# Patient Record
Sex: Female | Born: 1965 | Race: Black or African American | Hispanic: No | Marital: Single | Smoking: Never smoker
Health system: Southern US, Community
[De-identification: ages and names within clinical notes are randomized; demographics above are authoritative.]

## PROBLEM LIST (undated history)

## (undated) DIAGNOSIS — K219 Gastro-esophageal reflux disease without esophagitis: Secondary | ICD-10-CM

## (undated) DIAGNOSIS — I5042 Chronic combined systolic (congestive) and diastolic (congestive) heart failure: Secondary | ICD-10-CM

## (undated) DIAGNOSIS — I824Z9 Acute embolism and thrombosis of unspecified deep veins of unspecified distal lower extremity: Secondary | ICD-10-CM

## (undated) DIAGNOSIS — I428 Other cardiomyopathies: Secondary | ICD-10-CM

## (undated) DIAGNOSIS — J90 Pleural effusion, not elsewhere classified: Secondary | ICD-10-CM

## (undated) DIAGNOSIS — G5601 Carpal tunnel syndrome, right upper limb: Secondary | ICD-10-CM

## (undated) DIAGNOSIS — E785 Hyperlipidemia, unspecified: Secondary | ICD-10-CM

## (undated) DIAGNOSIS — J189 Pneumonia, unspecified organism: Secondary | ICD-10-CM

## (undated) DIAGNOSIS — I2699 Other pulmonary embolism without acute cor pulmonale: Secondary | ICD-10-CM

## (undated) DIAGNOSIS — I10 Essential (primary) hypertension: Secondary | ICD-10-CM

## (undated) DIAGNOSIS — I639 Cerebral infarction, unspecified: Secondary | ICD-10-CM

## (undated) DIAGNOSIS — E119 Type 2 diabetes mellitus without complications: Secondary | ICD-10-CM

## (undated) HISTORY — DX: Other cardiomyopathies: I42.8

## (undated) HISTORY — DX: Pleural effusion, not elsewhere classified: J90

## (undated) HISTORY — DX: Chronic combined systolic (congestive) and diastolic (congestive) heart failure: I50.42

## (undated) HISTORY — PX: LITHOTRIPSY: SUR834

## (undated) HISTORY — DX: Carpal tunnel syndrome, right upper limb: G56.01

---

## 2006-11-25 ENCOUNTER — Emergency Department: Payer: Self-pay | Admitting: Emergency Medicine

## 2007-03-20 ENCOUNTER — Inpatient Hospital Stay: Payer: Self-pay | Admitting: Surgery

## 2007-03-22 ENCOUNTER — Other Ambulatory Visit: Payer: Self-pay

## 2009-02-11 ENCOUNTER — Emergency Department: Payer: Self-pay | Admitting: Internal Medicine

## 2009-03-16 ENCOUNTER — Emergency Department: Payer: Self-pay | Admitting: Emergency Medicine

## 2010-01-13 ENCOUNTER — Emergency Department: Payer: Self-pay | Admitting: Emergency Medicine

## 2011-05-23 ENCOUNTER — Emergency Department: Payer: Self-pay | Admitting: *Deleted

## 2011-11-13 DIAGNOSIS — E669 Obesity, unspecified: Secondary | ICD-10-CM | POA: Insufficient documentation

## 2012-11-10 DIAGNOSIS — E7800 Pure hypercholesterolemia, unspecified: Secondary | ICD-10-CM | POA: Insufficient documentation

## 2012-12-02 ENCOUNTER — Emergency Department: Payer: Self-pay | Admitting: Emergency Medicine

## 2012-12-06 LAB — BETA STREP CULTURE(ARMC)

## 2013-04-25 ENCOUNTER — Emergency Department: Payer: Self-pay | Admitting: Emergency Medicine

## 2013-04-25 LAB — COMPREHENSIVE METABOLIC PANEL
Anion Gap: 20 — ABNORMAL HIGH (ref 7–16)
BUN: 17 mg/dL (ref 7–18)
Bilirubin,Total: 0.8 mg/dL (ref 0.2–1.0)
Chloride: 97 mmol/L — ABNORMAL LOW (ref 98–107)
EGFR (African American): >60
Osmolality: 276 (ref 275–301)
SGOT(AST): 35 U/L (ref 15–37)
Sodium: 129 mmol/L — ABNORMAL LOW (ref 136–145)

## 2013-04-25 LAB — CK TOTAL AND CKMB (NOT AT ARMC): CK, Total: 68 U/L (ref 21–215)

## 2013-04-25 LAB — TROPONIN I: Troponin-I: <0.02 ng/mL

## 2013-04-25 LAB — DRUG SCREEN, URINE
Amphetamines, Ur Screen: NEGATIVE (ref ?–1000)
Barbiturates, Ur Screen: NEGATIVE (ref ?–200)
Cannabinoid 50 Ng, Ur ~~LOC~~: NEGATIVE (ref ?–50)
Cocaine Metabolite,Ur ~~LOC~~: NEGATIVE (ref ?–300)
Methadone, Ur Screen: NEGATIVE (ref ?–300)
Opiate, Ur Screen: NEGATIVE (ref ?–300)
Tricyclic, Ur Screen: NEGATIVE (ref ?–1000)

## 2013-04-25 LAB — URINALYSIS, COMPLETE
Blood: NEGATIVE
Glucose,UR: >=500 mg/dL (ref 0–75)
Hyaline Cast: 1
Nitrite: NEGATIVE
Ph: 5 (ref 4.5–8.0)
RBC,UR: 1 /HPF (ref 0–5)
Squamous Epithelial: 1

## 2013-04-25 LAB — CBC
HCT: 51.2 % — ABNORMAL HIGH (ref 35.0–47.0)
MCHC: 32.5 g/dL (ref 32.0–36.0)
MCV: 88 fL (ref 80–100)
Platelet: 373 10*3/uL (ref 150–440)
RBC: 5.85 10*6/uL — ABNORMAL HIGH (ref 3.80–5.20)
RDW: 12.3 % (ref 11.5–14.5)

## 2013-04-26 ENCOUNTER — Encounter (HOSPITAL_COMMUNITY): Payer: Self-pay | Admitting: Internal Medicine

## 2013-04-26 ENCOUNTER — Inpatient Hospital Stay (HOSPITAL_COMMUNITY)
Admission: EM | Admit: 2013-04-26 | Discharge: 2013-04-29 | DRG: 637 | Disposition: A | Discharge status: Home / Self Care | Payer: Self-pay | Source: Other Acute Inpatient Hospital | Attending: Internal Medicine | Admitting: Internal Medicine

## 2013-04-26 DIAGNOSIS — I10 Essential (primary) hypertension: Secondary | ICD-10-CM | POA: Diagnosis present

## 2013-04-26 DIAGNOSIS — E1010 Type 1 diabetes mellitus with ketoacidosis without coma: Principal | ICD-10-CM | POA: Diagnosis present

## 2013-04-26 DIAGNOSIS — E1110 Type 2 diabetes mellitus with ketoacidosis without coma: Secondary | ICD-10-CM

## 2013-04-26 DIAGNOSIS — Z91199 Patient's noncompliance with other medical treatment and regimen due to unspecified reason: Secondary | ICD-10-CM

## 2013-04-26 DIAGNOSIS — E119 Type 2 diabetes mellitus without complications: Secondary | ICD-10-CM | POA: Diagnosis present

## 2013-04-26 DIAGNOSIS — E872 Acidosis, unspecified: Secondary | ICD-10-CM | POA: Diagnosis present

## 2013-04-26 DIAGNOSIS — E876 Hypokalemia: Secondary | ICD-10-CM | POA: Diagnosis present

## 2013-04-26 DIAGNOSIS — E873 Alkalosis: Secondary | ICD-10-CM | POA: Diagnosis present

## 2013-04-26 DIAGNOSIS — Z9119 Patient's noncompliance with other medical treatment and regimen: Secondary | ICD-10-CM

## 2013-04-26 DIAGNOSIS — D72829 Elevated white blood cell count, unspecified: Secondary | ICD-10-CM | POA: Diagnosis present

## 2013-04-26 DIAGNOSIS — G9340 Encephalopathy, unspecified: Secondary | ICD-10-CM | POA: Diagnosis present

## 2013-04-26 HISTORY — DX: Essential (primary) hypertension: I10

## 2013-04-26 HISTORY — DX: Gastro-esophageal reflux disease without esophagitis: K21.9

## 2013-04-26 HISTORY — DX: Type 2 diabetes mellitus without complications: E11.9

## 2013-04-26 LAB — GLUCOSE, CAPILLARY
Glucose-Capillary: 236 mg/dL — ABNORMAL HIGH (ref 70–99)
Glucose-Capillary: 236 mg/dL — ABNORMAL HIGH (ref 70–99)
Glucose-Capillary: 168 mg/dL — ABNORMAL HIGH (ref 70–99)
Glucose-Capillary: 178 mg/dL — ABNORMAL HIGH (ref 70–99)
Glucose-Capillary: 192 mg/dL — ABNORMAL HIGH (ref 70–99)
Glucose-Capillary: 116 mg/dL — ABNORMAL HIGH (ref 70–99)
Glucose-Capillary: 131 mg/dL — ABNORMAL HIGH (ref 70–99)
Glucose-Capillary: 130 mg/dL — ABNORMAL HIGH (ref 70–99)
Glucose-Capillary: 190 mg/dL — ABNORMAL HIGH (ref 70–99)
Glucose-Capillary: 325 mg/dL — ABNORMAL HIGH (ref 70–99)
Glucose-Capillary: 147 mg/dL — ABNORMAL HIGH (ref 70–99)
Glucose-Capillary: 122 mg/dL — ABNORMAL HIGH (ref 70–99)
Glucose-Capillary: 157 mg/dL — ABNORMAL HIGH (ref 70–99)

## 2013-04-26 LAB — BASIC METABOLIC PANEL
BUN: 17 mg/dL (ref 6–23)
BUN: 18 mg/dL (ref 6–23)
BUN: 17 mg/dL (ref 6–23)
BUN: 17 mg/dL (ref 6–23)
BUN: 16 mg/dL (ref 6–23)
CO2: 11 mEq/L — ABNORMAL LOW (ref 19–32)
CO2: 14 mEq/L — ABNORMAL LOW (ref 19–32)
CO2: 16 mEq/L — ABNORMAL LOW (ref 19–32)
Calcium: 8.5 mg/dL (ref 8.4–10.5)
Calcium: 9.3 mg/dL (ref 8.4–10.5)
Calcium: 8.5 mg/dL (ref 8.4–10.5)
Calcium: 8 mg/dL — ABNORMAL LOW (ref 8.4–10.5)
Calcium: 8.3 mg/dL — ABNORMAL LOW (ref 8.4–10.5)
Chloride: 103 mEq/L (ref 96–112)
Chloride: 106 mEq/L (ref 96–112)
Chloride: 104 mEq/L (ref 96–112)
Chloride: 98 mEq/L (ref 96–112)
Chloride: 98 mEq/L (ref 96–112)
Creatinine, Ser: 0.6 mg/dL (ref 0.50–1.10)
Creatinine, Ser: 0.64 mg/dL (ref 0.50–1.10)
Creatinine, Ser: 0.56 mg/dL (ref 0.50–1.10)
Creatinine, Ser: 0.62 mg/dL (ref 0.50–1.10)
GFR calc Af Amer: >90 mL/min (ref >90)
GFR calc Af Amer: >90 mL/min (ref >90)
GFR calc Af Amer: >90 mL/min (ref >90)
GFR calc Af Amer: >90 mL/min (ref >90)
GFR calc Af Amer: >90 mL/min (ref >90)
GFR calc non Af Amer: >90 mL/min (ref >90)
GFR calc non Af Amer: >90 mL/min (ref >90)
GFR calc non Af Amer: >90 mL/min (ref >90)
GFR calc non Af Amer: >90 mL/min (ref >90)
GFR calc non Af Amer: >90 mL/min (ref >90)
Glucose, Bld: 244 mg/dL — ABNORMAL HIGH (ref 70–99)
Glucose, Bld: 223 mg/dL — ABNORMAL HIGH (ref 70–99)
Glucose, Bld: 120 mg/dL — ABNORMAL HIGH (ref 70–99)
Glucose, Bld: 160 mg/dL — ABNORMAL HIGH (ref 70–99)
Glucose, Bld: 303 mg/dL — ABNORMAL HIGH (ref 70–99)
Potassium: 4.4 mEq/L (ref 3.5–5.1)
Potassium: 5 mEq/L (ref 3.5–5.1)
Potassium: 4.5 mEq/L (ref 3.5–5.1)
Potassium: 4 mEq/L (ref 3.5–5.1)
Sodium: 135 mEq/L (ref 135–145)
Sodium: 134 mEq/L — ABNORMAL LOW (ref 135–145)
Sodium: 131 mEq/L — ABNORMAL LOW (ref 135–145)
Sodium: 127 mEq/L — ABNORMAL LOW (ref 135–145)

## 2013-04-26 LAB — POCT I-STAT 3, VENOUS BLOOD GAS (G3P V)
Acid-base deficit: 15 mmol/L — ABNORMAL HIGH (ref 0.0–2.0)
Bicarbonate: 12.8 mEq/L — ABNORMAL LOW (ref 20.0–24.0)
O2 Saturation: 96 %
Patient temperature: 98.4
TCO2: 14 mmol/L (ref 0–100)
pCO2, Ven: 34.8 mmHg — ABNORMAL LOW (ref 45.0–50.0)
pH, Ven: 7.173 — CL (ref 7.250–7.300)
pO2, Ven: 105 mmHg — ABNORMAL HIGH (ref 30.0–45.0)

## 2013-04-26 LAB — TROPONIN I: Troponin I: <0.3 ng/mL (ref <0.30)

## 2013-04-26 LAB — CBC
HCT: 42 % (ref 36.0–46.0)
Hemoglobin: 14.5 g/dL (ref 12.0–15.0)
MCH: 29.2 pg (ref 26.0–34.0)
MCHC: 34.5 g/dL (ref 30.0–36.0)
MCV: 84.5 fL (ref 78.0–100.0)
RBC: 4.97 MIL/uL (ref 3.87–5.11)

## 2013-04-26 LAB — MRSA PCR SCREENING: MRSA by PCR: NEGATIVE

## 2013-04-26 MED ORDER — INFLUENZA VAC SPLIT QUAD 0.5 ML IM SUSP
0.5000 mL | INTRAMUSCULAR | Status: AC
  Administered 2013-04-29: 0.5 mL via INTRAMUSCULAR
  Filled 2013-04-26: qty 0.5

## 2013-04-26 MED ORDER — POTASSIUM CHLORIDE CRYS ER 10 MEQ PO TBCR
EXTENDED_RELEASE_TABLET | ORAL | Status: AC
  Administered 2013-04-26: 40 meq
  Filled 2013-04-26: qty 4

## 2013-04-26 MED ORDER — DEXTROSE 50 % IV SOLN: 25.0000 mL | INTRAVENOUS | Status: DC | PRN

## 2013-04-26 MED ORDER — DEXTROSE 5 % IV SOLN
INTRAVENOUS | Status: DC
  Administered 2013-04-27 (×2): via INTRAVENOUS

## 2013-04-26 MED ORDER — SODIUM CHLORIDE 0.9 % IV SOLN: INTRAVENOUS | Status: DC

## 2013-04-26 MED ORDER — BIOTENE DRY MOUTH MT LIQD
15.0000 mL | Freq: Two times a day (BID) | OROMUCOSAL | Status: DC
  Administered 2013-04-26 – 2013-04-27 (×3): 15 mL via OROMUCOSAL

## 2013-04-26 MED ORDER — POTASSIUM CHLORIDE CRYS ER 10 MEQ PO TBCR
EXTENDED_RELEASE_TABLET | ORAL | Status: AC
  Filled 2013-04-26: qty 4

## 2013-04-26 MED ORDER — SODIUM CHLORIDE 0.9 % IV SOLN
INTRAVENOUS | Status: DC
  Administered 2013-04-26: 1.4 [IU]/h via INTRAVENOUS
  Administered 2013-04-26: 0.8 [IU]/h via INTRAVENOUS
  Administered 2013-04-26: 1.4 [IU]/h via INTRAVENOUS
  Administered 2013-04-26: 0.9 [IU]/h via INTRAVENOUS
  Administered 2013-04-27: 3.3 [IU]/h via INTRAVENOUS
  Administered 2013-04-27: 3.8 [IU]/h via INTRAVENOUS
  Administered 2013-04-27: 1.3 [IU]/h via INTRAVENOUS
  Administered 2013-04-27: 4.4 [IU]/h via INTRAVENOUS
  Administered 2013-04-27: 0.9 [IU]/h via INTRAVENOUS
  Administered 2013-04-27: 0.5 [IU]/h via INTRAVENOUS
  Filled 2013-04-26: qty 1

## 2013-04-26 MED ORDER — INSULIN GLARGINE 100 UNIT/ML ~~LOC~~ SOLN
10.0000 [IU] | Freq: Every day | SUBCUTANEOUS | Status: DC
  Administered 2013-04-26: 10 [IU] via SUBCUTANEOUS
  Filled 2013-04-26: qty 0.1

## 2013-04-26 MED ORDER — POTASSIUM CHLORIDE CRYS ER 20 MEQ PO TBCR
40.0000 meq | EXTENDED_RELEASE_TABLET | Freq: Once | ORAL | Status: DC
  Filled 2013-04-26: qty 2

## 2013-04-26 MED ORDER — SODIUM CHLORIDE 0.45 % IV SOLN
INTRAVENOUS | Status: DC
  Administered 2013-04-26: 12:00:00 via INTRAVENOUS

## 2013-04-26 MED ORDER — PNEUMOCOCCAL VAC POLYVALENT 25 MCG/0.5ML IJ INJ
0.5000 mL | INJECTION | INTRAMUSCULAR | Status: AC
  Administered 2013-04-29: 0.5 mL via INTRAMUSCULAR
  Filled 2013-04-26: qty 0.5

## 2013-04-26 MED ORDER — HYDRALAZINE HCL 20 MG/ML IJ SOLN: 5.0000 mg | Freq: Four times a day (QID) | INTRAMUSCULAR | Status: DC | PRN

## 2013-04-26 MED ORDER — DEXTROSE-NACL 5-0.45 % IV SOLN
INTRAVENOUS | Status: DC
  Administered 2013-04-26: 75 mL/h via INTRAVENOUS

## 2013-04-26 MED ORDER — LIVING WELL WITH DIABETES BOOK
Freq: Once | Status: AC
  Administered 2013-04-26: 13:00:00
  Filled 2013-04-26: qty 1

## 2013-04-26 MED ORDER — CHLORHEXIDINE GLUCONATE 0.12 % MT SOLN
15.0000 mL | Freq: Two times a day (BID) | OROMUCOSAL | Status: DC
  Administered 2013-04-26 – 2013-04-27 (×3): 15 mL via OROMUCOSAL
  Filled 2013-04-26 (×3): qty 15

## 2013-04-26 MED ORDER — HEPARIN SODIUM (PORCINE) 5000 UNIT/ML IJ SOLN
5000.0000 [IU] | Freq: Three times a day (TID) | INTRAMUSCULAR | Status: DC
  Administered 2013-04-26 – 2013-04-29 (×10): 5000 [IU] via SUBCUTANEOUS
  Filled 2013-04-26 (×13): qty 1

## 2013-04-26 MED ORDER — INSULIN ASPART 100 UNIT/ML ~~LOC~~ SOLN
0.0000 [IU] | SUBCUTANEOUS | Status: DC
  Administered 2013-04-26: 2 [IU] via SUBCUTANEOUS
  Administered 2013-04-26: 11 [IU] via SUBCUTANEOUS

## 2013-04-26 NOTE — Progress Notes (Signed)
CBG increased to 325 mg/dl for 1610. Given Novalog 11 units SQ for coverage per orders. Called results of 1600 BMET and CBG to Dr. Marin Shutter. Orders received. Insulin drip restarted.

## 2013-04-26 NOTE — Progress Notes (Signed)
eLink Physician-Brief Progress Note Patient Name: Minaal Struckman DOB: 1965-08-12 MRN: [id number]  Date of Service  04/26/2013   HPI/Events of Note   BG>300 Bicarb 15 Suspect continuing ketosis   eICU Interventions   Back in insulin gtt CBG q1 BMP q4 Send ketones before transitioning    Intervention Category Major Interventions: Hyperglycemia - active titration of insulin therapy  Emric Kowalewski 04/26/2013, 5:07 PM

## 2013-04-26 NOTE — H&P (Signed)
Name: Erin Hayes MRN: [id number] DOB: 06-Feb-1966    LOS: 0  Referring Provider:  [facility] Reason for Referral:  DKA  PULMONARY / CRITICAL CARE MEDICINE  BRIEF:  Ms. Erin Hayes is a 47 y/o woman with past medical history of diabetes and hypertension who presented to [facility] hospital with a headache.  She had runout of her medications including antihypertensives and insulin.  She was treated with morphine, benadryl and toradol for headache when it was noted that her blood sugar was 370 and her anion gap was 20.  Her venous pH at that time was 7.12.  She was then started on an insulin drip and transferred to [facility] hospital as no ICU beds were available at [facility].    SUBJECTIVE/OVERNIGHT/INTERVAL HX  04/26/13: Transitioned to Lantus. INsulin gtt to run out in few hours.     Physical Examination: General:  Small woman laying in bed in NAD Neuro:  Alert, oriented to person, somnolent but awakens to voice HEENT:  PERRL, EOMI, OP clear Neck:  No lesions Cardiovascular:  NRRR, no mrg Lungs:  CTAB, no wrr Abdomen:   Soft, NTND, +BS Musculoskeletal:  No joint abnormalities Skin:  No lesions   LABS: PULMONARY  Recent Labs Lab 04/26/13 0253  HCO3 12.8*  TCO2 14  O2SAT 96.0    CBC  Recent Labs Lab 04/26/13 0214  HGB 14.5  HCT 42.0  WBC 19.3*  PLT 171    COAGULATION No results found for this basename: INR,  in the last 168 hours  CARDIAC  No results found for this basename: TROPONINI,  in the last 168 hours No results found for this basename: PROBNP,  in the last 168 hours   CHEMISTRY  Recent Labs Lab 04/26/13 0214 04/26/13 0448 04/26/13 0925 04/26/13 1230  NA 134* 135 134* 131*  K 4.4 4.3 5.0 4.5  CL 103 106 104 104  CO2 11* 14* 16* 11*  GLUCOSE 244* 223* 120* 160*  BUN 17 18 17 17   CREATININE 0.60 0.64 0.55 0.55  CALCIUM 8.4 8.5 9.3 8.5   Estimated Creatinine Clearance: 58 ml/min (by C-G formula based on Cr of 0.55).   LIVER No  results found for this basename: AST, ALT, ALKPHOS, BILITOT, PROT, ALBUMIN, INR,  in the last 168 hours   INFECTIOUS No results found for this basename: LATICACIDVEN, PROCALCITON,  in the last 168 hours   ENDOCRINE CBG (last 3)   Recent Labs  04/26/13 0855 04/26/13 0956 04/26/13 1100  GLUCAP 116* 127* 131*         IMAGING x48h  No results found.     ASSESSMENT / PLAN:  PULMONARY A: no distress P:   Monitor IS  CARDIOVASCULAR A: AT risk for NSTEMI P:  Cycle enzumes  RENAL A:  At risk for renal faire P:   monitor  GASTROINTESTINAL A:  Ready to eat per DKA protocol P:   Carb modified diet  HEMATOLOGIC A:  Normal P:  monitor  INFECTIOUS A:  No infection P:   monitor  ENDOCRINE A:  DKA severe needing ICU admit due to lack of meds due to $ issues 04/26/13  - improved   P:   DKA protocol ; transition to sq Social work consult  NEUROLOGIC A:  Acute enceph on arrival 12.21.14  normal P:   monitor  SOCIAL : disabled son. Daughter works. She has no $. Social work conslt called     Dr. Kalman Shan, M.D., F.C.C.P Pulmonary and Critical Care Medicine  Staff Physician [facility]  Pulmonary and Critical Care Pager: [phone], If no answer or between  15:00h - 7:00h: call [phone]  04/26/2013 1:52 PM

## 2013-04-26 NOTE — Progress Notes (Signed)
Attempted to complete Pt H&P however Pt very obtunded and difficult to maintain awake, follows commands after many prompts, CBG's trending down, will continue monitor and assess.

## 2013-04-26 NOTE — Plan of Care (Signed)
Problem: Phase I Progression Outcomes Goal: CBGs steadily decreasing on IV insulin drip Outcome: Progressing Progressing earlier and transitioned off drip. Now up to 325. Calling MD to see if they want to change orders. Treated with SQ insulin Goal: Pt. states reason for hospitalization Outcome: Completed/Met Date Met:  04/26/13 Could not afford medication. Chose to pay rent. Social work and care management consults made Goal: Order "Choose to Live" book from Pharmacy Outcome: Completed/Met Date Met:  04/26/13 Living with Diabetes booklet given to patient

## 2013-04-26 NOTE — Progress Notes (Signed)
Dr. Tyson Alias notified of 0930 BMET results. Orders received.

## 2013-04-26 NOTE — Progress Notes (Signed)
Pt admitted from [facility] via [facility] transporters on stretcher w/  monitor and RN, after Pt report received from Thurston Hole, RN at [facility] ED; Pt assisted to hospital bed and attached to unit monitors; Pt introduced to staff and unit, MRSA swab done and 6 cloth CHG bath complete. Questions answered and support give,CCM MD at bedside, updated on current CBG reading and obtunded mentation. Will continue to monitor and assess.

## 2013-04-26 NOTE — H&P (Signed)
Name: Erin Hayes MRN: [id number] DOB: 15-Sep-1965    LOS: 0  Referring Provider:  [facility] Reason for Referral:  DKA  PULMONARY / CRITICAL CARE MEDICINE  HPI:  Erin Hayes is a 47 y/o woman with past medical history of diabetes and hypertension who presented to [facility] hospital with a headache.  She had run out of her medications including antihypertensives and insulin.  She was treated with morphine, benadryl and toradol for headache when it was noted that her blood sugar was 370 and her anion gap was 20.  Her venous pH at that time was 7.12.  She was then started on an insulin drip and transferred to [facility] hospital as no ICU beds were available at [facility].   Past Medical History  Diagnosis Date  . Diabetes   . Hypertension    No past surgical history on file. Prior to Admission medications   Not on File   Allergies No Known Allergies  Family History Pt unable to provide family history at this time due to altered mental status  Social History Pt unable to provide social history at this time due to altered mental status  Review Of Systems:  Denies any recent history of fever, chills, n/v/d or cough  Brief patient description:  47 y/o with DKA secondary to no medications  Events Since Admission: Started on glucose stabilizer  Current Status: guarded  Vital Signs: HR 109 RR 22 BP 147/85 T 37  Physical Examination: General:  Small woman laying in bed in NAD Neuro:  Alert, oriented to person, somnolent but awakens to voice HEENT:  PERRL, EOMI, OP clear Neck:  No lesions Cardiovascular:  NRRR, no mrg Lungs:  CTAB, no wrr Abdomen:   Soft, NTND, +BS Musculoskeletal:  No joint abnormalities Skin:  No lesions  Principal Problem:   DKA (diabetic ketoacidoses)   ASSESSMENT AND PLAN  PULMONARY No results found for this basename: PHART, PCO2, PCO2ART, PO2ART, HCO3, O2SAT,  in the last 168 hours Ventilator Settings:   CXR - no infiltrate or evidence  of other acute pulmonary process  A:  Metabolic acidosis with compensatory respiratory alkalosis P:   Continuous O2 sat Repeat VBG on admission to follow pH  CARDIOVASCULAR No results found for this basename: TROPONINI, LATICACIDVEN,  O2SATVEN, PROBNP,  in the last 168 hours ECG:  NSR Lines: PIV  A: No current problems P:  Continuous telemetry  RENAL No results found for this basename: NA, K, CL, CO2, BUN, CREATININE, CALCIUM, MG, PHOS,  in the last 168 hours Intake/Output   None     A:  Hypertension P:   Pt on antihypertensives at home but has been out for over a month Hydralizine for SBP>180 Restart oral meds when taking PO  GASTROINTESTINAL No results found for this basename: AST, ALT, ALKPHOS, BILITOT, PROT, ALBUMIN,  in the last 168 hours  A:  NPO P:   NPO until off insulin drip  HEMATOLOGIC No results found for this basename: HGB, HCT, PLT, INR, APTT,  in the last 168 hours A:  At risk for DVT P:  Prophylactic heparin, SCDs  INFECTIOUS No results found for this basename: WBC, PROCALCITON,  in the last 168 hours Cultures: none Antibiotics: none  A:  No signs or symptoms of infection at this time P:   Monitor for fever and increased WBC count  ENDOCRINE No results found for this basename: GLUCAP,  in the last 168 hours A:  DKA   P:   Insulin drip  per protocol NS at 75/hr while glucose greater than 250 D51/2 NS when glucose less than 250 q2hr BMP  NEUROLOGIC  A:  AMS P:   S/p head CT normal  Treated with multiple doses morphine and benadryl, AMS likely due to DKA and medications  BEST PRACTICE / DISPOSITION Level of Care:  ICU Primary Service:  PCCM Consultants:  none Code Status:  full Diet:  NPO DVT Px:  heparin GI Px:  Not indicated  Skin Integrity:  good Social / Family:  None at bedside  I spent 35 minutes of critical care time in the care of this patient seperate from procedures which are documented elsewhere   Carolan Clines., M.D. Pulmonary and Critical Care Medicine [facility] Pager: [phone]  04/26/2013, 1:50 AM

## 2013-04-26 NOTE — Progress Notes (Signed)
CRITICAL VALUE ALERT  Critical value received pH 7.17  Date of notification: t  Time of notification: 84  Critical value read back:no, result off I-stat test  Nurse who received alert: Aura Camps  MD notified (1st page):  Dr Craige Cotta  Time of first page:  0258  MD notified (2nd page):  Time of second page:  Responding MD:  Dr Craige Cotta  Time MD responded:  [phone]

## 2013-04-27 ENCOUNTER — Encounter (HOSPITAL_COMMUNITY): Payer: Self-pay | Admitting: Certified Registered Nurse Anesthetist

## 2013-04-27 DIAGNOSIS — E119 Type 2 diabetes mellitus without complications: Secondary | ICD-10-CM

## 2013-04-27 LAB — BASIC METABOLIC PANEL
BUN: 11 mg/dL (ref 6–23)
BUN: 10 mg/dL (ref 6–23)
BUN: 14 mg/dL (ref 6–23)
CO2: 19 mEq/L (ref 19–32)
CO2: 16 mEq/L — ABNORMAL LOW (ref 19–32)
CO2: 18 mEq/L — ABNORMAL LOW (ref 19–32)
Calcium: 8.9 mg/dL (ref 8.4–10.5)
Calcium: 8.7 mg/dL (ref 8.4–10.5)
Chloride: 100 mEq/L (ref 96–112)
Chloride: 100 mEq/L (ref 96–112)
Chloride: 96 mEq/L (ref 96–112)
Creatinine, Ser: 0.64 mg/dL (ref 0.50–1.10)
Creatinine, Ser: 0.56 mg/dL (ref 0.50–1.10)
Creatinine, Ser: 0.87 mg/dL (ref 0.50–1.10)
GFR calc Af Amer: >90 mL/min (ref >90)
GFR calc Af Amer: >90 mL/min (ref >90)
GFR calc Af Amer: 89 mL/min — ABNORMAL LOW (ref >90)
GFR calc non Af Amer: >90 mL/min (ref >90)
GFR calc non Af Amer: >90 mL/min (ref >90)
GFR calc non Af Amer: 77 mL/min — ABNORMAL LOW (ref >90)
Glucose, Bld: 107 mg/dL — ABNORMAL HIGH (ref 70–99)
Glucose, Bld: 162 mg/dL — ABNORMAL HIGH (ref 70–99)
Potassium: 3.4 mEq/L — ABNORMAL LOW (ref 3.5–5.1)
Potassium: 3.6 mEq/L (ref 3.5–5.1)
Sodium: 129 mEq/L — ABNORMAL LOW (ref 135–145)
Sodium: 127 mEq/L — ABNORMAL LOW (ref 135–145)

## 2013-04-27 LAB — PHOSPHORUS: Phosphorus: 1.6 mg/dL — ABNORMAL LOW (ref 2.3–4.6)

## 2013-04-27 LAB — GLUCOSE, CAPILLARY
Glucose-Capillary: 127 mg/dL — ABNORMAL HIGH (ref 70–99)
Glucose-Capillary: 107 mg/dL — ABNORMAL HIGH (ref 70–99)
Glucose-Capillary: 149 mg/dL — ABNORMAL HIGH (ref 70–99)
Glucose-Capillary: 161 mg/dL — ABNORMAL HIGH (ref 70–99)
Glucose-Capillary: 234 mg/dL — ABNORMAL HIGH (ref 70–99)
Glucose-Capillary: 128 mg/dL — ABNORMAL HIGH (ref 70–99)
Glucose-Capillary: 238 mg/dL — ABNORMAL HIGH (ref 70–99)

## 2013-04-27 LAB — TROPONIN I: Troponin I: <0.3 ng/mL (ref <0.30)

## 2013-04-27 LAB — MAGNESIUM: Magnesium: 1.6 mg/dL (ref 1.5–2.5)

## 2013-04-27 MED ORDER — INSULIN ASPART PROT & ASPART (70-30 MIX) 100 UNIT/ML ~~LOC~~ SUSP
35.0000 [IU] | Freq: Two times a day (BID) | SUBCUTANEOUS | Status: DC
  Administered 2013-04-27 – 2013-04-29 (×5): 35 [IU] via SUBCUTANEOUS
  Filled 2013-04-27: qty 10

## 2013-04-27 MED ORDER — INSULIN GLARGINE 100 UNIT/ML ~~LOC~~ SOLN
35.0000 [IU] | Freq: Two times a day (BID) | SUBCUTANEOUS | Status: DC
  Administered 2013-04-27: 35 [IU] via SUBCUTANEOUS
  Filled 2013-04-27 (×2): qty 0.35

## 2013-04-27 MED ORDER — INSULIN ASPART 100 UNIT/ML ~~LOC~~ SOLN
10.0000 [IU] | Freq: Three times a day (TID) | SUBCUTANEOUS | Status: DC
  Administered 2013-04-27: 10 [IU] via SUBCUTANEOUS

## 2013-04-27 MED ORDER — INSULIN ASPART 100 UNIT/ML ~~LOC~~ SOLN
0.0000 [IU] | SUBCUTANEOUS | Status: DC
Start: 2013-04-27 — End: 2013-04-28
  Administered 2013-04-27: 3 [IU] via SUBCUTANEOUS
  Administered 2013-04-27: 7 [IU] via SUBCUTANEOUS
  Administered 2013-04-27: 11 [IU] via SUBCUTANEOUS
  Administered 2013-04-28: 3 [IU] via SUBCUTANEOUS
  Administered 2013-04-28: 7 [IU] via SUBCUTANEOUS
  Administered 2013-04-28: 15 [IU] via SUBCUTANEOUS

## 2013-04-27 NOTE — Progress Notes (Signed)
Pt transferred to 6N08 via wheelchair. Pt on RA, no telemetry. A/O x 4. No complaints per pt. Pt belongings bag transferred with pt containing pt's clothes. Pt's daughter aware of transfer. Bedside handoff with Lillia Abed, RN. Will continue to monitor closely. Kerin Ransom, RN

## 2013-04-27 NOTE — Progress Notes (Signed)
Nutrition Brief Note  Patient identified on the Malnutrition Screening Tool (MST) Report for recent weight lost without trying.  Patient reports her weight loss has been due to a combination of lack of DM medications as well as her actively trying to lose weight.  Wt Readings from Last 15 Encounters:  04/27/13 101 lb 1.6 oz (45.859 kg)    Body mass index is 20.41 kg/(m^2). Patient meets criteria for Normal based on current BMI.   Current diet order is Carbohydrate Modified Medium Calorie, patient is consuming approximately 90% of meals at this time. Labs and medications reviewed.   No nutrition interventions warranted at this time. If nutrition issues arise, please consult RD.   Maureen Chatters, RD, LDN Pager #: [phone] After-Hours Pager #: [phone]

## 2013-04-27 NOTE — Progress Notes (Signed)
Inpatient Diabetes Program Recommendations  AACE/ADA: New Consensus Statement on Inpatient Glycemic Control (2013)  Target Ranges:  Prepandial:   less than 140 mg/dL      Peak postprandial:   less than 180 mg/dL (1-2 hours)      Critically ill patients:  140 - 180 mg/dL   Reason for Assessment: DKA Admission.  Question about Lantus order.  Note: Telephone call to Dr. Shan Levans regarding Lantus order.  Dr. Delford Field decided to stop the Lantus 35 unit BID order and Novolog 10 unit tid meal coverage order and put patient on 70/30 35 units bid with breakfast and supper as she was supposed to be taking prior to admission.  Orders changed per Dr. Lynelle Doctor request to reflect this change in treatment.  Thank you.  Khriz Liddy S. Elsie Lincoln, RN, CNS, CDE Inpatient Diabetes Program, team pager [phone]

## 2013-04-27 NOTE — Progress Notes (Signed)
CSW received consult for neglect and assistance with insulin. CSW spoke to RN, who stated patient needs medication assistance and is not able to take care of herself medically, CSW passed this information on to the Case Manager who will follow up with medication assistance. CSW signing off at this time. Please re consult if social work needs arise.  Maree Krabbe, MSW, Theresia Majors [phone]

## 2013-04-27 NOTE — H&P (Addendum)
Name: Erin Hayes MRN: [id number] DOB: Oct 05, 1965    LOS: 1  Referring Provider:  [facility] Reason for Referral:  DKA  PULMONARY / CRITICAL CARE MEDICINE  BRIEF:  [name] is a 47 y/o woman with past medical history of diabetes and hypertension who presented to [facility] with a headache.  She had runout of her medications including antihypertensives and insulin.  She was treated with morphine, benadryl and toradol for headache when it was noted that her blood sugar was 370 and her anion gap was 20.  Her venous pH at that time was 7.12.  She was then started on an insulin drip and transferred to [facility] hospital as no ICU beds were available at [facility].    SUBJECTIVE/OVERNIGHT/INTERVAL HX improved   Physical Examination: General:  Small woman laying in bed in NAD Neuro:  Alert, oriented to person, somnolent but awakens to voice HEENT:  PERRL, EOMI, OP clear Neck:  No lesions Cardiovascular:  NRRR, no mrg Lungs:  CTAB, no wrr Abdomen:   Soft, NTND, +BS Musculoskeletal:  No joint abnormalities Skin:  No lesions   LABS: PULMONARY  Recent Labs Lab 04/26/13 0253  HCO3 12.8*  TCO2 14  O2SAT 96.0    CBC  Recent Labs Lab 04/26/13 0214  HGB 14.5  HCT 42.0  WBC 19.3*  PLT 171    COAGULATION No results found for this basename: INR,  in the last 168 hours  CARDIAC    Recent Labs Lab 04/26/13 1543 04/26/13 2200 04/27/13 0400  TROPONINI <0.30 <0.30 <0.30   No results found for this basename: PROBNP,  in the last 168 hours   CHEMISTRY  Recent Labs Lab 04/26/13 1542 04/26/13 1920 04/26/13 2200 04/27/13 0400 04/27/13 0830  NA 125* 126* 127* 129* 127*  K 4.0 3.1* 3.4* 3.4* 3.6  CL 98 98 98 100 100  CO2 15* 17* 17* 19 16*  GLUCOSE 303* 174* 162* 107* 162*  BUN 17 16 14 11 10   CREATININE 0.56 0.62 0.64 0.64 0.56  CALCIUM 7.9* 8.0* 8.3* 8.9 8.7  MG  --   --   --  1.6  --   PHOS  --   --   --  1.6*  --    Estimated Creatinine  Clearance: 58 ml/min (by C-G formula based on Cr of 0.56).   LIVER No results found for this basename: AST, ALT, ALKPHOS, BILITOT, PROT, ALBUMIN, INR,  in the last 168 hours   INFECTIOUS No results found for this basename: LATICACIDVEN, PROCALCITON,  in the last 168 hours   ENDOCRINE CBG (last 3)   Recent Labs  04/26/13 2121 04/26/13 2213 04/26/13 2311  GLUCAP 122* 157* 196*         IMAGING x48h  No results found.     ASSESSMENT / PLAN:  PULMONARY A: no distress P:   Monitor IS  CARDIOVASCULAR A: AT risk for NSTEMI P:  Neg enzymes  RENAL A:  At risk for renal faire P:   monitor  GASTROINTESTINAL A:  Ready to eat per DKA protocol P:   Carb modified diet  HEMATOLOGIC A:  Normal P:  monitor  INFECTIOUS A:  No infection P:   monitor  ENDOCRINE A:  DKA severe needing ICU admit due to lack of meds due to $ issues 04/26/13  -resolved  P:   DKA protocol ; transition to sq Social work consult  NEUROLOGIC A:  Acute enceph on arrival 12.21.14  normal P:   monitor  SOCIAL : disabled son. Daughter works. She has no $. Social work Arts administrator to floor.  [facility] to assume care in AM 12/23   Erin Hayes  Beeper  [phone]  Cell  [phone]  If no response or cell goes to voicemail, call beeper [phone]   04/27/2013 9:25 AM

## 2013-04-28 DIAGNOSIS — D72829 Elevated white blood cell count, unspecified: Secondary | ICD-10-CM

## 2013-04-28 DIAGNOSIS — E876 Hypokalemia: Secondary | ICD-10-CM

## 2013-04-28 DIAGNOSIS — G9340 Encephalopathy, unspecified: Secondary | ICD-10-CM

## 2013-04-28 LAB — CBC WITH DIFFERENTIAL/PLATELET
Basophils Relative: 1 % (ref 0–1)
Eosinophils Absolute: 0.1 10*3/uL (ref 0.0–0.7)
Eosinophils Relative: 1 % (ref 0–5)
Hemoglobin: 14.4 g/dL (ref 12.0–15.0)
Lymphs Abs: 2.4 10*3/uL (ref 0.7–4.0)
MCH: 29 pg (ref 26.0–34.0)
MCHC: 35 g/dL (ref 30.0–36.0)
MCV: 82.9 fL (ref 78.0–100.0)
Monocytes Relative: 10 % (ref 3–12)
Neutro Abs: 3.5 10*3/uL (ref 1.7–7.7)
Neutrophils Relative %: 53 % (ref 43–77)
RBC: 4.96 MIL/uL (ref 3.87–5.11)
RDW: 12.2 % (ref 11.5–15.5)
WBC: 6.6 10*3/uL (ref 4.0–10.5)

## 2013-04-28 LAB — GLUCOSE, CAPILLARY
Glucose-Capillary: 105 mg/dL — ABNORMAL HIGH (ref 70–99)
Glucose-Capillary: 149 mg/dL — ABNORMAL HIGH (ref 70–99)
Glucose-Capillary: 210 mg/dL — ABNORMAL HIGH (ref 70–99)
Glucose-Capillary: 317 mg/dL — ABNORMAL HIGH (ref 70–99)

## 2013-04-28 LAB — BASIC METABOLIC PANEL
BUN: 12 mg/dL (ref 6–23)
CO2: 21 mEq/L (ref 19–32)
Calcium: 8.9 mg/dL (ref 8.4–10.5)
Creatinine, Ser: 0.69 mg/dL (ref 0.50–1.10)
GFR calc non Af Amer: >90 mL/min (ref >90)
Glucose, Bld: 98 mg/dL (ref 70–99)
Potassium: 3 mEq/L — ABNORMAL LOW (ref 3.5–5.1)
Sodium: 136 mEq/L (ref 135–145)

## 2013-04-28 MED ORDER — LISINOPRIL 20 MG PO TABS
20.0000 mg | ORAL_TABLET | Freq: Every day | ORAL | Status: DC
  Administered 2013-04-28 – 2013-04-29 (×2): 20 mg via ORAL
  Filled 2013-04-28 (×2): qty 1

## 2013-04-28 MED ORDER — POTASSIUM CHLORIDE CRYS ER 20 MEQ PO TBCR
40.0000 meq | EXTENDED_RELEASE_TABLET | ORAL | Status: AC
  Administered 2013-04-28 (×2): 40 meq via ORAL
  Filled 2013-04-28 (×2): qty 2

## 2013-04-28 MED ORDER — INSULIN ASPART 100 UNIT/ML ~~LOC~~ SOLN
0.0000 [IU] | Freq: Three times a day (TID) | SUBCUTANEOUS | Status: DC
  Administered 2013-04-29 (×2): 5 [IU] via SUBCUTANEOUS

## 2013-04-28 MED ORDER — INSULIN ASPART 100 UNIT/ML ~~LOC~~ SOLN
0.0000 [IU] | Freq: Every day | SUBCUTANEOUS | Status: DC
Start: 2013-04-28 — End: 2013-04-29

## 2013-04-28 MED ORDER — MAGNESIUM SULFATE 50 % IJ SOLN
3.0000 g | Freq: Once | INTRAVENOUS | Status: AC
  Administered 2013-04-28: 3 g via INTRAVENOUS
  Filled 2013-04-28: qty 6

## 2013-04-28 NOTE — Progress Notes (Signed)
[facility] PROGRESS NOTE  Erin Hayes ZOX:[id number] DOB: 06/18/1965 DOA: 04/26/2013 PCP: No PCP Per Patient  Assessment/Plan: #1 diabetic ketoacidosis Likely secondary to patient being unable to get her medications secondary to insurance issues. Anion gap is closed. Patient has been transitioned to subcutaneous insulin. Clinically improved.  Normal saline lock IV fluids. Sliding scale insulin. Social work consulted to help patient be able to get her medications. Follow.  #2 hypokalemia Replete.  #3 uncontrolled diabetes mellitus Hemoglobin A1c is 18.3. Likely secondary to noncompliance as patient states was unable to get her medications secondary to insurance issues. Diabetic coordinator has been consulted and is following. Continue 70/30. Sliding scale insulin. Social work has been consulted to aid patient with medications.  #4 leukocytosis Likely reactive in nature. Resolved.  #5 hypertension Resume lisinopril. HCTZ on hold.  #5 acute encephalopathy Likely secondary to problem #1. Resolved.  Code Status: full Family Communication: updated patient at bedside. No family present. Disposition Plan: home when medically stable hopefully tomorrow   Consultants:  PCCM  Procedures:  none  Antibiotics:  none  HPI/Subjective: Patient denies any nausea, no emesis. Patient tolerating current diet. No complaints.  Objective: Filed Vitals:   04/28/13 1343  BP: 121/85  Pulse: 90  Temp: 98.7 F (37.1 C)  Resp: 18    Intake/Output Summary (Last 24 hours) at 04/28/13 1915 Last data filed at 04/28/13 1343  Gross per 24 hour  Intake   1390 ml  Output   1150 ml  Net    240 ml   Filed Weights   04/26/13 0145 04/26/13 1254 04/27/13 0400  Weight: 43.7 kg (96 lb 5.5 oz) 43.7 kg (96 lb 5.5 oz) 45.859 kg (101 lb 1.6 oz)    Exam:   General:  NAD  Cardiovascular: RRR  Respiratory: CTAB  Abdomen: soft, nontender, nondistended, positive bowel  sounds.  Musculoskeletal: no clubbing cyanosis or edema.  Data Reviewed: Basic Metabolic Panel:  Recent Labs Lab 04/26/13 2200 04/27/13 0400 04/27/13 0830 04/27/13 1940 04/28/13 0816  NA 127* 129* 127* 128* 136  K 3.4* 3.4* 3.6 3.2* 3.0*  CL 98 100 100 96 103  CO2 17* 19 16* 18* 21  GLUCOSE 162* 107* 162* 238* 98  BUN 14 11 10 14 12   CREATININE 0.64 0.64 0.56 0.87 0.69  CALCIUM 8.3* 8.9 8.7 8.4 8.9  MG  --  1.6  --   --  1.7  PHOS  --  1.6*  --   --  3.6   Liver Function Tests: No results found for this basename: AST, ALT, ALKPHOS, BILITOT, PROT, ALBUMIN,  in the last 168 hours No results found for this basename: LIPASE, AMYLASE,  in the last 168 hours No results found for this basename: AMMONIA,  in the last 168 hours CBC:  Recent Labs Lab 04/26/13 0214 04/28/13 0816  WBC 19.3* 6.6  NEUTROABS  --  3.5  HGB 14.5 14.4  HCT 42.0 41.1  MCV 84.5 82.9  PLT 171 202   Cardiac Enzymes:  Recent Labs Lab 04/26/13 1543 04/26/13 2200 04/27/13 0400  TROPONINI <0.30 <0.30 <0.30   BNP (last 3 results) No results found for this basename: PROBNP,  in the last 8760 hours CBG:  Recent Labs Lab 04/28/13 0011 04/28/13 0408 04/28/13 0749 04/28/13 1115 04/28/13 1631  GLUCAP 210* 105* 97 317* 125*    Recent Results (from the past 240 hour(s))  MRSA PCR SCREENING     Status: None   Collection Time    04/26/13  1:51 AM      Result Value Range Status   MRSA by PCR NEGATIVE  NEGATIVE Final   Comment:            The GeneXpert MRSA Assay (FDA     approved for NASAL specimens     only), is one component of a     comprehensive MRSA colonization     surveillance program. It is not     intended to diagnose MRSA     infection nor to guide or     monitor treatment for     MRSA infections.     Studies: No results found.  Scheduled Meds: . heparin  5,000 Units Subcutaneous Q8H  . influenza vac split quadrivalent PF  0.5 mL Intramuscular Tomorrow-1000  . [START ON  04/29/2013] insulin aspart  0-15 Units Subcutaneous TID WC  . insulin aspart  0-5 Units Subcutaneous QHS  . insulin aspart protamine- aspart  35 Units Subcutaneous BID WC  . pneumococcal 23 valent vaccine  0.5 mL Intramuscular Tomorrow-1000  . potassium chloride  40 mEq Oral Once   Continuous Infusions:   Principal Problem:   DKA (diabetic ketoacidoses) Active Problems:   Diabetes   Hypertension   Hypokalemia   Leukocytosis, unspecified   Encephalopathy    Time spent: 30 minutes    THOMPSON,DANIEL M.D. [facility] Pager [phone]. If 7PM-7AM, please contact night-coverage at [email or web address], password [facility] 04/28/2013, 7:15 PM  LOS: 2 days

## 2013-04-29 DIAGNOSIS — I10 Essential (primary) hypertension: Secondary | ICD-10-CM

## 2013-04-29 LAB — GLUCOSE, CAPILLARY
Glucose-Capillary: 89 mg/dL (ref 70–99)
Glucose-Capillary: 244 mg/dL — ABNORMAL HIGH (ref 70–99)
Glucose-Capillary: 204 mg/dL — ABNORMAL HIGH (ref 70–99)

## 2013-04-29 LAB — BASIC METABOLIC PANEL
BUN: 11 mg/dL (ref 6–23)
Calcium: 8.5 mg/dL (ref 8.4–10.5)
Creatinine, Ser: 0.59 mg/dL (ref 0.50–1.10)
GFR calc Af Amer: >90 mL/min (ref >90)
GFR calc non Af Amer: >90 mL/min (ref >90)

## 2013-04-29 MED ORDER — POTASSIUM CHLORIDE CRYS ER 20 MEQ PO TBCR
40.0000 meq | EXTENDED_RELEASE_TABLET | Freq: Once | ORAL | Status: AC
  Administered 2013-04-29: 40 meq via ORAL
  Filled 2013-04-29: qty 2

## 2013-04-29 MED ORDER — INSULIN NPH ISOPHANE & REGULAR (70-30) 100 UNIT/ML ~~LOC~~ SUSP: 35.0000 [IU] | Freq: Two times a day (BID) | SUBCUTANEOUS | Status: DC

## 2013-04-29 MED ORDER — METFORMIN HCL 500 MG PO TABS: 500.0000 mg | ORAL_TABLET | Freq: Two times a day (BID) | ORAL | Status: DC

## 2013-04-29 MED ORDER — LISINOPRIL 20 MG PO TABS: 20.0000 mg | ORAL_TABLET | Freq: Every day | ORAL | Status: DC

## 2013-04-29 MED ORDER — HYDROCHLOROTHIAZIDE 25 MG PO TABS: 25.0000 mg | ORAL_TABLET | Freq: Every day | ORAL | Status: DC

## 2013-04-29 NOTE — Discharge Summary (Signed)
Physician Discharge Summary  Erin Hayes ZOX:[id number] DOB: [birth year] DOA: 04/26/2013  PCP: No PCP Per Patient  Admit date: 04/26/2013 Discharge date: 04/29/2013  Time spent: 60 minutes  Recommendations for Outpatient Follow-up:  1. Followup with PCP at family practice at [place] in 1 week. On followup patient's diabetes will need to be reassessed and patient will need a basic metabolic profile done to followup on electrolytes and renal function.  Discharge Diagnoses:  Principal Problem:   DKA (diabetic ketoacidoses) Active Problems:   Diabetes   Hypertension   Hypokalemia   Leukocytosis, unspecified   Encephalopathy   Discharge Condition: Stable and improved  Diet recommendation: Carb modified  Filed Weights   04/26/13 0145 04/26/13 1254 04/27/13 0400  Weight: 43.7 kg (96 lb 5.5 oz) 43.7 kg (96 lb 5.5 oz) 45.859 kg (101 lb 1.6 oz)    History of present illness:  Erin Hayes is a 47 y/o woman with past medical history of diabetes and hypertension who presented to [facility] with a headache. She had runout of her medications including antihypertensives and insulin. She was treated with morphine, benadryl and toradol for headache when it was noted that her blood sugar was 370 and her anion gap was 20. Her venous pH at that time was 7.12. She was then started on an insulin drip and transferred to [facility] hospital as no ICU beds were available at [facility].    Hospital Course:  #1 diabetic ketoacidosis  Patient was admitted as a transfer from [facility] to the ICU and noted to be in diabetic ketoacidosis. Likely secondary to patient being unable to get her medications secondary to insurance issues. Patient was placed in the ICU hydrate with IV fluids and placed on the glucose stabilized. Patient improved clinically was subsequently started on a diet. Patient anion gap closed and patient was transitioned to subcutaneous insulin. Patient improved clinically was  subsequently placed on 70/30 insulin in a sliding scale. Patient was transferred to the flow is remaining stable and improved condition. Patient was discharged in stable and improved condition and is to followup with PCP as outpatient.  #2 hypokalemia  Patient was noted to be hypokalemic during the hospitalization likely secondary to problem #1. Patient's potassium was repleted and had resolved by day of discharge. #3 uncontrolled diabetes mellitus  Hemoglobin A1c is 18.3. Likely secondary to noncompliance as patient states was unable to get her medications secondary to insurance issues. Diabetic coordinator has been consulted and is following. Once patient was transitioned to 7030 and in diabetic ketoacidosis had resolved he was maintained on a sliding scale as well. Social work was consulted to aid patient in getting her medications. Patient will followup with PCP as outpatient.  #4 leukocytosis  Likely reactive in nature. Resolved.  #5 hypertension  Resume d on lisinopril. HCTZ was held during the hospitalization and will be resumed on discharge.  #5 acute encephalopathy  Likely secondary to problem #1. Resolved.   Procedures:  None  Consultations:  None  Discharge Exam: Filed Vitals:   04/29/13 1345  BP: 123/88  Pulse: 102  Temp: 100.2 F (37.9 C)  Resp: 18    General: NAD Cardiovascular: RRR Respiratory: CTAB  Discharge Instructions  Discharge Orders   Future Orders Complete By Expires   Diet Carb Modified  As directed    Discharge instructions  As directed    Comments:     Follow up Family practice at [place] in 1 week.   Increase activity slowly  As directed  Medication List         hydrochlorothiazide 25 MG tablet  Commonly known as:  HYDRODIURIL  Take 1 tablet (25 mg total) by mouth daily.     insulin NPH-regular (70-30) 100 UNIT/ML injection  Commonly known as:  NOVOLIN 70/30  Inject 35 Units into the skin 2 (two) times daily with a meal.      lisinopril 20 MG tablet  Commonly known as:  PRINIVIL,ZESTRIL  Take 1 tablet (20 mg total) by mouth daily.     metFORMIN 500 MG tablet  Commonly known as:  GLUCOPHAGE  Take 1 tablet (500 mg total) by mouth 2 (two) times daily with a meal.       No Known Allergies     Follow-up Information   Schedule an appointment as soon as possible for a visit in 1 week to follow up. (f/u with family practice at [place] in 1 week)        The results of significant diagnostics from this hospitalization (including imaging, microbiology, ancillary and laboratory) are listed below for reference.    Significant Diagnostic Studies: No results found.  Microbiology: Recent Results (from the past 240 hour(s))  MRSA PCR SCREENING     Status: None   Collection Time    04/26/13  1:51 AM      Result Value Range Status   MRSA by PCR NEGATIVE  NEGATIVE Final   Comment:            The GeneXpert MRSA Assay (FDA     approved for NASAL specimens     only), is one component of a     comprehensive MRSA colonization     surveillance program. It is not     intended to diagnose MRSA     infection nor to guide or     monitor treatment for     MRSA infections.     Labs: Basic Metabolic Panel:  Recent Labs Lab 04/26/13 2200 04/27/13 0400 04/27/13 0830 04/27/13 1940 04/28/13 0816 04/29/13 0418  NA 127* 129* 127* 128* 136 135  K 3.4* 3.4* 3.6 3.2* 3.0* 3.5  CL 98 100 100 96 103 104  CO2 17* 19 16* 18* 21 21  GLUCOSE 162* 107* 162* 238* 98 72  BUN 14 11 10 14 12 11   CREATININE 0.64 0.64 0.56 0.87 0.69 0.59  CALCIUM 8.3* 8.9 8.7 8.4 8.9 8.5  MG  --  1.6  --   --  1.7  --   PHOS  --  1.6*  --   --  3.6  --    Liver Function Tests: No results found for this basename: AST, ALT, ALKPHOS, BILITOT, PROT, ALBUMIN,  in the last 168 hours No results found for this basename: LIPASE, AMYLASE,  in the last 168 hours No results found for this basename: AMMONIA,  in the last 168 hours CBC:  Recent  Labs Lab 04/26/13 0214 04/28/13 0816  WBC 19.3* 6.6  NEUTROABS  --  3.5  HGB 14.5 14.4  HCT 42.0 41.1  MCV 84.5 82.9  PLT 171 202   Cardiac Enzymes:  Recent Labs Lab 04/26/13 1543 04/26/13 2200 04/27/13 0400  TROPONINI <0.30 <0.30 <0.30   BNP: BNP (last 3 results) No results found for this basename: PROBNP,  in the last 8760 hours CBG:  Recent Labs Lab 04/28/13 1115 04/28/13 1631 04/28/13 2158 04/29/13 0739 04/29/13 1125  GLUCAP 317* 125* 149* 89 244*       Signed:  Takirah Binford  M.D. [facility] 04/29/2013, 3:33 PM

## 2013-04-29 NOTE — Progress Notes (Signed)
Pt ready for discharge, to be picked up by friend of dtr, to go home, lives with her dtr.  All DC instructions given and explained, pt is to FU with [facility] in 1 wk.  Pt has # for [facility].  Pt has match letter for med assistance.

## 2013-04-29 NOTE — Progress Notes (Signed)
Pt states that she has no way home today.  She lives with her dtr and her dtr does not have a drivers license.  CSW on call called and informed.

## 2013-11-01 ENCOUNTER — Emergency Department: Payer: Self-pay | Admitting: Emergency Medicine

## 2014-06-26 ENCOUNTER — Emergency Department: Payer: Self-pay | Admitting: Emergency Medicine

## 2014-07-08 ENCOUNTER — Emergency Department: Payer: Self-pay | Admitting: Emergency Medicine

## 2014-09-23 ENCOUNTER — Ambulatory Visit: Payer: Self-pay

## 2014-09-30 ENCOUNTER — Other Ambulatory Visit: Payer: Self-pay

## 2014-10-06 ENCOUNTER — Ambulatory Visit: Payer: Self-pay

## 2014-10-07 ENCOUNTER — Other Ambulatory Visit: Payer: Self-pay

## 2014-10-14 ENCOUNTER — Ambulatory Visit: Payer: Self-pay

## 2014-11-04 ENCOUNTER — Emergency Department
Admission: EM | Admit: 2014-11-04 | Discharge: 2014-11-04 | Disposition: A | Discharge status: Home / Self Care | Payer: Self-pay | Attending: Emergency Medicine | Admitting: Emergency Medicine

## 2014-11-04 ENCOUNTER — Encounter: Payer: Self-pay | Admitting: Emergency Medicine

## 2014-11-04 DIAGNOSIS — E1165 Type 2 diabetes mellitus with hyperglycemia: Secondary | ICD-10-CM | POA: Insufficient documentation

## 2014-11-04 DIAGNOSIS — I10 Essential (primary) hypertension: Secondary | ICD-10-CM | POA: Insufficient documentation

## 2014-11-04 DIAGNOSIS — R739 Hyperglycemia, unspecified: Secondary | ICD-10-CM

## 2014-11-04 LAB — CBC
HEMATOCRIT: 54.9 % — AB (ref 35.0–47.0)
HEMOGLOBIN: 17.4 g/dL — AB (ref 12.0–16.0)
MCH: 27.2 pg (ref 26.0–34.0)
MCHC: 31.6 g/dL — ABNORMAL LOW (ref 32.0–36.0)
MCV: 86 fL (ref 80.0–100.0)
Platelets: 402 10*3/uL (ref 150–440)
RBC: 6.39 MIL/uL — ABNORMAL HIGH (ref 3.80–5.20)
RDW: 12.3 % (ref 11.5–14.5)
WBC: 16.4 10*3/uL — ABNORMAL HIGH (ref 3.6–11.0)

## 2014-11-04 LAB — BASIC METABOLIC PANEL
Anion gap: 8 (ref 5–15)
BUN: 30 mg/dL — ABNORMAL HIGH (ref 6–20)
CHLORIDE: 101 mmol/L (ref 101–111)
CO2: 25 mmol/L (ref 22–32)
Calcium: 7.9 mg/dL — ABNORMAL LOW (ref 8.9–10.3)
Creatinine, Ser: 1.53 mg/dL — ABNORMAL HIGH (ref 0.44–1.00)
GFR, EST AFRICAN AMERICAN: 45 mL/min — AB (ref >60)
GFR, EST NON AFRICAN AMERICAN: 39 mL/min — AB (ref >60)
GLUCOSE: 353 mg/dL — AB (ref 65–99)
POTASSIUM: 4 mmol/L (ref 3.5–5.1)
SODIUM: 134 mmol/L — AB (ref 135–145)

## 2014-11-04 LAB — GLUCOSE, CAPILLARY
GLUCOSE-CAPILLARY: 556 mg/dL — AB (ref 65–99)
Glucose-Capillary: 500 mg/dL — ABNORMAL HIGH (ref 65–99)

## 2014-11-04 LAB — COMPREHENSIVE METABOLIC PANEL
ALBUMIN: 4.6 g/dL (ref 3.5–5.0)
ALK PHOS: 105 U/L (ref 38–126)
ALT: 20 U/L (ref 14–54)
AST: 13 U/L — AB (ref 15–41)
Anion gap: 19 — ABNORMAL HIGH (ref 5–15)
BUN: 33 mg/dL — ABNORMAL HIGH (ref 6–20)
CHLORIDE: 89 mmol/L — AB (ref 101–111)
CO2: 20 mmol/L — ABNORMAL LOW (ref 22–32)
CREATININE: 1.7 mg/dL — AB (ref 0.44–1.00)
Calcium: 9.4 mg/dL (ref 8.9–10.3)
GFR, EST AFRICAN AMERICAN: 39 mL/min — AB (ref >60)
GFR, EST NON AFRICAN AMERICAN: 34 mL/min — AB (ref >60)
Glucose, Bld: 520 mg/dL (ref 65–99)
Potassium: 4.1 mmol/L (ref 3.5–5.1)
Sodium: 128 mmol/L — ABNORMAL LOW (ref 135–145)
Total Bilirubin: 1.4 mg/dL — ABNORMAL HIGH (ref 0.3–1.2)
Total Protein: 8.6 g/dL — ABNORMAL HIGH (ref 6.5–8.1)

## 2014-11-04 LAB — URINALYSIS COMPLETE WITH MICROSCOPIC (ARMC ONLY)
BACTERIA UA: NONE SEEN
Bilirubin Urine: NEGATIVE
Glucose, UA: >500 mg/dL — AB
Hgb urine dipstick: NEGATIVE
NITRITE: NEGATIVE
PROTEIN: NEGATIVE mg/dL
Specific Gravity, Urine: 1.023 (ref 1.005–1.030)
pH: 5 (ref 5.0–8.0)

## 2014-11-04 MED ORDER — SODIUM CHLORIDE 0.9 % IV SOLN
Freq: Once | INTRAVENOUS | Status: AC
  Administered 2014-11-04: 1000 mL via INTRAVENOUS

## 2014-11-04 MED ORDER — INSULIN ASPART 100 UNIT/ML ~~LOC~~ SOLN
5.0000 [IU] | Freq: Once | SUBCUTANEOUS | Status: AC
  Administered 2014-11-04: 5 [IU] via SUBCUTANEOUS

## 2014-11-04 MED ORDER — SODIUM CHLORIDE 0.9 % IV BOLUS (SEPSIS)
1000.0000 mL | Freq: Once | INTRAVENOUS | Status: AC
  Administered 2014-11-04: 1000 mL via INTRAVENOUS

## 2014-11-04 MED ORDER — INSULIN ASPART 100 UNIT/ML ~~LOC~~ SOLN
SUBCUTANEOUS | Status: AC
  Administered 2014-11-04: 5 [IU] via SUBCUTANEOUS
  Filled 2014-11-04: qty 5

## 2014-11-04 MED ORDER — GLIPIZIDE 10 MG PO TABS
ORAL_TABLET | ORAL | Status: AC
  Administered 2014-11-04: 5 mg via ORAL
  Filled 2014-11-04: qty 1

## 2014-11-04 MED ORDER — GLIPIZIDE 5 MG PO TABS: 5.0000 mg | ORAL_TABLET | Freq: Two times a day (BID) | ORAL | Status: DC

## 2014-11-04 MED ORDER — GLIPIZIDE 10 MG PO TABS
5.0000 mg | ORAL_TABLET | Freq: Every day | ORAL | Status: DC
  Administered 2014-11-04: 5 mg via ORAL

## 2014-11-04 NOTE — ED Notes (Signed)
CBG 377

## 2014-11-04 NOTE — ED Notes (Signed)
Pt states that she checked her blood sugar today and it was high, reading in 400's. States CBG has been high X 3 days. Denies sx, denies sickness. Pt alert and oriented X4, active, cooperative, pt in NAD. RR even and unlabored, color WNL.

## 2014-11-04 NOTE — ED Provider Notes (Addendum)
[facility] Emergency Department Provider Note     Time seen: ----------------------------------------- 3:05 PM on 11/04/2014 -----------------------------------------     I have reviewed the triage vital signs and the nursing notes.   HISTORY  Chief Complaint Hyperglycemia    HPI Erin Hayes is a 49 y.o. female who presents ER for high blood sugar. Patient states her blood sugars have been in the 4 to 500s for 3 days. Patient states she is to take glipizide but no longer takes medication. States her sugar normally runs higher than 200, denies any complaints of that she felt really fatigued and weak yesterday. Denies fevers chills other complaints. Just concerned about her high blood sugar today.   Past Medical History  Diagnosis Date  . Diabetes   . Hypertension   . GERD (gastroesophageal reflux disease)   . Non-smoker     Patient Active Problem List   Diagnosis Date Noted  . Hypokalemia 04/28/2013  . Leukocytosis, unspecified 04/28/2013  . Encephalopathy 04/28/2013  . DKA (diabetic ketoacidoses) 04/26/2013  . Diabetes   . Hypertension     History reviewed. No pertinent past surgical history.  Allergies Review of patient's allergies indicates no known allergies.  Social History History  Substance Use Topics  . Smoking status: Never Smoker   . Smokeless tobacco: Not on file  . Alcohol Use: No    Review of Systems Constitutional: Negative for fever. Eyes: Negative for visual changes. ENT: Negative for sore throat. Cardiovascular: Negative for chest pain. Respiratory: Negative for shortness of breath. Gastrointestinal: Negative for abdominal pain, vomiting and diarrhea. Genitourinary: Negative for dysuria. Musculoskeletal: Negative for back pain. Skin: Negative for rash. Neurological: Negative for headaches, positive for weakness  10-point ROS otherwise negative.  ____________________________________________   PHYSICAL  EXAM:  VITAL SIGNS: ED Triage Vitals  Enc Vitals Group     BP 11/04/14 1407 120/86 mmHg     Pulse Rate 11/04/14 1407 115     Resp 11/04/14 1407 16     Temp 11/04/14 1407 98.5 F (36.9 C)     Temp Source 11/04/14 1407 Oral     SpO2 11/04/14 1407 96 %     Weight 11/04/14 1407 100 lb (45.36 kg)     Height 11/04/14 1407 4\' 11"  (1.499 m)     Head Cir --      Peak Flow --      Pain Score --      Pain Loc --      Pain Edu? --      Excl. in [place]? --     Constitutional: Alert and oriented. Well appearing and in no distress. Eyes: Conjunctivae are normal. PERRL. Normal extraocular movements. ENT   Head: Normocephalic and atraumatic.   Nose: No congestion/rhinnorhea.   Mouth/Throat: Mucous membranes are moist.   Neck: No stridor. Hematological/Lymphatic/Immunilogical: No cervical lymphadenopathy. Cardiovascular: Rapid rate, regular rhythm Normal and symmetric distal pulses are present in all extremities. No murmurs, rubs, or gallops. Respiratory: Normal respiratory effort without tachypnea nor retractions. Breath sounds are clear and equal bilaterally. No wheezes/rales/rhonchi. Gastrointestinal: Soft and nontender. No distention. No abdominal bruits. There is no CVA tenderness. Musculoskeletal: Nontender with normal range of motion in all extremities. No joint effusions.  No lower extremity tenderness nor edema. Neurologic:  Normal speech and language. No gross focal neurologic deficits are appreciated. Speech is normal. No gait instability. Skin:  Skin is warm, dry and intact. No rash noted. Psychiatric: Mood and affect are normal. Speech and behavior  are normal. Patient exhibits appropriate insight and judgment. ____________________________________________  ED COURSE:  Pertinent labs & imaging results that were available during my care of the patient were reviewed by me and considered in my medical decision making (see chart for details). Patient is no acute distress, given  saline bolus as well as subcutaneous insulin and by mouth glipizide. ____________________________________________    LABS (pertinent positives/negatives)  Labs Reviewed  GLUCOSE, CAPILLARY - Abnormal; Notable for the following:    Glucose-Capillary 556 (*)    All other components within normal limits  GLUCOSE, CAPILLARY - Abnormal; Notable for the following:    Glucose-Capillary 500 (*)    All other components within normal limits  COMPREHENSIVE METABOLIC PANEL - Abnormal; Notable for the following:    Sodium 128 (*)    Chloride 89 (*)    CO2 20 (*)    Glucose, Bld 520 (*)    BUN 33 (*)    Creatinine, Ser 1.70 (*)    Total Protein 8.6 (*)    AST 13 (*)    Total Bilirubin 1.4 (*)    GFR calc non Af Amer 34 (*)    GFR calc Af Amer 39 (*)    Anion gap 19 (*)    All other components within normal limits  CBC - Abnormal; Notable for the following:    WBC 16.4 (*)    RBC 6.39 (*)    Hemoglobin 17.4 (*)    HCT 54.9 (*)    MCHC 31.6 (*)    All other components within normal limits  URINALYSIS COMPLETEWITH MICROSCOPIC ([facility] ONLY) - Abnormal; Notable for the following:    Color, Urine YELLOW (*)    APPearance CLEAR (*)    Glucose, UA >500 (*)    Ketones, ur 1+ (*)    Leukocytes, UA 1+ (*)    Squamous Epithelial / LPF 0-5 (*)    All other components within normal limits  BASIC METABOLIC PANEL - Abnormal; Notable for the following:    Sodium 134 (*)    Glucose, Bld 353 (*)    BUN 30 (*)    Creatinine, Ser 1.53 (*)    Calcium 7.9 (*)    GFR calc non Af Amer 39 (*)    GFR calc Af Amer 45 (*)    All other components within normal limits  CBG MONITORING, ED    RADIOLOGY Images were viewed by me  None  ____________________________________________  FINAL ASSESSMENT AND PLAN  Hyperglycemia  Plan: Patient with resolving hyperglycemia after saline, insulin and glipizide. She'll continue home on her metformin, will add glipizide to her regimen. She is encouraged follow-up  with her doctor for reevaluation or return here for worsening or worrisome symptoms. Patient agrees with plan.   Earleen Newport, MD   Earleen Newport, MD 11/04/14 [place], MD 11/04/14 [phone]

## 2014-11-04 NOTE — ED Notes (Signed)
Pt alert and oriented X4, active, cooperative, pt in NAD. RR even and unlabored, color WNL.  Pt informed to return if any life threatening symptoms occur.   

## 2014-11-04 NOTE — ED Notes (Signed)
Patient states that her blood glucose has been 400-500's for 3 days. Patient has not changed her diabetic regimen. States her sugar normally runs in the 200-300's. No obvious distress. Denies any n/v. Reports vaginal itching and dysuria. Denies any vaginal discharge.

## 2014-11-04 NOTE — Discharge Instructions (Signed)

## 2014-11-05 LAB — GLUCOSE, CAPILLARY: Glucose-Capillary: 377 mg/dL — ABNORMAL HIGH (ref 65–99)

## 2015-02-03 ENCOUNTER — Ambulatory Visit: Payer: Self-pay | Admitting: Ophthalmology

## 2015-02-10 ENCOUNTER — Ambulatory Visit: Payer: Self-pay | Admitting: Ophthalmology

## 2015-02-17 ENCOUNTER — Ambulatory Visit: Payer: Self-pay | Admitting: Ophthalmology

## 2015-02-17 LAB — HM DIABETES EYE EXAM

## 2015-02-24 ENCOUNTER — Ambulatory Visit: Payer: Self-pay | Admitting: Ophthalmology

## 2015-03-10 ENCOUNTER — Other Ambulatory Visit: Payer: Self-pay

## 2015-03-17 ENCOUNTER — Ambulatory Visit: Payer: Self-pay

## 2015-05-05 ENCOUNTER — Emergency Department
Admission: EM | Admit: 2015-05-05 | Discharge: 2015-05-05 | Disposition: A | Discharge status: Home / Self Care | Payer: Self-pay | Attending: Student | Admitting: Student

## 2015-05-05 DIAGNOSIS — R739 Hyperglycemia, unspecified: Secondary | ICD-10-CM

## 2015-05-05 DIAGNOSIS — Z79899 Other long term (current) drug therapy: Secondary | ICD-10-CM | POA: Insufficient documentation

## 2015-05-05 DIAGNOSIS — Z7984 Long term (current) use of oral hypoglycemic drugs: Secondary | ICD-10-CM | POA: Insufficient documentation

## 2015-05-05 DIAGNOSIS — R3129 Other microscopic hematuria: Secondary | ICD-10-CM | POA: Insufficient documentation

## 2015-05-05 DIAGNOSIS — I10 Essential (primary) hypertension: Secondary | ICD-10-CM | POA: Insufficient documentation

## 2015-05-05 DIAGNOSIS — Z794 Long term (current) use of insulin: Secondary | ICD-10-CM | POA: Insufficient documentation

## 2015-05-05 DIAGNOSIS — E1165 Type 2 diabetes mellitus with hyperglycemia: Secondary | ICD-10-CM | POA: Insufficient documentation

## 2015-05-05 LAB — CBC
HEMATOCRIT: 44.9 % (ref 35.0–47.0)
Hemoglobin: 14.7 g/dL (ref 12.0–16.0)
MCH: 27.5 pg (ref 26.0–34.0)
MCHC: 32.7 g/dL (ref 32.0–36.0)
MCV: 84 fL (ref 80.0–100.0)
PLATELETS: 336 10*3/uL (ref 150–440)
RBC: 5.35 MIL/uL — ABNORMAL HIGH (ref 3.80–5.20)
RDW: 11.9 % (ref 11.5–14.5)
WBC: 12.3 10*3/uL — ABNORMAL HIGH (ref 3.6–11.0)

## 2015-05-05 LAB — BASIC METABOLIC PANEL
Anion gap: 14 (ref 5–15)
BUN: 13 mg/dL (ref 6–20)
CHLORIDE: 89 mmol/L — AB (ref 101–111)
CO2: 26 mmol/L (ref 22–32)
Calcium: 9.5 mg/dL (ref 8.9–10.3)
Creatinine, Ser: 0.98 mg/dL (ref 0.44–1.00)
GFR calc Af Amer: >60 mL/min (ref >60)
GFR calc non Af Amer: >60 mL/min (ref >60)
GLUCOSE: 482 mg/dL — AB (ref 65–99)
POTASSIUM: 4.5 mmol/L (ref 3.5–5.1)
Sodium: 129 mmol/L — ABNORMAL LOW (ref 135–145)

## 2015-05-05 LAB — URINALYSIS COMPLETE WITH MICROSCOPIC (ARMC ONLY)
Bacteria, UA: NONE SEEN
Bilirubin Urine: NEGATIVE
Glucose, UA: >500 mg/dL — AB
Hgb urine dipstick: NEGATIVE
Nitrite: NEGATIVE
Protein, ur: NEGATIVE mg/dL
Specific Gravity, Urine: 1.031 — ABNORMAL HIGH (ref 1.005–1.030)
pH: 5 (ref 5.0–8.0)

## 2015-05-05 LAB — GLUCOSE, CAPILLARY
GLUCOSE-CAPILLARY: 426 mg/dL — AB (ref 65–99)
GLUCOSE-CAPILLARY: 343 mg/dL — AB (ref 65–99)
Glucose-Capillary: 428 mg/dL — ABNORMAL HIGH (ref 65–99)
Glucose-Capillary: 453 mg/dL — ABNORMAL HIGH (ref 65–99)
Glucose-Capillary: 297 mg/dL — ABNORMAL HIGH (ref 65–99)

## 2015-05-05 MED ORDER — SODIUM CHLORIDE 0.9 % IV BOLUS (SEPSIS): 1000.0000 mL | Freq: Once | INTRAVENOUS | Status: DC

## 2015-05-05 MED ORDER — GLIPIZIDE 5 MG PO TABS
5.0000 mg | ORAL_TABLET | Freq: Two times a day (BID) | ORAL | Status: DC
Start: 2015-05-05 — End: 2015-09-13

## 2015-05-05 MED ORDER — GLIPIZIDE 10 MG PO TABS
5.0000 mg | ORAL_TABLET | Freq: Once | ORAL | Status: AC
  Administered 2015-05-05: 5 mg via ORAL
  Filled 2015-05-05: qty 1

## 2015-05-05 MED ORDER — SODIUM CHLORIDE 0.9 % IV BOLUS (SEPSIS)
1000.0000 mL | Freq: Once | INTRAVENOUS | Status: AC
  Administered 2015-05-05: 1000 mL via INTRAVENOUS

## 2015-05-05 MED ORDER — INSULIN ASPART 100 UNIT/ML ~~LOC~~ SOLN
6.0000 [IU] | Freq: Once | SUBCUTANEOUS | Status: AC
  Administered 2015-05-05: 6 [IU] via SUBCUTANEOUS
  Filled 2015-05-05: qty 6

## 2015-05-05 NOTE — Discharge Instructions (Signed)
Schedule an appointment to see your doctor as soon as possible so that he can recheck your blood pressure as well as your urine since you had a very small amount of blood in your urine today. Return to the emergency department if you develop chest pain, difficulty breathing, confusion, numbness or weakness, vision change, severe vomiting, diarrhea, blood in vomit or stools, fever or for any other concerns.

## 2015-05-05 NOTE — ED Notes (Signed)
Pt also did not take her bp meds today - taking now

## 2015-05-05 NOTE — ED Notes (Signed)
Pt did not take her metformin, and glipizide. Out of glipizide x 2 days. Taking her metformin now

## 2015-05-05 NOTE — ED Provider Notes (Signed)
[facility] Emergency Department Provider Note  ____________________________________________  Time seen: Approximately 12:21 PM  I have reviewed the triage vital signs and the nursing notes.   HISTORY  Chief Complaint Hyperglycemia    HPI RANIM HERTLING is a 49 y.o. female with history of diabetes, hypertension, GERD who presents for evaluation of asymptomatic hyperglycemia since yesterday in the setting of running out of her glipizide, gradual onset, constant since onset, usually helps with taking her home medications. The patient reports that she is taking her metformin however she has been out of her glipizide for the past 2 days. She is requesting a refill. Yesterday she checked her blood glucose and it was "high". Today her blood sugar was approximately 500. She denies any chest pain or difficulty breathing. No abdominal pain, vomiting, diarrhea, fevers or chills, no cough. No headache, vision changes, numbness or weakness. She is post menopausal. No pain or burning with urination.   Past Medical History  Diagnosis Date  . Diabetes ([place])   . Hypertension   . GERD (gastroesophageal reflux disease)   . Non-smoker     Patient Active Problem List   Diagnosis Date Noted  . Hypokalemia 04/28/2013  . Leukocytosis, unspecified 04/28/2013  . Encephalopathy 04/28/2013  . DKA (diabetic ketoacidoses) ([place]) 04/26/2013  . Diabetes ([place])   . Hypertension     History reviewed. No pertinent past surgical history.  Current Outpatient Rx  Name  Route  Sig  Dispense  Refill  . glipiZIDE (GLUCOTROL) 5 MG tablet   Oral   Take 1 tablet (5 mg total) by mouth 2 (two) times daily.   60 tablet   11   . glipiZIDE (GLUCOTROL) 5 MG tablet   Oral   Take 1 tablet (5 mg total) by mouth 2 (two) times daily.   60 tablet   0   . hydrochlorothiazide (HYDRODIURIL) 25 MG tablet   Oral   Take 1 tablet (25 mg total) by mouth daily.   30 tablet   0   . insulin NPH-regular  (NOVOLIN 70/30) (70-30) 100 UNIT/ML injection   Subcutaneous   Inject 35 Units into the skin 2 (two) times daily with a meal. Patient not taking: Reported on 11/04/2014   10 mL   0   . lisinopril (PRINIVIL,ZESTRIL) 20 MG tablet   Oral   Take 1 tablet (20 mg total) by mouth daily.   30 tablet   0   . metFORMIN (GLUCOPHAGE) 500 MG tablet   Oral   Take 1 tablet (500 mg total) by mouth 2 (two) times daily with a meal.   62 tablet   0     Allergies Review of patient's allergies indicates no known allergies.  No family history on file.  Social History Social History  Substance Use Topics  . Smoking status: Never Smoker   . Smokeless tobacco: None  . Alcohol Use: No    Review of Systems Constitutional: No fever/chills Eyes: No visual changes. ENT: No sore throat. Cardiovascular: Denies chest pain. Respiratory: Denies shortness of breath. Gastrointestinal: No abdominal pain.  No nausea, no vomiting.  No diarrhea.  No constipation. Genitourinary: Negative for dysuria. Musculoskeletal: Negative for back pain. Skin: Negative for rash. Neurological: Negative for headaches, focal weakness or numbness.  10-point ROS otherwise negative.  ____________________________________________   PHYSICAL EXAM:  VITAL SIGNS: ED Triage Vitals  Enc Vitals Group     BP 05/05/15 1032 143/124 mmHg     Pulse Rate 05/05/15 1032 112  Resp 05/05/15 1032 18     Temp 05/05/15 1032 99.2 F (37.3 C)     Temp Source 05/05/15 1032 Oral     SpO2 05/05/15 1032 100 %     Weight 05/05/15 1032 115 lb (52.164 kg)     Height 05/05/15 1032 4\' 11"  (1.499 m)     Head Cir --      Peak Flow --      Pain Score --      Pain Loc --      Pain Edu? --      Excl. in [place]? --     Constitutional: Alert and oriented. Well appearing and in no acute distress. Eyes: Conjunctivae are normal. PERRL. EOMI. Head: Atraumatic. Nose: No congestion/rhinnorhea. Mouth/Throat: Mucous membranes are moist.  Oropharynx  non-erythematous. Neck: No stridor.  Cardiovascular: Normal rate, regular rhythm. Grossly normal heart sounds.  Good peripheral circulation. Respiratory: Normal respiratory effort.  No retractions. Lungs CTAB. Gastrointestinal: Soft and nontender. No distention. No CVA tenderness. Genitourinary: deferred Musculoskeletal: No lower extremity tenderness nor edema.  No joint effusions. Neurologic:  Normal speech and language. No gross focal neurologic deficits are appreciated. No gait instability. Skin:  Skin is warm, dry and intact. No rash noted. Psychiatric: Mood and affect are normal. Speech and behavior are normal.  ____________________________________________   LABS (all labs ordered are listed, but only abnormal results are displayed)  Labs Reviewed  GLUCOSE, CAPILLARY - Abnormal; Notable for the following:    Glucose-Capillary 453 (*)    All other components within normal limits  GLUCOSE, CAPILLARY - Abnormal; Notable for the following:    Glucose-Capillary 428 (*)    All other components within normal limits  BASIC METABOLIC PANEL - Abnormal; Notable for the following:    Sodium 129 (*)    Chloride 89 (*)    Glucose, Bld 482 (*)    All other components within normal limits  CBC - Abnormal; Notable for the following:    WBC 12.3 (*)    RBC 5.35 (*)    All other components within normal limits  URINALYSIS COMPLETEWITH MICROSCOPIC ([facility] ONLY) - Abnormal; Notable for the following:    Color, Urine YELLOW (*)    APPearance CLEAR (*)    Glucose, UA >500 (*)    Ketones, ur 2+ (*)    Specific Gravity, Urine 1.031 (*)    Leukocytes, UA 1+ (*)    Squamous Epithelial / LPF 0-5 (*)    All other components within normal limits  GLUCOSE, CAPILLARY - Abnormal; Notable for the following:    Glucose-Capillary 426 (*)    All other components within normal limits  GLUCOSE, CAPILLARY - Abnormal; Notable for the following:    Glucose-Capillary 343 (*)    All other components within  normal limits  GLUCOSE, CAPILLARY - Abnormal; Notable for the following:    Glucose-Capillary 297 (*)    All other components within normal limits  CBG MONITORING, ED   ____________________________________________  EKG  none ____________________________________________  RADIOLOGY  none ____________________________________________   PROCEDURES  Procedure(s) performed: None  Critical Care performed: No  ____________________________________________   INITIAL IMPRESSION / ASSESSMENT AND PLAN / ED COURSE  Pertinent labs & imaging results that were available during my care of the patient were reviewed by me and considered in my medical decision making (see chart for details).  ADDYLYNN MUKES is a 49 y.o. female with history of diabetes, hypertension, GERD who presents for evaluation of asymptomatic hyperglycemia since yesterday. On  exam, she is very well-appearing and in no acute distress. On arrival to the emergency department, mildly tachycardic however this is resolved at the time of my assessment. She does have diastolic asymptomatic hypertension with blood pressure 150/117, she has not taken her blood pressure meds today. We will give them to her. Labs are reviewed. CBC is notable for chronic leukocytosis with white blood cell count 12.3. Glucose is 453. Ketones noted in urine as well as several red blood cells however normal bicarbonate at 26 and anion gap not wide, not consistent with diabetic ketoacidosis. Sodium corrects appropriately for her degree of hyperglycemia. We'll give IV fluids, insulin and refill her prescription for glipizide. Anticipate discharge once her glucose improves.  ----------------------------------------- 2:45 PM on 05/05/2015 -----------------------------------------  Blood sugar improved to 296. Blood pressure improved to 141/111, discussed that she needs to follow up with her primary care doctor within 1 week for recheck of blood pressure as well as  repeat urinalysis given microhematuria. DC home with return precautions and close PCP follow-up she is comfortable with the discharge plan. ____________________________________________   FINAL CLINICAL IMPRESSION(S) / ED DIAGNOSES  Final diagnoses:  Hyperglycemia  Essential hypertension  Microhematuria      Joanne Gavel, MD 05/05/15 [phone]

## 2015-05-05 NOTE — ED Notes (Signed)
Pt c/o glucose running high in the 400's for the past 2 days,, denies any recent illness or change in medications, states she takes oral meds for DM.Erin Hayes

## 2015-07-27 ENCOUNTER — Encounter: Payer: Self-pay | Admitting: Nurse Practitioner

## 2015-07-28 ENCOUNTER — Ambulatory Visit: Payer: Self-pay | Admitting: Ophthalmology

## 2015-09-07 ENCOUNTER — Ambulatory Visit: Payer: Self-pay | Admitting: Internal Medicine

## 2015-09-08 ENCOUNTER — Ambulatory Visit: Payer: Self-pay | Admitting: Ophthalmology

## 2015-09-13 ENCOUNTER — Ambulatory Visit: Payer: Self-pay | Admitting: Family Medicine

## 2015-09-13 VITALS — BP 205/135 | HR 105 | Wt 107.0 lb

## 2015-09-13 DIAGNOSIS — I10 Essential (primary) hypertension: Secondary | ICD-10-CM

## 2015-09-13 DIAGNOSIS — E119 Type 2 diabetes mellitus without complications: Secondary | ICD-10-CM

## 2015-09-13 MED ORDER — HYDROCHLOROTHIAZIDE 25 MG PO TABS: 25.0000 mg | ORAL_TABLET | Freq: Every day | ORAL | Status: DC

## 2015-09-13 MED ORDER — GLIPIZIDE 5 MG PO TABS: 5.0000 mg | ORAL_TABLET | Freq: Two times a day (BID) | ORAL | Status: DC

## 2015-09-13 MED ORDER — METFORMIN HCL 500 MG PO TABS: 500.0000 mg | ORAL_TABLET | Freq: Two times a day (BID) | ORAL | Status: DC

## 2015-09-13 MED ORDER — LISINOPRIL 20 MG PO TABS: 20.0000 mg | ORAL_TABLET | Freq: Every day | ORAL | Status: DC

## 2015-09-13 NOTE — Progress Notes (Signed)
Name: Erin Hayes   MRN: [id number]    DOB: 12-28-1965   Date:09/13/2015       Progress Note  Subjective  Chief Complaint  Chief Complaint  Patient presents with  . Medication Refill    HPI  Follow up diabetes. Not checking sugars. Ran out of glipizide for a few months. Had previously taken NPH, but off for the last year. Feels well not unusually tired or sleepy.   Was previously on lisinopril, but has been out of that for several months.    No problem-specific assessment & plan notes found for this encounter.   Past Medical History  Diagnosis Date  . Diabetes ([place])   . Hypertension   . GERD (gastroesophageal reflux disease)   . Non-smoker     Social History  Substance Use Topics  . Smoking status: Never Smoker   . Smokeless tobacco: Not on file  . Alcohol Use: No     Current outpatient prescriptions:  .  aspirin 81 MG tablet, Take 81 mg by mouth daily., Disp: , Rfl:  .  glipiZIDE (GLUCOTROL) 5 MG tablet, Take 1 tablet (5 mg total) by mouth 2 (two) times daily., Disp: 60 tablet, Rfl: 11 .  hydrochlorothiazide (HYDRODIURIL) 25 MG tablet, Take 1 tablet (25 mg total) by mouth daily., Disp: 30 tablet, Rfl: 0 .  metFORMIN (GLUCOPHAGE) 500 MG tablet, Take 1 tablet (500 mg total) by mouth 2 (two) times daily with a meal., Disp: 62 tablet, Rfl: 0 .  glipiZIDE (GLUCOTROL) 5 MG tablet, Take 1 tablet (5 mg total) by mouth 2 (two) times daily., Disp: 60 tablet, Rfl: 0 .  insulin NPH-regular (NOVOLIN 70/30) (70-30) 100 UNIT/ML injection, Inject 35 Units into the skin 2 (two) times daily with a meal. (Patient not taking: Reported on 11/04/2014), Disp: 10 mL, Rfl: 0 .  lisinopril (PRINIVIL,ZESTRIL) 20 MG tablet, Take 1 tablet (20 mg total) by mouth daily., Disp: 30 tablet, Rfl: 0  No Known Allergies  Review of Systems  Constitutional: Negative for weight loss and malaise/fatigue.  Eyes: Negative for blurred vision.  Respiratory: Negative.  Negative for cough, shortness of breath  and wheezing.   Cardiovascular: Negative for chest pain, palpitations, orthopnea, claudication, leg swelling and PND.  Neurological: Negative for weakness and headaches.       Objective  Filed Vitals:   09/13/15 1924  BP: 205/135  Pulse: 105  Weight: 107 lb (48.535 kg)  Glucose: High   Physical Exam  General appearance: alert, well developed, well nourished, cooperative and in no distress Head: Normocephalic, without obvious abnormality, atraumatic Lungs: Respirations even and unlabored Extremities: No gross deformities Skin: Skin color, texture, turgor normal. No rashes seen  Psych: Appropriate mood and affect. Neurologic: Mental status: Alert, oriented to person, place, and time, thought content appropriate. CV: RRR without murmurs.   No results found for this or any previous visit (from the past 2160 hour(s)).   Assessment & Plan  There are no diagnoses linked to this encounter.  1. Type 2 diabetes mellitus without complication, without long-term current use of insulin ([place]) Uncontrolled due to running out of glipizide. Also off NPH insulin.start back on glipized and reutrn to check sugar in one month - glipiZIDE (GLUCOTROL) 5 MG tablet; Take 1 tablet (5 mg total) by mouth 2 (two) times daily.  Dispense: 180 tablet; Refill: 1 - metFORMIN (GLUCOPHAGE) 500 MG tablet; Take 1 tablet (500 mg total) by mouth 2 (two) times daily with a meal.  Dispense: 180 tablet;  Refill: 1  2. Essential hypertension Out of lisinopril. Restart and return 1 month BP check - lisinopril (PRINIVIL,ZESTRIL) 20 MG tablet; Take 1 tablet (20 mg total) by mouth daily.  Dispense: 90 tablet; Refill: 0 - hydrochlorothiazide (HYDRODIURIL) 25 MG tablet; Take 1 tablet (25 mg total) by mouth daily.  Dispense: 90 tablet; Refill: 1   Currentoy having no sx of uncontrolled DM or malignant hypertension.

## 2015-09-14 ENCOUNTER — Other Ambulatory Visit: Payer: Self-pay | Admitting: Urology

## 2015-09-14 DIAGNOSIS — E119 Type 2 diabetes mellitus without complications: Secondary | ICD-10-CM

## 2015-09-14 DIAGNOSIS — I10 Essential (primary) hypertension: Secondary | ICD-10-CM

## 2015-09-14 NOTE — Telephone Encounter (Signed)
Erin Hayes from [facility] called to let us know that Erin Hayes daughter, Erin Hayes, was there to pick up prescriptions from 09/13/15 visit with Dr. Caryn Section at [facility], however per [name] never came through via escribe.  Requesting they be resent.  Informed her that we would have to have a provider reorder them during Thursday evening's clinic and to let the patient know to check back with them on Friday.   RXs that need to be reordered include: metformin, glipizide, hctz, and lisinopril.

## 2015-09-15 ENCOUNTER — Other Ambulatory Visit: Payer: Self-pay | Admitting: Urology

## 2015-09-15 DIAGNOSIS — I10 Essential (primary) hypertension: Secondary | ICD-10-CM

## 2015-09-15 DIAGNOSIS — E119 Type 2 diabetes mellitus without complications: Secondary | ICD-10-CM

## 2015-09-15 MED ORDER — LISINOPRIL 20 MG PO TABS: 20.0000 mg | ORAL_TABLET | Freq: Every day | ORAL | Status: DC

## 2015-09-15 MED ORDER — HYDROCHLOROTHIAZIDE 25 MG PO TABS: 25.0000 mg | ORAL_TABLET | Freq: Every day | ORAL | Status: DC

## 2015-09-15 MED ORDER — METFORMIN HCL 500 MG PO TABS: 500.0000 mg | ORAL_TABLET | Freq: Two times a day (BID) | ORAL | Status: DC

## 2015-09-15 MED ORDER — GLIPIZIDE 5 MG PO TABS: 5.0000 mg | ORAL_TABLET | Freq: Two times a day (BID) | ORAL | Status: DC

## 2015-09-15 NOTE — Telephone Encounter (Signed)
Done

## 2015-09-21 ENCOUNTER — Emergency Department: Payer: Self-pay

## 2015-09-21 ENCOUNTER — Emergency Department
Admission: EM | Admit: 2015-09-21 | Discharge: 2015-09-21 | Disposition: A | Discharge status: Home / Self Care | Payer: Self-pay | Attending: Emergency Medicine | Admitting: Emergency Medicine

## 2015-09-21 DIAGNOSIS — Y939 Activity, unspecified: Secondary | ICD-10-CM | POA: Insufficient documentation

## 2015-09-21 DIAGNOSIS — E119 Type 2 diabetes mellitus without complications: Secondary | ICD-10-CM | POA: Insufficient documentation

## 2015-09-21 DIAGNOSIS — Z7982 Long term (current) use of aspirin: Secondary | ICD-10-CM | POA: Insufficient documentation

## 2015-09-21 DIAGNOSIS — S8292XA Unspecified fracture of left lower leg, initial encounter for closed fracture: Secondary | ICD-10-CM | POA: Insufficient documentation

## 2015-09-21 DIAGNOSIS — Z79899 Other long term (current) drug therapy: Secondary | ICD-10-CM | POA: Insufficient documentation

## 2015-09-21 DIAGNOSIS — S82892A Other fracture of left lower leg, initial encounter for closed fracture: Secondary | ICD-10-CM

## 2015-09-21 DIAGNOSIS — I10 Essential (primary) hypertension: Secondary | ICD-10-CM | POA: Insufficient documentation

## 2015-09-21 DIAGNOSIS — X58XXXA Exposure to other specified factors, initial encounter: Secondary | ICD-10-CM | POA: Insufficient documentation

## 2015-09-21 DIAGNOSIS — Z7984 Long term (current) use of oral hypoglycemic drugs: Secondary | ICD-10-CM | POA: Insufficient documentation

## 2015-09-21 DIAGNOSIS — Y999 Unspecified external cause status: Secondary | ICD-10-CM | POA: Insufficient documentation

## 2015-09-21 DIAGNOSIS — Y929 Unspecified place or not applicable: Secondary | ICD-10-CM | POA: Insufficient documentation

## 2015-09-21 NOTE — ED Provider Notes (Signed)
[facility] Emergency Department Provider Note ____________________________________________  Time seen: 2211  I have reviewed the triage vital signs and the nursing notes.  HISTORY  Chief Complaint  Leg Swelling  HPI Erin Hayes is a 50 y.o. female to the ED with swelling and intermittent pain to the left ankle. Pain is went to the ankle. Within 2 days she noted increased lateral and medial ankle swelling. She has been ablated however in the interim without significant difficulty. She has some referred pain into the left knee but denies any interim injury.She rates the pain in her left ankle at a 7/10 in triage.  Past Medical History  Diagnosis Date  . Diabetes ([place])   . Hypertension   . GERD (gastroesophageal reflux disease)   . Non-smoker     Patient Active Problem List   Diagnosis Date Noted  . Hypokalemia 04/28/2013  . Leukocytosis, unspecified 04/28/2013  . Encephalopathy 04/28/2013  . DKA (diabetic ketoacidoses) ([place]) 04/26/2013  . Diabetes ([place])   . Hypertension     No past surgical history on file.  Current Outpatient Rx  Name  Route  Sig  Dispense  Refill  . aspirin 81 MG tablet   Oral   Take 81 mg by mouth daily.         [name] glipiZIDE (GLUCOTROL) 5 MG tablet   Oral   Take 1 tablet (5 mg total) by mouth 2 (two) times daily.   180 tablet   1   . hydrochlorothiazide (HYDRODIURIL) 25 MG tablet   Oral   Take 1 tablet (25 mg total) by mouth daily.   90 tablet   1   . lisinopril (PRINIVIL,ZESTRIL) 20 MG tablet   Oral   Take 1 tablet (20 mg total) by mouth daily.   90 tablet   0   . metFORMIN (GLUCOPHAGE) 500 MG tablet   Oral   Take 1 tablet (500 mg total) by mouth 2 (two) times daily with a meal.   180 tablet   1    Allergies Review of patient's allergies indicates no known allergies.  Family History  Problem Relation Age of Onset  . Hypertension Father     Social History Social History  Substance Use Topics  .  Smoking status: Never Smoker   . Smokeless tobacco: Not on file  . Alcohol Use: No   Review of Systems  Constitutional: Negative for fever. Musculoskeletal: Negative for back pain. Left ankle pain as above Skin: Negative for rash. Neurological: Negative for headaches, focal weakness or numbness. ____________________________________________  PHYSICAL EXAM:  VITAL SIGNS: ED Triage Vitals  Enc Vitals Group     BP 09/21/15 2042 152/99 mmHg     Pulse Rate 09/21/15 2042 108     Resp 09/21/15 2042 20     Temp 09/21/15 2042 99.1 F (37.3 C)     Temp Source 09/21/15 2042 Oral     SpO2 09/21/15 2042 97 %     Weight 09/21/15 2042 109 lb (49.442 kg)     Height 09/21/15 2042 4\' 11"  (1.499 m)     Head Cir --      Peak Flow --      Pain Score 09/21/15 2042 7     Pain Loc --      Pain Edu? --      Excl. in [place]? --    Constitutional: Alert and oriented. Well appearing and in no distress. Head: Normocephalic and atraumatic. Cardiovascular: Normal rate, regular rhythm. Normal  distal pulses and cap refill Respiratory: Normal respiratory effort.  Musculoskeletal: Nontender with normal range of motion in all extremities. Left ankle with moderate swelling medially and laterally. Normal ankle ROM, negative drawer. Moderately tender to palp at the lateral malleolus. No calf or achilles tenderness noted. Normal knee exam without evidence of internal derangement.  Neurologic:  Normal gait without ataxia. Normal speech and language. No gross focal neurologic deficits are appreciated. Skin:  Skin is warm, dry and intact. No rash noted. ____________________________________________   RADIOLOGY  Left Ankle IMPRESSION: Soft tissue swelling.  Cannot exclude subtle avulsion fracture at tip of lateral malleolus; recommend correlation for pain and tenderness at this site.  I, Johnell Bas, Dannielle Karvonen, personally viewed and evaluated these images (plain radiographs) as part of my medical decision making,  as well as reviewing the written report by the radiologist. ____________________________________________  PROCEDURES  Ankle Velcro stirrup Crutches ____________________________________________  INITIAL IMPRESSION / ASSESSMENT AND PLAN / ED COURSE  Patient with a probable nondisplaced lateral malleolar fracture of the left ankle. Initial fracture care provided with splinting and crutches. She will dose Aleve as needed for pain. She will follow-up with Dr. Sabra Heck or her provider for ongoing symptom management.  ____________________________________________  FINAL CLINICAL IMPRESSION(S) / ED DIAGNOSES  Final diagnoses:  Ankle fracture, left, closed, initial encounter     Melvenia Needles, PA-C 09/21/15 [place], MD 09/21/15 2312

## 2015-09-21 NOTE — Discharge Instructions (Signed)
Cast or Splint Care Casts and splints support injured limbs and keep bones from moving while they heal.  HOME CARE  Keep the cast or splint uncovered during the drying period.  A plaster cast can take 24 to 48 hours to dry.  A fiberglass cast will dry in less than 1 hour.  Do not rest the cast on anything harder than a pillow for 24 hours.  Do not put weight on your injured limb. Do not put pressure on the cast. Wait for your doctor's approval.  Keep the cast or splint dry.  Cover the cast or splint with a plastic bag during baths or wet weather.  If you have a cast over your chest and belly (trunk), take sponge baths until the cast is taken off.  If your cast gets wet, dry it with a towel or blow dryer. Use the cool setting on the blow dryer.  Keep your cast or splint clean. Wash a dirty cast with a damp cloth.  Do not put any objects under your cast or splint.  Do not scratch the skin under the cast with an object. If itching is a problem, use a blow dryer on a cool setting over the itchy area.  Do not trim or cut your cast.  Do not take out the padding from inside your cast.  Exercise your joints near the cast as told by your doctor.  Raise (elevate) your injured limb on 1 or 2 pillows for the first 1 to 3 days. GET HELP IF:  Your cast or splint cracks.  Your cast or splint is too tight or too loose.  You itch badly under the cast.  Your cast gets wet or has a soft spot.  You have a bad smell coming from the cast.  You get an object stuck under the cast.  Your skin around the cast becomes red or sore.  You have new or more pain after the cast is put on. GET HELP RIGHT AWAY IF:  You have fluid leaking through the cast.  You cannot move your fingers or toes.  Your fingers or toes turn blue or white or are cool, painful, or puffy (swollen).  You have tingling or lose feeling (numbness) around the injured area.  You have bad pain or pressure under the  cast.  You have trouble breathing or have shortness of breath.  You have chest pain.   This information is not intended to replace advice given to you by your health care provider. Make sure you discuss any questions you have with your health care provider.   Document Released: 08/23/2010 Document Revised: 12/24/2012 Document Reviewed: 10/30/2012 Elsevier Interactive Patient Education 2016 [employer].   Wear the splint for support. Rest with the foot elevated. Apply ice to reduce symptoms. Follow-up with Dr. Sabra Heck or your provider for ongoing symptoms. Take Aleve daily for pain and inflammation.

## 2015-09-21 NOTE — ED Notes (Signed)
Pt in with co left knee pain for 1 week states no injury, did fall last week but states pain started before injury.  Pt also co left ankle swelling no hx of the same.

## 2015-10-04 ENCOUNTER — Emergency Department
Admission: EM | Admit: 2015-10-04 | Discharge: 2015-10-05 | Disposition: A | Discharge status: Home / Self Care | Payer: Self-pay | Attending: Emergency Medicine | Admitting: Emergency Medicine

## 2015-10-04 ENCOUNTER — Encounter: Payer: Self-pay | Admitting: Emergency Medicine

## 2015-10-04 DIAGNOSIS — Z7984 Long term (current) use of oral hypoglycemic drugs: Secondary | ICD-10-CM | POA: Insufficient documentation

## 2015-10-04 DIAGNOSIS — E1100 Type 2 diabetes mellitus with hyperosmolarity without nonketotic hyperglycemic-hyperosmolar coma (NKHHC): Secondary | ICD-10-CM | POA: Insufficient documentation

## 2015-10-04 DIAGNOSIS — I10 Essential (primary) hypertension: Secondary | ICD-10-CM | POA: Insufficient documentation

## 2015-10-04 DIAGNOSIS — Z79899 Other long term (current) drug therapy: Secondary | ICD-10-CM | POA: Insufficient documentation

## 2015-10-04 DIAGNOSIS — Z7982 Long term (current) use of aspirin: Secondary | ICD-10-CM | POA: Insufficient documentation

## 2015-10-04 LAB — BASIC METABOLIC PANEL
ANION GAP: 15 (ref 5–15)
BUN: 15 mg/dL (ref 6–20)
CO2: 22 mmol/L (ref 22–32)
Calcium: 9 mg/dL (ref 8.9–10.3)
Chloride: 88 mmol/L — ABNORMAL LOW (ref 101–111)
Creatinine, Ser: 1.19 mg/dL — ABNORMAL HIGH (ref 0.44–1.00)
GFR calc Af Amer: >60 mL/min (ref >60)
GFR, EST NON AFRICAN AMERICAN: 52 mL/min — AB (ref >60)
GLUCOSE: 779 mg/dL — AB (ref 65–99)
POTASSIUM: 4.7 mmol/L (ref 3.5–5.1)
Sodium: 125 mmol/L — ABNORMAL LOW (ref 135–145)

## 2015-10-04 LAB — URINALYSIS COMPLETE WITH MICROSCOPIC (ARMC ONLY)
BACTERIA UA: NONE SEEN
BILIRUBIN URINE: NEGATIVE
Hgb urine dipstick: NEGATIVE
NITRITE: NEGATIVE
Protein, ur: NEGATIVE mg/dL
Specific Gravity, Urine: 1.026 (ref 1.005–1.030)
pH: 5 (ref 5.0–8.0)

## 2015-10-04 LAB — CBC
HEMATOCRIT: 46.8 % (ref 35.0–47.0)
HEMOGLOBIN: 14.9 g/dL (ref 12.0–16.0)
MCH: 27.5 pg (ref 26.0–34.0)
MCHC: 31.8 g/dL — AB (ref 32.0–36.0)
MCV: 86.6 fL (ref 80.0–100.0)
Platelets: 352 10*3/uL (ref 150–440)
RBC: 5.4 MIL/uL — ABNORMAL HIGH (ref 3.80–5.20)
RDW: 11.9 % (ref 11.5–14.5)
WBC: 11 10*3/uL (ref 3.6–11.0)

## 2015-10-04 LAB — GLUCOSE, CAPILLARY: GLUCOSE-CAPILLARY: 570 mg/dL — AB (ref 65–99)

## 2015-10-04 MED ORDER — SODIUM CHLORIDE 0.9 % IV BOLUS (SEPSIS)
1000.0000 mL | Freq: Once | INTRAVENOUS | Status: AC
  Administered 2015-10-05: 1000 mL via INTRAVENOUS

## 2015-10-04 MED ORDER — SODIUM CHLORIDE 0.9 % IV BOLUS (SEPSIS)
1000.0000 mL | Freq: Once | INTRAVENOUS | Status: AC
  Administered 2015-10-04: 1000 mL via INTRAVENOUS

## 2015-10-04 NOTE — ED Notes (Addendum)
Pt present to triage room in wheelchair. Pt presents to ED with c/o dizziness, nausea and vomiting x 2 since yesterday and headache today. States lisinopril added to pt medication x 2-3 weeks ago. Increased dizziness with change in position. Pt denies chest pain or abdominal pain. Pt alert and oriented x 4, no increased work in breathing noted.

## 2015-10-04 NOTE — ED Notes (Signed)
Russia Scheiderer RN ambulated the Pt to the bedside commode to void. Pt returned to her bed without difficulty.  

## 2015-10-04 NOTE — ED Notes (Signed)
BG 570.

## 2015-10-04 NOTE — ED Provider Notes (Signed)
[facility] Emergency Department Provider Note   ____________________________________________  Time seen:  I have reviewed the triage vital signs and the triage nursing note.  HISTORY  Chief Complaint Dizziness   Historian Patient  HPI Erin Hayes is a 50 y.o. female with a history of type 2 diabetes for which she takesglipizide and metformin, states that she started having nausea and dizziness, headache and vision changes since yesterday. No chest pain or trouble breathing. Mild decreased by mouth intake. No diarrhea. No fever. No chest pain or abdominal pain.  Dizziness mild and somewhat positional feels like lightheadedness. No coordination problems. No focal weakness or numbness. No confusion altered mental status.      Past Medical History  Diagnosis Date  . Diabetes ([place])   . Hypertension   . GERD (gastroesophageal reflux disease)   . Non-smoker     Patient Active Problem List   Diagnosis Date Noted  . Hypokalemia 04/28/2013  . Leukocytosis, unspecified 04/28/2013  . Encephalopathy 04/28/2013  . DKA (diabetic ketoacidoses) ([place]) 04/26/2013  . Diabetes ([place])   . Hypertension     Past Surgical History  Procedure Laterality Date  . Lithotripsy      Current Outpatient Rx  Name  Route  Sig  Dispense  Refill  . aspirin 81 MG tablet   Oral   Take 81 mg by mouth daily.         [name] glipiZIDE (GLUCOTROL) 5 MG tablet   Oral   Take 1 tablet (5 mg total) by mouth 2 (two) times daily.   180 tablet   1   . hydrochlorothiazide (HYDRODIURIL) 25 MG tablet   Oral   Take 1 tablet (25 mg total) by mouth daily.   90 tablet   1   . lisinopril (PRINIVIL,ZESTRIL) 20 MG tablet   Oral   Take 1 tablet (20 mg total) by mouth daily.   90 tablet   0   . metFORMIN (GLUCOPHAGE) 500 MG tablet   Oral   Take 1 tablet (500 mg total) by mouth 2 (two) times daily with a meal.   180 tablet   1     Allergies Review of patient's allergies  indicates no known allergies.  Family History  Problem Relation Age of Onset  . Hypertension Father     Social History Social History  Substance Use Topics  . Smoking status: Never Smoker   . Smokeless tobacco: None  . Alcohol Use: No    Review of Systems  Constitutional: Negative for fever. Eyes: Negative for visual changes. ENT: Negative for sore throat. Cardiovascular: Negative for chest pain. Respiratory: Negative for shortness of breath. Gastrointestinal: Negative for abdominal pain. Genitourinary: Negative for dysuria. Musculoskeletal: Negative for back pain. Skin: Negative for rash. Neurological: Positive for mild global headache. 10 point Review of Systems otherwise negative ____________________________________________   PHYSICAL EXAM:  VITAL SIGNS: ED Triage Vitals  Enc Vitals Group     BP 10/04/15 2029 102/80 mmHg     Pulse Rate 10/04/15 2029 115     Resp 10/04/15 2029 18     Temp 10/04/15 2029 98.9 F (37.2 C)     Temp Source 10/04/15 2029 Oral     SpO2 10/04/15 2029 98 %     Weight 10/04/15 2029 109 lb (49.442 kg)     Height 10/04/15 2029 4\' 11"  (1.499 m)     Head Cir --      Peak Flow --      Pain  Score 10/04/15 2030 5     Pain Loc --      Pain Edu? --      Excl. in [place]? --      Constitutional: Alert and oriented. Well appearing and in no distress. HEENT   Head: Normocephalic and atraumatic.      Eyes: Conjunctivae are normal. PERRL. Normal extraocular movements.      Ears:         Nose: No congestion/rhinnorhea.   Mouth/Throat: Mucous membranes are Moderately dry.   Neck: No stridor. Cardiovascular/Chest: Tachycardic, regular rhythm.  No murmurs, rubs, or gallops. Respiratory: Normal respiratory effort without tachypnea nor retractions. Breath sounds are clear and equal bilaterally. No wheezes/rales/rhonchi. Gastrointestinal: Soft. No distention, no guarding, no rebound. Nontender.     Genitourinary/rectal:Deferred Musculoskeletal: Nontender with normal range of motion in all extremities. No joint effusions.  No lower extremity tenderness.  No edema. Neurologic:  Normal speech and language. No gross or focal neurologic deficits are appreciated. Skin:  Skin is warm, dry and intact. No rash noted. Psychiatric: Mood and affect are normal. Speech and behavior are normal. Patient exhibits appropriate insight and judgment.  ____________________________________________   EKG I, Lisa Roca, MD, the attending physician have personally viewed and interpreted all ECGs.  111 bpm. Sinus tachycardia. Nonspecific intraventricular conduction delay. Normal axis. Nonspecific ST and T-wave with some T-wave inversion/minimal ST depression in V5 and V6 ____________________________________________  LABS (pertinent positives/negatives)  Labs Reviewed  BASIC METABOLIC PANEL - Abnormal; Notable for the following:    Sodium 125 (*)    Chloride 88 (*)    Glucose, Bld 779 (*)    Creatinine, Ser 1.19 (*)    GFR calc non Af Amer 52 (*)    All other components within normal limits  CBC - Abnormal; Notable for the following:    RBC 5.40 (*)    MCHC 31.8 (*)    All other components within normal limits  URINALYSIS COMPLETEWITH MICROSCOPIC ([facility] ONLY) - Abnormal; Notable for the following:    Color, Urine YELLOW (*)    APPearance CLEAR (*)    Glucose, UA >500 (*)    Ketones, ur 1+ (*)    Leukocytes, UA TRACE (*)    Squamous Epithelial / LPF 0-5 (*)    All other components within normal limits  TROPONIN I  BLOOD GAS, VENOUS  CBG MONITORING, ED    ____________________________________________  RADIOLOGY All Xrays were viewed by me. Imaging interpreted by Radiologist.  None __________________________________________  PROCEDURES  Procedure(s) performed: None  Critical Care performed: None  ____________________________________________   ED COURSE / ASSESSMENT AND  PLAN  Pertinent labs & imaging results that were available during my care of the patient were reviewed by me and considered in my medical decision making (see chart for details).   On fingerstick patient was critical high and on laboratory studies patient was found have a blood sugar of 779.  Her symptoms of vision blurriness, intermittent lightheadedness, mild jaundice headache, nausea do seem likely symptomatic from the elevated blood sugars. Intact and nonfocal neurologic exam I'm not suspicious for an acute stroke. She is not complaining of chest discomfort, but her EKG does have some nonspecific findings.  Troponin was added on.  I'm concerned about hyperosmolar nonketotic state. Patient will be given 2 L normal saline then recheck the blood sugar.  I anticipate likely hospital admission for insulin drip. Patient care transferred to Dr. Owens Shark at shift change 11:50 PM.Patient completed one liter, troponin pending.  CONSULTATIONS:   none   Patient / Family / Caregiver informed of clinical course, medical decision-making process, and agree with plan.    ___________________________________________   FINAL CLINICAL IMPRESSION(S) / ED DIAGNOSES   Final diagnoses:  Hyperosmolar non-ketotic state in patient with type 2 diabetes mellitus ([place])              Note: This dictation was prepared with Dragon dictation. Any transcriptional errors that result from this process are unintentional   Lisa Roca, MD 10/05/15 0000

## 2015-10-05 LAB — GLUCOSE, CAPILLARY
GLUCOSE-CAPILLARY: 284 mg/dL — AB (ref 65–99)
Glucose-Capillary: 473 mg/dL — ABNORMAL HIGH (ref 65–99)
Glucose-Capillary: 350 mg/dL — ABNORMAL HIGH (ref 65–99)

## 2015-10-05 LAB — TROPONIN I: Troponin I: <0.03 ng/mL (ref <0.031)

## 2015-10-05 MED ORDER — INSULIN ASPART 100 UNIT/ML ~~LOC~~ SOLN
8.0000 [IU] | Freq: Once | SUBCUTANEOUS | Status: AC
  Administered 2015-10-05: 8 [IU] via SUBCUTANEOUS

## 2015-10-05 NOTE — ED Notes (Signed)
Pt given water to drink at this time, pt sitting up tolerating well. NAD noted.

## 2015-10-05 NOTE — Discharge Instructions (Signed)
Hyperglycemic Hyperosmolar State Hyperglycemic hyperosmolar state is a serious condition in which you experience an extreme increase in your blood sugar (glucose) level. This makes your body become extremely dehydrated, which can be life threatening.  This condition usually happens to people with type 2 diabetes mellitus. It may occur over a period of days or even weeks. It may occur in those who have not been diagnosed with diabetes mellitus or in those people who have not been able to control their diabetes mellitus for various reasons. Normally your kidneys try to get rid of extra glucose through urine. However, if you do not drink enough fluids or if you drink fluids that contain sugar, your kidneys will no longer be able to get rid of the extra glucose and your blood glucose levels will increase. CAUSES  This condition may be brought on by:  Infection.  Use of medicines that cause you to become dehydrated or cause you to lose fluid.  Other illnesses.  Not taking your diabetes medication. RISK FACTORS  Older age.  Poor management of diabetes mellitus.  Inability to eat or drink normally.  Heart failure.  Infection.  Surgery.  Illness. SYMPTOMS   Extreme or increased thirst (although this may gradually disappear).  Increased urination.  Dry, parched mouth.  Warm, dry skin that does not sweat.  High fever.  Sleepiness or confusion.  Vision problems or vision loss.  Seeing or hearing things that are not there (hallucinations).  Weakness on one side of the body. DIAGNOSIS  Hyperglycemic hyperosmolar state is diagnosed with a medical history and evaluation of laboratory test results.  TREATMENT  The goal of treatment is to correct the dehydration. Fluids are replaced through an IV tube.  HOME CARE INSTRUCTIONS  Check your blood glucose level regularly.  Talk with your health care provider about when to check your blood glucose level and what the numbers  mean.  Check your blood glucose level more often when you are sick. Ask your health care provider about your sick day plan.  Always stay well hydrated especially when you are ill, exercising, or you are out in the heat. SEEK MEDICAL CARE IF:  You are ill and cannot eat or drink.  You develop a fever. SEEK IMMEDIATE MEDICAL CARE IF: You develop any of the symptoms listed above of hyperglycemic hyperosmolar state. MAKE SURE YOU:  Understand these instructions.   Will watch your condition.   Will get help right away if you are not doing well or get worse.   This information is not intended to replace advice given to you by your health care provider. Make sure you discuss any questions you have with your health care provider.   Document Released: 02/24/2004 Document Revised: 05/14/2014 Document Reviewed: 09/30/2012 Elsevier Interactive Patient Education [employer].

## 2015-10-05 NOTE — ED Notes (Signed)
BG 350

## 2015-10-05 NOTE — ED Notes (Signed)
Rt called regarding blood gas, will come and pick up momentarily.

## 2015-10-05 NOTE — ED Provider Notes (Signed)
Patient evaluated by Dr. Estanislado Pandy hospitalist who stated that the patient was safe for discharge given decrease in patient's glucose. Current glucose 284.  Gregor Hams, MD 10/06/15 [phone]

## 2015-10-05 NOTE — ED Notes (Signed)
Pt ambulated to bathroom at this time with NAD noted.

## 2015-10-05 NOTE — ED Notes (Signed)
Pt's BG is 473.

## 2015-10-06 LAB — BLOOD GAS, VENOUS
Acid-base deficit: 1.2 mmol/L (ref 0.0–2.0)
Bicarbonate: 26.4 mEq/L (ref 21.0–28.0)
PCO2 VEN: 55 mmHg (ref 44.0–60.0)
PH VEN: 7.29 — AB (ref 7.320–7.430)
Patient temperature: 37

## 2015-10-11 ENCOUNTER — Ambulatory Visit: Payer: Self-pay

## 2015-10-12 ENCOUNTER — Ambulatory Visit: Payer: Self-pay

## 2015-10-13 ENCOUNTER — Ambulatory Visit: Payer: Self-pay

## 2015-10-14 MED ORDER — METFORMIN HCL 500 MG PO TABS: 500.0000 mg | ORAL_TABLET | Freq: Two times a day (BID) | ORAL | Status: DC

## 2015-10-14 MED ORDER — HYDROCHLOROTHIAZIDE 25 MG PO TABS: 25.0000 mg | ORAL_TABLET | Freq: Every day | ORAL | Status: DC

## 2015-10-14 MED ORDER — GLIPIZIDE 5 MG PO TABS
5.0000 mg | ORAL_TABLET | Freq: Two times a day (BID) | ORAL | Status: DC
Start: 2015-10-14 — End: 2016-10-31

## 2015-10-14 MED ORDER — LISINOPRIL 20 MG PO TABS: 20.0000 mg | ORAL_TABLET | Freq: Every day | ORAL | Status: DC

## 2015-10-17 ENCOUNTER — Other Ambulatory Visit: Payer: Self-pay | Admitting: Urology

## 2015-10-17 DIAGNOSIS — I10 Essential (primary) hypertension: Secondary | ICD-10-CM

## 2015-10-17 DIAGNOSIS — E119 Type 2 diabetes mellitus without complications: Secondary | ICD-10-CM

## 2015-10-18 ENCOUNTER — Ambulatory Visit: Payer: Self-pay | Admitting: Nurse Practitioner

## 2015-10-18 VITALS — BP 183/129 | HR 93 | Ht 59.0 in | Wt 103.0 lb

## 2015-10-18 DIAGNOSIS — E119 Type 2 diabetes mellitus without complications: Secondary | ICD-10-CM

## 2015-10-18 LAB — POCT CBG (FASTING - GLUCOSE)-MANUAL ENTRY: GLUCOSE FASTING, POC: 514 mg/dL — AB (ref 70–99)

## 2015-10-18 MED ORDER — INSULIN GLARGINE 100 UNIT/ML ~~LOC~~ SOLN: 15.0000 [IU] | Freq: Every day | SUBCUTANEOUS | Status: DC

## 2015-10-18 MED ORDER — AMLODIPINE BESYLATE 5 MG PO TABS: 5.0000 mg | ORAL_TABLET | Freq: Every day | ORAL | Status: DC
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
## 2015-10-27 ENCOUNTER — Ambulatory Visit: Payer: Self-pay

## 2015-11-01 ENCOUNTER — Ambulatory Visit: Payer: Self-pay

## 2015-11-02 ENCOUNTER — Encounter: Payer: Self-pay | Admitting: Pharmacist

## 2015-11-22 ENCOUNTER — Ambulatory Visit: Payer: Self-pay | Admitting: Physician Assistant

## 2015-11-22 VITALS — BP 191/141 | Wt 102.0 lb

## 2015-11-22 DIAGNOSIS — E119 Type 2 diabetes mellitus without complications: Secondary | ICD-10-CM

## 2015-11-22 DIAGNOSIS — I10 Essential (primary) hypertension: Secondary | ICD-10-CM

## 2015-11-22 MED ORDER — INSULIN GLARGINE 100 UNIT/ML ~~LOC~~ SOLN: 25.0000 [IU] | Freq: Every day | SUBCUTANEOUS | Status: DC

## 2015-11-22 MED ORDER — LISINOPRIL 20 MG PO TABS: 40.0000 mg | ORAL_TABLET | Freq: Every day | ORAL | Status: DC

## 2015-11-22 NOTE — Progress Notes (Signed)
Assessment:   1. Type 2 diabetes mellitus without complication, without long-term current use of insulin (HCC)  - insulin glargine (LANTUS) 100 UNIT/ML injection; Inject 0.25 mLs (25 Units total) into the skin at bedtime.  Dispense: 10 mL; Refill: 11  2. Essential hypertension Increase lisinopril to 40 mg qHS      Plan:    1.  Rx changes: increase Lantus from 15u to 25 units. Consider switch to [place] if hypoglycemia. May try substituting DPP4-I  Or GLP1 for SU next visit as well.  2.  Education: Reviewed diabetes management:  Appropriate A1C target, avoid hypoglycemia,  blood pressure (<140/80), and cholesterol (LDL <100). -   Reviewed importance of good glycemic control to reduce risk for complications.   3.   Get regular physical activity at least 30 minutes a day of moderate intensity most days of the week  This discussion took at least 15 minutes out of a total visit time of 25 minutes today.  4. Follow up: I recommend patient follow-up with me at 3 months.   5. Referrals: none     Subjective:    Erin Hayes is a 50 y.o. female who is seen  forType 2 diabetes x about 1-2 years.Erin Hayes attended the shared medical appointment. Topics that were discussed tonight include diet, exercise, taking medications, blood glucose pattern recognition, meaning of laboratory results. Additional topics included: insulin dose.   The patient's history was reviewed and updated as appropriate.  No Known Allergies   Current outpatient prescriptions:  .  amLODipine (NORVASC) 5 MG tablet, Take 1 tablet (5 mg total) by mouth daily., Disp: 90 tablet, Rfl: 1 .  aspirin 81 MG tablet, Take 81 mg by mouth daily., Disp: , Rfl:  .  glipiZIDE (GLUCOTROL) 5 MG tablet, Take 1 tablet (5 mg total) by mouth 2 (two) times daily., Disp: 180 tablet, Rfl: 1 .  hydrochlorothiazide (HYDRODIURIL) 25 MG tablet, Take 1 tablet (25 mg total) by mouth daily., Disp: 90 tablet, Rfl: 1 .  insulin  glargine (LANTUS) 100 UNIT/ML injection, Inject 0.25 mLs (25 Units total) into the skin at bedtime., Disp: 10 mL, Rfl: 11 .  lisinopril (PRINIVIL,ZESTRIL) 20 MG tablet, Take 1 tablet (20 mg total) by mouth daily., Disp: 90 tablet, Rfl: 0 .  metFORMIN (GLUCOPHAGE) 500 MG tablet, Take 1 tablet (500 mg total) by mouth 2 (two) times daily with a meal., Disp: 180 tablet, Rfl: 1 .  glipiZIDE (GLUCOTROL) 5 MG tablet, Take 1 tablet (5 mg total) by mouth 2 (two) times daily. (Patient not taking: Reported on 10/05/2015), Disp: 180 tablet, Rfl: 1 .  hydrochlorothiazide (HYDRODIURIL) 25 MG tablet, Take 1 tablet (25 mg total) by mouth daily., Disp: 90 tablet, Rfl: 1 .  lisinopril (PRINIVIL,ZESTRIL) 20 MG tablet, Take 1 tablet (20 mg total) by mouth daily. (Patient not taking: Reported on 10/05/2015), Disp: 90 tablet, Rfl: 0 .  metFORMIN (GLUCOPHAGE) 500 MG tablet, Take 1 tablet (500 mg total) by mouth 2 (two) times daily with a meal., Disp: 180 tablet, Rfl: 1  Social History   Social History  . Marital Status: Single    Spouse Name: N/A  . Number of Children: N/A  . Years of Education: N/A   Social History Main Topics  . Smoking status: Never Smoker   . Smokeless tobacco: Not on file  . Alcohol Use: No  . Drug Use: No  . Sexual Activity: Not on file   Other Topics Concern  . Not on  file   Social History Narrative    Family History  Problem Relation Age of Onset  . Hypertension Father     Review of Systems A 12 point review of systems was negative except for pertinent items noted in the HPI.   Objective:     Wt Readings from Last 3 Encounters:  10/18/15 103 lb (46.72 kg)  10/04/15 109 lb (49.442 kg)  09/21/15 109 lb (49.442 kg)   There were no vitals taken for this visit.  General appearance:  alert and appears stated age, in no distress      Eyes:  conjunctivae/corneas clear. EOM's intact. Sclera anicteric. Negative lid lag or proptosis     Neck: no adenopathy, supple,  symmetrical, trachea midline  Thyroid:  Mobile, normal size, no palpable nodule  Lung: clear to auscultation bilaterally  Heart:  regular rate and rhythm, S1, S2 normal, no murmur, click, rub or gallop  Abdomen:  bowel sounds normal; negative bruits  Extremities: extremities normal, atraumatic, no cyanosis or edema     Pulses: DP & PT 2+ and symmetric.  Neuro: normal without focal findings, mental status, speech normal, alert and oriented x3, reflexes normal and symmetric and gait normal.   Feet: negative lesions or ulcers, 10 gram monofilament intact bilateral plantar surface     Lab Review No components found for: A1C GLUCOSE (mg/dL)  Date Value  04/25/2013 370*   GLUCOSE, BLD (mg/dL)  Date Value  10/04/2015 779*  05/05/2015 482*  11/04/2014 353*   CO2 (mmol/L)  Date Value  10/04/2015 22  05/05/2015 26  11/04/2014 25  04/25/2013 12*   BUN (mg/dL)  Date Value  10/04/2015 15  05/05/2015 13  11/04/2014 30*  04/25/2013 17   CREATININE (mg/dL)  Date Value  04/25/2013 0.87   CREATININE, SER (mg/dL)  Date Value  10/04/2015 1.19*  05/05/2015 0.98  11/04/2014 1.53*   No components found for: LDL,  LDLCALC,  LDLDIRECT Lab Results  Component Value Date   NA 125* 10/04/2015   K 4.7 10/04/2015   CL 88* 10/04/2015   CO2 22 10/04/2015   BUN 15 10/04/2015   CREATININE 1.19* 10/04/2015   GFRAA >60 10/04/2015   GFRNONAA 52* 10/04/2015   CALCIUM 9.0 10/04/2015   ALBUMIN 4.6 11/04/2014   PHOS 3.6 04/28/2013     DIABETES MELLITUS RESULTS: Lab Results  Component Value Date   HGBA1C 18.3* 04/26/2013   Lab Results  Component Value Date   CREATININE 1.19* 10/04/2015   No results found for: CHOL No results found for: LDLCALC No components found for: CHOLLLDLDIRECT No components found for: MICROALB/CR Lab Results  Component Value Date   GFRAA >60 10/04/2015   GFRNONAA 52* 10/04/2015

## 2015-11-23 ENCOUNTER — Encounter: Payer: Self-pay | Admitting: Specialist

## 2015-12-06 ENCOUNTER — Ambulatory Visit: Payer: Self-pay

## 2016-02-01 ENCOUNTER — Encounter: Payer: Self-pay | Admitting: Specialist

## 2016-02-15 ENCOUNTER — Encounter: Payer: Self-pay | Admitting: Specialist

## 2016-03-07 ENCOUNTER — Ambulatory Visit: Payer: Self-pay | Admitting: Internal Medicine

## 2016-07-10 ENCOUNTER — Telehealth: Payer: Self-pay

## 2016-07-10 NOTE — Telephone Encounter (Signed)
Received PAP application from [facility] for Lantus placed for provider to sign. 

## 2016-07-11 NOTE — Telephone Encounter (Signed)
Placed signed application/script in [facility] folder for pickup. 

## 2016-07-13 ENCOUNTER — Telehealth: Payer: Self-pay | Admitting: Pharmacist

## 2016-07-13 NOTE — Telephone Encounter (Signed)
Faxed refill request to [employer] for Lantus Solostar Inject 25 units into the skin daily at bedtime.

## 2016-09-01 ENCOUNTER — Emergency Department: Payer: Self-pay

## 2016-09-01 ENCOUNTER — Emergency Department
Admission: EM | Admit: 2016-09-01 | Discharge: 2016-09-01 | Disposition: A | Discharge status: Home / Self Care | Payer: Self-pay | Attending: Emergency Medicine | Admitting: Emergency Medicine

## 2016-09-01 DIAGNOSIS — Z7982 Long term (current) use of aspirin: Secondary | ICD-10-CM | POA: Insufficient documentation

## 2016-09-01 DIAGNOSIS — Y92410 Unspecified street and highway as the place of occurrence of the external cause: Secondary | ICD-10-CM | POA: Insufficient documentation

## 2016-09-01 DIAGNOSIS — Y9389 Activity, other specified: Secondary | ICD-10-CM | POA: Insufficient documentation

## 2016-09-01 DIAGNOSIS — M7918 Myalgia, other site: Secondary | ICD-10-CM

## 2016-09-01 DIAGNOSIS — Z79899 Other long term (current) drug therapy: Secondary | ICD-10-CM | POA: Insufficient documentation

## 2016-09-01 DIAGNOSIS — I10 Essential (primary) hypertension: Secondary | ICD-10-CM | POA: Insufficient documentation

## 2016-09-01 DIAGNOSIS — Y999 Unspecified external cause status: Secondary | ICD-10-CM | POA: Insufficient documentation

## 2016-09-01 DIAGNOSIS — E119 Type 2 diabetes mellitus without complications: Secondary | ICD-10-CM | POA: Insufficient documentation

## 2016-09-01 DIAGNOSIS — Z794 Long term (current) use of insulin: Secondary | ICD-10-CM | POA: Insufficient documentation

## 2016-09-01 DIAGNOSIS — R0789 Other chest pain: Secondary | ICD-10-CM | POA: Insufficient documentation

## 2016-09-01 MED ORDER — ACETAMINOPHEN 325 MG PO TABS
650.0000 mg | ORAL_TABLET | Freq: Once | ORAL | Status: AC
Start: 2016-09-01 — End: 2016-09-01
  Administered 2016-09-01: 650 mg via ORAL
  Filled 2016-09-01: qty 2
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
## 2016-09-01 NOTE — ED Notes (Signed)
Transported to XR  
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
## 2016-10-02 ENCOUNTER — Emergency Department
Admission: EM | Admit: 2016-10-02 | Discharge: 2016-10-03 | Disposition: A | Discharge status: Home / Self Care | Payer: Self-pay | Attending: Emergency Medicine | Admitting: Emergency Medicine

## 2016-10-02 ENCOUNTER — Emergency Department: Payer: Self-pay

## 2016-10-02 ENCOUNTER — Encounter: Payer: Self-pay | Admitting: Emergency Medicine

## 2016-10-02 DIAGNOSIS — R7989 Other specified abnormal findings of blood chemistry: Secondary | ICD-10-CM | POA: Insufficient documentation

## 2016-10-02 DIAGNOSIS — R197 Diarrhea, unspecified: Secondary | ICD-10-CM | POA: Insufficient documentation

## 2016-10-02 DIAGNOSIS — Z7984 Long term (current) use of oral hypoglycemic drugs: Secondary | ICD-10-CM | POA: Insufficient documentation

## 2016-10-02 DIAGNOSIS — Z79899 Other long term (current) drug therapy: Secondary | ICD-10-CM | POA: Insufficient documentation

## 2016-10-02 DIAGNOSIS — R634 Abnormal weight loss: Secondary | ICD-10-CM | POA: Insufficient documentation

## 2016-10-02 DIAGNOSIS — E119 Type 2 diabetes mellitus without complications: Secondary | ICD-10-CM | POA: Insufficient documentation

## 2016-10-02 DIAGNOSIS — Z794 Long term (current) use of insulin: Secondary | ICD-10-CM | POA: Insufficient documentation

## 2016-10-02 DIAGNOSIS — I10 Essential (primary) hypertension: Secondary | ICD-10-CM | POA: Insufficient documentation

## 2016-10-02 LAB — URINALYSIS, COMPLETE (UACMP) WITH MICROSCOPIC
Bilirubin Urine: NEGATIVE
GLUCOSE, UA: NEGATIVE mg/dL
Hgb urine dipstick: NEGATIVE
Ketones, ur: NEGATIVE mg/dL
Nitrite: NEGATIVE
PROTEIN: 30 mg/dL — AB
Specific Gravity, Urine: 1.012 (ref 1.005–1.030)
pH: 6 (ref 5.0–8.0)

## 2016-10-02 LAB — BASIC METABOLIC PANEL
Anion gap: 6 (ref 5–15)
BUN: 19 mg/dL (ref 6–20)
CALCIUM: 7.5 mg/dL — AB (ref 8.9–10.3)
CO2: 20 mmol/L — AB (ref 22–32)
CREATININE: 0.88 mg/dL (ref 0.44–1.00)
Chloride: 108 mmol/L (ref 101–111)
Glucose, Bld: 232 mg/dL — ABNORMAL HIGH (ref 65–99)
Potassium: 3.3 mmol/L — ABNORMAL LOW (ref 3.5–5.1)
SODIUM: 134 mmol/L — AB (ref 135–145)

## 2016-10-02 LAB — COMPREHENSIVE METABOLIC PANEL
ALBUMIN: 3.7 g/dL (ref 3.5–5.0)
ALK PHOS: 94 U/L (ref 38–126)
ALT: 28 U/L (ref 14–54)
ANION GAP: 9 (ref 5–15)
AST: 29 U/L (ref 15–41)
BUN: 23 mg/dL — AB (ref 6–20)
CO2: 20 mmol/L — AB (ref 22–32)
Calcium: 8.4 mg/dL — ABNORMAL LOW (ref 8.9–10.3)
Chloride: 107 mmol/L (ref 101–111)
Creatinine, Ser: 1.02 mg/dL — ABNORMAL HIGH (ref 0.44–1.00)
GFR calc Af Amer: >60 mL/min (ref >60)
GFR calc non Af Amer: >60 mL/min (ref >60)
GLUCOSE: 169 mg/dL — AB (ref 65–99)
POTASSIUM: 2.2 mmol/L — AB (ref 3.5–5.1)
SODIUM: 136 mmol/L (ref 135–145)
Total Bilirubin: 1 mg/dL (ref 0.3–1.2)
Total Protein: 6.5 g/dL (ref 6.5–8.1)

## 2016-10-02 LAB — CBC
HEMATOCRIT: 40.1 % (ref 35.0–47.0)
HEMOGLOBIN: 13.6 g/dL (ref 12.0–16.0)
MCH: 27.8 pg (ref 26.0–34.0)
MCHC: 33.8 g/dL (ref 32.0–36.0)
MCV: 82.3 fL (ref 80.0–100.0)
Platelets: 326 10*3/uL (ref 150–440)
RBC: 4.87 MIL/uL (ref 3.80–5.20)
RDW: 12.7 % (ref 11.5–14.5)
WBC: 10.9 10*3/uL (ref 3.6–11.0)

## 2016-10-02 LAB — LIPASE, BLOOD: Lipase: 40 U/L (ref 11–51)

## 2016-10-02 LAB — MAGNESIUM: MAGNESIUM: 1.8 mg/dL (ref 1.7–2.4)

## 2016-10-02 MED ORDER — POTASSIUM CHLORIDE 20 MEQ/15ML (10%) PO SOLN
40.0000 meq | Freq: Once | ORAL | Status: AC
  Administered 2016-10-02: 40 meq via ORAL
  Filled 2016-10-02: qty 30

## 2016-10-02 MED ORDER — SODIUM CHLORIDE 0.9 % IV SOLN
Freq: Once | INTRAVENOUS | Status: AC
  Administered 2016-10-02: 17:00:00 via INTRAVENOUS

## 2016-10-02 MED ORDER — IOPAMIDOL (ISOVUE-300) INJECTION 61%
15.0000 mL | INTRAVENOUS | Status: AC
  Administered 2016-10-02 (×2): 15 mL via ORAL

## 2016-10-02 MED ORDER — KCL IN DEXTROSE-NACL 40-5-0.45 MEQ/L-%-% IV SOLN
INTRAVENOUS | Status: DC
  Administered 2016-10-02: 19:00:00 via INTRAVENOUS
  Filled 2016-10-02: qty 1000

## 2016-10-02 MED ORDER — IOPAMIDOL (ISOVUE-300) INJECTION 61%
75.0000 mL | Freq: Once | INTRAVENOUS | Status: AC | PRN
  Administered 2016-10-02: 75 mL via INTRAVENOUS

## 2016-10-02 NOTE — ED Notes (Signed)
Pt noted to be in NAD at this time. Pt's friend at bedside at this time. Will continue to monitor for further patient needs. Pt instructed to give a stool sample when able.

## 2016-10-02 NOTE — ED Notes (Signed)
MD Malinda at bedside with update.

## 2016-10-02 NOTE — ED Triage Notes (Addendum)
Pt c/o diarrhea X 4 days. Pt having multiple episodes per day. Denies vomiting or pain.  No fevers.  Has been dizzy. VSS.  NAD

## 2016-10-02 NOTE — ED Provider Notes (Signed)
[facility] Emergency Department Provider Note   ____________________________________________   First MD Initiated Contact with Patient 10/02/16 1624     (approximate)  I have reviewed the triage vital signs and the nursing notes.   HISTORY  Chief Complaint Diarrhea    HPI Erin Hayes is a 51 y.o. female who reports she's been having frequent episodes watery diarrhea for at least 2 months. She has no pain with it is no blood with it. She is not running a fever. Nothing in particular seems to bring it on or make it better or worse. She does have diabetes..   Past Medical History:  Diagnosis Date  . Diabetes ([place])   . GERD (gastroesophageal reflux disease)   . Hypertension   . Non-smoker     Patient Active Problem List   Diagnosis Date Noted  . Hypokalemia 04/28/2013  . Leukocytosis, unspecified 04/28/2013  . Encephalopathy 04/28/2013  . DKA (diabetic ketoacidoses) ([place]) 04/26/2013  . Diabetes ([place])   . Hypertension     Past Surgical History:  Procedure Laterality Date  . LITHOTRIPSY      Prior to Admission medications   Medication Sig Start Date End Date Taking? Authorizing Provider  amLODipine (NORVASC) 5 MG tablet Take 1 tablet (5 mg total) by mouth daily. 10/18/15  Yes Odem, Coolidge Breeze, FNP  aspirin 81 MG tablet Take 81 mg by mouth daily.   Yes [provider]  glipiZIDE (GLUCOTROL) 5 MG tablet Take 1 tablet (5 mg total) by mouth 2 (two) times daily. 10/14/15 10/02/16 Yes McGowan, Larene Beach A, PA-C  hydrochlorothiazide (HYDRODIURIL) 25 MG tablet Take 1 tablet (25 mg total) by mouth daily. 10/14/15  Yes McGowan, Larene Beach A, PA-C  insulin glargine (LANTUS) 100 UNIT/ML injection Inject 0.25 mLs (25 Units total) into the skin at bedtime. 11/22/15  Yes Bertram Gala, PA  metFORMIN (GLUCOPHAGE) 500 MG tablet Take 1 tablet (500 mg total) by mouth 2 (two) times daily with a meal. 10/14/15  Yes McGowan, Larene Beach A, PA-C  lisinopril  (PRINIVIL,ZESTRIL) 20 MG tablet Take 2 tablets (40 mg total) by mouth at bedtime. Patient not taking: Reported on 10/02/2016 11/22/15   Bertram Gala, PA    Allergies Patient has no known allergies.  Family History  Problem Relation Age of Onset  . Hypertension Father     Social History Social History  Substance Use Topics  . Smoking status: Never Smoker  . Smokeless tobacco: Not on file  . Alcohol use No    Review of Systems  Constitutional: No fever/chills Eyes: No visual changes. ENT: No sore throat. Cardiovascular: Denies chest pain. Respiratory: Denies shortness of breath. Gastrointestinal: No abdominal pain.  No nausea, no vomiting. diarrhea.  No constipation. Genitourinary: Negative for dysuria. Musculoskeletal: Negative for back pain. Skin: Negative for rash. Neurological: Negative for headaches, focal weakness    ____________________________________________   PHYSICAL EXAM:  VITAL SIGNS: ED Triage Vitals  Enc Vitals Group     BP 10/02/16 1449 (!) 119/95     Pulse Rate 10/02/16 1449 (!) 101     Resp 10/02/16 1449 16     Temp 10/02/16 1449 98.7 F (37.1 C)     Temp Source 10/02/16 1449 Oral     SpO2 10/02/16 1449 100 %     Weight 10/02/16 1451 95 lb (43.1 kg)     Height 10/02/16 1451 4\' 11"  (1.499 m)     Head Circumference --      Peak Flow --  Pain Score --      Pain Loc --      Pain Edu? --      Excl. in [place]? --     Constitutional: Alert and oriented. Well appearing and in no acute distress. Eyes: Conjunctivae are normal.  Head: Atraumatic. Nose: No congestion/rhinnorhea. Mouth/Throat: Mucous membranes are moist.   Neck: No stridor.   Cardiovascular: Normal rate, regular rhythm. Grossly normal heart sounds.  Good peripheral circulation. Respiratory: Normal respiratory effort.  No retractions. Lungs CTAB. Gastrointestinal: Soft and nontender. No distention. No abdominal bruits. No CVA tenderness. Musculoskeletal: No lower extremity  tenderness nor edema.  No joint effusions. Neurologic:  Normal speech and language. No gross focal neurologic deficits are appreciated.  Skin:  Skin is warm, dry and intact. No rash noted. Psychiatric: Mood and affect are normal. Speech and behavior are normal.  ____________________________________________   LABS (all labs ordered are listed, but only abnormal results are displayed)  Labs Reviewed  COMPREHENSIVE METABOLIC PANEL - Abnormal; Notable for the following:       Result Value   Potassium 2.2 (*)    CO2 20 (*)    Glucose, Bld 169 (*)    BUN 23 (*)    Creatinine, Ser 1.02 (*)    Calcium 8.4 (*)    All other components within normal limits  URINALYSIS, COMPLETE (UACMP) WITH MICROSCOPIC - Abnormal; Notable for the following:    Color, Urine YELLOW (*)    APPearance HAZY (*)    Protein, ur 30 (*)    Leukocytes, UA SMALL (*)    Bacteria, UA RARE (*)    Squamous Epithelial / LPF 0-5 (*)    All other components within normal limits  BASIC METABOLIC PANEL - Abnormal; Notable for the following:    Sodium 134 (*)    Potassium 3.3 (*)    CO2 20 (*)    Glucose, Bld 232 (*)    Calcium 7.5 (*)    All other components within normal limits  C DIFFICILE QUICK SCREEN W PCR REFLEX  GASTROINTESTINAL PANEL BY PCR, STOOL (REPLACES STOOL CULTURE)  LIPASE, BLOOD  CBC  MAGNESIUM  PREGNANCY, URINE   ____________________________________________  EKG   ____________________________________________  RADIOLOGY  IMPRESSION: 1. Thickened appearance of the colonic wall most consistent with colitis. Correlation with clinical exam and stool cultures recommended. No bowel obstruction. Normal appendix. 2. Thickened appearance of the wall of the infrarenal aorta likely noncalcified plaque versus less likely sequela of chronic inflammation. No aneurysmal dilatation or dissection.   Electronically Signed   By: Anner Crete M.D.   On: 10/02/2016  20:32 ____________________________________________   PROCEDURES  Procedure(s) performed:   Procedures  Critical Care performed:  ____________________________________________   INITIAL IMPRESSION / ASSESSMENT AND PLAN / ED COURSE  Pertinent labs & imaging results that were available during my care of the patient were reviewed by me and considered in my medical decision making (see chart for details).        ____________________________________________   FINAL CLINICAL IMPRESSION(S) / ED DIAGNOSES  Final diagnoses:  Diarrhea, unspecified type      NEW MEDICATIONS STARTED DURING THIS VISIT:  New Prescriptions   No medications on file     Note:  This document was prepared using Dragon voice recognition software and may include unintentional dictation errors.    Nena Polio, MD 10/02/16 [phone]

## 2016-10-02 NOTE — ED Notes (Signed)
Pt to CT at this time.

## 2016-10-02 NOTE — Discharge Instructions (Signed)
Please follow-up with Dr.Anna. That is the gastroenterologist on-call. Their phone number is [phone].  You do have some thickening of the bowel wall which may represent some inflammation. Gastroenterology will have to look into this further and decide how to proceed with treatment. [facility] and let them know that she were in the ER with his chronic diarrhea and had some thickening of the wall of the colon and need follow-up with gastroenterology. They should be able to help arrange that for you. Please return here for pain bloody diarrhea fever or weight loss or feeling sicker. Please do not delay getting follow-up.

## 2016-10-02 NOTE — ED Notes (Addendum)
K 2.2 Agricultural consultant notified. Dr. Jimmye Norman notified.

## 2016-10-02 NOTE — ED Notes (Signed)
Pt ambulated to the bathroom with assistance from her friend. Unable to give stool sample at this time.

## 2016-10-02 NOTE — ED Notes (Signed)
Pt visualized to be asleep in bed, VSS and WNL, pt's friend remains at bedside. Will continue to monitor for further patient needs.

## 2016-10-03 LAB — PREGNANCY, URINE: Preg Test, Ur: NEGATIVE

## 2016-10-03 NOTE — ED Notes (Signed)
Pt. Verbalizes understanding of d/c instructions and follow-up. VS stable and pt denies pain.  Pt. In NAD at time of d/c and denies further concerns regarding this visit. Pt. Stable at the time of departure from the unit, departing unit by the safest and most appropriate manner per that pt condition and limitations. Pt advised to return to the ED at any time for emergent concerns, or for new/worsening symptoms.   

## 2016-10-04 ENCOUNTER — Telehealth: Payer: Self-pay | Admitting: Pharmacist

## 2016-10-04 NOTE — Telephone Encounter (Signed)
10/04/16 Faxed [employer] refill request for Lantus Solostar pen Inject 25 units (0.73mLs) into the skin daily at bedtime.

## 2016-10-25 ENCOUNTER — Telehealth: Payer: Self-pay | Admitting: Pharmacy Technician

## 2016-10-25 NOTE — Telephone Encounter (Signed)
Patient failed to provide current utility bill,[id number] signed by patient and Timmothy Sours, and social security award letter for 2018 from [place].  No additional medication assistance will be provided by [facility] without the required proof of income documentation.  Patient notified by letter.  [facility]

## 2016-10-31 ENCOUNTER — Encounter: Payer: Self-pay | Admitting: Emergency Medicine

## 2016-10-31 ENCOUNTER — Inpatient Hospital Stay
Admission: EM | Admit: 2016-10-31 | Discharge: 2016-11-07 | DRG: 682 | Disposition: A | Discharge status: Home / Self Care | Payer: Self-pay | Attending: Internal Medicine | Admitting: Internal Medicine

## 2016-10-31 DIAGNOSIS — Z79899 Other long term (current) drug therapy: Secondary | ICD-10-CM

## 2016-10-31 DIAGNOSIS — I10 Essential (primary) hypertension: Secondary | ICD-10-CM | POA: Diagnosis present

## 2016-10-31 DIAGNOSIS — Z792 Long term (current) use of antibiotics: Secondary | ICD-10-CM

## 2016-10-31 DIAGNOSIS — R197 Diarrhea, unspecified: Secondary | ICD-10-CM | POA: Diagnosis present

## 2016-10-31 DIAGNOSIS — K640 First degree hemorrhoids: Secondary | ICD-10-CM | POA: Diagnosis present

## 2016-10-31 DIAGNOSIS — E860 Dehydration: Secondary | ICD-10-CM | POA: Diagnosis present

## 2016-10-31 DIAGNOSIS — Z7982 Long term (current) use of aspirin: Secondary | ICD-10-CM

## 2016-10-31 DIAGNOSIS — N390 Urinary tract infection, site not specified: Secondary | ICD-10-CM | POA: Diagnosis present

## 2016-10-31 DIAGNOSIS — E876 Hypokalemia: Secondary | ICD-10-CM | POA: Diagnosis present

## 2016-10-31 DIAGNOSIS — R778 Other specified abnormalities of plasma proteins: Secondary | ICD-10-CM

## 2016-10-31 DIAGNOSIS — E43 Unspecified severe protein-calorie malnutrition: Secondary | ICD-10-CM | POA: Insufficient documentation

## 2016-10-31 DIAGNOSIS — K219 Gastro-esophageal reflux disease without esophagitis: Secondary | ICD-10-CM | POA: Diagnosis present

## 2016-10-31 DIAGNOSIS — N179 Acute kidney failure, unspecified: Principal | ICD-10-CM | POA: Diagnosis present

## 2016-10-31 DIAGNOSIS — E119 Type 2 diabetes mellitus without complications: Secondary | ICD-10-CM

## 2016-10-31 DIAGNOSIS — K529 Noninfective gastroenteritis and colitis, unspecified: Secondary | ICD-10-CM | POA: Diagnosis present

## 2016-10-31 DIAGNOSIS — K644 Residual hemorrhoidal skin tags: Secondary | ICD-10-CM | POA: Diagnosis present

## 2016-10-31 DIAGNOSIS — Z794 Long term (current) use of insulin: Secondary | ICD-10-CM

## 2016-10-31 DIAGNOSIS — K5730 Diverticulosis of large intestine without perforation or abscess without bleeding: Secondary | ICD-10-CM | POA: Diagnosis present

## 2016-10-31 DIAGNOSIS — Z681 Body mass index (BMI) 19 or less, adult: Secondary | ICD-10-CM

## 2016-10-31 DIAGNOSIS — R7989 Other specified abnormal findings of blood chemistry: Secondary | ICD-10-CM

## 2016-10-31 DIAGNOSIS — R531 Weakness: Secondary | ICD-10-CM

## 2016-10-31 LAB — URINALYSIS, COMPLETE (UACMP) WITH MICROSCOPIC
Bilirubin Urine: NEGATIVE
Glucose, UA: NEGATIVE mg/dL
Hgb urine dipstick: NEGATIVE
KETONES UR: 5 mg/dL — AB
Nitrite: NEGATIVE
PROTEIN: 100 mg/dL — AB
RBC / HPF: NONE SEEN RBC/hpf (ref 0–5)
Specific Gravity, Urine: 1.016 (ref 1.005–1.030)
pH: 6 (ref 5.0–8.0)

## 2016-10-31 LAB — CBC WITH DIFFERENTIAL/PLATELET
BASOS PCT: 1 %
Basophils Absolute: 0.1 10*3/uL (ref 0–0.1)
Eosinophils Absolute: 0 10*3/uL (ref 0–0.7)
Eosinophils Relative: 0 %
HEMATOCRIT: 46.1 % (ref 35.0–47.0)
HEMOGLOBIN: 15.6 g/dL (ref 12.0–16.0)
Lymphocytes Relative: 19 %
Lymphs Abs: 2.4 10*3/uL (ref 1.0–3.6)
MCH: 27.9 pg (ref 26.0–34.0)
MCHC: 33.9 g/dL (ref 32.0–36.0)
MCV: 82.4 fL (ref 80.0–100.0)
MONOS PCT: 5 %
Monocytes Absolute: 0.7 10*3/uL (ref 0.2–0.9)
NEUTROS ABS: 9.8 10*3/uL — AB (ref 1.4–6.5)
NEUTROS PCT: 75 %
Platelets: 366 10*3/uL (ref 150–440)
RBC: 5.6 MIL/uL — AB (ref 3.80–5.20)
RDW: 13.9 % (ref 11.5–14.5)
WBC: 13 10*3/uL — ABNORMAL HIGH (ref 3.6–11.0)

## 2016-10-31 LAB — COMPREHENSIVE METABOLIC PANEL
ALBUMIN: 4.1 g/dL (ref 3.5–5.0)
ALK PHOS: 86 U/L (ref 38–126)
ALT: 35 U/L (ref 14–54)
AST: 57 U/L — AB (ref 15–41)
Anion gap: 18 — ABNORMAL HIGH (ref 5–15)
BUN: 41 mg/dL — AB (ref 6–20)
CALCIUM: 9 mg/dL (ref 8.9–10.3)
CO2: 15 mmol/L — AB (ref 22–32)
CREATININE: 2.16 mg/dL — AB (ref 0.44–1.00)
Chloride: 104 mmol/L (ref 101–111)
GFR calc Af Amer: 29 mL/min — ABNORMAL LOW (ref >60)
GFR calc non Af Amer: 25 mL/min — ABNORMAL LOW (ref >60)
Glucose, Bld: 144 mg/dL — ABNORMAL HIGH (ref 65–99)
Potassium: <2 mmol/L — CL (ref 3.5–5.1)
SODIUM: 137 mmol/L (ref 135–145)
Total Bilirubin: 1.7 mg/dL — ABNORMAL HIGH (ref 0.3–1.2)
Total Protein: 7.4 g/dL (ref 6.5–8.1)

## 2016-10-31 LAB — GLUCOSE, CAPILLARY
GLUCOSE-CAPILLARY: 141 mg/dL — AB (ref 65–99)
GLUCOSE-CAPILLARY: 128 mg/dL — AB (ref 65–99)

## 2016-10-31 LAB — TROPONIN I: Troponin I: 0.05 ng/mL (ref <0.03)

## 2016-10-31 LAB — MAGNESIUM: Magnesium: 2.4 mg/dL (ref 1.7–2.4)

## 2016-10-31 LAB — LIPASE, BLOOD: Lipase: 33 U/L (ref 11–51)

## 2016-10-31 MED ORDER — DEXTROSE 5 % IV SOLN
1.0000 g | INTRAVENOUS | Status: AC
  Administered 2016-10-31: 1 g via INTRAVENOUS
  Filled 2016-10-31: qty 10

## 2016-10-31 MED ORDER — ACETAMINOPHEN 650 MG RE SUPP: 650.0000 mg | Freq: Four times a day (QID) | RECTAL | Status: DC | PRN

## 2016-10-31 MED ORDER — POTASSIUM CHLORIDE 10 MEQ/100ML IV SOLN
10.0000 meq | Freq: Once | INTRAVENOUS | Status: AC
  Administered 2016-10-31: 10 meq via INTRAVENOUS
  Filled 2016-10-31: qty 100

## 2016-10-31 MED ORDER — ASPIRIN 81 MG PO CHEW
81.0000 mg | CHEWABLE_TABLET | Freq: Every day | ORAL | Status: DC
  Administered 2016-10-31 – 2016-11-07 (×8): 81 mg via ORAL
  Filled 2016-10-31 (×8): qty 1

## 2016-10-31 MED ORDER — HEPARIN SODIUM (PORCINE) 5000 UNIT/ML IJ SOLN
5000.0000 [IU] | Freq: Three times a day (TID) | INTRAMUSCULAR | Status: DC
  Administered 2016-10-31 – 2016-11-07 (×20): 5000 [IU] via SUBCUTANEOUS
  Filled 2016-10-31 (×20): qty 1

## 2016-10-31 MED ORDER — INSULIN ASPART 100 UNIT/ML ~~LOC~~ SOLN
0.0000 [IU] | Freq: Every day | SUBCUTANEOUS | Status: DC
  Administered 2016-11-02: 2 [IU] via SUBCUTANEOUS
  Filled 2016-10-31: qty 1

## 2016-10-31 MED ORDER — POTASSIUM CHLORIDE 10 MEQ/50ML IV SOLN: 10.0000 meq | INTRAVENOUS | Status: DC | PRN

## 2016-10-31 MED ORDER — POTASSIUM CHLORIDE 20 MEQ PO PACK
40.0000 meq | PACK | ORAL | Status: AC
  Administered 2016-10-31: 40 meq via ORAL
  Filled 2016-10-31: qty 2

## 2016-10-31 MED ORDER — SODIUM CHLORIDE 0.9 % IV BOLUS (SEPSIS)
1000.0000 mL | INTRAVENOUS | Status: AC
  Administered 2016-10-31: 1000 mL via INTRAVENOUS

## 2016-10-31 MED ORDER — INSULIN ASPART 100 UNIT/ML ~~LOC~~ SOLN
0.0000 [IU] | Freq: Three times a day (TID) | SUBCUTANEOUS | Status: DC
  Administered 2016-11-01 – 2016-11-03 (×7): 1 [IU] via SUBCUTANEOUS
  Administered 2016-11-04 – 2016-11-05 (×4): 2 [IU] via SUBCUTANEOUS
  Administered 2016-11-05: 3 [IU] via SUBCUTANEOUS
  Administered 2016-11-06: 2 [IU] via SUBCUTANEOUS
  Administered 2016-11-06: 1 [IU] via SUBCUTANEOUS
  Administered 2016-11-06 – 2016-11-07 (×2): 2 [IU] via SUBCUTANEOUS
  Administered 2016-11-07: 3 [IU] via SUBCUTANEOUS
  Filled 2016-10-31 (×17): qty 1

## 2016-10-31 MED ORDER — ACETAMINOPHEN 325 MG PO TABS: 650.0000 mg | ORAL_TABLET | Freq: Four times a day (QID) | ORAL | Status: DC | PRN

## 2016-10-31 MED ORDER — CEFTRIAXONE SODIUM 2 G IJ SOLR
2.0000 g | INTRAMUSCULAR | Status: DC
  Administered 2016-11-01: 2 g via INTRAVENOUS
  Filled 2016-10-31 (×2): qty 2

## 2016-10-31 MED ORDER — SODIUM CHLORIDE 0.9 % IV SOLN
INTRAVENOUS | Status: AC
  Administered 2016-10-31 – 2016-11-01 (×2): via INTRAVENOUS

## 2016-10-31 MED ORDER — ONDANSETRON HCL 4 MG/2ML IJ SOLN: 4.0000 mg | Freq: Four times a day (QID) | INTRAMUSCULAR | Status: DC | PRN

## 2016-10-31 MED ORDER — ONDANSETRON HCL 4 MG PO TABS: 4.0000 mg | ORAL_TABLET | Freq: Four times a day (QID) | ORAL | Status: DC | PRN

## 2016-10-31 NOTE — H&P (Signed)
[place] at [place] NAME: Erin Hayes    MR#:  [id number]  DATE OF BIRTH:  08-Nov-1965  DATE OF ADMISSION:  10/31/2016  PRIMARY CARE PHYSICIAN: Patient, No Pcp Per   REQUESTING/REFERRING PHYSICIAN: Karma Greaser, MD  CHIEF COMPLAINT:   Chief Complaint  Patient presents with  . Diarrhea  . Weakness    HISTORY OF PRESENT ILLNESS:  Erin Hayes  is a 51 y.o. female who presents with What she describes as watery diarrhea for the past 3-4 months. He states she has a few episodes of diarrhea each day. She presented today because she has been feeling significantly weak for the last several days. Here in the ED she was found to have significantly low potassium, and some AKI. Hospitalists were called for admission and further evaluation  PAST MEDICAL HISTORY:   Past Medical History:  Diagnosis Date  . AKI (acute kidney injury) ([place]) 10/31/2016  . Diabetes ([place])   . GERD (gastroesophageal reflux disease)   . Hypertension   . Non-smoker     PAST SURGICAL HISTORY:   Past Surgical History:  Procedure Laterality Date  . LITHOTRIPSY      SOCIAL HISTORY:   Social History  Substance Use Topics  . Smoking status: Never Smoker  . Smokeless tobacco: Never Used  . Alcohol use No    FAMILY HISTORY:   Family History  Problem Relation Age of Onset  . Hypertension Father     DRUG ALLERGIES:  No Known Allergies  MEDICATIONS AT HOME:   Prior to Admission medications   Medication Sig Start Date End Date Taking? Authorizing Provider  amLODipine (NORVASC) 5 MG tablet Take 1 tablet (5 mg total) by mouth daily. 10/18/15   Boyce Medici, FNP  aspirin 81 MG tablet Take 81 mg by mouth daily.    [provider]  glipiZIDE (GLUCOTROL) 5 MG tablet Take 1 tablet (5 mg total) by mouth 2 (two) times daily. 10/14/15 10/02/16  Zara Council A, PA-C  hydrochlorothiazide (HYDRODIURIL) 25 MG tablet Take 1 tablet (25 mg total) by mouth daily.  10/14/15   Zara Council A, PA-C  insulin glargine (LANTUS) 100 UNIT/ML injection Inject 0.25 mLs (25 Units total) into the skin at bedtime. 11/22/15   Bertram Gala, PA  lisinopril (PRINIVIL,ZESTRIL) 20 MG tablet Take 2 tablets (40 mg total) by mouth at bedtime. 11/22/15   Bertram Gala, PA  metFORMIN (GLUCOPHAGE) 500 MG tablet Take 1 tablet (500 mg total) by mouth 2 (two) times daily with a meal. 10/14/15   McGowan, Larene Beach A, PA-C    REVIEW OF SYSTEMS:  Review of Systems  Constitutional: Negative for chills, fever, malaise/fatigue and weight loss.  HENT: Negative for ear pain, hearing loss and tinnitus.   Eyes: Negative for blurred vision, double vision, pain and redness.  Respiratory: Negative for cough, hemoptysis and shortness of breath.   Cardiovascular: Negative for chest pain, palpitations, orthopnea and leg swelling.  Gastrointestinal: Positive for abdominal pain (Sometimes) and diarrhea. Negative for constipation, nausea and vomiting.  Genitourinary: Negative for dysuria, frequency and hematuria.  Musculoskeletal: Negative for back pain, joint pain and neck pain.  Skin:       No acne, rash, or lesions  Neurological: Positive for weakness. Negative for dizziness, tremors and focal weakness.  Endo/Heme/Allergies: Negative for polydipsia. Does not bruise/bleed easily.  Psychiatric/Behavioral: Negative for depression. The patient is not nervous/anxious and does not have insomnia.      VITAL SIGNS:  Vitals:   10/31/16 1945  BP: 107/87  Pulse: (!) 104  Resp: 15  Temp: 98 F (36.7 C)  TempSrc: Oral  SpO2: 100%  Weight: 29.3 kg (64 lb 9.6 oz)  Height: 4\' 11"  (1.499 m)   Wt Readings from Last 3 Encounters:  10/31/16 29.3 kg (64 lb 9.6 oz)  10/02/16 43.1 kg (95 lb)  09/01/16 43.1 kg (95 lb)    PHYSICAL EXAMINATION:  Physical Exam  Vitals reviewed. Constitutional: She is oriented to person, place, and time. She appears well-developed and well-nourished. No distress.   HENT:  Head: Normocephalic and atraumatic.  Dry mucous membranes  Eyes: Conjunctivae and EOM are normal. Pupils are equal, round, and reactive to light. No scleral icterus.  Neck: Normal range of motion. Neck supple. No JVD present. No thyromegaly present.  Cardiovascular: Normal rate, regular rhythm and intact distal pulses.  Exam reveals no gallop and no friction rub.   No murmur heard. Respiratory: Effort normal and breath sounds normal. No respiratory distress. She has no wheezes. She has no rales.  GI: Soft. Bowel sounds are normal. She exhibits no distension. There is no tenderness.  Musculoskeletal: Normal range of motion. She exhibits no edema.  No arthritis, no gout  Lymphadenopathy:    She has no cervical adenopathy.  Neurological: She is alert and oriented to person, place, and time. No cranial nerve deficit.  No dysarthria, no aphasia  Skin: Skin is warm and dry. No rash noted. No erythema.  Psychiatric: She has a normal mood and affect. Her behavior is normal. Judgment and thought content normal.    LABORATORY PANEL:   CBC  Recent Labs Lab 10/31/16 1949  WBC 13.0*  HGB 15.6  HCT 46.1  PLT 366   ------------------------------------------------------------------------------------------------------------------  Chemistries   Recent Labs Lab 10/31/16 1949  NA 137  K <2.0*  CL 104  CO2 15*  GLUCOSE 144*  BUN 41*  CREATININE 2.16*  CALCIUM 9.0  MG 2.4  AST 57*  ALT 35  ALKPHOS 86  BILITOT 1.7*   ------------------------------------------------------------------------------------------------------------------  Cardiac Enzymes  Recent Labs Lab 10/31/16 1949  TROPONINI 0.05*   ------------------------------------------------------------------------------------------------------------------  RADIOLOGY:  No results found.  EKG:   Orders placed or performed during the hospital encounter of 10/31/16  . EKG 12-Lead  . EKG 12-Lead  . ED EKG  .  ED EKG    IMPRESSION AND PLAN:  Principal Problem:   AKI (acute kidney injury) ([place]) - likely due to her diarrhea and dehydration. IV fluids, avoid nephrotoxins, monitor for improvement Active Problems:   Hypokalemia - also likely as a result of her diarrhea, replace, monitor   Diarrhea - unclear etiology, patient denies taking any antibiotics in the recent past. We'll get a GI consult for further recommendation   UTI (urinary tract infection) - IV Rocephin, culture sent   Diabetes ([place]) - sliding scale insulin with corresponding glucose checks   Hypertension - continue home meds  All the records are reviewed and case discussed with ED provider. Management plans discussed with the patient and/or family.  DVT PROPHYLAXIS: SubQ heparin  GI PROPHYLAXIS: None  ADMISSION STATUS: Inpatient  CODE STATUS: Full Code Status History    Date Active Date Inactive Code Status Order ID Comments User Context   04/26/2013  1:40 AM 04/29/2013  9:57 PM Full Code [id number]  Guy Begin, MD Inpatient      TOTAL TIME TAKING CARE OF THIS PATIENT: 45 minutes.   Merced Brougham FIELDING 10/31/2016, 9:17 PM  [facility]  Office  [phone]  CC: Primary care physician; Patient, No Pcp Per  Note:  This document was prepared using Dragon voice recognition software and may include unintentional dictation errors.

## 2016-10-31 NOTE — ED Triage Notes (Signed)
Pt arrived via ems from home. Pt reports waking up this morning and feeling extremely weak. Pt's family encouraged her to be seen this evening. Pt also reports having recurrent diarrhea for the last month. Pt alert and oriented x 4.

## 2016-10-31 NOTE — Progress Notes (Signed)
Pharmacy Antibiotic Note  Erin Hayes is a 51 y.o. female admitted on 10/31/2016 with UTI.  Pharmacy has been consulted for ceftriaxone dosing.  Plan: Ceftriaxone 2 grams q 24 hours ordered/  Height: 4\' 11"  (149.9 cm) Weight: 64 lb 9.6 oz (29.3 kg) IBW/kg (Calculated) : 43.2  Temp (24hrs), Avg:98 F (36.7 C), Min:98 F (36.7 C), Max:98 F (36.7 C)   Recent Labs Lab 10/31/16 1949  WBC 13.0*  CREATININE 2.16*    Estimated Creatinine Clearance: 14.1 mL/min (A) (by C-G formula based on SCr of 2.16 mg/dL (H)).    No Known Allergies  Antimicrobials this admission: Ceftriaxone 6/27  >>    >>   Dose adjustments this admission:   Microbiology results: 6/27 UCx: pending       6/27 UA: LE(+) NO2(-)  WBC 6-30 Thank you for allowing pharmacy to be a part of this patient's care.  Renetta Suman S 10/31/2016 10:09 PM

## 2016-10-31 NOTE — ED Notes (Signed)
Pharmacy called for IV potassium

## 2016-10-31 NOTE — ED Provider Notes (Signed)
[facility] Emergency Department Provider Note  ____________________________________________   First MD Initiated Contact with Patient 10/31/16 1944     (approximate)  I have reviewed the triage vital signs and the nursing notes.   HISTORY  Chief Complaint Diarrhea and Weakness  The patient is an extremely vague historian which limits the ability to obtain an accurate history of present illness  HPI Erin Hayes is a 51 y.o. female who presents by EMS for evaluation of generalized weakness starting this morning.  She describes it as severe.  Nothing in particular makes the patient's symptoms better nor worse.  She states that she has been having diarrhea for the last month, but reviewing the medical record shows that it has likely been going on for several months and she has been seen in the ED previously for diarrhea.  She denies fever/chills, chest pain, and shortness of breath.  She states that her abdomen does hurt and ache from time to time but it is unclear from her history how long this has been going on.  She denies nausea or vomiting.  She states that sometimes she feels pressure when she has a bowel movement and sometimes she feels some pain when she urinates.  She thinks she may have been urinating more than usual recently.  She states that she has been compliant with all her usual medications.    Past Medical History:  Diagnosis Date  . Diabetes ([place])   . GERD (gastroesophageal reflux disease)   . Hypertension   . Non-smoker     Patient Active Problem List   Diagnosis Date Noted  . Hypokalemia 04/28/2013  . Leukocytosis, unspecified 04/28/2013  . Encephalopathy 04/28/2013  . DKA (diabetic ketoacidoses) ([place]) 04/26/2013  . Diabetes ([place])   . Hypertension     Past Surgical History:  Procedure Laterality Date  . LITHOTRIPSY      Prior to Admission medications   Medication Sig Start Date End Date Taking? Authorizing Provider    amLODipine (NORVASC) 5 MG tablet Take 1 tablet (5 mg total) by mouth daily. 10/18/15   Boyce Medici, FNP  aspirin 81 MG tablet Take 81 mg by mouth daily.    [provider]  glipiZIDE (GLUCOTROL) 5 MG tablet Take 1 tablet (5 mg total) by mouth 2 (two) times daily. 10/14/15 10/02/16  Zara Council A, PA-C  hydrochlorothiazide (HYDRODIURIL) 25 MG tablet Take 1 tablet (25 mg total) by mouth daily. 10/14/15   Zara Council A, PA-C  insulin glargine (LANTUS) 100 UNIT/ML injection Inject 0.25 mLs (25 Units total) into the skin at bedtime. 11/22/15   Bertram Gala, PA  lisinopril (PRINIVIL,ZESTRIL) 20 MG tablet Take 2 tablets (40 mg total) by mouth at bedtime. 11/22/15   Bertram Gala, PA  metFORMIN (GLUCOPHAGE) 500 MG tablet Take 1 tablet (500 mg total) by mouth 2 (two) times daily with a meal. 10/14/15   McGowan, Larene Beach A, PA-C    Allergies Patient has no known allergies.  Family History  Problem Relation Age of Onset  . Hypertension Father     Social History Social History  Substance Use Topics  . Smoking status: Never Smoker  . Smokeless tobacco: Never Used  . Alcohol use No    Review of Systems Constitutional: No fever/chills Eyes: No visual changes. ENT: No sore throat. Cardiovascular: Denies chest pain. Respiratory: Denies shortness of breath. Gastrointestinal: No abdominal pain.  No nausea, no vomiting.  No diarrhea.  No constipation. Genitourinary: Negative for  dysuria. Musculoskeletal: Negative for neck pain.  Negative for back pain. Integumentary: Negative for rash. Neurological: Negative for headaches, focal weakness or numbness.   ____________________________________________   PHYSICAL EXAM:  VITAL SIGNS: ED Triage Vitals [10/31/16 1945]  Enc Vitals Group     BP 107/87     Pulse Rate (!) 104     Resp 15     Temp 98 F (36.7 C)     Temp Source Oral     SpO2 100 %     Weight 29.3 kg (64 lb 9.6 oz)     Height 1.499 m (4\' 11" )     Head  Circumference      Peak Flow      Pain Score      Pain Loc      Pain Edu?      Excl. in [place]?     Constitutional: Alert and oriented. Tiny and Cachectic apparently at baseline Eyes: Conjunctivae are normal.  Head: Atraumatic. Nose: No congestion/rhinnorhea. Mouth/Throat: Mucous membranes are dry.  Strong halitosis.  Neck: No stridor.  No meningeal signs.   Cardiovascular: Mild tachycardia, regular rhythm. Good peripheral circulation. Grossly normal heart sounds. Respiratory: Normal respiratory effort.  No retractions. Lungs CTAB. Gastrointestinal: Soft and nontender. No distention.  Musculoskeletal: No lower extremity tenderness nor edema. No gross deformities of extremities. Neurologic:  Normal speech and language. No gross focal neurologic deficits are appreciated.  Skin:  Skin is warm, dry and intact. No rash noted.   ____________________________________________   LABS (all labs ordered are listed, but only abnormal results are displayed)  Labs Reviewed  COMPREHENSIVE METABOLIC PANEL - Abnormal; Notable for the following:       Result Value   Potassium <2.0 (*)    CO2 15 (*)    Glucose, Bld 144 (*)    BUN 41 (*)    Creatinine, Ser 2.16 (*)    AST 57 (*)    Total Bilirubin 1.7 (*)    GFR calc non Af Amer 25 (*)    GFR calc Af Amer 29 (*)    Anion gap 18 (*)    All other components within normal limits  URINALYSIS, COMPLETE (UACMP) WITH MICROSCOPIC - Abnormal; Notable for the following:    Color, Urine YELLOW (*)    APPearance HAZY (*)    Ketones, ur 5 (*)    Protein, ur 100 (*)    Leukocytes, UA LARGE (*)    Bacteria, UA RARE (*)    Squamous Epithelial / LPF 0-5 (*)    All other components within normal limits  TROPONIN I - Abnormal; Notable for the following:    Troponin I 0.05 (*)    All other components within normal limits  CBC WITH DIFFERENTIAL/PLATELET - Abnormal; Notable for the following:    WBC 13.0 (*)    RBC 5.60 (*)    Neutro Abs 9.8 (*)    All  other components within normal limits  GLUCOSE, CAPILLARY - Abnormal; Notable for the following:    Glucose-Capillary 141 (*)    All other components within normal limits  URINE CULTURE  LIPASE, BLOOD  MAGNESIUM  TROPONIN I  CBG MONITORING, ED   ____________________________________________  EKG  ED ECG REPORT I, Waunita Sandstrom, the attending physician, personally viewed and interpreted this ECG.  Date: 10/31/2016 EKG Time: 19:43 Rate: 103 Rhythm: sinus tachycardia with multiform PVCs QRS Axis: normal Intervals: QTc prolonged at 558 ms, prolonged QRS complex at 144 ms ST/T Wave abnormalities: Non-specific  ST segment / T-wave changes, slight ST elevation in aVL.  Narrative Interpretation: questionable ischemia but does not meet STEMI criteria.  Of note, LBBB was present in prior EKGs, and overall morphology is similar   ____________________________________________  RADIOLOGY   No results found.  ____________________________________________   PROCEDURES  Critical Care performed: Yes, see critical care procedure note(s)   Procedure(s) performed:   .Critical Care Performed by: Hinda Kehr Authorized by: Hinda Kehr   Critical care provider statement:    Critical care time (minutes):  30   Critical care time was exclusive of:  Separately billable procedures and treating other patients   Critical care was necessary to treat or prevent imminent or life-threatening deterioration of the following conditions:  Metabolic crisis and renal failure   Critical care was time spent personally by me on the following activities:  Development of treatment plan with patient or surrogate, discussions with consultants, evaluation of patient's response to treatment, examination of patient, obtaining history from patient or surrogate, ordering and performing treatments and interventions, ordering and review of laboratory studies, ordering and review of radiographic studies, pulse  oximetry, re-evaluation of patient's condition and review of old charts     ____________________________________________   INITIAL IMPRESSION / [place] / ED COURSE  Pertinent labs & imaging results that were available during my care of the patient were reviewed by me and considered in my medical decision making (see chart for details).  The patient has an abnormal EKG with some interval prolongations as described above and questionable ischemia although it does not meet any STEMI criteria.  She is not having chest pain but instead complains of generalized weakness.  She has a history of diabetes and DKA and we may be seeing the results of metabolic abnormalities and hyperglycemia.  I will evaluate broadly with lab work and provided an IV fluid bolus while awaiting the results of her labs and the determine what additional workup is needed at that point.   Clinical Course as of Oct 31 2108  Wed Oct 31, 2016  2024 Reassuring fingerstick blood glucose.  The remainder of her labs are pending. Glucose-Capillary: (!) 141 [CF]  2104 Labs are notable for undetectable potassium less than 2, normal magnesium, elevated troponin at 0.05 which is most likely due to her acute renal failure with creatinine of 2.16 in the setting of cachexia and almost no muscle mass.  I have ordered the fluid bolus, oral replacement potassium, and IV replacement potassium.  I updated the patient and will discuss the case with the hospitalist.  She has had chronic diarrhea for several months and likely needs a further GI workup while in the hospital as most likely accommodation of dehydration and GI output is causing the joint abnormalities.  [CF]  2110 Also ordered ceftriaxone and urine culture for probable urinary tract infection in the setting of reported dysuria  [CF]  2110 Discussed with Dr. Jannifer Franklin who will admit  [CF]    Clinical Course User Index [CF] Hinda Kehr, MD     ____________________________________________  FINAL CLINICAL IMPRESSION(S) / ED DIAGNOSES  Final diagnoses:  Acute renal failure, unspecified acute renal failure type ([place])  Hypokalemia  Generalized weakness  Chronic diarrhea  Elevated troponin I level  Urinary tract infection without hematuria, site unspecified     MEDICATIONS GIVEN DURING THIS VISIT:  Medications  potassium chloride 10 mEq in 100 mL IVPB (not administered)  cefTRIAXone (ROCEPHIN) 1 g in dextrose 5 % 50 mL IVPB (not administered)  potassium chloride (KLOR-CON) packet 40 mEq (40 mEq Oral Given 10/31/16 2105)  sodium chloride 0.9 % bolus 1,000 mL (1,000 mLs Intravenous New Bag/Given 10/31/16 2108)     NEW OUTPATIENT MEDICATIONS STARTED DURING THIS VISIT:  New Prescriptions   No medications on file    Modified Medications   No medications on file    Discontinued Medications   No medications on file     Note:  This document was prepared using Dragon voice recognition software and may include unintentional dictation errors.    Hinda Kehr, MD 10/31/16 2110

## 2016-11-01 LAB — CBC
HCT: 41.6 % (ref 35.0–47.0)
Hemoglobin: 14.3 g/dL (ref 12.0–16.0)
MCH: 28.2 pg (ref 26.0–34.0)
MCHC: 34.3 g/dL (ref 32.0–36.0)
MCV: 82.1 fL (ref 80.0–100.0)
PLATELETS: 354 10*3/uL (ref 150–440)
RBC: 5.06 MIL/uL (ref 3.80–5.20)
RDW: 14 % (ref 11.5–14.5)
WBC: 13.7 10*3/uL — AB (ref 3.6–11.0)

## 2016-11-01 LAB — GASTROINTESTINAL PANEL BY PCR, STOOL (REPLACES STOOL CULTURE)

## 2016-11-01 LAB — BASIC METABOLIC PANEL
Anion gap: 16 — ABNORMAL HIGH (ref 5–15)
BUN: 41 mg/dL — AB (ref 6–20)
CO2: 18 mmol/L — ABNORMAL LOW (ref 22–32)
CREATININE: 2.11 mg/dL — AB (ref 0.44–1.00)
Calcium: 8.2 mg/dL — ABNORMAL LOW (ref 8.9–10.3)
Chloride: 102 mmol/L (ref 101–111)
GFR calc Af Amer: 30 mL/min — ABNORMAL LOW (ref >60)
GFR, EST NON AFRICAN AMERICAN: 26 mL/min — AB (ref >60)
Glucose, Bld: 137 mg/dL — ABNORMAL HIGH (ref 65–99)
POTASSIUM: 2.6 mmol/L — AB (ref 3.5–5.1)
SODIUM: 136 mmol/L (ref 135–145)

## 2016-11-01 LAB — TROPONIN I
Troponin I: 0.07 ng/mL (ref <0.03)
Troponin I: 0.04 ng/mL (ref <0.03)
Troponin I: 0.05 ng/mL (ref <0.03)

## 2016-11-01 LAB — GLUCOSE, CAPILLARY
GLUCOSE-CAPILLARY: 118 mg/dL — AB (ref 65–99)
GLUCOSE-CAPILLARY: 128 mg/dL — AB (ref 65–99)
Glucose-Capillary: 131 mg/dL — ABNORMAL HIGH (ref 65–99)
Glucose-Capillary: 132 mg/dL — ABNORMAL HIGH (ref 65–99)
Glucose-Capillary: 137 mg/dL — ABNORMAL HIGH (ref 65–99)

## 2016-11-01 LAB — C DIFFICILE QUICK SCREEN W PCR REFLEX
C DIFFICILE (CDIFF) TOXIN: NEGATIVE
C DIFFICLE (CDIFF) ANTIGEN: NEGATIVE
C Diff interpretation: NOT DETECTED

## 2016-11-01 LAB — MAGNESIUM: Magnesium: 2.2 mg/dL (ref 1.7–2.4)

## 2016-11-01 MED ORDER — POTASSIUM CHLORIDE CRYS ER 20 MEQ PO TBCR
40.0000 meq | EXTENDED_RELEASE_TABLET | Freq: Once | ORAL | Status: AC
  Administered 2016-11-01: 40 meq via ORAL
  Filled 2016-11-01: qty 2

## 2016-11-01 MED ORDER — ENSURE ENLIVE PO LIQD
237.0000 mL | Freq: Three times a day (TID) | ORAL | Status: DC
  Administered 2016-11-01 – 2016-11-06 (×5): 237 mL via ORAL

## 2016-11-01 MED ORDER — ADULT MULTIVITAMIN W/MINERALS CH
1.0000 | ORAL_TABLET | Freq: Every day | ORAL | Status: DC
  Administered 2016-11-03 – 2016-11-06 (×3): 1 via ORAL
  Filled 2016-11-01 (×6): qty 1

## 2016-11-01 MED ORDER — POTASSIUM CHLORIDE CRYS ER 20 MEQ PO TBCR
40.0000 meq | EXTENDED_RELEASE_TABLET | ORAL | Status: AC
  Administered 2016-11-01 (×2): 40 meq via ORAL
  Filled 2016-11-01 (×2): qty 2

## 2016-11-01 NOTE — Progress Notes (Signed)
Lab notified primary nurse of Pt critical labs. Potassium 2.6; Troponin 0.07. Primary nurse notified Dr. Marcille Blanco. Dr. Marcille Blanco to place orders to replace potassium. Primary nurse to continue to monitor.

## 2016-11-01 NOTE — Progress Notes (Signed)
Initial Nutrition Assessment  DOCUMENTATION CODES:   Severe malnutrition in context of chronic illness  INTERVENTION:   Ensure Enlive po BID, each supplement provides 350 kcal and 20 grams of protein  MVI  NUTRITION DIAGNOSIS:   Malnutrition (severe) related to chronic illness (diarrhea x 3-4 months, DM) as evidenced by 29 percent weight loss in one year, severe depletion of muscle mass, severe depletion of body fat.  GOAL:   Patient will meet greater than or equal to 90% of their needs  MONITOR:   PO intake, Supplement acceptance, Labs, Weight trends  REASON FOR ASSESSMENT:   Malnutrition Screening Tool    ASSESSMENT:   51 y.o. female who presents with watery diarrhea for the past 3-4 months. She states she has a few episodes of diarrhea each day. Admitted fir significantly low potassium, UTI, and AKI.   Met with pt in room today. Pt reports poor appetite and oral intake for 2 days pta but up until then, pt had been eating well. Pt is currently eating 75% of meals in hospital. Pt does drink some Ensure at home. Pt reports continued unintentional wt loss. Per chart, pt has lost 30lbs(29%) in 1 year; this is significant. Per pt, the majority of weight loss has been recent. Pt with 10lb weight gain since admit; likely from rehydration. (admit wt was 64lbs). Pt reports chronic diarrhea that has been going on for months; GI consult pending. Pt with hypokalemia on admit; monitor and supplement as needed per MD discretion.    Medications reviewed and include: aspirin, heparin, insulin, KCl  Labs reviewed: K 2.6(L), BUN 41(H), creat 2.11(H), Ca 8.2(L) Mg 2.4 wnl- 6/27 Wbc- 13.7(H) cbgs- 144, 137 x 24 hrs  Nutrition-Focused physical exam completed. Findings are severe fat depletion in arms and chest, severe muscle depletion over entire body, and no edema.   Diet Order:  Diet heart healthy/carb modified Room service appropriate? Yes; Fluid consistency: Thin  Skin:  Reviewed, no  issues  Last BM:  6/27  Height:   Ht Readings from Last 1 Encounters:  10/31/16 _0  (1.499 m)    Weight:   Wt Readings from Last 1 Encounters:  11/01/16 73 lb 6.4 oz (33.3 kg)    Ideal Body Weight:  44.5 kg  BMI:  Body mass index is 14.83 kg/m.  Estimated Nutritional Needs:   Kcal:  1200-1300kcal/day   Protein:  67-73g/day  Fluid:  >1.2L/day   EDUCATION NEEDS:   Education needs addressed  Koleen Distance MS, RD, LDN Pager #[phone] After Hours Pager: [phone]

## 2016-11-01 NOTE — Progress Notes (Signed)
   [place] at [place] NAME: Suzzane Quilter    MR#:  [id number]  DATE OF BIRTH:  14-Aug-1965  SUBJECTIVE:  CHIEF COMPLAINT:   Chief Complaint  Patient presents with  . Diarrhea  . Weakness  feels some better, still having diarrhea REVIEW OF SYSTEMS:  Review of Systems  Constitutional: Positive for malaise/fatigue. Negative for chills, fever and weight loss.  HENT: Negative for nosebleeds and sore throat.   Eyes: Negative for blurred vision.  Respiratory: Negative for cough, shortness of breath and wheezing.   Cardiovascular: Negative for chest pain, orthopnea, leg swelling and PND.  Gastrointestinal: Positive for diarrhea. Negative for abdominal pain, constipation, heartburn, nausea and vomiting.  Genitourinary: Negative for dysuria and urgency.  Musculoskeletal: Negative for back pain.  Skin: Negative for rash.  Neurological: Positive for weakness. Negative for dizziness, speech change, focal weakness and headaches.  Endo/Heme/Allergies: Does not bruise/bleed easily.  Psychiatric/Behavioral: Negative for depression.    DRUG ALLERGIES:  No Known Allergies VITALS:  Blood pressure 97/75, pulse 87, temperature 98.3 F (36.8 C), temperature source Oral, resp. rate 20, height 4\' 11"  (1.499 m), weight 33.3 kg (73 lb 6.4 oz), SpO2 100 %. PHYSICAL EXAMINATION:  Physical Exam LABORATORY PANEL:  Female CBC  Recent Labs Lab 11/01/16 0219  WBC 13.7*  HGB 14.3  HCT 41.6  PLT 354   ------------------------------------------------------------------------------------------------------------------ Chemistries   Recent Labs Lab 10/31/16 1949 11/01/16 0219  NA 137 136  K <2.0* 2.6*  CL 104 102  CO2 15* 18*  GLUCOSE 144* 137*  BUN 41* 41*  CREATININE 2.16* 2.11*  CALCIUM 9.0 8.2*  MG 2.4  --   AST 57*  --   ALT 35  --   ALKPHOS 86  --   BILITOT 1.7*  --    RADIOLOGY:  No results found. ASSESSMENT AND PLAN:   * AKI (acute  kidney injury) ([place]) - likely due to her diarrhea and dehydration. IV fluids, avoid nephrotoxins, monitor for improvement  * Hypokalemia - also likely as a result of her diarrhea, replace and recheck - check mg  * chronic Diarrhea - unclear etiology, patient denies taking any antibiotics in the recent past.  - await GI consult for further recommendation - check stool studies and c.diff  * UTI (urinary tract infection) - IV Rocephin, pending urine culture   * Diabetes ([place]) - sliding scale insulin with corresponding glucose checks    Hypertension - continue home meds     All the records are reviewed and case discussed with Care Management/Social Worker. Management plans discussed with the patient, nursing and they are in agreement.  CODE STATUS: Full Code  TOTAL TIME TAKING CARE OF THIS PATIENT: 35 minutes.   More than 50% of the time was spent in counseling/coordination of care: YES  POSSIBLE D/C IN 1-2 DAYS, DEPENDING ON CLINICAL CONDITION.   Max Sane M.D on 11/01/2016 at 12:37 PM  Between 7am to 6pm - Pager - [phone]  After 6pm go to [email or web address] - Proofreader  [facility]  Office  [phone]  CC: Primary care physician; Patient, No Pcp Per  Note: This dictation was prepared with Dragon dictation along with smaller phrase technology. Any transcriptional errors that result from this process are unintentional.

## 2016-11-02 DIAGNOSIS — E43 Unspecified severe protein-calorie malnutrition: Secondary | ICD-10-CM | POA: Insufficient documentation

## 2016-11-02 LAB — CBC
HCT: 38.2 % (ref 35.0–47.0)
Hemoglobin: 13 g/dL (ref 12.0–16.0)
MCH: 28.1 pg (ref 26.0–34.0)
MCHC: 34 g/dL (ref 32.0–36.0)
MCV: 82.7 fL (ref 80.0–100.0)
Platelets: 323 10*3/uL (ref 150–440)
RBC: 4.63 MIL/uL (ref 3.80–5.20)
RDW: 14.1 % (ref 11.5–14.5)
WBC: 8.7 10*3/uL (ref 3.6–11.0)

## 2016-11-02 LAB — BASIC METABOLIC PANEL
ANION GAP: 7 (ref 5–15)
BUN: 32 mg/dL — ABNORMAL HIGH (ref 6–20)
CALCIUM: 8.1 mg/dL — AB (ref 8.9–10.3)
CO2: 19 mmol/L — ABNORMAL LOW (ref 22–32)
Chloride: 110 mmol/L (ref 101–111)
Creatinine, Ser: 1.4 mg/dL — ABNORMAL HIGH (ref 0.44–1.00)
GFR, EST AFRICAN AMERICAN: 49 mL/min — AB (ref >60)
GFR, EST NON AFRICAN AMERICAN: 42 mL/min — AB (ref >60)
Glucose, Bld: 112 mg/dL — ABNORMAL HIGH (ref 65–99)
Potassium: <2 mmol/L — CL (ref 3.5–5.1)
SODIUM: 136 mmol/L (ref 135–145)

## 2016-11-02 LAB — POTASSIUM
POTASSIUM: 2.7 mmol/L — AB (ref 3.5–5.1)
Potassium: 2.7 mmol/L — CL (ref 3.5–5.1)

## 2016-11-02 LAB — GLUCOSE, CAPILLARY
Glucose-Capillary: 127 mg/dL — ABNORMAL HIGH (ref 65–99)
Glucose-Capillary: 126 mg/dL — ABNORMAL HIGH (ref 65–99)
Glucose-Capillary: 148 mg/dL — ABNORMAL HIGH (ref 65–99)
Glucose-Capillary: 235 mg/dL — ABNORMAL HIGH (ref 65–99)

## 2016-11-02 LAB — PHOSPHORUS: Phosphorus: 2.4 mg/dL — ABNORMAL LOW (ref 2.5–4.6)

## 2016-11-02 LAB — MAGNESIUM: MAGNESIUM: 2 mg/dL (ref 1.7–2.4)

## 2016-11-02 LAB — HIV ANTIBODY (ROUTINE TESTING W REFLEX): HIV SCREEN 4TH GENERATION: NONREACTIVE

## 2016-11-02 MED ORDER — POTASSIUM CHLORIDE 10 MEQ/100ML IV SOLN
INTRAVENOUS | Status: AC
  Administered 2016-11-02: 10 meq
  Filled 2016-11-02: qty 100

## 2016-11-02 MED ORDER — ZINC OXIDE 11.3 % EX CREA
TOPICAL_CREAM | Freq: Two times a day (BID) | CUTANEOUS | Status: DC
  Administered 2016-11-03 – 2016-11-07 (×8): via TOPICAL
  Filled 2016-11-02 (×2): qty 56

## 2016-11-02 MED ORDER — POTASSIUM CHLORIDE 10 MEQ/100ML IV SOLN
10.0000 meq | INTRAVENOUS | Status: DC
  Administered 2016-11-02: 10 meq via INTRAVENOUS
  Filled 2016-11-02: qty 100

## 2016-11-02 MED ORDER — POTASSIUM CHLORIDE 10 MEQ/100ML IV SOLN
10.0000 meq | INTRAVENOUS | Status: AC
  Administered 2016-11-02 (×4): 10 meq via INTRAVENOUS
  Filled 2016-11-02 (×3): qty 100

## 2016-11-02 MED ORDER — POTASSIUM PHOSPHATES 15 MMOLE/5ML IV SOLN
30.0000 mmol | Freq: Once | INTRAVENOUS | Status: DC
  Filled 2016-11-02: qty 10

## 2016-11-02 MED ORDER — POTASSIUM CHLORIDE CRYS ER 20 MEQ PO TBCR
40.0000 meq | EXTENDED_RELEASE_TABLET | Freq: Once | ORAL | Status: AC
  Administered 2016-11-02: 40 meq via ORAL
  Filled 2016-11-02: qty 2

## 2016-11-02 MED ORDER — POTASSIUM CHLORIDE CRYS ER 20 MEQ PO TBCR
40.0000 meq | EXTENDED_RELEASE_TABLET | ORAL | Status: AC
  Administered 2016-11-02 (×2): 40 meq via ORAL
  Filled 2016-11-02 (×2): qty 2

## 2016-11-02 MED ORDER — DEXTROSE 5 % IV SOLN
1.0000 g | INTRAVENOUS | Status: DC
  Administered 2016-11-02 – 2016-11-03 (×2): 1 g via INTRAVENOUS
  Filled 2016-11-02 (×3): qty 10

## 2016-11-02 MED ORDER — POTASSIUM CHLORIDE CRYS ER 20 MEQ PO TBCR
40.0000 meq | EXTENDED_RELEASE_TABLET | ORAL | Status: AC | PRN
  Administered 2016-11-02 – 2016-11-03 (×2): 40 meq via ORAL
  Filled 2016-11-02 (×2): qty 2

## 2016-11-02 NOTE — Progress Notes (Addendum)
Prime doc notified of pt's inability to tol potassium boluses, waiting call back. Orders given.

## 2016-11-02 NOTE — Progress Notes (Signed)
Pt expressed to RN with tears in her eyes saying that she does not want and can not tolerate the IV fluids that are potassium phosphate with 5% dextrose at this time. Prime doctor was notified that pt has orders due but can not tolerate potassium at this time, new orders to hold potassium fluids at this time and if potassium lab draw at 2100 11/02/2016 is >3 then okay to d/c order. Will continue to monitor pt.   Tyron Manetta [employer]

## 2016-11-02 NOTE — Progress Notes (Signed)
Primary Nurse notified Dr. Marcille Blanco of pt critical potassium result of less that 2. Dr. Marcille Blanco to place orders. Primary nurse to continue to monitor.

## 2016-11-02 NOTE — Progress Notes (Signed)
[place] for electrolyte management  Indication: severe hypokalemia   Pharmacy consulted for electrolyte management for 51 yo female admitted with diarrhea being treated for hypokalemia. Patient has received potassium 29mEq PO x 3, potassium 59mEq IV Q1 hr x 4 doses, and potassium phosphate 41mmol IV x 1 for a total 204 mEq of potassium on 6/29.   Plan:  Patient's potassium remained the same 2.7, despite receiving potassium phosphate 5mmol (44 mEq of potassium) x 1. Will order additional potassium 9mEq IV x 4 and recheck potassium at 0500.   No Known Allergies  Patient Measurements: Height: 4\' 11"  (149.9 cm) Weight: 73 lb 11.2 oz (33.4 kg) IBW/kg (Calculated) : 43.2   Vital Signs: Temp: 98.3 F (36.8 C) (06/29 2026) Temp Source: Oral (06/29 2026) BP: 125/95 (06/29 2026) Pulse Rate: 82 (06/29 2026) Intake/Output from previous day: 06/28 0701 - 06/29 0700 In: 0  Out: 50 [Urine:50] Intake/Output from this shift: Total I/O In: 118 [P.O.:118] Out: -   Labs:  Recent Labs  10/31/16 1949 11/01/16 0219 11/01/16 1410 11/02/16 0416  WBC 13.0* 13.7*  --  8.7  HGB 15.6 14.3  --  13.0  HCT 46.1 41.6  --  38.2  PLT 366 354  --  323  CREATININE 2.16* 2.11*  --  1.40*  MG 2.4  --  2.2 2.0  PHOS  --   --   --  2.4*  ALBUMIN 4.1  --   --   --   PROT 7.4  --   --   --   AST 57*  --   --   --   ALT 35  --   --   --   ALKPHOS 86  --   --   --   BILITOT 1.7*  --   --   --    Estimated Creatinine Clearance: 24.8 mL/min (A) (by C-G formula based on SCr of 1.4 mg/dL (H)).    Medical History: Past Medical History:  Diagnosis Date  . AKI (acute kidney injury) ([place]) 10/31/2016  . Diabetes ([place])   . GERD (gastroesophageal reflux disease)   . Hypertension   . Non-smoker     Pharmacy will continue to monitor and adjust per consult.   Chaze Hruska L 11/02/2016,10:07 PM

## 2016-11-02 NOTE — Progress Notes (Signed)
Prime doctor notified of critical lab, K- 2.7. New orders to place lab draw at 1800 10/02/2016. Will continue to monitor pt.   Falecia Vannatter [employer]

## 2016-11-02 NOTE — Progress Notes (Signed)
Lab notified primary nurse of critical lab. K less than 2.0. Primary Dr paged. Awaiting on callback.

## 2016-11-02 NOTE — Progress Notes (Signed)
Dr Jannifer Franklin notified potassium 2.7, orders given.

## 2016-11-02 NOTE — Progress Notes (Signed)
[place] at [place] NAME: Erin Hayes    MR#:  [id number]  DATE OF BIRTH:  [birth year]  SUBJECTIVE:  CHIEF COMPLAINT:   Chief Complaint  Patient presents with  . Diarrhea  . Weakness  feels some better, still having diarrhea.  No GI coverage since yesterday, was awaiting for consultation.. Potassium 2.0. REVIEW OF SYSTEMS:  Review of Systems  Constitutional: Positive for malaise/fatigue. Negative for chills, fever and weight loss.  HENT: Negative for nosebleeds and sore throat.   Eyes: Negative for blurred vision.  Respiratory: Negative for cough, shortness of breath and wheezing.   Cardiovascular: Negative for chest pain, orthopnea, leg swelling and PND.  Gastrointestinal: Positive for diarrhea. Negative for abdominal pain, constipation, heartburn, nausea and vomiting.  Genitourinary: Negative for dysuria and urgency.  Musculoskeletal: Negative for back pain.  Skin: Negative for rash.  Neurological: Positive for weakness. Negative for dizziness, speech change, focal weakness and headaches.  Endo/Heme/Allergies: Does not bruise/bleed easily.  Psychiatric/Behavioral: Negative for depression.   DRUG ALLERGIES:  No Known Allergies VITALS:  Blood pressure 94/72, pulse 75, temperature 97.9 F (36.6 C), temperature source Oral, resp. rate (!) 22, height 4\' 11"  (1.499 m), weight 33.4 kg (73 lb 11.2 oz), SpO2 100 %. PHYSICAL EXAMINATION:  Physical Exam LABORATORY PANEL:  Female CBC  Recent Labs Lab 11/02/16 0416  WBC 8.7  HGB 13.0  HCT 38.2  PLT 323   ------------------------------------------------------------------------------------------------------------------ Chemistries   Recent Labs Lab 10/31/16 1949  11/01/16 1410 11/02/16 0416  NA 137  < >  --  136  K <2.0*  < >  --  <2.0*  CL 104  < >  --  110  CO2 15*  < >  --  19*  GLUCOSE 144*  < >  --  112*  BUN 41*  < >  --  32*  CREATININE 2.16*  < >  --  1.40*  CALCIUM  9.0  < >  --  8.1*  MG 2.4  --  2.2  --   AST 57*  --   --   --   ALT 35  --   --   --   ALKPHOS 86  --   --   --   BILITOT 1.7*  --   --   --   < > = values in this interval not displayed. RADIOLOGY:  No results found. ASSESSMENT AND PLAN:   * AKI (acute kidney injury) ([place]) - likely due to her diarrhea and dehydration. IV fluids, avoid nephrotoxins, monitor for improvement - creat 2.16 -> 1.4 hydration  * Severe Hypokalemia - also likely as a result of her diarrhea, replace and recheck - check mg - Pharmacy managing this  * chronic Diarrhea - unclear etiology, patient denies taking any antibiotics in the recent past.  - await GI consult for further recommendation.  Please note, a consult was placed yesterday.  There has been no GI coverage until 7 PM tonight - Negative stool studies and c.diff  *  gram-negative rods UTI (urinary tract infection) - IV Rocephin, urine culture growing gram-negative rods  * Diabetes ([facility]) - sliding scale insulin with corresponding glucose checks    Hypertension - continue home meds     All the records are reviewed and case discussed with Care Management/Social Worker. Management plans discussed with the patient, nursing and they are in agreement.  CODE STATUS: Full Code  TOTAL TIME TAKING CARE OF THIS PATIENT:  35 minutes.   More than 50% of the time was spent in counseling/coordination of care: YES  POSSIBLE D/C IN 1-2 DAYS, DEPENDING ON CLINICAL CONDITION.  And GI eval   Max Sane M.D on 11/02/2016 at 10:43 AM  Between 7am to 6pm - Pager - [phone]  After 6pm go to [email or web address] - Proofreader  [facility]  Office  [phone]  CC: Primary care physician; Patient, No Pcp Per  Note: This dictation was prepared with Dragon dictation along with smaller phrase technology. Any transcriptional errors that result from this process are unintentional.

## 2016-11-03 LAB — URINE CULTURE: Special Requests: NORMAL

## 2016-11-03 LAB — PHOSPHORUS
Phosphorus: 1.5 mg/dL — ABNORMAL LOW (ref 2.5–4.6)
Phosphorus: 1.4 mg/dL — ABNORMAL LOW (ref 2.5–4.6)

## 2016-11-03 LAB — GLUCOSE, CAPILLARY
GLUCOSE-CAPILLARY: 123 mg/dL — AB (ref 65–99)
Glucose-Capillary: 106 mg/dL — ABNORMAL HIGH (ref 65–99)
Glucose-Capillary: 103 mg/dL — ABNORMAL HIGH (ref 65–99)
Glucose-Capillary: 145 mg/dL — ABNORMAL HIGH (ref 65–99)

## 2016-11-03 LAB — MAGNESIUM
MAGNESIUM: 2.1 mg/dL (ref 1.7–2.4)
Magnesium: 2 mg/dL (ref 1.7–2.4)

## 2016-11-03 LAB — CBC
HEMATOCRIT: 39.3 % (ref 35.0–47.0)
HEMOGLOBIN: 13.4 g/dL (ref 12.0–16.0)
MCH: 27.9 pg (ref 26.0–34.0)
MCHC: 34 g/dL (ref 32.0–36.0)
MCV: 82.1 fL (ref 80.0–100.0)
Platelets: 300 10*3/uL (ref 150–440)
RBC: 4.79 MIL/uL (ref 3.80–5.20)
RDW: 14.4 % (ref 11.5–14.5)
WBC: 9.4 10*3/uL (ref 3.6–11.0)

## 2016-11-03 LAB — BASIC METABOLIC PANEL
Anion gap: 6 (ref 5–15)
BUN: 24 mg/dL — AB (ref 6–20)
CHLORIDE: 112 mmol/L — AB (ref 101–111)
CO2: 17 mmol/L — AB (ref 22–32)
CREATININE: 1.16 mg/dL — AB (ref 0.44–1.00)
Calcium: 8 mg/dL — ABNORMAL LOW (ref 8.9–10.3)
GFR calc Af Amer: >60 mL/min (ref >60)
GFR calc non Af Amer: 53 mL/min — ABNORMAL LOW (ref >60)
GLUCOSE: 117 mg/dL — AB (ref 65–99)
Potassium: 2.7 mmol/L — CL (ref 3.5–5.1)
SODIUM: 135 mmol/L (ref 135–145)

## 2016-11-03 LAB — POTASSIUM: POTASSIUM: 2.8 mmol/L — AB (ref 3.5–5.1)

## 2016-11-03 MED ORDER — LOPERAMIDE HCL 2 MG PO CAPS
4.0000 mg | ORAL_CAPSULE | Freq: Four times a day (QID) | ORAL | Status: DC
  Administered 2016-11-03 – 2016-11-07 (×15): 4 mg via ORAL
  Filled 2016-11-03 (×17): qty 2

## 2016-11-03 MED ORDER — K PHOS MONO-SOD PHOS DI & MONO 155-852-130 MG PO TABS
500.0000 mg | ORAL_TABLET | ORAL | Status: AC
  Administered 2016-11-03 (×4): 500 mg via ORAL
  Filled 2016-11-03 (×4): qty 2

## 2016-11-03 MED ORDER — SODIUM CHLORIDE 0.9 % IV SOLN
INTRAVENOUS | Status: DC
  Administered 2016-11-03: 20 mL/h via INTRAVENOUS
  Administered 2016-11-05: 10 mL/h via INTRAVENOUS

## 2016-11-03 MED ORDER — POTASSIUM CHLORIDE CRYS ER 20 MEQ PO TBCR
40.0000 meq | EXTENDED_RELEASE_TABLET | ORAL | Status: DC
  Administered 2016-11-03: 40 meq via ORAL
  Filled 2016-11-03: qty 2

## 2016-11-03 MED ORDER — POTASSIUM CHLORIDE CRYS ER 20 MEQ PO TBCR
40.0000 meq | EXTENDED_RELEASE_TABLET | ORAL | Status: AC
  Administered 2016-11-03 (×3): 40 meq via ORAL
  Filled 2016-11-03 (×3): qty 2

## 2016-11-03 MED ORDER — K PHOS MONO-SOD PHOS DI & MONO 155-852-130 MG PO TABS
500.0000 mg | ORAL_TABLET | ORAL | Status: AC
  Administered 2016-11-04 (×2): 500 mg via ORAL
  Filled 2016-11-03 (×4): qty 2

## 2016-11-03 MED ORDER — K PHOS MONO-SOD PHOS DI & MONO 155-852-130 MG PO TABS
500.0000 mg | ORAL_TABLET | ORAL | Status: DC
  Filled 2016-11-03 (×2): qty 2

## 2016-11-03 NOTE — Progress Notes (Signed)
[place] for electrolyte management  Indication: severe hypokalemia   Pharmacy consulted for electrolyte management for 51 yo female admitted with diarrhea being treated for hypokalemia.  Patient has orders for Kcl 40 mEq PO x 2 doses.   Plan:  K is 2.7, Phosphorus: 1.5. Will also give KPhos 500 mg PO q4 hours x 4 doses. Will recheck K and Phos @ 1800.   6/30 @ 18:00 :   K = 2.8, Phos = 1.4 Will order additional KPhos 500 mg PO Q4H to start 7/1 @ 0100 and recheck electrolytes on 7/1 with AM labs.   No Known Allergies  Patient Measurements: Height: 4\' 11"  (149.9 cm) Weight: 73 lb 11.2 oz (33.4 kg) IBW/kg (Calculated) : 43.2   Vital Signs: Temp: 98 F (36.7 C) (06/30 1230) BP: 123/95 (06/30 1230) Pulse Rate: 64 (06/30 1230) Intake/Output from previous day: 06/29 0701 - 06/30 0700 In: 1196 [P.O.:1196] Out: 50 [Urine:50] Intake/Output from this shift: Total I/O In: 240 [P.O.:240] Out: 0   Labs:  Recent Labs  10/31/16 1949 11/01/16 0219  11/02/16 0416 11/03/16 0527 11/03/16 1750  WBC 13.0* 13.7*  --  8.7 9.4  --   HGB 15.6 14.3  --  13.0 13.4  --   HCT 46.1 41.6  --  38.2 39.3  --   PLT 366 354  --  323 300  --   CREATININE 2.16* 2.11*  --  1.40* 1.16*  --   MG 2.4  --   < > 2.0 2.1 2.0  PHOS  --   --   --  2.4* 1.5* 1.4*  ALBUMIN 4.1  --   --   --   --   --   PROT 7.4  --   --   --   --   --   AST 57*  --   --   --   --   --   ALT 35  --   --   --   --   --   ALKPHOS 86  --   --   --   --   --   BILITOT 1.7*  --   --   --   --   --   < > = values in this interval not displayed. Estimated Creatinine Clearance: 29.9 mL/min (A) (by C-G formula based on SCr of 1.16 mg/dL (H)).    Medical History: Past Medical History:  Diagnosis Date  . AKI (acute kidney injury) ([place]) 10/31/2016  . Diabetes ([place])   . GERD (gastroesophageal reflux disease)   . Hypertension   . Non-smoker     Pharmacy will continue to monitor and  adjust per consult.   Zian Mohamed D 11/03/2016,6:49 PM

## 2016-11-03 NOTE — Progress Notes (Signed)
[place] for electrolyte management  Indication: severe hypokalemia   Pharmacy consulted for electrolyte management for 51 yo female admitted with diarrhea being treated for hypokalemia.  Patient has orders for Kcl 40 mEq PO x 2 doses.   Plan:  K is 2.7, Phosphorus: 1.5. Will also give KPhos 500 mg PO q4 hours x 4 doses. Will recheck K and Phos @ 1800.   No Known Allergies  Patient Measurements: Height: 4\' 11"  (149.9 cm) Weight: 73 lb 11.2 oz (33.4 kg) IBW/kg (Calculated) : 43.2   Vital Signs: Temp: 98.2 F (36.8 C) (06/30 0425) Temp Source: Oral (06/30 0425) BP: 94/68 (06/30 0425) Pulse Rate: 94 (06/30 0425) Intake/Output from previous day: 06/29 0701 - 06/30 0700 In: 1196 [P.O.:1196] Out: 50 [Urine:50] Intake/Output from this shift: No intake/output data recorded.  Labs:  Recent Labs  10/31/16 1949 11/01/16 0219 11/01/16 1410 11/02/16 0416 11/03/16 0527  WBC 13.0* 13.7*  --  8.7 9.4  HGB 15.6 14.3  --  13.0 13.4  HCT 46.1 41.6  --  38.2 39.3  PLT 366 354  --  323 300  CREATININE 2.16* 2.11*  --  1.40* 1.16*  MG 2.4  --  2.2 2.0 2.1  PHOS  --   --   --  2.4* 1.5*  ALBUMIN 4.1  --   --   --   --   PROT 7.4  --   --   --   --   AST 57*  --   --   --   --   ALT 35  --   --   --   --   ALKPHOS 86  --   --   --   --   BILITOT 1.7*  --   --   --   --    Estimated Creatinine Clearance: 29.9 mL/min (A) (by C-G formula based on SCr of 1.16 mg/dL (H)).    Medical History: Past Medical History:  Diagnosis Date  . AKI (acute kidney injury) ([place]) 10/31/2016  . Diabetes ([place])   . GERD (gastroesophageal reflux disease)   . Hypertension   . Non-smoker     Pharmacy will continue to monitor and adjust per consult.   Nira Visscher D 11/03/2016,8:37 AM

## 2016-11-03 NOTE — Progress Notes (Signed)
Dr Marcille Blanco notified of critical potassium 2.7, acknowledged, ordered to follow previous order 66meq K-Dur at [facility]

## 2016-11-03 NOTE — Progress Notes (Signed)
[facility] [place] NAME: Erin Hayes    MR#:  [id number]  DATE OF BIRTH:  01/09/1966  SUBJECTIVE:  CHIEF COMPLAINT:   Chief Complaint  Patient presents with  . Diarrhea  . Weakness   Patient diarrhea still persists potassium with some improvement  . REVIEW OF SYSTEMS:  Review of Systems  Constitutional: Positive for malaise/fatigue. Negative for chills, fever and weight loss.  HENT: Negative for nosebleeds and sore throat.   Eyes: Negative for blurred vision.  Respiratory: Negative for cough, shortness of breath and wheezing.   Cardiovascular: Negative for chest pain, orthopnea, leg swelling and PND.  Gastrointestinal: Positive for diarrhea. Negative for abdominal pain, constipation, heartburn, nausea and vomiting.  Genitourinary: Negative for dysuria and urgency.  Musculoskeletal: Negative for back pain.  Skin: Negative for rash.  Neurological: Positive for weakness. Negative for dizziness, speech change, focal weakness and headaches.  Endo/Heme/Allergies: Does not bruise/bleed easily.  Psychiatric/Behavioral: Negative for depression.   DRUG ALLERGIES:  No Known Allergies VITALS:  Blood pressure (!) 123/95, pulse 64, temperature 98 F (36.7 C), resp. rate 16, height 4\' 11"  (1.499 m), weight 73 lb 11.2 oz (33.4 kg), SpO2 (!) 88 %. PHYSICAL EXAMINATION:  Physical Exam LABORATORY PANEL:  Female CBC  Recent Labs Lab 11/03/16 0527  WBC 9.4  HGB 13.4  HCT 39.3  PLT 300   ------------------------------------------------------------------------------------------------------------------ Chemistries   Recent Labs Lab 10/31/16 1949  11/03/16 0527  NA 137  < > 135  K <2.0*  < > 2.7*  CL 104  < > 112*  CO2 15*  < > 17*  GLUCOSE 144*  < > 117*  BUN 41*  < > 24*  CREATININE 2.16*  < > 1.16*  CALCIUM 9.0  < > 8.0*  MG 2.4  < > 2.1  AST 57*  --   --   ALT 35  --   --   ALKPHOS 86  --   --   BILITOT 1.7*  --   --   <  > = values in this interval not displayed. RADIOLOGY:  No results found. ASSESSMENT AND PLAN:   * AKI (acute kidney injury) ([place]) - due to her diarrhea and dehydration. Continue IV fluids, avoid nephrotoxins, Improved  * Severe Hypokalemia - I will give her oral meds potassium but will recheck potassium GI wants to do a sigmoidoscopy with potassium greater than 3 we'll give her IV potassium later if his low   * chronic Diarrhea -  Could be due to chronic illnesses IBD other type colitis - Negative stool studies and c.diff  *  gram-negative rods UTI (urinary tract infection) - IV Rocephin, urine culture growing ecoli change to ceftin  * Diabetes ([place]) - sliding scale insulin with corresponding glucose checks    Hypertension - continue home meds     All the records are reviewed and case discussed with Care Management/Social Worker. Management plans discussed with the patient, nursing and they are in agreement.  CODE STATUS: Full Code  TOTAL TIME TAKING CARE OF THIS PATIENT: 32 minutes.   More than 50% of the time was spent in counseling/coordination of care: YES  POSSIBLE D/C IN 1-2 DAYS, DEPENDING ON CLINICAL CONDITION.  And GI eval   Dustin Flock M.D on 11/03/2016 at 2:05 PM  Between 7am to 6pm - Pager - [phone]  After 6pm go to [email or web address] - Proofreader  [employer]  [phone]  CC: Primary care physician; Patient, No Pcp Per  Note: This dictation was prepared with Dragon dictation along with smaller phrase technology. Any transcriptional errors that result from this process are unintentional.

## 2016-11-03 NOTE — Consult Note (Signed)
Referring Provider: Dr. Posey Pronto Primary Care Physician:  Patient, No Pcp Per Primary Gastroenterologist:  Althia Forts  Reason for Consultation:  Diarrhea  HPI: Erin Hayes is a 51 y.o. female with chronic watery diarrhea (occurring multiple times per day) for the past 3 months and denies associated abdominal pain, melena, or hematochezia. Denies weight loss, nausea or vomiting. Tolerating solid food without difficulty. GI pathogen panel negative and C. Diff negative. Abd CT on 10/02/16 showed colonic wall thickening. Denies previous colonoscopy. She feels like her diarrhea has not improved during her admission.  Past Medical History:  Diagnosis Date  . AKI (acute kidney injury) ([place]) 10/31/2016  . Diabetes ([place])   . GERD (gastroesophageal reflux disease)   . Hypertension   . Non-smoker     Past Surgical History:  Procedure Laterality Date  . LITHOTRIPSY      Prior to Admission medications   Medication Sig Start Date End Date Taking? Authorizing Provider  amLODipine (NORVASC) 5 MG tablet Take 1 tablet (5 mg total) by mouth daily. 10/18/15  Yes Odem, Coolidge Breeze, FNP  aspirin 81 MG tablet Take 81 mg by mouth daily.   Yes [provider]  hydrochlorothiazide (HYDRODIURIL) 25 MG tablet Take 1 tablet (25 mg total) by mouth daily. 10/14/15  Yes McGowan, Larene Beach A, PA-C  metFORMIN (GLUCOPHAGE) 500 MG tablet Take 1 tablet (500 mg total) by mouth 2 (two) times daily with a meal. 10/14/15  Yes McGowan, Larene Beach A, PA-C  insulin glargine (LANTUS) 100 UNIT/ML injection Inject 0.25 mLs (25 Units total) into the skin at bedtime. Patient not taking: Reported on 10/31/2016 11/22/15   Bertram Gala, PA  lisinopril (PRINIVIL,ZESTRIL) 20 MG tablet Take 2 tablets (40 mg total) by mouth at bedtime. Patient not taking: Reported on 10/31/2016 11/22/15   Bertram Gala, PA    Scheduled Meds: . aspirin  81 mg Oral Daily  . feeding supplement (ENSURE ENLIVE)  237 mL Oral TID BM  . heparin  5,000 Units  Subcutaneous Q8H  . insulin aspart  0-5 Units Subcutaneous QHS  . insulin aspart  0-9 Units Subcutaneous TID WC  . loperamide  4 mg Oral Q6H  . multivitamin with minerals  1 tablet Oral Daily  . phosphorus  500 mg Oral Q4H  . potassium chloride  40 mEq Oral Q4H  . zinc oxide   Topical BID   Continuous Infusions: . cefTRIAXone (ROCEPHIN)  IV Stopped (11/02/16 1803)   PRN Meds:.acetaminophen **OR** acetaminophen, ondansetron **OR** ondansetron (ZOFRAN) IV  Allergies as of 10/31/2016  . (No Known Allergies)    Family History  Problem Relation Age of Onset  . Hypertension Father     Social History   Social History  . Marital status: Single    Spouse name: N/A  . Number of children: N/A  . Years of education: N/A   Occupational History  . Not on file.   Social History Main Topics  . Smoking status: Never Smoker  . Smokeless tobacco: Never Used  . Alcohol use No  . Drug use: No  . Sexual activity: Not on file   Other Topics Concern  . Not on file   Social History Narrative  . No narrative on file    Review of Systems: All negative except as stated above in HPI.  Physical Exam: Vital signs: Vitals:   11/02/16 2026 11/03/16 0425  BP: (!) 125/95 94/68  Pulse: 82 94  Resp: 20 18  Temp: 98.3 F (36.8 C) 98.2 F (  36.8 C)   Last BM Date: 11/03/16 General:   Alert,  Thin, pleasant and cooperative in NAD HEENT: anicteric sclera, oropharynx clear Neck: supple, nontender Lungs:  Clear throughout to auscultation.   No wheezes, crackles, or rhonchi. No acute distress. Heart:  Regular rate and rhythm; no murmurs, clicks, rubs,  or gallops. Abdomen: soft, nontender, nondistended, +BS  Rectal:  Deferred Ext: no edema  GI:  Lab Results:  Recent Labs  11/01/16 0219 11/02/16 0416 11/03/16 0527  WBC 13.7* 8.7 9.4  HGB 14.3 13.0 13.4  HCT 41.6 38.2 39.3  PLT 354 323 300   BMET  Recent Labs  11/01/16 0219 11/02/16 0416 11/02/16 1314 11/02/16 2057  11/03/16 0527  NA 136 136  --   --  135  K 2.6* <2.0* 2.7* 2.7* 2.7*  CL 102 110  --   --  112*  CO2 18* 19*  --   --  17*  GLUCOSE 137* 112*  --   --  117*  BUN 41* 32*  --   --  24*  CREATININE 2.11* 1.40*  --   --  1.16*  CALCIUM 8.2* 8.1*  --   --  8.0*   LFT  Recent Labs  10/31/16 1949  PROT 7.4  ALBUMIN 4.1  AST 57*  ALT 35  ALKPHOS 86  BILITOT 1.7*   PT/INR No results for input(s): LABPROT, INR in the last 72 hours.   Studies/Results: No results found.  Impression/Plan: 51 yo with chronic watery diarrhea with negative stool studies (negative GI pathogen panel and negative C. Diff). She still could have pseudomembranous colitis and will do an unprepped flexible sigmoidoscopy with sedation tomorrow. Microscopic colitis also possible. Doubt ulcerative colitis without rectal bleeding or abdominal pain. Replace K per primary team. Potassium needs to be at least 3 for her to have sedation for her flex sig. Clear liquid diet this evening. NPO p MN. Continue supportive care.    LOS: 3 days   [place] C.  11/03/2016, 12:04 PM

## 2016-11-04 ENCOUNTER — Inpatient Hospital Stay: Payer: Self-pay | Admitting: Anesthesiology

## 2016-11-04 ENCOUNTER — Encounter
Admission: EM | Disposition: A | Discharge status: Home / Self Care | Payer: Self-pay | Source: Home / Self Care | Attending: Internal Medicine

## 2016-11-04 ENCOUNTER — Encounter: Payer: Self-pay | Admitting: *Deleted

## 2016-11-04 HISTORY — PX: FLEXIBLE SIGMOIDOSCOPY: SHX5431

## 2016-11-04 LAB — BASIC METABOLIC PANEL
ANION GAP: 7 (ref 5–15)
BUN: 18 mg/dL (ref 6–20)
CHLORIDE: 113 mmol/L — AB (ref 101–111)
CO2: 15 mmol/L — ABNORMAL LOW (ref 22–32)
Calcium: 8.2 mg/dL — ABNORMAL LOW (ref 8.9–10.3)
Creatinine, Ser: 0.97 mg/dL (ref 0.44–1.00)
GFR calc Af Amer: >60 mL/min (ref >60)
Glucose, Bld: 116 mg/dL — ABNORMAL HIGH (ref 65–99)
POTASSIUM: 3.3 mmol/L — AB (ref 3.5–5.1)
Sodium: 135 mmol/L (ref 135–145)

## 2016-11-04 LAB — GLUCOSE, CAPILLARY
GLUCOSE-CAPILLARY: 118 mg/dL — AB (ref 65–99)
Glucose-Capillary: 169 mg/dL — ABNORMAL HIGH (ref 65–99)
Glucose-Capillary: 159 mg/dL — ABNORMAL HIGH (ref 65–99)
Glucose-Capillary: 109 mg/dL — ABNORMAL HIGH (ref 65–99)

## 2016-11-04 LAB — PHOSPHORUS: PHOSPHORUS: 2.1 mg/dL — AB (ref 2.5–4.6)

## 2016-11-04 SURGERY — SIGMOIDOSCOPY, FLEXIBLE
Anesthesia: General

## 2016-11-04 MED ORDER — LIDOCAINE HCL (CARDIAC) 20 MG/ML IV SOLN
INTRAVENOUS | Status: DC | PRN
  Administered 2016-11-04: 60 mg via INTRAVENOUS

## 2016-11-04 MED ORDER — CEFUROXIME AXETIL 500 MG PO TABS
500.0000 mg | ORAL_TABLET | Freq: Two times a day (BID) | ORAL | Status: DC
  Administered 2016-11-05 – 2016-11-07 (×6): 500 mg via ORAL
  Filled 2016-11-04 (×7): qty 1

## 2016-11-04 MED ORDER — MIDAZOLAM HCL 2 MG/2ML IJ SOLN
INTRAMUSCULAR | Status: AC
  Filled 2016-11-04: qty 2

## 2016-11-04 MED ORDER — PROPOFOL 500 MG/50ML IV EMUL
INTRAVENOUS | Status: DC | PRN
  Administered 2016-11-04: 100 ug/kg/min via INTRAVENOUS

## 2016-11-04 MED ORDER — PROPOFOL 500 MG/50ML IV EMUL
INTRAVENOUS | Status: AC
  Filled 2016-11-04: qty 50

## 2016-11-04 MED ORDER — LIDOCAINE HCL (PF) 2 % IJ SOLN
INTRAMUSCULAR | Status: AC
  Filled 2016-11-04: qty 2

## 2016-11-04 MED ORDER — PROPOFOL 10 MG/ML IV BOLUS
INTRAVENOUS | Status: DC | PRN
  Administered 2016-11-04: 20 mg via INTRAVENOUS

## 2016-11-04 NOTE — OR Nursing (Signed)
Flex Sigmoid done got to transverse colon.

## 2016-11-04 NOTE — H&P (View-Only) (Signed)
Referring Provider: Dr. Posey Pronto Primary Care Physician:  Patient, No Pcp Per Primary Gastroenterologist:  Althia Forts  Reason for Consultation:  Diarrhea  HPI: Erin Hayes is a 51 y.o. female with chronic watery diarrhea (occurring multiple times per day) for the past 3 months and denies associated abdominal pain, melena, or hematochezia. Denies weight loss, nausea or vomiting. Tolerating solid food without difficulty. GI pathogen panel negative and C. Diff negative. Abd CT on 10/02/16 showed colonic wall thickening. Denies previous colonoscopy. She feels like her diarrhea has not improved during her admission.  Past Medical History:  Diagnosis Date  . AKI (acute kidney injury) ([place]) 10/31/2016  . Diabetes ([place])   . GERD (gastroesophageal reflux disease)   . Hypertension   . Non-smoker     Past Surgical History:  Procedure Laterality Date  . LITHOTRIPSY      Prior to Admission medications   Medication Sig Start Date End Date Taking? Authorizing Provider  amLODipine (NORVASC) 5 MG tablet Take 1 tablet (5 mg total) by mouth daily. 10/18/15  Yes Odem, Coolidge Breeze, FNP  aspirin 81 MG tablet Take 81 mg by mouth daily.   Yes [provider]  hydrochlorothiazide (HYDRODIURIL) 25 MG tablet Take 1 tablet (25 mg total) by mouth daily. 10/14/15  Yes McGowan, Larene Beach A, PA-C  metFORMIN (GLUCOPHAGE) 500 MG tablet Take 1 tablet (500 mg total) by mouth 2 (two) times daily with a meal. 10/14/15  Yes McGowan, Larene Beach A, PA-C  insulin glargine (LANTUS) 100 UNIT/ML injection Inject 0.25 mLs (25 Units total) into the skin at bedtime. Patient not taking: Reported on 10/31/2016 11/22/15   Bertram Gala, PA  lisinopril (PRINIVIL,ZESTRIL) 20 MG tablet Take 2 tablets (40 mg total) by mouth at bedtime. Patient not taking: Reported on 10/31/2016 11/22/15   Bertram Gala, PA    Scheduled Meds: . aspirin  81 mg Oral Daily  . feeding supplement (ENSURE ENLIVE)  237 mL Oral TID BM  . heparin  5,000 Units  Subcutaneous Q8H  . insulin aspart  0-5 Units Subcutaneous QHS  . insulin aspart  0-9 Units Subcutaneous TID WC  . loperamide  4 mg Oral Q6H  . multivitamin with minerals  1 tablet Oral Daily  . phosphorus  500 mg Oral Q4H  . potassium chloride  40 mEq Oral Q4H  . zinc oxide   Topical BID   Continuous Infusions: . cefTRIAXone (ROCEPHIN)  IV Stopped (11/02/16 1803)   PRN Meds:.acetaminophen **OR** acetaminophen, ondansetron **OR** ondansetron (ZOFRAN) IV  Allergies as of 10/31/2016  . (No Known Allergies)    Family History  Problem Relation Age of Onset  . Hypertension Father     Social History   Social History  . Marital status: Single    Spouse name: N/A  . Number of children: N/A  . Years of education: N/A   Occupational History  . Not on file.   Social History Main Topics  . Smoking status: Never Smoker  . Smokeless tobacco: Never Used  . Alcohol use No  . Drug use: No  . Sexual activity: Not on file   Other Topics Concern  . Not on file   Social History Narrative  . No narrative on file    Review of Systems: All negative except as stated above in HPI.  Physical Exam: Vital signs: Vitals:   11/02/16 2026 11/03/16 0425  BP: (!) 125/95 94/68  Pulse: 82 94  Resp: 20 18  Temp: 98.3 F (36.8 C) 98.2 F (  36.8 C)   Last BM Date: 11/03/16 General:   Alert,  Thin, pleasant and cooperative in NAD HEENT: anicteric sclera, oropharynx clear Neck: supple, nontender Lungs:  Clear throughout to auscultation.   No wheezes, crackles, or rhonchi. No acute distress. Heart:  Regular rate and rhythm; no murmurs, clicks, rubs,  or gallops. Abdomen: soft, nontender, nondistended, +BS  Rectal:  Deferred Ext: no edema  GI:  Lab Results:  Recent Labs  11/01/16 0219 11/02/16 0416 11/03/16 0527  WBC 13.7* 8.7 9.4  HGB 14.3 13.0 13.4  HCT 41.6 38.2 39.3  PLT 354 323 300   BMET  Recent Labs  11/01/16 0219 11/02/16 0416 11/02/16 1314 11/02/16 2057  11/03/16 0527  NA 136 136  --   --  135  K 2.6* <2.0* 2.7* 2.7* 2.7*  CL 102 110  --   --  112*  CO2 18* 19*  --   --  17*  GLUCOSE 137* 112*  --   --  117*  BUN 41* 32*  --   --  24*  CREATININE 2.11* 1.40*  --   --  1.16*  CALCIUM 8.2* 8.1*  --   --  8.0*   LFT  Recent Labs  10/31/16 1949  PROT 7.4  ALBUMIN 4.1  AST 57*  ALT 35  ALKPHOS 86  BILITOT 1.7*   PT/INR No results for input(s): LABPROT, INR in the last 72 hours.   Studies/Results: No results found.  Impression/Plan: 51 yo with chronic watery diarrhea with negative stool studies (negative GI pathogen panel and negative C. Diff). She still could have pseudomembranous colitis and will do an unprepped flexible sigmoidoscopy with sedation tomorrow. Microscopic colitis also possible. Doubt ulcerative colitis without rectal bleeding or abdominal pain. Replace K per primary team. Potassium needs to be at least 3 for her to have sedation for her flex sig. Clear liquid diet this evening. NPO p MN. Continue supportive care.    LOS: 3 days   [place] C.  11/03/2016, 12:04 PM

## 2016-11-04 NOTE — Transfer of Care (Addendum)
Immediate Anesthesia Transfer of Care Note  Patient: Erin Hayes  Procedure(s) Performed: Procedure(s): FLEXIBLE SIGMOIDOSCOPY (N/A)  Patient Location: PACU  Anesthesia Type:General  Level of Consciousness: sedated  Airway & Oxygen Therapy: Patient Spontanous Breathing and Patient connected to nasal cannula oxygen  Post-op Assessment: Report given to RN and Post -op Vital signs reviewed and stable  Post vital signs: Reviewed and stable  Last Vitals:  Vitals:   11/03/16 2039 11/04/16 0840               11/04/16  0940  BP: 106/82 125/83                    99/77  Pulse: 86                                  66  Resp: 17 17                            16   Temp: 36.3 C                                  97.8    Last Pain:  Vitals:   11/03/16 2039  TempSrc: Oral  PainSc:          Complications: No apparent anesthesia complications

## 2016-11-04 NOTE — Anesthesia Preprocedure Evaluation (Signed)
Anesthesia Evaluation  Patient identified by MRN, date of birth, ID band Patient awake    Reviewed: Allergy & Precautions, H&P , NPO status , Patient's Chart, lab work & pertinent test results, reviewed documented beta blocker date and time   Airway Mallampati: II   Neck ROM: full    Dental  (+) Poor Dentition, Loose   Pulmonary neg pulmonary ROS,    Pulmonary exam normal        Cardiovascular Exercise Tolerance: Poor hypertension, Pt. on medications negative cardio ROS Normal cardiovascular exam Rhythm:Regular Rate:Normal     Neuro/Psych Hx of encephelopathy negative neurological ROS  negative psych ROS   GI/Hepatic negative GI ROS, Neg liver ROS, GERD  Medicated,  Endo/Other  negative endocrine ROSdiabetes, Poorly Controlled, Type 2, Oral Hypoglycemic AgentsHx of DKA  Renal/GU ARFRenal diseasenegative Renal ROS  negative genitourinary   Musculoskeletal   Abdominal   Peds  Hematology negative hematology ROS (+)   Anesthesia Other Findings Past Medical History: 10/31/2016: AKI (acute kidney injury) ([place]) No date: Diabetes ([place]) No date: GERD (gastroesophageal reflux disease) No date: Hypertension No date: Non-smoker Past Surgical History: No date: LITHOTRIPSY BMI    Body Mass Index:  14.89 kg/m     Reproductive/Obstetrics negative OB ROS                             Anesthesia Physical Anesthesia Plan  ASA: III and emergent  Anesthesia Plan: General   Post-op Pain Management:    Induction:   PONV Risk Score and Plan:   Airway Management Planned:   Additional Equipment:   Intra-op Plan:   Post-operative Plan:   Informed Consent: I have reviewed the patients History and Physical, chart, labs and discussed the procedure including the risks, benefits and alternatives for the proposed anesthesia with the patient or authorized representative who has indicated his/her  understanding and acceptance.   Dental Advisory Given  Plan Discussed with: CRNA  Anesthesia Plan Comments:         Anesthesia Quick Evaluation

## 2016-11-04 NOTE — Op Note (Signed)
[facility] Gastroenterology Patient Name: Erin Hayes Procedure Date: 11/04/2016 8:05 AM MRN: [id number] Account #: [id number] Date of Birth: 05/21/1965 Admit Type: Inpatient Age: 51 Room: [facility] ENDO ROOM 4 Gender: Female Note Status: Finalized Procedure:            Flexible Sigmoidoscopy Indications:          Diarrhea Providers:            Lear Ng, MD Medicines:            Propofol per Anesthesia, Monitored Anesthesia Care Complications:        No immediate complications. Procedure:            Pre-Anesthesia Assessment:                       - Prior to the procedure, a History and Physical was                        performed, and patient medications and allergies were                        reviewed. The patient's tolerance of previous                        anesthesia was also reviewed. The risks and benefits of                        the procedure and the sedation options and risks were                        discussed with the patient. All questions were                        answered, and informed consent was obtained. Prior                        Anticoagulants: The patient has taken no previous                        anticoagulant or antiplatelet agents. ASA Grade                        Assessment: III - A patient with severe systemic                        disease. After reviewing the risks and benefits, the                        patient was deemed in satisfactory condition to undergo                        the procedure.                       After obtaining informed consent, the scope was passed                        under direct vision. The Colonoscope was introduced                        through the anus  and advanced to the the left                        transverse colon. The flexible sigmoidoscopy was                        accomplished without difficulty. The patient tolerated                        the procedure well. The quality  of the bowel                        preparation was an unprepped procedure. Findings:      The perianal exam findings include non-thrombosed external hemorrhoids.      A diffuse area of mildly congested mucosa was found from rectum to       transverse colon. Biopsies were taken with a cold forceps for histology.       Estimated blood loss was minimal.      A few small and large-mouthed diverticula were found in the sigmoid       colon.      Internal hemorrhoids were found during retroflexion. The hemorrhoids       were small and Grade I (internal hemorrhoids that do not prolapse). Impression:           - Non-thrombosed external hemorrhoids found on perianal                        exam.                       - Congested mucosa from rectum to transverse colon.                        Biopsied.                       - Diverticulosis in the sigmoid colon.                       - Internal hemorrhoids. Recommendation:       - Await pathology results.                       - Continue present medications. Procedure Code(s):    --- Professional ---                       [id number], Sigmoidoscopy, flexible; with biopsy, single or                        multiple Diagnosis Code(s):    --- Professional ---                       R19.7, Diarrhea, unspecified                       K63.89, Other specified diseases of intestine                       K64.0, First degree hemorrhoids                       K64.4, Residual hemorrhoidal skin tags  K57.30, Diverticulosis of large intestine without                        perforation or abscess without bleeding CPT copyright 2016 [employer]. All rights reserved. The codes documented in this report are preliminary and upon coder review may  be revised to meet current compliance requirements. Lear Ng, MD 11/04/2016 9:50:33 AM This report has been signed electronically. Number of Addenda: 0 Note Initiated On: 11/04/2016  8:05 AM      [facility]

## 2016-11-04 NOTE — Progress Notes (Signed)
[facility] [place] NAME: Erin Hayes    MR#:  [id number]  DATE OF BIRTH:  [birth year]  SUBJECTIVE:  CHIEF COMPLAINT:   Chief Complaint  Patient presents with  . Diarrhea  . Weakness   States some improvement in diarrheaDenies abdominal pain feels better  . REVIEW OF SYSTEMS:  Review of Systems  Constitutional: Negative for chills, fever, malaise/fatigue and weight loss.  HENT: Negative for nosebleeds and sore throat.   Eyes: Negative for blurred vision.  Respiratory: Negative for cough, shortness of breath and wheezing.   Cardiovascular: Negative for chest pain, orthopnea, leg swelling and PND.  Gastrointestinal: Positive for diarrhea. Negative for abdominal pain, constipation, heartburn, nausea and vomiting.  Genitourinary: Negative for dysuria and urgency.  Musculoskeletal: Negative for back pain.  Skin: Negative for rash.  Neurological: Negative for dizziness, speech change, focal weakness, weakness and headaches.  Endo/Heme/Allergies: Does not bruise/bleed easily.  Psychiatric/Behavioral: Negative for depression.   DRUG ALLERGIES:  No Known Allergies VITALS:  Blood pressure 106/77, pulse 75, temperature 98.7 F (37.1 C), temperature source Oral, resp. rate 17, height 4\' 11"  (1.499 m), weight 73 lb 11.2 oz (33.4 kg), SpO2 99 %. PHYSICAL EXAMINATION:  Physical Exam LABORATORY PANEL:  Female CBC  Recent Labs Lab 11/03/16 0527  WBC 9.4  HGB 13.4  HCT 39.3  PLT 300   ------------------------------------------------------------------------------------------------------------------ Chemistries   Recent Labs Lab 10/31/16 1949  11/03/16 1750 11/04/16 0541  NA 137  < >  --  135  K <2.0*  < > 2.8* 3.3*  CL 104  < >  --  113*  CO2 15*  < >  --  15*  GLUCOSE 144*  < >  --  116*  BUN 41*  < >  --  18  CREATININE 2.16*  < >  --  0.97  CALCIUM 9.0  < >  --  8.2*  MG 2.4  < > 2.0  --   AST 57*  --   --   --   ALT 35   --   --   --   ALKPHOS 86  --   --   --   BILITOT 1.7*  --   --   --   < > = values in this interval not displayed. RADIOLOGY:  No results found. ASSESSMENT AND PLAN:   * AKI (acute kidney injury) ([place]) - due to her diarrhea and dehydration. Now resolved. IV fluids  * Severe Hypokalemia - Improved continue replace  * chronic Diarrhea -  Could be due to chronic illnesses IBD other type colitis - Negative stool studies and c.diff Status post sigmoidoscopy with biopsy  *  gram-negative rods UTI (urinary tract infection) - IV Rocephin, urine culture growing ecoli change to ceftin  * Diabetes ([place]) - sliding scale insulin with corresponding glucose checks    Hypertension - continue home meds     All the records are reviewed and case discussed with Care Management/Social Worker. Management plans discussed with the patient, nursing and they are in agreement.  CODE STATUS: Full Code  TOTAL TIME TAKING CARE OF THIS PATIENT: 32 minutes.   More than 50% of the time was spent in counseling/coordination of care: YES  POSSIBLE D/C IN 1-2 DAYS, DEPENDING ON CLINICAL CONDITION.  And GI eval   Dustin Flock M.D on 11/04/2016 at 11:44 AM  Between 7am to 6pm - Pager - [phone]  After 6pm go to [email or web address] - password  [email or web address]  [facility]  Office  [phone]  CC: Primary care physician; Patient, No Pcp Per  Note: This dictation was prepared with Dragon dictation along with smaller phrase technology. Any transcriptional errors that result from this process are unintentional.

## 2016-11-04 NOTE — OR Nursing (Signed)
On arrival patient IV in right upper arm was infiltrated, I discontinued this site.

## 2016-11-04 NOTE — Brief Op Note (Signed)
Non-specific colitis with diffuse colonic edema consistent with her chronic diarrhea. NO pseudomembranes seen. Semi-liquid stool scattered throughout examined colon. Random biopsies taken. Resume diet. Supportive care. If diarrhea improves ok to d/c tomorrow from GI standpoint. Replace potassium per primary team. F/U on path as outpt. Dr. Vicente Males will f/u tomorrow.

## 2016-11-04 NOTE — Interval H&P Note (Signed)
History and Physical Interval Note:  11/04/2016 9:19 AM  Erin Hayes  has presented today for surgery, with the diagnosis of Diarrhea  The various methods of treatment have been discussed with the patient and family. After consideration of risks, benefits and other options for treatment, the patient has consented to  Procedure(s): FLEXIBLE SIGMOIDOSCOPY (N/A) as a surgical intervention .  The patient's history has been reviewed, patient examined, no change in status, stable for surgery.  I have reviewed the patient's chart and labs.  Questions were answered to the patient's satisfaction.     [name]

## 2016-11-04 NOTE — Anesthesia Post-op Follow-up Note (Cosign Needed)
Anesthesia QCDR form completed.        

## 2016-11-04 NOTE — Anesthesia Postprocedure Evaluation (Signed)
Anesthesia Post Note  Patient: Erin Hayes  Procedure(s) Performed: Procedure(s) (LRB): FLEXIBLE SIGMOIDOSCOPY (N/A)  Patient location during evaluation: PACU Anesthesia Type: General Level of consciousness: awake and alert Pain management: pain level controlled Vital Signs Assessment: post-procedure vital signs reviewed and stable Respiratory status: spontaneous breathing, nonlabored ventilation, respiratory function stable and patient connected to nasal cannula oxygen Cardiovascular status: blood pressure returned to baseline and stable Postop Assessment: no signs of nausea or vomiting Anesthetic complications: no     Last Vitals:  Vitals:   11/04/16 1014 11/04/16 1038  BP:  106/77  Pulse: 73 75  Resp: 18 17  Temp:  37.1 C    Last Pain:  Vitals:   11/04/16 1038  TempSrc: Oral  PainSc:                  Molli Barrows

## 2016-11-05 ENCOUNTER — Encounter: Payer: Self-pay | Admitting: Gastroenterology

## 2016-11-05 LAB — GLUCOSE, CAPILLARY
GLUCOSE-CAPILLARY: 178 mg/dL — AB (ref 65–99)
GLUCOSE-CAPILLARY: 193 mg/dL — AB (ref 65–99)
Glucose-Capillary: 184 mg/dL — ABNORMAL HIGH (ref 65–99)
Glucose-Capillary: 228 mg/dL — ABNORMAL HIGH (ref 65–99)

## 2016-11-05 LAB — BASIC METABOLIC PANEL
Anion gap: 7 (ref 5–15)
BUN: 16 mg/dL (ref 6–20)
CALCIUM: 7.6 mg/dL — AB (ref 8.9–10.3)
CO2: 13 mmol/L — ABNORMAL LOW (ref 22–32)
CREATININE: 0.95 mg/dL (ref 0.44–1.00)
Chloride: 113 mmol/L — ABNORMAL HIGH (ref 101–111)
GFR calc Af Amer: >60 mL/min (ref >60)
GLUCOSE: 100 mg/dL — AB (ref 65–99)
Potassium: 2.2 mmol/L — CL (ref 3.5–5.1)
SODIUM: 133 mmol/L — AB (ref 135–145)

## 2016-11-05 LAB — MAGNESIUM: MAGNESIUM: 1.7 mg/dL (ref 1.7–2.4)

## 2016-11-05 LAB — POTASSIUM: Potassium: 2.7 mmol/L — CL (ref 3.5–5.1)

## 2016-11-05 LAB — PHOSPHORUS: Phosphorus: 2.8 mg/dL (ref 2.5–4.6)

## 2016-11-05 LAB — CORTISOL: CORTISOL PLASMA: 8.8 ug/dL

## 2016-11-05 MED ORDER — POTASSIUM CHLORIDE 20 MEQ PO PACK
40.0000 meq | PACK | ORAL | Status: AC
  Administered 2016-11-06 (×2): 40 meq via ORAL
  Filled 2016-11-05 (×2): qty 2

## 2016-11-05 MED ORDER — MAGNESIUM SULFATE 2 GM/50ML IV SOLN
2.0000 g | Freq: Once | INTRAVENOUS | Status: AC
  Administered 2016-11-06: 2 g via INTRAVENOUS
  Filled 2016-11-05: qty 50

## 2016-11-05 MED ORDER — POTASSIUM CHLORIDE 10 MEQ/100ML IV SOLN: 10.0000 meq | INTRAVENOUS | Status: DC

## 2016-11-05 MED ORDER — POTASSIUM PHOSPHATES 15 MMOLE/5ML IV SOLN
30.0000 mmol | Freq: Once | INTRAVENOUS | Status: AC
  Administered 2016-11-05: 30 mmol via INTRAVENOUS
  Filled 2016-11-05: qty 10

## 2016-11-05 MED ORDER — MAGNESIUM SULFATE 2 GM/50ML IV SOLN
2.0000 g | Freq: Once | INTRAVENOUS | Status: AC
  Administered 2016-11-05: 2 g via INTRAVENOUS
  Filled 2016-11-05: qty 50

## 2016-11-05 MED ORDER — POTASSIUM CHLORIDE CRYS ER 20 MEQ PO TBCR
40.0000 meq | EXTENDED_RELEASE_TABLET | ORAL | Status: AC
  Administered 2016-11-05 (×2): 40 meq via ORAL
  Filled 2016-11-05 (×2): qty 2

## 2016-11-05 NOTE — Progress Notes (Signed)
[facility] for electrolyte management  Indication: severe hypokalemia   Pharmacy consulted for electrolyte management for 51 yo female admitted with diarrhea being treated for hypokalemia.  Patient ordered potassium 29mEq PO Q4hr.   Plan:  Order magnesium 2g IV x 1.    Patient previously did not tolerate IV potassium chloride replacement. Will order potassium phosphate 77mmol IV Q24hr. Will recheck potassium at 2000 and all other electrolytes with am labs.   No Known Allergies  Patient Measurements: Height: 4\' 11"  (149.9 cm) Weight: 73 lb 11.2 oz (33.4 kg) IBW/kg (Calculated) : 43.2   Vital Signs: Temp: 99.1 F (37.3 C) (07/02 1317) Temp Source: Oral (07/02 1317) BP: 92/71 (07/02 1317) Pulse Rate: 75 (07/02 1317) Intake/Output from previous day: 07/01 0701 - 07/02 0700 In: 838 [I.V.:838] Out: 675 [Urine:675] Intake/Output from this shift: Total I/O In: 1029.5 [P.O.:480; I.V.:149.5; IV Piggyback:400] Out: 9166 [Urine:1575]  Labs:  Recent Labs  11/03/16 0527 11/03/16 1750 11/04/16 0541 11/05/16 0450  WBC 9.4  --   --   --   HGB 13.4  --   --   --   HCT 39.3  --   --   --   PLT 300  --   --   --   CREATININE 1.16*  --  0.97 0.95  MG 2.1 2.0  --  1.7  PHOS 1.5* 1.4* 2.1* 2.8   Estimated Creatinine Clearance: 36.5 mL/min (by C-G formula based on SCr of 0.95 mg/dL).    Medical History: Past Medical History:  Diagnosis Date  . AKI (acute kidney injury) ([place]) 10/31/2016  . Diabetes ([place])   . GERD (gastroesophageal reflux disease)   . Hypertension   . Non-smoker     Pharmacy will continue to monitor and adjust per consult.   Simpson,Michael L 11/05/2016,4:41 PM

## 2016-11-05 NOTE — Consult Note (Signed)
[facility]  CONSULT NOTE    Date: 11/05/2016                  Patient Name:  Erin Hayes  MRN: [id number]  DOB: 11/21/1965  Age / Sex: 51 y.o., female         PCP: Patient, No Pcp Per                 Service Requesting Consult: Dr. Posey Pronto                 Reason for Consult: Hypokalemia            History of Present Illness: Erin Hayes is a 51 y.o. black female with diabetes mellitus type II, hypertension, GERD, history of nephrolithiasis, who was admitted to [facility] on 10/31/2016 for Hypokalemia [E87.6] Chronic diarrhea [K52.9] Elevated troponin I level [R74.8] Generalized weakness [R53.1] Urinary tract infection without hematuria, site unspecified [N39.0] Acute renal failure, unspecified acute renal failure type ([place]) [N17.9]  Patient with persistent diarrhea. Nephrology consulted for hypokalemia. Being treated with IV K phos and IV magnesium.   Patient states her diarrhea has improved. Colonoscopy was on 7/1, pending biopsy.    Medications: Outpatient medications: Prescriptions Prior to Admission  Medication Sig Dispense Refill Last Dose  . amLODipine (NORVASC) 5 MG tablet Take 1 tablet (5 mg total) by mouth daily. 90 tablet 1 10/31/2016 at Unknown time  . aspirin 81 MG tablet Take 81 mg by mouth daily.   10/31/2016 at Unknown time  . hydrochlorothiazide (HYDRODIURIL) 25 MG tablet Take 1 tablet (25 mg total) by mouth daily. 90 tablet 1 10/31/2016 at Unknown time  . metFORMIN (GLUCOPHAGE) 500 MG tablet Take 1 tablet (500 mg total) by mouth 2 (two) times daily with a meal. 180 tablet 1 10/31/2016 at Unknown time  . insulin glargine (LANTUS) 100 UNIT/ML injection Inject 0.25 mLs (25 Units total) into the skin at bedtime. (Patient not taking: Reported on 10/31/2016) 10 mL 11 Not Taking at Unknown time  . lisinopril (PRINIVIL,ZESTRIL) 20 MG tablet Take 2 tablets (40 mg total) by mouth at bedtime. (Patient not taking: Reported on 10/31/2016) 180 tablet 3 Not  Taking at Unknown time    Current medications: Current Facility-Administered Medications  Medication Dose Route Frequency Provider Last Rate Last Dose  . 0.9 %  sodium chloride infusion   Intravenous Continuous Wilford Corner, MD 10 mL/hr at 11/05/16 1122 10 mL/hr at 11/05/16 1122  . acetaminophen (TYLENOL) tablet 650 mg  650 mg Oral Q6H PRN Lance Coon, MD       Or  . acetaminophen (TYLENOL) suppository 650 mg  650 mg Rectal Q6H PRN Lance Coon, MD      . aspirin chewable tablet 81 mg  81 mg Oral Daily Lance Coon, MD   81 mg at 11/05/16 1123  . cefUROXime (CEFTIN) tablet 500 mg  500 mg Oral BID WC Dustin Flock, MD   500 mg at 11/05/16 0848  . feeding supplement (ENSURE ENLIVE) (ENSURE ENLIVE) liquid 237 mL  237 mL Oral TID BM Manuella Ghazi, Vipul, MD   237 mL at 11/04/16 1200  . heparin injection 5,000 Units  5,000 Units Subcutaneous Driscilla Moats, MD   5,000 Units at 11/05/16 0518  . insulin aspart (novoLOG) injection 0-5 Units  0-5 Units Subcutaneous QHS Lance Coon, MD   2 Units at 11/02/16 2146  . insulin aspart (novoLOG) injection 0-9 Units  0-9 Units Subcutaneous TID WC Willis,  Shanon Brow, MD   3 Units at 11/05/16 1233  . loperamide (IMODIUM) capsule 4 mg  4 mg Oral Q6H Dustin Flock, MD   4 mg at 11/05/16 1123  . multivitamin with minerals tablet 1 tablet  1 tablet Oral Daily Max Sane, MD   1 tablet at 11/04/16 1458  . ondansetron (ZOFRAN) tablet 4 mg  4 mg Oral Q6H PRN Lance Coon, MD       Or  . ondansetron [facility]) injection 4 mg  4 mg Intravenous Q6H PRN Lance Coon, MD      . potassium phosphate 30 mmol in dextrose 5 % 500 mL infusion  30 mmol Intravenous Once Dustin Flock, MD 85 mL/hr at 11/05/16 1123 30 mmol at 11/05/16 1123  . zinc oxide (BALMEX) 11.3 % cream   Topical BID Lance Coon, MD          Allergies: No Known Allergies    Past Medical History: Past Medical History:  Diagnosis Date  . AKI (acute kidney injury) ([place]) 10/31/2016  . Diabetes  ([place])   . GERD (gastroesophageal reflux disease)   . Hypertension   . Non-smoker      Past Surgical History: Past Surgical History:  Procedure Laterality Date  . FLEXIBLE SIGMOIDOSCOPY N/A 11/04/2016   Procedure: FLEXIBLE SIGMOIDOSCOPY;  Surgeon: Wilford Corner, MD;  Location[facility] ENDOSCOPY;  Service: Endoscopy;  Laterality: N/A;  . LITHOTRIPSY       Family History: Family History  Problem Relation Age of Onset  . Hypertension Father      Social History: Social History   Social History  . Marital status: Single    Spouse name: N/A  . Number of children: N/A  . Years of education: N/A   Occupational History  . Not on file.   Social History Main Topics  . Smoking status: Never Smoker  . Smokeless tobacco: Never Used  . Alcohol use No  . Drug use: No  . Sexual activity: Not on file   Other Topics Concern  . Not on file   Social History Narrative  . No narrative on file     Review of Systems: Review of Systems  Constitutional: Negative.  Negative for chills, diaphoresis, fever, malaise/fatigue and weight loss.  HENT: Negative.  Negative for congestion, ear discharge, ear pain, hearing loss, nosebleeds, sinus pain, sore throat and tinnitus.   Eyes: Negative.  Negative for blurred vision, double vision, photophobia, pain, discharge and redness.  Respiratory: Negative.  Negative for cough, hemoptysis, sputum production, shortness of breath, wheezing and stridor.   Cardiovascular: Negative.  Negative for chest pain, palpitations, orthopnea, claudication, leg swelling and PND.  Gastrointestinal: Positive for diarrhea. Negative for abdominal pain, blood in stool, constipation, heartburn, melena, nausea and vomiting.  Skin: Negative.  Negative for itching and rash.  Neurological: Negative for weakness.    Vital Signs: Blood pressure 92/71, pulse 75, temperature 99.1 F (37.3 C), temperature source Oral, resp. rate 20, height 4\' 11"  (1.499 m), weight 33.4 kg (73  lb 11.2 oz), SpO2 99 %.  Weight trends: Filed Weights   10/31/16 1945 11/01/16 1057 11/02/16 0433  Weight: 29.3 kg (64 lb 9.6 oz) 33.3 kg (73 lb 6.4 oz) 33.4 kg (73 lb 11.2 oz)    Physical Exam: General: NAD,   Head: Normocephalic, atraumatic. Moist oral mucosal membranes  Eyes: Anicteric, PERRL  Neck: Supple, trachea midline  Lungs:  Clear to auscultation  Heart: Regular rate and rhythm  Abdomen:  Soft, nontender,   Extremities: no  peripheral edema.  Neurologic: Nonfocal, moving all four extremities  Skin: No lesions        Lab results: Basic Metabolic Panel:  Recent Labs Lab 11/01/16 1410  11/02/16 0416  11/03/16 0527 11/03/16 1750 11/04/16 0541 11/05/16 0450  NA  --   --  136  --  135  --  135 133*  K  --   --  <2.0*  < > 2.7* 2.8* 3.3* 2.2*  CL  --   --  110  --  112*  --  113* 113*  CO2  --   --  19*  --  17*  --  15* 13*  GLUCOSE  --   --  112*  --  117*  --  116* 100*  BUN  --   --  32*  --  24*  --  18 16  CREATININE  --   --  1.40*  --  1.16*  --  0.97 0.95  CALCIUM  --   --  8.1*  --  8.0*  --  8.2* 7.6*  MG 2.2  --  2.0  --  2.1 2.0  --  1.7  PHOS  --   < > 2.4*  --  1.5* 1.4* 2.1* 2.8  < > = values in this interval not displayed.  Liver Function Tests:  Recent Labs Lab 10/31/16 1949  AST 57*  ALT 35  ALKPHOS 86  BILITOT 1.7*  PROT 7.4  ALBUMIN 4.1    Recent Labs Lab 10/31/16 1949  LIPASE 33   No results for input(s): AMMONIA in the last 168 hours.  CBC:  Recent Labs Lab 10/31/16 1949 11/01/16 0219 11/02/16 0416 11/03/16 0527  WBC 13.0* 13.7* 8.7 9.4  NEUTROABS 9.8*  --   --   --   HGB 15.6 14.3 13.0 13.4  HCT 46.1 41.6 38.2 39.3  MCV 82.4 82.1 82.7 82.1  PLT 366 354 323 300    Cardiac Enzymes:  Recent Labs Lab 10/31/16 1949 11/01/16 0219 11/01/16 1003 11/01/16 1410  TROPONINI 0.05* 0.07* 0.04* 0.05*    BNP: Invalid input(s): POCBNP  CBG:  Recent Labs Lab 11/04/16 1152 11/04/16 1633 11/04/16 2030  11/05/16 0841 11/05/16 1151  GLUCAP 169* 159* 109* 184* 228*    Microbiology: Results for orders placed or performed during the hospital encounter of 10/31/16  Urine Culture     Status: Abnormal   Collection Time: 10/31/16  7:49 PM  Result Value Ref Range Status   Specimen Description URINE, CLEAN CATCH  Final   Special Requests Normal  Final   Culture >=100,000 COLONIES/mL ESCHERICHIA COLI (A)  Final   Report Status 11/03/2016 FINAL  Final   Organism ID, Bacteria ESCHERICHIA COLI (A)  Final      Susceptibility   Escherichia coli - MIC*    AMPICILLIN 8 SENSITIVE Sensitive     CEFAZOLIN <=4 SENSITIVE Sensitive     CEFTRIAXONE <=1 SENSITIVE Sensitive     CIPROFLOXACIN <=0.25 SENSITIVE Sensitive     GENTAMICIN <=1 SENSITIVE Sensitive     IMIPENEM <=0.25 SENSITIVE Sensitive     NITROFURANTOIN <=16 SENSITIVE Sensitive     TRIMETH/SULFA <=20 SENSITIVE Sensitive     AMPICILLIN/SULBACTAM <=2 SENSITIVE Sensitive     PIP/TAZO <=4 SENSITIVE Sensitive     Extended ESBL NEGATIVE Sensitive     * >=100,000 COLONIES/mL ESCHERICHIA COLI  Gastrointestinal Panel by PCR , Stool     Status: None   Collection Time: 11/01/16  2:40 PM  Result Value Ref Range Status   Campylobacter species NOT DETECTED NOT DETECTED Final   Plesimonas shigelloides NOT DETECTED NOT DETECTED Final   Salmonella species NOT DETECTED NOT DETECTED Final   Yersinia enterocolitica NOT DETECTED NOT DETECTED Final   Vibrio species NOT DETECTED NOT DETECTED Final   Vibrio cholerae NOT DETECTED NOT DETECTED Final   Enteroaggregative E coli (EAEC) NOT DETECTED NOT DETECTED Final   Enteropathogenic E coli (EPEC) NOT DETECTED NOT DETECTED Final   Enterotoxigenic E coli (ETEC) NOT DETECTED NOT DETECTED Final   Shiga like toxin producing E coli (STEC) NOT DETECTED NOT DETECTED Final   Shigella/Enteroinvasive E coli (EIEC) NOT DETECTED NOT DETECTED Final   Cryptosporidium NOT DETECTED NOT DETECTED Final   Cyclospora cayetanensis  NOT DETECTED NOT DETECTED Final   Entamoeba histolytica NOT DETECTED NOT DETECTED Final   Giardia lamblia NOT DETECTED NOT DETECTED Final   Adenovirus F40/41 NOT DETECTED NOT DETECTED Final   Astrovirus NOT DETECTED NOT DETECTED Final   Norovirus GI/GII NOT DETECTED NOT DETECTED Final   Rotavirus A NOT DETECTED NOT DETECTED Final   Sapovirus (I, II, IV, and V) NOT DETECTED NOT DETECTED Final  C difficile quick scan w PCR reflex     Status: None   Collection Time: 11/01/16  2:40 PM  Result Value Ref Range Status   C Diff antigen NEGATIVE NEGATIVE Final   C Diff toxin NEGATIVE NEGATIVE Final   C Diff interpretation No C. difficile detected.  Final    Coagulation Studies: No results for input(s): LABPROT, INR in the last 72 hours.  Urinalysis: No results for input(s): COLORURINE, LABSPEC, PHURINE, GLUCOSEU, HGBUR, BILIRUBINUR, KETONESUR, PROTEINUR, UROBILINOGEN, NITRITE, LEUKOCYTESUR in the last 72 hours.  Invalid input(s): APPERANCEUR    Imaging:  No results found.   Assessment & Plan: Ms. DORIEN BESSENT is a 51 y.o. black female with diabetes mellitus type II insulin dependent, hypertension, GERD, history of nephrolithiasis, who was admitted to [facility] on 10/31/2016 for Hypokalemia [E87.6] Chronic diarrhea [K52.9] Elevated troponin I level [R74.8] Generalized weakness [R53.1] Urinary tract infection without hematuria, site unspecified [N39.0] Acute renal failure, unspecified acute renal failure type ([place]) [N17.9]   1. Acute renal failure: prerenal azotemia secondary to GI losses from diarrhea. Creatinine on admission of 2.16. Trending down to 0.95. Baseline of 0.88 on 10/02/16. Normal GFR.  - Continue IV fluids  2. Hypokalemia: chronic since 09/2015. Concern for GI losses and diuretics (hydrochlorothiazide).  - Has not improved with IV and PO replacement. Concern for malabsorption.  - Aggressive IV magnesium and potassium replacement today.   3. Hypertension: hypotensive.  Holding home blood pressure medications.   4. 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 Insulin dependent.  - hold metformin.  - Check hemoglobin A1c    LOS: [place], [place] 7/2/20183:58 PM

## 2016-11-05 NOTE — Progress Notes (Signed)
[place] at [place] NAME: Erin Hayes    MR#:  [id number]  DATE OF BIRTH:  [birth year]  SUBJECTIVE:  CHIEF COMPLAINT:   Chief Complaint  Patient presents with  . Diarrhea  . Weakness   Only had 1 loose bowel movement yesterday diarrhea is improved however patient's potassium is still very low  . REVIEW OF SYSTEMS:  Review of Systems  Constitutional: Negative for chills, fever, malaise/fatigue and weight loss.  HENT: Negative for nosebleeds and sore throat.   Eyes: Negative for blurred vision.  Respiratory: Negative for cough, shortness of breath and wheezing.   Cardiovascular: Negative for chest pain, orthopnea, leg swelling and PND.  Gastrointestinal: Positive for diarrhea. Negative for abdominal pain, constipation, heartburn, nausea and vomiting.  Genitourinary: Negative for dysuria and urgency.  Musculoskeletal: Negative for back pain.  Skin: Negative for rash.  Neurological: Negative for dizziness, speech change, focal weakness, weakness and headaches.  Endo/Heme/Allergies: Does not bruise/bleed easily.  Psychiatric/Behavioral: Negative for depression.   DRUG ALLERGIES:  No Known Allergies VITALS:  Blood pressure 92/71, pulse 75, temperature 99.1 F (37.3 C), temperature source Oral, resp. rate 20, height 4\' 11"  (1.499 m), weight 73 lb 11.2 oz (33.4 kg), SpO2 99 %. PHYSICAL EXAMINATION:  Physical Exam LABORATORY PANEL:  Female CBC  Recent Labs Lab 11/03/16 0527  WBC 9.4  HGB 13.4  HCT 39.3  PLT 300   ------------------------------------------------------------------------------------------------------------------ Chemistries   Recent Labs Lab 10/31/16 1949  11/05/16 0450  NA 137  < > 133*  K <2.0*  < > 2.2*  CL 104  < > 113*  CO2 15*  < > 13*  GLUCOSE 144*  < > 100*  BUN 41*  < > 16  CREATININE 2.16*  < > 0.95  CALCIUM 9.0  < > 7.6*  MG 2.4  < > 1.7  AST 57*  --   --   ALT 35  --   --   ALKPHOS 86  --    --   BILITOT 1.7*  --   --   < > = values in this interval not displayed. RADIOLOGY:  No results found. ASSESSMENT AND PLAN:   * AKI (acute kidney injury) ([place]) - due to her diarrhea and dehydration. Now resolved. Continue to monitor  * Severe Hypokalemia - Very low again despite replacement patient not having any diarrhea now I will have nephrology see the patient, other differentials including urinary loss with renal tubule dysfunction Cortisol level is at 8.8   * chronic Diarrhea -  Could be due to chronic illnesses IBD other type colitis - Negative stool studies and c.diff Status post sigmoidoscopy with biopsy  *  gram-negative rods UTI (urinary tract infection) - Ceftin bid  * Diabetes ([facility]) - sliding scale insulin with corresponding glucose checks    Hypertension - continue home meds     All the records are reviewed and case discussed with Care Management/Social Worker. Management plans discussed with the patient, nursing and they are in agreement.  CODE STATUS: Full Code  TOTAL TIME TAKING CARE OF THIS PATIENT: 28min   More than 50% of the time was spent in counseling/coordination of care: YES  POSSIBLE D/C IN 1-2 DAYS, DEPENDING ON CLINICAL CONDITION.  And GI eval   Dustin Flock M.D on 11/05/2016 at 2:24 PM  Between 7am to 6pm - Pager - [phone]  After 6pm go to [email or web address] - [facility]  Office  [phone]  CC: Primary care physician; Patient, No Pcp Per  Note: This dictation was prepared with Dragon dictation along with smaller phrase technology. Any transcriptional errors that result from this process are unintentional.

## 2016-11-06 LAB — RENAL FUNCTION PANEL
ALBUMIN: 2.8 g/dL — AB (ref 3.5–5.0)
Anion gap: 3 — ABNORMAL LOW (ref 5–15)
BUN: 11 mg/dL (ref 6–20)
CALCIUM: 7.5 mg/dL — AB (ref 8.9–10.3)
CO2: 20 mmol/L — ABNORMAL LOW (ref 22–32)
CREATININE: 0.88 mg/dL (ref 0.44–1.00)
Chloride: 114 mmol/L — ABNORMAL HIGH (ref 101–111)
Glucose, Bld: 137 mg/dL — ABNORMAL HIGH (ref 65–99)
Phosphorus: 3.6 mg/dL (ref 2.5–4.6)
Potassium: 2.9 mmol/L — ABNORMAL LOW (ref 3.5–5.1)
SODIUM: 137 mmol/L (ref 135–145)

## 2016-11-06 LAB — GLUCOSE, CAPILLARY
GLUCOSE-CAPILLARY: 131 mg/dL — AB (ref 65–99)
Glucose-Capillary: 190 mg/dL — ABNORMAL HIGH (ref 65–99)
Glucose-Capillary: 169 mg/dL — ABNORMAL HIGH (ref 65–99)
Glucose-Capillary: 158 mg/dL — ABNORMAL HIGH (ref 65–99)

## 2016-11-06 LAB — MAGNESIUM: MAGNESIUM: 2.9 mg/dL — AB (ref 1.7–2.4)

## 2016-11-06 LAB — POTASSIUM: POTASSIUM: 2.8 mmol/L — AB (ref 3.5–5.1)

## 2016-11-06 LAB — SURGICAL PATHOLOGY

## 2016-11-06 MED ORDER — CEFUROXIME AXETIL 500 MG PO TABS: 500.0000 mg | ORAL_TABLET | Freq: Two times a day (BID) | ORAL | 0 refills | Status: DC

## 2016-11-06 MED ORDER — POTASSIUM CHLORIDE 10 MEQ/100ML IV SOLN
10.0000 meq | INTRAVENOUS | Status: DC
  Administered 2016-11-06 (×2): 10 meq via INTRAVENOUS
  Filled 2016-11-06 (×2): qty 100

## 2016-11-06 MED ORDER — POTASSIUM CHLORIDE CRYS ER 20 MEQ PO TBCR
60.0000 meq | EXTENDED_RELEASE_TABLET | ORAL | Status: AC
  Administered 2016-11-06 (×3): 60 meq via ORAL
  Filled 2016-11-06 (×3): qty 3

## 2016-11-06 MED ORDER — POTASSIUM CHLORIDE CRYS ER 20 MEQ PO TBCR: 20.0000 meq | EXTENDED_RELEASE_TABLET | Freq: Two times a day (BID) | ORAL | 0 refills | Status: DC

## 2016-11-06 MED ORDER — POTASSIUM CHLORIDE 10 MEQ/100ML IV SOLN: 10.0000 meq | INTRAVENOUS | Status: DC

## 2016-11-06 MED ORDER — POTASSIUM CHLORIDE IN NACL 40-0.9 MEQ/L-% IV SOLN
INTRAVENOUS | Status: DC
  Administered 2016-11-06: 75 mL/h via INTRAVENOUS
  Filled 2016-11-06 (×4): qty 1000

## 2016-11-06 MED ORDER — LOPERAMIDE HCL 2 MG PO CAPS: 4.0000 mg | ORAL_CAPSULE | ORAL | 0 refills | Status: DC | PRN

## 2016-11-06 NOTE — Progress Notes (Signed)
[facility]  ROUNDING NOTE   Subjective:   K 2.9.  Patient's diarrhea has improved.   Objective:  Vital signs in last 24 hours:  Temp:  [98.3 F (36.8 C)-98.5 F (36.9 C)] 98.3 F (36.8 C) (07/03 0501) Pulse Rate:  [35-90] 90 (07/03 1219) Resp:  [18-20] 18 (07/03 0501) BP: (102-113)/(70-80) 102/70 (07/03 1219) SpO2:  [88 %-100 %] 92 % (07/03 0502)  Weight change:  Filed Weights   10/31/16 1945 11/01/16 1057 11/02/16 0433  Weight: 29.3 kg (64 lb 9.6 oz) 33.3 kg (73 lb 6.4 oz) 33.4 kg (73 lb 11.2 oz)    Intake/Output: I/O last 3 completed shifts: In: 1645.5 [P.O.:837; I.V.:366.5; IV Piggyback:442] Out: 2450 [Urine:2450]   Intake/Output this shift:  Total I/O In: 360 [P.O.:360] Out: 250 [Urine:250]  Physical Exam: General: NAD, laying in bed  Head: Normocephalic, atraumatic. Moist oral mucosal membranes  Eyes: Anicteric, PERRL  Neck: Supple, trachea midline  Lungs:  Clear to auscultation  Heart: Regular rate and rhythm  Abdomen:  Soft, nontender,   Extremities: no peripheral edema.  Neurologic: Nonfocal, moving all four extremities  Skin: No lesions       Basic Metabolic Panel:  Recent Labs Lab 11/02/16 0416  11/03/16 0527 11/03/16 1750 11/04/16 0541 11/05/16 0450 11/05/16 2044 11/06/16 0756  NA 136  --  135  --  135 133*  --  137  K <2.0*  < > 2.7* 2.8* 3.3* 2.2* 2.7* 2.9*  CL 110  --  112*  --  113* 113*  --  114*  CO2 19*  --  17*  --  15* 13*  --  20*  GLUCOSE 112*  --  117*  --  116* 100*  --  137*  BUN 32*  --  24*  --  18 16  --  11  CREATININE 1.40*  --  1.16*  --  0.97 0.95  --  0.88  CALCIUM 8.1*  --  8.0*  --  8.2* 7.6*  --  7.5*  MG 2.0  --  2.1 2.0  --  1.7  --  2.9*  PHOS 2.4*  --  1.5* 1.4* 2.1* 2.8  --  3.6  < > = values in this interval not displayed.  Liver Function Tests:  Recent Labs Lab 10/31/16 1949 11/06/16 0756  AST 57*  --   ALT 35  --   ALKPHOS 86  --   BILITOT 1.7*  --   PROT 7.4  --   ALBUMIN 4.1  2.8*    Recent Labs Lab 10/31/16 1949  LIPASE 33   No results for input(s): AMMONIA in the last 168 hours.  CBC:  Recent Labs Lab 10/31/16 1949 11/01/16 0219 11/02/16 0416 11/03/16 0527  WBC 13.0* 13.7* 8.7 9.4  NEUTROABS 9.8*  --   --   --   HGB 15.6 14.3 13.0 13.4  HCT 46.1 41.6 38.2 39.3  MCV 82.4 82.1 82.7 82.1  PLT 366 354 323 300    Cardiac Enzymes:  Recent Labs Lab 10/31/16 1949 11/01/16 0219 11/01/16 1003 11/01/16 1410  TROPONINI 0.05* 0.07* 0.04* 0.05*    BNP: Invalid input(s): POCBNP  CBG:  Recent Labs Lab 11/05/16 1151 11/05/16 1643 11/05/16 2045 11/06/16 0746 11/06/16 1215  GLUCAP 228* 178* 193* 131* 190*    Microbiology: Results for orders placed or performed during the hospital encounter of 10/31/16  Urine Culture     Status: Abnormal   Collection Time: 10/31/16  7:49 PM  Result Value Ref Range Status   Specimen Description URINE, CLEAN CATCH  Final   Special Requests Normal  Final   Culture >=100,000 COLONIES/mL ESCHERICHIA COLI (A)  Final   Report Status 11/03/2016 FINAL  Final   Organism ID, Bacteria ESCHERICHIA COLI (A)  Final      Susceptibility   Escherichia coli - MIC*    AMPICILLIN 8 SENSITIVE Sensitive     CEFAZOLIN <=4 SENSITIVE Sensitive     CEFTRIAXONE <=1 SENSITIVE Sensitive     CIPROFLOXACIN <=0.25 SENSITIVE Sensitive     GENTAMICIN <=1 SENSITIVE Sensitive     IMIPENEM <=0.25 SENSITIVE Sensitive     NITROFURANTOIN <=16 SENSITIVE Sensitive     TRIMETH/SULFA <=20 SENSITIVE Sensitive     AMPICILLIN/SULBACTAM <=2 SENSITIVE Sensitive     PIP/TAZO <=4 SENSITIVE Sensitive     Extended ESBL NEGATIVE Sensitive     * >=100,000 COLONIES/mL ESCHERICHIA COLI  Gastrointestinal Panel by PCR , Stool     Status: None   Collection Time: 11/01/16  2:40 PM  Result Value Ref Range Status   Campylobacter species NOT DETECTED NOT DETECTED Final   Plesimonas shigelloides NOT DETECTED NOT DETECTED Final   Salmonella species NOT  DETECTED NOT DETECTED Final   Yersinia enterocolitica NOT DETECTED NOT DETECTED Final   Vibrio species NOT DETECTED NOT DETECTED Final   Vibrio cholerae NOT DETECTED NOT DETECTED Final   Enteroaggregative E coli (EAEC) NOT DETECTED NOT DETECTED Final   Enteropathogenic E coli (EPEC) NOT DETECTED NOT DETECTED Final   Enterotoxigenic E coli (ETEC) NOT DETECTED NOT DETECTED Final   Shiga like toxin producing E coli (STEC) NOT DETECTED NOT DETECTED Final   Shigella/Enteroinvasive E coli (EIEC) NOT DETECTED NOT DETECTED Final   Cryptosporidium NOT DETECTED NOT DETECTED Final   Cyclospora cayetanensis NOT DETECTED NOT DETECTED Final   Entamoeba histolytica NOT DETECTED NOT DETECTED Final   Giardia lamblia NOT DETECTED NOT DETECTED Final   Adenovirus F40/41 NOT DETECTED NOT DETECTED Final   Astrovirus NOT DETECTED NOT DETECTED Final   Norovirus GI/GII NOT DETECTED NOT DETECTED Final   Rotavirus A NOT DETECTED NOT DETECTED Final   Sapovirus (I, II, IV, and V) NOT DETECTED NOT DETECTED Final  C difficile quick scan w PCR reflex     Status: None   Collection Time: 11/01/16  2:40 PM  Result Value Ref Range Status   C Diff antigen NEGATIVE NEGATIVE Final   C Diff toxin NEGATIVE NEGATIVE Final   C Diff interpretation No C. difficile detected.  Final    Coagulation Studies: No results for input(s): LABPROT, INR in the last 72 hours.  Urinalysis: No results for input(s): COLORURINE, LABSPEC, PHURINE, GLUCOSEU, HGBUR, BILIRUBINUR, KETONESUR, PROTEINUR, UROBILINOGEN, NITRITE, LEUKOCYTESUR in the last 72 hours.  Invalid input(s): APPERANCEUR    Imaging: No results found.   Medications:   . 0.9 % NaCl with KCl 40 mEq / L 75 mL/hr (11/06/16 1041)   . aspirin  81 mg Oral Daily  . cefUROXime  500 mg Oral BID WC  . feeding supplement (ENSURE ENLIVE)  237 mL Oral TID BM  . heparin  5,000 Units Subcutaneous Q8H  . insulin aspart  0-5 Units Subcutaneous QHS  . insulin aspart  0-9 Units  Subcutaneous TID WC  . loperamide  4 mg Oral Q6H  . multivitamin with minerals  1 tablet Oral Daily  . potassium chloride  60 mEq Oral Q2H  . zinc oxide   Topical BID   acetaminophen **OR**  acetaminophen, ondansetron **OR** ondansetron (ZOFRAN) IV  Assessment/ Plan:  Ms. SHERRAL DIROCCO is a 51 y.o. black female  with diabetes mellitus type II insulin dependent, hypertension, GERD, history of nephrolithiasis, who was admitted to [facility] on 10/31/2016 for Hypokalemia [E87.6] Chronic diarrhea [K52.9] Elevated troponin I level [R74.8] Generalized weakness [R53.1] Urinary tract infection without hematuria, site unspecified [N39.0] Acute renal failure, unspecified acute renal failure type ([place]) [N17.9]   1. Acute renal failure: prerenal azotemia secondary to GI losses from diarrhea. Creatinine on admission of 2.16.  Back to baseline with IV fluids.   2. Hypokalemia: chronic since 09/2015. Concern for GI losses and diuretics (hydrochlorothiazide).  - Has not improved with much IV and PO replacement. Concern for malabsorption.  - Aggressive magnesium and potassium replacement .   3. Hypertension: hypotensive. Holding home blood pressure medications.   4. 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 Insulin dependent.  - hold metformin.  - Pending hemoglobin A1c    LOS: [place], Khrystal Jeanmarie 7/3/20182:48 PM

## 2016-11-06 NOTE — Care Management (Signed)
Patient admitted with AKI and hypokalemia.  PCP [place].  Patient to discharge on Ceftin, and Kdur.  RNCm spoke with Medication Management.  Medication Management states that Patient used to receive her medication from them, however the financial packet was not completed and case was closed. Patient was provided application to [facility].  Patient was also provided coupons for both prescriptions from [email or web address].  Patient also has the option to pick up her Kdur from [facility], and use the coupon for Ceftin at [facility].  RNCM signing off.

## 2016-11-06 NOTE — Progress Notes (Signed)
Nutrition Follow-up  DOCUMENTATION CODES:   Severe malnutrition in context of chronic illness  INTERVENTION:  Encouraged adequate intake of calories and protein at meals. Discussed patient's increased needs in setting of possible malabsorption as indicated by chronic diarrhea, severe weight loss.   Continue Ensure Enlive po TID, each supplement provides 350 kcal and 20 grams of protein. Encouraged her to continue drinking Ensure at home between meals to help meet calorie/protein needs and prevent further unintentional weight loss.  Continue daily multivitamin with minerals. Encouraged patient to take MVI at home.  Provided "High Potassium Food List" from the Academy of Nutrition [employer] Dietetics. Encouraged patient to add high-potassium foods to her daily intake.  NUTRITION DIAGNOSIS:   Malnutrition related to chronic illness (diarrhea x 3-4 months, DM) as evidenced by percent weight loss, severe depletion of muscle mass, severe depletion of body fat.  Ongoing.  GOAL:   Patient will meet greater than or equal to 90% of their needs  Met with calories, progressing with protein.  MONITOR:   PO intake, Supplement acceptance, Labs, Weight trends  REASON FOR ASSESSMENT:   Malnutrition Screening Tool    ASSESSMENT:   51 y.o. female who presents with watery diarrhea for the past 3-4 months. She states she has a few episodes of diarrhea each day. Admitted fir significantly low potassium, UTI, and AKI.  -Pt being followed by GI for chronic watery diarrhea. GI pathogen panel negative and C. Diff negative. Pt s/p flexible sigmoidoscopy on 7/1, which found non-specific colitis with diffuse colonic edema, semi-liquid stool scattered throughout examined colon. No psuedomembranes seen. Pending biopsy. -Per Nephrology note concern for malabsorption.  Spoke with patient at bedside. She reports her appetite is "too good" and that she is eating well. She has been finishing 100% of most of her  meals. Patient reports she had one Boost Breeze yesterday instead of the Ensure because she thought Ensure had milk in it. Discussed that Ensure is lactose-free and has more protein than Boost Breeze, so will help her meet her protein needs better. Patient reports she has not had any diarrhea today, and yesterday only had one bowel movement that was not diarrhea. Denies any N/V or abdominal pain.  Meal Completion: 95-100% In the past 24 hrs patient has had approximately 1623 kcal (>100% estimated kcal needs) and 44 grams of protein (66% minimum estimated protein needs).   Medications reviewed and include: cefuroxime, Novolog 0-9 units TID, Novolog 0-5 units QHS, Imodium 4 mg Q6hrs, MVI daily, potassium chloride 60 mEq Q2hrs, NS with KCl 40 mEq/L @ 75 ml/hr.  Labs reviewed: CBG 131-228 past 24 hrs, Potassium 2.9 (trending up from 2.7 last night and 2.2 yesterday morning), Chloride 114, CO2 20, Magnesium 2.9.  I/O: 9 occurrences stool output 6/28-6/29, 13 occurrences stool output on 6/29-6/30, 11 occurrences stool output on 6/30-7/1  Weight trend: 33.4 kg on 6/29 - stable from 6/28 and +4.1 kg from admission likely due to rehydration. No subsequent weights to trend  Discussed with RN.  Diet Order:  DIET SOFT Room service appropriate? Yes; Fluid consistency: Thin  Skin:  Reviewed, no issues  Last BM:  11/05/2016 - type 6  Height:   Ht Readings from Last 1 Encounters:  10/31/16 '4\' 11"'$  (1.499 m)    Weight:   Wt Readings from Last 1 Encounters:  11/02/16 73 lb 11.2 oz (33.4 kg)    Ideal Body Weight:  44.5 kg  BMI:  Body mass index is 14.89 kg/m.  Estimated Nutritional Needs:  Kcal:  1200-1300kcal/day   Protein:  67-73g/day  Fluid:  >1.2L/day   EDUCATION NEEDS:   Education needs addressed  Willey Blade, MS, RD, LDN Pager: [phone] After Hours Pager: [phone]

## 2016-11-06 NOTE — Discharge Instructions (Signed)
[facility] [place] at [facility] ° °DIET:  °Diabetic diet ° °DISCHARGE CONDITION:  °Stable ° °ACTIVITY:  °Activity as tolerated ° °OXYGEN:  °Home Oxygen: No. °  °Oxygen Delivery: room air ° °DISCHARGE LOCATION:  °home  ° ° °ADDITIONAL DISCHARGE INSTRUCTION: ° ° °If you experience worsening of your admission symptoms, develop shortness of breath, life threatening emergency, suicidal or homicidal thoughts you must seek medical attention immediately by calling 911 or calling your MD immediately  if symptoms less severe. ° °You Must read complete instructions/literature along with all the possible adverse reactions/side effects for all the Medicines you take and that have been prescribed to you. Take any new Medicines after you have completely understood and accpet all the possible adverse reactions/side effects.  ° °Please note ° °You were cared for by a hospitalist during your hospital stay. If you have any questions about your discharge medications or the care you received while you were in the hospital after you are discharged, you can call the unit and asked to speak with the hospitalist on call if the hospitalist that took care of you is not available. Once you are discharged, your primary care physician will handle any further medical issues. Please note that NO REFILLS for any discharge medications will be authorized once you are discharged, as it is imperative that you return to your primary care physician (or establish a relationship with a primary care physician if you do not have one) for your aftercare needs so that they can reassess your need for medications and monitor your lab values. ° ° °

## 2016-11-06 NOTE — Progress Notes (Addendum)
[place] for electrolyte management  Indication: severe hypokalemia   Pharmacy consulted for electrolyte management for 51 yo female admitted with diarrhea being treated for hypokalemia.  Patient ordered potassium 75mEq PO Q2hr x 3 and Potassium 14mEq IV x 4.   Plan:  Will obtain BMP with am labs.   No Known Allergies  Patient Measurements: Height: 4\' 11"  (149.9 cm) Weight: 73 lb 11.2 oz (33.4 kg) IBW/kg (Calculated) : 43.2   Vital Signs: Temp: 99.5 F (37.5 C) (07/03 2012) Temp Source: Oral (07/03 2012) BP: 109/77 (07/03 2012) Pulse Rate: 96 (07/03 2012) Intake/Output from previous day: 07/02 0701 - 07/03 0700 In: 1515.5 [P.O.:837; I.V.:236.5; IV Piggyback:442] Out: 2175 [Urine:2175] Intake/Output from this shift: No intake/output data recorded.  Labs:  Recent Labs  11/04/16 0541 11/05/16 0450 11/06/16 0756  CREATININE 0.97 0.95 0.88  MG  --  1.7 2.9*  PHOS 2.1* 2.8 3.6  ALBUMIN  --   --  2.8*   Estimated Creatinine Clearance: 39.4 mL/min (by C-G formula based on SCr of 0.88 mg/dL).    Medical History: Past Medical History:  Diagnosis Date  . AKI (acute kidney injury) ([place]) 10/31/2016  . Diabetes ([place])   . GERD (gastroesophageal reflux disease)   . Hypertension   . Non-smoker     Pharmacy will continue to monitor and adjust per consult.   Simpson,Michael L 11/06/2016,9:43 PM

## 2016-11-06 NOTE — Discharge Summary (Signed)
Erin Hayes at [facility], 51 y.o., DOB 02/01/1966, MRN [id number]. Admission date: 10/31/2016 Discharge Date 11/06/2016 Primary MD Patient, No Pcp Per Admitting Physician Lance Coon, MD  Admission Diagnosis  Hypokalemia [E87.6] Chronic diarrhea [K52.9] Elevated troponin I level [R74.8] Generalized weakness [R53.1] Urinary tract infection without hematuria, site unspecified [N39.0] Acute renal failure, unspecified acute renal failure type ([place]) [N17.9]  Discharge Diagnosis   Principal Problem:   AKI (acute kidney injury) ([place])  Diarrhea with the lightest Severe electrolyte imbalances including hypokalemia and hypomagnesemia   Diabetes ([place])   Hypertension   Hypokalemia   Diarrhea   UTI (urinary tract infection)   Protein-calorie malnutrition, severe          Hospital Course   Erin Hayes  is a 51 y.o. female who presents with What she describes as watery diarrhea for the past 3-4 months. He states she has a few episodes of diarrhea each day. Patient presented to the ED and had a CT scan which did show findings consistent with colitis. Her symptoms had been going on for long time so she has diagnosis of chronic diarrhea. Her stool studies were negative for any type of infection. Patient was given aggressive IV fluids and was seen by GI. She underwent a sigmoidoscopy with biopsies. The results are currently pending. Patient was also started on Imodium with resolution of her diarrhea. She will need outpatient follow-up with GI. In terms of her renal failure this was due to dehydration resolved with IV fluids. Patient also had severe hypokalemia which is currently being replaced. She will need to follow-up with nephrology in 2 days to recheck her potassium.           Consults  GI, nephrlogy  Significant Tests:  See full reports for all details     No results found.     Today   Subjective:   Erin Hayes  patient doing much  better denies any abdominal pain nausea or vomiting  Objective:   Blood pressure 102/70, pulse 90, temperature 98.3 F (36.8 C), temperature source Oral, resp. rate 18, height 4\' 11"  (1.499 m), weight 73 lb 11.2 oz (33.4 kg), SpO2 92 %.  .  Intake/Output Summary (Last 24 hours) at 11/06/16 1254 Last data filed at 11/06/16 1123  Gross per 24 hour  Intake           1535.5 ml  Output             1700 ml  Net           -164.5 ml    Exam VITAL SIGNS: Blood pressure 102/70, pulse 90, temperature 98.3 F (36.8 C), temperature source Oral, resp. rate 18, height 4\' 11"  (1.499 m), weight 73 lb 11.2 oz (33.4 kg), SpO2 92 %.  GENERAL:  51 y.o.-year-old patient lying in the bed with no acute distress.  EYES: Pupils equal, round, reactive to light and accommodation. No scleral icterus. Extraocular muscles intact.  HEENT: Head atraumatic, normocephalic. Oropharynx and nasopharynx clear.  NECK:  Supple, no jugular venous distention. No thyroid enlargement, no tenderness.  LUNGS: Normal breath sounds bilaterally, no wheezing, rales,rhonchi or crepitation. No use of accessory muscles of respiration.  CARDIOVASCULAR: S1, S2 normal. No murmurs, rubs, or gallops.  ABDOMEN: Soft, nontender, nondistended. Bowel sounds present. No organomegaly or mass.  EXTREMITIES: No pedal edema, cyanosis, or clubbing.  NEUROLOGIC: Cranial nerves II through XII are intact. Muscle strength 5/5 in all extremities. Sensation intact. Gait  not checked.  PSYCHIATRIC: The patient is alert and oriented x 3.  SKIN: No obvious rash, lesion, or ulcer.   Data Review     CBC w Diff:  Lab Results  Component Value Date   WBC 9.4 11/03/2016   HGB 13.4 11/03/2016   HGB 16.7 (H) 04/25/2013   HCT 39.3 11/03/2016   HCT 51.2 (H) 04/25/2013   PLT 300 11/03/2016   PLT 373 04/25/2013   LYMPHOPCT 19 10/31/2016   MONOPCT 5 10/31/2016   EOSPCT 0 10/31/2016   BASOPCT 1 10/31/2016   CMP:  Lab Results  Component Value Date   NA 137  11/06/2016   NA 129 (L) 04/25/2013   K 2.9 (L) 11/06/2016   K 5.7 (H) 04/25/2013   CL 114 (H) 11/06/2016   CL 97 (L) 04/25/2013   CO2 20 (L) 11/06/2016   CO2 12 (L) 04/25/2013   BUN 11 11/06/2016   BUN 17 04/25/2013   CREATININE 0.88 11/06/2016   CREATININE 0.87 04/25/2013   PROT 7.4 10/31/2016   PROT 8.6 (H) 04/25/2013   ALBUMIN 2.8 (L) 11/06/2016   ALBUMIN 3.9 04/25/2013   BILITOT 1.7 (H) 10/31/2016   BILITOT 0.8 04/25/2013   ALKPHOS 86 10/31/2016   ALKPHOS 106 04/25/2013   AST 57 (H) 10/31/2016   AST 35 04/25/2013   ALT 35 10/31/2016   ALT 29 04/25/2013  .  Micro Results Recent Results (from the past 240 hour(s))  Urine Culture     Status: Abnormal   Collection Time: 10/31/16  7:49 PM  Result Value Ref Range Status   Specimen Description URINE, CLEAN CATCH  Final   Special Requests Normal  Final   Culture >=100,000 COLONIES/mL ESCHERICHIA COLI (A)  Final   Report Status 11/03/2016 FINAL  Final   Organism ID, Bacteria ESCHERICHIA COLI (A)  Final      Susceptibility   Escherichia coli - MIC*    AMPICILLIN 8 SENSITIVE Sensitive     CEFAZOLIN <=4 SENSITIVE Sensitive     CEFTRIAXONE <=1 SENSITIVE Sensitive     CIPROFLOXACIN <=0.25 SENSITIVE Sensitive     GENTAMICIN <=1 SENSITIVE Sensitive     IMIPENEM <=0.25 SENSITIVE Sensitive     NITROFURANTOIN <=16 SENSITIVE Sensitive     TRIMETH/SULFA <=20 SENSITIVE Sensitive     AMPICILLIN/SULBACTAM <=2 SENSITIVE Sensitive     PIP/TAZO <=4 SENSITIVE Sensitive     Extended ESBL NEGATIVE Sensitive     * >=100,000 COLONIES/mL ESCHERICHIA COLI  Gastrointestinal Panel by PCR , Stool     Status: None   Collection Time: 11/01/16  2:40 PM  Result Value Ref Range Status   Campylobacter species NOT DETECTED NOT DETECTED Final   Plesimonas shigelloides NOT DETECTED NOT DETECTED Final   Salmonella species NOT DETECTED NOT DETECTED Final   Yersinia enterocolitica NOT DETECTED NOT DETECTED Final   Vibrio species NOT DETECTED NOT DETECTED  Final   Vibrio cholerae NOT DETECTED NOT DETECTED Final   Enteroaggregative E coli (EAEC) NOT DETECTED NOT DETECTED Final   Enteropathogenic E coli (EPEC) NOT DETECTED NOT DETECTED Final   Enterotoxigenic E coli (ETEC) NOT DETECTED NOT DETECTED Final   Shiga like toxin producing E coli (STEC) NOT DETECTED NOT DETECTED Final   Shigella/Enteroinvasive E coli (EIEC) NOT DETECTED NOT DETECTED Final   Cryptosporidium NOT DETECTED NOT DETECTED Final   Cyclospora cayetanensis NOT DETECTED NOT DETECTED Final   Entamoeba histolytica NOT DETECTED NOT DETECTED Final   Giardia lamblia NOT DETECTED NOT DETECTED Final  Adenovirus F40/41 NOT DETECTED NOT DETECTED Final   Astrovirus NOT DETECTED NOT DETECTED Final   Norovirus GI/GII NOT DETECTED NOT DETECTED Final   Rotavirus A NOT DETECTED NOT DETECTED Final   Sapovirus (I, II, IV, and V) NOT DETECTED NOT DETECTED Final  C difficile quick scan w PCR reflex     Status: None   Collection Time: 11/01/16  2:40 PM  Result Value Ref Range Status   C Diff antigen NEGATIVE NEGATIVE Final   C Diff toxin NEGATIVE NEGATIVE Final   C Diff interpretation No C. difficile detected.  Final        Code Status Orders        Start     Ordered   10/31/16 2200  Full code  Continuous     10/31/16 2159    Code Status History    Date Active Date Inactive Code Status Order ID Comments User Context   04/26/2013  1:40 AM 04/29/2013  9:57 PM Full Code [id number]  Guy Begin, MD Inpatient          Follow-up Information    Lavonia Dana, MD. Go on 11/08/2016.   Specialty:  Internal Medicine Why:  Thursday at 10:00am for lab check K+ Contact information: [address] [place] [address] [phone]        Jonathon Bellows, MD. Go on 12/05/2016.   Specialty:  Surgery Why:  Wednesday at 1:00pm for hospital follow-up diarrhea Contact information: [place] [address] [phone]        Lavonia Dana, MD Follow up on 11/14/2016.   Specialty:  Internal Medicine Why:  Wednesday at 10:30am for hospital follow-up Contact information: [address] [place] [address] [phone]           Discharge Medications   Allergies as of 11/06/2016   No Known Allergies     Medication List    STOP taking these medications   hydrochlorothiazide 25 MG tablet Commonly known as:  HYDRODIURIL   lisinopril 20 MG tablet Commonly known as:  PRINIVIL,ZESTRIL   metFORMIN 500 MG tablet Commonly known as:  GLUCOPHAGE     TAKE these medications   amLODipine 5 MG tablet Commonly known as:  NORVASC Take 1 tablet (5 mg total) by mouth daily.   aspirin 81 MG tablet Take 81 mg by mouth daily.   cefUROXime 500 MG tablet Commonly known as:  CEFTIN Take 1 tablet (500 mg total) by mouth 2 (two) times daily with a meal.   insulin glargine 100 UNIT/ML injection Commonly known as:  LANTUS Inject 0.25 mLs (25 Units total) into the skin at bedtime.   loperamide 2 MG capsule Commonly known as:  IMODIUM Take 2 capsules (4 mg total) by mouth as needed for diarrhea or loose stools.   potassium chloride SA 20 MEQ tablet Commonly known as:  K-DUR,KLOR-CON Take 1 tablet (20 mEq total) by mouth 2 (two) times daily.          Total Time in preparing paper work, data evaluation and todays exam - 35 minutes  Dustin Flock M.D on 11/06/2016 at 12:54 PM  [facility]   Office  [phone]

## 2016-11-07 LAB — HEMOGLOBIN A1C
HEMOGLOBIN A1C: 6.9 % — AB (ref 4.8–5.6)
Mean Plasma Glucose: 151 mg/dL

## 2016-11-07 LAB — BASIC METABOLIC PANEL
ANION GAP: 5 (ref 5–15)
BUN: 8 mg/dL (ref 6–20)
CALCIUM: 8 mg/dL — AB (ref 8.9–10.3)
CO2: 19 mmol/L — AB (ref 22–32)
Chloride: 115 mmol/L — ABNORMAL HIGH (ref 101–111)
Creatinine, Ser: 0.8 mg/dL (ref 0.44–1.00)
GFR calc Af Amer: >60 mL/min (ref >60)
GFR calc non Af Amer: >60 mL/min (ref >60)
Glucose, Bld: 147 mg/dL — ABNORMAL HIGH (ref 65–99)
Potassium: 4.3 mmol/L (ref 3.5–5.1)
SODIUM: 139 mmol/L (ref 135–145)

## 2016-11-07 LAB — GLUCOSE, CAPILLARY
GLUCOSE-CAPILLARY: 152 mg/dL — AB (ref 65–99)
GLUCOSE-CAPILLARY: 220 mg/dL — AB (ref 65–99)

## 2016-11-07 MED ORDER — POTASSIUM CHLORIDE 20 MEQ PO PACK
40.0000 meq | PACK | ORAL | Status: DC
  Administered 2016-11-07 (×2): 40 meq via ORAL
  Filled 2016-11-07 (×2): qty 2

## 2016-11-07 NOTE — Progress Notes (Signed)
Having to run potassium bags slowly as pt is c/o burning. Running ysited to ns at 20. New iv to hang fluids as not able to run with potassium bag because fluids have pot in it and pt c/o burning. Cont to monitor.

## 2016-11-07 NOTE — Progress Notes (Signed)
Patient discharge teaching given, including activity, diet, follow-up appoints, and medications. Patient verbalized understanding of all discharge instructions. IV access was d/c'd. Vitals are stable. Skin is intact except as charted in most recent assessments. Pt to be escorted out by NT, to be driven home.  Nikolette Reindl [employer]

## 2016-11-07 NOTE — Progress Notes (Signed)
[facility]  ROUNDING NOTE   Subjective:   K 4.3 Walking with walker today  Objective:  Vital signs in last 24 hours:  Temp:  [99.2 F (37.3 C)-99.5 F (37.5 C)] 99.2 F (37.3 C) (07/04 0458) Pulse Rate:  [81-96] 81 (07/04 0458) Resp:  [18-20] 18 (07/04 0458) BP: (102-109)/(70-77) 106/73 (07/04 0458) SpO2:  [100 %] 100 % (07/04 0458)  Weight change:  Filed Weights   10/31/16 1945 11/01/16 1057 11/02/16 0433  Weight: 29.3 kg (64 lb 9.6 oz) 33.3 kg (73 lb 6.4 oz) 33.4 kg (73 lb 11.2 oz)    Intake/Output: I/O last 3 completed shifts: In: 1939 [P.O.:1312; I.V.:485; IV Piggyback:142] Out: 1400 [Urine:1400]   Intake/Output this shift:  Total I/O In: 600 [P.O.:600] Out: 200 [Urine:200]  Physical Exam: General: NAD, laying in bed  Head: Normocephalic, atraumatic. Moist oral mucosal membranes  Eyes: Anicteric, PERRL  Neck: Supple, trachea midline  Lungs:  Clear to auscultation  Heart: Regular rate and rhythm  Abdomen:  Soft, nontender,   Extremities: no peripheral edema.  Neurologic: Nonfocal, moving all four extremities  Skin: No lesions       Basic Metabolic Panel:  Recent Labs Lab 11/02/16 0416  11/03/16 0527 11/03/16 1750 11/04/16 0541 11/05/16 0450 11/05/16 2044 11/06/16 0756 11/06/16 1438 11/07/16 0459  NA 136  --  135  --  135 133*  --  137  --  139  K <2.0*  < > 2.7* 2.8* 3.3* 2.2* 2.7* 2.9* 2.8* 4.3  CL 110  --  112*  --  113* 113*  --  114*  --  115*  CO2 19*  --  17*  --  15* 13*  --  20*  --  19*  GLUCOSE 112*  --  117*  --  116* 100*  --  137*  --  147*  BUN 32*  --  24*  --  18 16  --  11  --  8  CREATININE 1.40*  --  1.16*  --  0.97 0.95  --  0.88  --  0.80  CALCIUM 8.1*  --  8.0*  --  8.2* 7.6*  --  7.5*  --  8.0*  MG 2.0  --  2.1 2.0  --  1.7  --  2.9*  --   --   PHOS 2.4*  --  1.5* 1.4* 2.1* 2.8  --  3.6  --   --   < > = values in this interval not displayed.  Liver Function Tests:  Recent Labs Lab 10/31/16 1949  11/06/16 0756  AST 57*  --   ALT 35  --   ALKPHOS 86  --   BILITOT 1.7*  --   PROT 7.4  --   ALBUMIN 4.1 2.8*    Recent Labs Lab 10/31/16 1949  LIPASE 33   No results for input(s): AMMONIA in the last 168 hours.  CBC:  Recent Labs Lab 10/31/16 1949 11/01/16 0219 11/02/16 0416 11/03/16 0527  WBC 13.0* 13.7* 8.7 9.4  NEUTROABS 9.8*  --   --   --   HGB 15.6 14.3 13.0 13.4  HCT 46.1 41.6 38.2 39.3  MCV 82.4 82.1 82.7 82.1  PLT 366 354 323 300    Cardiac Enzymes:  Recent Labs Lab 10/31/16 1949 11/01/16 0219 11/01/16 1003 11/01/16 1410  TROPONINI 0.05* 0.07* 0.04* 0.05*    BNP: Invalid input(s): POCBNP  CBG:  Recent Labs Lab 11/06/16 0746 11/06/16 1215 11/06/16 1641 11/06/16  2127 11/07/16 0757  GLUCAP 131* 190* 169* 158* 152*    Microbiology: Results for orders placed or performed during the hospital encounter of 10/31/16  Urine Culture     Status: Abnormal   Collection Time: 10/31/16  7:49 PM  Result Value Ref Range Status   Specimen Description URINE, CLEAN CATCH  Final   Special Requests Normal  Final   Culture >=100,000 COLONIES/mL ESCHERICHIA COLI (A)  Final   Report Status 11/03/2016 FINAL  Final   Organism ID, Bacteria ESCHERICHIA COLI (A)  Final      Susceptibility   Escherichia coli - MIC*    AMPICILLIN 8 SENSITIVE Sensitive     CEFAZOLIN <=4 SENSITIVE Sensitive     CEFTRIAXONE <=1 SENSITIVE Sensitive     CIPROFLOXACIN <=0.25 SENSITIVE Sensitive     GENTAMICIN <=1 SENSITIVE Sensitive     IMIPENEM <=0.25 SENSITIVE Sensitive     NITROFURANTOIN <=16 SENSITIVE Sensitive     TRIMETH/SULFA <=20 SENSITIVE Sensitive     AMPICILLIN/SULBACTAM <=2 SENSITIVE Sensitive     PIP/TAZO <=4 SENSITIVE Sensitive     Extended ESBL NEGATIVE Sensitive     * >=100,000 COLONIES/mL ESCHERICHIA COLI  Gastrointestinal Panel by PCR , Stool     Status: None   Collection Time: 11/01/16  2:40 PM  Result Value Ref Range Status   Campylobacter species NOT  DETECTED NOT DETECTED Final   Plesimonas shigelloides NOT DETECTED NOT DETECTED Final   Salmonella species NOT DETECTED NOT DETECTED Final   Yersinia enterocolitica NOT DETECTED NOT DETECTED Final   Vibrio species NOT DETECTED NOT DETECTED Final   Vibrio cholerae NOT DETECTED NOT DETECTED Final   Enteroaggregative E coli (EAEC) NOT DETECTED NOT DETECTED Final   Enteropathogenic E coli (EPEC) NOT DETECTED NOT DETECTED Final   Enterotoxigenic E coli (ETEC) NOT DETECTED NOT DETECTED Final   Shiga like toxin producing E coli (STEC) NOT DETECTED NOT DETECTED Final   Shigella/Enteroinvasive E coli (EIEC) NOT DETECTED NOT DETECTED Final   Cryptosporidium NOT DETECTED NOT DETECTED Final   Cyclospora cayetanensis NOT DETECTED NOT DETECTED Final   Entamoeba histolytica NOT DETECTED NOT DETECTED Final   Giardia lamblia NOT DETECTED NOT DETECTED Final   Adenovirus F40/41 NOT DETECTED NOT DETECTED Final   Astrovirus NOT DETECTED NOT DETECTED Final   Norovirus GI/GII NOT DETECTED NOT DETECTED Final   Rotavirus A NOT DETECTED NOT DETECTED Final   Sapovirus (I, II, IV, and V) NOT DETECTED NOT DETECTED Final  C difficile quick scan w PCR reflex     Status: None   Collection Time: 11/01/16  2:40 PM  Result Value Ref Range Status   C Diff antigen NEGATIVE NEGATIVE Final   C Diff toxin NEGATIVE NEGATIVE Final   C Diff interpretation No C. difficile detected.  Final    Coagulation Studies: No results for input(s): LABPROT, INR in the last 72 hours.  Urinalysis: No results for input(s): COLORURINE, LABSPEC, PHURINE, GLUCOSEU, HGBUR, BILIRUBINUR, KETONESUR, PROTEINUR, UROBILINOGEN, NITRITE, LEUKOCYTESUR in the last 72 hours.  Invalid input(s): APPERANCEUR    Imaging: No results found.   Medications:    . aspirin  81 mg Oral Daily  . cefUROXime  500 mg Oral BID WC  . feeding supplement (ENSURE ENLIVE)  237 mL Oral TID BM  . heparin  5,000 Units Subcutaneous Q8H  . insulin aspart  0-5 Units  Subcutaneous QHS  . insulin aspart  0-9 Units Subcutaneous TID WC  . loperamide  4 mg Oral Q6H  .  multivitamin with minerals  1 tablet Oral Daily  . zinc oxide   Topical BID   acetaminophen **OR** acetaminophen, ondansetron **OR** ondansetron (ZOFRAN) IV  Assessment/ Plan:  Ms. Erin Hayes is a 51 y.o. black female  with diabetes mellitus type II insulin dependent, hypertension, GERD, history of nephrolithiasis, who was admitted to [facility] on 10/31/2016 for Hypokalemia [E87.6] Chronic diarrhea [K52.9] Elevated troponin I level [R74.8] Generalized weakness [R53.1] Urinary tract infection without hematuria, site unspecified [N39.0] Acute renal failure, unspecified acute renal failure type ([place]) [N17.9]   1. Acute renal failure: prerenal azotemia secondary to GI losses from diarrhea. Creatinine on admission of 2.16.  Back to baseline with IV fluids and PO intake.   2. Hypokalemia: chronic since 09/2015. Concern for GI losses and diuretics (hydrochlorothiazide).  - Has improved with IV and PO replacement. Concern for malabsorption.  - PO magnesium and potassium replacement .   3. Hypertension: hypotensive. Holding home blood pressure medications.   4. 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 Insulin dependent.  - hold metformin.  - hemoglobin A1c 6.9%   LOS: [place], Erin Hayes 7/4/201812:04 PM

## 2016-11-07 NOTE — Progress Notes (Signed)
[place] [facility] [place] NAME: Erin Hayes    MR#:  [id number]  DATE OF BIRTH:  02-Feb-1966  SUBJECTIVE:  CHIEF COMPLAINT:   Chief Complaint  Patient presents with  . Diarrhea  . Weakness    Patient doing much better diarrhea resolved however potassium still low  REVIEW OF SYSTEMS:  Review of Systems  Constitutional: Negative for chills, fever, malaise/fatigue and weight loss.  HENT: Negative for nosebleeds and sore throat.   Eyes: Negative for blurred vision.  Respiratory: Negative for cough, shortness of breath and wheezing.   Cardiovascular: Negative for chest pain, orthopnea, leg swelling and PND.  Gastrointestinal: Negative for abdominal pain, constipation, diarrhea, heartburn, nausea and vomiting.  Genitourinary: Negative for dysuria and urgency.  Musculoskeletal: Negative for back pain.  Skin: Negative for rash.  Neurological: Negative for dizziness, speech change, focal weakness, weakness and headaches.  Endo/Heme/Allergies: Does not bruise/bleed easily.  Psychiatric/Behavioral: Negative for depression.   DRUG ALLERGIES:  No Known Allergies VITALS:  Blood pressure 100/72, pulse 95, temperature 99 F (37.2 C), temperature source Oral, resp. rate 20, height 4\' 11"  (1.499 m), weight 73 lb 11.2 oz (33.4 kg), SpO2 100 %. PHYSICAL EXAMINATION:  Physical Exam LABORATORY PANEL:  Female CBC  Recent Labs Lab 11/03/16 0527  WBC 9.4  HGB 13.4  HCT 39.3  PLT 300   ------------------------------------------------------------------------------------------------------------------ Chemistries   Recent Labs Lab 10/31/16 1949  11/06/16 0756  11/07/16 0459  NA 137  < > 137  --  139  K <2.0*  < > 2.9*  < > 4.3  CL 104  < > 114*  --  115*  CO2 15*  < > 20*  --  19*  GLUCOSE 144*  < > 137*  --  147*  BUN 41*  < > 11  --  8  CREATININE 2.16*  < > 0.88  --  0.80  CALCIUM 9.0  < > 7.5*  --  8.0*  MG 2.4  < > 2.9*  --   --   AST  57*  --   --   --   --   ALT 35  --   --   --   --   ALKPHOS 86  --   --   --   --   BILITOT 1.7*  --   --   --   --   < > = values in this interval not displayed. RADIOLOGY:  No results found. ASSESSMENT AND PLAN:   * AKI (acute kidney injury) ([place]) - due to her diarrhea and dehydration. Now resolved. Continue to monitor  * Severe Hypokalemia - Very low again despite replacement patient not having any diarrhea now I will have nephrology see the patient, other differentials including urinary loss with renal tubule dysfunction Cortisol level is at 8.8  * chronic Diarrhea -  Could be due to chronic illnesses IBD other type colitis - Negative stool studies and c.diff Status post sigmoidoscopy with biopsy  *  gram-negative rods UTI (urinary tract infection) - Ceftin bid  * Diabetes (HCC) - sliding scale insulin with corresponding glucose checks   *Hypertension - continue home meds     All the records are reviewed and case discussed with Care Management/Social Worker. Management plans discussed with the patient, nursing and they are in agreement.  CODE STATUS: Full Code  TOTAL TIME TAKING CARE OF THIS PATIENT: 61min   More than 50% of the time was spent  in counseling/coordination of care: YES  POSSIBLE D/C IN 1-2 DAYS, DEPENDING ON CLINICAL CONDITION.  And GI eval   Dustin Flock M.D on 11/07/2016 at 12:52 PM  Between 7am to 6pm - Pager - [phone]  After 6pm go to [email or web address] - Proofreader  [facility]  Office  [phone]  CC: Primary care physician; Patient, No Pcp Per  Note: This dictation was prepared with Dragon dictation along with smaller phrase technology. Any transcriptional errors that result from this process are unintentional.

## 2016-11-07 NOTE — Discharge Summary (Signed)
[place] at [facility], 51 y.o., DOB 1966/02/21, MRN [id number]. Admission date: 10/31/2016 Discharge Date 11/07/2016 Primary MD Patient, No Pcp Per Admitting Physician Lance Coon, MD  Admission Diagnosis  Hypokalemia [E87.6] Chronic diarrhea [K52.9] Elevated troponin I level [R74.8] Generalized weakness [R53.1] Urinary tract infection without hematuria, site unspecified [N39.0] Acute renal failure, unspecified acute renal failure type ([place]) [N17.9]  Discharge Diagnosis   Principal Problem:   AKI (acute kidney injury) ([place])  Diarrhea with the lightest Severe electrolyte imbalances including hypokalemia and hypomagnesemia   Diabetes ([place])   Hypertension   Hypokalemia   Diarrhea   UTI (urinary tract infection)   Protein-calorie malnutrition, severe          Hospital Course   Erin Hayes  is a 51 y.o. female who presents with What she describes as watery diarrhea for the past 3-4 months. He states she has a few episodes of diarrhea each day. Patient presented to the ED and had a CT scan which did show findings consistent with colitis. Her symptoms had been going on for long time so she has diagnosis of chronic diarrhea. Her stool studies were negative for any type of infection. Patient was given aggressive IV fluids and was seen by GI. She underwent a sigmoidoscopy with biopsies. The results are currently pending. Patient was also started on Imodium with resolution of her diarrhea. She will need outpatient follow-up with GI. In terms of her renal failure this was due to dehydration resolved with IV fluids. Patient also had severe hypokalemia which is currently being replaced.  Patient's potassium now is normal.         Consults  GI, nephrlogy  Significant Tests:  See full reports for all details     No results found.     Today   Subjective:   Erin Hayes  patient doing much better denies any abdominal pain nausea or  vomiting  Objective:   Blood pressure 100/72, pulse 95, temperature 99 F (37.2 C), temperature source Oral, resp. rate 20, height 4\' 11"  (1.499 m), weight 73 lb 11.2 oz (33.4 kg), SpO2 100 %.  .  Intake/Output Summary (Last 24 hours) at 11/07/16 1250 Last data filed at 11/07/16 0940  Gross per 24 hour  Intake             1693 ml  Output              900 ml  Net              793 ml    Exam VITAL SIGNS: Blood pressure 100/72, pulse 95, temperature 99 F (37.2 C), temperature source Oral, resp. rate 20, height 4\' 11"  (1.499 m), weight 73 lb 11.2 oz (33.4 kg), SpO2 100 %.  GENERAL:  51 y.o.-year-old patient lying in the bed with no acute distress.  EYES: Pupils equal, round, reactive to light and accommodation. No scleral icterus. Extraocular muscles intact.  HEENT: Head atraumatic, normocephalic. Oropharynx and nasopharynx clear.  NECK:  Supple, no jugular venous distention. No thyroid enlargement, no tenderness.  LUNGS: Normal breath sounds bilaterally, no wheezing, rales,rhonchi or crepitation. No use of accessory muscles of respiration.  CARDIOVASCULAR: S1, S2 normal. No murmurs, rubs, or gallops.  ABDOMEN: Soft, nontender, nondistended. Bowel sounds present. No organomegaly or mass.  EXTREMITIES: No pedal edema, cyanosis, or clubbing.  NEUROLOGIC: Cranial nerves II through XII are intact. Muscle strength 5/5 in all extremities. Sensation intact. Gait not checked.  PSYCHIATRIC:  The patient is alert and oriented x 3.  SKIN: No obvious rash, lesion, or ulcer.   Data Review     CBC w Diff:  Lab Results  Component Value Date   WBC 9.4 11/03/2016   HGB 13.4 11/03/2016   HGB 16.7 (H) 04/25/2013   HCT 39.3 11/03/2016   HCT 51.2 (H) 04/25/2013   PLT 300 11/03/2016   PLT 373 04/25/2013   LYMPHOPCT 19 10/31/2016   MONOPCT 5 10/31/2016   EOSPCT 0 10/31/2016   BASOPCT 1 10/31/2016   CMP:  Lab Results  Component Value Date   NA 139 11/07/2016   NA 129 (L) 04/25/2013   K 4.3  11/07/2016   K 5.7 (H) 04/25/2013   CL 115 (H) 11/07/2016   CL 97 (L) 04/25/2013   CO2 19 (L) 11/07/2016   CO2 12 (L) 04/25/2013   BUN 8 11/07/2016   BUN 17 04/25/2013   CREATININE 0.80 11/07/2016   CREATININE 0.87 04/25/2013   PROT 7.4 10/31/2016   PROT 8.6 (H) 04/25/2013   ALBUMIN 2.8 (L) 11/06/2016   ALBUMIN 3.9 04/25/2013   BILITOT 1.7 (H) 10/31/2016   BILITOT 0.8 04/25/2013   ALKPHOS 86 10/31/2016   ALKPHOS 106 04/25/2013   AST 57 (H) 10/31/2016   AST 35 04/25/2013   ALT 35 10/31/2016   ALT 29 04/25/2013  .  Micro Results Recent Results (from the past 240 hour(s))  Urine Culture     Status: Abnormal   Collection Time: 10/31/16  7:49 PM  Result Value Ref Range Status   Specimen Description URINE, CLEAN CATCH  Final   Special Requests Normal  Final   Culture >=100,000 COLONIES/mL ESCHERICHIA COLI (A)  Final   Report Status 11/03/2016 FINAL  Final   Organism ID, Bacteria ESCHERICHIA COLI (A)  Final      Susceptibility   Escherichia coli - MIC*    AMPICILLIN 8 SENSITIVE Sensitive     CEFAZOLIN <=4 SENSITIVE Sensitive     CEFTRIAXONE <=1 SENSITIVE Sensitive     CIPROFLOXACIN <=0.25 SENSITIVE Sensitive     GENTAMICIN <=1 SENSITIVE Sensitive     IMIPENEM <=0.25 SENSITIVE Sensitive     NITROFURANTOIN <=16 SENSITIVE Sensitive     TRIMETH/SULFA <=20 SENSITIVE Sensitive     AMPICILLIN/SULBACTAM <=2 SENSITIVE Sensitive     PIP/TAZO <=4 SENSITIVE Sensitive     Extended ESBL NEGATIVE Sensitive     * >=100,000 COLONIES/mL ESCHERICHIA COLI  Gastrointestinal Panel by PCR , Stool     Status: None   Collection Time: 11/01/16  2:40 PM  Result Value Ref Range Status   Campylobacter species NOT DETECTED NOT DETECTED Final   Plesimonas shigelloides NOT DETECTED NOT DETECTED Final   Salmonella species NOT DETECTED NOT DETECTED Final   Yersinia enterocolitica NOT DETECTED NOT DETECTED Final   Vibrio species NOT DETECTED NOT DETECTED Final   Vibrio cholerae NOT DETECTED NOT  DETECTED Final   Enteroaggregative E coli (EAEC) NOT DETECTED NOT DETECTED Final   Enteropathogenic E coli (EPEC) NOT DETECTED NOT DETECTED Final   Enterotoxigenic E coli (ETEC) NOT DETECTED NOT DETECTED Final   Shiga like toxin producing E coli (STEC) NOT DETECTED NOT DETECTED Final   Shigella/Enteroinvasive E coli (EIEC) NOT DETECTED NOT DETECTED Final   Cryptosporidium NOT DETECTED NOT DETECTED Final   Cyclospora cayetanensis NOT DETECTED NOT DETECTED Final   Entamoeba histolytica NOT DETECTED NOT DETECTED Final   Giardia lamblia NOT DETECTED NOT DETECTED Final   Adenovirus F40/41 NOT DETECTED  NOT DETECTED Final   Astrovirus NOT DETECTED NOT DETECTED Final   Norovirus GI/GII NOT DETECTED NOT DETECTED Final   Rotavirus A NOT DETECTED NOT DETECTED Final   Sapovirus (I, II, IV, and V) NOT DETECTED NOT DETECTED Final  C difficile quick scan w PCR reflex     Status: None   Collection Time: 11/01/16  2:40 PM  Result Value Ref Range Status   C Diff antigen NEGATIVE NEGATIVE Final   C Diff toxin NEGATIVE NEGATIVE Final   C Diff interpretation No C. difficile detected.  Final        Code Status Orders        Start     Ordered   10/31/16 2200  Full code  Continuous     10/31/16 2159    Code Status History    Date Active Date Inactive Code Status Order ID Comments User Context   04/26/2013  1:40 AM 04/29/2013  9:57 PM Full Code [id number]  Guy Begin, MD Inpatient          Follow-up Information    Jonathon Bellows, MD. Go on 12/05/2016.   Specialty:  Surgery Why:  Wednesday at 1:00pm for hospital follow-up diarrhea Contact information: [place] [address] [phone]        Lavonia Dana, MD Follow up in 1 week(s).   Specialty:  Internal Medicine Why:  low k+ Contact information: [address] [place] [address] [phone]           Discharge Medications   Allergies as of 11/07/2016   No Known  Allergies     Medication List    STOP taking these medications   hydrochlorothiazide 25 MG tablet Commonly known as:  HYDRODIURIL   lisinopril 20 MG tablet Commonly known as:  PRINIVIL,ZESTRIL   metFORMIN 500 MG tablet Commonly known as:  GLUCOPHAGE     TAKE these medications   amLODipine 5 MG tablet Commonly known as:  NORVASC Take 1 tablet (5 mg total) by mouth daily.   aspirin 81 MG tablet Take 81 mg by mouth daily.   cefUROXime 500 MG tablet Commonly known as:  CEFTIN Take 1 tablet (500 mg total) by mouth 2 (two) times daily with a meal.   insulin glargine 100 UNIT/ML injection Commonly known as:  LANTUS Inject 0.25 mLs (25 Units total) into the skin at bedtime.   loperamide 2 MG capsule Commonly known as:  IMODIUM Take 2 capsules (4 mg total) by mouth as needed for diarrhea or loose stools.            Durable Medical Equipment        Start     Ordered   11/07/16 1152  For home use only DME Walker rolling  Once    Question:  Patient needs a walker to treat with the following condition  Answer:  Weakness   11/07/16 1151         Total Time in preparing paper work, data evaluation and todays exam - 35 minutes  Dustin Flock M.D on 11/07/2016 at 12:50 PM  [facility]   Office  [phone]

## 2016-11-07 NOTE — Progress Notes (Signed)
[place] for electrolyte management  Indication: severe hypokalemia   Pharmacy consulted for electrolyte management for 51 yo female admitted with diarrhea being treated for hypokalemia.  Patient ordered potassium 28mEq PO Q2hr x 3 and Potassium 50mEq IV x 4.   Plan:  Will obtain BMP with am labs.   7/4 @ 0500 K+ 4.3 WNL. No further supplementation needed at this time.  No Known Allergies  Patient Measurements: Height: 4\' 11"  (149.9 cm) Weight: 73 lb 11.2 oz (33.4 kg) IBW/kg (Calculated) : 43.2   Vital Signs: Temp: 99.2 F (37.3 C) (07/04 0458) Temp Source: Oral (07/04 0458) BP: 106/73 (07/04 0458) Pulse Rate: 81 (07/04 0458) Intake/Output from previous day: 07/03 0701 - 07/04 0700 In: 1453 [P.O.:955; I.V.:398; IV Piggyback:100] Out: 800 [Urine:800] Intake/Output from this shift: Total I/O In: 695 [P.O.:595; IV Piggyback:100] Out: 550 [Urine:550]  Labs:  Recent Labs  11/05/16 0450 11/06/16 0756 11/07/16 0459  CREATININE 0.95 0.88 0.80  MG 1.7 2.9*  --   PHOS 2.8 3.6  --   ALBUMIN  --  2.8*  --    Estimated Creatinine Clearance: 43.4 mL/min (by C-G formula based on SCr of 0.8 mg/dL).    Medical History: Past Medical History:  Diagnosis Date  . AKI (acute kidney injury) ([place]) 10/31/2016  . Diabetes ([place])   . GERD (gastroesophageal reflux disease)   . Hypertension   . Non-smoker     Pharmacy will continue to monitor and adjust per consult.   Tobie Lords, PharmD, BCPS Clinical Pharmacist 11/07/2016

## 2016-11-07 NOTE — Progress Notes (Signed)
Pt c/o burning in iv r/t potassium. Notified dr. Jannifer Franklin. Acknowledged and new orders placed.

## 2016-11-07 NOTE — Care Management (Signed)
Walker ordered from [facility]. Can not have home health PT because she is uninsured. Dr. Posey Pronto updated. Patient states she has her insulin for the night.

## 2016-11-29 ENCOUNTER — Ambulatory Visit: Payer: Self-pay | Admitting: Family Medicine

## 2016-11-29 VITALS — BP 162/115 | HR 106 | Temp 99.0°F | Wt 87.0 lb

## 2016-11-29 DIAGNOSIS — E119 Type 2 diabetes mellitus without complications: Secondary | ICD-10-CM

## 2016-11-29 MED ORDER — PIOGLITAZONE HCL 15 MG PO TABS: 15.0000 mg | ORAL_TABLET | Freq: Every day | ORAL | Status: DC

## 2016-11-29 MED ORDER — LISINOPRIL 10 MG PO TABS: 10.0000 mg | ORAL_TABLET | Freq: Every day | ORAL | 5 refills | Status: DC

## 2016-11-29 MED ORDER — AMLODIPINE BESYLATE 5 MG PO TABS: 5.0000 mg | ORAL_TABLET | Freq: Every day | ORAL | 5 refills | Status: DC

## 2016-11-29 NOTE — Progress Notes (Signed)
Subjective:    Patient ID: Erin Hayes, female    DOB: 12/24/1965, 51 y.o.   MRN: [id number]  HPI   Pt reports not taking any meds since hospitalization for diarrhea; sees GI next week for continued diarrhea   Patient Active Problem List   Diagnosis Date Noted  . Protein-calorie malnutrition, severe 11/02/2016  . AKI (acute kidney injury) (HCC) 10/31/2016  . Diarrhea 10/31/2016  . UTI (urinary tract infection) 10/31/2016  . Hypokalemia 04/28/2013  . Leukocytosis, unspecified 04/28/2013  . Encephalopathy 04/28/2013  . DKA (diabetic ketoacidoses) (HCC) 04/26/2013  . Diabetes (HCC)   . Hypertension    Allergies as of 11/29/2016   No Known Allergies     Medication List       Accurate as of 11/29/16  7:00 PM. Always use your most recent med list.          amLODipine 5 MG tablet Commonly known as:  NORVASC Take 1 tablet (5 mg total) by mouth daily.   aspirin 81 MG tablet Take 81 mg by mouth daily.   cefUROXime 500 MG tablet Commonly known as:  CEFTIN Take 1 tablet (500 mg total) by mouth 2 (two) times daily with a meal.   insulin glargine 100 UNIT/ML injection Commonly known as:  LANTUS Inject 0.25 mLs (25 Units total) into the skin at bedtime.   loperamide 2 MG capsule Commonly known as:  IMODIUM Take 2 capsules (4 mg total) by mouth as needed for diarrhea or loose stools.         Review of Systems     Objective:   Physical Exam  Constitutional: She is oriented to person, place, and time. She appears well-developed and well-nourished.  Cachectic  HENT:  Head: Normocephalic and atraumatic.  Eyes: No scleral icterus.  Cardiovascular: Normal rate, regular rhythm and normal heart sounds.   Pulmonary/Chest: Effort normal and breath sounds normal.  Musculoskeletal: She exhibits no edema.  Neurological: She is alert and oriented to person, place, and time.  Skin: Skin is warm and dry.  Psychiatric: She has a normal mood and affect. Her behavior is  normal. Judgment and thought content normal.      BP (!) 162/115 (BP Location: Left Arm)   Pulse (!) 106   Temp 99 F (37.2 C)   Wt 87 lb (39.5 kg)   BMI 17.57 kg/m           Assessment & Plan:  TIIDM Controlled. Start Lisinopril 10mg twice daily Start Pioglitazone 15 mg once daily   Cachexia Labs tonight: Met C, A1C, CBC, TSH, Lipid   Follow up in 1 mo   Richard Gilbert MD  

## 2016-12-05 ENCOUNTER — Encounter: Payer: Self-pay | Admitting: Gastroenterology

## 2016-12-05 ENCOUNTER — Ambulatory Visit (INDEPENDENT_AMBULATORY_CARE_PROVIDER_SITE_OTHER): Payer: Self-pay | Admitting: Gastroenterology

## 2016-12-05 VITALS — BP 134/98 | HR 101 | Temp 98.8°F | Ht 59.0 in | Wt 84.6 lb

## 2016-12-05 DIAGNOSIS — R197 Diarrhea, unspecified: Secondary | ICD-10-CM

## 2016-12-05 NOTE — Progress Notes (Signed)
Jonathon Bellows MD, MRCP(U.K) [address]  [place]  [place], [place] [address]  Main: [phone]  Fax: [phone]   Primary Care Physician: Patient, No Pcp Per  Primary Gastroenterologist:  Dr. Jonathon Bellows   Chief Complaint  Patient presents with  . Hospitalization Follow-up  . Diverticulosis  . Hemorrhoids    pt stated resolved  . Diarrhea    still occuring 1-2 times daily    HPI: Erin Hayes is a 51 y.o. female   She is here today for a hospital follow up .   She was seen by Dr Michail Sermon on 11/03/16 when she presented with chronic diarrhea for 3 months in duration . CT abdomen in 09/2016 showed colon wall thickening . No prior colonoscopy . Hb 14.3 , PLT 354 , very low potassium , AKI on admission. Stool tested negative for c diff and GI PCR. She underwent a flexible sigmoidoscopy during the hospitalization and diffuse colinic edema was seen , random colon biopsies were taken . No abnormalities were seen per pathology report.   Interval history   11/2016-  12/05/2016   Since discharge diarrhea is better - takes imodium , has 2-3 bowel movement , sometimes its soft and formed, still watery at times. No gas or bloating . Denies any weight loss. She has been on Metformin for 3 years. It was stopped at the hospital . Since the diarrhea began it is doing much better. No rectal bleeding   Uses splenda 10-15 packets a day , 1-2 diet sodas a week , unsweetended tea with splenda.   Current Outpatient Prescriptions  Medication Sig Dispense Refill  . Blood Glucose Monitoring Suppl (FIFTY50 GLUCOSE METER 2.0) w/Device KIT Frequency:PHARMDIR   Dosage:0.0     Instructions:  Note:use as directed by [place] (up to four times daily) Dose: 1 LANCET    . glipiZIDE (GLUCOTROL) 10 MG tablet Take 10 mg by mouth.    [name] glucose blood (BAYER CONTOUR TEST) test strip by Other route Two (2) times a day.    . insulin NPH-regular Human (NOVOLIN 70/30) (70-30) 100 UNIT/ML injection 10  units ac bid .    . lisinopril-hydrochlorothiazide (PRINZIDE,ZESTORETIC) 20-25 MG tablet TAKE ONE TABLET BY MOUTH ONCE DAILY    . metFORMIN (GLUCOPHAGE) 1000 MG tablet Take 1,000 mg by mouth.    [name] amLODipine (NORVASC) 5 MG tablet Take 1 tablet (5 mg total) by mouth daily. 30 tablet 5  . aspirin 81 MG tablet Take 81 mg by mouth daily.    . cefUROXime (CEFTIN) 500 MG tablet Take 1 tablet (500 mg total) by mouth 2 (two) times daily with a meal. (Patient not taking: Reported on 11/29/2016) 6 tablet 0  . insulin glargine (LANTUS) 100 UNIT/ML injection Inject 0.25 mLs (25 Units total) into the skin at bedtime. (Patient not taking: Reported on 10/31/2016) 10 mL 11  . lisinopril (PRINIVIL,ZESTRIL) 10 MG tablet Take 1 tablet (10 mg total) by mouth daily. 30 tablet 5  . loperamide (IMODIUM) 2 MG capsule Take 2 capsules (4 mg total) by mouth as needed for diarrhea or loose stools. (Patient not taking: Reported on 11/29/2016) 30 capsule 0   Current Facility-Administered Medications  Medication Dose Route Frequency Provider Last Rate Last Dose  . pioglitazone (ACTOS) tablet 15 mg  15 mg Oral Daily Jerrol Banana., MD        Allergies as of 12/05/2016  . (No Known Allergies)    ROS:  General: Negative for  anorexia, weight loss, fever, chills, fatigue, weakness. ENT: Negative for hoarseness, difficulty swallowing , nasal congestion. CV: Negative for chest pain, angina, palpitations, dyspnea on exertion, peripheral edema.  Respiratory: Negative for dyspnea at rest, dyspnea on exertion, cough, sputum, wheezing.  GI: See history of present illness. GU:  Negative for dysuria, hematuria, urinary incontinence, urinary frequency, nocturnal urination.  Endo: Negative for unusual weight change.    Physical Examination:   BP (!) 134/98   Pulse (!) 101   Temp 98.8 F (37.1 C) (Oral)   Ht _0  (1.499 m)   Wt 84 lb 9.6 oz (38.4 kg)   BMI 17.09 kg/m   General: Well-nourished, well-developed in no  acute distress.  Eyes: No icterus. Conjunctivae pink. Mouth: Oropharyngeal mucosa moist and pink , no lesions erythema or exudate. Lungs: Clear to auscultation bilaterally. Non-labored. Heart: Regular rate and rhythm, no murmurs rubs or gallops.  Abdomen: Bowel sounds are normal, nontender, nondistended, no hepatosplenomegaly or masses, no abdominal bruits or hernia , no rebound or guarding.   Extremities: No lower extremity edema. No clubbing or deformities. Neuro: Alert and oriented x 3.  Grossly intact. Skin: Warm and dry, no jaundice.   Psych: Alert and cooperative, normal mood and affect.   Imaging Studies: No results found.  Assessment and Plan:   Erin Hayes is a 51 y.o. y/o female here to follow up for severe diarrhea of a few weeks in duration .Flexible sigmoidoscopy was negative in terms of biopsies. She uses 15 packets of splenda a day which can cause an osmotic diarrhea.  Plan  1. Stop all artificial sweeteners for next few weeks 2. Check stool studies for osmolar gap , leucocytes and c diff 3. 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

## 2016-12-13 ENCOUNTER — Other Ambulatory Visit: Payer: Self-pay

## 2016-12-13 DIAGNOSIS — E119 Type 2 diabetes mellitus without complications: Secondary | ICD-10-CM

## 2016-12-14 LAB — COMPREHENSIVE METABOLIC PANEL
ALK PHOS: 100 IU/L (ref 39–117)
ALT: 9 IU/L (ref 0–32)
AST: 14 IU/L (ref 0–40)
Albumin/Globulin Ratio: 1.6 (ref 1.2–2.2)
Albumin: 3.9 g/dL (ref 3.5–5.5)
BUN / CREAT RATIO: 12 (ref 9–23)
BUN: 8 mg/dL (ref 6–24)
Bilirubin Total: 0.4 mg/dL (ref 0.0–1.2)
CHLORIDE: 103 mmol/L (ref 96–106)
CO2: 22 mmol/L (ref 20–29)
Calcium: 8.7 mg/dL (ref 8.7–10.2)
Creatinine, Ser: 0.66 mg/dL (ref 0.57–1.00)
GFR calc Af Amer: 117 mL/min/{1.73_m2} (ref >59)
GFR calc non Af Amer: 101 mL/min/{1.73_m2} (ref >59)
GLUCOSE: 161 mg/dL — AB (ref 65–99)
Globulin, Total: 2.4 g/dL (ref 1.5–4.5)
Potassium: 4.1 mmol/L (ref 3.5–5.2)
SODIUM: 141 mmol/L (ref 134–144)
Total Protein: 6.3 g/dL (ref 6.0–8.5)

## 2016-12-14 LAB — CBC
HEMATOCRIT: 35.7 % (ref 34.0–46.6)
Hemoglobin: 11.3 g/dL (ref 11.1–15.9)
MCH: 27.2 pg (ref 26.6–33.0)
MCHC: 31.7 g/dL (ref 31.5–35.7)
MCV: 86 fL (ref 79–97)
PLATELETS: 320 10*3/uL (ref 150–379)
RBC: 4.15 x10E6/uL (ref 3.77–5.28)
RDW: 14.6 % (ref 12.3–15.4)
WBC: 6.7 10*3/uL (ref 3.4–10.8)

## 2016-12-14 LAB — LIPID PANEL W/O CHOL/HDL RATIO
CHOLESTEROL TOTAL: 157 mg/dL (ref 100–199)
HDL: 72 mg/dL (ref >39)
LDL Calculated: 48 mg/dL (ref 0–99)
TRIGLYCERIDES: 186 mg/dL — AB (ref 0–149)
VLDL Cholesterol Cal: 37 mg/dL (ref 5–40)

## 2016-12-20 ENCOUNTER — Telehealth: Payer: Self-pay

## 2016-12-20 NOTE — Telephone Encounter (Signed)
Received PAP application from [facility] for Lantus placed for provider to sign. 

## 2016-12-20 NOTE — Telephone Encounter (Signed)
Placed signed application/script in [facility] folder for pickup. 

## 2016-12-27 ENCOUNTER — Telehealth: Payer: Self-pay | Admitting: Pharmacist

## 2016-12-27 NOTE — Telephone Encounter (Signed)
12/27/16 Faxed refill request to [facility] for Lantus Solostar pens Inject 25 units (0.25 mLs) under the skin daily at bedtime # 2, also faxed page one of application due to change in provider. New provider Dr. Andrey Farmer, previous Zara Council, PA.Delos Haring

## 2017-01-03 ENCOUNTER — Ambulatory Visit: Payer: Self-pay

## 2017-01-10 ENCOUNTER — Other Ambulatory Visit: Payer: Self-pay

## 2017-01-10 ENCOUNTER — Encounter (INDEPENDENT_AMBULATORY_CARE_PROVIDER_SITE_OTHER): Payer: Self-pay

## 2017-01-10 ENCOUNTER — Ambulatory Visit (INDEPENDENT_AMBULATORY_CARE_PROVIDER_SITE_OTHER): Payer: Self-pay | Admitting: Gastroenterology

## 2017-01-10 ENCOUNTER — Encounter: Payer: Self-pay | Admitting: Gastroenterology

## 2017-01-10 VITALS — BP 135/98 | HR 103 | Temp 99.2°F | Ht 59.0 in | Wt 89.0 lb

## 2017-01-10 DIAGNOSIS — R197 Diarrhea, unspecified: Secondary | ICD-10-CM

## 2017-01-10 DIAGNOSIS — I10 Essential (primary) hypertension: Secondary | ICD-10-CM

## 2017-01-10 MED ORDER — LISINOPRIL 10 MG PO TABS: 10.0000 mg | ORAL_TABLET | Freq: Every day | ORAL | 5 refills | Status: DC

## 2017-01-10 MED ORDER — AMLODIPINE BESYLATE 5 MG PO TABS: 5.0000 mg | ORAL_TABLET | Freq: Every day | ORAL | 5 refills | Status: DC

## 2017-01-10 NOTE — Progress Notes (Signed)
Jonathon Bellows MD, MRCP(U.K) [address]  [place]  [place], [place] [address]  Main: [phone]  Fax: [phone]   Primary Care Physician: Patient, No Pcp Per  Primary Gastroenterologist:  Dr. Jonathon Bellows   Chief Complaint  Patient presents with  . Follow-up    HPI: Erin Hayes is a 51 y.o. female   Summary of history : She was seen by Dr Michail Sermon on 11/03/16 as an inpatient at [facility] when she presented with chronic diarrhea for 3 months in duration . CT abdomen in 09/2016 showed colon wall thickening . No prior colonoscopy . Hb 14.3 , PLT 354 , very low potassium , AKI on admission. Stool tested negative for c diff and GI PCR. She underwent a flexible sigmoidoscopy during the hospitalization and diffuse colinic edema was seen , random colon biopsies were taken . No abnormalities were seen per pathology report. At her visit on 12/05/16 I found out that she uses splenda 10-15 packets a day , 1-2 diet sodas a week , unsweetended tea with splenda.   Interval history     12/05/2016 -01/10/17  Did not obtain stool studies to calculate osmolar gap or celiac panel or fecal lactoferrin .  Since discharge diarrhea is better- presently has 2-3 bowel movements a day , runny . Says she has stopped all her Splenda,   Current Outpatient Prescriptions  Medication Sig Dispense Refill  . aspirin 81 MG tablet Take 81 mg by mouth daily.    . Blood Glucose Monitoring Suppl (FIFTY50 GLUCOSE METER 2.0) w/Device KIT Frequency:PHARMDIR   Dosage:0.0     Instructions:  Note:use as directed by [place] (up to four times daily) Dose: 1 LANCET    . glipiZIDE (GLUCOTROL) 10 MG tablet Take 10 mg by mouth.    [name] glucose blood (BAYER CONTOUR TEST) test strip by Other route Two (2) times a day.    . lisinopril-hydrochlorothiazide (PRINZIDE,ZESTORETIC) 20-25 MG tablet TAKE ONE TABLET BY MOUTH ONCE DAILY    . loperamide (IMODIUM) 2 MG capsule Take 2 capsules (4 mg total) by mouth as needed for diarrhea  or loose stools. 30 capsule 0  . amLODipine (NORVASC) 5 MG tablet Take 1 tablet (5 mg total) by mouth daily. 30 tablet 5  . lisinopril (PRINIVIL,ZESTRIL) 10 MG tablet Take 1 tablet (10 mg total) by mouth daily. 30 tablet 5   Current Facility-Administered Medications  Medication Dose Route Frequency Provider Last Rate Last Dose  . pioglitazone (ACTOS) tablet 15 mg  15 mg Oral Daily Jerrol Banana., MD        Allergies as of 01/10/2017  . (No Known Allergies)    ROS:  General: Negative for anorexia, weight loss, fever, chills, fatigue, weakness. ENT: Negative for hoarseness, difficulty swallowing , nasal congestion. CV: Negative for chest pain, angina, palpitations, dyspnea on exertion, peripheral edema.  Respiratory: Negative for dyspnea at rest, dyspnea on exertion, cough, sputum, wheezing.  GI: See history of present illness. GU:  Negative for dysuria, hematuria, urinary incontinence, urinary frequency, nocturnal urination.  Endo: Negative for unusual weight change.    Physical Examination:   BP (!) 135/98 (BP Location: Right Arm, Patient Position: Sitting, Cuff Size: Normal)   Pulse (!) 103   Temp 99.2 F (37.3 C) (Oral)   Ht '4\' 11"'$  (1.499 m)   Wt 89 lb (40.4 kg)   BMI 17.98 kg/m   General: Well-nourished, well-developed in no acute distress.  Eyes: No icterus. Conjunctivae pink. Mouth: Oropharyngeal  mucosa moist and pink , no lesions erythema or exudate. Lungs: Clear to auscultation bilaterally. Non-labored. Heart: Regular rate and rhythm, no murmurs rubs or gallops.  Abdomen: Bowel sounds are normal, nontender, nondistended, no hepatosplenomegaly or masses, no abdominal bruits or hernia , no rebound or guarding.   Extremities: No lower extremity edema. No clubbing or deformities. Neuro: Alert and oriented x 3.  Grossly intact. Skin: Warm and dry, no jaundice.   Psych: Alert and cooperative, normal mood and affect.   Imaging Studies: No results  found.  Assessment and Plan:   Erin Hayes is a 51 y.o. y/o female here to follow up for severe diarrhea of a few weeks in duration .No better after last visit despite stopping her splenda sugar substitute.   Plan  1. Stop all artificial sweeteners  2. Check stool studies for  leucocytes and c diff 3.  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

## 2017-01-10 NOTE — Addendum Note (Signed)
Addended by: Peggye Ley on: 01/10/2017 04:38 PM   Modules accepted: Orders, SmartSet

## 2017-01-21 ENCOUNTER — Ambulatory Visit
Admission: RE | Admit: 2017-01-21 | Discharge: 2017-01-21 | Disposition: A | Discharge status: Home / Self Care | Payer: Self-pay | Source: Ambulatory Visit | Attending: Gastroenterology | Admitting: Gastroenterology

## 2017-01-21 ENCOUNTER — Encounter
Admission: RE | Disposition: A | Discharge status: Home / Self Care | Payer: Self-pay | Source: Ambulatory Visit | Attending: Gastroenterology

## 2017-01-21 ENCOUNTER — Ambulatory Visit: Payer: Self-pay | Admitting: Anesthesiology

## 2017-01-21 DIAGNOSIS — R197 Diarrhea, unspecified: Secondary | ICD-10-CM | POA: Insufficient documentation

## 2017-01-21 DIAGNOSIS — K621 Rectal polyp: Secondary | ICD-10-CM | POA: Insufficient documentation

## 2017-01-21 DIAGNOSIS — D123 Benign neoplasm of transverse colon: Secondary | ICD-10-CM | POA: Insufficient documentation

## 2017-01-21 DIAGNOSIS — Z7982 Long term (current) use of aspirin: Secondary | ICD-10-CM | POA: Insufficient documentation

## 2017-01-21 DIAGNOSIS — Z7984 Long term (current) use of oral hypoglycemic drugs: Secondary | ICD-10-CM | POA: Insufficient documentation

## 2017-01-21 DIAGNOSIS — Z79899 Other long term (current) drug therapy: Secondary | ICD-10-CM | POA: Insufficient documentation

## 2017-01-21 DIAGNOSIS — I10 Essential (primary) hypertension: Secondary | ICD-10-CM | POA: Insufficient documentation

## 2017-01-21 DIAGNOSIS — K219 Gastro-esophageal reflux disease without esophagitis: Secondary | ICD-10-CM | POA: Insufficient documentation

## 2017-01-21 DIAGNOSIS — Z8249 Family history of ischemic heart disease and other diseases of the circulatory system: Secondary | ICD-10-CM | POA: Insufficient documentation

## 2017-01-21 DIAGNOSIS — E119 Type 2 diabetes mellitus without complications: Secondary | ICD-10-CM | POA: Insufficient documentation

## 2017-01-21 DIAGNOSIS — K3189 Other diseases of stomach and duodenum: Secondary | ICD-10-CM | POA: Insufficient documentation

## 2017-01-21 HISTORY — PX: COLONOSCOPY WITH PROPOFOL: SHX5780

## 2017-01-21 HISTORY — PX: ESOPHAGOGASTRODUODENOSCOPY (EGD) WITH PROPOFOL: SHX5813

## 2017-01-21 LAB — POCT PREGNANCY, URINE: Preg Test, Ur: NEGATIVE

## 2017-01-21 LAB — GLUCOSE, CAPILLARY: GLUCOSE-CAPILLARY: 114 mg/dL — AB (ref 65–99)

## 2017-01-21 SURGERY — ESOPHAGOGASTRODUODENOSCOPY (EGD) WITH PROPOFOL
Anesthesia: General

## 2017-01-21 MED ORDER — LIDOCAINE HCL (PF) 1 % IJ SOLN
INTRAMUSCULAR | Status: AC
  Administered 2017-01-21: 0.3 mL via INTRADERMAL
  Filled 2017-01-21: qty 2

## 2017-01-21 MED ORDER — GLYCOPYRROLATE 0.2 MG/ML IJ SOLN
INTRAMUSCULAR | Status: AC
  Filled 2017-01-21: qty 2

## 2017-01-21 MED ORDER — PROPOFOL 500 MG/50ML IV EMUL
INTRAVENOUS | Status: DC | PRN
  Administered 2017-01-21: 140 ug/kg/min via INTRAVENOUS

## 2017-01-21 MED ORDER — LIDOCAINE 2% (20 MG/ML) 5 ML SYRINGE
INTRAMUSCULAR | Status: DC | PRN
  Administered 2017-01-21: 40 mg via INTRAVENOUS

## 2017-01-21 MED ORDER — SODIUM CHLORIDE 0.9 % IV SOLN
INTRAVENOUS | Status: DC
  Administered 2017-01-21: 1000 mL via INTRAVENOUS

## 2017-01-21 MED ORDER — FENTANYL CITRATE (PF) 100 MCG/2ML IJ SOLN
INTRAMUSCULAR | Status: DC | PRN
  Administered 2017-01-21 (×2): 50 ug via INTRAVENOUS

## 2017-01-21 MED ORDER — METOPROLOL TARTRATE 5 MG/5ML IV SOLN
INTRAVENOUS | Status: AC
  Filled 2017-01-21: qty 5

## 2017-01-21 MED ORDER — PROPOFOL 500 MG/50ML IV EMUL
INTRAVENOUS | Status: AC
  Filled 2017-01-21: qty 50

## 2017-01-21 MED ORDER — LIDOCAINE HCL (PF) 2 % IJ SOLN
INTRAMUSCULAR | Status: AC
  Filled 2017-01-21: qty 2

## 2017-01-21 MED ORDER — SODIUM CHLORIDE 0.9 % IJ SOLN
INTRAMUSCULAR | Status: AC
  Filled 2017-01-21: qty 10

## 2017-01-21 MED ORDER — FENTANYL CITRATE (PF) 100 MCG/2ML IJ SOLN
INTRAMUSCULAR | Status: AC
  Filled 2017-01-21: qty 2

## 2017-01-21 MED ORDER — SODIUM CHLORIDE 0.9 % IV SOLN
INTRAVENOUS | Status: DC | PRN
  Administered 2017-01-21: 07:00:00 via INTRAVENOUS

## 2017-01-21 MED ORDER — PROPOFOL 10 MG/ML IV BOLUS
INTRAVENOUS | Status: AC
  Filled 2017-01-21: qty 20

## 2017-01-21 MED ORDER — PHENYLEPHRINE HCL 10 MG/ML IJ SOLN
INTRAMUSCULAR | Status: AC
  Filled 2017-01-21: qty 1

## 2017-01-21 MED ORDER — METOPROLOL TARTRATE 5 MG/5ML IV SOLN
INTRAVENOUS | Status: DC | PRN
  Administered 2017-01-21 (×2): 1 mg via INTRAVENOUS

## 2017-01-21 MED ORDER — LIDOCAINE HCL (PF) 1 % IJ SOLN
2.0000 mL | Freq: Once | INTRAMUSCULAR | Status: AC
  Administered 2017-01-21: 0.3 mL via INTRADERMAL

## 2017-01-21 MED ORDER — PROPOFOL 10 MG/ML IV BOLUS
INTRAVENOUS | Status: DC | PRN
  Administered 2017-01-21: 100 mg via INTRAVENOUS

## 2017-01-21 NOTE — H&P (Signed)
Jonathon Bellows MD [address] [place], [place] [address] Phone: [phone] Fax : [phone]  Primary Care Physician:  Patient, No Pcp Per Primary Gastroenterologist:  Dr. Jonathon Bellows   Pre-Procedure History & Physical: HPI:  Erin Hayes is a 51 y.o. female is here for an endoscopy and colonoscopy.   Past Medical History:  Diagnosis Date  . AKI (acute kidney injury) ([place]) 10/31/2016  . Diabetes ([place])   . GERD (gastroesophageal reflux disease)   . Hypertension   . Non-smoker     Past Surgical History:  Procedure Laterality Date  . FLEXIBLE SIGMOIDOSCOPY N/A 11/04/2016   Procedure: FLEXIBLE SIGMOIDOSCOPY;  Surgeon: Wilford Corner, MD;  Location: [facility] ENDOSCOPY;  Service: Endoscopy;  Laterality: N/A;  . LITHOTRIPSY      Prior to Admission medications   Medication Sig Start Date End Date Taking? Authorizing Provider  amLODipine (NORVASC) 5 MG tablet Take 1 tablet (5 mg total) by mouth daily. 01/10/17   Jerrol Banana., MD  aspirin 81 MG tablet Take 81 mg by mouth daily.    [provider]  Blood Glucose Monitoring Suppl (FIFTY50 GLUCOSE METER 2.0) w/Device KIT Frequency:PHARMDIR   Dosage:0.0     Instructions:  Note:use as directed by [place] (up to four times daily) Dose: 1 LANCET 05/13/13   [provider]  glipiZIDE (GLUCOTROL) 10 MG tablet Take 10 mg by mouth. 06/04/14   [provider]  glucose blood (BAYER CONTOUR TEST) test strip by Other route Two (2) times a day. 05/13/13   [provider]  lisinopril (PRINIVIL,ZESTRIL) 10 MG tablet Take 1 tablet (10 mg total) by mouth daily. 01/10/17   Jerrol Banana., MD  lisinopril-hydrochlorothiazide (PRINZIDE,ZESTORETIC) 20-25 MG tablet TAKE ONE TABLET BY MOUTH ONCE DAILY 06/04/14   [provider]  loperamide (IMODIUM) 2 MG capsule Take 2 capsules (4 mg total) by mouth as needed for diarrhea or loose stools. 11/06/16   Dustin Flock, MD    Allergies as of  01/11/2017  . (No Known Allergies)    Family History  Problem Relation Age of Onset  . Hypertension Father     Social History   Social History  . Marital status: Single    Spouse name: N/A  . Number of children: N/A  . Years of education: N/A   Occupational History  . Not on file.   Social History Main Topics  . Smoking status: Never Smoker  . Smokeless tobacco: Never Used  . Alcohol use No  . Drug use: No  . Sexual activity: Not Currently   Other Topics Concern  . Not on file   Social History Narrative  . No narrative on file    Review of Systems: See HPI, otherwise negative ROS  Physical Exam: BP (!) 166/116   Pulse 97   Temp 98.3 F (36.8 C)   Ht '4\' 11"'$  (1.499 m)   Wt 89 lb (40.4 kg)   SpO2 99%   BMI 17.98 kg/m  General:   Alert,  pleasant and cooperative in NAD Head:  Normocephalic and atraumatic. Neck:  Supple; no masses or thyromegaly. Lungs:  Clear throughout to auscultation.    Heart:  Regular rate and rhythm. Abdomen:  Soft, nontender and nondistended. Normal bowel sounds, without guarding, and without rebound.   Neurologic:  Alert and  oriented x4;  grossly normal neurologically.  Impression/Plan: ROSHANA SHUFFIELD is here for an endoscopy and colonoscopy to be performed for diarrhea (much improved since  her last office visit)  Risks, benefits, limitations, and alternatives regarding  endoscopy and colonoscopy have been reviewed with the patient.  Questions have been answered.  All parties agreeable.   Jonathon Bellows, MD  01/21/2017, 8:11 AM

## 2017-01-21 NOTE — Anesthesia Postprocedure Evaluation (Signed)
Anesthesia Post Note  Patient: Erin Hayes  Procedure(s) Performed: Procedure(s) (LRB): ESOPHAGOGASTRODUODENOSCOPY (EGD) WITH PROPOFOL (N/A) COLONOSCOPY WITH PROPOFOL (N/A)  Patient location during evaluation: Endoscopy Anesthesia Type: General Level of consciousness: awake and alert Pain management: pain level controlled Vital Signs Assessment: post-procedure vital signs reviewed and stable Respiratory status: spontaneous breathing and respiratory function stable Cardiovascular status: stable Anesthetic complications: no     Last Vitals:  Vitals:   01/21/17 0911 01/21/17 0912  BP:  113/80  Pulse:  71  Resp:  13  Temp: (!) (P) 36.1 C (!) 36.1 C  SpO2:  98%    Last Pain:  Vitals:   01/21/17 0911  TempSrc: (P) Tympanic                 Larenz Frasier K

## 2017-01-21 NOTE — Anesthesia Preprocedure Evaluation (Signed)
Anesthesia Evaluation  Patient identified by MRN, date of birth, ID band Patient awake    Reviewed: Allergy & Precautions, NPO status , Patient's Chart, lab work & pertinent test results  History of Anesthesia Complications Negative for: history of anesthetic complications  Airway Mallampati: III       Dental   Pulmonary neg sleep apnea, neg COPD,           Cardiovascular hypertension, Pt. on medications (-) Past MI and (-) CHF (-) dysrhythmias (-) Valvular Problems/Murmurs     Neuro/Psych neg Seizures    GI/Hepatic Neg liver ROS, GERD  Poorly Controlled,  Endo/Other  diabetes, Type 2, Oral Hypoglycemic Agents  Renal/GU negative Renal ROS     Musculoskeletal   Abdominal   Peds  Hematology   Anesthesia Other Findings   Reproductive/Obstetrics                             Anesthesia Physical Anesthesia Plan  ASA: III  Anesthesia Plan: General   Post-op Pain Management:    Induction:   PONV Risk Score and Plan: Propofol infusion  Airway Management Planned:   Additional Equipment:   Intra-op Plan:   Post-operative Plan:   Informed Consent: I have reviewed the patients History and Physical, chart, labs and discussed the procedure including the risks, benefits and alternatives for the proposed anesthesia with the patient or authorized representative who has indicated his/her understanding and acceptance.     Plan Discussed with:   Anesthesia Plan Comments:         Anesthesia Quick Evaluation

## 2017-01-21 NOTE — Transfer of Care (Signed)
Immediate Anesthesia Transfer of Care Note  Patient: Erin Hayes  Procedure(s) Performed: Procedure(s): ESOPHAGOGASTRODUODENOSCOPY (EGD) WITH PROPOFOL (N/A) COLONOSCOPY WITH PROPOFOL (N/A)  Patient Location: PACU and Endoscopy Unit  Anesthesia Type:General  Level of Consciousness: sedated  Airway & Oxygen Therapy: Patient Spontanous Breathing and Patient connected to nasal cannula oxygen  Post-op Assessment: Report given to RN and Post -op Vital signs reviewed and stable  Post vital signs: Reviewed and stable  Last Vitals:  Vitals:   01/21/17 0752 01/21/17 0911  BP: (!) 166/116   Pulse:    Temp:  (!) (P) 36.1 C  SpO2:      Last Pain:  Vitals:   01/21/17 0911  TempSrc: (P) Tympanic         Complications: No apparent anesthesia complications

## 2017-01-21 NOTE — Op Note (Signed)
[facility] Patient Name: Erin Hayes Procedure Date: 01/21/2017 7:34 AM MRN: [id number] Account #: [id number] Date of Birth: 1965/08/25 Admit Type: Outpatient Age: 51 Room: [facility] ENDO ROOM 4 Gender: Female Note Status: Finalized Procedure:            Colonoscopy Indications:          Clinically significant diarrhea of unexplained origin Providers:            Jonathon Bellows MD, MD Medicines:            Monitored Anesthesia Care Complications:        No immediate complications. Procedure:            Pre-Anesthesia Assessment:                       - Prior to the procedure, a History and Physical was                        performed, and patient medications, allergies and                        sensitivities were reviewed. The patient's tolerance of                        previous anesthesia was reviewed.                       - The risks and benefits of the procedure and the                        sedation options and risks were discussed with the                        patient. All questions were answered and informed                        consent was obtained.                       - ASA Grade Assessment: III - A patient with severe                        systemic disease.                       After obtaining informed consent, the colonoscope was                        passed under direct vision. Throughout the procedure,                        the patient's blood pressure, pulse, and oxygen                        saturations were monitored continuously. The                        Colonoscope was introduced through the anus and                        advanced to the the cecum, identified by the  appendiceal orifice, IC valve and transillumination.                        The colonoscopy was performed with ease. The patient                        tolerated the procedure well. The quality of the bowel   preparation was adequate. Findings:      The perianal and digital rectal examinations were normal.      Three sessile polyps were found in the rectum and transverse colon. The       polyps were 3 to 5 mm in size. These polyps were removed with a cold       snare. Resection and retrieval were complete.      Normal mucosa was found in the entire colon. Biopsies for histology were       taken with a cold forceps from the entire colon for evaluation of       microscopic colitis.      The exam was otherwise without abnormality on direct and retroflexion       views. Impression:           - Three 3 to 5 mm polyps in the rectum and in the                        transverse colon, removed with a cold snare. Resected                        and retrieved.                       - Normal mucosa in the entire examined colon. Biopsied.                       - The examination was otherwise normal on direct and                        retroflexion views. Recommendation:       - Discharge patient to home (with escort).                       - Resume previous diet.                       - Continue present medications.                       - Await pathology results.                       - Repeat colonoscopy for surveillance based on                        pathology results.                       - Return to GI office in 6 weeks. Procedure Code(s):    --- Professional ---                       [id number], Colonoscopy, flexible; with removal of tumor(s),  polyp(s), or other lesion(s) by snare technique                       45380, 59, Colonoscopy, flexible; with biopsy, single                        or multiple Diagnosis Code(s):    --- Professional ---                       K62.1, Rectal polyp                       R19.7, Diarrhea, unspecified                       D12.3, Benign neoplasm of transverse colon (hepatic                        flexure or splenic flexure) CPT copyright 2016  American Medical Association. All rights reserved. The codes documented in this report are preliminary and upon coder review may  be revised to meet current compliance requirements. Jonathon Bellows, MD Jonathon Bellows MD, MD 01/21/2017 9:06:56 AM This report has been signed electronically. Number of Addenda: 0 Note Initiated On: 01/21/2017 7:34 AM Scope Withdrawal Time: 0 hours 18 minutes 1 second  Total Procedure Duration: 0 hours 19 minutes 58 seconds       [facility]

## 2017-01-21 NOTE — Anesthesia Post-op Follow-up Note (Signed)
Anesthesia QCDR form completed.        

## 2017-01-21 NOTE — Op Note (Signed)
[facility] Patient Name: Erin Hayes Procedure Date: 01/21/2017 7:33 AM MRN: [id number] Account #: [id number] Date of Birth: 05-22-1965 Admit Type: Outpatient Age: 51 Room: [facility] ENDO ROOM 4 Gender: Female Note Status: Finalized Procedure:            Upper GI endoscopy Indications:          Diarrhea Providers:            Jonathon Bellows MD, MD Medicines:            Monitored Anesthesia Care Complications:        No immediate complications. Procedure:            Pre-Anesthesia Assessment:                       - Prior to the procedure, a History and Physical was                        performed, and patient medications, allergies and                        sensitivities were reviewed. The patient's tolerance of                        previous anesthesia was reviewed.                       - The risks and benefits of the procedure and the                        sedation options and risks were discussed with the                        patient. All questions were answered and informed                        consent was obtained.                       - ASA Grade Assessment: III - A patient with severe                        systemic disease.                       After obtaining informed consent, the endoscope was                        passed under direct vision. Throughout the procedure,                        the patient's blood pressure, pulse, and oxygen                        saturations were monitored continuously. The Endoscope                        was introduced through the mouth, and advanced to the                        third part of duodenum. The upper GI endoscopy was  accomplished with ease. The patient tolerated the                        procedure well. Findings:      The examined duodenum was normal. Biopsies for histology were taken with       a cold forceps for evaluation of celiac disease.      The stomach  was normal.      A hypertonic lower esophageal sphincter was found. There was severe       resistance to endoscope advancement into the stomach. The Z-line was       regular. The gastroesophageal junction and cardia were normal on       retroflexed view. Impression:           - Normal examined duodenum. Biopsied.                       - Normal stomach.                       - The examination was suspicious for achalasia. Recommendation:       - Await pathology results.                       - Return to my office in 6 weeks.                       - If she has history of dysphagia would suggest undergo                        esophageal manometry to r/o achalasia. Procedure Code(s):    --- Professional ---                       [phone], Esophagogastroduodenoscopy, flexible, transoral;                        with biopsy, single or multiple Diagnosis Code(s):    --- Professional ---                       R19.7, Diarrhea, unspecified CPT copyright 2016 American Medical Association. All rights reserved. The codes documented in this report are preliminary and upon coder review may  be revised to meet current compliance requirements. Jonathon Bellows, MD Jonathon Bellows MD, MD 01/21/2017 8:43:02 AM This report has been signed electronically. Number of Addenda: 0 Note Initiated On: 01/21/2017 7:33 AM      [facility]

## 2017-01-22 ENCOUNTER — Encounter: Payer: Self-pay | Admitting: Gastroenterology

## 2017-01-22 LAB — SURGICAL PATHOLOGY

## 2017-02-04 ENCOUNTER — Emergency Department: Payer: Self-pay

## 2017-02-04 ENCOUNTER — Emergency Department
Admission: EM | Admit: 2017-02-04 | Discharge: 2017-02-04 | Disposition: A | Discharge status: Home / Self Care | Payer: Self-pay | Attending: Emergency Medicine | Admitting: Emergency Medicine

## 2017-02-04 ENCOUNTER — Encounter: Payer: Self-pay | Admitting: Emergency Medicine

## 2017-02-04 DIAGNOSIS — E119 Type 2 diabetes mellitus without complications: Secondary | ICD-10-CM | POA: Insufficient documentation

## 2017-02-04 DIAGNOSIS — J918 Pleural effusion in other conditions classified elsewhere: Secondary | ICD-10-CM | POA: Insufficient documentation

## 2017-02-04 DIAGNOSIS — J189 Pneumonia, unspecified organism: Secondary | ICD-10-CM | POA: Insufficient documentation

## 2017-02-04 DIAGNOSIS — J181 Lobar pneumonia, unspecified organism: Secondary | ICD-10-CM

## 2017-02-04 DIAGNOSIS — I10 Essential (primary) hypertension: Secondary | ICD-10-CM | POA: Insufficient documentation

## 2017-02-04 DIAGNOSIS — J90 Pleural effusion, not elsewhere classified: Secondary | ICD-10-CM

## 2017-02-04 LAB — COMPREHENSIVE METABOLIC PANEL
ALT: 20 U/L (ref 14–54)
AST: 16 U/L (ref 15–41)
Albumin: 3.5 g/dL (ref 3.5–5.0)
Alkaline Phosphatase: 87 U/L (ref 38–126)
Anion gap: 9 (ref 5–15)
BUN: 17 mg/dL (ref 6–20)
CHLORIDE: 103 mmol/L (ref 101–111)
CO2: 27 mmol/L (ref 22–32)
Calcium: 9.5 mg/dL (ref 8.9–10.3)
Creatinine, Ser: 0.64 mg/dL (ref 0.44–1.00)
Glucose, Bld: 163 mg/dL — ABNORMAL HIGH (ref 65–99)
Potassium: 3.6 mmol/L (ref 3.5–5.1)
SODIUM: 139 mmol/L (ref 135–145)
Total Bilirubin: 0.9 mg/dL (ref 0.3–1.2)
Total Protein: 6.4 g/dL — ABNORMAL LOW (ref 6.5–8.1)

## 2017-02-04 LAB — TROPONIN I

## 2017-02-04 LAB — CBC
HCT: 34.8 % — ABNORMAL LOW (ref 35.0–47.0)
Hemoglobin: 11.6 g/dL — ABNORMAL LOW (ref 12.0–16.0)
MCH: 28.5 pg (ref 26.0–34.0)
MCHC: 33.4 g/dL (ref 32.0–36.0)
MCV: 85.4 fL (ref 80.0–100.0)
PLATELETS: 270 10*3/uL (ref 150–440)
RBC: 4.08 MIL/uL (ref 3.80–5.20)
RDW: 12.4 % (ref 11.5–14.5)
WBC: 9 10*3/uL (ref 3.6–11.0)

## 2017-02-04 LAB — BRAIN NATRIURETIC PEPTIDE: B Natriuretic Peptide: 602 pg/mL — ABNORMAL HIGH (ref 0.0–100.0)

## 2017-02-04 MED ORDER — LEVOFLOXACIN IN D5W 750 MG/150ML IV SOLN
750.0000 mg | Freq: Once | INTRAVENOUS | Status: AC
  Administered 2017-02-04: 750 mg via INTRAVENOUS
  Filled 2017-02-04: qty 150

## 2017-02-04 MED ORDER — METOPROLOL TARTRATE 5 MG/5ML IV SOLN
10.0000 mg | Freq: Once | INTRAVENOUS | Status: AC
  Administered 2017-02-04: 10 mg via INTRAVENOUS
  Filled 2017-02-04: qty 10

## 2017-02-04 MED ORDER — LEVOFLOXACIN 750 MG PO TABS: 750.0000 mg | ORAL_TABLET | Freq: Every day | ORAL | 0 refills | Status: AC

## 2017-02-04 MED ORDER — SODIUM CHLORIDE 0.9 % IV BOLUS (SEPSIS): 1000.0000 mL | Freq: Once | INTRAVENOUS | Status: DC

## 2017-02-04 MED ORDER — ALBUTEROL SULFATE HFA 108 (90 BASE) MCG/ACT IN AERS: 2.0000 | INHALATION_SPRAY | RESPIRATORY_TRACT | 0 refills | Status: DC | PRN

## 2017-02-04 NOTE — ED Notes (Signed)
AAOx3.  Skin warm and dry.  NAD 

## 2017-02-04 NOTE — ED Triage Notes (Signed)
Pt arrived via ems from home with complaints of shortness of breath for the last few hours. Pt in on room air upon arrival with an 96% oxygen saturation. Pt reports feeling dizzy and states she has felt that was since her recent discharge. Pt alert and oriented x4.

## 2017-02-04 NOTE — ED Provider Notes (Signed)
[facility] Emergency Department Provider Note  ____________________________________________  Time seen: Approximately 7:17 AM  I have reviewed the triage vital signs and the nursing notes.   HISTORY  Chief Complaint Shortness of Breath    HPI Erin Hayes is a 51 y.o. female with a history of HTN, DM, without any known pulmonary disease, nonsmoker, presenting with cough and shortness of breath. The patient reports that 2 nights ago she noted that she was short of breath when laying down but this resolved if she sat up. This happened again last night, and this morning she developed a nonproductive cough with some congestion and rhinorrhea as well as left ear pain. She denies any sore throat, fevers or chills. No lower extremity swelling or calf pain. No nausea vomiting or diarrhea, myalgias, recent hospitalizations or sick contacts.   Past Medical History:  Diagnosis Date  . AKI (acute kidney injury) ([place]) 10/31/2016  . Diabetes ([place])   . GERD (gastroesophageal reflux disease)   . Hypertension   . Non-smoker     Patient Active Problem List   Diagnosis Date Noted  . Protein-calorie malnutrition, severe 11/02/2016  . AKI (acute kidney injury) ([place]) 10/31/2016  . Diarrhea 10/31/2016  . UTI (urinary tract infection) 10/31/2016  . Hypokalemia 04/28/2013  . Leukocytosis, unspecified 04/28/2013  . Encephalopathy 04/28/2013  . DKA (diabetic ketoacidoses) ([place]) 04/26/2013  . Diabetes ([place])   . Hypertension   . Hypercholesterolemia 11/10/2012  . Obesity 11/13/2011    Past Surgical History:  Procedure Laterality Date  . COLONOSCOPY WITH PROPOFOL N/A 01/21/2017   Procedure: COLONOSCOPY WITH PROPOFOL;  Surgeon: Jonathon Bellows, MD;  Location: [facility] ENDOSCOPY;  Service: Gastroenterology;  Laterality: N/A;  . ESOPHAGOGASTRODUODENOSCOPY (EGD) WITH PROPOFOL N/A 01/21/2017   Procedure: ESOPHAGOGASTRODUODENOSCOPY (EGD) WITH PROPOFOL;  Surgeon: Jonathon Bellows, MD;   Location: [facility] ENDOSCOPY;  Service: Gastroenterology;  Laterality: N/A;  . FLEXIBLE SIGMOIDOSCOPY N/A 11/04/2016   Procedure: FLEXIBLE SIGMOIDOSCOPY;  Surgeon: Wilford Corner, MD;  Location: [facility] ENDOSCOPY;  Service: Endoscopy;  Laterality: N/A;  . LITHOTRIPSY      Current Outpatient Rx  . Order #: [id number] Class: Print  . Order #: [id number] Class: Normal  . Order #: [id number] Class: Historical Med  . Order #: [id number] Class: Historical Med  . Order #: [id number] Class: Historical Med  . Order #: [id number] Class: Historical Med  . Order #: [id number] Class: Print  . Order #: [id number] Class: Normal  . Order #: [id number] Class: Historical Med  . Order #: [id number] Class: Print    Allergies Patient has no known allergies.  Family History  Problem Relation Age of Onset  . Hypertension Father     Social History Social History  Substance Use Topics  . Smoking status: Never Smoker  . Smokeless tobacco: Never Used  . Alcohol use No    Review of Systems Constitutional: No fever/chills.no lightheadedness or syncope. Eyes: No visual changes.no eye discharge. ENT: No sore throat. positivecongestion and rhinorrhea.positive left ear discomfort. Cardiovascular: Denies chest pain. Denies palpitations. Respiratory: positiveshortness of breath.  No cough. Gastrointestinal: No abdominal pain.  No nausea, no vomiting.  No diarrhea.  No constipation. Genitourinary: Negative for dysuria. Musculoskeletal: Negative for back pain.no lower extremity swelling or calf pain. Skin: Negative for rash. Neurological: Negative for headaches. No focal numbness, tingling or weakness.     ____________________________________________   PHYSICAL EXAM:  VITAL SIGNS: ED Triage Vitals  Enc Vitals Group     BP 02/04/17 0654 (!) 119/99     Pulse Rate 02/04/17 0654 (!) 137  Resp 02/04/17 0654 18     Temp 02/04/17 0654 98.1 F (36.7 C)     Temp Source 02/04/17 0654 Oral     SpO2 02/04/17 0654 100 %      Weight 02/04/17 0649 98 lb (44.5 kg)     Height 02/04/17 0649 4\' 11"  (1.499 m)     Head Circumference --      Peak Flow --      Pain Score --      Pain Loc --      Pain Edu? --      Excl. in [place]? --     Constitutional: Alert and oriented. Well appearing and in no acute distress. Answers questions appropriately. Eyes: Conjunctivae are normal.  EOMI. No scleral icterus. Head: Atraumatic. Nose: No congestion/rhinnorhea. Mouth/Throat: Mucous membranes are moist. Poor dentition diffusely. No posterior pharyngeal erythema, tonsillar swelling or exudate. The posterior palate is symmetric and uvula is midline. Neck: No stridor.  Supple.  No JVD. No meningismus. No submandibular lymphadenopathy. Cardiovascular: Normal rate, regular rhythm. No murmurs, rubs or gallops.  Respiratory: Normal respiratory effort.  No accessory muscle use or retractions. Lungs CTAB.  No wheezes or rhonchi but the patient does have rales in bilateral lower lung bases more prominently on the right. Gastrointestinal: Soft, nontender and nondistended.  No guarding or rebound.  No peritoneal signs. Musculoskeletal: No LE edema. No ttp in the calves or palpable cords.  Negative Homan's sign. Neurologic:  A&Ox3.  Speech is clear.  Face and smile are symmetric.  EOMI.  Moves all extremities well. Skin:  Skin is warm, dry and intact. No rash noted. Psychiatric: Mood and affect are normal. Speech and behavior are normal.  Normal judgement.  ____________________________________________   LABS (all labs ordered are listed, but only abnormal results are displayed)  Labs Reviewed  CBC - Abnormal; Notable for the following:       Result Value   Hemoglobin 11.6 (*)    HCT 34.8 (*)    All other components within normal limits  CBC WITH DIFFERENTIAL/PLATELET  URINALYSIS, COMPLETE (UACMP) WITH MICROSCOPIC  BRAIN NATRIURETIC PEPTIDE   ____________________________________________  EKG  ED ECG REPORT I, Eula Listen,  the attending physician, personally viewed and interpreted this ECG.   Date: 02/04/2017  EKG Time: 653  Rate: 105  Rhythm: sinus tachycardia  Axis: leftward  Intervals:prolonged Qtc  ST&T Change: no STEMI  ____________________________________________  RADIOLOGY  Dg Chest Portable 1 View  Result Date: 02/04/2017 CLINICAL DATA:  Shortness of breath. EXAM: PORTABLE CHEST 1 VIEW COMPARISON:  Radiographs of September 01, 2016. FINDINGS: Stable cardiomediastinal silhouette. No pneumothorax is noted. Left lung is clear. Mild right infrahilar atelectasis or infiltrate is noted. Minimal right pleural effusion is noted. Bony thorax is unremarkable. IMPRESSION: Mild right infrahilar atelectasis or infiltrate. Minimal right pleural effusion. Electronically Signed   By: Marijo Conception, M.D.   On: 02/04/2017 07:21    ____________________________________________   PROCEDURES  Procedure(s) performed: None  Procedures  Critical Care performed: No ____________________________________________   INITIAL IMPRESSION / ASSESSMENT AND PLAN / ED COURSE  Pertinent labs & imaging results that were available during my care of the patient were reviewed by me and considered in my medical decision making (see chart for details).  51 y.o. Female without any underlying pulmonary disease, presenting with orthopnea, now with congestion and rhinorrhea and cough. Upon review of the patient's medical records, she has no history of COPD or asthma. Overall, I am concerned about pneumonia particularly  in the right lung base. A viral URI can also cause his symptoms. There is no evidence of peripheral edema so CHF is much less likely. The patient has not been having any chest pain, and has no ischemic changes on her EKG so ACS or MI is very unlikely. At this time, we'll treat the patient with Levaquin for community-acquired pneumonia, and await the major of her results. I did interpret the chest x-ray myself which does showa  consolidation in the right lower lung base.  ----------------------------------------- 8:22 AM on 02/04/2017 -----------------------------------------  The patient is feeling significantly better at this time. She has poor IV access, and his able to tolerate oral liquids, so we will treat her with oral hydration here. She is receiving Levaquin at this time, and was able to ambulate maintaining oxygen saturations of greater than 97% on room air. I have reviewed the patient's official radiographic read, which does show a right base infiltrate with small effusion. I anticipate the patient will be safe for discharge home with oral treatment for her pneumonia and close PMD follow-up. Return precautions were discussed.  ____________________________________________  FINAL CLINICAL IMPRESSION(S) / ED DIAGNOSES  Final diagnoses:  Pneumonia of right lower lobe due to infectious organism ([place])  Pleural effusion on right         NEW MEDICATIONS STARTED DURING THIS VISIT:  New Prescriptions   ALBUTEROL (PROVENTIL HFA;VENTOLIN HFA) 108 (90 BASE) MCG/ACT INHALER    Inhale 2 puffs into the lungs every 4 (four) hours as needed for wheezing or shortness of breath.   LEVOFLOXACIN (LEVAQUIN) 750 MG TABLET    Take 1 tablet (750 mg total) by mouth daily.      Eula Listen, MD 02/04/17 [phone]

## 2017-02-04 NOTE — Discharge Instructions (Signed)
Please take the entire course of antibiotics, even if you're feeling better. You may takealbuterol for shortness of breath, wheezing, or cough spasms.  Please make an appointment with your primary care physician, or with a primary care physician at the [facility] clinic, for reevaluation in 3 days. Return to the emergency department if you develop severe pain, shortness of breath, fever, inability to keep down fluids, or any other symptoms concerning to you.

## 2017-02-04 NOTE — ED Notes (Signed)
Patient denies pain and is resting comfortably.  

## 2017-02-04 NOTE — ED Notes (Signed)
Ambulated to BR on room air.  No SOB/ DOE.  States headache resolved.  SpO2 98% on Room Air after ambulated.

## 2017-02-05 ENCOUNTER — Telehealth: Payer: Self-pay

## 2017-02-05 NOTE — Telephone Encounter (Signed)
-----   Message from Jonathon Bellows, MD sent at 02/03/2017  1:31 PM EDT ----- Inform   Normal duodenal bx, 1 adenoma so repeat colonoscopy in 5 years

## 2017-02-05 NOTE — Telephone Encounter (Signed)
No voicemail.   Mailing letter with result information per Dr. Vicente Males.

## 2017-02-13 ENCOUNTER — Telehealth: Payer: Self-pay | Admitting: Gastroenterology

## 2017-02-13 ENCOUNTER — Telehealth: Payer: Self-pay | Admitting: Pharmacy Technician

## 2017-02-13 NOTE — Telephone Encounter (Signed)
Patient eligible to receive medication assistance at [facility] through 2018, as long as eligibility requirements continue to be met.  Ruel Dimmick J. Jaedan Schuman Care Manager [facility] 

## 2017-02-13 NOTE — Telephone Encounter (Signed)
Patient is still having diarrhea since her colonoscopy. She wanted to discuss IBD with Dr. Vicente Males so I made her an appointment but needs to know what she can do in the mean time. She has diarrhea right after she eat. Please call

## 2017-02-14 ENCOUNTER — Telehealth: Payer: Self-pay

## 2017-02-14 NOTE — Telephone Encounter (Signed)
Patient states still having diarrhea. Wanted help until appt with Dr. Vicente Males.   Advised patient to try Imodium.

## 2017-02-18 ENCOUNTER — Emergency Department: Payer: Self-pay

## 2017-02-18 ENCOUNTER — Observation Stay
Admission: EM | Admit: 2017-02-18 | Discharge: 2017-02-20 | Disposition: A | Discharge status: Home / Self Care | Payer: Self-pay | Attending: Internal Medicine | Admitting: Internal Medicine

## 2017-02-18 DIAGNOSIS — R0602 Shortness of breath: Secondary | ICD-10-CM

## 2017-02-18 DIAGNOSIS — Z79899 Other long term (current) drug therapy: Secondary | ICD-10-CM | POA: Insufficient documentation

## 2017-02-18 DIAGNOSIS — E119 Type 2 diabetes mellitus without complications: Secondary | ICD-10-CM | POA: Insufficient documentation

## 2017-02-18 DIAGNOSIS — R000 Tachycardia, unspecified: Secondary | ICD-10-CM | POA: Insufficient documentation

## 2017-02-18 DIAGNOSIS — K219 Gastro-esophageal reflux disease without esophagitis: Secondary | ICD-10-CM | POA: Insufficient documentation

## 2017-02-18 DIAGNOSIS — Y95 Nosocomial condition: Secondary | ICD-10-CM | POA: Insufficient documentation

## 2017-02-18 DIAGNOSIS — E876 Hypokalemia: Secondary | ICD-10-CM | POA: Insufficient documentation

## 2017-02-18 DIAGNOSIS — Z7982 Long term (current) use of aspirin: Secondary | ICD-10-CM | POA: Insufficient documentation

## 2017-02-18 DIAGNOSIS — J189 Pneumonia, unspecified organism: Principal | ICD-10-CM | POA: Insufficient documentation

## 2017-02-18 DIAGNOSIS — I10 Essential (primary) hypertension: Secondary | ICD-10-CM | POA: Insufficient documentation

## 2017-02-18 DIAGNOSIS — Z7984 Long term (current) use of oral hypoglycemic drugs: Secondary | ICD-10-CM | POA: Insufficient documentation

## 2017-02-18 DIAGNOSIS — R0902 Hypoxemia: Secondary | ICD-10-CM | POA: Insufficient documentation

## 2017-02-18 HISTORY — DX: Pneumonia, unspecified organism: J18.9

## 2017-02-18 LAB — CBC
HEMATOCRIT: 32.7 % — AB (ref 35.0–47.0)
HEMOGLOBIN: 11 g/dL — AB (ref 12.0–16.0)
MCH: 28.5 pg (ref 26.0–34.0)
MCHC: 33.7 g/dL (ref 32.0–36.0)
MCV: 84.5 fL (ref 80.0–100.0)
Platelets: 328 10*3/uL (ref 150–440)
RBC: 3.88 MIL/uL (ref 3.80–5.20)
RDW: 12.4 % (ref 11.5–14.5)
WBC: 10.2 10*3/uL (ref 3.6–11.0)

## 2017-02-18 NOTE — ED Triage Notes (Signed)
Patient to triage via wheelchair by EMS from home.  Patient reports increasing shortness of breath today.  Patient with recent pneumonia diagnosis.  Per EMS initial pulse ox 87% on room air, up to 92-94% after duo neb.

## 2017-02-18 NOTE — ED Notes (Signed)
Pt reports that she had chest pain earlier today (no chest pain at this time) - pt reports a dry non-productive cough for the past 24 hours - pt reports shortness of breath (not observed)

## 2017-02-18 NOTE — ED Notes (Signed)
Dr Mariea Clonts notified of pt elevated BP - no new orders at this time

## 2017-02-19 ENCOUNTER — Encounter: Payer: Self-pay | Admitting: Internal Medicine

## 2017-02-19 ENCOUNTER — Other Ambulatory Visit: Payer: Self-pay

## 2017-02-19 DIAGNOSIS — J189 Pneumonia, unspecified organism: Secondary | ICD-10-CM

## 2017-02-19 HISTORY — DX: Pneumonia, unspecified organism: J18.9

## 2017-02-19 LAB — BASIC METABOLIC PANEL
ANION GAP: 6 (ref 5–15)
BUN: 11 mg/dL (ref 6–20)
CALCIUM: 8 mg/dL — AB (ref 8.9–10.3)
CO2: 26 mmol/L (ref 22–32)
Chloride: 104 mmol/L (ref 101–111)
Creatinine, Ser: 0.56 mg/dL (ref 0.44–1.00)
Glucose, Bld: 203 mg/dL — ABNORMAL HIGH (ref 65–99)
Potassium: 3.2 mmol/L — ABNORMAL LOW (ref 3.5–5.1)
Sodium: 136 mmol/L (ref 135–145)

## 2017-02-19 LAB — CBC
HCT: 33.1 % — ABNORMAL LOW (ref 35.0–47.0)
HEMOGLOBIN: 11 g/dL — AB (ref 12.0–16.0)
MCH: 28.2 pg (ref 26.0–34.0)
MCHC: 33.3 g/dL (ref 32.0–36.0)
MCV: 84.9 fL (ref 80.0–100.0)
Platelets: 291 10*3/uL (ref 150–440)
RBC: 3.9 MIL/uL (ref 3.80–5.20)
RDW: 12.4 % (ref 11.5–14.5)
WBC: 11.3 10*3/uL — AB (ref 3.6–11.0)

## 2017-02-19 LAB — COMPREHENSIVE METABOLIC PANEL
ALBUMIN: 3.2 g/dL — AB (ref 3.5–5.0)
ALT: 18 U/L (ref 14–54)
ANION GAP: 10 (ref 5–15)
AST: 13 U/L — ABNORMAL LOW (ref 15–41)
Alkaline Phosphatase: 74 U/L (ref 38–126)
BUN: 13 mg/dL (ref 6–20)
CHLORIDE: 103 mmol/L (ref 101–111)
CO2: 23 mmol/L (ref 22–32)
Calcium: 8.3 mg/dL — ABNORMAL LOW (ref 8.9–10.3)
Creatinine, Ser: 0.63 mg/dL (ref 0.44–1.00)
GFR calc non Af Amer: >60 mL/min (ref >60)
Glucose, Bld: 165 mg/dL — ABNORMAL HIGH (ref 65–99)
Potassium: 3.4 mmol/L — ABNORMAL LOW (ref 3.5–5.1)
SODIUM: 136 mmol/L (ref 135–145)
Total Bilirubin: 0.9 mg/dL (ref 0.3–1.2)
Total Protein: 6.5 g/dL (ref 6.5–8.1)

## 2017-02-19 LAB — LACTIC ACID, PLASMA: Lactic Acid, Venous: 1.2 mmol/L (ref 0.5–1.9)

## 2017-02-19 LAB — GLUCOSE, CAPILLARY
GLUCOSE-CAPILLARY: 161 mg/dL — AB (ref 65–99)
GLUCOSE-CAPILLARY: 140 mg/dL — AB (ref 65–99)

## 2017-02-19 LAB — TROPONIN I: Troponin I: <0.03 ng/mL (ref <0.03)

## 2017-02-19 MED ORDER — DEXTROSE 5 % IV SOLN
2.0000 g | Freq: Two times a day (BID) | INTRAVENOUS | Status: DC
  Administered 2017-02-19 (×2): 2 g via INTRAVENOUS
  Filled 2017-02-19 (×4): qty 2

## 2017-02-19 MED ORDER — ASPIRIN 81 MG PO CHEW
81.0000 mg | CHEWABLE_TABLET | Freq: Every day | ORAL | Status: DC
  Administered 2017-02-19 – 2017-02-20 (×2): 81 mg via ORAL
  Filled 2017-02-19 (×2): qty 1

## 2017-02-19 MED ORDER — AMLODIPINE BESYLATE 5 MG PO TABS
5.0000 mg | ORAL_TABLET | Freq: Every day | ORAL | Status: DC
  Administered 2017-02-19: 5 mg via ORAL
  Filled 2017-02-19: qty 1

## 2017-02-19 MED ORDER — SENNOSIDES-DOCUSATE SODIUM 8.6-50 MG PO TABS: 1.0000 | ORAL_TABLET | Freq: Every evening | ORAL | Status: DC | PRN

## 2017-02-19 MED ORDER — HEPARIN SODIUM (PORCINE) 5000 UNIT/ML IJ SOLN
5000.0000 [IU] | Freq: Three times a day (TID) | INTRAMUSCULAR | Status: DC
  Filled 2017-02-19 (×3): qty 1

## 2017-02-19 MED ORDER — GLIPIZIDE 10 MG PO TABS
10.0000 mg | ORAL_TABLET | Freq: Every day | ORAL | Status: DC
  Administered 2017-02-19 – 2017-02-20 (×2): 10 mg via ORAL
  Filled 2017-02-19 (×2): qty 1

## 2017-02-19 MED ORDER — ALBUTEROL SULFATE (2.5 MG/3ML) 0.083% IN NEBU
3.0000 mL | INHALATION_SOLUTION | RESPIRATORY_TRACT | Status: DC | PRN
  Administered 2017-02-19 (×2): 3 mL via RESPIRATORY_TRACT
  Filled 2017-02-19 (×2): qty 3

## 2017-02-19 MED ORDER — SODIUM CHLORIDE 0.9 % IV BOLUS (SEPSIS)
1000.0000 mL | Freq: Once | INTRAVENOUS | Status: AC
  Administered 2017-02-19: 1000 mL via INTRAVENOUS

## 2017-02-19 MED ORDER — ACETAMINOPHEN 325 MG PO TABS: 650.0000 mg | ORAL_TABLET | Freq: Four times a day (QID) | ORAL | Status: DC | PRN

## 2017-02-19 MED ORDER — HYDRALAZINE HCL 20 MG/ML IJ SOLN
10.0000 mg | INTRAMUSCULAR | Status: DC | PRN
  Administered 2017-02-19: 10 mg via INTRAVENOUS

## 2017-02-19 MED ORDER — VANCOMYCIN HCL IN DEXTROSE 1-5 GM/200ML-% IV SOLN
1000.0000 mg | Freq: Once | INTRAVENOUS | Status: AC
  Administered 2017-02-19: 1000 mg via INTRAVENOUS
  Filled 2017-02-19: qty 200

## 2017-02-19 MED ORDER — POTASSIUM CHLORIDE CRYS ER 20 MEQ PO TBCR
40.0000 meq | EXTENDED_RELEASE_TABLET | Freq: Once | ORAL | Status: AC
  Administered 2017-02-19: 40 meq via ORAL
  Filled 2017-02-19: qty 2

## 2017-02-19 MED ORDER — PIOGLITAZONE HCL 15 MG PO TABS
15.0000 mg | ORAL_TABLET | Freq: Every day | ORAL | Status: DC
  Administered 2017-02-19 – 2017-02-20 (×2): 15 mg via ORAL
  Filled 2017-02-19 (×2): qty 1

## 2017-02-19 MED ORDER — HYDRALAZINE HCL 20 MG/ML IJ SOLN
INTRAMUSCULAR | Status: AC
  Filled 2017-02-19: qty 1

## 2017-02-19 MED ORDER — VANCOMYCIN HCL IN DEXTROSE 750-5 MG/150ML-% IV SOLN
750.0000 mg | INTRAVENOUS | Status: DC
  Filled 2017-02-19: qty 150

## 2017-02-19 MED ORDER — LISINOPRIL 10 MG PO TABS
10.0000 mg | ORAL_TABLET | Freq: Every day | ORAL | Status: DC
  Administered 2017-02-19: 10 mg via ORAL
  Filled 2017-02-19: qty 1

## 2017-02-19 MED ORDER — CEFEPIME HCL 2 G IJ SOLR
2.0000 g | Freq: Once | INTRAMUSCULAR | Status: AC
  Administered 2017-02-19: 2 g via INTRAVENOUS
  Filled 2017-02-19 (×2): qty 2

## 2017-02-19 MED ORDER — ONDANSETRON HCL 4 MG PO TABS: 4.0000 mg | ORAL_TABLET | Freq: Four times a day (QID) | ORAL | Status: DC | PRN

## 2017-02-19 MED ORDER — ONDANSETRON HCL 4 MG/2ML IJ SOLN: 4.0000 mg | Freq: Four times a day (QID) | INTRAMUSCULAR | Status: DC | PRN

## 2017-02-19 MED ORDER — METOPROLOL TARTRATE 25 MG PO TABS
12.5000 mg | ORAL_TABLET | Freq: Two times a day (BID) | ORAL | Status: DC
  Administered 2017-02-19 – 2017-02-20 (×2): 12.5 mg via ORAL
  Filled 2017-02-19 (×3): qty 1

## 2017-02-19 MED ORDER — SODIUM CHLORIDE 0.9 % IV SOLN: Freq: Once | INTRAVENOUS | Status: AC

## 2017-02-19 MED ORDER — LOPERAMIDE HCL 2 MG PO CAPS
4.0000 mg | ORAL_CAPSULE | ORAL | Status: DC | PRN
Start: 2017-02-19 — End: 2017-02-20
  Administered 2017-02-19: 4 mg via ORAL
  Filled 2017-02-19 (×2): qty 2

## 2017-02-19 MED ORDER — SODIUM CHLORIDE 0.9 % IV SOLN
INTRAVENOUS | Status: DC
  Administered 2017-02-19: 03:00:00 via INTRAVENOUS

## 2017-02-19 MED ORDER — ACETAMINOPHEN 650 MG RE SUPP: 650.0000 mg | Freq: Four times a day (QID) | RECTAL | Status: DC | PRN

## 2017-02-19 MED ORDER — GUAIFENESIN 100 MG/5ML PO SOLN
5.0000 mL | ORAL | Status: DC | PRN
  Administered 2017-02-19 – 2017-02-20 (×6): 100 mg via ORAL
  Filled 2017-02-19 (×8): qty 5

## 2017-02-19 NOTE — ED Notes (Signed)
Called pharmacy for cefepime, prior to vanc administration

## 2017-02-19 NOTE — Progress Notes (Signed)
[facility] [place] NAME: Erin Hayes    MR#:  [id number]  DATE OF BIRTH:  1965-12-05  SUBJECTIVE:    REVIEW OF SYSTEMS:   ROS Tolerating Diet: Tolerating PT:   DRUG ALLERGIES:  No Known Allergies  VITALS:  Blood pressure 116/87, pulse (!) 106, temperature 99 F (37.2 C), temperature source Oral, resp. rate 20, weight 44.7 kg (98 lb 9.6 oz), SpO2 100 %.  PHYSICAL EXAMINATION:   Physical Exam  GENERAL:  51 y.o.-year-old patient lying in the bed with no acute distress.  EYES: Pupils equal, round, reactive to light and accommodation. No scleral icterus. Extraocular muscles intact.  HEENT: Head atraumatic, normocephalic. Oropharynx and nasopharynx clear.  NECK:  Supple, no jugular venous distention. No thyroid enlargement, no tenderness.  LUNGS: Normal breath sounds bilaterally, no wheezing, rales, rhonchi. No use of accessory muscles of respiration.  CARDIOVASCULAR: S1, S2 normal. No murmurs, rubs, or gallops.  ABDOMEN: Soft, nontender, nondistended. Bowel sounds present. No organomegaly or mass.  EXTREMITIES: No cyanosis, clubbing or edema b/l.    NEUROLOGIC: Cranial nerves II through XII are intact. No focal Motor or sensory deficits b/l.   PSYCHIATRIC:  patient is alert and oriented x 3.  SKIN: No obvious rash, lesion, or ulcer.   LABORATORY PANEL:  CBC  Recent Labs Lab 02/19/17 0432  WBC 11.3*  HGB 11.0*  HCT 33.1*  PLT 291    Chemistries   Recent Labs Lab 02/18/17 2322 02/19/17 0432  NA 136 136  K 3.4* 3.2*  CL 103 104  CO2 23 26  GLUCOSE 165* 203*  BUN 13 11  CREATININE 0.63 0.56  CALCIUM 8.3* 8.0*  AST 13*  --   ALT 18  --   ALKPHOS 74  --   BILITOT 0.9  --    Cardiac Enzymes  Recent Labs Lab 02/18/17 2322  TROPONINI <0.03   RADIOLOGY:  Dg Chest 2 View  Result Date: 02/18/2017 CLINICAL DATA:  51 y/o  F; shortness of breath. EXAM: CHEST  2 VIEW COMPARISON:  02/04/2017 chest  radiograph FINDINGS: Stable cardiomegaly. Left lower lobe basilar consolidation and effusion. Clear right lung. Bones are unremarkable. IMPRESSION: Left lower lobe basilar consolidation and small left pleural effusion likely represents pneumonia. Stable cardiomegaly. Electronically Signed   By: Kristine Garbe M.D.   On: 02/18/2017 20:40   ASSESSMENT AND PLAN:  Erin Hayes  is a 51 y.o. female with a known history of Diabetes mellitus, GERD, hypertension, pneumonia in the past presented to the emergency with shortness of breath and cough for the last few days. The cough is productive of yellowish phlegm. Patient has low-grade fever and chills and generalized body aches.  1. Left LL pneumonia -IV cefepime -afebrile, feels better today, sats stable on RA -BC negative so far -Incentive spirometer  2.DM-2 -SSI and Glipizide and actos  3. Mild tachycardia -metoprolol 12.5 mg bid -hold lisinopril   4. GERD -PPI   Case discussed with Care Management/Social Worker. Management plans discussed with the patient, family and they are in agreement.  CODE STATUS: FULL  DVT Prophylaxis: lovenox  TOTAL TIME TAKING CARE OF THIS PATIENT: *25* minutes.  >50% time spent on counselling and coordination of care  POSSIBLE D/C IN 1-2 DAYS, DEPENDING ON CLINICAL CONDITION.  Note: This dictation was prepared with Dragon dictation along with smaller phrase technology. Any transcriptional errors that result from this process are unintentional.  Onyekachi Gathright M.D on 02/19/2017 at 3:49 PM  Between 7am to 6pm - Pager - [phone]  After 6pm go to [email or web address] - password [email or web address]  [facility]  Office  [phone]  CC: Primary care physician; Patient, No Pcp PerPatient ID: Erin Hayes, female   DOB: [birth year], 51 y.o.   MRN: [id number]

## 2017-02-19 NOTE — Care Management (Signed)
Patient admitted for PNA.  Per previous assessment patient followed by [facility] PCP.  This RNCM has set up patient to pick her Medication Management in the past.  Previous admission patient had not completed the required paperwork for [facility], and patient was provided with new application.  RNCM following

## 2017-02-19 NOTE — ED Provider Notes (Signed)
[facility] Emergency Department Provider Note   ____________________________________________   First MD Initiated Contact with Patient 02/18/17 2305     (approximate)  I have reviewed the triage vital signs and the nursing notes.   HISTORY  Chief Complaint Shortness of Breath    HPI Erin Hayes is a 51 y.o. female Who comes into the hospital today with difficulty breathing and coughing. She reports that she had a lot of problems with it this morning. The patient was diagnosed with pneumonia couple of weeks ago and reports that she completed her antibiotics but the symptoms started again this morning. The patient states that she has not taken anything for her cough. She does have an inhaler which she has been using but it has not helped with her shortness of breath. The patient denies any fevers but reports that she has mid chest pain whenever she coughs. The patient reports that it is a nonproductive cough. She has no headache or blurred vision. EMS brought the patient in and they stated that her O2 sats were 87% when they picked her up. The patient is here today for evaluation of her symptoms.   Past Medical History:  Diagnosis Date  . AKI (acute kidney injury) ([place]) 10/31/2016  . Diabetes ([place])   . GERD (gastroesophageal reflux disease)   . Hypertension   . Non-smoker   . Pneumonia 02/19/2017    Patient Active Problem List   Diagnosis Date Noted  . Pneumonia 02/19/2017  . Protein-calorie malnutrition, severe 11/02/2016  . AKI (acute kidney injury) ([place]) 10/31/2016  . Diarrhea 10/31/2016  . UTI (urinary tract infection) 10/31/2016  . Hypokalemia 04/28/2013  . Leukocytosis, unspecified 04/28/2013  . Encephalopathy 04/28/2013  . DKA (diabetic ketoacidoses) ([place]) 04/26/2013  . Diabetes ([place])   . Hypertension   . Hypercholesterolemia 11/10/2012  . Obesity 11/13/2011    Past Surgical History:  Procedure Laterality Date  . COLONOSCOPY WITH  PROPOFOL N/A 01/21/2017   Procedure: COLONOSCOPY WITH PROPOFOL;  Surgeon: Jonathon Bellows, MD;  Location: [facility] ENDOSCOPY;  Service: Gastroenterology;  Laterality: N/A;  . ESOPHAGOGASTRODUODENOSCOPY (EGD) WITH PROPOFOL N/A 01/21/2017   Procedure: ESOPHAGOGASTRODUODENOSCOPY (EGD) WITH PROPOFOL;  Surgeon: Jonathon Bellows, MD;  Location: [facility] ENDOSCOPY;  Service: Gastroenterology;  Laterality: N/A;  . FLEXIBLE SIGMOIDOSCOPY N/A 11/04/2016   Procedure: FLEXIBLE SIGMOIDOSCOPY;  Surgeon: Wilford Corner, MD;  Location: [facility] ENDOSCOPY;  Service: Endoscopy;  Laterality: N/A;  . LITHOTRIPSY      Prior to Admission medications   Medication Sig Start Date End Date Taking? Authorizing Provider  albuterol (PROVENTIL HFA;VENTOLIN HFA) 108 (90 Base) MCG/ACT inhaler Inhale 2 puffs into the lungs every 4 (four) hours as needed for wheezing or shortness of breath. 02/04/17   Eula Listen, MD  amLODipine (NORVASC) 5 MG tablet Take 1 tablet (5 mg total) by mouth daily. Patient not taking: Reported on 02/04/2017 01/10/17   Jerrol Banana., MD  aspirin 81 MG tablet Take 81 mg by mouth daily.    [provider]  Blood Glucose Monitoring Suppl (FIFTY50 GLUCOSE METER 2.0) w/Device KIT Frequency:PHARMDIR   Dosage:0.0     Instructions:  Note:use as directed by [place] (up to four times daily) Dose: 1 LANCET 05/13/13   [provider]  glipiZIDE (GLUCOTROL) 10 MG tablet Take 10 mg by mouth. 06/04/14   [provider]  glucose blood (BAYER CONTOUR TEST) test strip by Other route Two (2) times a day. 05/13/13   [provider]  lisinopril (  PRINIVIL,ZESTRIL) 10 MG tablet Take 1 tablet (10 mg total) by mouth daily. Patient not taking: Reported on 02/04/2017 01/10/17   Jerrol Banana., MD  lisinopril-hydrochlorothiazide (PRINZIDE,ZESTORETIC) 20-25 MG tablet TAKE ONE TABLET BY MOUTH ONCE DAILY 06/04/14   [provider]  loperamide (IMODIUM) 2 MG capsule Take 2 capsules (4  mg total) by mouth as needed for diarrhea or loose stools. 11/06/16   Dustin Flock, MD    Allergies Patient has no known allergies.  Family History  Problem Relation Age of Onset  . Hypertension Father     Social History Social History  Substance Use Topics  . Smoking status: Never Smoker  . Smokeless tobacco: Never Used  . Alcohol use No    Review of Systems  Constitutional: No fever/chills Eyes: No visual changes. ENT: No sore throat. Cardiovascular:  chest pain. Respiratory: cough and shortness of breath. Gastrointestinal: No abdominal pain.  No nausea, no vomiting.  No diarrhea.  No constipation. Genitourinary: Negative for dysuria. Musculoskeletal: Negative for back pain. Skin: Negative for rash. Neurological: Negative for headaches, focal weakness or numbness.   ____________________________________________   PHYSICAL EXAM:  VITAL SIGNS: ED Triage Vitals  Enc Vitals Group     BP 02/18/17 2010 (!) 151/109     Pulse Rate 02/18/17 2010 (!) 114     Resp 02/18/17 2010 (!) 26     Temp 02/18/17 2010 99.9 F (37.7 C)     Temp Source 02/18/17 2010 Oral     SpO2 02/18/17 2010 99 %     Weight --      Height --      Head Circumference --      Peak Flow --      Pain Score 02/18/17 2238 0     Pain Loc --      Pain Edu? --      Excl. in [place]? --     Constitutional: Alert and oriented. Well appearing and in mild distress. Eyes: Conjunctivae are normal. PERRL. EOMI. Head: Atraumatic. Nose: No congestion/rhinnorhea. Mouth/Throat: Mucous membranes are moist.  Oropharynx non-erythematous. Cardiovascular: Tachycardia, regular rhythm. Grossly normal heart sounds.  Good peripheral circulation. Respiratory: Normal respiratory effort.  No retractions. Lungs CTAB. Gastrointestinal: Soft and nontender. No distention. positive bowel sounds Musculoskeletal: No lower extremity tenderness nor edema.   Neurologic:  Normal speech and language.  Skin:  Skin is warm, dry and  intact.  Psychiatric: Mood and affect are normal.   ____________________________________________   LABS (all labs ordered are listed, but only abnormal results are displayed)  Labs Reviewed  CBC - Abnormal; Notable for the following:       Result Value   Hemoglobin 11.0 (*)    HCT 32.7 (*)    All other components within normal limits  COMPREHENSIVE METABOLIC PANEL - Abnormal; Notable for the following:    Potassium 3.4 (*)    Glucose, Bld 165 (*)    Calcium 8.3 (*)    Albumin 3.2 (*)    AST 13 (*)    All other components within normal limits  CULTURE, BLOOD (ROUTINE X 2)  CULTURE, BLOOD (ROUTINE X 2)  TROPONIN I  LACTIC ACID, PLASMA  LACTIC ACID, PLASMA   ____________________________________________  EKG  ED ECG REPORT I, Loney Hering, the attending physician, personally viewed and interpreted this ECG.   Date: 02/19/2017  EKG Time: 0012  Rate: 103  Rhythm: sinus tachycardia  Axis: normal  Intervals:none  ST&T Change: none  ____________________________________________  RADIOLOGY  Dg Chest 2 View  Result Date: 02/18/2017 CLINICAL DATA:  51 y/o  F; shortness of breath. EXAM: CHEST  2 VIEW COMPARISON:  02/04/2017 chest radiograph FINDINGS: Stable cardiomegaly. Left lower lobe basilar consolidation and effusion. Clear right lung. Bones are unremarkable. IMPRESSION: Left lower lobe basilar consolidation and small left pleural effusion likely represents pneumonia. Stable cardiomegaly. Electronically Signed   By: Kristine Garbe M.D.   On: 02/18/2017 20:40    ____________________________________________   PROCEDURES  Procedure(s) performed: None  Procedures  Critical Care performed: No  ____________________________________________   INITIAL IMPRESSION / ASSESSMENT AND PLAN / ED COURSE  As part of my medical decision making, I reviewed the following data within the electronic MEDICAL RECORD NUMBER Notes from prior ED visits and [place] Controlled  Substance Database   This is a 51 year old female who comes into the hospital today with some shortness of breath and coughing.  The differential diagnosis for this patient includes bronchitis, upper respiratory infection, pneumonia  The patient did receive a chest x-ray and it was found that the patient has a left-sided lower lobe pneumonia with effusion. We check some blood work on the patient and the patient's blood work looks unremarkable. She states that she does still feel short of breath although she is sitting comfortably on the stretcher. Patient is tachycardic and mildly tachypneic. Given the patient's recent history of pneumonia as well as an admission in September I will treat the patient with cefepime and vancomycin for nosocomial acquired pneumonia. I will admit the patient to the hospitalist service given her hypoxic event with EMS.      ____________________________________________   FINAL CLINICAL IMPRESSION(S) / ED DIAGNOSES  Final diagnoses:  HCAP (healthcare-associated pneumonia)  Hypoxia  SOB (shortness of breath)      NEW MEDICATIONS STARTED DURING THIS VISIT:  New Prescriptions   No medications on file     Note:  This document was prepared using Dragon voice recognition software and may include unintentional dictation errors.    Loney Hering, MD 02/19/17 [phone]

## 2017-02-19 NOTE — Progress Notes (Signed)
Pharmacy Antibiotic Note  Erin Hayes is a 51 y.o. female admitted on 02/18/2017 with pneumonia.  Pharmacy has been consulted for vancomycin and cefepime dosing.  Plan: DW 45kg  Vd 32L kei 0.05 hr-1  T1/2 14 hours Vancomycin 750 mg q 18 hours ordered with stacked dosing. Level before 5th dose. Goal trough 15-20.  Cefepime 2 grams q 12 hours ordered.  Weight: 98 lb 9.6 oz (44.7 kg)  Temp (24hrs), Avg:99.3 F (37.4 C), Min:98.7 F (37.1 C), Max:99.9 F (37.7 C)   Recent Labs Lab 02/18/17 2322  WBC 10.2  CREATININE 0.63  LATICACIDVEN 1.2    Estimated Creatinine Clearance: 55.5 mL/min (by C-G formula based on SCr of 0.63 mg/dL).    No Known Allergies  Antimicrobials this admission: Vancomycin, cefepime 10/15  >>    >>   Dose adjustments this admission:   Microbiology results: 10/15 BCx: pending      10/15 LLL basilar consolidation  Thank you for allowing pharmacy to be a part of this patient's care.  Minerva Bluett S 02/19/2017 3:49 AM

## 2017-02-19 NOTE — ED Notes (Signed)
Pt provided sandwich tray 

## 2017-02-19 NOTE — H&P (Signed)
[facility] NAME: Erin Hayes    MR#:  [id number]  DATE OF BIRTH:  1965/05/16  DATE OF ADMISSION:  02/18/2017  PRIMARY CARE PHYSICIAN: Patient, No Pcp Per   REQUESTING/REFERRING PHYSICIAN:   CHIEF COMPLAINT:   Chief Complaint  Patient presents with  . Shortness of Breath    HISTORY OF PRESENT ILLNESS: Erin Hayes  is a 51 y.o. female with a known history of Diabetes mellitus, GERD, hypertension, pneumonia in the past presented to the emergency with shortness of breath and cough for the last few days. The cough is productive of yellowish phlegm. Patient has low-grade fever and chills and generalized body aches. She was recently treated for right lung pneumonia the first week of October. She was worked up today with a chest x-ray which showed a new pneumonia and left lung. No complaints of any chest pain. No history of recent travel and sick contacts at home.  PAST MEDICAL HISTORY:   Past Medical History:  Diagnosis Date  . AKI (acute kidney injury) ([place]) 10/31/2016  . Diabetes ([place])   . GERD (gastroesophageal reflux disease)   . Hypertension   . Non-smoker   . Pneumonia 02/19/2017    PAST SURGICAL HISTORY: Past Surgical History:  Procedure Laterality Date  . COLONOSCOPY WITH PROPOFOL N/A 01/21/2017   Procedure: COLONOSCOPY WITH PROPOFOL;  Surgeon: Jonathon Bellows, MD;  Location: [facility] ENDOSCOPY;  Service: Gastroenterology;  Laterality: N/A;  . ESOPHAGOGASTRODUODENOSCOPY (EGD) WITH PROPOFOL N/A 01/21/2017   Procedure: ESOPHAGOGASTRODUODENOSCOPY (EGD) WITH PROPOFOL;  Surgeon: Jonathon Bellows, MD;  Location: [facility] ENDOSCOPY;  Service: Gastroenterology;  Laterality: N/A;  . FLEXIBLE SIGMOIDOSCOPY N/A 11/04/2016   Procedure: FLEXIBLE SIGMOIDOSCOPY;  Surgeon: Wilford Corner, MD;  Location: [facility] ENDOSCOPY;  Service: Endoscopy;  Laterality: N/A;  . LITHOTRIPSY      SOCIAL HISTORY:  Social History  Substance Use Topics  . Smoking  status: Never Smoker  . Smokeless tobacco: Never Used  . Alcohol use No    FAMILY HISTORY:  Family History  Problem Relation Age of Onset  . Hypertension Father   . COPD Neg Hx   . Diabetes Mellitus II Neg Hx     DRUG ALLERGIES: No Known Allergies  REVIEW OF SYSTEMS:   CONSTITUTIONAL: low garde fever, fatigue and weakness.  EYES: No blurred or double vision.  EARS, NOSE, AND THROAT: No tinnitus or ear pain.  RESPIRATORY: Has cough, shortness of breath, No wheezing or hemoptysis.  CARDIOVASCULAR: No chest pain, orthopnea, edema.  GASTROINTESTINAL: No nausea, vomiting, diarrhea or abdominal pain.  GENITOURINARY: No dysuria, hematuria.  ENDOCRINE: No polyuria, nocturia,  HEMATOLOGY: No anemia, easy bruising or bleeding SKIN: No rash or lesion. MUSCULOSKELETAL: No joint pain or arthritis.   NEUROLOGIC: No tingling, numbness, weakness.  PSYCHIATRY: No anxiety or depression.   MEDICATIONS AT HOME:  Prior to Admission medications   Medication Sig Start Date End Date Taking? Authorizing Provider  albuterol (PROVENTIL HFA;VENTOLIN HFA) 108 (90 Base) MCG/ACT inhaler Inhale 2 puffs into the lungs every 4 (four) hours as needed for wheezing or shortness of breath. 02/04/17   Eula Listen, MD  amLODipine (NORVASC) 5 MG tablet Take 1 tablet (5 mg total) by mouth daily. Patient not taking: Reported on 02/04/2017 01/10/17   Jerrol Banana., MD  aspirin 81 MG tablet Take 81 mg by mouth daily.    [provider]  Blood Glucose Monitoring Suppl (FIFTY50 GLUCOSE METER 2.0) w/Device KIT Frequency:PHARMDIR   Dosage:0.0  Instructions:  Note:use as directed by [place] (up to four times daily) Dose: 1 LANCET 05/13/13   [provider]  glipiZIDE (GLUCOTROL) 10 MG tablet Take 10 mg by mouth. 06/04/14   [provider]  glucose blood (BAYER CONTOUR TEST) test strip by Other route Two (2) times a day. 05/13/13   [provider]  lisinopril  (PRINIVIL,ZESTRIL) 10 MG tablet Take 1 tablet (10 mg total) by mouth daily. Patient not taking: Reported on 02/04/2017 01/10/17   Jerrol Banana., MD  lisinopril-hydrochlorothiazide (PRINZIDE,ZESTORETIC) 20-25 MG tablet TAKE ONE TABLET BY MOUTH ONCE DAILY 06/04/14   [provider]  loperamide (IMODIUM) 2 MG capsule Take 2 capsules (4 mg total) by mouth as needed for diarrhea or loose stools. 11/06/16   Dustin Flock, MD      PHYSICAL EXAMINATION:   VITAL SIGNS: Blood pressure (!) 140/110, pulse (!) 101, temperature 99.9 F (37.7 C), temperature source Oral, resp. rate (!) 25, SpO2 96 %.  GENERAL:  51 y.o.-year-old patient lying in the bed with no acute distress.  EYES: Pupils equal, round, reactive to light and accommodation. No scleral icterus. Extraocular muscles intact.  HEENT: Head atraumatic, normocephalic. Oropharynx and nasopharynx clear.  NECK:  Supple, no jugular venous distention. No thyroid enlargement, no tenderness.  LUNGS: Decreased breath sounds bilaterally, rales heard in left lung. No use of accessory muscles of respiration.  CARDIOVASCULAR: S1, S2 tachycardia noted. No murmurs, rubs, or gallops.  ABDOMEN: Soft, nontender, nondistended. Bowel sounds present. No organomegaly or mass.  EXTREMITIES: No pedal edema, cyanosis, or clubbing.  NEUROLOGIC: Cranial nerves II through XII are intact. Muscle strength 5/5 in all extremities. Sensation intact. Gait not checked.  PSYCHIATRIC: The patient is alert and oriented x 3.  SKIN: No obvious rash, lesion, or ulcer.   LABORATORY PANEL:   CBC  Recent Labs Lab 02/18/17 2322  WBC 10.2  HGB 11.0*  HCT 32.7*  PLT 328  MCV 84.5  MCH 28.5  MCHC 33.7  RDW 12.4   ------------------------------------------------------------------------------------------------------------------  Chemistries   Recent Labs Lab 02/18/17 2322  NA 136  K 3.4*  CL 103  CO2 23  GLUCOSE 165*  BUN 13  CREATININE 0.63  CALCIUM  8.3*  AST 13*  ALT 18  ALKPHOS 74  BILITOT 0.9   ------------------------------------------------------------------------------------------------------------------ CrCl cannot be calculated (Unknown ideal weight.). ------------------------------------------------------------------------------------------------------------------ No results for input(s): TSH, T4TOTAL, T3FREE, THYROIDAB in the last 72 hours.  Invalid input(s): FREET3   Coagulation profile No results for input(s): INR, PROTIME in the last 168 hours. ------------------------------------------------------------------------------------------------------------------- No results for input(s): DDIMER in the last 72 hours. -------------------------------------------------------------------------------------------------------------------  Cardiac Enzymes  Recent Labs Lab 02/18/17 2322  TROPONINI <0.03   ------------------------------------------------------------------------------------------------------------------ Invalid input(s): POCBNP  ---------------------------------------------------------------------------------------------------------------  Urinalysis    Component Value Date/Time   COLORURINE YELLOW (A) 10/31/2016 1949   APPEARANCEUR HAZY (A) 10/31/2016 1949   APPEARANCEUR Clear 04/25/2013 2004   LABSPEC 1.016 10/31/2016 1949   LABSPEC 1.025 04/25/2013 2004   PHURINE 6.0 10/31/2016 1949   GLUCOSEU NEGATIVE 10/31/2016 1949   GLUCOSEU >=500 04/25/2013 2004   HGBUR NEGATIVE 10/31/2016 1949   BILIRUBINUR NEGATIVE 10/31/2016 1949   BILIRUBINUR Negative 04/25/2013 2004   KETONESUR 5 (A) 10/31/2016 1949   PROTEINUR 100 (A) 10/31/2016 1949   NITRITE NEGATIVE 10/31/2016 1949   LEUKOCYTESUR LARGE (A) 10/31/2016 1949   LEUKOCYTESUR Negative 04/25/2013 2004     RADIOLOGY: Dg Chest 2 View  Result Date: 02/18/2017 CLINICAL DATA:  51 y/o  F;  shortness of breath. EXAM: CHEST  2 VIEW COMPARISON:  02/04/2017  chest radiograph FINDINGS: Stable cardiomegaly. Left lower lobe basilar consolidation and effusion. Clear right lung. Bones are unremarkable. IMPRESSION: Left lower lobe basilar consolidation and small left pleural effusion likely represents pneumonia. Stable cardiomegaly. Electronically Signed   By: Kristine Garbe M.D.   On: 02/18/2017 20:40    EKG: Orders placed or performed during the hospital encounter of 02/18/17  . ED EKG  . ED EKG    IMPRESSION AND PLAN: 51 year old female patient with history of diabetes mellitus, GERD, hypertension presented to the emergency room with shortness of breath, cough, fever and chills.  Admitting diagnosis 1. Healthcare associated pneumonia 2. Hypokalemia 3. Type 2 diabetes mellitus 4. GERD 5. Hypertension Treatment plan Admit patient to medical floor Start patient on IV vancomycin and IV cefepime antibiotic IV fluids Replace potassium DVT prophylaxis with subcutaneous heparin Follow-up cultures All the records are reviewed and case discussed with ED provider. Management plans discussed with the patient, family and they are in agreement.  CODE STATUS:FULL CODE Code Status History    Date Active Date Inactive Code Status Order ID Comments User Context   10/31/2016  9:59 PM 11/07/2016  5:41 PM Full Code [id number]  Lance Coon, MD Inpatient   04/26/2013  1:40 AM 04/29/2013  9:57 PM Full Code [id number]  Guy Begin, MD Inpatient       TOTAL TIME TAKING CARE OF THIS PATIENT: 54 minutes.    Saundra Shelling M.D on 02/19/2017 at 1:41 AM  Between 7am to 6pm - Pager - [phone]  After 6pm go to [email or web address] - password [email or web address] [place] [facility]  Office  [phone]  CC: Primary care physician; Patient, No Pcp Per

## 2017-02-20 MED ORDER — LEVOFLOXACIN 500 MG PO TABS: 500.0000 mg | ORAL_TABLET | Freq: Every day | ORAL | 0 refills | Status: DC

## 2017-02-20 MED ORDER — LEVOFLOXACIN 500 MG PO TABS: 500.0000 mg | ORAL_TABLET | Freq: Every day | ORAL | Status: DC

## 2017-02-20 MED ORDER — METOPROLOL TARTRATE 25 MG PO TABS: 25.0000 mg | ORAL_TABLET | Freq: Two times a day (BID) | ORAL | Status: DC

## 2017-02-20 MED ORDER — CEFPODOXIME PROXETIL 200 MG PO TABS
200.0000 mg | ORAL_TABLET | Freq: Two times a day (BID) | ORAL | Status: DC
  Administered 2017-02-20: 200 mg via ORAL
  Filled 2017-02-20: qty 1

## 2017-02-20 MED ORDER — CEFPODOXIME PROXETIL 200 MG PO TABS: 200.0000 mg | ORAL_TABLET | Freq: Two times a day (BID) | ORAL | 0 refills | Status: DC

## 2017-02-20 MED ORDER — METOPROLOL TARTRATE 25 MG PO TABS: 25.0000 mg | ORAL_TABLET | Freq: Two times a day (BID) | ORAL | 2 refills | Status: DC

## 2017-02-20 MED ORDER — METOPROLOL TARTRATE 25 MG PO TABS
12.5000 mg | ORAL_TABLET | Freq: Once | ORAL | Status: AC
  Administered 2017-02-20: 12.5 mg via ORAL
  Filled 2017-02-20: qty 1

## 2017-02-20 NOTE — Discharge Summary (Addendum)
[facility] [place] NAME: Erin Hayes    MR#:  [id number]  DATE OF BIRTH:  09-Jan-1966  DATE OF ADMISSION:  02/18/2017 ADMITTING PHYSICIAN: Saundra Shelling, MD  DATE OF DISCHARGE: 02/20/17  PRIMARY CARE PHYSICIAN: Patient, No Pcp Per    ADMISSION DIAGNOSIS:  Diabetes mellitus without complication ([place]) [F64.3] SOB (shortness of breath) [R06.02] Hypoxia [R09.02] HCAP (healthcare-associated pneumonia) [J18.9]  DISCHARGE DIAGNOSIS:  Left LL pneumonia  SECONDARY DIAGNOSIS:   Past Medical History:  Diagnosis Date  . AKI (acute kidney injury) ([place]) 10/31/2016  . Diabetes ([place])   . GERD (gastroesophageal reflux disease)   . Hypertension   . Non-smoker   . Pneumonia 02/19/2017    HOSPITAL COURSE:  Erin Hayes a 51 y.o.femalewith a known history of Diabetes mellitus, GERD, hypertension, pneumonia in the past presented to the emergency with shortness of breath and cough for the last few days. The cough is productive of yellowish phlegm. Patient has low-grade fever and chills and generalized body aches.  1. Left LL pneumonia -IV cefepime--change to po levaquin -afebrile, feels better today, sats stable on RA -BC negative so far -Incentive spirometer  2.DM-2 -SSI and Glipizide and actos  3. Mild tachycardia -metoprolol 12.5 mg bid -hold lisinopril   4. GERD -PPI  Overall well. D/c home. Pt agreeable CONSULTS OBTAINED:    DRUG ALLERGIES:  No Known Allergies  DISCHARGE MEDICATIONS:   Current Discharge Medication List    START taking these medications   Details  levofloxacin (LEVAQUIN) 500 MG tablet Take 1 tablet (500 mg total) by mouth daily. Qty: 5 tablet, Refills: 0    metoprolol tartrate (LOPRESSOR) 25 MG tablet Take 1 tablet (25 mg total) by mouth 2 (two) times daily. Qty: 60 tablet, Refills: 2      CONTINUE these medications which have NOT CHANGED   Details  aspirin 81 MG tablet Take 81 mg  by mouth daily.    glipiZIDE (GLUCOTROL) 10 MG tablet Take 10 mg by mouth.   Associated Diagnoses: Diarrhea, unspecified type    lisinopril (PRINIVIL,ZESTRIL) 10 MG tablet Take 1 tablet (10 mg total) by mouth daily. Qty: 30 tablet, Refills: 5   Associated Diagnoses: Hypertension, unspecified type    albuterol (PROVENTIL HFA;VENTOLIN HFA) 108 (90 Base) MCG/ACT inhaler Inhale 2 puffs into the lungs every 4 (four) hours as needed for wheezing or shortness of breath. Qty: 1 Inhaler, Refills: 0    Blood Glucose Monitoring Suppl (FIFTY50 GLUCOSE METER 2.0) w/Device KIT Frequency:PHARMDIR   Dosage:0.0     Instructions:  Note:use as directed by [place] (up to four times daily) Dose: 1 LANCET   Associated Diagnoses: Diarrhea, unspecified type    glucose blood (BAYER CONTOUR TEST) test strip by Other route Two (2) times a day.   Associated Diagnoses: Diarrhea, unspecified type    loperamide (IMODIUM) 2 MG capsule Take 2 capsules (4 mg total) by mouth as needed for diarrhea or loose stools. Qty: 30 capsule, Refills: 0      STOP taking these medications     amLODipine (NORVASC) 5 MG tablet         If you experience worsening of your admission symptoms, develop shortness of breath, life threatening emergency, suicidal or homicidal thoughts you must seek medical attention immediately by calling 911 or calling your MD immediately  if symptoms less severe.  You Must read complete instructions/literature along with all the possible adverse reactions/side effects for all the Medicines you take  and that have been prescribed to you. Take any new Medicines after you have completely understood and accept all the possible adverse reactions/side effects.   Please note  You were cared for by a hospitalist during your hospital stay. If you have any questions about your discharge medications or the care you received while you were in the hospital after you are discharged, you can call the unit  and asked to speak with the hospitalist on call if the hospitalist that took care of you is not available. Once you are discharged, your primary care physician will handle any further medical issues. Please note that NO REFILLS for any discharge medications will be authorized once you are discharged, as it is imperative that you return to your primary care physician (or establish a relationship with a primary care physician if you do not have one) for your aftercare needs so that they can reassess your need for medications and monitor your lab values. Today   SUBJECTIVE   Dry cough  VITAL SIGNS:  Blood pressure 123/82, pulse (!) 110, temperature 98.4 F (36.9 C), temperature source Oral, resp. rate 18, height _0  (1.499 m), weight 43.8 kg (96 lb 9.6 oz), SpO2 98 %.  I/O:    Intake/Output Summary (Last 24 hours) at 02/20/17 1008 Last data filed at 02/20/17 0700  Gross per 24 hour  Intake              940 ml  Output              800 ml  Net              140 ml    PHYSICAL EXAMINATION:  GENERAL:  51 y.o.-year-old patient lying in the bed with no acute distress.  EYES: Pupils equal, round, reactive to light and accommodation. No scleral icterus. Extraocular muscles intact.  HEENT: Head atraumatic, normocephalic. Oropharynx and nasopharynx clear.  NECK:  Supple, no jugular venous distention. No thyroid enlargement, no tenderness.  LUNGS: Normal breath sounds bilaterally, no wheezing, rales,rhonchi or crepitation. No use of accessory muscles of respiration.  CARDIOVASCULAR: S1, S2 normal. No murmurs, rubs, or gallops.  ABDOMEN: Soft, non-tender, non-distended. Bowel sounds present. No organomegaly or mass.  EXTREMITIES: No pedal edema, cyanosis, or clubbing.  NEUROLOGIC: Cranial nerves II through XII are intact. Muscle strength 5/5 in all extremities. Sensation intact. Gait not checked.  PSYCHIATRIC: The patient is alert and oriented x 3.  SKIN: No obvious rash, lesion, or ulcer.    DATA REVIEW:   CBC   Recent Labs Lab 02/19/17 0432  WBC 11.3*  HGB 11.0*  HCT 33.1*  PLT 291    Chemistries   Recent Labs Lab 02/18/17 2322 02/19/17 0432  NA 136 136  K 3.4* 3.2*  CL 103 104  CO2 23 26  GLUCOSE 165* 203*  BUN 13 11  CREATININE 0.63 0.56  CALCIUM 8.3* 8.0*  AST 13*  --   ALT 18  --   ALKPHOS 74  --   BILITOT 0.9  --     Microbiology Results   Recent Results (from the past 240 hour(s))  Blood culture (routine x 2)     Status: None (Preliminary result)   Collection Time: 02/18/17 11:22 PM  Result Value Ref Range Status   Specimen Description BLOOD RT HAND  Final   Special Requests   Final    BOTTLES DRAWN AEROBIC AND ANAEROBIC Blood Culture adequate volume   Culture NO GROWTH 2 DAYS  Final   Report Status PENDING  Incomplete  Blood culture (routine x 2)     Status: None (Preliminary result)   Collection Time: 02/18/17 11:22 PM  Result Value Ref Range Status   Specimen Description BLOOD LT HAND  Final   Special Requests   Final    BOTTLES DRAWN AEROBIC AND ANAEROBIC Blood Culture adequate volume   Culture NO GROWTH 2 DAYS  Final   Report Status PENDING  Incomplete    RADIOLOGY:  Dg Chest 2 View  Result Date: 02/18/2017 CLINICAL DATA:  51 y/o  F; shortness of breath. EXAM: CHEST  2 VIEW COMPARISON:  02/04/2017 chest radiograph FINDINGS: Stable cardiomegaly. Left lower lobe basilar consolidation and effusion. Clear right lung. Bones are unremarkable. IMPRESSION: Left lower lobe basilar consolidation and small left pleural effusion likely represents pneumonia. Stable cardiomegaly. Electronically Signed   By: Kristine Garbe M.D.   On: 02/18/2017 20:40     Management plans discussed with the patient, family and they are in agreement.  CODE STATUS:     Code Status Orders        Start     Ordered   02/19/17 0204  Full code  Continuous     02/19/17 0203    Code Status History    Date Active Date Inactive Code Status Order  ID Comments User Context   10/31/2016  9:59 PM 11/07/2016  5:41 PM Full Code [id number]  Lance Coon, MD Inpatient   04/26/2013  1:40 AM 04/29/2013  9:57 PM Full Code [id number]  Guy Begin, MD Inpatient      TOTAL TIME TAKING CARE OF THIS PATIENT: *40* minutes.    Lamar Naef M.D on 02/20/2017 at 10:08 AM  Between 7am to 6pm - Pager - [phone] After 6pm go to [email or web address] - password [email or web address] [facility]  Office  [phone]  CC: Primary care physician; Patient, No Pcp Per

## 2017-02-20 NOTE — Care Management (Signed)
Patient to discharge with albuterol, Levaquin, and lopressor.  Scripts have been sent to [facility]. RNCM confirmed with pharmacist at [facility] that all scripts will be available at discharge for patient to pick up at no cost.  RNCM signing off.

## 2017-02-20 NOTE — Progress Notes (Signed)
Erin Hayes to be D/C'd Home per MD order.  Discussed prescriptions and follow up appointments with the patient. Prescriptions sent to [facility], medication list explained in detail. Pt verbalized understanding.  Allergies as of 02/20/2017   No Known Allergies     Medication List    STOP taking these medications   amLODipine 5 MG tablet Commonly known as:  NORVASC     TAKE these medications   albuterol 108 (90 Base) MCG/ACT inhaler Commonly known as:  PROVENTIL HFA;VENTOLIN HFA Inhale 2 puffs into the lungs every 4 (four) hours as needed for wheezing or shortness of breath.   aspirin 81 MG tablet Take 81 mg by mouth daily.   BAYER CONTOUR TEST test strip Generic drug:  glucose blood by Other route Two (2) times a day.   FIFTY50 GLUCOSE METER 2.0 w/Device Kit Frequency:PHARMDIR   Dosage:0.0     Instructions:  Note:use as directed by [place] (up to four times daily) Dose: 1 LANCET   glipiZIDE 10 MG tablet Commonly known as:  GLUCOTROL Take 10 mg by mouth.   levofloxacin 500 MG tablet Commonly known as:  LEVAQUIN Take 1 tablet (500 mg total) by mouth daily.   lisinopril 10 MG tablet Commonly known as:  PRINIVIL,ZESTRIL Take 1 tablet (10 mg total) by mouth daily.   loperamide 2 MG capsule Commonly known as:  IMODIUM Take 2 capsules (4 mg total) by mouth as needed for diarrhea or loose stools.   metoprolol tartrate 25 MG tablet Commonly known as:  LOPRESSOR Take 1 tablet (25 mg total) by mouth 2 (two) times daily.       Vitals:   02/20/17 0831 02/20/17 1230  BP: 123/82 115/83  Pulse: (!) 110 93  Resp: 18 18  Temp: 98.4 F (36.9 C) 98.4 F (36.9 C)  SpO2: 98% 96%    Skin clean, dry and intact without evidence of skin break down, no evidence of skin tears noted. IV catheter discontinued intact.Site without signs and symptoms of complications. Tele box removed and returned.Dressing and pressure applied. Pt denies pain at this time. No  complaints noted.  An After Visit Summary was printed and given to the patient. Patient escorted via [place], and D/C home via private auto.  Erin Hayes

## 2017-02-21 ENCOUNTER — Ambulatory Visit: Payer: Self-pay | Admitting: Adult Health Nurse Practitioner

## 2017-02-21 VITALS — BP 115/85 | HR 88 | Temp 98.6°F | Wt 98.4 lb

## 2017-02-21 DIAGNOSIS — E0800 Diabetes mellitus due to underlying condition with hyperosmolarity without nonketotic hyperglycemic-hyperosmolar coma (NKHHC): Secondary | ICD-10-CM

## 2017-02-21 DIAGNOSIS — I10 Essential (primary) hypertension: Secondary | ICD-10-CM

## 2017-02-21 DIAGNOSIS — J189 Pneumonia, unspecified organism: Secondary | ICD-10-CM

## 2017-02-21 DIAGNOSIS — J181 Lobar pneumonia, unspecified organism: Secondary | ICD-10-CM

## 2017-02-21 LAB — GLUCOSE, POCT (MANUAL RESULT ENTRY): POC Glucose: 159 mg/dl — AB (ref 70–99)

## 2017-02-21 MED ORDER — ALBUTEROL SULFATE HFA 108 (90 BASE) MCG/ACT IN AERS: 2.0000 | INHALATION_SPRAY | RESPIRATORY_TRACT | 3 refills | Status: DC | PRN

## 2017-02-21 NOTE — Progress Notes (Signed)
   Subjective:    Patient ID: Erin Hayes, female    DOB: 03-24-1966, 51 y.o.   MRN: [id number]  HPI  Erin Hayes is a 51 yo Female. She was recently hospitalized for L lower Lung PNA for 2 days. She was prescribed Levaquin and Lopressor was increased to 42m, 1 tablet BID.  Pt reports she is feeling better.  Denies N/V/D, CP or SOB.  Pt reports her cough remains.  Patient Active Problem List   Diagnosis Date Noted  . Pneumonia 02/19/2017  . Protein-calorie malnutrition, severe 11/02/2016  . AKI (acute kidney injury) ([place] 10/31/2016  . Diarrhea 10/31/2016  . UTI (urinary tract infection) 10/31/2016  . Hypokalemia 04/28/2013  . Leukocytosis, unspecified 04/28/2013  . Encephalopathy 04/28/2013  . DKA (diabetic ketoacidoses) ([place] 04/26/2013  . Diabetes ([place]   . Hypertension   . Hypercholesterolemia 11/10/2012  . Obesity 11/13/2011   Allergies as of 02/21/2017   No Known Allergies     Medication List       Accurate as of 02/21/17  8:18 PM. Always use your most recent med list.          albuterol 108 (90 Base) MCG/ACT inhaler Commonly known as:  PROVENTIL HFA;VENTOLIN HFA Inhale 2 puffs into the lungs every 4 (four) hours as needed for wheezing or shortness of breath.   aspirin 81 MG tablet Take 81 mg by mouth daily.   BAYER CONTOUR TEST test strip Generic drug:  glucose blood by Other route Two (2) times a day.   FIFTY50 GLUCOSE METER 2.0 w/Device Kit Frequency:PHARMDIR   Dosage:0.0     Instructions:  Note:use as directed by [place](up to four times daily) Dose: 1 LANCET   glipiZIDE 10 MG tablet Commonly known as:  GLUCOTROL Take 10 mg by mouth.   levofloxacin 500 MG tablet Commonly known as:  LEVAQUIN Take 1 tablet (500 mg total) by mouth daily.   lisinopril 10 MG tablet Commonly known as:  PRINIVIL,ZESTRIL Take 1 tablet (10 mg total) by mouth daily.   loperamide 2 MG capsule Commonly known as:  IMODIUM Take 2 capsules (4 mg total) by mouth  as needed for diarrhea or loose stools.   metoprolol tartrate 25 MG tablet Commonly known as:  LOPRESSOR Take 1 tablet (25 mg total) by mouth 2 (two) times daily.        Review of Systems  All other systems reviewed and are negative.       Objective:   Physical Exam  Constitutional: She is oriented to person, place, and time. She appears well-developed and well-nourished.  HENT:  Head: Atraumatic.  Cardiovascular: Normal rate, regular rhythm and normal heart sounds.   Pulmonary/Chest: Effort normal and breath sounds normal.  Abdominal: Soft. Bowel sounds are normal.  Neurological: She is alert and oriented to person, place, and time.  Skin: Skin is warm and dry.  Vitals reviewed.   BP 115/85   Pulse 88   Temp 98.6 F (37 C)   Wt 98 lb 6.4 oz (44.6 kg)   BMI 19.87 kg/m        Assessment & Plan:   Refilled albuterol.  Complete antibiotic course.  Monitor for worsening symptoms.  Increase fluids.   FU in 1 month for routine visit with labs a week prior.

## 2017-02-23 LAB — CULTURE, BLOOD (ROUTINE X 2)
Culture: NO GROWTH
Culture: NO GROWTH
SPECIAL REQUESTS: ADEQUATE
Special Requests: ADEQUATE

## 2017-03-07 ENCOUNTER — Ambulatory Visit: Payer: Self-pay | Admitting: Gastroenterology

## 2017-03-07 ENCOUNTER — Ambulatory Visit (INDEPENDENT_AMBULATORY_CARE_PROVIDER_SITE_OTHER): Payer: Self-pay | Admitting: Gastroenterology

## 2017-03-07 ENCOUNTER — Encounter: Payer: Self-pay | Admitting: Gastroenterology

## 2017-03-07 VITALS — BP 127/100 | HR 100 | Temp 98.5°F | Ht 59.0 in | Wt 99.0 lb

## 2017-03-07 DIAGNOSIS — R197 Diarrhea, unspecified: Secondary | ICD-10-CM

## 2017-03-07 NOTE — Progress Notes (Signed)
Jonathon Bellows MD, MRCP(U.K) [address]  [place]  [place], [place] [address]  Main: [phone]  Fax: [phone]   Primary Care Physician: Patient, No Pcp Per  Primary Gastroenterologist:  Dr. Jonathon Bellows   Chief Complaint  Patient presents with  . Abdominal Pain  . Diarrhea    HPI: KRISTE BROMAN is a 51 y.o. female   Summary of history : She was seen by Dr Michail Sermon on 11/03/16 as an inpatient at [facility] when she presented with chronic diarrhea for 3 months in duration . CT abdomen in 09/2016 showed colon wall thickening . No prior colonoscopy . Hb 14.3 , PLT 354 , very low potassium , AKI on admission. Stool tested negative for c diff and GI PCR. She underwent a flexible sigmoidoscopy during the hospitalization and diffuse colinic edema was seen , random colon biopsies were taken . No abnormalities were seen per pathology report. At her visit on 12/05/16 I found out that she uses splenda 10-15 packets a day , 1-2 diet sodas a week , unsweetended tea with splenda.   Interval history 01/10/17 -03/07/17  01/21/17- Colonoscopy -no mucosal abnormalities. Biopsies were negative for microscopic colitis. She did have a tubular adenoma. EGD with duodenal bx were negative for celiac disease.   Stopped splena, no other artificial sweeteners. Consumes some apple juice daily. 3-4 bowel movements a day , some watery and some not.   Did not obtain stool studies to calculate osmolar gap or celiac panel or fecal lactoferrin . Tries imodium which she says helps. Denies any gas or bloating   Current Outpatient Prescriptions  Medication Sig Dispense Refill  . albuterol (PROVENTIL HFA;VENTOLIN HFA) 108 (90 Base) MCG/ACT inhaler Inhale 2 puffs into the lungs every 4 (four) hours as needed for wheezing or shortness of breath. 1 Inhaler 3  . amLODipine (NORVASC) 5 MG tablet Take 5 mg by mouth daily.    [name] aspirin 81 MG tablet Take 81 mg by mouth daily.    . Blood Glucose Monitoring Suppl (FIFTY50  GLUCOSE METER 2.0) w/Device KIT Frequency:PHARMDIR   Dosage:0.0     Instructions:  Note:use as directed by [place] (up to four times daily) Dose: 1 LANCET    . glipiZIDE (GLUCOTROL) 10 MG tablet Take 10 mg by mouth.    [name] glucose blood (BAYER CONTOUR TEST) test strip by Other route Two (2) times a day.    . lisinopril (PRINIVIL,ZESTRIL) 10 MG tablet Take 1 tablet (10 mg total) by mouth daily. 30 tablet 5  . loperamide (IMODIUM) 2 MG capsule Take 2 capsules (4 mg total) by mouth as needed for diarrhea or loose stools. 30 capsule 0  . metoprolol tartrate (LOPRESSOR) 25 MG tablet Take 1 tablet (25 mg total) by mouth 2 (two) times daily. 60 tablet 2   Current Facility-Administered Medications  Medication Dose Route Frequency Provider Last Rate Last Dose  . pioglitazone (ACTOS) tablet 15 mg  15 mg Oral Daily Jerrol Banana., MD        Allergies as of 03/07/2017  . (No Known Allergies)    ROS:  General: Negative for anorexia, weight loss, fever, chills, fatigue, weakness. ENT: Negative for hoarseness, difficulty swallowing , nasal congestion. CV: Negative for chest pain, angina, palpitations, dyspnea on exertion, peripheral edema.  Respiratory: Negative for dyspnea at rest, dyspnea on exertion, cough, sputum, wheezing.  GI: See history of present illness. GU:  Negative for dysuria, hematuria, urinary incontinence, urinary frequency, nocturnal urination.  Endo: Negative for unusual weight change.    Physical Examination:   BP (!) 144/108   Pulse (!) 104   Temp 98.5 F (36.9 C) (Oral)   Ht '4\' 11"'$  (1.499 m)   Wt 99 lb (44.9 kg)   BMI 20.00 kg/m   General: Well-nourished, well-developed in no acute distress.  Eyes: No icterus. Conjunctivae pink. Mouth: Oropharyngeal mucosa moist and pink , no lesions erythema or exudate. Lungs: Clear to auscultation bilaterally. Non-labored. Heart: Regular rate and rhythm, no murmurs rubs or gallops.  Abdomen: Bowel sounds are  normal, nontender, nondistended, no hepatosplenomegaly or masses, no abdominal bruits or hernia , no rebound or guarding.   Extremities: No lower extremity edema. No clubbing or deformities. Neuro: Alert and oriented x 3.  Grossly intact. Skin: Warm and dry, no jaundice.   Psych: Alert and cooperative, normal mood and affect.   Imaging Studies: Dg Chest 2 View  Result Date: 02/18/2017 CLINICAL DATA:  51 y/o  F; shortness of breath. EXAM: CHEST  2 VIEW COMPARISON:  02/04/2017 chest radiograph FINDINGS: Stable cardiomegaly. Left lower lobe basilar consolidation and effusion. Clear right lung. Bones are unremarkable. IMPRESSION: Left lower lobe basilar consolidation and small left pleural effusion likely represents pneumonia. Stable cardiomegaly. Electronically Signed   By: Kristine Garbe M.D.   On: 02/18/2017 20:40    Assessment and Plan:   SOUMYA COLSON is a 51 y.o. y/o female  here to follow up for severe . Symptoms are on and off. EGD+colonoscopy were negative  Plan  1. Stop all artificial sweeteners  2. Check stool studies for  leucocytes and c diff,PCR 3. Trial of CREON for Extrapancreatic insufficiency - samples provided for a week 4. Continue imodium PRN  Dr Jonathon Bellows  MD,MRCP [facility]) Follow up in 4 weeks

## 2017-03-14 ENCOUNTER — Other Ambulatory Visit: Payer: Self-pay

## 2017-03-21 ENCOUNTER — Ambulatory Visit: Payer: Self-pay

## 2017-04-03 ENCOUNTER — Emergency Department: Payer: Self-pay

## 2017-04-03 ENCOUNTER — Inpatient Hospital Stay
Admission: EM | Admit: 2017-04-03 | Discharge: 2017-04-08 | DRG: 287 | Disposition: A | Discharge status: Home / Self Care | Payer: Self-pay | Attending: Internal Medicine | Admitting: Internal Medicine

## 2017-04-03 ENCOUNTER — Encounter: Payer: Self-pay | Admitting: Emergency Medicine

## 2017-04-03 DIAGNOSIS — R0602 Shortness of breath: Secondary | ICD-10-CM

## 2017-04-03 DIAGNOSIS — I428 Other cardiomyopathies: Secondary | ICD-10-CM | POA: Diagnosis present

## 2017-04-03 DIAGNOSIS — Z9889 Other specified postprocedural states: Secondary | ICD-10-CM

## 2017-04-03 DIAGNOSIS — E785 Hyperlipidemia, unspecified: Secondary | ICD-10-CM | POA: Diagnosis present

## 2017-04-03 DIAGNOSIS — I959 Hypotension, unspecified: Secondary | ICD-10-CM | POA: Diagnosis not present

## 2017-04-03 DIAGNOSIS — E876 Hypokalemia: Secondary | ICD-10-CM | POA: Diagnosis present

## 2017-04-03 DIAGNOSIS — Z7984 Long term (current) use of oral hypoglycemic drugs: Secondary | ICD-10-CM

## 2017-04-03 DIAGNOSIS — T502X5A Adverse effect of carbonic-anhydrase inhibitors, benzothiadiazides and other diuretics, initial encounter: Secondary | ICD-10-CM | POA: Diagnosis not present

## 2017-04-03 DIAGNOSIS — K219 Gastro-esophageal reflux disease without esophagitis: Secondary | ICD-10-CM | POA: Diagnosis present

## 2017-04-03 DIAGNOSIS — I509 Heart failure, unspecified: Secondary | ICD-10-CM

## 2017-04-03 DIAGNOSIS — Z8249 Family history of ischemic heart disease and other diseases of the circulatory system: Secondary | ICD-10-CM

## 2017-04-03 DIAGNOSIS — I5023 Acute on chronic systolic (congestive) heart failure: Secondary | ICD-10-CM | POA: Diagnosis present

## 2017-04-03 DIAGNOSIS — I252 Old myocardial infarction: Secondary | ICD-10-CM

## 2017-04-03 DIAGNOSIS — K529 Noninfective gastroenteritis and colitis, unspecified: Secondary | ICD-10-CM | POA: Diagnosis present

## 2017-04-03 DIAGNOSIS — D649 Anemia, unspecified: Secondary | ICD-10-CM | POA: Diagnosis present

## 2017-04-03 DIAGNOSIS — E119 Type 2 diabetes mellitus without complications: Secondary | ICD-10-CM | POA: Diagnosis present

## 2017-04-03 DIAGNOSIS — R748 Abnormal levels of other serum enzymes: Secondary | ICD-10-CM | POA: Diagnosis present

## 2017-04-03 DIAGNOSIS — I2510 Atherosclerotic heart disease of native coronary artery without angina pectoris: Secondary | ICD-10-CM | POA: Diagnosis present

## 2017-04-03 DIAGNOSIS — I110 Hypertensive heart disease with heart failure: Principal | ICD-10-CM | POA: Diagnosis present

## 2017-04-03 DIAGNOSIS — Z79899 Other long term (current) drug therapy: Secondary | ICD-10-CM

## 2017-04-03 DIAGNOSIS — E7800 Pure hypercholesterolemia, unspecified: Secondary | ICD-10-CM | POA: Diagnosis present

## 2017-04-03 DIAGNOSIS — J9811 Atelectasis: Secondary | ICD-10-CM | POA: Diagnosis present

## 2017-04-03 DIAGNOSIS — Z7982 Long term (current) use of aspirin: Secondary | ICD-10-CM

## 2017-04-03 HISTORY — DX: Hyperlipidemia, unspecified: E78.5

## 2017-04-03 LAB — CBC WITH DIFFERENTIAL/PLATELET
BASOS ABS: 0.1 10*3/uL (ref 0–0.1)
BASOS PCT: 1 %
EOS ABS: 0.1 10*3/uL (ref 0–0.7)
EOS PCT: 1 %
HEMATOCRIT: 35.5 % (ref 35.0–47.0)
Hemoglobin: 11.6 g/dL — ABNORMAL LOW (ref 12.0–16.0)
Lymphocytes Relative: 14 %
Lymphs Abs: 1.4 10*3/uL (ref 1.0–3.6)
MCH: 26.7 pg (ref 26.0–34.0)
MCHC: 32.6 g/dL (ref 32.0–36.0)
MCV: 81.8 fL (ref 80.0–100.0)
MONO ABS: 0.7 10*3/uL (ref 0.2–0.9)
Monocytes Relative: 7 %
NEUTROS ABS: 8.1 10*3/uL — AB (ref 1.4–6.5)
Neutrophils Relative %: 79 %
PLATELETS: 366 10*3/uL (ref 150–440)
RBC: 4.33 MIL/uL (ref 3.80–5.20)
RDW: 13.3 % (ref 11.5–14.5)
WBC: 10.2 10*3/uL (ref 3.6–11.0)

## 2017-04-03 LAB — COMPREHENSIVE METABOLIC PANEL
ALBUMIN: 3.2 g/dL — AB (ref 3.5–5.0)
ALK PHOS: 98 U/L (ref 38–126)
ALT: 14 U/L (ref 14–54)
AST: 15 U/L (ref 15–41)
Anion gap: 10 (ref 5–15)
BILIRUBIN TOTAL: 0.6 mg/dL (ref 0.3–1.2)
BUN: 14 mg/dL (ref 6–20)
CALCIUM: 8.6 mg/dL — AB (ref 8.9–10.3)
CO2: 24 mmol/L (ref 22–32)
CREATININE: 0.64 mg/dL (ref 0.44–1.00)
Chloride: 104 mmol/L (ref 101–111)
Glucose, Bld: 118 mg/dL — ABNORMAL HIGH (ref 65–99)
Potassium: 3.5 mmol/L (ref 3.5–5.1)
SODIUM: 138 mmol/L (ref 135–145)
Total Protein: 6.6 g/dL (ref 6.5–8.1)

## 2017-04-03 LAB — TROPONIN I

## 2017-04-03 LAB — BRAIN NATRIURETIC PEPTIDE: B NATRIURETIC PEPTIDE 5: 1287 pg/mL — AB (ref 0.0–100.0)

## 2017-04-03 LAB — GLUCOSE, CAPILLARY: Glucose-Capillary: 143 mg/dL — ABNORMAL HIGH (ref 65–99)

## 2017-04-03 MED ORDER — ASPIRIN EC 81 MG PO TBEC
81.0000 mg | DELAYED_RELEASE_TABLET | Freq: Every day | ORAL | Status: DC
  Administered 2017-04-03 – 2017-04-08 (×6): 81 mg via ORAL
  Filled 2017-04-03 (×6): qty 1

## 2017-04-03 MED ORDER — FUROSEMIDE 10 MG/ML IJ SOLN
40.0000 mg | Freq: Once | INTRAMUSCULAR | Status: AC
  Administered 2017-04-03: 40 mg via INTRAVENOUS
  Filled 2017-04-03 (×2): qty 4

## 2017-04-03 MED ORDER — METOPROLOL TARTRATE 25 MG PO TABS
25.0000 mg | ORAL_TABLET | Freq: Two times a day (BID) | ORAL | Status: DC
  Administered 2017-04-03 – 2017-04-04 (×2): 25 mg via ORAL
  Filled 2017-04-03 (×2): qty 1

## 2017-04-03 MED ORDER — ALBUTEROL SULFATE (2.5 MG/3ML) 0.083% IN NEBU: 2.5000 mg | INHALATION_SOLUTION | RESPIRATORY_TRACT | Status: DC | PRN

## 2017-04-03 MED ORDER — ENOXAPARIN SODIUM 40 MG/0.4ML ~~LOC~~ SOLN
40.0000 mg | SUBCUTANEOUS | Status: DC
  Administered 2017-04-03 – 2017-04-05 (×3): 40 mg via SUBCUTANEOUS
  Filled 2017-04-03 (×3): qty 0.4

## 2017-04-03 MED ORDER — FUROSEMIDE 10 MG/ML IJ SOLN
40.0000 mg | Freq: Two times a day (BID) | INTRAMUSCULAR | Status: DC
  Administered 2017-04-03 – 2017-04-05 (×3): 40 mg via INTRAVENOUS
  Filled 2017-04-03 (×4): qty 4

## 2017-04-03 MED ORDER — ONDANSETRON HCL 4 MG/2ML IJ SOLN: 4.0000 mg | Freq: Four times a day (QID) | INTRAMUSCULAR | Status: DC | PRN

## 2017-04-03 MED ORDER — ACETAMINOPHEN 325 MG PO TABS: 650.0000 mg | ORAL_TABLET | Freq: Four times a day (QID) | ORAL | Status: DC | PRN

## 2017-04-03 MED ORDER — ONDANSETRON HCL 4 MG PO TABS: 4.0000 mg | ORAL_TABLET | Freq: Four times a day (QID) | ORAL | Status: DC | PRN

## 2017-04-03 MED ORDER — INSULIN ASPART 100 UNIT/ML ~~LOC~~ SOLN: 0.0000 [IU] | Freq: Every day | SUBCUTANEOUS | Status: DC

## 2017-04-03 MED ORDER — LISINOPRIL 10 MG PO TABS
10.0000 mg | ORAL_TABLET | Freq: Every day | ORAL | Status: DC
  Administered 2017-04-03 – 2017-04-04 (×2): 10 mg via ORAL
  Filled 2017-04-03 (×2): qty 1

## 2017-04-03 MED ORDER — INSULIN ASPART 100 UNIT/ML ~~LOC~~ SOLN
0.0000 [IU] | Freq: Three times a day (TID) | SUBCUTANEOUS | Status: DC
  Administered 2017-04-04 (×3): 1 [IU] via SUBCUTANEOUS
  Administered 2017-04-05 – 2017-04-06 (×2): 3 [IU] via SUBCUTANEOUS
  Administered 2017-04-06: 1 [IU] via SUBCUTANEOUS
  Administered 2017-04-07: 2 [IU] via SUBCUTANEOUS
  Administered 2017-04-07 – 2017-04-08 (×3): 3 [IU] via SUBCUTANEOUS
  Administered 2017-04-08: 1 [IU] via SUBCUTANEOUS
  Filled 2017-04-03 (×11): qty 1

## 2017-04-03 MED ORDER — AMLODIPINE BESYLATE 5 MG PO TABS
5.0000 mg | ORAL_TABLET | Freq: Every day | ORAL | Status: DC
  Administered 2017-04-03 – 2017-04-04 (×2): 5 mg via ORAL
  Filled 2017-04-03 (×2): qty 1

## 2017-04-03 MED ORDER — ACETAMINOPHEN 650 MG RE SUPP: 650.0000 mg | Freq: Four times a day (QID) | RECTAL | Status: DC | PRN

## 2017-04-03 NOTE — ED Triage Notes (Signed)
Patient presents to the ED with increased bilateral foot and ankle swelling x 1 week.  Patient has rash to feet that patient states, "that's been on there for awhile now."  Patient reports feet became very painful today, the right is more painful than the left.  Patient is in no obvious distress at this time.  Patient is wearing flip flops and toenails appear very long, thick, dark, and unkempt.

## 2017-04-03 NOTE — ED Notes (Signed)
Admission MD at bedside.  

## 2017-04-03 NOTE — ED Provider Notes (Signed)
[facility] Emergency Department Provider Note  ____________________________________________   I have reviewed the triage vital signs and the nursing notes.   HISTORY  Chief Complaint Foot Swelling    HPI Erin Hayes is a 51 y.o. female  with a history of diabetes, no known history of CHF, borderline left bundle branch block on EKG done in October, who never had an echo presents today with bilateral lower extremity swelling.  She states over the last week it has been getting worse and worse.  Patient does take HCTZ.  She denies any significant shortness of breath orthopnea.  She has had no fever no chills no cough.  But she is concerned about the lower extremity swelling.  Patient has had negative Dopplers in the past and has had swelling in the legs before but this is worse than normal and is bilateral, left feels more than right.  She states nothing makes it better, except for putting her feet up and even now that is not sufficient and nothing makes it worse except for walking around.  She has had no recent travel no personal or family history of PE or DVT.    Past Medical History:  Diagnosis Date  . AKI (acute kidney injury) ([place]) 10/31/2016  . Diabetes ([place])   . GERD (gastroesophageal reflux disease)   . Hypertension   . Non-smoker   . Pneumonia 02/19/2017    Patient Active Problem List   Diagnosis Date Noted  . Pneumonia 02/19/2017  . Protein-calorie malnutrition, severe 11/02/2016  . AKI (acute kidney injury) ([place]) 10/31/2016  . Diarrhea 10/31/2016  . UTI (urinary tract infection) 10/31/2016  . Hypokalemia 04/28/2013  . Leukocytosis, unspecified 04/28/2013  . Encephalopathy 04/28/2013  . DKA (diabetic ketoacidoses) ([place]) 04/26/2013  . Diabetes ([place])   . Hypertension   . Hypercholesterolemia 11/10/2012  . Obesity 11/13/2011    Past Surgical History:  Procedure Laterality Date  . COLONOSCOPY WITH PROPOFOL N/A 01/21/2017   Procedure:  COLONOSCOPY WITH PROPOFOL;  Surgeon: Jonathon Bellows, MD;  Location: [facility] ENDOSCOPY;  Service: Gastroenterology;  Laterality: N/A;  . ESOPHAGOGASTRODUODENOSCOPY (EGD) WITH PROPOFOL N/A 01/21/2017   Procedure: ESOPHAGOGASTRODUODENOSCOPY (EGD) WITH PROPOFOL;  Surgeon: Jonathon Bellows, MD;  Location: [facility] ENDOSCOPY;  Service: Gastroenterology;  Laterality: N/A;  . FLEXIBLE SIGMOIDOSCOPY N/A 11/04/2016   Procedure: FLEXIBLE SIGMOIDOSCOPY;  Surgeon: Wilford Corner, MD;  Location: [facility] ENDOSCOPY;  Service: Endoscopy;  Laterality: N/A;  . LITHOTRIPSY      Prior to Admission medications   Medication Sig Start Date End Date Taking? Authorizing Provider  albuterol (PROVENTIL HFA;VENTOLIN HFA) 108 (90 Base) MCG/ACT inhaler Inhale 2 puffs into the lungs every 4 (four) hours as needed for wheezing or shortness of breath. 02/21/17   Doles-Johnson, Teah, NP  amLODipine (NORVASC) 5 MG tablet Take 5 mg by mouth daily.    [provider]  aspirin 81 MG tablet Take 81 mg by mouth daily.    [provider]  Blood Glucose Monitoring Suppl (FIFTY50 GLUCOSE METER 2.0) w/Device KIT Frequency:PHARMDIR   Dosage:0.0     Instructions:  Note:use as directed by [place] (up to four times daily) Dose: 1 LANCET 05/13/13   [provider]  glipiZIDE (GLUCOTROL) 10 MG tablet Take 10 mg by mouth. 06/04/14   [provider]  glucose blood (BAYER CONTOUR TEST) test strip by Other route Two (2) times a day. 05/13/13   [provider]  lisinopril (PRINIVIL,ZESTRIL) 10 MG tablet Take 1 tablet (10 mg total) by  mouth daily. 01/10/17   Jerrol Banana., MD  loperamide (IMODIUM) 2 MG capsule Take 2 capsules (4 mg total) by mouth as needed for diarrhea or loose stools. 11/06/16   Dustin Flock, MD  metoprolol tartrate (LOPRESSOR) 25 MG tablet Take 1 tablet (25 mg total) by mouth 2 (two) times daily. 02/20/17   Fritzi Mandes, MD    Allergies Patient has no known allergies.  Family History   Problem Relation Age of Onset  . Hypertension Father   . COPD Neg Hx   . Diabetes Mellitus II Neg Hx     Social History Social History   Tobacco Use  . Smoking status: Never Smoker  . Smokeless tobacco: Never Used  Substance Use Topics  . Alcohol use: No  . Drug use: No    Review of Systems Constitutional: No fever/chills Eyes: No visual changes. ENT: No sore throat. No stiff neck no neck pain Cardiovascular: Denies chest pain. Respiratory: Denies shortness of breath. Gastrointestinal:   no vomiting.  No diarrhea.  No constipation. Genitourinary: Negative for dysuria. Musculoskeletal: Positive lower extremity swelling Skin: Negative for rash. Neurological: Negative for severe headaches, focal weakness or numbness.   ____________________________________________   PHYSICAL EXAM:  VITAL SIGNS: ED Triage Vitals [04/03/17 1455]  Enc Vitals Group     BP (!) 141/106     Pulse Rate 100     Resp 18     Temp 99.4 F (37.4 C)     Temp Source Oral     SpO2 99 %     Weight 98 lb (44.5 kg)     Height '4\' 11"'$  (1.499 m)     Head Circumference      Peak Flow      Pain Score 10     Pain Loc      Pain Edu?      Excl. in [place]?     Constitutional: Alert and oriented. Well appearing and in no acute distress. Eyes: Conjunctivae are normal Head: Atraumatic HEENT: No congestion/rhinnorhea. Mucous membranes are moist.  Oropharynx non-erythematous Neck:   Nontender with no meningismus, no masses, no stridor Cardiovascular: Normal rate, regular rhythm. Grossly normal heart sounds.  Good peripheral circulation. Respiratory: Normal respiratory effort.  No retractions.  Diminished in the left bases Abdominal: Soft and nontender. No distention. No guarding no rebound Back:  There is no focal tenderness or step off.  there is no midline tenderness there are no lesions noted. there is no CVA tenderness Musculoskeletal: No lower extremity tenderness, no upper extremity tenderness. No  joint effusions, no DVT significant edema limits my palpation of her pulses but both feet are warm and she has very good dopplerable pulses bilaterally there is 2-3+ bilateral pitting edema  neurologic:  Normal speech and language. No gross focal neurologic deficits are appreciated.  Skin:  Skin is warm, dry and intact patient has a chronic appearing skin rash on the lower extremities, blanchable non-petechial Psychiatric: Mood and affect are normal. Speech and behavior are normal.  ____________________________________________   LABS (all labs ordered are listed, but only abnormal results are displayed)  Labs Reviewed  CBC WITH DIFFERENTIAL/PLATELET - Abnormal; Notable for the following components:      Result Value   Hemoglobin 11.6 (*)    Neutro Abs 8.1 (*)    All other components within normal limits  COMPREHENSIVE METABOLIC PANEL - Abnormal; Notable for the following components:   Glucose, Bld 118 (*)    Calcium 8.6 (*)  Albumin 3.2 (*)    All other components within normal limits  BRAIN NATRIURETIC PEPTIDE - Abnormal; Notable for the following components:   B Natriuretic Peptide 1,287.0 (*)    All other components within normal limits  TROPONIN I    Pertinent labs  results that were available during my care of the patient were reviewed by me and considered in my medical decision making (see chart for details). ____________________________________________  EKG  I personally interpreted any EKGs ordered by me or triage Mild tachycardia rate 104, sinus, borderline left bundle branch block, LAD noted.  No significant change from prior   ____________________________________________  RADIOLOGY  Pertinent labs & imaging results that were available during my care of the patient were reviewed by me and considered in my medical decision making (see chart for details). If possible, patient and/or family made aware of any abnormal findings.  Dg Chest 2 View  Result Date:  04/03/2017 CLINICAL DATA:  Increased BILATERAL foot and ankle swelling for 1 week, rash at feet, pain in feet RIGHT greater than LEFT, history of diabetes mellitus, hypertension EXAM: CHEST  2 VIEW COMPARISON:  02/18/2017 FINDINGS: Enlargement of cardiac silhouette with pulmonary vascular congestion. Moderate to large LEFT pleural effusion increased since previous exam. Significantly increased LEFT basilar atelectasis. Question mild pulmonary edema in the RIGHT perihilar and basilar regions. Upper lungs grossly clear. No pneumothorax. Bones demineralized. IMPRESSION: Enlargement of cardiac silhouette with pulmonary vascular congestion and question mild pulmonary edema. Significant interval increase in LEFT pleural effusion and basilar atelectasis. Electronically Signed   By: Lavonia Dana M.D.   On: 04/03/2017 17:26   ____________________________________________    PROCEDURES  Procedure(s) performed: None  Procedures  Critical Care performed: None  ____________________________________________   INITIAL IMPRESSION / ASSESSMENT AND PLAN / ED COURSE  Pertinent labs & imaging results that were available during my care of the patient were reviewed by me and considered in my medical decision making (see chart for details).  With evidence of decomp stated heart failure, although her respiratory symptoms at this time are minimal they certainly will worsen.  We will give IV Lasix I talked to Dr. Haroldine Laws of cardiology and he agrees with management and strongly recommends admission.  Hospitalist will take patient.  No evidence of DVT at this time I think this is all heart failure.    ____________________________________________   FINAL CLINICAL IMPRESSION(S) / ED DIAGNOSES  Final diagnoses:  None      This chart was dictated using voice recognition software.  Despite best efforts to proofread,  errors can occur which can change meaning.      Schuyler Amor, MD 04/03/17 [id number]

## 2017-04-03 NOTE — ED Notes (Signed)
Pt to the bathroom x 3 in 30 minutes.

## 2017-04-03 NOTE — H&P (Signed)
[facility] - [place] [facility]   PATIENT NAME: Erin Hayes    MR#:  [id number]  DATE OF BIRTH:  07/03/1965  DATE OF ADMISSION:  04/03/2017  PRIMARY CARE PHYSICIAN: Patient, No Pcp Per   REQUESTING/REFERRING PHYSICIAN: Dr. James Mcshane  CHIEF COMPLAINT:   Chief Complaint  Patient presents with  . Foot Swelling    HISTORY OF PRESENT ILLNESS:  Erin Hayes  is a 51 y.o. female with a known history of diabetes, hypertension, GERD who presents to the hospital due to worsening leg/foot swelling. Patient was hospitalized about a month ago for pneumonia and discharged on oral antibiotics, she had some leg swelling then but it has progressively gotten worse over the past month. She denies any shortness of breath but admits to a 3-4 pillow orthopnea. She denies any paroxysmal nocturnal dyspnea. She presents to the emergency room and underwent chest x-ray which showed findings suggestive of a moderate left-sided pleural effusion along with evidence of pulmonary edema. She was suspected to have congestive heart failure and therefore hospitalist services were contacted further treatment and evaluation. Patient denies any chest pains, nausea, vomiting, abdominal pain, fever, chills, cough or any other associated symptoms presently.  PAST MEDICAL HISTORY:   Past Medical History:  Diagnosis Date  . AKI (acute kidney injury) ([facility]) 10/31/2016  . CHF (congestive heart failure) ([facility]) 04/03/2017  . Diabetes ([facility])   . GERD (gastroesophageal reflux disease)   . Hypertension   . Non-smoker   . Pneumonia 02/19/2017    PAST SURGICAL HISTORY:   Past Surgical History:  Procedure Laterality Date  . COLONOSCOPY WITH PROPOFOL N/A 01/21/2017   Procedure: COLONOSCOPY WITH PROPOFOL;  Surgeon: Anna, Kiran, MD;  Location: [facility] ENDOSCOPY;  Service: Gastroenterology;  Laterality: N/A;  . ESOPHAGOGASTRODUODENOSCOPY (EGD) WITH PROPOFOL N/A 01/21/2017   Procedure: ESOPHAGOGASTRODUODENOSCOPY  (EGD) WITH PROPOFOL;  Surgeon: Anna, Kiran, MD;  Location: [facility] ENDOSCOPY;  Service: Gastroenterology;  Laterality: N/A;  . FLEXIBLE SIGMOIDOSCOPY N/A 11/04/2016   Procedure: FLEXIBLE SIGMOIDOSCOPY;  Surgeon: Schooler, Vincent, MD;  Location: [facility] ENDOSCOPY;  Service: Endoscopy;  Laterality: N/A;  . LITHOTRIPSY      SOCIAL HISTORY:   Social History   Tobacco Use  . Smoking status: Never Smoker  . Smokeless tobacco: Never Used  Substance Use Topics  . Alcohol use: No    FAMILY HISTORY:   Family History  Problem Relation Age of Onset  . Lung cancer Mother   . Hypertension Father   . COPD Neg Hx   . Diabetes Mellitus II Neg Hx     DRUG ALLERGIES:  No Known Allergies  REVIEW OF SYSTEMS:   Review of Systems  Constitutional: Negative for fever and weight loss.  HENT: Negative for congestion, nosebleeds and tinnitus.   Eyes: Negative for blurred vision, double vision and redness.  Respiratory: Negative for cough, hemoptysis and shortness of breath.   Cardiovascular: Positive for orthopnea and leg swelling. Negative for chest pain.  Gastrointestinal: Negative for abdominal pain, diarrhea, melena, nausea and vomiting.  Genitourinary: Negative for dysuria, hematuria and urgency.  Musculoskeletal: Negative for falls and joint pain.  Neurological: Negative for dizziness, tingling, sensory change, focal weakness, seizures, weakness and headaches.  Endo/Heme/Allergies: Negative for polydipsia. Does not bruise/bleed easily.  Psychiatric/Behavioral: Negative for depression and memory loss. The patient is not nervous/anxious.     MEDICATIONS AT HOME:   Prior to Admission medications   Medication Sig Start Date End Date Taking? Authorizing Provider  amLODipine (NORVASC) 5 MG tablet Take   5 mg by mouth daily.   Yes [provider]  aspirin 81 MG tablet Take 81 mg by mouth daily.   Yes [provider]  lisinopril (PRINIVIL,ZESTRIL) 10 MG tablet Take 1 tablet (10 mg  total) by mouth daily. 01/10/17  Yes Gilbert, Richard L Jr., MD  loperamide (IMODIUM) 2 MG capsule Take 2 capsules (4 mg total) by mouth as needed for diarrhea or loose stools. 11/06/16  Yes Patel, Shreyang, MD  metoprolol tartrate (LOPRESSOR) 25 MG tablet Take 1 tablet (25 mg total) by mouth 2 (two) times daily. 02/20/17  Yes Patel, Sona, MD  albuterol (PROVENTIL HFA;VENTOLIN HFA) 108 (90 Base) MCG/ACT inhaler Inhale 2 puffs into the lungs every 4 (four) hours as needed for wheezing or shortness of breath. 02/21/17   Doles-Johnson, Teah, NP  Blood Glucose Monitoring Suppl (FIFTY50 GLUCOSE METER 2.0) w/Device KIT Frequency:PHARMDIR   Dosage:0.0     Instructions:  Note:use as directed by [facility] (up to four times daily) Dose: 1 LANCET 05/13/13   [provider]  glucose blood (BAYER CONTOUR TEST) test strip by Other route Two (2) times a day. 05/13/13   [provider]      VITAL SIGNS:  Blood pressure (!) 141/106, pulse 100, temperature 99.4 F (37.4 C), temperature source Oral, resp. rate 18, height 4' 11" (1.499 m), weight 44.5 kg (98 lb), SpO2 99 %.  PHYSICAL EXAMINATION:  Physical Exam  GENERAL:  51 y.o.-year-old patient lying in the bed in no acute distress.  EYES: Pupils equal, round, reactive to light and accommodation. No scleral icterus. Extraocular muscles intact.  HEENT: Head atraumatic, normocephalic. Oropharynx and nasopharynx clear. No oropharyngeal erythema, moist oral mucosa  NECK:  Supple, no jugular venous distention. No thyroid enlargement, no tenderness.  LUNGS: Normal breath sounds bilaterally, no wheezing, rales, rhonchi. No use of accessory muscles of respiration.  CARDIOVASCULAR: S1, S2 RRR. No murmurs, rubs, gallops, clicks.  ABDOMEN: Soft, nontender, nondistended. Bowel sounds present. No organomegaly or mass.  EXTREMITIES: +2 pedal edema b/l from knees to ankles, No cyanosis, or clubbing. + 2 pedal & radial pulses b/l.   NEUROLOGIC: Cranial  nerves II through XII are intact. No focal Motor or sensory deficits appreciated b/l PSYCHIATRIC: The patient is alert and oriented x 3. SKIN: No obvious rash, lesion, or ulcer.   LABORATORY PANEL:   CBC Recent Labs  Lab 04/03/17 1527  WBC 10.2  HGB 11.6*  HCT 35.5  PLT 366   ------------------------------------------------------------------------------------------------------------------  Chemistries  Recent Labs  Lab 04/03/17 1527  NA 138  K 3.5  CL 104  CO2 24  GLUCOSE 118*  BUN 14  CREATININE 0.64  CALCIUM 8.6*  AST 15  ALT 14  ALKPHOS 98  BILITOT 0.6   ------------------------------------------------------------------------------------------------------------------  Cardiac Enzymes Recent Labs  Lab 04/03/17 1534  TROPONINI <0.03   ------------------------------------------------------------------------------------------------------------------  RADIOLOGY:  Dg Chest 2 View  Result Date: 04/03/2017 CLINICAL DATA:  Increased BILATERAL foot and ankle swelling for 1 week, rash at feet, pain in feet RIGHT greater than LEFT, history of diabetes mellitus, hypertension EXAM: CHEST  2 VIEW COMPARISON:  02/18/2017 FINDINGS: Enlargement of cardiac silhouette with pulmonary vascular congestion. Moderate to large LEFT pleural effusion increased since previous exam. Significantly increased LEFT basilar atelectasis. Question mild pulmonary edema in the RIGHT perihilar and basilar regions. Upper lungs grossly clear. No pneumothorax. Bones demineralized. IMPRESSION: Enlargement of cardiac silhouette with pulmonary vascular congestion and question mild pulmonary edema. Significant interval increase in LEFT pleural effusion   and basilar atelectasis. Electronically Signed   By: Mark  Boles M.D.   On: 04/03/2017 17:26     IMPRESSION AND PLAN:   51-year-old female with past medical history of diabetes, hypertension, GERD who presents to the hospital due to worsening lower extremity  edema and noted to be in congestive heart failure.  1. CHF-this is a suspected diagnosis given patient's worsening lower extremity edema and ongoing orthopnea. -Unclear this is systolic versus diastolic presently. We will check a two-dimensional echocardiogram. -Diuresis with IV Lasix, follow I's and O's and daily weights. Get a cardiology consult. Cycle her cardiac markers. Keep on telemetry. -Continue metoprolol, lisinopril  2. Diabetes type 2 without complication-hold her Actos for now  - Place on sliding scale insulin. Follow blood sugars  3. Essential hypertension-continue metoprolol, lisinopril, Norvasc.  All the records are reviewed and case discussed with ED provider. Management plans discussed with the patient, family and they are in agreement.  CODE STATUS: Full code  TOTAL TIME TAKING CARE OF THIS PATIENT: 40 minutes.    SAINANI,VIVEK J M.D on 04/03/2017 at 7:07 PM  Between 7am to 6pm - Pager - [phone]  After 6pm go to [email or web address] - password [email or web address]  [facility]  Office  [phone]  CC: Primary care physician; Patient, No Pcp Per    

## 2017-04-03 NOTE — ED Notes (Signed)
Lab at bedside for blood draw.

## 2017-04-03 NOTE — ED Triage Notes (Signed)
First Nurse Note:  Arrives with c/o bilateral leg swelling.  Patient states has history of same, but swelling worse today.

## 2017-04-04 ENCOUNTER — Inpatient Hospital Stay: Payer: Self-pay

## 2017-04-04 ENCOUNTER — Inpatient Hospital Stay (HOSPITAL_COMMUNITY)
Admit: 2017-04-04 | Discharge: 2017-04-04 | Disposition: A | Discharge status: Home / Self Care | Payer: Self-pay | Attending: Specialist | Admitting: Specialist

## 2017-04-04 ENCOUNTER — Encounter: Payer: Self-pay | Admitting: Physician Assistant

## 2017-04-04 ENCOUNTER — Other Ambulatory Visit: Payer: Self-pay

## 2017-04-04 DIAGNOSIS — I5021 Acute systolic (congestive) heart failure: Secondary | ICD-10-CM

## 2017-04-04 DIAGNOSIS — R600 Localized edema: Secondary | ICD-10-CM

## 2017-04-04 DIAGNOSIS — I340 Nonrheumatic mitral (valve) insufficiency: Secondary | ICD-10-CM

## 2017-04-04 DIAGNOSIS — R0602 Shortness of breath: Secondary | ICD-10-CM

## 2017-04-04 DIAGNOSIS — E119 Type 2 diabetes mellitus without complications: Secondary | ICD-10-CM

## 2017-04-04 LAB — MAGNESIUM: MAGNESIUM: 1.4 mg/dL — AB (ref 1.7–2.4)

## 2017-04-04 LAB — CBC
HCT: 32 % — ABNORMAL LOW (ref 35.0–47.0)
HEMOGLOBIN: 10.3 g/dL — AB (ref 12.0–16.0)
MCH: 26.2 pg (ref 26.0–34.0)
MCHC: 32.2 g/dL (ref 32.0–36.0)
MCV: 81.1 fL (ref 80.0–100.0)
Platelets: 339 10*3/uL (ref 150–440)
RBC: 3.95 MIL/uL (ref 3.80–5.20)
RDW: 13.1 % (ref 11.5–14.5)
WBC: 7.6 10*3/uL (ref 3.6–11.0)

## 2017-04-04 LAB — BASIC METABOLIC PANEL
ANION GAP: 12 (ref 5–15)
BUN: 12 mg/dL (ref 6–20)
CHLORIDE: 101 mmol/L (ref 101–111)
CO2: 25 mmol/L (ref 22–32)
Calcium: 8 mg/dL — ABNORMAL LOW (ref 8.9–10.3)
Creatinine, Ser: 0.62 mg/dL (ref 0.44–1.00)
GFR calc non Af Amer: >60 mL/min (ref >60)
Glucose, Bld: 120 mg/dL — ABNORMAL HIGH (ref 65–99)
Potassium: 3.1 mmol/L — ABNORMAL LOW (ref 3.5–5.1)
Sodium: 138 mmol/L (ref 135–145)

## 2017-04-04 LAB — GLUCOSE, CAPILLARY
GLUCOSE-CAPILLARY: 122 mg/dL — AB (ref 65–99)
Glucose-Capillary: 128 mg/dL — ABNORMAL HIGH (ref 65–99)
Glucose-Capillary: 140 mg/dL — ABNORMAL HIGH (ref 65–99)
Glucose-Capillary: 108 mg/dL — ABNORMAL HIGH (ref 65–99)

## 2017-04-04 LAB — TROPONIN I
TROPONIN I: 0.03 ng/mL — AB (ref <0.03)
TROPONIN I: 0.04 ng/mL — AB (ref <0.03)
Troponin I: <0.03 ng/mL (ref <0.03)

## 2017-04-04 LAB — PROTEIN, PLEURAL OR PERITONEAL FLUID: Total protein, fluid: <3 g/dL

## 2017-04-04 LAB — ECHOCARDIOGRAM COMPLETE
HEIGHTINCHES: 59 in
WEIGHTICAEL: 1515.2 [oz_av]

## 2017-04-04 LAB — LACTATE DEHYDROGENASE, PLEURAL OR PERITONEAL FLUID: LD FL: 81 U/L — AB (ref 3–23)

## 2017-04-04 LAB — TSH: TSH: 0.975 u[IU]/mL (ref 0.350–4.500)

## 2017-04-04 MED ORDER — SPIRONOLACTONE 25 MG PO TABS
12.5000 mg | ORAL_TABLET | Freq: Every day | ORAL | Status: DC
  Administered 2017-04-05 – 2017-04-08 (×3): 12.5 mg via ORAL
  Filled 2017-04-04 (×4): qty 1

## 2017-04-04 MED ORDER — LOPERAMIDE HCL 2 MG PO CAPS
4.0000 mg | ORAL_CAPSULE | ORAL | Status: DC | PRN
  Administered 2017-04-04 – 2017-04-06 (×6): 4 mg via ORAL
  Filled 2017-04-04 (×6): qty 2

## 2017-04-04 MED ORDER — MAGNESIUM SULFATE 2 GM/50ML IV SOLN
2.0000 g | Freq: Once | INTRAVENOUS | Status: AC
  Administered 2017-04-04: 2 g via INTRAVENOUS
  Filled 2017-04-04: qty 50

## 2017-04-04 MED ORDER — LOSARTAN POTASSIUM 25 MG PO TABS
25.0000 mg | ORAL_TABLET | Freq: Every day | ORAL | Status: DC
  Administered 2017-04-05 – 2017-04-06 (×2): 25 mg via ORAL
  Filled 2017-04-04 (×2): qty 1

## 2017-04-04 MED ORDER — POTASSIUM CHLORIDE CRYS ER 20 MEQ PO TBCR
40.0000 meq | EXTENDED_RELEASE_TABLET | Freq: Two times a day (BID) | ORAL | Status: AC
  Administered 2017-04-04 (×2): 40 meq via ORAL
  Filled 2017-04-04 (×2): qty 2

## 2017-04-04 MED ORDER — CARVEDILOL 6.25 MG PO TABS
6.2500 mg | ORAL_TABLET | Freq: Two times a day (BID) | ORAL | Status: DC
  Administered 2017-04-04 – 2017-04-06 (×4): 6.25 mg via ORAL
  Filled 2017-04-04 (×6): qty 1

## 2017-04-04 MED ORDER — POTASSIUM CHLORIDE CRYS ER 20 MEQ PO TBCR
20.0000 meq | EXTENDED_RELEASE_TABLET | Freq: Two times a day (BID) | ORAL | Status: DC
  Administered 2017-04-05: 20 meq via ORAL
  Filled 2017-04-04: qty 1

## 2017-04-04 MED ORDER — SACUBITRIL-VALSARTAN 24-26 MG PO TABS: 1.0000 | ORAL_TABLET | Freq: Two times a day (BID) | ORAL | Status: DC

## 2017-04-04 NOTE — Progress Notes (Signed)
This is unasighned patient and we are on ER call and were called by telemetry for consullt. Thus Its [facility] Patient. Note by [facility] was by mistake as we were called.

## 2017-04-04 NOTE — Consult Note (Signed)
MEDICATION RELATED CONSULT NOTE - INITIAL   Pharmacy Consult for electrolytes Indication: hypokalemia due to diuresis  No Known Allergies  Patient Measurements: Height: 4\' 11"  (149.9 cm) Weight: 94 lb 11.2 oz (43 kg) IBW/kg (Calculated) : 43.2 Adjusted Body Weight:   Vital Signs: Temp: 98.4 F (36.9 C) (11/29 0730) Temp Source: Oral (11/29 0730) BP: 115/80 (11/29 1001) Pulse Rate: 93 (11/29 1001) Intake/Output from previous day: 11/28 0701 - 11/29 0700 In: -  Out: 2100 [Urine:2100] Intake/Output from this shift: Total I/O In: 360 [P.O.:360] Out: -   Labs: Recent Labs    04/03/17 1527 04/04/17 0453  WBC 10.2 7.6  HGB 11.6* 10.3*  HCT 35.5 32.0*  PLT 366 339  CREATININE 0.64 0.62  ALBUMIN 3.2*  --   PROT 6.6  --   AST 15  --   ALT 14  --   ALKPHOS 98  --   BILITOT 0.6  --    Estimated Creatinine Clearance: 55.2 mL/min (by C-G formula based on SCr of 0.62 mg/dL).   Microbiology: No results found for this or any previous visit (from the past 720 hour(s)).  Medical History: Past Medical History:  Diagnosis Date  . CHF (congestive heart failure) ([place]) 04/03/2017   a. type unknown; b. echo pending  . Diabetes ([place])   . GERD (gastroesophageal reflux disease)   . HLD (hyperlipidemia)   . Hypertension   . Pneumonia 02/19/2017    Medications:  Scheduled:  . amLODipine  5 mg Oral Daily  . aspirin EC  81 mg Oral Daily  . enoxaparin (LOVENOX) injection  40 mg Subcutaneous Q24H  . furosemide  40 mg Intravenous Q12H  . insulin aspart  0-5 Units Subcutaneous QHS  . insulin aspart  0-9 Units Subcutaneous TID WC  . lisinopril  10 mg Oral Daily  . metoprolol tartrate  25 mg Oral BID  . [START ON 04/05/2017] potassium chloride  20 mEq Oral BID  . potassium chloride  40 mEq Oral BID    Assessment: Pt is a 51 year old female who presents with what appears to be new onset HF. Pt is being diuresis with IV lasix 40mg  BID. KCl this AM was 3.1. KCL 40 MEQ BID ordered  by cardiology. Awaiting results of Mg add on. Pharmacy was consulted to manage electrolytes.  Goal of Therapy:  K 4-5 Mg 2-2.4  Plan:  Agree with KCL 40 MEQ BID. Continue this and recheck in the AM. Follow up Mg level and replace as needed. Follow for changes in diuretics as KCL orders might need to be adjusted  Ramond Dial, Pharm.D, BCPS Clinical Pharmacist  04/04/2017,10:23 AM

## 2017-04-04 NOTE — Discharge Instructions (Signed)
Heart Failure Clinic appointment on April 15 2017 at 2:40pm with Darylene Price, [place]. Please call [phone] to reschedule.

## 2017-04-04 NOTE — Progress Notes (Signed)
    Echo showed reduced EF of 20-25%. No prior for comparison. Will stop amlodipine given her know known cardiomyopathy. Will transition from Lopressor to [place] given her cardiomyoapthy. Outside cardiology group had patient ordered for Entresto to start on 11/30 at 8 AM (she last took lisinopril at 10:03 AM on 11/29) and would not have had a 36 hour wash out. I have cancelled this order for [facility] and placed the patient on losartan to begin on 11/30. She can be transitioned from losartan to [facility] on 12/1 if tolerated or as an outpatient. She will be scheduled for cardiac cath on 11/30 with Dr. Fletcher Anon, MD. NPO after midnight. Risks and benefits of cardiac catheterization have been discussed with the patient including risks of bleeding, bruising, infection, kidney damage, stroke, heart attack, and death. The patient understands these risks and is willing to proceed with the procedure. All questions have been answered and concerns listened to.

## 2017-04-04 NOTE — Progress Notes (Signed)
Talked to Dr. Posey Pronto, Gus Height about patient's BP still at 107/87, patient has been running soft throughout the shift. Patient has scheduled Losartan and Coreg, order to hold and to give the Coreg. No other concern at the moment. RN will continue to monitor.

## 2017-04-04 NOTE — Progress Notes (Signed)
[facility] [place] NAME: Erin Hayes    MR#:  [id number]  DATE OF BIRTH:  03/05/1966  SUBJECTIVE:  Patient came in with increasing bilateral lower extremity edema.  She was little short of breath. Feels a lot better today.  Denies smoking. REVIEW OF SYSTEMS:   Review of Systems  Constitutional: Negative for chills, fever and weight loss.  HENT: Negative for ear discharge, ear pain and nosebleeds.   Eyes: Negative for blurred vision, pain and discharge.  Respiratory: Negative for sputum production, shortness of breath, wheezing and stridor.   Cardiovascular: Negative for chest pain, palpitations, orthopnea and PND.  Gastrointestinal: Negative for abdominal pain, diarrhea, nausea and vomiting.  Genitourinary: Negative for frequency and urgency.  Musculoskeletal: Negative for back pain and joint pain.  Neurological: Negative for sensory change, speech change, focal weakness and weakness.  Psychiatric/Behavioral: Negative for depression and hallucinations. The patient is not nervous/anxious.    Tolerating Diet:yes Tolerating PT: pending  DRUG ALLERGIES:  No Known Allergies  VITALS:  Blood pressure 115/80, pulse 93, temperature 98.4 F (36.9 C), temperature source Oral, resp. rate 16, height 4\' 11"  (1.499 m), weight 43 kg (94 lb 11.2 oz), SpO2 97 %.  PHYSICAL EXAMINATION:   Physical Exam  GENERAL:  51 y.o.-year-old patient lying in the bed with no acute distress.  EYES: Pupils equal, round, reactive to light and accommodation. No scleral icterus. Extraocular muscles intact.  HEENT: Head atraumatic, normocephalic. Oropharynx and nasopharynx clear.  NECK:  Supple, no jugular venous distention. No thyroid enlargement, no tenderness.  LUNGS: Decreased breath sounds bilaterally more on the left than right, no wheezing, rales, rhonchi. No use of accessory muscles of respiration.  CARDIOVASCULAR: S1, S2 normal. No murmurs, rubs, or  gallops.  ABDOMEN: Soft, nontender, nondistended. Bowel sounds present. No organomegaly or mass.  EXTREMITIES: No cyanosis, clubbing  ++ edema b/l.    NEUROLOGIC: Cranial nerves II through XII are intact. No focal Motor or sensory deficits b/l.   PSYCHIATRIC:  patient is alert and oriented x 3.  SKIN: No obvious rash, lesion, or ulcer.   LABORATORY PANEL:  CBC Recent Labs  Lab 04/04/17 0453  WBC 7.6  HGB 10.3*  HCT 32.0*  PLT 339    Chemistries  Recent Labs  Lab 04/03/17 1527 04/04/17 0453 04/04/17 1045  NA 138 138  --   K 3.5 3.1*  --   CL 104 101  --   CO2 24 25  --   GLUCOSE 118* 120*  --   BUN 14 12  --   CREATININE 0.64 0.62  --   CALCIUM 8.6* 8.0*  --   MG  --   --  1.4*  AST 15  --   --   ALT 14  --   --   ALKPHOS 98  --   --   BILITOT 0.6  --   --    Cardiac Enzymes Recent Labs  Lab 04/04/17 1045  TROPONINI 0.04*   RADIOLOGY:  Dg Chest 2 View  Result Date: 04/03/2017 CLINICAL DATA:  Increased BILATERAL foot and ankle swelling for 1 week, rash at feet, pain in feet RIGHT greater than LEFT, history of diabetes mellitus, hypertension EXAM: CHEST  2 VIEW COMPARISON:  02/18/2017 FINDINGS: Enlargement of cardiac silhouette with pulmonary vascular congestion. Moderate to large LEFT pleural effusion increased since previous exam. Significantly increased LEFT basilar atelectasis. Question mild pulmonary edema in the RIGHT perihilar and basilar regions. Upper  lungs grossly clear. No pneumothorax. Bones demineralized. IMPRESSION: Enlargement of cardiac silhouette with pulmonary vascular congestion and question mild pulmonary edema. Significant interval increase in LEFT pleural effusion and basilar atelectasis. Electronically Signed   By: Lavonia Dana M.D.   On: 04/03/2017 17:26   ASSESSMENT AND PLAN:  51 year old female with past medical history of diabetes, hypertension, GERD who presents to the hospital due to worsening lower extremity edema and noted to be in  congestive heart failure.  1. acute CHF-this is a suspected diagnosis given patient's worsening lower extremity edema and ongoing orthopnea. -Unclear this is systolic versus diastolic presently -Echo shows EF of 20-25%.  No regional wall motion abnormality.  There is pleural effusion and suspected mass about 7 cm in the left pleural area -Have ordered ultrasound-guided thoracentesis for the left side.  Will also do CT chest without contrast to make sure there is no underlying mass -Diuresis with IV Lasix, follow I's and O's and daily weights.  -cardiology consult with Dr. Rockey Situ appreciated.  -Continue metoprolol, lisinopril  2. Diabetes type 2 without complication -hold her Actos for now  - Place on sliding scale insulin. Follow blood sugars  3. Essential hypertension-continue metoprolol, lisinopril, Norvasc.  4.  DVT prophylaxis Lovenox  Case discussed with Care Management/Social Worker. Management plans discussed with the patient, family and they are in agreement.  CODE STATUS: full  DVT Prophylaxis: Lovenox  TOTAL TIME TAKING CARE OF THIS PATIENT: 30 minutes.  >50% time spent on counselling and coordination of care  POSSIBLE D/C IN 1-2 DAYS, DEPENDING ON CLINICAL CONDITION.  Note: This dictation was prepared with Dragon dictation along with smaller phrase technology. Any transcriptional errors that result from this process are unintentional.  Fritzi Mandes M.D on 04/04/2017 at 1:32 PM  Between 7am to 6pm - Pager - [phone]  After 6pm go to [email or web address] - password [email or web address] [facility]  Office  [phone]  CC: Primary care physician; Patient, No Pcp Per

## 2017-04-04 NOTE — Consult Note (Signed)
Erin Hayes is a 51 y.o. female  [id number]  Primary Cardiologist: New to [facility], Dr. Neoma Laming Reason for Consultation: CHF exacerbation  HPI: 51yo female with a past medical history of DM, GERD, HTN, and acute kidney injury with no known history of CHF presented to ER with bilateral lower extremity edema.    Review of Systems: Reports mild LE edema, improving with lasix. Denies shortness of breath and chest pain.  Past Medical History:  Diagnosis Date  . CHF (congestive heart failure) ([place]) 04/03/2017   a. type unknown; b. echo pending  . Diabetes ([place])   . GERD (gastroesophageal reflux disease)   . HLD (hyperlipidemia)   . Hypertension   . Pneumonia 02/19/2017    Facility-Administered Medications Prior to Admission  Medication Dose Route Frequency Provider Last Rate Last Dose  . pioglitazone (ACTOS) tablet 15 mg  15 mg Oral Daily Jerrol Banana., MD       Medications Prior to Admission  Medication Sig Dispense Refill  . amLODipine (NORVASC) 5 MG tablet Take 5 mg by mouth daily.    [name] aspirin 81 MG tablet Take 81 mg by mouth daily.    [name] lisinopril (PRINIVIL,ZESTRIL) 10 MG tablet Take 1 tablet (10 mg total) by mouth daily. 30 tablet 5  . loperamide (IMODIUM) 2 MG capsule Take 2 capsules (4 mg total) by mouth as needed for diarrhea or loose stools. 30 capsule 0  . metoprolol tartrate (LOPRESSOR) 25 MG tablet Take 1 tablet (25 mg total) by mouth 2 (two) times daily. 60 tablet 2  . albuterol (PROVENTIL HFA;VENTOLIN HFA) 108 (90 Base) MCG/ACT inhaler Inhale 2 puffs into the lungs every 4 (four) hours as needed for wheezing or shortness of breath. 1 Inhaler 3  . Blood Glucose Monitoring Suppl (FIFTY50 GLUCOSE METER 2.0) w/Device KIT Frequency:PHARMDIR   Dosage:0.0     Instructions:  Note:use as directed by [place] (up to four times daily) Dose: 1 LANCET    . glucose blood (BAYER CONTOUR TEST) test strip by Other route Two (2) times a day.        [name] amLODipine  5 mg Oral Daily  . aspirin EC  81 mg Oral Daily  . enoxaparin (LOVENOX) injection  40 mg Subcutaneous Q24H  . furosemide  40 mg Intravenous Q12H  . insulin aspart  0-5 Units Subcutaneous QHS  . insulin aspart  0-9 Units Subcutaneous TID WC  . lisinopril  10 mg Oral Daily  . metoprolol tartrate  25 mg Oral BID  . [START ON 04/05/2017] potassium chloride  20 mEq Oral BID  . potassium chloride  40 mEq Oral BID    Infusions:   No Known Allergies  Social History   Socioeconomic History  . Marital status: Single    Spouse name: Not on file  . Number of children: Not on file  . Years of education: Not on file  . Highest education level: Not on file  Social Needs  . Financial resource strain: Not on file  . Food insecurity - worry: Not on file  . Food insecurity - inability: Not on file  . Transportation needs - medical: Not on file  . Transportation needs - non-medical: Not on file  Occupational History  . Occupation: home maker  Tobacco Use  . Smoking status: Never Smoker  . Smokeless tobacco: Never Used  Substance and Sexual Activity  . Alcohol use: No  . Drug use: No  . Sexual activity:  Not Currently  Other Topics Concern  . Not on file  Social History Narrative  . Not on file    Family History  Problem Relation Age of Onset  . Lung cancer Mother   . Hypertension Father   . CAD Father        a. MI age 37  . COPD Neg Hx   . Diabetes Mellitus II Neg Hx     PHYSICAL EXAM: Vitals:   04/04/17 0730 04/04/17 1001  BP: 103/77 115/80  Pulse: 81 93  Resp: 16   Temp: 98.4 F (36.9 C)   SpO2: 97%      Intake/Output Summary (Last 24 hours) at 04/04/2017 1015 Last data filed at 04/04/2017 0932 Gross per 24 hour  Intake 360 ml  Output 2100 ml  Net -1740 ml    General:  Well appearing. No respiratory difficulty HEENT: normal Neck: supple. no JVD. Carotids 2+ bilat; no bruits. No lymphadenopathy or thryomegaly appreciated. Cor: PMI  nondisplaced. Regular rate & rhythm. No rubs, gallops or murmurs. Lungs: clear Abdomen: soft, nontender, nondistended. No hepatosplenomegaly. No bruits or masses. Good bowel sounds. Extremities: no cyanosis, clubbing, rash, edema Neuro: alert & oriented x 3, cranial nerves grossly intact. moves all 4 extremities w/o difficulty. Affect pleasant.  ECG: NSR 83bpm  Results for orders placed or performed during the hospital encounter of 04/03/17 (from the past 24 hour(s))  CBC with Differential     Status: Abnormal   Collection Time: 04/03/17  3:27 PM  Result Value Ref Range   WBC 10.2 3.6 - 11.0 K/uL   RBC 4.33 3.80 - 5.20 MIL/uL   Hemoglobin 11.6 (L) 12.0 - 16.0 g/dL   HCT 35.5 35.0 - 47.0 %   MCV 81.8 80.0 - 100.0 fL   MCH 26.7 26.0 - 34.0 pg   MCHC 32.6 32.0 - 36.0 g/dL   RDW 13.3 11.5 - 14.5 %   Platelets 366 150 - 440 K/uL   Neutrophils Relative % 79 %   Neutro Abs 8.1 (H) 1.4 - 6.5 K/uL   Lymphocytes Relative 14 %   Lymphs Abs 1.4 1.0 - 3.6 K/uL   Monocytes Relative 7 %   Monocytes Absolute 0.7 0.2 - 0.9 K/uL   Eosinophils Relative 1 %   Eosinophils Absolute 0.1 0 - 0.7 K/uL   Basophils Relative 1 %   Basophils Absolute 0.1 0 - 0.1 K/uL  Comprehensive metabolic panel     Status: Abnormal   Collection Time: 04/03/17  3:27 PM  Result Value Ref Range   Sodium 138 135 - 145 mmol/L   Potassium 3.5 3.5 - 5.1 mmol/L   Chloride 104 101 - 111 mmol/L   CO2 24 22 - 32 mmol/L   Glucose, Bld 118 (H) 65 - 99 mg/dL   BUN 14 6 - 20 mg/dL   Creatinine, Ser 0.64 0.44 - 1.00 mg/dL   Calcium 8.6 (L) 8.9 - 10.3 mg/dL   Total Protein 6.6 6.5 - 8.1 g/dL   Albumin 3.2 (L) 3.5 - 5.0 g/dL   AST 15 15 - 41 U/L   ALT 14 14 - 54 U/L   Alkaline Phosphatase 98 38 - 126 U/L   Total Bilirubin 0.6 0.3 - 1.2 mg/dL   GFR calc non Af Amer >60 >60 mL/min   GFR calc Af Amer >60 >60 mL/min   Anion gap 10 5 - 15  Brain natriuretic peptide     Status: Abnormal   Collection Time: 04/03/17  3:34  PM   Result Value Ref Range   B Natriuretic Peptide 1,287.0 (H) 0.0 - 100.0 pg/mL  Troponin I     Status: None   Collection Time: 04/03/17  3:34 PM  Result Value Ref Range   Troponin I <0.03 <0.03 ng/mL  Glucose, capillary     Status: Abnormal   Collection Time: 04/03/17  8:11 PM  Result Value Ref Range   Glucose-Capillary 143 (H) 65 - 99 mg/dL  Troponin I     Status: None   Collection Time: 04/03/17 11:09 PM  Result Value Ref Range   Troponin I <0.03 <0.03 ng/mL  Troponin I     Status: Abnormal   Collection Time: 04/04/17  4:53 AM  Result Value Ref Range   Troponin I 0.03 (HH) <0.03 ng/mL  Basic metabolic panel     Status: Abnormal   Collection Time: 04/04/17  4:53 AM  Result Value Ref Range   Sodium 138 135 - 145 mmol/L   Potassium 3.1 (L) 3.5 - 5.1 mmol/L   Chloride 101 101 - 111 mmol/L   CO2 25 22 - 32 mmol/L   Glucose, Bld 120 (H) 65 - 99 mg/dL   BUN 12 6 - 20 mg/dL   Creatinine, Ser 0.62 0.44 - 1.00 mg/dL   Calcium 8.0 (L) 8.9 - 10.3 mg/dL   GFR calc non Af Amer >60 >60 mL/min   GFR calc Af Amer >60 >60 mL/min   Anion gap 12 5 - 15  CBC     Status: Abnormal   Collection Time: 04/04/17  4:53 AM  Result Value Ref Range   WBC 7.6 3.6 - 11.0 K/uL   RBC 3.95 3.80 - 5.20 MIL/uL   Hemoglobin 10.3 (L) 12.0 - 16.0 g/dL   HCT 32.0 (L) 35.0 - 47.0 %   MCV 81.1 80.0 - 100.0 fL   MCH 26.2 26.0 - 34.0 pg   MCHC 32.2 32.0 - 36.0 g/dL   RDW 13.1 11.5 - 14.5 %   Platelets 339 150 - 440 K/uL  Glucose, capillary     Status: Abnormal   Collection Time: 04/04/17  7:46 AM  Result Value Ref Range   Glucose-Capillary 128 (H) 65 - 99 mg/dL   Comment 1 Notify RN    Dg Chest 2 View  Result Date: 04/03/2017 CLINICAL DATA:  Increased BILATERAL foot and ankle swelling for 1 week, rash at feet, pain in feet RIGHT greater than LEFT, history of diabetes mellitus, hypertension EXAM: CHEST  2 VIEW COMPARISON:  02/18/2017 FINDINGS: Enlargement of cardiac silhouette with pulmonary vascular  congestion. Moderate to large LEFT pleural effusion increased since previous exam. Significantly increased LEFT basilar atelectasis. Question mild pulmonary edema in the RIGHT perihilar and basilar regions. Upper lungs grossly clear. No pneumothorax. Bones demineralized. IMPRESSION: Enlargement of cardiac silhouette with pulmonary vascular congestion and question mild pulmonary edema. Significant interval increase in LEFT pleural effusion and basilar atelectasis. Electronically Signed   By: Lavonia Dana M.D.   On: 04/03/2017 17:26     ASSESSMENT AND PLAN: Lower extremity edema with new diagnosis of systolic congestive heart failure with BNP of 1287.   Echo: EF 20-25% with mild-moderate MR.   CHF: Continue IV lasix with potassium replacement with metoprolol '25mg'$ . Advise Entresto. Washout for ACE needed. Aldactone 12.'5mg'$ .   HTN: Well controlled this morning with amlodipine '5mg'$ . Transition from ACE to [facility], needs 36 hour washout.   Jake Bathe NP-C [phone]

## 2017-04-04 NOTE — Progress Notes (Signed)
*  PRELIMINARY RESULTS* Echocardiogram 2D Echocardiogram has been performed.  Erin Hayes 04/04/2017, 10:24 AM

## 2017-04-04 NOTE — Progress Notes (Signed)
Talked to Dr. Jannifer Franklin about patient's BP of 100/70, patient has scheduled 40 mg IV Lasix, order to hold for now. No other concern at the moment. RN will continue to monitor.

## 2017-04-04 NOTE — Plan of Care (Signed)
Nutrition Education Note  RD consulted for nutrition education regarding CHF.  Met with patient at bedside. She reports she has a good appetite and has been eating well. She eats 3 meals per day. Breakfast is usually eggs and toast. Lunch is a sandwich. Dinner is usually from [employer] that her daughter picks up for her. She may have hotdogs or hamburgers. Her snacks during the day are chips. She drinks tea and water during the day. Patient reports she does not need any more education on diabetes as she has had it for 3-4 years now. She reports that when she first got diagnosed with diabetes she lost about 20 lbs. She has since gained her weight back and has been weight stable for over a year now. Currently has mild pitting edema to BLE.  RD provided "Heart Failure Nutrition Therapy" handout from the Academy of [employer] and Dietetics. Reviewed patient's dietary recall. Provided examples on ways to decrease sodium intake in diet. Discouraged intake of processed foods and use of salt shaker. Encouraged fresh fruits and vegetables as well as whole grain sources of carbohydrates to maximize fiber intake.   RD discussed why it is important for patient to adhere to diet recommendations, and emphasized the role of fluids, foods to avoid, and importance of weighing self daily. Teach back method used.  Expect good compliance.  Body mass index is 19.13 kg/m. Pt meets criteria for normal weight based on current BMI.  Current diet order is Heart Healthy/Carbohydrate Modified, patient is consuming approximately 95% of meals at this time. Labs and medications reviewed. No further nutrition interventions warranted at this time. RD contact information provided. If additional nutrition issues arise, please re-consult RD.   Willey Blade, MS, [place], LDN Office: [phone] Pager: [phone] After Hours/Weekend Pager: [phone]

## 2017-04-04 NOTE — Progress Notes (Signed)
This is in reference to Dr. Laurelyn Sickle note to avoid confusion by the treating team.   Note by [facility] is not a mistake. The consult was placed on 11/28 at 16:44 when [facility] was on unassigned. The consult was also placed for [facility] to specifically see the patient. Thus, [facility] did no wrong and correctly saw the patient. 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

## 2017-04-04 NOTE — Consult Note (Signed)
Cardiology Consultation:   Patient ID: Erin Hayes; [id number]; Feb 21, 1966   Admit date: 04/03/2017 Date of Consult: 04/04/2017  Primary Care Provider: Patient, No Pcp Per Primary Cardiologist: New to [facility] - consult by [place]   Patient Profile:   Erin Hayes is a 51 y.o. female with a hx of poorly controlled DM, HTN, HLD, chronic diarrhea, GERD, and recent PNA who is being seen today for the evaluation of acute CHF at the request of Dr. Verdell Carmine.  History of Present Illness:   Erin Hayes has not previously known cardiac history. She has been dealing with chronic, daily diarrhea for the past 6 months, at least with an unrevealing work up per her. She was recently admitted to [facility] in 02/2017 for HCAP and treated with ABX with improvement in symptoms, though cough never fully resolved. Over the past 2 weeks seh has noted a significant increase in LE swelling to just above the bilateral knees. She has had stable orthopnea. She has denied any SOB, cough, PND, early satiety, or abdominal distension. She eats out at restaurants for nearly every meal and accompanies her food with several sweet teas. She denies any smoking, alcohol, or illegal drug history. She has poorly controlled DM and HTN. [facility] Because of her worsening LE swelling she returned to [facility] on 11/28 where she was found to have acute CHF, type unknown. CXR showed pulmonary vascular congestion with mild pulmonary edema and significant interval increase in left pleural effusion. BNP elevated at 1287. Troponin <0.03-->0.03, BUN 24-->25, SCr 0.64-->0.62, Na 138 x 2, K+ 3.5-->3.1, glucose 118-->120, WBC 10.2-->7.6, HGB 11.6-->10.3, PLT 366, albumin 3.2. She was given IV Lasix with 2.1 L UOP to date and improving LE swelling. Weight 105-->94 pounds. BP stable. Oxygen saturations in the upper 90s% on room air. No complaints this morning. Echo is pending.    Past Medical History:  Diagnosis Date  .  CHF (congestive heart failure) ([place]) 04/03/2017   a. type unknown; b. echo pending  . Diabetes ([place])   . GERD (gastroesophageal reflux disease)   . HLD (hyperlipidemia)   . Hypertension   . Pneumonia 02/19/2017    Past Surgical History:  Procedure Laterality Date  . COLONOSCOPY WITH PROPOFOL N/A 01/21/2017   Procedure: COLONOSCOPY WITH PROPOFOL;  Surgeon: Jonathon Bellows, MD;  Location: [facility] ENDOSCOPY;  Service: Gastroenterology;  Laterality: N/A;  . ESOPHAGOGASTRODUODENOSCOPY (EGD) WITH PROPOFOL N/A 01/21/2017   Procedure: ESOPHAGOGASTRODUODENOSCOPY (EGD) WITH PROPOFOL;  Surgeon: Jonathon Bellows, MD;  Location: [facility] ENDOSCOPY;  Service: Gastroenterology;  Laterality: N/A;  . FLEXIBLE SIGMOIDOSCOPY N/A 11/04/2016   Procedure: FLEXIBLE SIGMOIDOSCOPY;  Surgeon: Wilford Corner, MD;  Location: [facility] ENDOSCOPY;  Service: Endoscopy;  Laterality: N/A;  . LITHOTRIPSY       Home Meds: Prior to Admission medications   Medication Sig Start Date End Date Taking? Authorizing Provider  amLODipine (NORVASC) 5 MG tablet Take 5 mg by mouth daily.   Yes [provider]  aspirin 81 MG tablet Take 81 mg by mouth daily.   Yes [provider]  lisinopril (PRINIVIL,ZESTRIL) 10 MG tablet Take 1 tablet (10 mg total) by mouth daily. 01/10/17  Yes Jerrol Banana., MD  loperamide (IMODIUM) 2 MG capsule Take 2 capsules (4 mg total) by mouth as needed for diarrhea or loose stools. 11/06/16  Yes Dustin Flock, MD  metoprolol tartrate (LOPRESSOR) 25 MG tablet Take 1 tablet (25 mg total) by mouth 2 (two) times daily.  02/20/17  Yes Fritzi Mandes, MD  albuterol (PROVENTIL HFA;VENTOLIN HFA) 108 (90 Base) MCG/ACT inhaler Inhale 2 puffs into the lungs every 4 (four) hours as needed for wheezing or shortness of breath. 02/21/17   Doles-Johnson, Teah, NP  Blood Glucose Monitoring Suppl (FIFTY50 GLUCOSE METER 2.0) w/Device KIT Frequency:PHARMDIR   Dosage:0.0     Instructions:  Note:use as directed by [place] (up to four times daily) Dose: 1 LANCET 05/13/13   [provider]  glucose blood (BAYER CONTOUR TEST) test strip by Other route Two (2) times a day. 05/13/13   [provider]    Inpatient Medications: Scheduled Meds: . amLODipine  5 mg Oral Daily  . aspirin EC  81 mg Oral Daily  . enoxaparin (LOVENOX) injection  40 mg Subcutaneous Q24H  . furosemide  40 mg Intravenous Q12H  . insulin aspart  0-5 Units Subcutaneous QHS  . insulin aspart  0-9 Units Subcutaneous TID WC  . lisinopril  10 mg Oral Daily  . metoprolol tartrate  25 mg Oral BID   Continuous Infusions:  PRN Meds: acetaminophen **OR** acetaminophen, albuterol, loperamide, ondansetron **OR** ondansetron (ZOFRAN) IV  Allergies:  No Known Allergies  Social History:   Social History   Socioeconomic History  . Marital status: Single    Spouse name: Not on file  . Number of children: Not on file  . Years of education: Not on file  . Highest education level: Not on file  Social Needs  . Financial resource strain: Not on file  . Food insecurity - worry: Not on file  . Food insecurity - inability: Not on file  . Transportation needs - medical: Not on file  . Transportation needs - non-medical: Not on file  Occupational History  . Occupation: home maker  Tobacco Use  . Smoking status: Never Smoker  . Smokeless tobacco: Never Used  Substance and Sexual Activity  . Alcohol use: No  . Drug use: No  . Sexual activity: Not Currently  Other Topics Concern  . Not on file  Social History Narrative  . Not on file     Family History:  Family History  Problem Relation Age of Onset  . Lung cancer Mother   . Hypertension Father   . CAD Father        a. MI age 30  . COPD Neg Hx   . Diabetes Mellitus II Neg Hx     ROS:  Review of Systems  Constitutional: Positive for malaise/fatigue. Negative for chills, diaphoresis, fever and weight loss.  HENT: Negative for congestion.   Eyes: Negative for  discharge and redness.  Respiratory: Positive for cough and shortness of breath. Negative for hemoptysis, sputum production and wheezing.   Cardiovascular: Positive for orthopnea and leg swelling. Negative for chest pain, palpitations, claudication and PND.  Gastrointestinal: Positive for diarrhea. Negative for abdominal pain, blood in stool, constipation, heartburn, melena, nausea and vomiting.  Genitourinary: Negative for hematuria.  Musculoskeletal: Negative for falls and myalgias.  Skin: Negative for rash.  Neurological: Positive for weakness. Negative for dizziness, tingling, tremors, sensory change, speech change, focal weakness and loss of consciousness.  Endo/Heme/Allergies: Does not bruise/bleed easily.  Psychiatric/Behavioral: Negative for substance abuse. The patient is not nervous/anxious.   All other systems reviewed and are negative.     Physical Exam/Data:   Vitals:   04/03/17 1455 04/03/17 1957 04/04/17 0439 04/04/17 0730  BP: (!) 141/106 (!) 144/100 103/73 103/77  Pulse: 100 (!) 115 84 81  Resp: _0 Temp: 99.4 F (37.4 C)  98.2 F (36.8 C) 98.4 F (36.9 C)  TempSrc: Oral  Oral Oral  SpO2: 99% 97% 98% 97%  Weight: 98 lb (44.5 kg) 105 lb 12.8 oz (48 kg) 94 lb 11.2 oz (43 kg)   Height: 4' 11" (1.499 m) 4' 11" (1.499 m)      Intake/Output Summary (Last 24 hours) at 04/04/2017 0818 Last data filed at 04/04/2017 0441 Gross per 24 hour  Intake -  Output 2100 ml  Net -2100 ml   Filed Weights   04/03/17 1455 04/03/17 1957 04/04/17 0439  Weight: 98 lb (44.5 kg) 105 lb 12.8 oz (48 kg) 94 lb 11.2 oz (43 kg)   Body mass index is 19.13 kg/m.   Physical Exam: General: Well developed, well nourished, in no acute distress. Head: Normocephalic, atraumatic, sclera non-icteric, no xanthomas, nares without discharge.  Neck: Negative for carotid bruits. JVD not elevated. Lungs: Diminished breath sounds along bilateral bases. Breathing is unlabored. Heart: RRR with  S1 S2. No murmurs, rubs, or gallops appreciated. Abdomen: Soft, non-tender, non-distended with normoactive bowel sounds. No hepatomegaly. No rebound/guarding. No obvious abdominal masses. Msk:  Strength and tone appear normal for age. Extremities: No clubbing or cyanosis. 1+ bilateral pitting edema 3/4 up on the shins. Distal pedal pulses are 2+ and equal bilaterally. Poor hygiene noted.  Neuro: Alert and oriented X 3. No facial asymmetry. No focal deficit. Moves all extremities spontaneously. Psych:  Responds to questions appropriately with a normal affect.   EKG:  The EKG was personally reviewed and demonstrates: sinus tachycardia, 104 bpm, left axis deviation, nonspecific IVCD, prior anterior infarct, nonspecific st/t changes Telemetry:  Telemetry was personally reviewed and demonstrates: NSR  Weights: Filed Weights   04/03/17 1455 04/03/17 1957 04/04/17 0439  Weight: 98 lb (44.5 kg) 105 lb 12.8 oz (48 kg) 94 lb 11.2 oz (43 kg)    Relevant CV Studies: TTE pending   Laboratory Data:  Chemistry Recent Labs  Lab 04/03/17 1527 04/04/17 0453  NA 138 138  K 3.5 3.1*  CL 104 101  CO2 24 25  GLUCOSE 118* 120*  BUN 14 12  CREATININE 0.64 0.62  CALCIUM 8.6* 8.0*  GFRNONAA >60 >60  GFRAA >60 >60  ANIONGAP 10 12    Recent Labs  Lab 04/03/17 1527  PROT 6.6  ALBUMIN 3.2*  AST 15  ALT 14  ALKPHOS 98  BILITOT 0.6   Hematology Recent Labs  Lab 04/03/17 1527 04/04/17 0453  WBC 10.2 7.6  RBC 4.33 3.95  HGB 11.6* 10.3*  HCT 35.5 32.0*  MCV 81.8 81.1  MCH 26.7 26.2  MCHC 32.6 32.2  RDW 13.3 13.1  PLT 366 339   Cardiac Enzymes Recent Labs  Lab 04/03/17 1534 04/03/17 2309 04/04/17 0453  TROPONINI <0.03 <0.03 0.03*   No results for input(s): TROPIPOC in the last 168 hours.  BNP Recent Labs  Lab 04/03/17 1534  BNP 1,287.0*    DDimer No results for input(s): DDIMER in the last 168 hours.  Radiology/Studies:  Dg Chest 2 View  Result Date:  04/03/2017 IMPRESSION: Enlargement of cardiac silhouette with pulmonary vascular congestion and question mild pulmonary edema. Significant interval increase in LEFT pleural effusion and basilar atelectasis. Electronically Signed   By: Lavonia Dana M.D.   On: 04/03/2017 17:26    Assessment and Plan:   1. Acute CHF: -Type unknown -Await echo -Agree with IV Lasix for diuresis with KCl repletion -If  echo demonstrates reduced EF or WMA consistent with ischemia she would need ischemic evaluation prior to discharge after she has been adequately diuresed -Metoprolol and lisinopril -Escalate HF medications is indicated based on echo, vitals, and patient response -CHF education, needs to stop eating out at restaurants for nearly every meal -Daily weights with strict Is and Os -Check tsh -Some of her LE swelling may be 2/2 amlodipine, consider changing if LE persists s/p diuresis   2. Elevated troponin: -Minimally elevated at 0.03, continue to cycle until peak/flat trend is noted -No symptoms of chest pain -Echo pending as above -If echo demonstrates preserved EF, consider outpatient nuclear stress testing  3. Hypertensive heart disease: -BP improved -Likely playing a role in the above -Continue current medications  4. Hypokalemia: -Replete to goal > 4.0 -Check magnesium  5. HLD: -Recent lipid panel 12/2016 -LDL 48  6. DM2: -Per IM  7. Chronic diarrhea: -Uncertain etiology -Per IM   For questions or updates, please contact [place] Please consult [email or web address] for contact info under Cardiology/STEMI.   Signed, Christell Faith, PA-C [place] Pager: [phone] 04/04/2017, 8:18 AM

## 2017-04-05 ENCOUNTER — Encounter
Admission: EM | Disposition: A | Discharge status: Home / Self Care | Payer: Self-pay | Source: Home / Self Care | Attending: Internal Medicine

## 2017-04-05 DIAGNOSIS — I509 Heart failure, unspecified: Secondary | ICD-10-CM

## 2017-04-05 HISTORY — PX: LEFT HEART CATH AND CORONARY ANGIOGRAPHY: CATH118249

## 2017-04-05 LAB — BASIC METABOLIC PANEL
Anion gap: 9 (ref 5–15)
BUN: 18 mg/dL (ref 6–20)
CHLORIDE: 100 mmol/L — AB (ref 101–111)
CO2: 29 mmol/L (ref 22–32)
CREATININE: 0.68 mg/dL (ref 0.44–1.00)
Calcium: 7.9 mg/dL — ABNORMAL LOW (ref 8.9–10.3)
GFR calc non Af Amer: >60 mL/min (ref >60)
Glucose, Bld: 126 mg/dL — ABNORMAL HIGH (ref 65–99)
POTASSIUM: 3.2 mmol/L — AB (ref 3.5–5.1)
SODIUM: 138 mmol/L (ref 135–145)

## 2017-04-05 LAB — GLUCOSE, CAPILLARY
GLUCOSE-CAPILLARY: 119 mg/dL — AB (ref 65–99)
GLUCOSE-CAPILLARY: 129 mg/dL — AB (ref 65–99)
GLUCOSE-CAPILLARY: 122 mg/dL — AB (ref 65–99)
GLUCOSE-CAPILLARY: 205 mg/dL — AB (ref 65–99)
Glucose-Capillary: 163 mg/dL — ABNORMAL HIGH (ref 65–99)

## 2017-04-05 LAB — MISC LABCORP TEST (SEND OUT): Labcorp test code: 19558

## 2017-04-05 LAB — PROTIME-INR
INR: 1.18
PROTHROMBIN TIME: 14.9 s (ref 11.4–15.2)

## 2017-04-05 LAB — MAGNESIUM: MAGNESIUM: 1.8 mg/dL (ref 1.7–2.4)

## 2017-04-05 SURGERY — LEFT HEART CATH AND CORONARY ANGIOGRAPHY
Anesthesia: Moderate Sedation

## 2017-04-05 MED ORDER — SODIUM CHLORIDE 0.9 % IV SOLN: 250.0000 mL | INTRAVENOUS | Status: DC | PRN

## 2017-04-05 MED ORDER — FENTANYL CITRATE (PF) 100 MCG/2ML IJ SOLN
INTRAMUSCULAR | Status: AC
  Filled 2017-04-05: qty 2

## 2017-04-05 MED ORDER — FUROSEMIDE 40 MG PO TABS: 40.0000 mg | ORAL_TABLET | Freq: Two times a day (BID) | ORAL | Status: DC

## 2017-04-05 MED ORDER — POTASSIUM CHLORIDE CRYS ER 20 MEQ PO TBCR: 40.0000 meq | EXTENDED_RELEASE_TABLET | Freq: Once | ORAL | Status: DC

## 2017-04-05 MED ORDER — POTASSIUM CHLORIDE 20 MEQ PO PACK
20.0000 meq | PACK | Freq: Two times a day (BID) | ORAL | Status: DC
  Administered 2017-04-05 – 2017-04-07 (×5): 20 meq via ORAL
  Filled 2017-04-05 (×5): qty 1

## 2017-04-05 MED ORDER — HEPARIN SODIUM (PORCINE) 1000 UNIT/ML IJ SOLN
INTRAMUSCULAR | Status: AC
  Filled 2017-04-05: qty 1

## 2017-04-05 MED ORDER — VERAPAMIL HCL 2.5 MG/ML IV SOLN
INTRAVENOUS | Status: AC
  Filled 2017-04-05: qty 2

## 2017-04-05 MED ORDER — POTASSIUM CHLORIDE 20 MEQ PO PACK
40.0000 meq | PACK | Freq: Once | ORAL | Status: AC
  Administered 2017-04-05: 40 meq via ORAL

## 2017-04-05 MED ORDER — SODIUM CHLORIDE 0.9% FLUSH
3.0000 mL | Freq: Two times a day (BID) | INTRAVENOUS | Status: DC
  Administered 2017-04-05: 3 mL via INTRAVENOUS

## 2017-04-05 MED ORDER — FUROSEMIDE 10 MG/ML IJ SOLN
40.0000 mg | Freq: Two times a day (BID) | INTRAMUSCULAR | Status: DC
  Administered 2017-04-05 – 2017-04-06 (×2): 40 mg via INTRAVENOUS
  Filled 2017-04-05 (×2): qty 4

## 2017-04-05 MED ORDER — MIDAZOLAM HCL 2 MG/2ML IJ SOLN
INTRAMUSCULAR | Status: DC | PRN
  Administered 2017-04-05: 1 mg via INTRAVENOUS

## 2017-04-05 MED ORDER — SODIUM CHLORIDE 0.9% FLUSH: 3.0000 mL | INTRAVENOUS | Status: DC | PRN

## 2017-04-05 MED ORDER — SODIUM CHLORIDE 0.9% FLUSH
3.0000 mL | Freq: Two times a day (BID) | INTRAVENOUS | Status: DC
  Administered 2017-04-05 – 2017-04-08 (×7): 3 mL via INTRAVENOUS

## 2017-04-05 MED ORDER — HEPARIN (PORCINE) IN NACL 2-0.9 UNIT/ML-% IJ SOLN
INTRAMUSCULAR | Status: AC
  Filled 2017-04-05: qty 500

## 2017-04-05 MED ORDER — LIDOCAINE HCL (PF) 1 % IJ SOLN
INTRAMUSCULAR | Status: AC
  Filled 2017-04-05: qty 30

## 2017-04-05 MED ORDER — MIDAZOLAM HCL 2 MG/2ML IJ SOLN
INTRAMUSCULAR | Status: AC
  Filled 2017-04-05: qty 2

## 2017-04-05 MED ORDER — HEPARIN SODIUM (PORCINE) 1000 UNIT/ML IJ SOLN
INTRAMUSCULAR | Status: DC | PRN
  Administered 2017-04-05: 2000 [IU] via INTRAVENOUS

## 2017-04-05 MED ORDER — SODIUM CHLORIDE 0.9 % IV SOLN: INTRAVENOUS | Status: DC

## 2017-04-05 SURGICAL SUPPLY — 7 items
CATH OPTITORQUE JACKY 4.0 5F (CATHETERS) ×3 IMPLANT
DEVICE RAD TR BAND REGULAR (VASCULAR PRODUCTS) ×3 IMPLANT
GLIDESHEATH SLEND SS 6F .021 (SHEATH) ×3 IMPLANT
KIT MANI 3VAL PERCEP (MISCELLANEOUS) ×3 IMPLANT
PACK CARDIAC CATH (CUSTOM PROCEDURE TRAY) ×3 IMPLANT
WIRE HITORQ VERSACORE ST 145CM (WIRE) ×3 IMPLANT
WIRE ROSEN-J .035X260CM (WIRE) ×3 IMPLANT

## 2017-04-05 NOTE — Progress Notes (Signed)
Talked to Dr. Tressia Miners about patient's BP of 108/82 patient has scheduled IV lasix 40 mg and Coreg, order to hold coreg and to give IV lasix as post cardiac cath recommends IV diuresis. No other concern at the moment. RN will continue to monitor.

## 2017-04-05 NOTE — Progress Notes (Signed)
[facility] [facility] [place] NAME: Erin Hayes    MR#:  [id number]  DATE OF BIRTH:  May 19, 1965  SUBJECTIVE:  CHIEF COMPLAINT:   Chief Complaint  Patient presents with  . Foot Swelling   - Much improved breathing now. Pedal edema has resolved as well. For cardiac catheterization today  REVIEW OF SYSTEMS:  Review of Systems  Constitutional: Negative for chills, fever and malaise/fatigue.  HENT: Negative for congestion, ear discharge, hearing loss and nosebleeds.   Eyes: Negative for blurred vision and double vision.  Respiratory: Positive for shortness of breath. Negative for cough and wheezing.   Cardiovascular: Negative for chest pain and palpitations.  Gastrointestinal: Negative for abdominal pain, constipation, diarrhea, nausea and vomiting.  Genitourinary: Negative for dysuria.  Neurological: Negative for dizziness, speech change, focal weakness, seizures and headaches.  Psychiatric/Behavioral: Negative for depression.    DRUG ALLERGIES:  No Known Allergies  VITALS:  Blood pressure 104/81, pulse 81, temperature 98.3 F (36.8 C), temperature source Oral, resp. rate 18, height 4\' 11"  (1.499 m), weight 45.5 kg (100 lb 4.8 oz), SpO2 100 %.  PHYSICAL EXAMINATION:  Physical Exam  GENERAL:  51 y.o.-year-old patient lying in the bed with no acute distress.  EYES: Pupils equal, round, reactive to light and accommodation. No scleral icterus. Extraocular muscles intact.  HEENT: Head atraumatic, normocephalic. Oropharynx and nasopharynx clear.  NECK:  Supple, no jugular venous distention. No thyroid enlargement, no tenderness.  LUNGS: Normal breath sounds bilaterally, no wheezing, rales,rhonchi or crepitation. No use of accessory muscles of respiration.  CARDIOVASCULAR: S1, S2 normal. No rubs, or gallops. 2/6 systolic murmur noted. ABDOMEN: Soft, nontender, nondistended. Bowel sounds present. No organomegaly or mass.  EXTREMITIES: No   cyanosis, or clubbing. Minimal pedal edema noted. NEUROLOGIC: Cranial nerves II through XII are intact. Muscle strength 5/5 in all extremities. Sensation intact. Gait not checked.  PSYCHIATRIC: The patient is alert and oriented x 3.  SKIN: No obvious rash, lesion, or ulcer.    LABORATORY PANEL:   CBC Recent Labs  Lab 04/04/17 0453  WBC 7.6  HGB 10.3*  HCT 32.0*  PLT 339   ------------------------------------------------------------------------------------------------------------------  Chemistries  Recent Labs  Lab 04/03/17 1527  04/05/17 0408  NA 138   < > 138  K 3.5   < > 3.2*  CL 104   < > 100*  CO2 24   < > 29  GLUCOSE 118*   < > 126*  BUN 14   < > 18  CREATININE 0.64   < > 0.68  CALCIUM 8.6*   < > 7.9*  MG  --    < > 1.8  AST 15  --   --   ALT 14  --   --   ALKPHOS 98  --   --   BILITOT 0.6  --   --    < > = values in this interval not displayed.   ------------------------------------------------------------------------------------------------------------------  Cardiac Enzymes Recent Labs  Lab 04/04/17 1045  TROPONINI 0.04*   ------------------------------------------------------------------------------------------------------------------  RADIOLOGY:  Dg Chest 2 View  Result Date: 04/03/2017 CLINICAL DATA:  Increased BILATERAL foot and ankle swelling for 1 week, rash at feet, pain in feet RIGHT greater than LEFT, history of diabetes mellitus, hypertension EXAM: CHEST  2 VIEW COMPARISON:  02/18/2017 FINDINGS: Enlargement of cardiac silhouette with pulmonary vascular congestion. Moderate to large LEFT pleural effusion increased since previous exam. Significantly increased LEFT basilar atelectasis. Question mild pulmonary edema in the  RIGHT perihilar and basilar regions. Upper lungs grossly clear. No pneumothorax. Bones demineralized. IMPRESSION: Enlargement of cardiac silhouette with pulmonary vascular congestion and question mild pulmonary edema. Significant  interval increase in LEFT pleural effusion and basilar atelectasis. Electronically Signed   By: Lavonia Dana M.D.   On: 04/03/2017 17:26   Ct Chest Wo Contrast  Result Date: 04/04/2017 CLINICAL DATA:  51 year old female who recently presented with lower extremity swelling, found have opacified left lower lung. Status post left side thoracentesis with ultrasound guidance today prior to this exam. 300 mL pleural fluid obtained. EXAM: CT CHEST WITHOUT CONTRAST TECHNIQUE: Multidetector CT imaging of the chest was performed following the standard protocol without IV contrast. COMPARISON:  CT Abdomen and Pelvis 10/02/2016. Chest radiographs 04/03/2017. FINDINGS: Cardiovascular: New cardiomegaly since 10/02/2016. Biventricular and to a lesser extent atrial enlargement with small pericardial effusion (simple fluid density suggests transudate). Associated fluid in the superior pericardial recess. Vascular patency is not evaluated in the absence of IV contrast. No calcified aortic or coronary artery atherosclerosis is evident. Mediastinum/Nodes: Negative. Lungs/Pleura: Small bilateral layering pleural effusions, slightly greater on the left. These are new since 10/02/2016. Major airways are patent. Left lower lobe consolidation, partially sparing the superior segment. Relatively mild patchy and platelike opacity in the lingula. Mild septal thickening in the right lower lobe. Mild patchy and confluent opacity in the right costophrenic angle. Upper Abdomen: Surgically absent gallbladder as before. No upper abdominal free fluid is evident. Negative noncontrast visible liver, spleen, pancreas, adrenal glands, kidneys, and bowel. Musculoskeletal: Mild to moderate scoliosis. No acute osseous abnormality identified. Other findings: New generalized subcutaneous edema, most apparent along the posterior body wall which appears diffusely swollen compared to 10/02/2016. IMPRESSION: 1. New Anasarca and Cardiomegaly (with new with  biventricular enlargement) since the CT on 10/02/2016. Small superimposed new Pericardial Effusion. 2. Small layering Pleural Effusions greater on the left. Superimposed left lower lobe consolidation is nonspecific but suspicious for Pneumonia. 3. Mild nonspecific opacity in both the lingula and right lower lobe, probably atelectasis. Electronically Signed   By: Genevie Ann M.D.   On: 04/04/2017 15:05   US Thoracentesis Asp Pleural Space W/img Guide  Result Date: 04/04/2017 INDICATION: History of congestive heart failure, now with symptomatic left-sided pleural effusion. Please from ultrasound-guided thoracentesis for diagnostic and therapeutic purposes. EXAM: US THORACENTESIS ASP PLEURAL SPACE W/IMG GUIDE COMPARISON:  Chest radiograph - 04/03/2017 MEDICATIONS: None. COMPLICATIONS: None immediate. TECHNIQUE: Informed written consent was obtained from the patient after a discussion of the risks, benefits and alternatives to treatment. A timeout was performed prior to the initiation of the procedure. Initial ultrasound scanning demonstrates a small to moderate size left-sided pleural effusion. The lower chest was prepped and draped in the usual sterile fashion. 1% lidocaine was used for local anesthesia. An ultrasound image was saved for documentation purposes. An 8 Fr Safe-T-Centesis catheter was introduced. The thoracentesis was performed. The catheter was removed and a dressing was applied. The patient tolerated the procedure well without immediate post procedural complication. The patient was escorted to have an upright chest radiograph. FINDINGS: A total of approximately 300 cc of serous fluid was removed. Requested samples were sent to the laboratory. IMPRESSION: Successful ultrasound-guided left sided thoracentesis yielding 300 cc of pleural fluid. Electronically Signed   By: Sandi Mariscal M.D.   On: 04/04/2017 15:41    EKG:   Orders placed or performed during the hospital encounter of 04/03/17  . EKG  12-Lead  . EKG 12-Lead  . EKG 12-Lead  .  EKG 12-Lead    ASSESSMENT AND PLAN:   51 year old female with past medical history significant for hypertension, hyperlipidemia, diabetes mellitus, GERD presented with worsening shortness of breath and also lower extremity edema  #1 acute systolic congestive heart failure exacerbation-echocardiogram this admission showed EF of 20-25% with global hypokinesis -Appreciate cardiology consult. -For cardiac catheterization today to look into any ischemic causes. -Soft blood pressures, so continue Coreg, also on losartan and Aldactone. -Transition to entresto as outpatient whenever BP can tolerate -Change Lasix to oral today  #2 hypokalemia-being replaced  #3 DM- sugars well controlled on SSI, check a1c Continue to hold her Actos due to congestive heart failure  #4 DVT prophylaxis- lovenox   All the records are reviewed and case discussed with Care Management/Social Workerr. Management plans discussed with the patient, family and they are in agreement.  CODE STATUS: Full Code  TOTAL TIME TAKING CARE OF THIS PATIENT: 38 minutes.   POSSIBLE D/C TOMORROW, DEPENDING ON CLINICAL CONDITION.   Jostin Rue M.D on 04/05/2017 at 11:54 AM  Between 7am to 6pm - Pager - [phone]  After 6pm go to [email or web address] - password [email or web address] [facility]  Office  [phone]  CC: Primary care physician; Patient, No Pcp Per

## 2017-04-05 NOTE — Consult Note (Signed)
MEDICATION RELATED CONSULT NOTE - INITIAL   Pharmacy Consult for electrolytes Indication: hypokalemia due to diuresis  No Known Allergies  Patient Measurements: Height: 4\' 11"  (149.9 cm) Weight: 100 lb 4.8 oz (45.5 kg) IBW/kg (Calculated) : 43.2 Adjusted Body Weight:   Vital Signs: Temp: 99.6 F (37.6 C) (11/30 1215) Temp Source: Oral (11/30 0839) BP: 118/97 (11/30 1215) Pulse Rate: 82 (11/30 1215) Intake/Output from previous day: 11/29 0701 - 11/30 0700 In: 840 [P.O.:840] Out: 1000 [Urine:1000] Intake/Output from this shift: No intake/output data recorded.  Labs: Recent Labs    04/03/17 1527 04/04/17 0453 04/04/17 1045 04/05/17 0408  WBC 10.2 7.6  --   --   HGB 11.6* 10.3*  --   --   HCT 35.5 32.0*  --   --   PLT 366 339  --   --   CREATININE 0.64 0.62  --  0.68  MG  --   --  1.4* 1.8  ALBUMIN 3.2*  --   --   --   PROT 6.6  --   --   --   AST 15  --   --   --   ALT 14  --   --   --   ALKPHOS 98  --   --   --   BILITOT 0.6  --   --   --    Estimated Creatinine Clearance: 55.5 mL/min (by C-G formula based on SCr of 0.68 mg/dL).   Microbiology: No results found for this or any previous visit (from the past 720 hour(s)).  Medical History: Past Medical History:  Diagnosis Date  . CHF (congestive heart failure) ([place]) 04/03/2017   a. type unknown; b. echo pending  . Diabetes ([place])   . GERD (gastroesophageal reflux disease)   . HLD (hyperlipidemia)   . Hypertension   . Pneumonia 02/19/2017    Medications:  Scheduled:  . [MAR Hold] aspirin EC  81 mg Oral Daily  . [MAR Hold] carvedilol  6.25 mg Oral BID WC  . [MAR Hold] enoxaparin (LOVENOX) injection  40 mg Subcutaneous Q24H  . [MAR Hold] furosemide  40 mg Oral BID  . [MAR Hold] insulin aspart  0-5 Units Subcutaneous QHS  . [MAR Hold] insulin aspart  0-9 Units Subcutaneous TID WC  . [MAR Hold] losartan  25 mg Oral Daily  . [MAR Hold] potassium chloride  20 mEq Oral BID  . [MAR Hold] potassium chloride   40 mEq Oral Once  . sodium chloride flush  3 mL Intravenous Q12H  . [MAR Hold] spironolactone  12.5 mg Oral Daily    Assessment: Pt is a 51 year old female who presents with what appears to be new onset HF. Pt is being diuresis with IV lasix 40mg  BID. KCl this AM was 3.1. KCL 40 MEQ BID ordered by cardiology. Awaiting results of Mg add on. Pharmacy was consulted to manage electrolytes.  11/30 K =3.2, received 48mEq on 11/29 and currently ordered KCL 40mEq PO BID. Patient is being transitioned from IV furosemide to PO.  Goal of Therapy:  K 4-5 Mg 2-2.4  Plan:  MD has ordered KCl 74mEq PO x 1 in addition to scheduled 81mEq bid order. Will follow up on AM labs.  Paulina Fusi, PharmD, BCPS 04/05/2017 1:28 PM

## 2017-04-05 NOTE — Progress Notes (Signed)
Attempted second IV pre procedure x 2 without success. Cath lab staff aware will notify MD

## 2017-04-05 NOTE — Progress Notes (Signed)
Progress Note  Patient Name: Erin Hayes Date of Encounter: 04/05/2017  Primary Cardiologist: Johnny Bridge, MD   Subjective   Breathing improved.  Edema less.  NPO for cath this AM.  Inpatient Medications    Scheduled Meds: . aspirin EC  81 mg Oral Daily  . carvedilol  6.25 mg Oral BID WC  . enoxaparin (LOVENOX) injection  40 mg Subcutaneous Q24H  . furosemide  40 mg Intravenous Q12H  . insulin aspart  0-5 Units Subcutaneous QHS  . insulin aspart  0-9 Units Subcutaneous TID WC  . losartan  25 mg Oral Daily  . potassium chloride  20 mEq Oral BID  . sodium chloride flush  3 mL Intravenous Q12H  . spironolactone  12.5 mg Oral Daily   Continuous Infusions: . sodium chloride    . [START ON 04/06/2017] sodium chloride     PRN Meds: sodium chloride, acetaminophen **OR** acetaminophen, albuterol, loperamide, ondansetron **OR** ondansetron (ZOFRAN) IV, sodium chloride flush   Vital Signs    Vitals:   04/04/17 2034 04/05/17 0432 04/05/17 0500 04/05/17 0839  BP: 100/70 98/79  104/81  Pulse: 84 86  81  Resp: 18   18  Temp: 98.2 F (36.8 C) 98.9 F (37.2 C)  98.3 F (36.8 C)  TempSrc: Oral Oral  Oral  SpO2: 98% 100%  100%  Weight:   100 lb 4.8 oz (45.5 kg)   Height:        Intake/Output Summary (Last 24 hours) at 04/05/2017 1018 Last data filed at 04/04/2017 1839 Gross per 24 hour  Intake 480 ml  Output 1000 ml  Net -520 ml   Filed Weights   04/03/17 1957 04/04/17 0439 04/05/17 0500  Weight: 105 lb 12.8 oz (48 kg) 94 lb 11.2 oz (43 kg) 100 lb 4.8 oz (45.5 kg)    Physical Exam   GEN: Well nourished, well developed, in no acute distress.  HEENT: Grossly normal.  Neck: Supple, JVP ~ 12 cm, no carotid bruits, or masses. Cardiac: RRR, 2/6 syst murmur @ apex, no rubs, or gallops. No clubbing, cyanosis, 1+ right lower ext edema.  Radials/DP/PT 2+ and equal bilaterally.  Respiratory:  Respirations regular and unlabored, clear to auscultation bilaterally. GI: Soft,  nontender, nondistended, BS + x 4. MS: no deformity or atrophy. Skin: warm and dry, no rash. Neuro:  Strength and sensation are intact. Psych: AAOx3.  Normal affect.  Labs    Chemistry Recent Labs  Lab 04/03/17 1527 04/04/17 0453 04/05/17 0408  NA 138 138 138  K 3.5 3.1* 3.2*  CL 104 101 100*  CO2 24 25 29   GLUCOSE 118* 120* 126*  BUN 14 12 18   CREATININE 0.64 0.62 0.68  CALCIUM 8.6* 8.0* 7.9*  PROT 6.6  --   --   ALBUMIN 3.2*  --   --   AST 15  --   --   ALT 14  --   --   ALKPHOS 98  --   --   BILITOT 0.6  --   --   GFRNONAA >60 >60 >60  GFRAA >60 >60 >60  ANIONGAP 10 12 9      Hematology Recent Labs  Lab 04/03/17 1527 04/04/17 0453  WBC 10.2 7.6  RBC 4.33 3.95  HGB 11.6* 10.3*  HCT 35.5 32.0*  MCV 81.8 81.1  MCH 26.7 26.2  MCHC 32.6 32.2  RDW 13.3 13.1  PLT 366 339    Cardiac Enzymes Recent Labs  Lab 04/03/17 1534 04/03/17  2309 04/04/17 0453 04/04/17 1045  TROPONINI <0.03 <0.03 0.03* 0.04*      BNP Recent Labs  Lab 04/03/17 1534  BNP 1,287.0*      Radiology    Dg Chest 2 View  Result Date: 04/03/2017 CLINICAL DATA:  Increased BILATERAL foot and ankle swelling for 1 week, rash at feet, pain in feet RIGHT greater than LEFT, history of diabetes mellitus, hypertension EXAM: CHEST  2 VIEW COMPARISON:  02/18/2017 FINDINGS: Enlargement of cardiac silhouette with pulmonary vascular congestion. Moderate to large LEFT pleural effusion increased since previous exam. Significantly increased LEFT basilar atelectasis. Question mild pulmonary edema in the RIGHT perihilar and basilar regions. Upper lungs grossly clear. No pneumothorax. Bones demineralized. IMPRESSION: Enlargement of cardiac silhouette with pulmonary vascular congestion and question mild pulmonary edema. Significant interval increase in LEFT pleural effusion and basilar atelectasis. Electronically Signed   By: Lavonia Dana M.D.   On: 04/03/2017 17:26   Ct Chest Wo Contrast  Result Date:  04/04/2017 CLINICAL DATA:  51 year old female who recently presented with lower extremity swelling, found have opacified left lower lung. Status post left side thoracentesis with ultrasound guidance today prior to this exam. 300 mL pleural fluid obtained. EXAM: CT CHEST WITHOUT CONTRAST TECHNIQUE: Multidetector CT imaging of the chest was performed following the standard protocol without IV contrast. COMPARISON:  CT Abdomen and Pelvis 10/02/2016. Chest radiographs 04/03/2017. FINDINGS: Cardiovascular: New cardiomegaly since 10/02/2016. Biventricular and to a lesser extent atrial enlargement with small pericardial effusion (simple fluid density suggests transudate). Associated fluid in the superior pericardial recess. Vascular patency is not evaluated in the absence of IV contrast. No calcified aortic or coronary artery atherosclerosis is evident. Mediastinum/Nodes: Negative. Lungs/Pleura: Small bilateral layering pleural effusions, slightly greater on the left. These are new since 10/02/2016. Major airways are patent. Left lower lobe consolidation, partially sparing the superior segment. Relatively mild patchy and platelike opacity in the lingula. Mild septal thickening in the right lower lobe. Mild patchy and confluent opacity in the right costophrenic angle. Upper Abdomen: Surgically absent gallbladder as before. No upper abdominal free fluid is evident. Negative noncontrast visible liver, spleen, pancreas, adrenal glands, kidneys, and bowel. Musculoskeletal: Mild to moderate scoliosis. No acute osseous abnormality identified. Other findings: New generalized subcutaneous edema, most apparent along the posterior body wall which appears diffusely swollen compared to 10/02/2016. IMPRESSION: 1. New Anasarca and Cardiomegaly (with new with biventricular enlargement) since the CT on 10/02/2016. Small superimposed new Pericardial Effusion. 2. Small layering Pleural Effusions greater on the left. Superimposed left lower  lobe consolidation is nonspecific but suspicious for Pneumonia. 3. Mild nonspecific opacity in both the lingula and right lower lobe, probably atelectasis. Electronically Signed   By: Genevie Ann M.D.   On: 04/04/2017 15:05   US Thoracentesis Asp Pleural Space W/img Guide  Result Date: 04/04/2017 INDICATION: History of congestive heart failure, now with symptomatic left-sided pleural effusion. Please from ultrasound-guided thoracentesis for diagnostic and therapeutic purposes. EXAM: US THORACENTESIS ASP PLEURAL SPACE W/IMG GUIDE COMPARISON:  Chest radiograph - 04/03/2017 MEDICATIONS: None. COMPLICATIONS: None immediate. TECHNIQUE: Informed written consent was obtained from the patient after a discussion of the risks, benefits and alternatives to treatment. A timeout was performed prior to the initiation of the procedure. Initial ultrasound scanning demonstrates a small to moderate size left-sided pleural effusion. The lower chest was prepped and draped in the usual sterile fashion. 1% lidocaine was used for local anesthesia. An ultrasound image was saved for documentation purposes. An 8 Fr Safe-T-Centesis catheter was introduced.  The thoracentesis was performed. The catheter was removed and a dressing was applied. The patient tolerated the procedure well without immediate post procedural complication. The patient was escorted to have an upright chest radiograph. FINDINGS: A total of approximately 300 cc of serous fluid was removed. Requested samples were sent to the laboratory. IMPRESSION: Successful ultrasound-guided left sided thoracentesis yielding 300 cc of pleural fluid. Electronically Signed   By: Sandi Mariscal M.D.   On: 04/04/2017 15:41    Telemetry    RSR - Personally Reviewed  Cardiac Studies   2D Echocardiogram 11.29.2018  Study Conclusions   - Left ventricle: The cavity size was normal. There was moderate   concentric hypertrophy. Systolic function was severely reduced.   The estimated  ejection fraction was in the range of 20% to 25%.   Diffuse hypokinesis. Regional wall motion abnormalities cannot be   excluded. Features are consistent with a pseudonormal left   ventricular filling pattern, with concomitant abnormal relaxation   and increased filling pressure (grade 2 diastolic dysfunction). - Mitral valve: There was mild to moderate regurgitation. - Right ventricle: Systolic function was normal. - Pericardium, extracardiac: A small pericardial effusion was   identified, circumferential.   Impressions:   - Left pleural effusion noted. Unable to exclude mass in the   pleural space measuring 7.5 x 4.5 cm.   Patient Profile     51 y.o. female with a h/o poorly controlled DM, HTN, HL, chronic diarreha, GERD, and recent PNA who was admitted 11/28 with CHF and found to have mild trop elevation and LV dysfxn w/ an EF of 20-25%, Gr2 DD.  Assessment & Plan    1.  Acute systolic CHF: Admitted with progressive lower ext edema with mild edema on CXR and BNP of 1287.  Echo showed EF of 20-25% w/ Gr2 DD, mod MR, and small circumferential pericardial effusion.  Responding well to IV diuretics - minus 2.2L for admission.  Still with mild volume overload.  Cont lasix this AM.  Cath today.  Assess EDP @ time of cath later this AM. Cont  blocker, ARB, spiro.  No bp room for titration.  Would like to transition to entresto if BP allows and if pt can afford.  2.  L Pl effusion:  Initially noted on echo.  F/u CT w/ small layering effusion on L - ? PNA.  Now s/p thoracentesis.  Breathing stable and lungs clear this AM.    3.  Hypokalemia:  Supplementation ordered.  4.  HL:  LDL 48 in August.  5.  DM II:  Per IM.  6.  Normocytic anemia:  ? Etiology. Stable.  Signed, Murray Hodgkins, NP  04/05/2017, 10:18 AM    For questions or updates, please contact   Please consult [email or web address] for contact info under Cardiology/STEMI.

## 2017-04-06 DIAGNOSIS — I5023 Acute on chronic systolic (congestive) heart failure: Secondary | ICD-10-CM

## 2017-04-06 LAB — BASIC METABOLIC PANEL WITH GFR
Anion gap: 10 (ref 5–15)
BUN: 22 mg/dL — ABNORMAL HIGH (ref 6–20)
CO2: 30 mmol/L (ref 22–32)
Calcium: 8.1 mg/dL — ABNORMAL LOW (ref 8.9–10.3)
Chloride: 98 mmol/L — ABNORMAL LOW (ref 101–111)
Creatinine, Ser: 0.67 mg/dL (ref 0.44–1.00)
GFR calc Af Amer: >60 mL/min
GFR calc non Af Amer: >60 mL/min
Glucose, Bld: 144 mg/dL — ABNORMAL HIGH (ref 65–99)
Potassium: 3.4 mmol/L — ABNORMAL LOW (ref 3.5–5.1)
Sodium: 138 mmol/L (ref 135–145)

## 2017-04-06 LAB — GLUCOSE, CAPILLARY
GLUCOSE-CAPILLARY: 230 mg/dL — AB (ref 65–99)
Glucose-Capillary: 131 mg/dL — ABNORMAL HIGH (ref 65–99)
Glucose-Capillary: 104 mg/dL — ABNORMAL HIGH (ref 65–99)
Glucose-Capillary: 192 mg/dL — ABNORMAL HIGH (ref 65–99)

## 2017-04-06 LAB — HEMOGLOBIN A1C
HEMOGLOBIN A1C: 6.8 % — AB (ref 4.8–5.6)
Mean Plasma Glucose: 148.46 mg/dL

## 2017-04-06 MED ORDER — SACUBITRIL-VALSARTAN 24-26 MG PO TABS
1.0000 | ORAL_TABLET | Freq: Two times a day (BID) | ORAL | Status: DC
  Administered 2017-04-07 (×2): 1 via ORAL
  Filled 2017-04-06 (×2): qty 1

## 2017-04-06 MED ORDER — FUROSEMIDE 10 MG/ML IJ SOLN
60.0000 mg | Freq: Two times a day (BID) | INTRAMUSCULAR | Status: DC
  Administered 2017-04-06 – 2017-04-07 (×2): 60 mg via INTRAVENOUS
  Filled 2017-04-06 (×2): qty 6

## 2017-04-06 MED ORDER — ENOXAPARIN SODIUM 30 MG/0.3ML ~~LOC~~ SOLN
30.0000 mg | SUBCUTANEOUS | Status: DC
  Administered 2017-04-06 – 2017-04-07 (×2): 30 mg via SUBCUTANEOUS
  Filled 2017-04-06 (×2): qty 0.3

## 2017-04-06 MED ORDER — POTASSIUM CHLORIDE CRYS ER 20 MEQ PO TBCR
40.0000 meq | EXTENDED_RELEASE_TABLET | Freq: Once | ORAL | Status: AC
  Administered 2017-04-06: 40 meq via ORAL
  Filled 2017-04-06: qty 2

## 2017-04-06 MED ORDER — SACUBITRIL-VALSARTAN 24-26 MG PO TABS: 1.0000 | ORAL_TABLET | Freq: Two times a day (BID) | ORAL | Status: DC

## 2017-04-06 NOTE — Progress Notes (Signed)
[facility] [place] NAME: Kerri-Anne Haeberle    MR#:  [id number]  DATE OF BIRTH:  [birth year]  SUBJECTIVE:  CHIEF COMPLAINT:   Chief Complaint  Patient presents with  . Foot Swelling   - Appears much better, on room air. -Status post cardiac catheterization yesterday showing nonischemic cardiomyopathy and nonobstructive CAD. -Continue IV Lasix  REVIEW OF SYSTEMS:  Review of Systems  Constitutional: Negative for chills, fever and malaise/fatigue.  HENT: Negative for congestion, ear discharge, hearing loss and nosebleeds.   Eyes: Negative for blurred vision and double vision.  Respiratory: Positive for shortness of breath. Negative for cough and wheezing.   Cardiovascular: Negative for chest pain and palpitations.  Gastrointestinal: Negative for abdominal pain, constipation, diarrhea, nausea and vomiting.  Genitourinary: Negative for dysuria.  Neurological: Negative for dizziness, speech change, focal weakness, seizures and headaches.  Psychiatric/Behavioral: Negative for depression.    DRUG ALLERGIES:  No Known Allergies  VITALS:  Blood pressure 114/90, pulse 92, temperature 99 F (37.2 C), temperature source Oral, resp. rate 18, height 4\' 11"  (1.499 m), weight 43 kg (94 lb 14.4 oz), SpO2 100 %.  PHYSICAL EXAMINATION:  Physical Exam  GENERAL:  51 y.o.-year-old patient lying in the bed with no acute distress.  EYES: Pupils equal, round, reactive to light and accommodation. No scleral icterus. Extraocular muscles intact.  HEENT: Head atraumatic, normocephalic. Oropharynx and nasopharynx clear.  NECK:  Supple, no jugular venous distention. No thyroid enlargement, no tenderness.  LUNGS: Normal breath sounds bilaterally, no wheezing, rales,rhonchi or crepitation. No use of accessory muscles of respiration.  CARDIOVASCULAR: S1, S2 normal. No rubs, or gallops. 2/6 systolic murmur noted. ABDOMEN: Soft, nontender, nondistended. Bowel sounds  present. No organomegaly or mass.  EXTREMITIES: No  cyanosis, or clubbing. no pedal edema noted. NEUROLOGIC: Cranial nerves II through XII are intact. Muscle strength 5/5 in all extremities. Sensation intact. Gait not checked.  PSYCHIATRIC: The patient is alert and oriented x 3.  SKIN: No obvious rash, lesion, or ulcer.    LABORATORY PANEL:   CBC Recent Labs  Lab 04/04/17 0453  WBC 7.6  HGB 10.3*  HCT 32.0*  PLT 339   ------------------------------------------------------------------------------------------------------------------  Chemistries  Recent Labs  Lab 04/03/17 1527  04/05/17 0408 04/06/17 0549  NA 138   < > 138 138  K 3.5   < > 3.2* 3.4*  CL 104   < > 100* 98*  CO2 24   < > 29 30  GLUCOSE 118*   < > 126* 144*  BUN 14   < > 18 22*  CREATININE 0.64   < > 0.68 0.67  CALCIUM 8.6*   < > 7.9* 8.1*  MG  --    < > 1.8  --   AST 15  --   --   --   ALT 14  --   --   --   ALKPHOS 98  --   --   --   BILITOT 0.6  --   --   --    < > = values in this interval not displayed.   ------------------------------------------------------------------------------------------------------------------  Cardiac Enzymes Recent Labs  Lab 04/04/17 1045  TROPONINI 0.04*   ------------------------------------------------------------------------------------------------------------------  RADIOLOGY:  Ct Chest Wo Contrast  Result Date: 04/04/2017 CLINICAL DATA:  51 year old female who recently presented with lower extremity swelling, found have opacified left lower lung. Status post left side thoracentesis with ultrasound guidance today prior to this exam. 300 mL pleural  fluid obtained. EXAM: CT CHEST WITHOUT CONTRAST TECHNIQUE: Multidetector CT imaging of the chest was performed following the standard protocol without IV contrast. COMPARISON:  CT Abdomen and Pelvis 10/02/2016. Chest radiographs 04/03/2017. FINDINGS: Cardiovascular: New cardiomegaly since 10/02/2016. Biventricular and to  a lesser extent atrial enlargement with small pericardial effusion (simple fluid density suggests transudate). Associated fluid in the superior pericardial recess. Vascular patency is not evaluated in the absence of IV contrast. No calcified aortic or coronary artery atherosclerosis is evident. Mediastinum/Nodes: Negative. Lungs/Pleura: Small bilateral layering pleural effusions, slightly greater on the left. These are new since 10/02/2016. Major airways are patent. Left lower lobe consolidation, partially sparing the superior segment. Relatively mild patchy and platelike opacity in the lingula. Mild septal thickening in the right lower lobe. Mild patchy and confluent opacity in the right costophrenic angle. Upper Abdomen: Surgically absent gallbladder as before. No upper abdominal free fluid is evident. Negative noncontrast visible liver, spleen, pancreas, adrenal glands, kidneys, and bowel. Musculoskeletal: Mild to moderate scoliosis. No acute osseous abnormality identified. Other findings: New generalized subcutaneous edema, most apparent along the posterior body wall which appears diffusely swollen compared to 10/02/2016. IMPRESSION: 1. New Anasarca and Cardiomegaly (with new with biventricular enlargement) since the CT on 10/02/2016. Small superimposed new Pericardial Effusion. 2. Small layering Pleural Effusions greater on the left. Superimposed left lower lobe consolidation is nonspecific but suspicious for Pneumonia. 3. Mild nonspecific opacity in both the lingula and right lower lobe, probably atelectasis. Electronically Signed   By: Genevie Ann M.D.   On: 04/04/2017 15:05   US Thoracentesis Asp Pleural Space W/img Guide  Result Date: 04/04/2017 INDICATION: History of congestive heart failure, now with symptomatic left-sided pleural effusion. Please from ultrasound-guided thoracentesis for diagnostic and therapeutic purposes. EXAM: US THORACENTESIS ASP PLEURAL SPACE W/IMG GUIDE COMPARISON:  Chest  radiograph - 04/03/2017 MEDICATIONS: None. COMPLICATIONS: None immediate. TECHNIQUE: Informed written consent was obtained from the patient after a discussion of the risks, benefits and alternatives to treatment. A timeout was performed prior to the initiation of the procedure. Initial ultrasound scanning demonstrates a small to moderate size left-sided pleural effusion. The lower chest was prepped and draped in the usual sterile fashion. 1% lidocaine was used for local anesthesia. An ultrasound image was saved for documentation purposes. An 8 Fr Safe-T-Centesis catheter was introduced. The thoracentesis was performed. The catheter was removed and a dressing was applied. The patient tolerated the procedure well without immediate post procedural complication. The patient was escorted to have an upright chest radiograph. FINDINGS: A total of approximately 300 cc of serous fluid was removed. Requested samples were sent to the laboratory. IMPRESSION: Successful ultrasound-guided left sided thoracentesis yielding 300 cc of pleural fluid. Electronically Signed   By: Sandi Mariscal M.D.   On: 04/04/2017 15:41    EKG:   Orders placed or performed during the hospital encounter of 04/03/17  . EKG 12-Lead  . EKG 12-Lead  . EKG 12-Lead  . EKG 12-Lead    ASSESSMENT AND PLAN:   51 year old female with past medical history significant for hypertension, hyperlipidemia, diabetes mellitus, GERD presented with worsening shortness of breath and also lower extremity edema  #1 acute systolic congestive heart failure exacerbation-echocardiogram this admission showed EF of 20-25% with global hypokinesis -Appreciate cardiology consult. -It is not ischemic cardiomyopathy. Patient had cardiac catheterization on 04/05/2017 showing nonobstructive CAD.. -Soft blood pressures, so continue Coreg, also on losartan and Aldactone. -Continue IV Lasix for another 1-2 days according to cardiology.  #2 hypokalemia-replaced  #3  DM-  sugars well controlled on SSI, a1c of 6.8 Continue to hold her Actos due to congestive heart failure  #4 DVT prophylaxis- lovenox  Encourage ambulation today   All the records are reviewed and case discussed with Care Management/Social Workerr. Management plans discussed with the patient, family and they are in agreement.  CODE STATUS: Full Code  TOTAL TIME TAKING CARE OF THIS PATIENT: 38 minutes.   POSSIBLE D/C TOMORROW, DEPENDING ON CLINICAL CONDITION.   Gladstone Lighter M.D on 04/06/2017 at 9:46 AM  Between 7am to 6pm - Pager - [phone]  After 6pm go to [email or web address] - password [email or web address] [facility]  Office  [phone]  CC: Primary care physician; Patient, No Pcp Per

## 2017-04-06 NOTE — Progress Notes (Signed)
Patient ID: Erin Hayes, female   DOB: Sep 25, 1965, 51 y.o.   MRN: [id number]   Progress Note  Patient Name: Erin Hayes Date of Encounter: 04/06/2017  Primary Cardiologist: Johnny Bridge, MD   Subjective   Breathing improving.  I/Os not particularly negative but she said she urinated a lot, not sure if everything is getting recorded.    LHC: Nonobstructive CAD.  Echo: EF 20-25%  Inpatient Medications    Scheduled Meds: . aspirin EC  81 mg Oral Daily  . carvedilol  6.25 mg Oral BID WC  . enoxaparin (LOVENOX) injection  40 mg Subcutaneous Q24H  . furosemide  60 mg Intravenous Q12H  . insulin aspart  0-5 Units Subcutaneous QHS  . insulin aspart  0-9 Units Subcutaneous TID WC  . potassium chloride  20 mEq Oral BID  . potassium chloride  40 mEq Oral Once  . sodium chloride flush  3 mL Intravenous Q12H  . spironolactone  12.5 mg Oral Daily   Continuous Infusions: . sodium chloride     PRN Meds: sodium chloride, acetaminophen **OR** acetaminophen, albuterol, loperamide, ondansetron **OR** ondansetron (ZOFRAN) IV, sodium chloride flush   Vital Signs    Vitals:   04/05/17 1615 04/05/17 2009 04/06/17 0652 04/06/17 0745  BP: 108/82 125/85 114/90 115/88  Pulse: 90 95 92 90  Resp: 15 18 18 20   Temp: 99 F (37.2 C) 98.8 F (37.1 C) 99 F (37.2 C)   TempSrc: Oral Oral Oral   SpO2: 96% 100% 100% 99%  Weight:   94 lb 14.4 oz (43 kg)   Height:        Intake/Output Summary (Last 24 hours) at 04/06/2017 1209 Last data filed at 04/06/2017 1036 Gross per 24 hour  Intake 720 ml  Output 1000 ml  Net -280 ml   Filed Weights   04/04/17 0439 04/05/17 0500 04/06/17 0652  Weight: 94 lb 11.2 oz (43 kg) 100 lb 4.8 oz (45.5 kg) 94 lb 14.4 oz (43 kg)    Physical Exam   General: NAD Neck: JVP 12 cm, no thyromegaly or thyroid nodule.  Lungs: Clear to auscultation bilaterally with normal respiratory effort. CV: Lateral PMI.  Heart regular S1/S2, no S3/S4, no murmur.  No peripheral  edema.   Abdomen: Soft, nontender, no hepatosplenomegaly, no distention.  Skin: Intact without lesions or rashes.  Neurologic: Alert and oriented x 3.  Psych: Normal affect. Extremities: No clubbing or cyanosis.  HEENT: Normal.    Labs    Chemistry Recent Labs  Lab 04/03/17 1527 04/04/17 0453 04/05/17 0408 04/06/17 0549  NA 138 138 138 138  K 3.5 3.1* 3.2* 3.4*  CL 104 101 100* 98*  CO2 24 25 29 30   GLUCOSE 118* 120* 126* 144*  BUN 14 12 18  22*  CREATININE 0.64 0.62 0.68 0.67  CALCIUM 8.6* 8.0* 7.9* 8.1*  PROT 6.6  --   --   --   ALBUMIN 3.2*  --   --   --   AST 15  --   --   --   ALT 14  --   --   --   ALKPHOS 98  --   --   --   BILITOT 0.6  --   --   --   GFRNONAA >60 >60 >60 >60  GFRAA >60 >60 >60 >60  ANIONGAP 10 12 9 10      Hematology Recent Labs  Lab 04/03/17 1527 04/04/17 0453  WBC 10.2 7.6  RBC 4.33  3.95  HGB 11.6* 10.3*  HCT 35.5 32.0*  MCV 81.8 81.1  MCH 26.7 26.2  MCHC 32.6 32.2  RDW 13.3 13.1  PLT 366 339    Cardiac Enzymes Recent Labs  Lab 04/03/17 1534 04/03/17 2309 04/04/17 0453 04/04/17 1045  TROPONINI <0.03 <0.03 0.03* 0.04*      BNP Recent Labs  Lab 04/03/17 1534  BNP 1,287.0*      Radiology    Ct Chest Wo Contrast  Result Date: 04/04/2017 CLINICAL DATA:  51 year old female who recently presented with lower extremity swelling, found have opacified left lower lung. Status post left side thoracentesis with ultrasound guidance today prior to this exam. 300 mL pleural fluid obtained. EXAM: CT CHEST WITHOUT CONTRAST TECHNIQUE: Multidetector CT imaging of the chest was performed following the standard protocol without IV contrast. COMPARISON:  CT Abdomen and Pelvis 10/02/2016. Chest radiographs 04/03/2017. FINDINGS: Cardiovascular: New cardiomegaly since 10/02/2016. Biventricular and to a lesser extent atrial enlargement with small pericardial effusion (simple fluid density suggests transudate). Associated fluid in the superior  pericardial recess. Vascular patency is not evaluated in the absence of IV contrast. No calcified aortic or coronary artery atherosclerosis is evident. Mediastinum/Nodes: Negative. Lungs/Pleura: Small bilateral layering pleural effusions, slightly greater on the left. These are new since 10/02/2016. Major airways are patent. Left lower lobe consolidation, partially sparing the superior segment. Relatively mild patchy and platelike opacity in the lingula. Mild septal thickening in the right lower lobe. Mild patchy and confluent opacity in the right costophrenic angle. Upper Abdomen: Surgically absent gallbladder as before. No upper abdominal free fluid is evident. Negative noncontrast visible liver, spleen, pancreas, adrenal glands, kidneys, and bowel. Musculoskeletal: Mild to moderate scoliosis. No acute osseous abnormality identified. Other findings: New generalized subcutaneous edema, most apparent along the posterior body wall which appears diffusely swollen compared to 10/02/2016. IMPRESSION: 1. New Anasarca and Cardiomegaly (with new with biventricular enlargement) since the CT on 10/02/2016. Small superimposed new Pericardial Effusion. 2. Small layering Pleural Effusions greater on the left. Superimposed left lower lobe consolidation is nonspecific but suspicious for Pneumonia. 3. Mild nonspecific opacity in both the lingula and right lower lobe, probably atelectasis. Electronically Signed   By: Genevie Ann M.D.   On: 04/04/2017 15:05   US Thoracentesis Asp Pleural Space W/img Guide  Result Date: 04/04/2017 INDICATION: History of congestive heart failure, now with symptomatic left-sided pleural effusion. Please from ultrasound-guided thoracentesis for diagnostic and therapeutic purposes. EXAM: US THORACENTESIS ASP PLEURAL SPACE W/IMG GUIDE COMPARISON:  Chest radiograph - 04/03/2017 MEDICATIONS: None. COMPLICATIONS: None immediate. TECHNIQUE: Informed written consent was obtained from the patient after a  discussion of the risks, benefits and alternatives to treatment. A timeout was performed prior to the initiation of the procedure. Initial ultrasound scanning demonstrates a small to moderate size left-sided pleural effusion. The lower chest was prepped and draped in the usual sterile fashion. 1% lidocaine was used for local anesthesia. An ultrasound image was saved for documentation purposes. An 8 Fr Safe-T-Centesis catheter was introduced. The thoracentesis was performed. The catheter was removed and a dressing was applied. The patient tolerated the procedure well without immediate post procedural complication. The patient was escorted to have an upright chest radiograph. FINDINGS: A total of approximately 300 cc of serous fluid was removed. Requested samples were sent to the laboratory. IMPRESSION: Successful ultrasound-guided left sided thoracentesis yielding 300 cc of pleural fluid. Electronically Signed   By: Sandi Mariscal M.D.   On: 04/04/2017 15:41    Telemetry  RSR - Personally Reviewed  Cardiac Studies   2D Echocardiogram 11.29.2018  Study Conclusions   - Left ventricle: The cavity size was normal. There was moderate   concentric hypertrophy. Systolic function was severely reduced.   The estimated ejection fraction was in the range of 20% to 25%.   Diffuse hypokinesis. Regional wall motion abnormalities cannot be   excluded. Features are consistent with a pseudonormal left   ventricular filling pattern, with concomitant abnormal relaxation   and increased filling pressure (grade 2 diastolic dysfunction). - Mitral valve: There was mild to moderate regurgitation. - Right ventricle: Systolic function was normal. - Pericardium, extracardiac: A small pericardial effusion was   identified, circumferential.   Impressions:   - Left pleural effusion noted. Unable to exclude mass in the   pleural space measuring 7.5 x 4.5 cm.   Patient Profile     51 y.o. female with a h/o poorly  controlled DM, HTN, HL, chronic diarreha, GERD, and recent PNA who was admitted 11/28 with CHF and found to have mild trop elevation and LV dysfxn w/ an EF of 20-25%, Gr2 DD.  Assessment & Plan    1.  Acute systolic CHF: Admitted with progressive lower ext edema with mild edema on CXR and BNP of 1287.  Echo showed EF of 20-25% w/ Gr2 DD, mod MR, and small circumferential pericardial effusion.  LHC showed no significant CAD => nonischemic cardiomyopathy.   - Continue current Coreg and spironolactone.  - Increase Lasix to 60 mg IV bid (difficult to gauge response, patient reports good urination but not reflective in I/Os).  She needs 1-2 more days diuresis, reassess in am.  - Stop losartan, will start Entresto 24/26 bid tomorrow (already had losartan today).  2.  L Pl effusion:  Initially noted on echo.  F/u CT w/ small layering effusion on L - ? PNA.  Now s/p thoracentesis.  Breathing stable and lungs clear this AM.   3.  Hypokalemia:  Supplementation ordered. 4.  DM II:  Per IM. 5.  Normocytic anemia:  ? Etiology. Stable.  Signed, Loralie Champagne, MD  04/06/2017, 12:09 PM    For questions or updates, please contact   Please consult [email or web address] for contact info under Cardiology/STEMI.

## 2017-04-06 NOTE — Progress Notes (Signed)
Anticoagulation monitoring(Lovenox):  51 yo  Female  ordered Lovenox 40 mg Q24h  Filed Weights   04/04/17 0439 04/05/17 0500 04/06/17 0652  Weight: 94 lb 11.2 oz (43 kg) 100 lb 4.8 oz (45.5 kg) 94 lb 14.4 oz (43 kg)   Body mass index is 19.17 kg/m.   Lab Results  Component Value Date   CREATININE 0.67 04/06/2017   CREATININE 0.68 04/05/2017   CREATININE 0.62 04/04/2017   Estimated Creatinine Clearance: 55.2 mL/min (by C-G formula based on SCr of 0.67 mg/dL). Hemoglobin & Hematocrit     Component Value Date/Time   HGB 10.3 (L) 04/04/2017 0453   HGB 11.3 12/13/2016 1812   HCT 32.0 (L) 04/04/2017 0453   HCT 35.7 12/13/2016 1812     Per Protocol for Patient with estCrcl > 30 ml/min and weight <45 kg, will transition to Lovenox 30 mg Q24h.

## 2017-04-06 NOTE — Progress Notes (Addendum)
Pharmacist - Prescriber Communication  Wash-out period from ACE/ARB to [facility] should be 36 hours. I moved the start time of Entresto from 04/07/17 at 10:00 to 04/07/17 at 22:00 since last dose of losartan was documented 04/06/17 at 09:09.   04/06/2017 15:22 spoke with Dr. Aundra Dubin - he is okay with starting Entresto 24 hours after ARB dose. Order re-entered for tomorrow AM.   Charlann Lange. [place], [place].D., BCPS Clinical Pharmacist 04/06/2017 12:16

## 2017-04-06 NOTE — Consult Note (Signed)
[facility] NOTE   Pharmacy Consult for electrolytes Indication: hypokalemia due to diuresis  No Known Allergies  Patient Measurements: Height: 4\' 11"  (149.9 cm) Weight: 94 lb 14.4 oz (43 kg) IBW/kg (Calculated) : 43.2 Adjusted Body Weight:   Vital Signs: Temp: 99 F (37.2 C) (12/01 0652) Temp Source: Oral (12/01 0652) BP: 115/88 (12/01 0745) Pulse Rate: 90 (12/01 0745) Intake/Output from previous day: 11/30 0701 - 12/01 0700 In: 360 [P.O.:360] Out: 1000 [Urine:1000] Intake/Output from this shift: Total I/O In: 360 [P.O.:360] Out: -   Labs: Recent Labs    04/03/17 1527 04/04/17 0453 04/04/17 1045 04/05/17 0408 04/06/17 0549  WBC 10.2 7.6  --   --   --   HGB 11.6* 10.3*  --   --   --   HCT 35.5 32.0*  --   --   --   PLT 366 339  --   --   --   CREATININE 0.64 0.62  --  0.68 0.67  MG  --   --  1.4* 1.8  --   ALBUMIN 3.2*  --   --   --   --   PROT 6.6  --   --   --   --   AST 15  --   --   --   --   ALT 14  --   --   --   --   ALKPHOS 98  --   --   --   --   BILITOT 0.6  --   --   --   --    Estimated Creatinine Clearance: 55.2 mL/min (by C-G formula based on SCr of 0.67 mg/dL).   Medical History: Past Medical History:  Diagnosis Date  . CHF (congestive heart failure) ([place]) 04/03/2017   a. type unknown; b. echo pending  . Diabetes ([place])   . GERD (gastroesophageal reflux disease)   . HLD (hyperlipidemia)   . Hypertension   . Pneumonia 02/19/2017    Medications:  Scheduled:  . aspirin EC  81 mg Oral Daily  . carvedilol  6.25 mg Oral BID WC  . enoxaparin (LOVENOX) injection  40 mg Subcutaneous Q24H  . furosemide  40 mg Intravenous Q12H  . insulin aspart  0-5 Units Subcutaneous QHS  . insulin aspart  0-9 Units Subcutaneous TID WC  . losartan  25 mg Oral Daily  . potassium chloride  20 mEq Oral BID  . sodium chloride flush  3 mL Intravenous Q12H  . spironolactone  12.5 mg Oral Daily    Assessment: Pt is a 51 year old female who  presents with what appears to be new onset HF. Pt is being diuresis with IV lasix 40mg  BID. Pharmacy was consulted to manage electrolytes.  11/29: KCl 3.1. KCL 40 MEQ BID ordered by cardiology. Awaiting results of Mg add on.   11/30 K =3.2, received 51mEq on 11/29 and currently ordered KCL 86mEq PO BID. Patient is being transitioned from IV furosemide to PO.  12/1: Pt back on IV furosemide 40 mg q12h. K 3.4  Goal of Therapy:  K 4-5 Mg 2-2.4  Plan:  Will order KCl 11mEq PO x 1 in addition to scheduled 67mEq bid order. Will follow up on AM labs. Will also check mag in AM.   Rayna Sexton, PharmD, BCPS Clinical Pharmacist 04/06/2017 11:28 AM

## 2017-04-07 DIAGNOSIS — I5043 Acute on chronic combined systolic (congestive) and diastolic (congestive) heart failure: Secondary | ICD-10-CM

## 2017-04-07 LAB — BASIC METABOLIC PANEL
Anion gap: 8 (ref 5–15)
BUN: 20 mg/dL (ref 6–20)
CHLORIDE: 97 mmol/L — AB (ref 101–111)
CO2: 30 mmol/L (ref 22–32)
Calcium: 8.3 mg/dL — ABNORMAL LOW (ref 8.9–10.3)
Creatinine, Ser: 0.64 mg/dL (ref 0.44–1.00)
GFR calc Af Amer: >60 mL/min (ref >60)
GFR calc non Af Amer: >60 mL/min (ref >60)
Glucose, Bld: 154 mg/dL — ABNORMAL HIGH (ref 65–99)
POTASSIUM: 4.8 mmol/L (ref 3.5–5.1)
SODIUM: 135 mmol/L (ref 135–145)

## 2017-04-07 LAB — MAGNESIUM: MAGNESIUM: 1.4 mg/dL — AB (ref 1.7–2.4)

## 2017-04-07 LAB — GLUCOSE, CAPILLARY
GLUCOSE-CAPILLARY: 157 mg/dL — AB (ref 65–99)
Glucose-Capillary: 235 mg/dL — ABNORMAL HIGH (ref 65–99)
Glucose-Capillary: 203 mg/dL — ABNORMAL HIGH (ref 65–99)
Glucose-Capillary: 177 mg/dL — ABNORMAL HIGH (ref 65–99)

## 2017-04-07 LAB — PH, BODY FLUID: pH, Body Fluid: 8.1

## 2017-04-07 MED ORDER — CARVEDILOL 3.125 MG PO TABS
3.1250 mg | ORAL_TABLET | Freq: Two times a day (BID) | ORAL | Status: DC
  Administered 2017-04-07 – 2017-04-08 (×2): 3.125 mg via ORAL
  Filled 2017-04-07 (×2): qty 1

## 2017-04-07 MED ORDER — MAGNESIUM SULFATE 4 GM/100ML IV SOLN
4.0000 g | Freq: Once | INTRAVENOUS | Status: AC
  Administered 2017-04-07: 4 g via INTRAVENOUS
  Filled 2017-04-07: qty 100

## 2017-04-07 MED ORDER — FUROSEMIDE 20 MG PO TABS
20.0000 mg | ORAL_TABLET | Freq: Every day | ORAL | Status: DC
  Administered 2017-04-08: 20 mg via ORAL
  Filled 2017-04-07: qty 1

## 2017-04-07 NOTE — Progress Notes (Signed)
[facility] NAME: Erin Hayes    MR#:  [id number]  DATE OF BIRTH:  [birth year]  SUBJECTIVE:  CHIEF COMPLAINT:   Chief Complaint  Patient presents with  . Foot Swelling   - Improving. Remains on room air. On IV Lasix. - soft blood pressures and so other blood pressure medications are being held  REVIEW OF SYSTEMS:  Review of Systems  Constitutional: Negative for chills, fever and malaise/fatigue.  HENT: Negative for congestion, ear discharge, hearing loss and nosebleeds.   Eyes: Negative for blurred vision and double vision.  Respiratory: Positive for shortness of breath. Negative for cough and wheezing.   Cardiovascular: Negative for chest pain and palpitations.  Gastrointestinal: Negative for abdominal pain, constipation, diarrhea, nausea and vomiting.  Genitourinary: Negative for dysuria.  Neurological: Negative for dizziness, speech change, focal weakness, seizures and headaches.  Psychiatric/Behavioral: Negative for depression.    DRUG ALLERGIES:  No Known Allergies  VITALS:  Blood pressure 99/73, pulse 88, temperature 98.4 F (36.9 C), temperature source Oral, resp. rate 17, height 4\' 11"  (1.499 m), weight 42.2 kg (93 lb 1.6 oz), SpO2 98 %.  PHYSICAL EXAMINATION:  Physical Exam  GENERAL:  51 y.o.-year-old patient lying in the bed with no acute distress.  EYES: Pupils equal, round, reactive to light and accommodation. No scleral icterus. Extraocular muscles intact.  HEENT: Head atraumatic, normocephalic. Oropharynx and nasopharynx clear.  NECK:  Supple, no jugular venous distention. No thyroid enlargement, no tenderness.  LUNGS: Normal breath sounds bilaterally, no wheezing, rales,rhonchi or crepitation. No use of accessory muscles of respiration.  CARDIOVASCULAR: S1, S2 normal. No rubs, or gallops. 2/6 systolic murmur noted. ABDOMEN: Soft, nontender, nondistended. Bowel sounds present. No organomegaly or mass.    EXTREMITIES: No  cyanosis, or clubbing. no pedal edema noted. NEUROLOGIC: Cranial nerves II through XII are intact. Muscle strength 5/5 in all extremities. Sensation intact. Gait not checked.  PSYCHIATRIC: The patient is alert and oriented x 3.  SKIN: No obvious rash, lesion, or ulcer.    LABORATORY PANEL:   CBC Recent Labs  Lab 04/04/17 0453  WBC 7.6  HGB 10.3*  HCT 32.0*  PLT 339   ------------------------------------------------------------------------------------------------------------------  Chemistries  Recent Labs  Lab 04/03/17 1527  04/07/17 0521  NA 138   < > 135  K 3.5   < > 4.8  CL 104   < > 97*  CO2 24   < > 30  GLUCOSE 118*   < > 154*  BUN 14   < > 20  CREATININE 0.64   < > 0.64  CALCIUM 8.6*   < > 8.3*  MG  --    < > 1.4*  AST 15  --   --   ALT 14  --   --   ALKPHOS 98  --   --   BILITOT 0.6  --   --    < > = values in this interval not displayed.   ------------------------------------------------------------------------------------------------------------------  Cardiac Enzymes Recent Labs  Lab 04/04/17 1045  TROPONINI 0.04*   ------------------------------------------------------------------------------------------------------------------  RADIOLOGY:  No results found.  EKG:   Orders placed or performed during the hospital encounter of 04/03/17  . EKG 12-Lead  . EKG 12-Lead  . EKG 12-Lead  . EKG 12-Lead    ASSESSMENT AND PLAN:   51 year old female with past medical history significant for hypertension, hyperlipidemia, diabetes mellitus, GERD presented with worsening shortness of breath and also lower extremity edema  #  1 acute systolic congestive heart failure exacerbation-echocardiogram this admission showed EF of 20-25% with global hypokinesis -Appreciate cardiology consult. -It is not ischemic cardiomyopathy. Patient had cardiac catheterization on 04/05/2017 showing nonobstructive CAD.. -Soft blood pressures, lasix dose was  increased to 60mg  IV BID per cardiology - on coreg and entresto started today and on aldactone- held again due to hypotension - likely entresto can be started as outpatient -Continue IV Lasix for another 1-2 days according to cardiology.  #2 hypokalemia and hypomagnesemia-replaced  #3 DM- sugars well controlled on SSI, a1c of 6.8 Continue to hold her Actos due to congestive heart failure  #4 DVT prophylaxis- lovenox  Encourage ambulation   All the records are reviewed and case discussed with Care Management/Social Workerr. Management plans discussed with the patient, family and they are in agreement.  CODE STATUS: Full Code  TOTAL TIME TAKING CARE OF THIS PATIENT: 36 minutes.   POSSIBLE D/C TOMORROW, DEPENDING ON CLINICAL CONDITION.   Gladstone Lighter M.D on 04/07/2017 at 8:51 AM  Between 7am to 6pm - Pager - [phone]  After 6pm go to [email or web address] - password [email or web address] [facility]  Office  [phone]  CC: Primary care physician; Patient, No Pcp Per

## 2017-04-07 NOTE — Consult Note (Signed)
[facility] NOTE   Pharmacy Consult for electrolytes Indication: hypokalemia due to diuresis  No Known Allergies  Patient Measurements: Height: 4\' 11"  (149.9 cm) Weight: 93 lb 1.6 oz (42.2 kg) IBW/kg (Calculated) : 43.2 Adjusted Body Weight:   Vital Signs: Temp: 98.4 F (36.9 C) (12/02 0455) Temp Source: Oral (12/02 0455) BP: 111/91 (12/02 0455) Pulse Rate: 88 (12/02 0455) Intake/Output from previous day: 12/01 0701 - 12/02 0700 In: 840 [P.O.:840] Out: 500 [Urine:500] Intake/Output from this shift: No intake/output data recorded.  Labs: Recent Labs    04/04/17 1045 04/05/17 0408 04/06/17 0549 04/07/17 0521  CREATININE  --  0.68 0.67 0.64  MG 1.4* 1.8  --  1.4*   Estimated Creatinine Clearance: 54.2 mL/min (by C-G formula based on SCr of 0.64 mg/dL).   Medical History: Past Medical History:  Diagnosis Date  . CHF (congestive heart failure) ([place]) 04/03/2017   a. type unknown; b. echo pending  . Diabetes ([place])   . GERD (gastroesophageal reflux disease)   . HLD (hyperlipidemia)   . Hypertension   . Pneumonia 02/19/2017    Medications:  Scheduled:  . aspirin EC  81 mg Oral Daily  . carvedilol  6.25 mg Oral BID WC  . enoxaparin (LOVENOX) injection  30 mg Subcutaneous Q24H  . furosemide  60 mg Intravenous Q12H  . insulin aspart  0-5 Units Subcutaneous QHS  . insulin aspart  0-9 Units Subcutaneous TID WC  . potassium chloride  20 mEq Oral BID  . sacubitril-valsartan  1 tablet Oral BID  . sodium chloride flush  3 mL Intravenous Q12H  . spironolactone  12.5 mg Oral Daily    Assessment: Pt is a 51 year old female who presents with what appears to be new onset HF. Pt is being diuresis with IV lasix 40mg  BID. Pharmacy was consulted to manage electrolytes.  11/29: KCl 3.1. KCL 40 MEQ BID ordered by cardiology. Awaiting results of Mg add on.   11/30 K =3.2, received 107mEq on 11/29 and currently ordered KCL 15mEq PO BID. Patient is being  transitioned from IV furosemide to PO.  12/1: Pt back on IV furosemide 40 mg q12h. K 3.4  12/2: Pt currently on IV furosemide 60 mg q 12-increased yesterday K=4.8 this AM. Mg=1.4  Goal of Therapy:  K 4-5 Mg 2-2.4  Plan:  Mg 4g IV once Continue KCL 20MEQ BID Follow up labs in AM  Loribeth Katich D Dailan Pfalzgraf, Pharm.D, BCPS Clinical Pharmacist  04/07/2017 7:16 AM

## 2017-04-07 NOTE — Progress Notes (Signed)
Patient ID: Erin Hayes, female   DOB: Nov 03, 1965, 51 y.o.   MRN: [id number]   Progress Note  Patient Name: Erin Hayes Date of Encounter: 04/07/2017  Primary Cardiologist: Johnny Bridge, MD   Subjective   I/Os still don't look accurate. She does report that her breathing feels back to normal.    LHC: Nonobstructive CAD.  Echo: EF 20-25%  Inpatient Medications    Scheduled Meds: . aspirin EC  81 mg Oral Daily  . carvedilol  3.125 mg Oral BID WC  . enoxaparin (LOVENOX) injection  30 mg Subcutaneous Q24H  . [START ON 04/08/2017] furosemide  20 mg Oral Daily  . insulin aspart  0-5 Units Subcutaneous QHS  . insulin aspart  0-9 Units Subcutaneous TID WC  . potassium chloride  20 mEq Oral BID  . sacubitril-valsartan  1 tablet Oral BID  . sodium chloride flush  3 mL Intravenous Q12H  . spironolactone  12.5 mg Oral Daily   Continuous Infusions: . sodium chloride     PRN Meds: sodium chloride, acetaminophen **OR** acetaminophen, albuterol, loperamide, ondansetron **OR** ondansetron (ZOFRAN) IV, sodium chloride flush   Vital Signs    Vitals:   04/06/17 2007 04/07/17 0455 04/07/17 0839 04/07/17 0947  BP: 96/73 (!) 111/91 99/73 (!) 119/92  Pulse: 90 88  98  Resp: 17 17  20   Temp: 99.1 F (37.3 C) 98.4 F (36.9 C)  98.6 F (37 C)  TempSrc: Oral Oral  Oral  SpO2: 100% 98%  96%  Weight:  93 lb 1.6 oz (42.2 kg)    Height:        Intake/Output Summary (Last 24 hours) at 04/07/2017 1225 Last data filed at 04/07/2017 1049 Gross per 24 hour  Intake 1198 ml  Output 1400 ml  Net -202 ml   Filed Weights   04/05/17 0500 04/06/17 0652 04/07/17 0455  Weight: 100 lb 4.8 oz (45.5 kg) 94 lb 14.4 oz (43 kg) 93 lb 1.6 oz (42.2 kg)    Physical Exam   General: NAD Neck: No JVD, no thyromegaly or thyroid nodule.  Lungs: Clear to auscultation bilaterally with normal respiratory effort. CV: Nondisplaced PMI.  Heart regular S1/S2, no S3/S4, no murmur.  No peripheral edema.  No carotid  bruit.  Normal pedal pulses.  Abdomen: Soft, nontender, no hepatosplenomegaly, no distention.  Skin: Intact without lesions or rashes.  Neurologic: Alert and oriented x 3.  Psych: Normal affect. Extremities: No clubbing or cyanosis.  HEENT: Normal.    Labs    Chemistry Recent Labs  Lab 04/03/17 1527  04/05/17 0408 04/06/17 0549 04/07/17 0521  NA 138   < > 138 138 135  K 3.5   < > 3.2* 3.4* 4.8  CL 104   < > 100* 98* 97*  CO2 24   < > 29 30 30   GLUCOSE 118*   < > 126* 144* 154*  BUN 14   < > 18 22* 20  CREATININE 0.64   < > 0.68 0.67 0.64  CALCIUM 8.6*   < > 7.9* 8.1* 8.3*  PROT 6.6  --   --   --   --   ALBUMIN 3.2*  --   --   --   --   AST 15  --   --   --   --   ALT 14  --   --   --   --   ALKPHOS 98  --   --   --   --  BILITOT 0.6  --   --   --   --   GFRNONAA >60   < > >60 >60 >60  GFRAA >60   < > >60 >60 >60  ANIONGAP 10   < > 9 10 8    < > = values in this interval not displayed.     Hematology Recent Labs  Lab 04/03/17 1527 04/04/17 0453  WBC 10.2 7.6  RBC 4.33 3.95  HGB 11.6* 10.3*  HCT 35.5 32.0*  MCV 81.8 81.1  MCH 26.7 26.2  MCHC 32.6 32.2  RDW 13.3 13.1  PLT 366 339    Cardiac Enzymes Recent Labs  Lab 04/03/17 1534 04/03/17 2309 04/04/17 0453 04/04/17 1045  TROPONINI <0.03 <0.03 0.03* 0.04*      BNP Recent Labs  Lab 04/03/17 1534  BNP 1,287.0*      Radiology    No results found.  Telemetry    RSR - Personally Reviewed  Cardiac Studies   2D Echocardiogram 11.29.2018  Study Conclusions   - Left ventricle: The cavity size was normal. There was moderate   concentric hypertrophy. Systolic function was severely reduced.   The estimated ejection fraction was in the range of 20% to 25%.   Diffuse hypokinesis. Regional wall motion abnormalities cannot be   excluded. Features are consistent with a pseudonormal left   ventricular filling pattern, with concomitant abnormal relaxation   and increased filling pressure (grade 2  diastolic dysfunction). - Mitral valve: There was mild to moderate regurgitation. - Right ventricle: Systolic function was normal. - Pericardium, extracardiac: A small pericardial effusion was   identified, circumferential.   Impressions:   - Left pleural effusion noted. Unable to exclude mass in the   pleural space measuring 7.5 x 4.5 cm.   Patient Profile     50 y.o. female with a h/o poorly controlled DM, HTN, HL, chronic diarreha, GERD, and recent PNA who was admitted 11/28 with CHF and found to have mild trop elevation and LV dysfxn w/ an EF of 20-25%, Gr2 DD.  Assessment & Plan    1.  Acute systolic CHF: Admitted with progressive lower ext edema with mild edema on CXR and BNP of 1287.  Echo showed EF of 20-25% w/ Gr2 DD, mod MR, and small circumferential pericardial effusion.  LHC showed no significant CAD => nonischemic cardiomyopathy.  She looks near-euvolemic today.  - Continue current Entresto and spironolactone.  - Decrease Coreg to 3.125 mg bid with soft BP.  - Transition to po diuretics.  As she does not appear to have been on a diuretic at home and will be on Entresto, will only use Lasix 20 mg daily (start tomorrow, she got IV Lasix this morning).  2.  L Pl effusion:  Initially noted on echo.  F/u CT w/ small layering effusion on L - ? PNA.  Now s/p thoracentesis.  Breathing stable and lungs clear this AM.   3.  Hypokalemia:  Supplementation ordered. 4.  DM II:  Per IM. 5.  Normocytic anemia:  ? Etiology. Stable.  Signed, Loralie Champagne, MD  04/07/2017, 12:25 PM    For questions or updates, please contact   Please consult [email or web address] for contact info under Cardiology/STEMI.

## 2017-04-07 NOTE — Progress Notes (Signed)
Pt has no complaints today. Family visit her this afternoon. Pt is resting at this time.

## 2017-04-07 NOTE — Progress Notes (Signed)
Pt BP was at 99/73. Dr. Tressia Miners was notified and states to hold Aldactone and Coreg.  Will continue to monitor

## 2017-04-08 ENCOUNTER — Encounter: Payer: Self-pay | Admitting: Cardiovascular Disease

## 2017-04-08 ENCOUNTER — Telehealth: Payer: Self-pay | Admitting: Cardiovascular Disease

## 2017-04-08 LAB — BASIC METABOLIC PANEL
ANION GAP: 7 (ref 5–15)
BUN: 19 mg/dL (ref 6–20)
CO2: 28 mmol/L (ref 22–32)
Calcium: 8.3 mg/dL — ABNORMAL LOW (ref 8.9–10.3)
Chloride: 98 mmol/L — ABNORMAL LOW (ref 101–111)
Creatinine, Ser: 0.63 mg/dL (ref 0.44–1.00)
GFR calc Af Amer: >60 mL/min (ref >60)
GFR calc non Af Amer: >60 mL/min (ref >60)
Glucose, Bld: 135 mg/dL — ABNORMAL HIGH (ref 65–99)
POTASSIUM: 5.1 mmol/L (ref 3.5–5.1)
Sodium: 133 mmol/L — ABNORMAL LOW (ref 135–145)

## 2017-04-08 LAB — GLUCOSE, CAPILLARY
GLUCOSE-CAPILLARY: 218 mg/dL — AB (ref 65–99)
Glucose-Capillary: 142 mg/dL — ABNORMAL HIGH (ref 65–99)

## 2017-04-08 LAB — MAGNESIUM: Magnesium: 2.3 mg/dL (ref 1.7–2.4)

## 2017-04-08 MED ORDER — SACUBITRIL-VALSARTAN 24-26 MG PO TABS: 1.0000 | ORAL_TABLET | Freq: Two times a day (BID) | ORAL | Status: DC

## 2017-04-08 MED ORDER — LOSARTAN POTASSIUM 25 MG PO TABS: 25.0000 mg | ORAL_TABLET | Freq: Every day | ORAL | 2 refills | Status: DC

## 2017-04-08 MED ORDER — CARVEDILOL 3.125 MG PO TABS: 3.1250 mg | ORAL_TABLET | Freq: Two times a day (BID) | ORAL | 2 refills | Status: DC

## 2017-04-08 MED ORDER — SPIRONOLACTONE 25 MG PO TABS: 12.5000 mg | ORAL_TABLET | Freq: Every day | ORAL | 2 refills | Status: DC

## 2017-04-08 MED ORDER — FUROSEMIDE 20 MG PO TABS: 20.0000 mg | ORAL_TABLET | Freq: Every day | ORAL | 2 refills | Status: DC

## 2017-04-08 NOTE — Progress Notes (Signed)
Patient ID: Erin Hayes, female   DOB: 1965/09/05, 51 y.o.   MRN: [id number]   Progress Note  Patient Name: Erin Hayes Date of Encounter: 04/08/2017  Primary Cardiologist: Johnny Bridge, MD   Subjective   The patient feels significantly better and reports that shortness of breath is back to baseline.  No chest pain.  Blood pressure continues to be soft.  LHC: Nonobstructive CAD.  Echo: EF 20-25%  Inpatient Medications    Scheduled Meds: . aspirin EC  81 mg Oral Daily  . carvedilol  3.125 mg Oral BID WC  . enoxaparin (LOVENOX) injection  30 mg Subcutaneous Q24H  . furosemide  20 mg Oral Daily  . insulin aspart  0-5 Units Subcutaneous QHS  . insulin aspart  0-9 Units Subcutaneous TID WC  . [START ON 04/09/2017] sacubitril-valsartan  1 tablet Oral BID  . sodium chloride flush  3 mL Intravenous Q12H  . spironolactone  12.5 mg Oral Daily   Continuous Infusions: . sodium chloride     PRN Meds: sodium chloride, acetaminophen **OR** acetaminophen, albuterol, loperamide, ondansetron **OR** ondansetron (ZOFRAN) IV, sodium chloride flush   Vital Signs    Vitals:   04/07/17 1732 04/07/17 1932 04/08/17 0413 04/08/17 0741  BP: 124/90 95/76 102/81 109/84  Pulse: 99 91 93 92  Resp: 18 18 18 14   Temp: (!) 97.5 F (36.4 C) 98.6 F (37 C) 98.1 F (36.7 C) 98.4 F (36.9 C)  TempSrc:  Oral Oral   SpO2: 98% 100% 99% 100%  Weight:   92 lb 11.2 oz (42 kg)   Height:        Intake/Output Summary (Last 24 hours) at 04/08/2017 1121 Last data filed at 04/08/2017 0941 Gross per 24 hour  Intake 717 ml  Output 600 ml  Net 117 ml   Filed Weights   04/06/17 0652 04/07/17 0455 04/08/17 0413  Weight: 94 lb 14.4 oz (43 kg) 93 lb 1.6 oz (42.2 kg) 92 lb 11.2 oz (42 kg)    Physical Exam   General: NAD Neck: No JVD, no thyromegaly or thyroid nodule.  Lungs: Clear to auscultation bilaterally with decreased breath sounds at the left base. CV: Nondisplaced PMI.  Heart regular S1/S2, no S3/S4,  no murmur.  No peripheral edema.  No carotid bruit.  Normal pedal pulses.  Abdomen: Soft, nontender, no hepatosplenomegaly, no distention.  Skin: Intact without lesions or rashes.  Neurologic: Alert and oriented x 3.  Psych: Normal affect. Extremities: No clubbing or cyanosis.  HEENT: Normal.    Labs    Chemistry Recent Labs  Lab 04/03/17 1527  04/06/17 0549 04/07/17 0521 04/08/17 0512  NA 138   < > 138 135 133*  K 3.5   < > 3.4* 4.8 5.1  CL 104   < > 98* 97* 98*  CO2 24   < > 30 30 28   GLUCOSE 118*   < > 144* 154* 135*  BUN 14   < > 22* 20 19  CREATININE 0.64   < > 0.67 0.64 0.63  CALCIUM 8.6*   < > 8.1* 8.3* 8.3*  PROT 6.6  --   --   --   --   ALBUMIN 3.2*  --   --   --   --   AST 15  --   --   --   --   ALT 14  --   --   --   --   ALKPHOS 98  --   --   --   --  BILITOT 0.6  --   --   --   --   GFRNONAA >60   < > >60 >60 >60  GFRAA >60   < > >60 >60 >60  ANIONGAP 10   < > 10 8 7    < > = values in this interval not displayed.     Hematology Recent Labs  Lab 04/03/17 1527 04/04/17 0453  WBC 10.2 7.6  RBC 4.33 3.95  HGB 11.6* 10.3*  HCT 35.5 32.0*  MCV 81.8 81.1  MCH 26.7 26.2  MCHC 32.6 32.2  RDW 13.3 13.1  PLT 366 339    Cardiac Enzymes Recent Labs  Lab 04/03/17 1534 04/03/17 2309 04/04/17 0453 04/04/17 1045  TROPONINI <0.03 <0.03 0.03* 0.04*      BNP Recent Labs  Lab 04/03/17 1534  BNP 1,287.0*      Radiology    No results found.  Telemetry    RSR - Personally Reviewed  Cardiac Studies   2D Echocardiogram 11.29.2018  Study Conclusions   - Left ventricle: The cavity size was normal. There was moderate   concentric hypertrophy. Systolic function was severely reduced.   The estimated ejection fraction was in the range of 20% to 25%.   Diffuse hypokinesis. Regional wall motion abnormalities cannot be   excluded. Features are consistent with a pseudonormal left   ventricular filling pattern, with concomitant abnormal  relaxation   and increased filling pressure (grade 2 diastolic dysfunction). - Mitral valve: There was mild to moderate regurgitation. - Right ventricle: Systolic function was normal. - Pericardium, extracardiac: A small pericardial effusion was   identified, circumferential.   Impressions:   - Left pleural effusion noted. Unable to exclude mass in the   pleural space measuring 7.5 x 4.5 cm.   Patient Profile     51 y.o. female with a h/o poorly controlled DM, HTN, HL, chronic diarreha, GERD, and recent PNA who was admitted 11/28 with CHF and found to have mild trop elevation and LV dysfxn w/ an EF of 20-25%, Gr2 DD.  Assessment & Plan    1.  Acute systolic CHF: Due to nonischemic cardiomyopathy.  She appears to be euvolemic now. I agree with switching to oral furosemide. Blood pressure has been soft and thus the best option might be to switch Entresto to losartan 25 mg once daily, continue small dose carvedilol and spironolactone.  2.  L Pl effusion:  Initially noted on echo.  F/u CT w/ small layering effusion on L - ? PNA.  Now s/p thoracentesis.  Breathing stable . 3.  DM II:  Per IM. 4.  Normocytic anemia:  ? Etiology. Stable.  The patient is being discharged home today.  Will arrange for follow-up in heart failure clinic within 1 week. I discussed with Dr. Tressia Miners  Signed, Erin Sacramento, MD  04/08/2017, 11:21 AM    For questions or updates, please contact   Please consult [email or web address] for contact info under Cardiology/STEMI.

## 2017-04-08 NOTE — Plan of Care (Signed)
Pt voices intent to comply with salt and fluid restrictions and to weigh daily once scale obtained.

## 2017-04-08 NOTE — Consult Note (Signed)
Provided patient with "Living Better with Heart Failure" packet. Briefly reviewed definition of heart failure and signs and symptoms of an exacerbation. Reviewed importance of and reason behind checking weight daily in the AM, after using the bathroom, but before getting dressed. Discussed when to call the Dr= weight gain of >2lb overnight of 5lb in a week,  Discussed yellow zone= call MD: weight gain of >2lb overnight of 5lb in a week, increased swelling, increased SOB when lying down, chest discomfort, dizziness, increased fatigue Red Zone= call 911: struggle to breath, fainting or near fainting, significant chest pain Reviewed low sodium diet <2g/day-provided handout of recommended and not recommended foods  Fluid restriction <2L/day Reviewed how to read nutrition label Reviewed medication changes: stop lisinopril, start losartan. Start spironolactone, carvedilol, furesomide Explained briefly why pt is on the medications (either make you feel better, live longer or keep you out of the hospital) and discussed monitoring and side effects  Pt was given scales by case management. RN last night talked to pt about importance of weighting daily and low sodium diet. There education points were reinforced.  Ramond Dial, Pharm.D, BCPS Clinical Pharmacist

## 2017-04-08 NOTE — Progress Notes (Signed)
Discharge instructions explained to pt/ verbalized an understanding/ iv and tele removed/ care mag provided pt with scale to take home/  Transported off unit via wheelchair.

## 2017-04-08 NOTE — Telephone Encounter (Signed)
TCM....  Patient is being discharged   They saw Dr. Rockey Situ   They are scheduled to see  Sharolyn Douglas  on 05/06/17 at 8:30 am  They were seen for shortness of breath, lower extremity edema, CHF  They need to be seen within 2 wks  Pt added wait list

## 2017-04-08 NOTE — Discharge Summary (Signed)
[facility] [place] NAME: Erin Hayes    MR#:  [id number]  DATE OF BIRTH:  10/19/1965  DATE OF ADMISSION:  04/03/2017   ADMITTING PHYSICIAN: Henreitta Leber, MD  DATE OF DISCHARGE: 04/08/17  PRIMARY CARE PHYSICIAN: Patient, No Pcp Per   ADMISSION DIAGNOSIS:   Diabetes mellitus without complication (HCC) [P10.3] Acute congestive heart failure, unspecified heart failure type ([place]) [I50.9]  DISCHARGE DIAGNOSIS:   Active Problems:   CHF (congestive heart failure) ([place])   SECONDARY DIAGNOSIS:   Past Medical History:  Diagnosis Date  . CHF (congestive heart failure) ([place]) 04/03/2017   a. type unknown; b. echo pending  . Diabetes ([place])   . GERD (gastroesophageal reflux disease)   . HLD (hyperlipidemia)   . Hypertension   . Pneumonia 02/19/2017    HOSPITAL COURSE:   51 year old female with past medical history significant for hypertension, hyperlipidemia, diabetes mellitus, GERD presented with worsening shortness of breath and also lower extremity edema  #1 acute systolic congestive heart failure exacerbation-echocardiogram this admission showed EF of 20-25% with global hypokinesis -Appreciate cardiology consult. -It is non-ischemic cardiomyopathy. Patient had cardiac catheterization on 04/05/2017 showing nonobstructive CAD.. -Soft blood pressures,  diuresed with IV Lasix for several days now. Euvolemic at this point. -Changed to oral Lasix, also on Aldactone, Coreg and losartan. Patient does not have insurance for entresto - likely entresto can be started as outpatient  #2 hypokalemia and hypomagnesemia-replaced appropriately, potassium at 5.1 today. Additional supplements discontinued. Since patient will be on losartan and Aldactone with low-dose Lasix-just monitor as outpatient  #3 DM- sugars well controlled on SSI, a1c of 6.8 Discontinued her Actos due to congestive heart failure - Diet-controlled  recommended.  Ambulated in the room. Doing well for discharge home today    DISCHARGE CONDITIONS:   Guarded  CONSULTS OBTAINED:   Treatment Team:  Minna Merritts, MD  DRUG ALLERGIES:   No Known Allergies DISCHARGE MEDICATIONS:   Allergies as of 04/08/2017   No Known Allergies     Medication List    STOP taking these medications   amLODipine 5 MG tablet Commonly known as:  NORVASC   lisinopril 10 MG tablet Commonly known as:  PRINIVIL,ZESTRIL   metoprolol tartrate 25 MG tablet Commonly known as:  LOPRESSOR     TAKE these medications   albuterol 108 (90 Base) MCG/ACT inhaler Commonly known as:  PROVENTIL HFA;VENTOLIN HFA Inhale 2 puffs into the lungs every 4 (four) hours as needed for wheezing or shortness of breath.   aspirin 81 MG tablet Take 81 mg by mouth daily.   BAYER CONTOUR TEST test strip Generic drug:  glucose blood by Other route Two (2) times a day.   carvedilol 3.125 MG tablet Commonly known as:  COREG Take 1 tablet (3.125 mg total) by mouth 2 (two) times daily with a meal.   FIFTY50 GLUCOSE METER 2.0 w/Device Kit Frequency:PHARMDIR   Dosage:0.0     Instructions:  Note:use as directed by [facility] (up to four times daily) Dose: 1 LANCET   furosemide 20 MG tablet Commonly known as:  LASIX Take 1 tablet (20 mg total) by mouth daily. Start taking on:  04/09/2017   loperamide 2 MG capsule Commonly known as:  IMODIUM Take 2 capsules (4 mg total) by mouth as needed for diarrhea or loose stools.   losartan 25 MG tablet Commonly known as:  COZAAR Take 1 tablet (25 mg total) by mouth daily.  spironolactone 25 MG tablet Commonly known as:  ALDACTONE Take 0.5 tablets (12.5 mg total) by mouth daily. Start taking on:  04/09/2017        DISCHARGE INSTRUCTIONS:   1. PCP follow-up in 1-2 weeks 2. Cardiology follow-up in 2-3 weeks  DIET:   Cardiac diet and Diabetic diet  ACTIVITY:   Activity as tolerated  OXYGEN:   Home  Oxygen: No.  Oxygen Delivery: room air  DISCHARGE LOCATION:   home   If you experience worsening of your admission symptoms, develop shortness of breath, life threatening emergency, suicidal or homicidal thoughts you must seek medical attention immediately by calling 911 or calling your MD immediately  if symptoms less severe.  You Must read complete instructions/literature along with all the possible adverse reactions/side effects for all the Medicines you take and that have been prescribed to you. Take any new Medicines after you have completely understood and accpet all the possible adverse reactions/side effects.   Please note  You were cared for by a hospitalist during your hospital stay. If you have any questions about your discharge medications or the care you received while you were in the hospital after you are discharged, you can call the unit and asked to speak with the hospitalist on call if the hospitalist that took care of you is not available. Once you are discharged, your primary care physician will handle any further medical issues. Please note that NO REFILLS for any discharge medications will be authorized once you are discharged, as it is imperative that you return to your primary care physician (or establish a relationship with a primary care physician if you do not have one) for your aftercare needs so that they can reassess your need for medications and monitor your lab values.    On the day of Discharge:  VITAL SIGNS:   Blood pressure 109/84, pulse 92, temperature 98.4 F (36.9 C), resp. rate 14, height '4\' 11"'$  (1.499 m), weight 42 kg (92 lb 11.2 oz), SpO2 100 %.  PHYSICAL EXAMINATION:    GENERAL:  51 y.o.-year-old patient lying in the bed with no acute distress.  EYES: Pupils equal, round, reactive to light and accommodation. No scleral icterus. Extraocular muscles intact.  HEENT: Head atraumatic, normocephalic. Oropharynx and nasopharynx clear.  NECK:  Supple, no  jugular venous distention. No thyroid enlargement, no tenderness.  LUNGS: Normal breath sounds bilaterally, no wheezing, rales,rhonchi or crepitation. No use of accessory muscles of respiration.  CARDIOVASCULAR: S1, S2 normal. No rubs, or gallops. 2/6 systolic murmur noted. ABDOMEN: Soft, nontender, nondistended. Bowel sounds present. No organomegaly or mass.  EXTREMITIES: No  cyanosis, or clubbing. no pedal edema noted. NEUROLOGIC: Cranial nerves II through XII are intact. Muscle strength 5/5 in all extremities. Sensation intact. Gait not checked.  PSYCHIATRIC: The patient is alert and oriented x 3.  SKIN: No obvious rash, lesion, or ulcer.     DATA REVIEW:   CBC Recent Labs  Lab 04/04/17 0453  WBC 7.6  HGB 10.3*  HCT 32.0*  PLT 339    Chemistries  Recent Labs  Lab 04/03/17 1527  04/08/17 0512  NA 138   < > 133*  K 3.5   < > 5.1  CL 104   < > 98*  CO2 24   < > 28  GLUCOSE 118*   < > 135*  BUN 14   < > 19  CREATININE 0.64   < > 0.63  CALCIUM 8.6*   < > 8.3*  MG  --    < > 2.3  AST 15  --   --   ALT 14  --   --   ALKPHOS 98  --   --   BILITOT 0.6  --   --    < > = values in this interval not displayed.     Microbiology Results  Results for orders placed or performed during the hospital encounter of 02/18/17  Blood culture (routine x 2)     Status: None   Collection Time: 02/18/17 11:22 PM  Result Value Ref Range Status   Specimen Description BLOOD RT HAND  Final   Special Requests   Final    BOTTLES DRAWN AEROBIC AND ANAEROBIC Blood Culture adequate volume   Culture NO GROWTH 5 DAYS  Final   Report Status 02/23/2017 FINAL  Final  Blood culture (routine x 2)     Status: None   Collection Time: 02/18/17 11:22 PM  Result Value Ref Range Status   Specimen Description BLOOD LT HAND  Final   Special Requests   Final    BOTTLES DRAWN AEROBIC AND ANAEROBIC Blood Culture adequate volume   Culture NO GROWTH 5 DAYS  Final   Report Status 02/23/2017 FINAL  Final     RADIOLOGY:  No results found.   Management plans discussed with the patient, family and they are in agreement.  CODE STATUS:     Code Status Orders  (From admission, onward)        Start     Ordered   04/03/17 1955  Full code  Continuous     04/03/17 1955    Code Status History    Date Active Date Inactive Code Status Order ID Comments User Context   02/19/2017 02:03 02/20/2017 16:27 Full Code [id number]  Saundra Shelling, MD ED   10/31/2016 21:59 11/07/2016 17:41 Full Code [id number]  Lance Coon, MD Inpatient   04/26/2013 01:40 04/29/2013 21:57 Full Code [id number]  Guy Begin, MD Inpatient      TOTAL TIME TAKING CARE OF THIS PATIENT: 38 minutes.    Gladstone Lighter M.D on 04/08/2017 at 10:20 AM  Between 7am to 6pm - Pager - [phone]  After 6pm go to [email or web address] - Proofreader  [facility]  Office  [phone]  CC: Primary care physician; Patient, No Pcp Per   Note: This dictation was prepared with Dragon dictation along with smaller phrase technology. Any transcriptional errors that result from this process are unintentional.

## 2017-04-08 NOTE — Consult Note (Signed)
[facility] NOTE   Pharmacy Consult for electrolytes Indication: hypokalemia due to diuresis  No Known Allergies  Patient Measurements: Height: 4\' 11"  (149.9 cm) Weight: 92 lb 11.2 oz (42 kg) IBW/kg (Calculated) : 43.2 Adjusted Body Weight:   Vital Signs: Temp: 98.4 F (36.9 C) (12/03 0741) Temp Source: Oral (12/03 0413) BP: 109/84 (12/03 0741) Pulse Rate: 92 (12/03 0741) Intake/Output from previous day: 12/02 0701 - 12/03 0700 In: 1572 [P.O.:1195] Out: 1500 [Urine:1500] Intake/Output from this shift: Total I/O In: 240 [P.O.:240] Out: -   Labs: Recent Labs    04/06/17 0549 04/07/17 0521 04/08/17 0512  CREATININE 0.67 0.64 0.63  MG  --  1.4* 2.3   Estimated Creatinine Clearance: 53.9 mL/min (by C-G formula based on SCr of 0.63 mg/dL).   Medical History: Past Medical History:  Diagnosis Date  . CHF (congestive heart failure) ([place]) 04/03/2017   a. type unknown; b. echo pending  . Diabetes ([place])   . GERD (gastroesophageal reflux disease)   . HLD (hyperlipidemia)   . Hypertension   . Pneumonia 02/19/2017    Medications:  Scheduled:  . aspirin EC  81 mg Oral Daily  . carvedilol  3.125 mg Oral BID WC  . enoxaparin (LOVENOX) injection  30 mg Subcutaneous Q24H  . furosemide  20 mg Oral Daily  . insulin aspart  0-5 Units Subcutaneous QHS  . insulin aspart  0-9 Units Subcutaneous TID WC  . [START ON 04/09/2017] sacubitril-valsartan  1 tablet Oral BID  . sodium chloride flush  3 mL Intravenous Q12H  . spironolactone  12.5 mg Oral Daily    Assessment: Pt is a 51 year old female who presents with what appears to be new onset HF. Pt is being diuresis with IV lasix 40mg  BID. Pharmacy was consulted to manage electrolytes.  11/29: KCl 3.1. KCL 40 MEQ BID ordered by cardiology. Awaiting results of Mg add on.   11/30 K =3.2, received 45mEq on 11/29 and currently ordered KCL 46mEq PO BID. Patient is being transitioned from IV furosemide to PO.  12/1:  Pt back on IV furosemide 40 mg q12h. K 3.4  12/2: Pt currently on IV furosemide 60 mg q 12-increased yesterday K=4.8 this AM. Mg=1.4  12/3: All electrolytes within normal limits.   Goal of Therapy:  K 4-5 Mg 2-2.4  Plan:  No supplementation warranted at this time.   Larene Beach, PharmD Clinical Pharmacist  04/08/2017 11:00 AM

## 2017-04-08 NOTE — Care Management (Signed)
Patient is currently being [facility].  She will not discharge on Entresto.  Provided scales.

## 2017-04-08 NOTE — Telephone Encounter (Signed)
Patient contacted regarding discharge from [facility] on 04/08/17.  Patient understands to follow up with provider Ignacia Bayley NP on 05/06/17 at 08:30 AM at [facility]. Patient understands discharge instructions? Yes Patient understands medications and regiment? Yes Patient understands to bring all medications to this visit? Yes  Also confirmed appointment for 04/15/17 at 2:40 PM with Darylene Price NP in our CHF clinic. She confirmed all appointments and had no further questions at this time.

## 2017-04-10 ENCOUNTER — Ambulatory Visit: Payer: Self-pay | Admitting: Gastroenterology

## 2017-04-15 ENCOUNTER — Ambulatory Visit: Payer: Self-pay | Admitting: Family

## 2017-04-17 ENCOUNTER — Ambulatory Visit: Payer: Self-pay | Admitting: Gastroenterology

## 2017-04-23 ENCOUNTER — Ambulatory Visit: Payer: Self-pay | Admitting: Gastroenterology

## 2017-04-25 LAB — CYTOLOGY - NON PAP

## 2017-04-26 ENCOUNTER — Encounter: Payer: Self-pay | Admitting: Family

## 2017-04-26 ENCOUNTER — Ambulatory Visit: Payer: Medicaid Other | Attending: Family | Admitting: Family

## 2017-04-26 ENCOUNTER — Other Ambulatory Visit: Payer: Self-pay

## 2017-04-26 VITALS — BP 115/81 | HR 89 | Temp 98.4°F | Resp 20 | Ht 60.0 in | Wt 90.0 lb

## 2017-04-26 DIAGNOSIS — E089 Diabetes mellitus due to underlying condition without complications: Secondary | ICD-10-CM

## 2017-04-26 DIAGNOSIS — Z8701 Personal history of pneumonia (recurrent): Secondary | ICD-10-CM | POA: Insufficient documentation

## 2017-04-26 DIAGNOSIS — K219 Gastro-esophageal reflux disease without esophagitis: Secondary | ICD-10-CM | POA: Insufficient documentation

## 2017-04-26 DIAGNOSIS — E785 Hyperlipidemia, unspecified: Secondary | ICD-10-CM | POA: Insufficient documentation

## 2017-04-26 DIAGNOSIS — Z79899 Other long term (current) drug therapy: Secondary | ICD-10-CM | POA: Insufficient documentation

## 2017-04-26 DIAGNOSIS — I2510 Atherosclerotic heart disease of native coronary artery without angina pectoris: Secondary | ICD-10-CM | POA: Insufficient documentation

## 2017-04-26 DIAGNOSIS — I5022 Chronic systolic (congestive) heart failure: Secondary | ICD-10-CM | POA: Insufficient documentation

## 2017-04-26 DIAGNOSIS — I110 Hypertensive heart disease with heart failure: Secondary | ICD-10-CM | POA: Insufficient documentation

## 2017-04-26 DIAGNOSIS — Z8249 Family history of ischemic heart disease and other diseases of the circulatory system: Secondary | ICD-10-CM | POA: Insufficient documentation

## 2017-04-26 DIAGNOSIS — Z801 Family history of malignant neoplasm of trachea, bronchus and lung: Secondary | ICD-10-CM | POA: Insufficient documentation

## 2017-04-26 DIAGNOSIS — I10 Essential (primary) hypertension: Secondary | ICD-10-CM

## 2017-04-26 DIAGNOSIS — Z7982 Long term (current) use of aspirin: Secondary | ICD-10-CM | POA: Insufficient documentation

## 2017-04-26 DIAGNOSIS — E119 Type 2 diabetes mellitus without complications: Secondary | ICD-10-CM | POA: Insufficient documentation

## 2017-04-26 LAB — GLUCOSE, CAPILLARY: GLUCOSE-CAPILLARY: 165 mg/dL — AB (ref 65–99)

## 2017-04-26 MED ORDER — ALBUTEROL SULFATE HFA 108 (90 BASE) MCG/ACT IN AERS: 2.0000 | INHALATION_SPRAY | RESPIRATORY_TRACT | 3 refills | Status: DC | PRN

## 2017-04-26 NOTE — Patient Instructions (Signed)
Continue weighing daily and call for an overnight weight gain of > 2 pounds or a weekly weight gain of >5 pounds. 

## 2017-04-26 NOTE — Progress Notes (Signed)
Patient ID: Erin Hayes, female    DOB: [birth year], 51 y.o.   MRN: [id number]  HPI  Erin Hayes is a 51 y/o female with a history of diabetes, GERD, hyperlipidemia, HTN, pneumonia and chronic heart failure.   Echo report from 04/04/17 reviewed and showed an EF of 20-25% along with a left pleural effusion. Cardiac catheterization done 04/05/17 and showed EF of 25% along with severely elevated LV end diastolic pressure along with mild nonobstructive CAD.  Admitted 04/03/17 due to acute HF. Cardiology consult obtained. Initially needed IV diuretics and transitioned to oral diuretics. Had echo and catheterization done during this admission. Discharged after 5 days. Admitted 02/18/17 due to LLL pneumonia. Initially needed IV antibiotics & then transitioned to oral antibiotics. Discharged after 2 days. Was in the ED 02/04/17 due to pneumonia where she was treated and released.   She presents today for her initial visit with a chief complaint of mild fatigue upon moderate exertion. She describes this as chronic over the last few months. She does feel like her energy level is steadily improving. She has associated cough, infrequent chest pain and light-headedness. She denies any shortness of breath, edema, palpitations, weight gain or difficulty sleeping.   Past Medical History:  Diagnosis Date  . CHF (congestive heart failure) ([place]) 04/03/2017   a. type unknown; b. echo pending  . Diabetes ([place])   . GERD (gastroesophageal reflux disease)   . HLD (hyperlipidemia)   . Hypertension   . Pneumonia 02/19/2017   Past Surgical History:  Procedure Laterality Date  . COLONOSCOPY WITH PROPOFOL N/A 01/21/2017   Procedure: COLONOSCOPY WITH PROPOFOL;  Surgeon: Jonathon Bellows, MD;  Location: [facility] ENDOSCOPY;  Service: Gastroenterology;  Laterality: N/A;  . ESOPHAGOGASTRODUODENOSCOPY (EGD) WITH PROPOFOL N/A 01/21/2017   Procedure: ESOPHAGOGASTRODUODENOSCOPY (EGD) WITH PROPOFOL;  Surgeon: Jonathon Bellows, MD;  Location:  [facility] ENDOSCOPY;  Service: Gastroenterology;  Laterality: N/A;  . FLEXIBLE SIGMOIDOSCOPY N/A 11/04/2016   Procedure: FLEXIBLE SIGMOIDOSCOPY;  Surgeon: Wilford Corner, MD;  Location: [facility] ENDOSCOPY;  Service: Endoscopy;  Laterality: N/A;  . LEFT HEART CATH AND CORONARY ANGIOGRAPHY N/A 04/05/2017   Procedure: LEFT HEART CATH AND CORONARY ANGIOGRAPHY;  Surgeon: Wellington Hampshire, MD;  Location: [place] CV LAB;  Service: Cardiovascular;  Laterality: N/A;  . LITHOTRIPSY     Family History  Problem Relation Age of Onset  . Lung cancer Mother   . Hypertension Father   . CAD Father        a. MI age 85  . COPD Neg Hx   . Diabetes Mellitus II Neg Hx    Social History   Tobacco Use  . Smoking status: Never Smoker  . Smokeless tobacco: Never Used  Substance Use Topics  . Alcohol use: No   No Known Allergies Prior to Admission medications   Medication Sig Start Date End Date Taking? Authorizing Provider  albuterol (PROVENTIL HFA;VENTOLIN HFA) 108 (90 Base) MCG/ACT inhaler Inhale 2 puffs into the lungs every 4 (four) hours as needed for wheezing or shortness of breath. 04/26/17  Yes Darylene Price A, FNP  aspirin 81 MG tablet Take 81 mg by mouth daily.   Yes [provider]  Blood Glucose Monitoring Suppl (FIFTY50 GLUCOSE METER 2.0) w/Device KIT Frequency:PHARMDIR   Dosage:0.0     Instructions:  Note:use as directed by [place] (up to four times daily) Dose: 1 LANCET 05/13/13  Yes [provider]  carvedilol (COREG) 3.125 MG tablet Take 1 tablet (3.125 mg total) by mouth  2 (two) times daily with a meal. 04/08/17  Yes Gladstone Lighter, MD  furosemide (LASIX) 20 MG tablet Take 1 tablet (20 mg total) by mouth daily. 04/09/17  Yes Gladstone Lighter, MD  glucose blood (BAYER CONTOUR TEST) test strip by Other route Two (2) times a day. 05/13/13  Yes [provider]  loperamide (IMODIUM) 2 MG capsule Take 2 capsules (4 mg total) by mouth as needed for diarrhea or  loose stools. 11/06/16  Yes Dustin Flock, MD  losartan (COZAAR) 25 MG tablet Take 1 tablet (25 mg total) by mouth daily. 04/08/17 04/08/18 Yes Gladstone Lighter, MD  spironolactone (ALDACTONE) 25 MG tablet Take 0.5 tablets (12.5 mg total) by mouth daily. 04/09/17  Yes Gladstone Lighter, MD   Review of Systems  Constitutional: Positive for fatigue (better). Negative for appetite change.  HENT: Positive for rhinorrhea. Negative for congestion and sore throat.   Eyes: Negative.   Respiratory: Positive for cough. Negative for chest tightness and shortness of breath.   Cardiovascular: Positive for chest pain (infrequent). Negative for palpitations and leg swelling.  Gastrointestinal: Negative for abdominal distention and abdominal pain.  Endocrine: Negative.   Genitourinary: Negative.   Musculoskeletal: Negative for back pain and neck pain.  Skin: Negative.   Allergic/Immunologic: Negative.   Neurological: Positive for light-headedness. Negative for dizziness.  Hematological: Negative for adenopathy. Does not bruise/bleed easily.  Psychiatric/Behavioral: Negative for dysphoric mood and sleep disturbance (sleeping on 3 pillows for comfort). The patient is not nervous/anxious.    Vitals:   04/26/17 1416  BP: 115/81  Pulse: 89  Resp: 20  Temp: 98.4 F (36.9 C)  TempSrc: Oral  Weight: 90 lb (40.8 kg)  Height: 5' (1.524 m)   Wt Readings from Last 3 Encounters:  04/26/17 90 lb (40.8 kg)  04/08/17 92 lb 11.2 oz (42 kg)  03/07/17 99 lb (44.9 kg)   Lab Results  Component Value Date   CREATININE 0.63 04/08/2017   CREATININE 0.64 04/07/2017   CREATININE 0.67 04/06/2017    Physical Exam  Constitutional: She is oriented to person, place, and time. She appears well-developed and well-nourished.  HENT:  Head: Normocephalic and atraumatic.  Neck: Normal range of motion. Neck supple. No JVD present.  Cardiovascular: Normal rate and regular rhythm.  Pulmonary/Chest: Effort normal. She has  no wheezes. She has no rales.  Abdominal: Soft. She exhibits no distension. There is no tenderness.  Musculoskeletal: She exhibits no edema or tenderness.  Neurological: She is alert and oriented to person, place, and time.  Skin: Skin is warm and dry.  Psychiatric: She has a normal mood and affect. Her behavior is normal. Thought content normal.  Nursing note and vitals reviewed.   Assessment & Plan:  1: Chronic heart failure with reduced ejection fraction- - NYHA class II - euvolemic today - weighing daily. Instructed to call for an overnight weight gain of >2 pounds or a weekly weight gain of >5 pounds - not adding salt to her food and reviewed how to read labels. Encouraged her to stay under '2000mg'$  sodium daily and written dietary information was given to her about this - discussed possibly changing her losartan to entresto at her next visit. May need to decrease furosemide at next visit when entresto gets started to help her BP stay normal  2: HTN- - BP looks good today - BMP from 04/08/17 reviewed and shows sodium 133, potassium 5.1 and GFR >60 - will recheck a BMP at her next visit to monitor potassium level  especially if entresto gets started or furosemide gets stopped. She says that she's had a low potassium level in the past - goes to [facility] for her primary care   3: Diabetes- - nonfasting glucose in clinic today was 165 - currently not on any medications for this  Medication bottles were reviewed.  Return in 2 weeks or sooner for any questions/problems before then.

## 2017-05-06 ENCOUNTER — Ambulatory Visit (INDEPENDENT_AMBULATORY_CARE_PROVIDER_SITE_OTHER): Payer: Medicaid Other | Admitting: Nurse Practitioner

## 2017-05-06 ENCOUNTER — Encounter: Payer: Self-pay | Admitting: Nurse Practitioner

## 2017-05-06 VITALS — BP 124/90 | HR 85 | Ht 59.0 in | Wt 98.5 lb

## 2017-05-06 DIAGNOSIS — I10 Essential (primary) hypertension: Secondary | ICD-10-CM

## 2017-05-06 DIAGNOSIS — I428 Other cardiomyopathies: Secondary | ICD-10-CM

## 2017-05-06 DIAGNOSIS — I5042 Chronic combined systolic (congestive) and diastolic (congestive) heart failure: Secondary | ICD-10-CM

## 2017-05-06 MED ORDER — SACUBITRIL-VALSARTAN 24-26 MG PO TABS: 1.0000 | ORAL_TABLET | Freq: Two times a day (BID) | ORAL | 0 refills | Status: DC

## 2017-05-06 MED ORDER — SACUBITRIL-VALSARTAN 24-26 MG PO TABS: 1.0000 | ORAL_TABLET | Freq: Two times a day (BID) | ORAL | 6 refills | Status: DC

## 2017-05-06 NOTE — Progress Notes (Signed)
Office Visit    Patient Name: Erin Hayes Date of Encounter: 05/06/2017  Primary Care Provider:  System, Pcp Not In Primary Cardiologist:  Ida Rogue, MD  Chief Complaint    51 year old female with a history of diabetes, hypertension, hyperlipidemia, chronic diarrhea, GERD, recent pneumonia, and chronic combined systolic and diastolic congestive heart failure/nonischemic cardiomyopathy, who presents for follow-up.  Past Medical History    Past Medical History:  Diagnosis Date  . Chronic combined systolic (congestive) and diastolic (congestive) heart failure ([place])    a. 03/2017 Echo: EF 20-25%, diff HK, Gr2 DD.  . Diabetes ([place])   . GERD (gastroesophageal reflux disease)   . HLD (hyperlipidemia)   . Hypertension   . NICM (nonischemic cardiomyopathy) ([place])    a. 03/2017 Echo: EF 20-25%, diff HK, Gr2 DD, mild to mod MR, nl RV fxn;  b. 03/2017 Cath: mild nonobs dzs, EF 25%.  . Pleural effusion, left    a. 03/2017 s/p thoracentesis-->300 ml withdrawn--transudative.  . Pneumonia 02/19/2017   Past Surgical History:  Procedure Laterality Date  . COLONOSCOPY WITH PROPOFOL N/A 01/21/2017   Procedure: COLONOSCOPY WITH PROPOFOL;  Surgeon: Jonathon Bellows, MD;  Location: [facility] ENDOSCOPY;  Service: Gastroenterology;  Laterality: N/A;  . ESOPHAGOGASTRODUODENOSCOPY (EGD) WITH PROPOFOL N/A 01/21/2017   Procedure: ESOPHAGOGASTRODUODENOSCOPY (EGD) WITH PROPOFOL;  Surgeon: Jonathon Bellows, MD;  Location: [facility] ENDOSCOPY;  Service: Gastroenterology;  Laterality: N/A;  . FLEXIBLE SIGMOIDOSCOPY N/A 11/04/2016   Procedure: FLEXIBLE SIGMOIDOSCOPY;  Surgeon: Wilford Corner, MD;  Location: [facility] ENDOSCOPY;  Service: Endoscopy;  Laterality: N/A;  . LEFT HEART CATH AND CORONARY ANGIOGRAPHY N/A 04/05/2017   Procedure: LEFT HEART CATH AND CORONARY ANGIOGRAPHY;  Surgeon: Wellington Hampshire, MD;  Location: [place] CV LAB;  Service: Cardiovascular;  Laterality: N/A;  . LITHOTRIPSY      Allergies  No  Known Allergies  History of Present Illness    51 year old female with the above complex past medical history including diabetes, hypertension, hyperlipidemia, chronic diarrhea, and GERD.  In October of this year, she was admitted to [facility] with pneumonia.  Symptoms improved with antibiotics the cough never fully resolved.  In late November, she was readmitted with a 2-week history of dyspnea and significant lower extremity edema.  She was found to have LV dysfunction with an EF of 20-25%, diffuse hypokinesis, and grade 2 diastolic dysfunction.  Catheterization was performed and revealed minimal nonobstructive CAD.  She was placed on medical therapy for nonischemic cardiomyopathy and effectively diuresed.  During hospitalization, we had tried to transition her to low-dose Entresto but she was having issues with hypotension and therefore, she was discharged on losartan.  She has since been followed in heart failure clinic and has done well.  Weight has been stable at home in the 96-97 pound range.  She has much more careful with her salt intake than she had been previously.  She does still eat out from time to time but has cut back significantly.  She denies chest pain, dyspnea, palpitations, PND, orthopnea, syncope, edema, or early satiety.  She occasionally notes lightheadedness when standing abruptly.  Home Medications    Prior to Admission medications   Medication Sig Start Date End Date Taking? Authorizing Provider  albuterol (PROVENTIL HFA;VENTOLIN HFA) 108 (90 Base) MCG/ACT inhaler Inhale 2 puffs into the lungs every 4 (four) hours as needed for wheezing or shortness of breath. 04/26/17  Yes Darylene Price A, FNP  aspirin 81 MG tablet Take 81 mg by mouth daily.  Yes [provider]  Blood Glucose Monitoring Suppl (FIFTY50 GLUCOSE METER 2.0) w/Device KIT Frequency:PHARMDIR   Dosage:0.0     Instructions:  Note:use as directed by [place] (up to four times daily) Dose: 1  LANCET 05/13/13  Yes [provider]  carvedilol (COREG) 3.125 MG tablet Take 1 tablet (3.125 mg total) by mouth 2 (two) times daily with a meal. 04/08/17  Yes Gladstone Lighter, MD  furosemide (LASIX) 20 MG tablet Take 1 tablet (20 mg total) by mouth daily. 04/09/17  Yes Gladstone Lighter, MD  glucose blood (BAYER CONTOUR TEST) test strip by Other route Two (2) times a day. 05/13/13  Yes [provider]  loperamide (IMODIUM) 2 MG capsule Take 2 capsules (4 mg total) by mouth as needed for diarrhea or loose stools. 11/06/16  Yes Dustin Flock, MD  spironolactone (ALDACTONE) 25 MG tablet Take 0.5 tablets (12.5 mg total) by mouth daily. 04/09/17  Yes Gladstone Lighter, MD    Review of Systems    She denies chest pain, palpitations, dyspnea, pnd, orthopnea, n, v, syncope, edema, weight gain, or early satiety.  She occasionally notes lightheadedness when standing up too abruptly.  All other systems reviewed and are otherwise negative except as noted above.  Physical Exam    VS:  BP 124/90 (BP Location: Left Arm, Patient Position: Sitting, Cuff Size: Normal)   Pulse 85   Ht '4\' 11"'$  (1.499 m)   Wt 98 lb 8 oz (44.7 kg)   BMI 19.89 kg/m  , BMI Body mass index is 19.89 kg/m. GEN: Well nourished, well developed, in no acute distress.  HEENT: normal.  Neck: Supple, no JVD, carotid bruits, or masses. Cardiac: RRR, no murmurs, rubs, or gallops. No clubbing, cyanosis, edema.  Radials/DP/PT 2+ and equal bilaterally.  Respiratory:  Respirations regular and unlabored, clear to auscultation bilaterally. GI: Soft, nontender, nondistended, BS + x 4. MS: no deformity or atrophy. Skin: warm and dry, no rash. Neuro:  Strength and sensation are intact. Psych: Normal affect.  Accessory Clinical Findings    ECG -regular sinus rhythm, 85, left axis deviation, inferior and anterolateral infarct.  Assessment & Plan    1.  Chronic combined systolic and diastolic congestive heart  failure/nonischemic cardiomyopathy: Patient was hospitalized in late November with a 2-week history of progressive dyspnea, weight gain, and edema.  Catheterization showed non-obstructive CAD.  EF 25-30% by echo.  She has been medically managed and doing well.  Weight has been stable at home at 96-97 pounds.  She is currently on beta-blocker, ARB, Lasix, and spironolactone.  Blood pressure was initially 124/90.  I repeated this given history of soft blood pressures and got 130/90.  In that setting, I will transition from losartan to Entresto 24/26 mg twice daily.  Plan to follow-up basic metabolic panel in 1 week.  We will place her heart failure clinic follow-up to 1 month from now.  We discussed the importance of daily weights, sodium restriction, medication compliance, and symptom reporting and she verbalizes understanding.   2.  Essential hypertension: Blood pressure was soft during hospitalization.  Higher today.  Will use this opportunity to switch her from losartan to [facility].  3.  Type 2 diabetes mellitus: A1c is 6.8.  She is not currently taking anything for diabetes.  Followed by primary care.  4.  Hyperlipidemia: This is listed in her history, though she has an LDL of 48 and is not on any medication for this.  5.  Disposition: Follow-up basic metabolic  panel in 1 week.  Follow-up in heart failure clinic in 1 month.  Follow-up with Dr. Rockey Situ in 2-3 months.  Murray Hodgkins, NP 05/06/2017, 8:56 AM

## 2017-05-06 NOTE — Patient Instructions (Signed)
Medication Instructions: - Your physician has recommended you make the following change in your medication:  1) STOP losartan 2) START entresto 24/26 mg- take 1 tablet by mouth twice daily  Labwork: - Your physician recommends that you return for lab work in: 1 week- BMP  Procedures/Testing: - none ordered  Follow-Up: - Your physician recommends that you schedule a follow-up appointment in: 1 month with Darylene Price, NP (please reschedule appointment that is scheduled for this week per Gerald Stabs)  - Your physician recommends that you schedule a follow-up appointment in: 3 months with Dr. Rockey Situ.   Any Additional Special Instructions Will Be Listed Below (If Applicable).     If you need a refill on your cardiac medications before your next appointment, please call your pharmacy.

## 2017-05-08 ENCOUNTER — Ambulatory Visit: Payer: Medicaid Other | Admitting: Family

## 2017-05-09 ENCOUNTER — Telehealth: Payer: Self-pay | Admitting: Nurse Practitioner

## 2017-05-09 NOTE — Telephone Encounter (Signed)
Form received from [facility] for Erin Bayley, NP to sign for the patient's entresto 24/26 mg BID.  Paperwork signed and completed. I left a message for Anne Ng at [facility] this is completed and at the front desk for pickup.

## 2017-05-13 ENCOUNTER — Other Ambulatory Visit: Payer: Self-pay | Admitting: *Deleted

## 2017-05-13 ENCOUNTER — Other Ambulatory Visit: Payer: Self-pay

## 2017-05-13 ENCOUNTER — Other Ambulatory Visit
Admission: RE | Admit: 2017-05-13 | Discharge: 2017-05-13 | Disposition: A | Discharge status: Home / Self Care | Payer: Medicaid Other | Source: Ambulatory Visit | Attending: Nurse Practitioner | Admitting: Nurse Practitioner

## 2017-05-13 ENCOUNTER — Other Ambulatory Visit (INDEPENDENT_AMBULATORY_CARE_PROVIDER_SITE_OTHER): Payer: Self-pay

## 2017-05-13 DIAGNOSIS — I5042 Chronic combined systolic (congestive) and diastolic (congestive) heart failure: Secondary | ICD-10-CM

## 2017-05-13 DIAGNOSIS — I509 Heart failure, unspecified: Secondary | ICD-10-CM

## 2017-05-13 DIAGNOSIS — E785 Hyperlipidemia, unspecified: Secondary | ICD-10-CM | POA: Insufficient documentation

## 2017-05-13 DIAGNOSIS — I428 Other cardiomyopathies: Secondary | ICD-10-CM

## 2017-05-13 DIAGNOSIS — I110 Hypertensive heart disease with heart failure: Secondary | ICD-10-CM | POA: Insufficient documentation

## 2017-05-13 DIAGNOSIS — E875 Hyperkalemia: Secondary | ICD-10-CM

## 2017-05-13 DIAGNOSIS — E119 Type 2 diabetes mellitus without complications: Secondary | ICD-10-CM | POA: Insufficient documentation

## 2017-05-13 DIAGNOSIS — Z79899 Other long term (current) drug therapy: Secondary | ICD-10-CM

## 2017-05-13 LAB — BASIC METABOLIC PANEL
ANION GAP: 11 (ref 5–15)
BUN: 12 mg/dL (ref 6–20)
CO2: 22 mmol/L (ref 22–32)
Calcium: 8.8 mg/dL — ABNORMAL LOW (ref 8.9–10.3)
Chloride: 104 mmol/L (ref 101–111)
Creatinine, Ser: 0.68 mg/dL (ref 0.44–1.00)
GFR calc Af Amer: >60 mL/min (ref >60)
GLUCOSE: 117 mg/dL — AB (ref 65–99)
POTASSIUM: 5.2 mmol/L — AB (ref 3.5–5.1)
Sodium: 137 mmol/L (ref 135–145)

## 2017-05-15 ENCOUNTER — Telehealth: Payer: Self-pay | Admitting: Cardiovascular Disease

## 2017-05-15 NOTE — Telephone Encounter (Signed)
Spoke with Erin Hayes over at [facility] and she was looking to see if we had received patients shipment of Entresto here in our office. Checked with front office and we have not received any medication shipments today. She requested that I call her if we should get some today or tomorrow. She was appreciative for the call with no further questions.

## 2017-05-15 NOTE — Telephone Encounter (Signed)
Annette from [facility] is calling  They had an order of Delene Loll that was to arrive there today and would like to see if it is possibly here Please give her a call

## 2017-05-17 ENCOUNTER — Telehealth: Payer: Self-pay | Admitting: Pharmacist

## 2017-05-17 NOTE — Telephone Encounter (Signed)
05/17/17 I have received the provider portion back signed & dx code: I42.8/I50.42. I am holding for patient to return her portion. This is for [facility] 24/26 Take one tablet by mouth 2 times a day.Delos Haring

## 2017-05-20 ENCOUNTER — Other Ambulatory Visit
Admission: RE | Admit: 2017-05-20 | Discharge: 2017-05-20 | Disposition: A | Discharge status: Home / Self Care | Payer: Medicaid Other | Source: Ambulatory Visit | Attending: Nurse Practitioner | Admitting: Nurse Practitioner

## 2017-05-20 ENCOUNTER — Telehealth: Payer: Self-pay | Admitting: Cardiovascular Disease

## 2017-05-20 DIAGNOSIS — E875 Hyperkalemia: Secondary | ICD-10-CM | POA: Insufficient documentation

## 2017-05-20 DIAGNOSIS — Z79899 Other long term (current) drug therapy: Secondary | ICD-10-CM | POA: Insufficient documentation

## 2017-05-20 DIAGNOSIS — I509 Heart failure, unspecified: Secondary | ICD-10-CM | POA: Insufficient documentation

## 2017-05-20 LAB — BASIC METABOLIC PANEL
Anion gap: 8 (ref 5–15)
BUN: 20 mg/dL (ref 6–20)
CALCIUM: 8.7 mg/dL — AB (ref 8.9–10.3)
CO2: 27 mmol/L (ref 22–32)
CREATININE: 0.76 mg/dL (ref 0.44–1.00)
Chloride: 102 mmol/L (ref 101–111)
GFR calc non Af Amer: >60 mL/min (ref >60)
Glucose, Bld: 166 mg/dL — ABNORMAL HIGH (ref 65–99)
Potassium: 3.8 mmol/L (ref 3.5–5.1)
Sodium: 137 mmol/L (ref 135–145)

## 2017-05-20 NOTE — Telephone Encounter (Signed)
Results reviewed with patient and she had no further questions.

## 2017-05-20 NOTE — Telephone Encounter (Signed)
Pt is returning our call   Please call back with Lab results

## 2017-06-06 ENCOUNTER — Encounter: Payer: Self-pay | Admitting: Family

## 2017-06-06 ENCOUNTER — Ambulatory Visit: Payer: Medicaid Other | Attending: Family | Admitting: Family

## 2017-06-06 VITALS — BP 141/106 | HR 80 | Resp 18 | Ht 59.0 in | Wt 101.2 lb

## 2017-06-06 DIAGNOSIS — Z79899 Other long term (current) drug therapy: Secondary | ICD-10-CM | POA: Insufficient documentation

## 2017-06-06 DIAGNOSIS — E785 Hyperlipidemia, unspecified: Secondary | ICD-10-CM | POA: Insufficient documentation

## 2017-06-06 DIAGNOSIS — I110 Hypertensive heart disease with heart failure: Secondary | ICD-10-CM | POA: Insufficient documentation

## 2017-06-06 DIAGNOSIS — K219 Gastro-esophageal reflux disease without esophagitis: Secondary | ICD-10-CM | POA: Insufficient documentation

## 2017-06-06 DIAGNOSIS — E119 Type 2 diabetes mellitus without complications: Secondary | ICD-10-CM | POA: Insufficient documentation

## 2017-06-06 DIAGNOSIS — Z8249 Family history of ischemic heart disease and other diseases of the circulatory system: Secondary | ICD-10-CM | POA: Insufficient documentation

## 2017-06-06 DIAGNOSIS — I5022 Chronic systolic (congestive) heart failure: Secondary | ICD-10-CM | POA: Insufficient documentation

## 2017-06-06 DIAGNOSIS — I10 Essential (primary) hypertension: Secondary | ICD-10-CM

## 2017-06-06 DIAGNOSIS — Z7982 Long term (current) use of aspirin: Secondary | ICD-10-CM | POA: Insufficient documentation

## 2017-06-06 LAB — GLUCOSE, CAPILLARY: GLUCOSE-CAPILLARY: 117 mg/dL — AB (ref 65–99)

## 2017-06-06 MED ORDER — SPIRONOLACTONE 25 MG PO TABS: 12.5000 mg | ORAL_TABLET | Freq: Every day | ORAL | 3 refills | Status: DC

## 2017-06-06 NOTE — Progress Notes (Signed)
Patient ID: Erin Hayes, female    DOB: Sep 24, 1965, 52 y.o.   MRN: [id number]  HPI  Erin Hayes is a 53 y/o female with a history of diabetes, GERD, hyperlipidemia, HTN, pneumonia and chronic heart failure.   Echo report from 04/04/17 reviewed and showed an EF of 20-25% along with a left pleural effusion. Cardiac catheterization done 04/05/17 and showed EF of 25% along with severely elevated LV end diastolic pressure along with mild nonobstructive CAD.  Admitted 04/03/17 due to acute HF. Cardiology consult obtained. Initially needed IV diuretics and transitioned to oral diuretics. Had echo and catheterization done during this admission. Discharged after 5 days. Admitted 02/18/17 due to LLL pneumonia. Initially needed IV antibiotics & then transitioned to oral antibiotics. Discharged after 2 days. Was in the ED 02/04/17 due to pneumonia where she was treated and released.   She presents today for her follow-up visit with a chief complaint of minimal fatigue upon moderate exertion. She says that this has been present for several years with varying levels of severity. She has associated head congestion, cough, intermittent chest pain and light-headedness along with this. She denies shortness of breath, wheezing, edema, palpitations, abdominal distention, difficulty sleeping or weight gain. She has not taken her medications yet today. She has been started on low dose entresto and has also continued to take her losartan.  Past Medical History:  Diagnosis Date  . Chronic combined systolic (congestive) and diastolic (congestive) heart failure ([place])    a. 03/2017 Echo: EF 20-25%, diff HK, Gr2 DD.  . Diabetes ([place])   . GERD (gastroesophageal reflux disease)   . HLD (hyperlipidemia)   . Hypertension   . NICM (nonischemic cardiomyopathy) ([place])    a. 03/2017 Echo: EF 20-25%, diff HK, Gr2 DD, mild to mod MR, nl RV fxn;  b. 03/2017 Cath: mild nonobs dzs, EF 25%.  . Pleural effusion, left    a. 03/2017 s/p  thoracentesis-->300 ml withdrawn--transudative.  . Pneumonia 02/19/2017   Past Surgical History:  Procedure Laterality Date  . COLONOSCOPY WITH PROPOFOL N/A 01/21/2017   Procedure: COLONOSCOPY WITH PROPOFOL;  Surgeon: Jonathon Bellows, MD;  Location: [facility] ENDOSCOPY;  Service: Gastroenterology;  Laterality: N/A;  . ESOPHAGOGASTRODUODENOSCOPY (EGD) WITH PROPOFOL N/A 01/21/2017   Procedure: ESOPHAGOGASTRODUODENOSCOPY (EGD) WITH PROPOFOL;  Surgeon: Jonathon Bellows, MD;  Location: [facility] ENDOSCOPY;  Service: Gastroenterology;  Laterality: N/A;  . FLEXIBLE SIGMOIDOSCOPY N/A 11/04/2016   Procedure: FLEXIBLE SIGMOIDOSCOPY;  Surgeon: Wilford Corner, MD;  Location: [facility] ENDOSCOPY;  Service: Endoscopy;  Laterality: N/A;  . LEFT HEART CATH AND CORONARY ANGIOGRAPHY N/A 04/05/2017   Procedure: LEFT HEART CATH AND CORONARY ANGIOGRAPHY;  Surgeon: Wellington Hampshire, MD;  Location: [place] CV LAB;  Service: Cardiovascular;  Laterality: N/A;  . LITHOTRIPSY     Family History  Problem Relation Age of Onset  . Lung cancer Mother   . Hypertension Father   . CAD Father        a. MI age 45  . COPD Neg Hx   . Diabetes Mellitus II Neg Hx    Social History   Tobacco Use  . Smoking status: Never Smoker  . Smokeless tobacco: Never Used  Substance Use Topics  . Alcohol use: No   No Known Allergies  Prior to Admission medications   Medication Sig Start Date End Date Taking? Authorizing Provider  albuterol (PROVENTIL HFA;VENTOLIN HFA) 108 (90 Base) MCG/ACT inhaler Inhale 2 puffs into the lungs every 4 (four) hours as needed for wheezing or shortness  of breath. 04/26/17  Yes Darylene Price A, FNP  aspirin 81 MG tablet Take 81 mg by mouth daily.   Yes [provider]  Blood Glucose Monitoring Suppl (FIFTY50 GLUCOSE METER 2.0) w/Device KIT Frequency:PHARMDIR   Dosage:0.0     Instructions:  Note:use as directed by [place] (up to four times daily) Dose: 1 LANCET 05/13/13  Yes [provider]   carvedilol (COREG) 3.125 MG tablet Take 1 tablet (3.125 mg total) by mouth 2 (two) times daily with a meal. 04/08/17  Yes Gladstone Lighter, MD  furosemide (LASIX) 20 MG tablet Take 1 tablet (20 mg total) by mouth daily. 04/09/17  Yes Gladstone Lighter, MD  glucose blood (BAYER CONTOUR TEST) test strip by Other route Two (2) times a day. 05/13/13  Yes [provider]  loperamide (IMODIUM) 2 MG capsule Take 2 capsules (4 mg total) by mouth as needed for diarrhea or loose stools. 11/06/16  Yes Dustin Flock, MD  sacubitril-valsartan (ENTRESTO) 24-26 MG Take 1 tablet by mouth 2 (two) times daily. 05/06/17  Yes Theora Gianotti, NP  spironolactone (ALDACTONE) 25 MG tablet Take 0.5 tablets (12.5 mg total) by mouth daily. 06/06/17  Yes Alisa Graff, FNP    Review of Systems  Constitutional: Positive for fatigue (mild). Negative for appetite change.  HENT: Positive for congestion and rhinorrhea. Negative for sore throat.   Eyes: Negative.   Respiratory: Positive for cough. Negative for chest tightness, shortness of breath and wheezing.   Cardiovascular: Positive for chest pain (2 days ago). Negative for palpitations and leg swelling.  Gastrointestinal: Negative for abdominal distention and abdominal pain.  Endocrine: Negative.   Genitourinary: Negative.   Musculoskeletal: Negative for back pain and neck pain.  Skin: Negative.   Allergic/Immunologic: Negative.   Neurological: Positive for light-headedness. Negative for dizziness.  Hematological: Negative for adenopathy. Does not bruise/bleed easily.  Psychiatric/Behavioral: Negative for dysphoric mood and sleep disturbance (sleeping on 3 pillows for comfort). The patient is not nervous/anxious.    Vitals:   06/06/17 1116  BP: (!) 141/106  Pulse: 80  Resp: 18  Weight: 101 lb 4 oz (45.9 kg)  Height: _0  (1.499 m)   Wt Readings from Last 3 Encounters:  06/06/17 101 lb 4 oz (45.9 kg)  05/06/17 98 lb 8 oz (44.7 kg)   04/26/17 90 lb (40.8 kg)   Lab Results  Component Value Date   CREATININE 0.76 05/20/2017   CREATININE 0.68 05/13/2017   CREATININE 0.63 04/08/2017    Physical Exam  Constitutional: She is oriented to person, place, and time. She appears well-developed and well-nourished.  HENT:  Head: Normocephalic and atraumatic.  Neck: Normal range of motion. Neck supple. No JVD present.  Cardiovascular: Normal rate and regular rhythm.  Pulmonary/Chest: Effort normal. She has no wheezes. She has no rales.  Abdominal: Soft. She exhibits no distension. There is no tenderness.  Musculoskeletal: She exhibits no edema or tenderness.  Neurological: She is alert and oriented to person, place, and time.  Skin: Skin is warm and dry.  Psychiatric: She has a normal mood and affect. Her behavior is normal. Thought content normal.  Nursing note and vitals reviewed.   Assessment & Plan:  1: Chronic heart failure with reduced ejection fraction- - NYHA class II - euvolemic today - weighing daily. Reminded to call for an overnight weight gain of >2 pounds or a weekly weight gain of >5 pounds - not adding salt to her food and she was reminded to  keep daily sodium intake to <2052m/day - entresto started at cardiology visit and she has continued to take her losartan - instructed her to stop the losartan since she's now on entresto - saw cardiology (Sharolyn Douglas 05/06/17 and returns to cardiology 08/02/17  2: HTN- - BP elevated today but she hasn't taken any of her medications yet today as she hasn't eaten yet; says that she will take everything as soon as she gets home - previous BP readings have been good - BMP from 05/20/17 reviewed and shows sodium 137, potassium 3.8 and GFR >60 - goes to [facility] for her primary care   3: Diabetes- - fasting glucose in clinic today was 117 - currently not on any medications for this  Medication bottles were reviewed.  Return in 3 months or sooner for any  questions/problems before then.

## 2017-06-06 NOTE — Patient Instructions (Signed)
Continue weighing daily and call for an overnight weight gain of > 2 pounds or a weekly weight gain of >5 pounds. 

## 2017-06-11 ENCOUNTER — Telehealth: Payer: Self-pay | Admitting: Pharmacist

## 2017-06-11 NOTE — Telephone Encounter (Signed)
06/11/17 Patient in office today, she asked about her Erin Hayes explained to patient that we mailed her a form 05/08/17 to sign & return for [facility]. She stated she did not receive, and address was verified. I reprinted form for patient to sign. The income information in patient chart is from Sept. 2018. We gave patient a ReCert packet to complete and return as soon as possible, we will need income/support & taxes/4506T returned to [place] before we can order Entresto. Income has to be submitted with application and they will not accept from Sept 2018. She will return quickly.Delos Haring

## 2017-07-08 ENCOUNTER — Other Ambulatory Visit: Payer: Self-pay | Admitting: Family

## 2017-07-08 MED ORDER — FUROSEMIDE 20 MG PO TABS: 20.0000 mg | ORAL_TABLET | Freq: Every day | ORAL | 3 refills | Status: DC

## 2017-07-18 ENCOUNTER — Ambulatory Visit: Payer: Medicaid Other

## 2017-07-18 ENCOUNTER — Telehealth: Payer: Self-pay | Admitting: Pharmacy Technician

## 2017-07-18 ENCOUNTER — Encounter (INDEPENDENT_AMBULATORY_CARE_PROVIDER_SITE_OTHER): Payer: Self-pay

## 2017-07-18 NOTE — Telephone Encounter (Signed)
Received updated proof of income.  Patient eligible to receive medication assistance at [facility] through 2019, as long as eligibility requirements continue to be met.  [facility]

## 2017-07-23 ENCOUNTER — Telehealth: Payer: Self-pay | Admitting: Pharmacist

## 2017-07-23 NOTE — Telephone Encounter (Signed)
07/23/2017 12:09:10 PM - Delene Loll 24/26  07/23/17 Faxed [employer] application for [employer] 24/26 Take one tablet by mouth 2 times a day. I am also sending a script to provider Murray Hodgkins, PA to sign, when I receive back, will fax to [employer] they will require that also.Delos Haring

## 2017-07-24 ENCOUNTER — Telehealth: Payer: Self-pay | Admitting: Cardiovascular Disease

## 2017-07-24 NOTE — Telephone Encounter (Signed)
Erin Hayes from [facility] dropped off forms to be completed and signed Placed in Nurse Box

## 2017-07-29 ENCOUNTER — Telehealth: Payer: Self-pay | Admitting: Pharmacist

## 2017-07-29 NOTE — Telephone Encounter (Signed)
07/29/2017 8:50:23 AM - Entresto/[employer] approval  07/29/17 Received letter from [employer] stating patient approved for Entresto 24/26 till 07/26/2018, Patient id# [id number].Erin Hayes

## 2017-07-31 ENCOUNTER — Telehealth: Payer: Self-pay | Admitting: Pharmacist

## 2017-07-31 NOTE — Telephone Encounter (Signed)
07/31/2017 8:36:52 AM - Erin Hayes  07/31/17 I have received a fax from [facility] on the script for Entresto 24/26-we submitted the application with provider Murray Hodgkins, NP-they are wanting the signature and name of "Supervising MD". I have sent message to Jule Ser nurse @ [facility], that I will be bring over form is she can help getting this signed and call me when ready to pick up. Will send cover sheet.[place] Coordinator [facility]

## 2017-08-02 ENCOUNTER — Ambulatory Visit: Payer: Self-pay | Admitting: Cardiovascular Disease

## 2017-08-02 NOTE — Telephone Encounter (Addendum)
Annette from [facility] dropped off updated form to be signed  Placed in Nurse Box

## 2017-08-02 NOTE — Telephone Encounter (Signed)
Form given to [facility], RN for Dr. Rockey Situ to have him sign.

## 2017-08-05 NOTE — Telephone Encounter (Signed)
Forms signed and placed in [facility] folder to be picked up.

## 2017-08-07 NOTE — Progress Notes (Deleted)
Cardiology Office Note  Date:  08/07/2017   ID:  Erin Hayes, DOB 01-Dec-1965, MRN [id number]  PCP:  System, Pcp Not In   No chief complaint on file.   HPI:  52 year old female with a history of  diabetes,  hypertension,  hyperlipidemia,  chronic diarrhea,  GERD,  pneumonia,  chronic combined systolic and diastolic congestive heart failure/nonischemic cardiomyopathy Who presents for routine follow-up of his CHF  Previous cardiac catheterization November 2018 Ejection fraction 25% or less, mild nonobstructive coronary disease, left ventricular end-diastolic pressure 33  PMH:   has a past medical history of Chronic combined systolic (congestive) and diastolic (congestive) heart failure ([place]), Diabetes ([place]), GERD (gastroesophageal reflux disease), HLD (hyperlipidemia), Hypertension, NICM (nonischemic cardiomyopathy) ([place]), Pleural effusion, left, and Pneumonia (02/19/2017).  PSH:    Past Surgical History:  Procedure Laterality Date  . COLONOSCOPY WITH PROPOFOL N/A 01/21/2017   Procedure: COLONOSCOPY WITH PROPOFOL;  Surgeon: Jonathon Bellows, MD;  Location: [facility] ENDOSCOPY;  Service: Gastroenterology;  Laterality: N/A;  . ESOPHAGOGASTRODUODENOSCOPY (EGD) WITH PROPOFOL N/A 01/21/2017   Procedure: ESOPHAGOGASTRODUODENOSCOPY (EGD) WITH PROPOFOL;  Surgeon: Jonathon Bellows, MD;  Location[facility] ENDOSCOPY;  Service: Gastroenterology;  Laterality: N/A;  . FLEXIBLE SIGMOIDOSCOPY N/A 11/04/2016   Procedure: FLEXIBLE SIGMOIDOSCOPY;  Surgeon: Wilford Corner, MD;  Location: [facility] ENDOSCOPY;  Service: Endoscopy;  Laterality: N/A;  . LEFT HEART CATH AND CORONARY ANGIOGRAPHY N/A 04/05/2017   Procedure: LEFT HEART CATH AND CORONARY ANGIOGRAPHY;  Surgeon: Wellington Hampshire, MD;  Location: [place] CV LAB;  Service: Cardiovascular;  Laterality: N/A;  . LITHOTRIPSY      Current Outpatient Medications  Medication Sig Dispense Refill  . albuterol (PROVENTIL HFA;VENTOLIN HFA) 108 (90 Base) MCG/ACT inhaler  Inhale 2 puffs into the lungs every 4 (four) hours as needed for wheezing or shortness of breath. 1 Inhaler 3  . aspirin 81 MG tablet Take 81 mg by mouth daily.    . Blood Glucose Monitoring Suppl (FIFTY50 GLUCOSE METER 2.0) w/Device KIT Frequency:PHARMDIR   Dosage:0.0     Instructions:  Note:use as directed by [place] (up to four times daily) Dose: 1 LANCET    . carvedilol (COREG) 3.125 MG tablet Take 1 tablet (3.125 mg total) by mouth 2 (two) times daily with a meal. 60 tablet 2  . furosemide (LASIX) 20 MG tablet Take 1 tablet (20 mg total) by mouth daily. 90 tablet 3  . glucose blood (BAYER CONTOUR TEST) test strip by Other route Two (2) times a day.    . loperamide (IMODIUM) 2 MG capsule Take 2 capsules (4 mg total) by mouth as needed for diarrhea or loose stools. 30 capsule 0  . sacubitril-valsartan (ENTRESTO) 24-26 MG Take 1 tablet by mouth 2 (two) times daily. 60 tablet 6  . spironolactone (ALDACTONE) 25 MG tablet Take 0.5 tablets (12.5 mg total) by mouth daily. 45 tablet 3   No current facility-administered medications for this visit.      Allergies:   Patient has no known allergies.   Social History:  The patient  reports that she has never smoked. She has never used smokeless tobacco. She reports that she does not drink alcohol or use drugs.   Family History:   family history includes CAD in her father; Hypertension in her father; Lung cancer in her mother.    Review of Systems: ROS   PHYSICAL EXAM: VS:  There were no vitals taken for this visit. , BMI There is no height or weight on file to calculate  BMI. GEN: Well nourished, well developed, in no acute distress HEENT: normal Neck: no JVD, carotid bruits, or masses Cardiac: RRR; no murmurs, rubs, or gallops,no edema  Respiratory:  clear to auscultation bilaterally, normal work of breathing GI: soft, nontender, nondistended, + BS MS: no deformity or atrophy Skin: warm and dry, no rash Neuro:  Strength and  sensation are intact Psych: euthymic mood, full affect    Recent Labs: 04/03/2017: ALT 14; B Natriuretic Peptide 1,287.0 04/04/2017: Hemoglobin 10.3; Platelets 339; TSH 0.975 04/08/2017: Magnesium 2.3 05/20/2017: BUN 20; Creatinine, Ser 0.76; Potassium 3.8; Sodium 137    Lipid Panel Lab Results  Component Value Date   CHOL 157 12/13/2016   HDL 72 12/13/2016   LDLCALC 48 12/13/2016   TRIG 186 (H) 12/13/2016      Wt Readings from Last 3 Encounters:  06/06/17 101 lb 4 oz (45.9 kg)  05/06/17 98 lb 8 oz (44.7 kg)  04/26/17 90 lb (40.8 kg)       ASSESSMENT AND PLAN:  No diagnosis found.   Disposition:   F/U  6 months  No orders of the defined types were placed in this encounter.    Signed, Esmond Plants, M.D., Ph.D. 08/07/2017  [place], [place]

## 2017-08-08 ENCOUNTER — Ambulatory Visit: Payer: Self-pay | Admitting: Cardiovascular Disease

## 2017-08-09 ENCOUNTER — Telehealth: Payer: Self-pay | Admitting: Pharmacist

## 2017-08-09 ENCOUNTER — Telehealth: Payer: Self-pay | Admitting: Cardiovascular Disease

## 2017-08-09 ENCOUNTER — Encounter: Payer: Self-pay | Admitting: Cardiovascular Disease

## 2017-08-09 NOTE — Telephone Encounter (Signed)
error 

## 2017-08-09 NOTE — Telephone Encounter (Signed)
08/09/2017 11:54:46 AM - Entresto/[employer]  08/09/17 Faxed [employer] forms back for [employer] the Dr. Rockey Situ signed as supervisor for Murray Hodgkins.Delos Haring

## 2017-08-15 ENCOUNTER — Other Ambulatory Visit: Payer: Self-pay

## 2017-08-15 ENCOUNTER — Telehealth: Payer: Self-pay

## 2017-08-15 NOTE — Telephone Encounter (Signed)
Received rx refills on lisinopril, carvedilol, and losartan. Carvedilol and losartan were rx'd by another doctor and were showing some drug interactions. Called pt to schedule a f/u appt to discuss refills.

## 2017-08-21 ENCOUNTER — Encounter: Payer: Self-pay | Admitting: Internal Medicine

## 2017-08-21 ENCOUNTER — Ambulatory Visit: Payer: Medicaid Other | Admitting: Internal Medicine

## 2017-08-21 VITALS — BP 120/88 | HR 100 | Temp 98.1°F | Wt 106.6 lb

## 2017-08-21 DIAGNOSIS — I5022 Chronic systolic (congestive) heart failure: Secondary | ICD-10-CM

## 2017-08-21 NOTE — Progress Notes (Signed)
   Subjective:    Patient ID: Erin Hayes, female    DOB: 01/01/1966, 52 y.o.   MRN: [id number]  HPI  Pt is here for refills on medications (insulin?). She has no new complaints. Went to the hospital a few months ago with pneumonia and found out that she has heart failure. She has an appointment with a cardiologist tomorrow (April 18).   Allergies as of 08/21/2017   No Known Allergies     Medication List        Accurate as of 08/21/17 10:15 AM. Always use your most recent med list.          albuterol 108 (90 Base) MCG/ACT inhaler Commonly known as:  PROVENTIL HFA;VENTOLIN HFA Inhale 2 puffs into the lungs every 4 (four) hours as needed for wheezing or shortness of breath.   aspirin 81 MG tablet Take 81 mg by mouth daily.   BAYER CONTOUR TEST test strip Generic drug:  glucose blood by Other route Two (2) times a day.   carvedilol 3.125 MG tablet Commonly known as:  COREG Take 1 tablet (3.125 mg total) by mouth 2 (two) times daily with a meal.   FIFTY50 GLUCOSE METER 2.0 w/Device Kit Frequency:PHARMDIR   Dosage:0.0     Instructions:  Note:use as directed by [place] (up to four times daily) Dose: 1 LANCET   furosemide 20 MG tablet Commonly known as:  LASIX Take 1 tablet (20 mg total) by mouth daily.   loperamide 2 MG capsule Commonly known as:  IMODIUM Take 2 capsules (4 mg total) by mouth as needed for diarrhea or loose stools.   sacubitril-valsartan 24-26 MG Commonly known as:  ENTRESTO Take 1 tablet by mouth 2 (two) times daily.   spironolactone 25 MG tablet Commonly known as:  ALDACTONE Take 0.5 tablets (12.5 mg total) by mouth daily.      Patient Active Problem List   Diagnosis Date Noted  . CHF (congestive heart failure) ([place]) 04/03/2017  . Protein-calorie malnutrition, severe 11/02/2016  . Hypokalemia 04/28/2013  . Leukocytosis, unspecified 04/28/2013  . Diabetes ([place])   . Hypertension   . Hypercholesterolemia 11/10/2012       Review of Systems     Objective:   Physical Exam  Constitutional: She is oriented to person, place, and time.  Cardiovascular: Normal rate, regular rhythm and normal heart sounds.  Pulmonary/Chest: Effort normal and breath sounds normal.  Neurological: She is alert and oriented to person, place, and time.    BP 120/88   Pulse 100   Temp 98.1 F (36.7 C) (Oral)   Wt 106 lb 9.6 oz (48.4 kg)   BMI 21.53 kg/m       Assessment & Plan:   Order blood tests for cardiologist appointment at [facility].  Pt has not been using insulin for a long time and does not appear to need this medication. Will check her A1c level today.  Pt looks well. Lungs are clear and no signs of heart failure. Is noted that heart rate is elevated. Apparently is not taking a beta blocker(?)COREG. It is unknown whether this needs to be taken. She has an appt with cardiologist tommorow and this can be addressed at that time.   F/u in 3 months with labs a week prior.

## 2017-08-21 NOTE — Progress Notes (Signed)
Cardiology Office Note  Date:  08/22/2017   ID:  Erin Hayes, DOB 01-08-1966, MRN [id number]  PCP:  System, Pcp Not In   Chief Complaint  Patient presents with  . other    3 month follow up. Meds reviewed by the pt. verbally. "doing well."     HPI:  52 year old female with a history of  diabetes,  hypertension,  hyperlipidemia,  chronic diarrhea,  GERD,  pneumonia,  chronic combined systolic and diastolic congestive heart failure/nonischemic cardiomyopathy By cath EF 15-20%, 20-25% by echo Left pleural effusion Who presents for routine follow-up of her nonischemic cardiomyopathy  Recent events from the past 6 months reviewed with her in detail  ED 02/04/17 due to pneumonia  Admitted 02/18/17 due to LLL pneumonia. Initially needed IV antibiotics & then transitioned to oral antibiotics. Discharged  Admitted 04/03/17 due to acute HF.   IV diuretics  echo and  catheterization by Dr.  Fletcher Anon showing nonischemic cardiomyopathy ejection fraction less than 25%   Recently she reports she has been doing well, denies any lower extremity edema or shortness of breath or abdominal bloating Weight has been stable, Carvedilol is on her list but she does not have this medication No significant change in her weight Feels almost back to normal  EKG personally reviewed by myself on todays visit Shows normal sinus rhythm rate 88 bpm intraventricular conduction delay left axis deviation  Prior medical records reviewed  cardiac catheterization November 2018 Ejection fraction 25% or less, mild nonobstructive coronary disease, left ventricular end-diastolic pressure 33    PMH:   has a past medical history of Chronic combined systolic (congestive) and diastolic (congestive) heart failure ([place]), Diabetes ([place]), GERD (gastroesophageal reflux disease), HLD (hyperlipidemia), Hypertension, NICM (nonischemic cardiomyopathy) ([place]), Pleural effusion, left, and Pneumonia (02/19/2017).  PSH:    Past  Surgical History:  Procedure Laterality Date  . COLONOSCOPY WITH PROPOFOL N/A 01/21/2017   Procedure: COLONOSCOPY WITH PROPOFOL;  Surgeon: Jonathon Bellows, MD;  Location: [facility] ENDOSCOPY;  Service: Gastroenterology;  Laterality: N/A;  . ESOPHAGOGASTRODUODENOSCOPY (EGD) WITH PROPOFOL N/A 01/21/2017   Procedure: ESOPHAGOGASTRODUODENOSCOPY (EGD) WITH PROPOFOL;  Surgeon: Jonathon Bellows, MD;  Location: [facility] ENDOSCOPY;  Service: Gastroenterology;  Laterality: N/A;  . FLEXIBLE SIGMOIDOSCOPY N/A 11/04/2016   Procedure: FLEXIBLE SIGMOIDOSCOPY;  Surgeon: Wilford Corner, MD;  Location: [facility] ENDOSCOPY;  Service: Endoscopy;  Laterality: N/A;  . LEFT HEART CATH AND CORONARY ANGIOGRAPHY N/A 04/05/2017   Procedure: LEFT HEART CATH AND CORONARY ANGIOGRAPHY;  Surgeon: Wellington Hampshire, MD;  Location: [facility] CV LAB;  Service: Cardiovascular;  Laterality: N/A;  . LITHOTRIPSY      Current Outpatient Medications  Medication Sig Dispense Refill  . albuterol (PROVENTIL HFA;VENTOLIN HFA) 108 (90 Base) MCG/ACT inhaler Inhale 2 puffs into the lungs every 4 (four) hours as needed for wheezing or shortness of breath. 1 Inhaler 3  . aspirin 81 MG tablet Take 81 mg by mouth daily.    . Blood Glucose Monitoring Suppl (FIFTY50 GLUCOSE METER 2.0) w/Device KIT Frequency:PHARMDIR   Dosage:0.0     Instructions:  Note:use as directed by [place] (up to four times daily) Dose: 1 LANCET    . furosemide (LASIX) 20 MG tablet Take 1 tablet (20 mg total) by mouth daily. 90 tablet 3  . glucose blood (BAYER CONTOUR TEST) test strip by Other route Two (2) times a day.    . loperamide (IMODIUM) 2 MG capsule Take 2 capsules (4 mg total) by mouth as needed for diarrhea or loose  stools. 30 capsule 0  . sacubitril-valsartan (ENTRESTO) 24-26 MG Take 1 tablet by mouth 2 (two) times daily. 60 tablet 6  . spironolactone (ALDACTONE) 25 MG tablet Take 0.5 tablets (12.5 mg total) by mouth daily. 45 tablet 3   No current  facility-administered medications for this visit.      Allergies:   Patient has no known allergies.   Social History:  The patient  reports that she has never smoked. She has never used smokeless tobacco. She reports that she does not drink alcohol or use drugs.   Family History:   family history includes CAD in her father; Hypertension in her father; Lung cancer in her mother.    Review of Systems: Review of Systems  Constitutional: Negative.   Respiratory: Negative.   Cardiovascular: Negative.   Gastrointestinal: Negative.   Musculoskeletal: Negative.   Neurological: Negative.   Psychiatric/Behavioral: Negative.   All other systems reviewed and are negative.    PHYSICAL EXAM: VS:  BP 122/90 (BP Location: Left Arm, Patient Position: Sitting, Cuff Size: Normal)   Pulse 86   Ht _0  (1.499 m)   Wt 106 lb (48.1 kg)   BMI 21.41 kg/m  , BMI Body mass index is 21.41 kg/m. GEN: Well nourished, well developed, in no acute distress  HEENT: normal  Neck: no JVD, carotid bruits, or masses Cardiac: RRR; no murmurs, rubs, or gallops,no edema  Respiratory:  clear to auscultation bilaterally, normal work of breathing GI: soft, nontender, nondistended, + BS MS: no deformity or atrophy  Skin: warm and dry, no rash Neuro:  Strength and sensation are intact Psych: euthymic mood, full affect    Recent Labs: 04/03/2017: ALT 14; B Natriuretic Peptide 1,287.0 04/04/2017: Hemoglobin 10.3; Platelets 339; TSH 0.975 04/08/2017: Magnesium 2.3 05/20/2017: BUN 20; Creatinine, Ser 0.76; Potassium 3.8; Sodium 137    Lipid Panel Lab Results  Component Value Date   CHOL 157 12/13/2016   HDL 72 12/13/2016   LDLCALC 48 12/13/2016   TRIG 186 (H) 12/13/2016      Wt Readings from Last 3 Encounters:  08/22/17 106 lb (48.1 kg)  08/21/17 106 lb 9.6 oz (48.4 kg)  06/06/17 101 lb 4 oz (45.9 kg)       ASSESSMENT AND PLAN:  Chronic systolic congestive heart failure (HCC) - Plan: EKG  12-Lead Weight is stable, appears euvolemic We will add Coreg 3.125 mg twice daily,  New prescription sent Recommended repeat echocardiogram in 1 month's time  Type 2 diabetes mellitus without complication, without long-term current use of insulin (HCC) Previous hemoglobin A1c 6.8, 4 years ago was 18 Encouraged continued medication and diet compliance  NICM (nonischemic cardiomyopathy) (HCC) 1 medication change today, will add low-dose beta-blocker then repeat echocardiogram in 1 months Might be able to handle higher dose entresto on next clinic visit with CHF clinic  Essential hypertension Changes as above, stable  Disposition:   F/U  6 months   Total encounter time more than 25 minutes  Greater than 50% was spent in counseling and coordination of care with the patient    Orders Placed This Encounter  Procedures  . EKG 12-Lead     Signed, Esmond Plants, M.D., Ph.D. 08/22/2017  [place], [place]

## 2017-08-22 ENCOUNTER — Encounter: Payer: Self-pay | Admitting: Cardiovascular Disease

## 2017-08-22 ENCOUNTER — Ambulatory Visit (INDEPENDENT_AMBULATORY_CARE_PROVIDER_SITE_OTHER): Payer: Self-pay | Admitting: Cardiovascular Disease

## 2017-08-22 VITALS — BP 122/90 | HR 86 | Ht 59.0 in | Wt 106.0 lb

## 2017-08-22 DIAGNOSIS — E119 Type 2 diabetes mellitus without complications: Secondary | ICD-10-CM

## 2017-08-22 DIAGNOSIS — I10 Essential (primary) hypertension: Secondary | ICD-10-CM

## 2017-08-22 DIAGNOSIS — I428 Other cardiomyopathies: Secondary | ICD-10-CM

## 2017-08-22 DIAGNOSIS — I5022 Chronic systolic (congestive) heart failure: Secondary | ICD-10-CM

## 2017-08-22 MED ORDER — CARVEDILOL 3.125 MG PO TABS: 3.1250 mg | ORAL_TABLET | Freq: Two times a day (BID) | ORAL | 4 refills | Status: DC

## 2017-08-22 NOTE — Patient Instructions (Addendum)
Medication Instructions:  Your physician has recommended you make the following change in your medication:  1. START Carvedilol (Coreg) 3.125 mg twice a day  Labwork:  No new labs needed  Testing/Procedures:  Echocardiogram in a month mid May (roughly)   Follow-Up: It was a pleasure seeing you in the office today. Please call us if you have new issues that need to be addressed before your next appt.  [phone]  Your physician wants you to follow-up in: 6 months.  You will receive a reminder letter in the mail two months in advance. If you don't receive a letter, please call our office to schedule the follow-up appointment.  If you need a refill on your cardiac medications before your next appointment, please call your pharmacy.  For educational health videos Log in to : [email or web address] Or : [email or web address], password : [email or web address]

## 2017-08-26 ENCOUNTER — Ambulatory Visit: Payer: Medicaid Other

## 2017-08-26 VITALS — BP 112/84 | Ht 59.0 in | Wt 106.0 lb

## 2017-08-26 DIAGNOSIS — Z79899 Other long term (current) drug therapy: Secondary | ICD-10-CM

## 2017-08-26 NOTE — Progress Notes (Signed)
Medication Management Clinic Visit Note  Patient: Erin Hayes MRN: [id number] Date of Birth: 1965/12/24 PCP: Erin Acosta, NP   Erin Hayes 52 y.o. female presents for a medication therapy managment visit today.  BP 112/84 (BP Location: Right Arm, Patient Position: Sitting, Cuff Size: Large)   Ht _0  (1.499 m)   Wt 106 lb (48.1 kg)   BMI 21.41 kg/m   Patient Information   Past Medical History:  Diagnosis Date  . Chronic combined systolic (congestive) and diastolic (congestive) heart failure ([place])    a. 03/2017 Echo: EF 20-25%, diff HK, Gr2 DD.  . Diabetes ([place])   . GERD (gastroesophageal reflux disease)   . HLD (hyperlipidemia)   . Hypertension   . NICM (nonischemic cardiomyopathy) ([place])    a. 03/2017 Echo: EF 20-25%, diff HK, Gr2 DD, mild to mod MR, nl RV fxn;  b. 03/2017 Cath: mild nonobs dzs, EF 25%.  . Pleural effusion, left    a. 03/2017 s/p thoracentesis-->300 ml withdrawn--transudative.  . Pneumonia 02/19/2017      Past Surgical History:  Procedure Laterality Date  . COLONOSCOPY WITH PROPOFOL N/A 01/21/2017   Procedure: COLONOSCOPY WITH PROPOFOL;  Surgeon: Jonathon Bellows, MD;  Location: [facility] ENDOSCOPY;  Service: Gastroenterology;  Laterality: N/A;  . ESOPHAGOGASTRODUODENOSCOPY (EGD) WITH PROPOFOL N/A 01/21/2017   Procedure: ESOPHAGOGASTRODUODENOSCOPY (EGD) WITH PROPOFOL;  Surgeon: Jonathon Bellows, MD;  Location: [facility] ENDOSCOPY;  Service: Gastroenterology;  Laterality: N/A;  . FLEXIBLE SIGMOIDOSCOPY N/A 11/04/2016   Procedure: FLEXIBLE SIGMOIDOSCOPY;  Surgeon: Wilford Corner, MD;  Location: [facility] ENDOSCOPY;  Service: Endoscopy;  Laterality: N/A;  . LEFT HEART CATH AND CORONARY ANGIOGRAPHY N/A 04/05/2017   Procedure: LEFT HEART CATH AND CORONARY ANGIOGRAPHY;  Surgeon: Wellington Hampshire, MD;  Location: [facility] CV LAB;  Service: Cardiovascular;  Laterality: N/A;  . LITHOTRIPSY       Family History  Problem Relation Age of Onset  . Lung cancer Mother    . Hypertension Father   . CAD Father        a. MI age 90  . COPD Neg Hx   . Diabetes Mellitus II Neg Hx     New Diagnoses (since last visit): NA  Family Support: Good  Lifestyle Diet: Limits sodium intake to <800 mg/meal.         Social History   Substance and Sexual Activity  Alcohol Use No     Social History   Tobacco Use  Smoking Status Never Smoker  Smokeless Tobacco Never Used     Health Maintenance  Topic Date Due  . Hepatitis C Screening  1965/07/09  . FOOT EXAM  11/14/1973  . OPHTHALMOLOGY EXAM  11/14/1973  . URINE MICROALBUMIN  11/14/1973  . TETANUS/TDAP  11/15/1982  . PAP SMEAR  11/14/1984  . MAMMOGRAM  11/14/2013  . PNEUMOCOCCAL POLYSACCHARIDE VACCINE (2) 12/11/2016  . HEMOGLOBIN A1C  10/03/2017  . INFLUENZA VACCINE  12/05/2017  . COLONOSCOPY  01/21/2022  . HIV Screening  Completed   Outpatient Encounter Medications as of 08/26/2017  Medication Sig  . albuterol (PROVENTIL HFA;VENTOLIN HFA) 108 (90 Base) MCG/ACT inhaler Inhale 2 puffs into the lungs every 4 (four) hours as needed for wheezing or shortness of breath.  [name] aspirin 81 MG tablet Take 81 mg by mouth daily.  . Blood Glucose Monitoring Suppl (FIFTY50 GLUCOSE METER 2.0) w/Device KIT Frequency:PHARMDIR   Dosage:0.0     Instructions:  Note:use as directed by [place] (up to four times daily) Dose: 1 LANCET  .  carvedilol (COREG) 3.125 MG tablet Take 1 tablet (3.125 mg total) by mouth 2 (two) times daily with a meal.  . furosemide (LASIX) 20 MG tablet Take 1 tablet (20 mg total) by mouth daily.  [name] glucose blood (BAYER CONTOUR TEST) test strip by Other route Two (2) times a day.  . loperamide (IMODIUM) 2 MG capsule Take 2 capsules (4 mg total) by mouth as needed for diarrhea or loose stools.  . sacubitril-valsartan (ENTRESTO) 24-26 MG Take 1 tablet by mouth 2 (two) times daily.  [name] spironolactone (ALDACTONE) 25 MG tablet Take 0.5 tablets (12.5 mg total) by mouth daily.   No  facility-administered encounter medications on file as of 08/26/2017.     Assessment and Plan:  Medication Compliance: Patient patient is able to verbalize the names of her medications, their indications and dosing frequency with little prompting. States that she never misses doses of her medications, and keeps track of her and her daughters medications. States that she no longer has a pill box and would like one, so one was given to her today.   CHF: Patient currently taking Entresto, spironolactone, furosemide and carvediol (picking up and staring today). States that she is tolerating her medications well and feels that she is currently asymptomatic. She is limiting her sodium intake to <800 mg/meal and monitors her weight daily.   DM: Patient is not currently taking any ADMs, last A1c 6.8% (03/2017). She does check her blood glucose ~2/week and states that her fasting is ~110-115 mg/dL.   RTC: 1 year  Lewie Loron, PharmD Candidate

## 2017-08-27 ENCOUNTER — Other Ambulatory Visit: Payer: Medicaid Other

## 2017-08-27 DIAGNOSIS — I5042 Chronic combined systolic (congestive) and diastolic (congestive) heart failure: Secondary | ICD-10-CM

## 2017-08-27 DIAGNOSIS — I5022 Chronic systolic (congestive) heart failure: Secondary | ICD-10-CM

## 2017-08-27 DIAGNOSIS — E0800 Diabetes mellitus due to underlying condition with hyperosmolarity without nonketotic hyperglycemic-hyperosmolar coma (NKHHC): Secondary | ICD-10-CM

## 2017-08-28 LAB — COMPREHENSIVE METABOLIC PANEL
A/G RATIO: 1.5 (ref 1.2–2.2)
ALT: 21 IU/L (ref 0–32)
AST: 21 IU/L (ref 0–40)
Albumin: 4.3 g/dL (ref 3.5–5.5)
Alkaline Phosphatase: 113 IU/L (ref 39–117)
BILIRUBIN TOTAL: 0.4 mg/dL (ref 0.0–1.2)
BUN/Creatinine Ratio: 15 (ref 9–23)
BUN: 13 mg/dL (ref 6–24)
CALCIUM: 9.2 mg/dL (ref 8.7–10.2)
CHLORIDE: 105 mmol/L (ref 96–106)
CO2: 16 mmol/L — ABNORMAL LOW (ref 20–29)
Creatinine, Ser: 0.87 mg/dL (ref 0.57–1.00)
GFR, EST AFRICAN AMERICAN: 88 mL/min/{1.73_m2} (ref >59)
GFR, EST NON AFRICAN AMERICAN: 76 mL/min/{1.73_m2} (ref >59)
GLOBULIN, TOTAL: 2.8 g/dL (ref 1.5–4.5)
Glucose: 156 mg/dL — ABNORMAL HIGH (ref 65–99)
POTASSIUM: 4.4 mmol/L (ref 3.5–5.2)
Sodium: 141 mmol/L (ref 134–144)
TOTAL PROTEIN: 7.1 g/dL (ref 6.0–8.5)

## 2017-08-28 LAB — MAGNESIUM: MAGNESIUM: 1.7 mg/dL (ref 1.6–2.3)

## 2017-08-29 ENCOUNTER — Other Ambulatory Visit: Payer: Medicaid Other

## 2017-09-01 NOTE — Progress Notes (Signed)
Patient ID: Erin Hayes, female    DOB: 1965-06-04, 52 y.o.   MRN: [id number]  HPI  Erin Hayes is a 52 y/o female with a history of diabetes, GERD, hyperlipidemia, HTN, pneumonia and chronic heart failure.   Echo report from 04/04/17 reviewed and showed an EF of 20-25% along with a left pleural effusion. Cardiac catheterization done 04/05/17 and showed EF of 25% along with severely elevated LV end diastolic pressure along with mild nonobstructive CAD.  Admitted 04/03/17 due to acute HF. Cardiology consult obtained. Initially needed IV diuretics and transitioned to oral diuretics. Had echo and catheterization done during this admission. Discharged after 5 days.   She presents today for her follow-up visit with a chief complaint of minimal fatigue upon moderate exertion. She describes this as chronic in nature having been present for several years with varying levels of severity. She has associated intermittent chest pain and light-headedness along with this. She denies any difficulty sleeping, abdominal distention, palpitations, pedal edema, shortness of breath, cough or weight gain. Currently scheduled for an echocardiogram 09/10/17   Past Medical History:  Diagnosis Date  . Chronic combined systolic (congestive) and diastolic (congestive) heart failure ([place])    a. 03/2017 Echo: EF 20-25%, diff HK, Gr2 DD.  . Diabetes ([place])   . GERD (gastroesophageal reflux disease)   . HLD (hyperlipidemia)   . Hypertension   . NICM (nonischemic cardiomyopathy) ([place])    a. 03/2017 Echo: EF 20-25%, diff HK, Gr2 DD, mild to mod MR, nl RV fxn;  b. 03/2017 Cath: mild nonobs dzs, EF 25%.  . Pleural effusion, left    a. 03/2017 s/p thoracentesis-->300 ml withdrawn--transudative.  . Pneumonia 02/19/2017   Past Surgical History:  Procedure Laterality Date  . COLONOSCOPY WITH PROPOFOL N/A 01/21/2017   Procedure: COLONOSCOPY WITH PROPOFOL;  Surgeon: Jonathon Bellows, MD;  Location: [facility] ENDOSCOPY;  Service:  Gastroenterology;  Laterality: N/A;  . ESOPHAGOGASTRODUODENOSCOPY (EGD) WITH PROPOFOL N/A 01/21/2017   Procedure: ESOPHAGOGASTRODUODENOSCOPY (EGD) WITH PROPOFOL;  Surgeon: Jonathon Bellows, MD;  Location: [facility] ENDOSCOPY;  Service: Gastroenterology;  Laterality: N/A;  . FLEXIBLE SIGMOIDOSCOPY N/A 11/04/2016   Procedure: FLEXIBLE SIGMOIDOSCOPY;  Surgeon: Wilford Corner, MD;  Location: [facility] ENDOSCOPY;  Service: Endoscopy;  Laterality: N/A;  . LEFT HEART CATH AND CORONARY ANGIOGRAPHY N/A 04/05/2017   Procedure: LEFT HEART CATH AND CORONARY ANGIOGRAPHY;  Surgeon: Wellington Hampshire, MD;  Location: [facility] CV LAB;  Service: Cardiovascular;  Laterality: N/A;  . LITHOTRIPSY     Family History  Problem Relation Age of Onset  . Lung cancer Mother   . Hypertension Father   . CAD Father        a. MI age 55  . COPD Neg Hx   . Diabetes Mellitus II Neg Hx    Social History   Tobacco Use  . Smoking status: Never Smoker  . Smokeless tobacco: Never Used  Substance Use Topics  . Alcohol use: No   No Known Allergies  Prior to Admission medications   Medication Sig Start Date End Date Taking? Authorizing Provider  albuterol (PROVENTIL HFA;VENTOLIN HFA) 108 (90 Base) MCG/ACT inhaler Inhale 2 puffs into the lungs every 4 (four) hours as needed for wheezing or shortness of breath. 04/26/17  Yes Darylene Price A, FNP  aspirin 81 MG tablet Take 81 mg by mouth daily.   Yes [provider]  Blood Glucose Monitoring Suppl (FIFTY50 GLUCOSE METER 2.0) w/Device KIT Frequency:PHARMDIR   Dosage:0.0     Instructions:  Note:use as  directed by [place] (up to four times daily) Dose: 1 LANCET 05/13/13  Yes [provider]  carvedilol (COREG) 3.125 MG tablet Take 1 tablet (3.125 mg total) by mouth 2 (two) times daily with a meal. 08/22/17  Yes Gollan, Kathlene November, MD  furosemide (LASIX) 20 MG tablet Take 1 tablet (20 mg total) by mouth daily. 07/08/17  Yes Modine Oppenheimer A, FNP  glucose blood (BAYER  CONTOUR TEST) test strip by Other route Two (2) times a day. 05/13/13  Yes [provider]  loperamide (IMODIUM) 2 MG capsule Take 2 capsules (4 mg total) by mouth as needed for diarrhea or loose stools. 11/06/16  Yes Dustin Flock, MD  sacubitril-valsartan (ENTRESTO) 24-26 MG Take 1 tablet by mouth 2 (two) times daily. 05/06/17  Yes Theora Gianotti, NP  spironolactone (ALDACTONE) 25 MG tablet Take 0.5 tablets (12.5 mg total) by mouth daily. 06/06/17  Yes Alisa Graff, FNP   Review of Systems  Constitutional: Positive for fatigue (mild). Negative for appetite change.  HENT: Positive for rhinorrhea. Negative for congestion and sore throat.   Eyes: Negative.   Respiratory: Negative for cough, chest tightness, shortness of breath and wheezing.   Cardiovascular: Positive for chest pain (intermittently). Negative for palpitations and leg swelling.  Gastrointestinal: Negative for abdominal distention and abdominal pain.  Endocrine: Negative.   Genitourinary: Negative.   Musculoskeletal: Negative for back pain and neck pain.  Skin: Negative.   Allergic/Immunologic: Negative.   Neurological: Positive for light-headedness. Negative for dizziness.  Hematological: Negative for adenopathy. Does not bruise/bleed easily.  Psychiatric/Behavioral: Negative for dysphoric mood and sleep disturbance (sleeping on 3 pillows for comfort). The patient is not nervous/anxious.    Vitals:   09/03/17 1119  BP: (!) 132/95  Pulse: 87  Resp: 18  SpO2: 99%  Weight: 108 lb (49 kg)  Height: '4\' 11"'$  (1.499 m)   Wt Readings from Last 3 Encounters:  09/03/17 108 lb (49 kg)  08/26/17 106 lb (48.1 kg)  08/22/17 106 lb (48.1 kg)   Lab Results  Component Value Date   CREATININE 0.87 08/27/2017   CREATININE 0.76 05/20/2017   CREATININE 0.68 05/13/2017    Physical Exam  Constitutional: She is oriented to person, place, and time. She appears well-developed and well-nourished.  HENT:  Head:  Normocephalic and atraumatic.  Neck: Normal range of motion. Neck supple. No JVD present.  Cardiovascular: Normal rate and regular rhythm.  Pulmonary/Chest: Effort normal. She has no wheezes. She has no rales.  Abdominal: Soft. She exhibits no distension. There is no tenderness.  Musculoskeletal: She exhibits no edema or tenderness.  Neurological: She is alert and oriented to person, place, and time.  Skin: Skin is warm and dry.  Psychiatric: She has a normal mood and affect. Her behavior is normal. Thought content normal.  Nursing note and vitals reviewed.   Assessment & Plan:  1: Chronic heart failure with reduced ejection fraction- - NYHA class II - euvolemic today - weighing daily. Reminded to call for an overnight weight gain of >2 pounds or a weekly weight gain of >5 pounds - weight up 2 pounds from 08/22/17 - not adding salt to her food and she was reminded to keep daily sodium intake to '2000mg'$ /day - scheduled for echocardiogram 09/10/17 - saw cardiology Rockey Situ) 08/22/17  2: HTN- - BP mildly elevated today but she hasn't taken any of her medications yet today; she will take everything when she gets home - BMP from 08/27/17 reviewed and  shows sodium 141, potassium 4.4 and GFR 88 - went to [facility] 08/21/17   3: Diabetes- - fasting glucose last week was low 100's; checks it ~ twice a week - currently not on any medications for this  Patient did not bring her medications nor a list. Each medication was verbally reviewed with the patient and she was encouraged to bring the bottles to every visit to confirm accuracy of list.  Return in 6 months or sooner for any questions/problems before then.

## 2017-09-03 ENCOUNTER — Ambulatory Visit: Payer: Medicaid Other | Attending: Family | Admitting: Family

## 2017-09-03 ENCOUNTER — Encounter: Payer: Self-pay | Admitting: Family

## 2017-09-03 VITALS — BP 132/95 | HR 87 | Resp 18 | Ht 59.0 in | Wt 108.0 lb

## 2017-09-03 DIAGNOSIS — Z79899 Other long term (current) drug therapy: Secondary | ICD-10-CM | POA: Insufficient documentation

## 2017-09-03 DIAGNOSIS — K219 Gastro-esophageal reflux disease without esophagitis: Secondary | ICD-10-CM | POA: Insufficient documentation

## 2017-09-03 DIAGNOSIS — I110 Hypertensive heart disease with heart failure: Secondary | ICD-10-CM | POA: Insufficient documentation

## 2017-09-03 DIAGNOSIS — Z7982 Long term (current) use of aspirin: Secondary | ICD-10-CM | POA: Insufficient documentation

## 2017-09-03 DIAGNOSIS — I2510 Atherosclerotic heart disease of native coronary artery without angina pectoris: Secondary | ICD-10-CM | POA: Insufficient documentation

## 2017-09-03 DIAGNOSIS — I10 Essential (primary) hypertension: Secondary | ICD-10-CM

## 2017-09-03 DIAGNOSIS — I428 Other cardiomyopathies: Secondary | ICD-10-CM | POA: Insufficient documentation

## 2017-09-03 DIAGNOSIS — E785 Hyperlipidemia, unspecified: Secondary | ICD-10-CM | POA: Insufficient documentation

## 2017-09-03 DIAGNOSIS — I5042 Chronic combined systolic (congestive) and diastolic (congestive) heart failure: Secondary | ICD-10-CM | POA: Insufficient documentation

## 2017-09-03 DIAGNOSIS — I5022 Chronic systolic (congestive) heart failure: Secondary | ICD-10-CM

## 2017-09-03 DIAGNOSIS — E119 Type 2 diabetes mellitus without complications: Secondary | ICD-10-CM

## 2017-09-03 NOTE — Patient Instructions (Signed)
Continue weighing daily and call for an overnight weight gain of > 2 pounds or a weekly weight gain of >5 pounds. 

## 2017-09-10 ENCOUNTER — Other Ambulatory Visit: Payer: Self-pay

## 2017-09-17 ENCOUNTER — Other Ambulatory Visit: Payer: Self-pay

## 2017-09-21 IMAGING — CT CT ABD-PELV W/ CM
2 of 5 series · 15 of 46 positions shown, 17 images · IV contrast (APPLIED)
Comparison: Abdominal ultrasound dated 03/20/2007

CLINICAL DATA: 50-year-old female with diarrhea and weight loss.

EXAM:
CT ABDOMEN AND PELVIS WITH CONTRAST
TECHNIQUE: Multidetector CT imaging of the abdomen and pelvis was performed
using the standard protocol following bolus administration of
intravenous contrast.
CONTRAST:  75mL 0OUOIZ-GAA IOPAMIDOL (0OUOIZ-GAA) INJECTION 61%

[Series 2: axial st · axial · 0.63mm/px · z∈[-896,-536]mm · 12 of 82 slices shown, 14 images]
[im 5/82  soft-tissue]
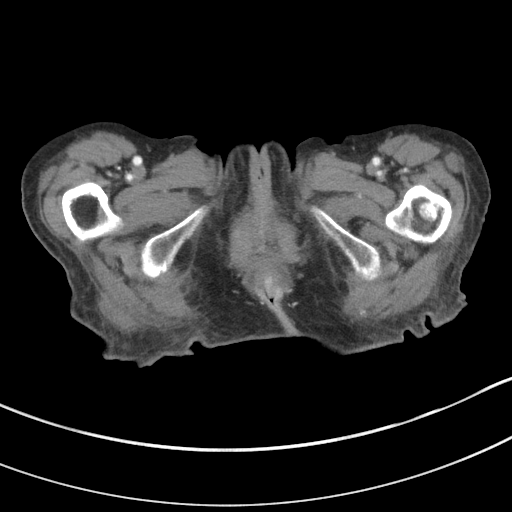
[im 5/82  bone]
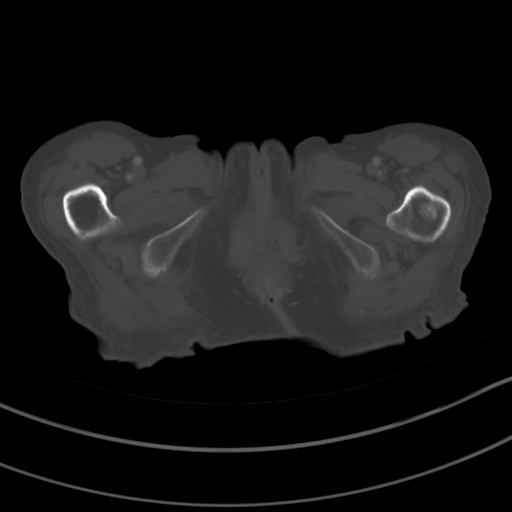
[im 13/82  soft-tissue]
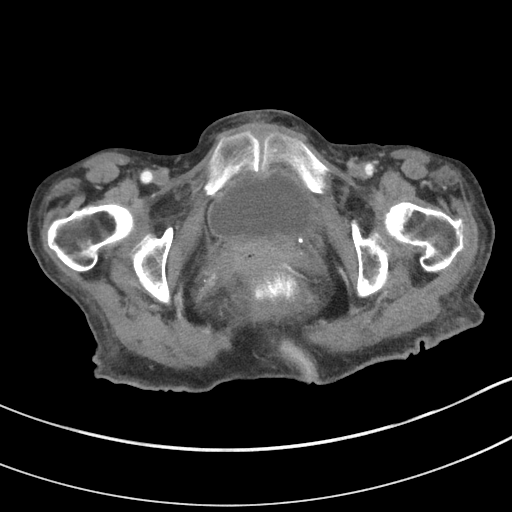
[im 18/82  soft-tissue]
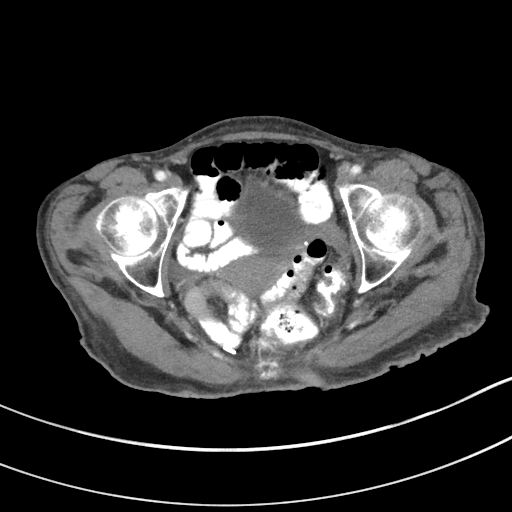
[im 26/82  soft-tissue]
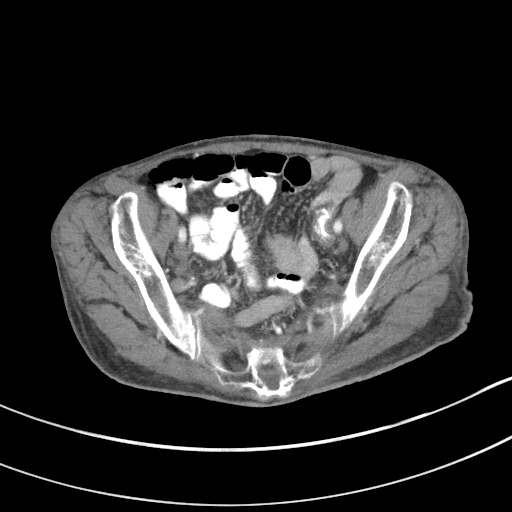
[im 30/82  soft-tissue]
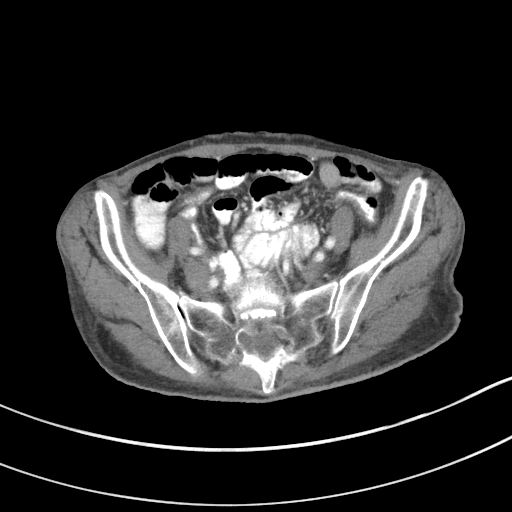
[im 39/82  soft-tissue]
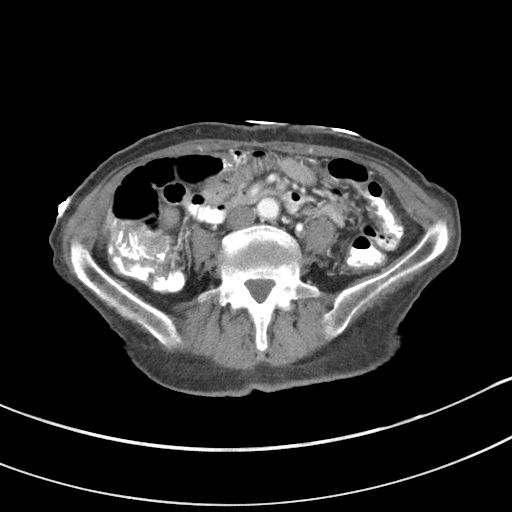
[im 43/82  soft-tissue]
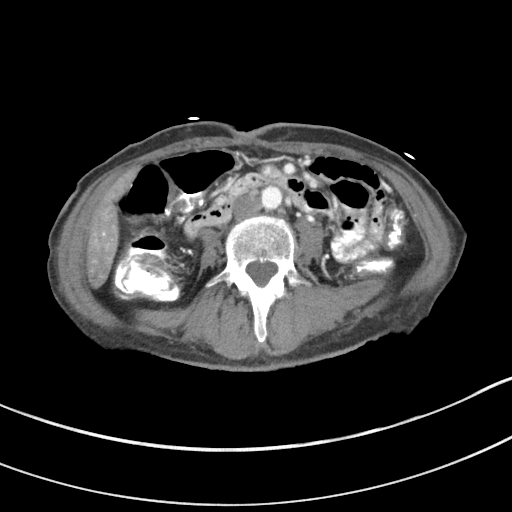
[im 52/82  soft-tissue]
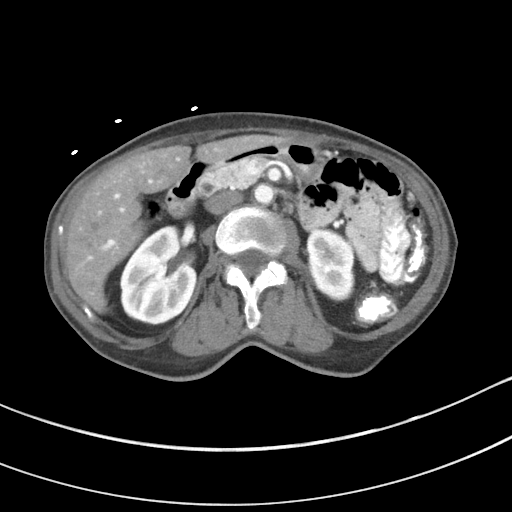
[im 56/82  soft-tissue]
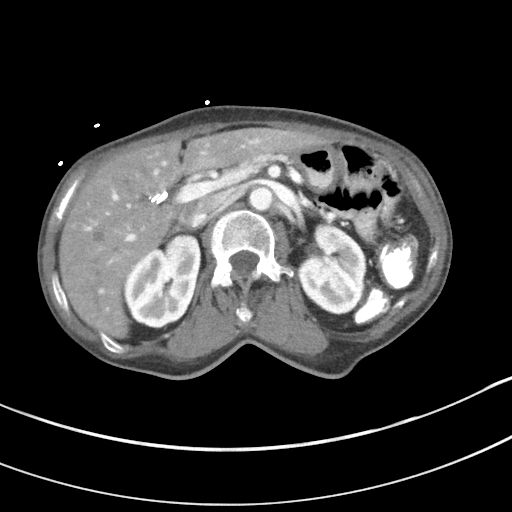
[im 56/82  bone]
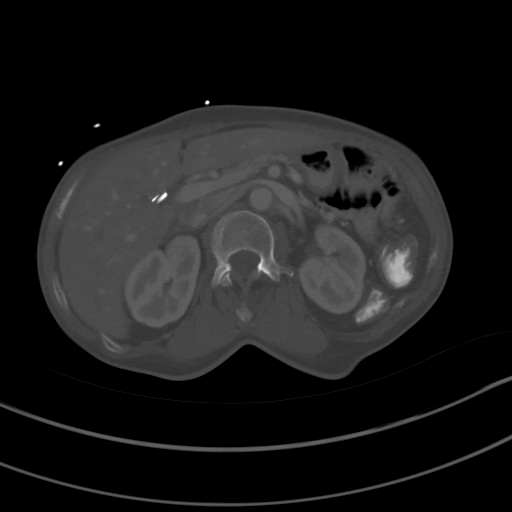
[im 64/82  soft-tissue]
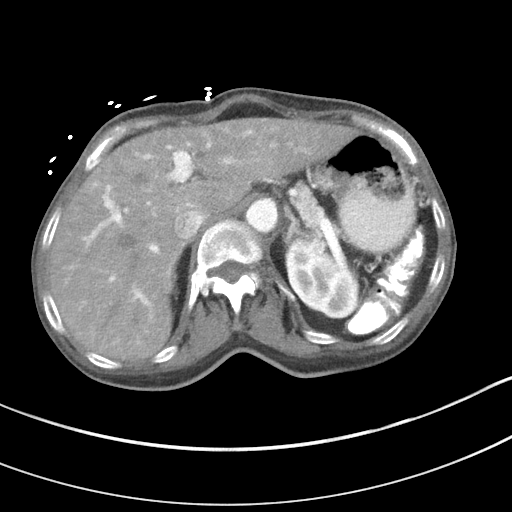
[im 69/82  soft-tissue]
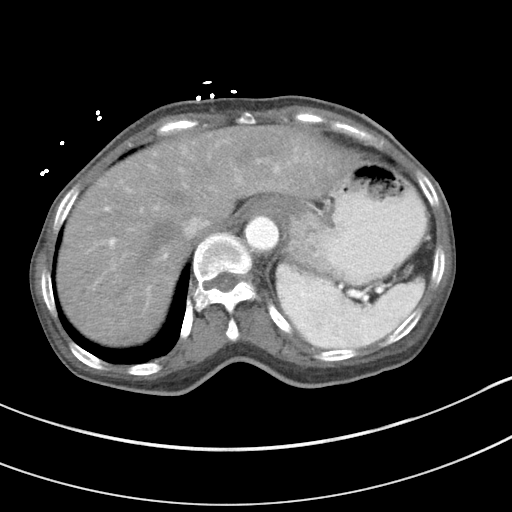
[im 77/82  soft-tissue]
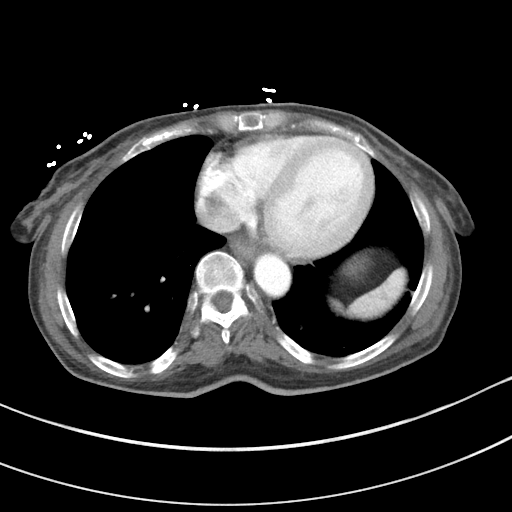

[Series 5: coronal st · coronal · 0.60mm/px · 3 of 63 slices shown]
[im 21/63  soft-tissue]
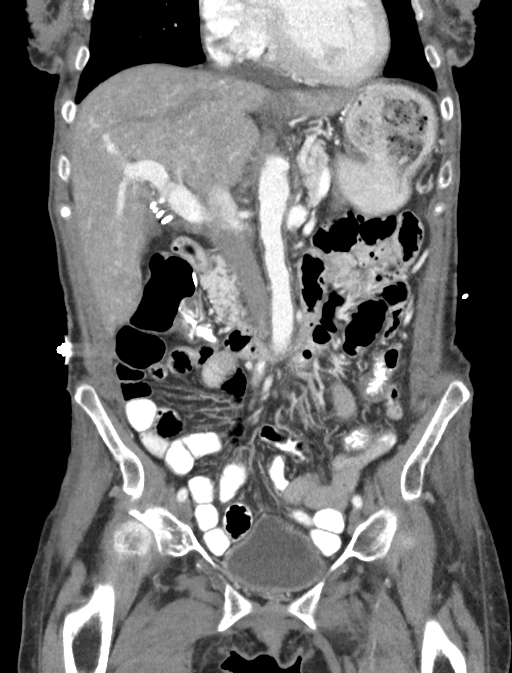
[im 28/63  soft-tissue]
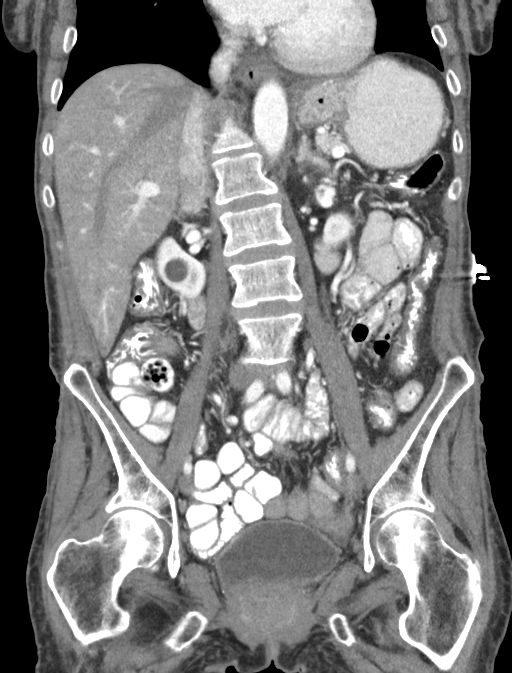
[im 35/63  soft-tissue]
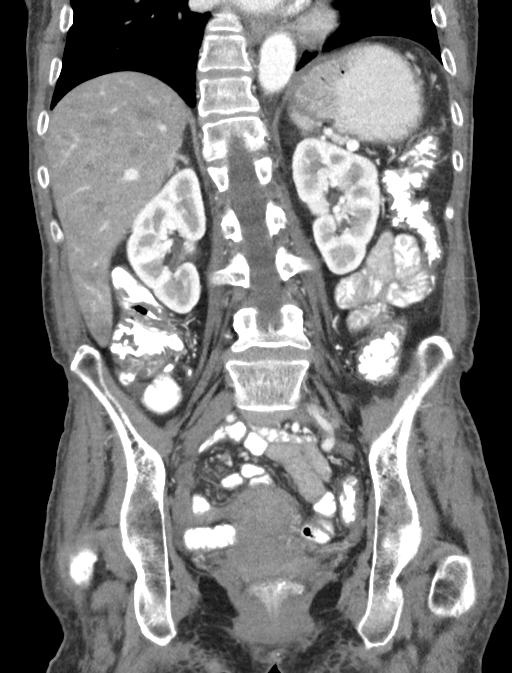

[15 of 46 positions shown; findings below may reference images not displayed]

FINDINGS: Lower chest: The visualized lung bases are clear.

No intra-abdominal free air or free fluid.

Hepatobiliary: The liver is unremarkable. No intrahepatic biliary
ductal dilatation. The gallbladder is surgically absent.

Pancreas: Unremarkable. No pancreatic ductal dilatation or
surrounding inflammatory changes.

Spleen: Normal in size without focal abnormality.

Adrenals/Urinary Tract: The adrenal glands are unremarkable. There
is a 13 mm right renal inferior pole cyst. There is symmetric uptake
and excretion of contrast by kidneys bilaterally. There is no
hydronephrosis on either side. The visualized ureters and urinary
bladder appear unremarkable.

Stomach/Bowel: There is thickened appearance of the colonic wall
concerning for colitis. Correlation with clinical exam and stool
cultures recommended. There is no evidence of bowel obstruction.
Normal appendix.

Vascular/Lymphatic: There is mild uniform thickened appearance of
the wall of the infrarenal abdominal aorta which may represent
noncalcified plaque or sequela of chronic inflammation/ aortitis.
Clinical correlation is recommended. There is no aneurysmal
dilatation or evidence of dissection. The origins of the celiac
axis, SMA, IMA appear patent. The SMV, splenic vein, and main portal
vein are patent. No portal venous gas identified. There is no
adenopathy.

Reproductive: The uterus and ovaries are grossly unremarkable. No
pelvic mass.

Other: None

Musculoskeletal: Osteopenia.  No acute osseous pathology.
IMPRESSION: 1. Thickened appearance of the colonic wall most consistent with
colitis. Correlation with clinical exam and stool cultures
recommended. No bowel obstruction. Normal appendix.
2. Thickened appearance of the wall of the infrarenal aorta likely
noncalcified plaque versus less likely sequela of chronic
inflammation. No aneurysmal dilatation or dissection.

## 2017-09-25 ENCOUNTER — Other Ambulatory Visit: Payer: Self-pay

## 2017-09-25 ENCOUNTER — Ambulatory Visit (INDEPENDENT_AMBULATORY_CARE_PROVIDER_SITE_OTHER): Payer: Self-pay

## 2017-09-25 DIAGNOSIS — I428 Other cardiomyopathies: Secondary | ICD-10-CM

## 2017-10-01 ENCOUNTER — Telehealth: Payer: Self-pay | Admitting: Cardiovascular Disease

## 2017-10-01 DIAGNOSIS — Z79899 Other long term (current) drug therapy: Secondary | ICD-10-CM

## 2017-10-01 DIAGNOSIS — I5022 Chronic systolic (congestive) heart failure: Secondary | ICD-10-CM

## 2017-10-01 MED ORDER — SACUBITRIL-VALSARTAN 49-51 MG PO TABS: 1.0000 | ORAL_TABLET | Freq: Two times a day (BID) | ORAL | 6 refills | Status: DC

## 2017-10-01 NOTE — Telephone Encounter (Signed)
I spoke with the patient today regarding her most recent echo results. She is aware of Dr. Donivan Scull recommendations to increase her entresto to 49/51 mg BID and repeat a BMP in 1 month. The patient is agreeable with the above plan. She will report to the [place] for a repeat BMP the last week of June.   Lab order placed. She wants her new RX sent to [facility].

## 2017-10-15 ENCOUNTER — Telehealth: Payer: Self-pay | Admitting: Cardiovascular Disease

## 2017-10-15 NOTE — Telephone Encounter (Signed)
Received records request Disability Determination Services, forwarded to [employer] for processing.  

## 2017-10-23 ENCOUNTER — Emergency Department: Payer: Self-pay

## 2017-10-23 ENCOUNTER — Emergency Department
Admission: EM | Admit: 2017-10-23 | Discharge: 2017-10-23 | Disposition: A | Discharge status: Home / Self Care | Payer: Self-pay | Attending: Student in an Organized Health Care Education/Training Program | Admitting: Student in an Organized Health Care Education/Training Program

## 2017-10-23 ENCOUNTER — Other Ambulatory Visit: Payer: Self-pay

## 2017-10-23 DIAGNOSIS — E119 Type 2 diabetes mellitus without complications: Secondary | ICD-10-CM | POA: Insufficient documentation

## 2017-10-23 DIAGNOSIS — Z79899 Other long term (current) drug therapy: Secondary | ICD-10-CM | POA: Insufficient documentation

## 2017-10-23 DIAGNOSIS — R197 Diarrhea, unspecified: Secondary | ICD-10-CM

## 2017-10-23 DIAGNOSIS — I10 Essential (primary) hypertension: Secondary | ICD-10-CM | POA: Insufficient documentation

## 2017-10-23 DIAGNOSIS — N3000 Acute cystitis without hematuria: Secondary | ICD-10-CM

## 2017-10-23 DIAGNOSIS — I5042 Chronic combined systolic (congestive) and diastolic (congestive) heart failure: Secondary | ICD-10-CM | POA: Insufficient documentation

## 2017-10-23 DIAGNOSIS — R1013 Epigastric pain: Secondary | ICD-10-CM | POA: Insufficient documentation

## 2017-10-23 DIAGNOSIS — R112 Nausea with vomiting, unspecified: Secondary | ICD-10-CM | POA: Insufficient documentation

## 2017-10-23 LAB — COMPREHENSIVE METABOLIC PANEL
ALBUMIN: 3.7 g/dL (ref 3.5–5.0)
ALT: 78 U/L — ABNORMAL HIGH (ref 14–54)
ANION GAP: 12 (ref 5–15)
AST: 24 U/L (ref 15–41)
Alkaline Phosphatase: 157 U/L — ABNORMAL HIGH (ref 38–126)
BILIRUBIN TOTAL: 1.1 mg/dL (ref 0.3–1.2)
BUN: 16 mg/dL (ref 6–20)
CO2: 23 mmol/L (ref 22–32)
Calcium: 8.9 mg/dL (ref 8.9–10.3)
Chloride: 103 mmol/L (ref 101–111)
Creatinine, Ser: 0.84 mg/dL (ref 0.44–1.00)
GFR calc Af Amer: >60 mL/min (ref >60)
GLUCOSE: 169 mg/dL — AB (ref 65–99)
Potassium: 2.9 mmol/L — ABNORMAL LOW (ref 3.5–5.1)
Sodium: 138 mmol/L (ref 135–145)
TOTAL PROTEIN: 7.3 g/dL (ref 6.5–8.1)

## 2017-10-23 LAB — TROPONIN I

## 2017-10-23 LAB — URINALYSIS, COMPLETE (UACMP) WITH MICROSCOPIC
Bilirubin Urine: NEGATIVE
Glucose, UA: NEGATIVE mg/dL
Hgb urine dipstick: NEGATIVE
Ketones, ur: NEGATIVE mg/dL
NITRITE: NEGATIVE
Protein, ur: 30 mg/dL — AB
SPECIFIC GRAVITY, URINE: 1.016 (ref 1.005–1.030)
WBC, UA: >50 WBC/hpf — ABNORMAL HIGH (ref 0–5)
pH: 6 (ref 5.0–8.0)

## 2017-10-23 LAB — CBC
HCT: 37.8 % (ref 35.0–47.0)
HEMOGLOBIN: 12.7 g/dL (ref 12.0–16.0)
MCH: 28.7 pg (ref 26.0–34.0)
MCHC: 33.5 g/dL (ref 32.0–36.0)
MCV: 85.7 fL (ref 80.0–100.0)
Platelets: 284 10*3/uL (ref 150–440)
RBC: 4.41 MIL/uL (ref 3.80–5.20)
RDW: 12.4 % (ref 11.5–14.5)
WBC: 10.4 10*3/uL (ref 3.6–11.0)

## 2017-10-23 LAB — LIPASE, BLOOD: Lipase: 24 U/L (ref 11–51)

## 2017-10-23 MED ORDER — SODIUM CHLORIDE 0.9 % IV BOLUS: 250.0000 mL | Freq: Once | INTRAVENOUS | Status: DC

## 2017-10-23 MED ORDER — CEPHALEXIN 500 MG PO CAPS
ORAL_CAPSULE | ORAL | Status: AC
  Filled 2017-10-23: qty 1

## 2017-10-23 MED ORDER — CEPHALEXIN 500 MG PO CAPS
500.0000 mg | ORAL_CAPSULE | Freq: Once | ORAL | Status: AC
  Administered 2017-10-23: 500 mg via ORAL

## 2017-10-23 MED ORDER — CEPHALEXIN 500 MG PO CAPS: 500.0000 mg | ORAL_CAPSULE | Freq: Three times a day (TID) | ORAL | 0 refills | Status: AC

## 2017-10-23 MED ORDER — ONDANSETRON 4 MG PO TBDP: 4.0000 mg | ORAL_TABLET | Freq: Three times a day (TID) | ORAL | 0 refills | Status: DC | PRN

## 2017-10-23 MED ORDER — ONDANSETRON 4 MG PO TBDP
4.0000 mg | ORAL_TABLET | Freq: Once | ORAL | Status: AC
  Administered 2017-10-23: 4 mg via ORAL

## 2017-10-23 MED ORDER — METOCLOPRAMIDE HCL 5 MG/ML IJ SOLN
10.0000 mg | Freq: Once | INTRAMUSCULAR | Status: DC
Start: 2017-10-23 — End: 2017-10-23
  Filled 2017-10-23: qty 2

## 2017-10-23 MED ORDER — ONDANSETRON 4 MG PO TBDP
ORAL_TABLET | ORAL | Status: AC
  Filled 2017-10-23: qty 1

## 2017-10-23 NOTE — ED Notes (Signed)
Gave pt some Sprite and peanut butter cracker, told her to let us know if she is unable to keep it down.

## 2017-10-23 NOTE — ED Notes (Addendum)
Patient transported to x-ray. ?

## 2017-10-23 NOTE — ED Triage Notes (Signed)
Pt reports that she developed N/V/D and abd pain three days ago.

## 2017-10-23 NOTE — Discharge Instructions (Signed)

## 2017-10-23 NOTE — ED Notes (Signed)
2 failed attempts with a 24g IV. Spoke with EDP for oral antiemetic and PO fluids.[name]

## 2017-10-23 NOTE — ED Provider Notes (Addendum)
 [facility] Emergency Department Provider Note    First MD Initiated Contact with Patient 10/23/17 1356     (approximate)  I have reviewed the triage vital signs and the nursing notes.   HISTORY  Chief Complaint Abdominal Pain; Emesis; Nausea; and Diarrhea    HPI Neelie L Rubert is a 52 y.o. female of heart failure as well as gallstones presents to the ER chief complaint of 2 to 3 days of progressively worsening nausea vomiting and watery diarrhea.  Denies any fevers at home.  No chest pain or shortness of breath.  States it feels similar to previous episodes of gallstones.  Has not had any intra-abdominal surgeries.  Does have a history of heart failure but states that she is been compliant with her medications and feels that her water volume is "really good "right now.  States that some the pain is migrating through to her back.  Describes it is mild.  Worse with eating.    Past Medical History:  Diagnosis Date  . Chronic combined systolic (congestive) and diastolic (congestive) heart failure (HCC)    a. 03/2017 Echo: EF 20-25%, diff HK, Gr2 DD.  . Diabetes (HCC)   . GERD (gastroesophageal reflux disease)   . HLD (hyperlipidemia)   . Hypertension   . NICM (nonischemic cardiomyopathy) (HCC)    a. 03/2017 Echo: EF 20-25%, diff HK, Gr2 DD, mild to mod MR, nl RV fxn;  b. 03/2017 Cath: mild nonobs dzs, EF 25%.  . Pleural effusion, left    a. 03/2017 s/p thoracentesis-->300 ml withdrawn--transudative.  . Pneumonia 02/19/2017   Family History  Problem Relation Age of Onset  . Lung cancer Mother   . Hypertension Father   . CAD Father        a. MI age 70  . COPD Neg Hx   . Diabetes Mellitus II Neg Hx    Past Surgical History:  Procedure Laterality Date  . COLONOSCOPY WITH PROPOFOL N/A 01/21/2017   Procedure: COLONOSCOPY WITH PROPOFOL;  Surgeon: Anna, Kiran, MD;  Location: [facility] ENDOSCOPY;  Service: Gastroenterology;  Laterality: N/A;  .  ESOPHAGOGASTRODUODENOSCOPY (EGD) WITH PROPOFOL N/A 01/21/2017   Procedure: ESOPHAGOGASTRODUODENOSCOPY (EGD) WITH PROPOFOL;  Surgeon: Anna, Kiran, MD;  Location: [facility] ENDOSCOPY;  Service: Gastroenterology;  Laterality: N/A;  . FLEXIBLE SIGMOIDOSCOPY N/A 11/04/2016   Procedure: FLEXIBLE SIGMOIDOSCOPY;  Surgeon: Schooler, Vincent, MD;  Location: [facility] ENDOSCOPY;  Service: Endoscopy;  Laterality: N/A;  . LEFT HEART CATH AND CORONARY ANGIOGRAPHY N/A 04/05/2017   Procedure: LEFT HEART CATH AND CORONARY ANGIOGRAPHY;  Surgeon: Arida, Muhammad A, MD;  Location: [facility] INVASIVE CV LAB;  Service: Cardiovascular;  Laterality: N/A;  . LITHOTRIPSY     Patient Active Problem List   Diagnosis Date Noted  . NICM (nonischemic cardiomyopathy) (HCC) 08/22/2017  . CHF (congestive heart failure) (HCC) 04/03/2017  . Protein-calorie malnutrition, severe 11/02/2016  . Hypokalemia 04/28/2013  . Leukocytosis, unspecified 04/28/2013  . Diabetes (HCC)   . Hypertension   . Hypercholesterolemia 11/10/2012      Prior to Admission medications   Medication Sig Start Date End Date Taking? Authorizing Provider  albuterol (PROVENTIL HFA;VENTOLIN HFA) 108 (90 Base) MCG/ACT inhaler Inhale 2 puffs into the lungs every 4 (four) hours as needed for wheezing or shortness of breath. 04/26/17   Hackney, Tina A, FNP  aspirin 81 MG tablet Take 81 mg by mouth daily.    [provider]  Blood Glucose Monitoring Suppl (FIFTY50 GLUCOSE METER 2.0) w/Device KIT Frequency:PHARMDIR     Dosage:0.0     Instructions:  Note:use as directed by [place] (up to four times daily) Dose: 1 LANCET 05/13/13   [provider]  carvedilol (COREG) 3.125 MG tablet Take 1 tablet (3.125 mg total) by mouth 2 (two) times daily with a meal. 08/22/17   Gollan, Kathlene November, MD  furosemide (LASIX) 20 MG tablet Take 1 tablet (20 mg total) by mouth daily. 07/08/17   Darylene Price A, FNP  glucose blood (BAYER CONTOUR TEST) test strip by Other route Two  (2) times a day. 05/13/13   [provider]  loperamide (IMODIUM) 2 MG capsule Take 2 capsules (4 mg total) by mouth as needed for diarrhea or loose stools. 11/06/16   Dustin Flock, MD  ondansetron (ZOFRAN ODT) 4 MG disintegrating tablet Take 1 tablet (4 mg total) by mouth every 8 (eight) hours as needed for nausea or vomiting. 10/23/17   Merlyn Lot, MD  sacubitril-valsartan (ENTRESTO) 49-51 MG Take 1 tablet by mouth 2 (two) times daily. 10/01/17   Minna Merritts, MD  spironolactone (ALDACTONE) 25 MG tablet Take 0.5 tablets (12.5 mg total) by mouth daily. 06/06/17   Alisa Graff, FNP    Allergies Patient has no known allergies.    Social History Social History   Tobacco Use  . Smoking status: Never Smoker  . Smokeless tobacco: Never Used  Substance Use Topics  . Alcohol use: No  . Drug use: No    Review of Systems Patient denies headaches, rhinorrhea, blurry vision, numbness, shortness of breath, chest pain, edema, cough, abdominal pain, nausea, vomiting, diarrhea, dysuria, fevers, rashes or hallucinations unless otherwise stated above in HPI. ____________________________________________   PHYSICAL EXAM:  VITAL SIGNS: Vitals:   10/23/17 1317  BP: 112/83  Pulse: (!) 113  Resp: 20  Temp: 99.5 F (37.5 C)  SpO2: 94%    Constitutional: Alert and oriented.  Eyes: Conjunctivae are normal.  Head: Atraumatic. Nose: No congestion/rhinnorhea. Mouth/Throat: Mucous membranes are moist.   Neck: No stridor. Painless ROM.  Cardiovascular: Normal rate, regular rhythm. Grossly normal heart sounds.  Good peripheral circulation. Respiratory: Normal respiratory effort.  No retractions. Lungs CTAB. Gastrointestinal: Soft and nontender. No distention. No abdominal bruits. No CVA tenderness. Genitourinary: deferred Musculoskeletal: No lower extremity tenderness nor edema.  No joint effusions. Neurologic:  Normal speech and language. No gross focal neurologic deficits  are appreciated. No facial droop Skin:  Skin is warm, dry and intact. No rash noted. Psychiatric: Mood and affect are normal. Speech and behavior are normal.  ____________________________________________   LABS (all labs ordered are listed, but only abnormal results are displayed)  Results for orders placed or performed during the hospital encounter of 10/23/17 (from the past 24 hour(s))  Lipase, blood     Status: None   Collection Time: 10/23/17  1:21 PM  Result Value Ref Range   Lipase 24 11 - 51 U/L  Comprehensive metabolic panel     Status: Abnormal   Collection Time: 10/23/17  1:21 PM  Result Value Ref Range   Sodium 138 135 - 145 mmol/L   Potassium 2.9 (L) 3.5 - 5.1 mmol/L   Chloride 103 101 - 111 mmol/L   CO2 23 22 - 32 mmol/L   Glucose, Bld 169 (H) 65 - 99 mg/dL   BUN 16 6 - 20 mg/dL   Creatinine, Ser 0.84 0.44 - 1.00 mg/dL   Calcium 8.9 8.9 - 10.3 mg/dL   Total Protein 7.3 6.5 - 8.1 g/dL  Albumin 3.7 3.5 - 5.0 g/dL   AST 24 15 - 41 U/L   ALT 78 (H) 14 - 54 U/L   Alkaline Phosphatase 157 (H) 38 - 126 U/L   Total Bilirubin 1.1 0.3 - 1.2 mg/dL   GFR calc non Af Amer >60 >60 mL/min   GFR calc Af Amer >60 >60 mL/min   Anion gap 12 5 - 15  CBC     Status: None   Collection Time: 10/23/17  1:21 PM  Result Value Ref Range   WBC 10.4 3.6 - 11.0 K/uL   RBC 4.41 3.80 - 5.20 MIL/uL   Hemoglobin 12.7 12.0 - 16.0 g/dL   HCT 37.8 35.0 - 47.0 %   MCV 85.7 80.0 - 100.0 fL   MCH 28.7 26.0 - 34.0 pg   MCHC 33.5 32.0 - 36.0 g/dL   RDW 12.4 11.5 - 14.5 %   Platelets 284 150 - 440 K/uL  Urinalysis, Complete w Microscopic     Status: Abnormal   Collection Time: 10/23/17  1:21 PM  Result Value Ref Range   Color, Urine AMBER (A) YELLOW   APPearance CLOUDY (A) CLEAR   Specific Gravity, Urine 1.016 1.005 - 1.030   pH 6.0 5.0 - 8.0   Glucose, UA NEGATIVE NEGATIVE mg/dL   Hgb urine dipstick NEGATIVE NEGATIVE   Bilirubin Urine NEGATIVE NEGATIVE   Ketones, ur NEGATIVE NEGATIVE  mg/dL   Protein, ur 30 (A) NEGATIVE mg/dL   Nitrite NEGATIVE NEGATIVE   Leukocytes, UA LARGE (A) NEGATIVE   RBC / HPF 6-10 0 - 5 RBC/hpf   WBC, UA >50 (H) 0 - 5 WBC/hpf   Bacteria, UA MANY (A) NONE SEEN   Squamous Epithelial / LPF 0-5 0 - 5   WBC Clumps PRESENT    Mucus PRESENT    Hyaline Casts, UA PRESENT    Non Squamous Epithelial 0-5 (A) NONE SEEN  Troponin I     Status: None   Collection Time: 10/23/17  2:29 PM  Result Value Ref Range   Troponin I <0.03 <0.03 ng/mL   ____________________________________________  EKG My review and personal interpretation at Time: 14:49   Indication: epigastric discomfort  Rate: 90  Rhythm: sinus Axis: left Other: lbbb, nonspecific st abn, no stemi, unchanged pattern as compared to tracing 08/22/17 ____________________________________________  RADIOLOGY  I personally reviewed all radiographic images ordered to evaluate for the above acute complaints and reviewed radiology reports and findings.  These findings were personally discussed with the patient.  Please see medical record for radiology report.  ____________________________________________   PROCEDURES  Procedure(s) performed:  Procedures    Critical Care performed: no ____________________________________________   INITIAL IMPRESSION / ASSESSMENT AND PLAN / ED COURSE  Pertinent labs & imaging results that were available during my care of the patient were reviewed by me and considered in my medical decision making (see chart for details).   DDX: cholelithiasis, cholecystitis,. Pancreatitis, enteritis, effusion, pna, acs  GLENNYS SCHORSCH is a 52 y.o. who presents to the ED with symptoms as described above.  Patient afebrile Heema dynamically stable.  Borderline tachycardia in triage that resolved on arrival to the ER room.  She denies any chest pain or shortness of breath.  EKG is consistent with previous and her troponin is negative after 3 days of symptoms.  Chest x-ray shows no  evidence of pneumonia or pulmonary edema.  Patient stated that she had cholelithiasis states that she still has the gallbladder therefore a right upper  quadrant ultrasound will be ordered as she does have mildly elevated alk phos and ALT.  Her abdominal exam is otherwise soft benign.  Possible enteritis therefore will give antiemetic and reassess.  Clinical Course as of Oct 24 1654  Wed Oct 23, 2017  1615 Pete abdominal exam soft and benign.  Patient feeling much improved after Zofran.  Ultrasound shows cholecystectomy despite patient stating that she still had her gallbladder.  Denies any chest pain or shortness of breath.  Feels ready to go home.  Discussed need for p.o. trial to ensure the patient is able to take her home medications and then well discharge home as she is able.   [PR]    Clinical Course User Index [PR] Merlyn Lot, MD     As part of my medical decision making, I reviewed the following data within the [facility] notes reviewed and incorporated, Labs reviewed, notes from prior ED visits.   ____________________________________________   FINAL CLINICAL IMPRESSION(S) / ED DIAGNOSES  Final diagnoses:  Epigastric abdominal pain  Nausea vomiting and diarrhea  Acute cystitis without hematuria      NEW MEDICATIONS STARTED DURING THIS VISIT:  New Prescriptions   ONDANSETRON (ZOFRAN ODT) 4 MG DISINTEGRATING TABLET    Take 1 tablet (4 mg total) by mouth every 8 (eight) hours as needed for nausea or vomiting.     Note:  This document was prepared using Dragon voice recognition software and may include unintentional dictation errors.    Merlyn Lot, MD 10/23/17 1621    Merlyn Lot, MD 10/23/17 [phone]

## 2017-11-11 ENCOUNTER — Encounter: Payer: Self-pay | Admitting: Gastroenterology

## 2017-11-11 ENCOUNTER — Ambulatory Visit: Payer: Medicaid Other | Admitting: Gastroenterology

## 2017-11-11 ENCOUNTER — Other Ambulatory Visit
Admission: RE | Admit: 2017-11-11 | Discharge: 2017-11-11 | Disposition: A | Discharge status: Home / Self Care | Payer: Medicaid Other | Source: Ambulatory Visit | Attending: Gastroenterology | Admitting: Gastroenterology

## 2017-11-11 VITALS — BP 135/91 | HR 94 | Ht 59.0 in | Wt 105.2 lb

## 2017-11-11 DIAGNOSIS — R7989 Other specified abnormal findings of blood chemistry: Secondary | ICD-10-CM

## 2017-11-11 DIAGNOSIS — R197 Diarrhea, unspecified: Secondary | ICD-10-CM

## 2017-11-11 DIAGNOSIS — R945 Abnormal results of liver function studies: Secondary | ICD-10-CM

## 2017-11-11 LAB — HEPATIC FUNCTION PANEL
ALBUMIN: 3.5 g/dL (ref 3.5–5.0)
ALK PHOS: 90 U/L (ref 38–126)
ALT: 19 U/L (ref 0–44)
AST: 20 U/L (ref 15–41)
Bilirubin, Direct: 0.1 mg/dL (ref 0.0–0.2)
Indirect Bilirubin: 0.4 mg/dL (ref 0.3–0.9)
TOTAL PROTEIN: 6.7 g/dL (ref 6.5–8.1)
Total Bilirubin: 0.5 mg/dL (ref 0.3–1.2)

## 2017-11-11 MED ORDER — CHOLESTYRAMINE 4 G PO PACK: 4.0000 g | PACK | Freq: Three times a day (TID) | ORAL | 12 refills | Status: DC

## 2017-11-11 NOTE — Progress Notes (Signed)
Jonathon Bellows MD, MRCP(U.K) [address]  [place]  [place], [place] [address]  Main: [phone]  Fax: [phone]   Primary Care Physician: Staci Acosta, NP  Primary Gastroenterologist:  Dr. Jonathon Bellows   No chief complaint on file.   HPI: Erin Hayes is a 52 y.o. female   Summary of history : She was seen by Dr Michail Sermon on 11/03/16 as an inpatient at [facility] when she presented with chronic diarrhea for 3 months in duration . CT abdomen in 09/2016 showed colon wall thickening . No prior colonoscopy . Hb 14.3 , PLT 354 , very low potassium , AKI on admission. Stool tested negative for c diff and GI PCR. She underwent a flexible sigmoidoscopy during the hospitalization and diffuse colonic edema was seen , random colon biopsies were taken . No abnormalities were seen per pathology report. At her visit on 12/05/16 I found out that she uses splenda 10-15 packets a day , 1-2 diet sodas a week , unsweetended tea with splenda.  01/21/17- Colonoscopy -no mucosal abnormalities. Biopsies were negative for microscopic colitis. She did have a tubular adenoma. EGD with duodenal bx were negative for celiac disease   Interval history 03/07/17-11/11/17  Was in the ER on 10/23/17 with nausea,vomiting and diarrhea of 2 days duration .   RUQ USG showed : S/p cholecystectomy , Alk phos elevated at 157, ALT 78 . CBC - normal . Treated for gastroenteritis and discharged.   Still has diarrhea - 1-2 times a day - not every day - last happended day before . No artificial sugars. Occasional apple juice. CREON did not work . No abdominal pain.     Current Outpatient Medications  Medication Sig Dispense Refill  . albuterol (PROVENTIL HFA;VENTOLIN HFA) 108 (90 Base) MCG/ACT inhaler Inhale 2 puffs into the lungs every 4 (four) hours as needed for wheezing or shortness of breath. 1 Inhaler 3  . aspirin 81 MG tablet Take 81 mg by mouth daily.    . Blood Glucose Monitoring Suppl (FIFTY50 GLUCOSE METER  2.0) w/Device KIT Frequency:PHARMDIR   Dosage:0.0     Instructions:  Note:use as directed by [place] (up to four times daily) Dose: 1 LANCET    . carvedilol (COREG) 3.125 MG tablet Take 1 tablet (3.125 mg total) by mouth 2 (two) times daily with a meal. 180 tablet 4  . furosemide (LASIX) 20 MG tablet Take 1 tablet (20 mg total) by mouth daily. 90 tablet 3  . glucose blood (BAYER CONTOUR TEST) test strip by Other route Two (2) times a day.    . loperamide (IMODIUM) 2 MG capsule Take 2 capsules (4 mg total) by mouth as needed for diarrhea or loose stools. 30 capsule 0  . ondansetron (ZOFRAN ODT) 4 MG disintegrating tablet Take 1 tablet (4 mg total) by mouth every 8 (eight) hours as needed for nausea or vomiting. 20 tablet 0  . sacubitril-valsartan (ENTRESTO) 49-51 MG Take 1 tablet by mouth 2 (two) times daily. 60 tablet 6  . spironolactone (ALDACTONE) 25 MG tablet Take 0.5 tablets (12.5 mg total) by mouth daily. 45 tablet 3   No current facility-administered medications for this visit.     Allergies as of 11/11/2017  . (No Known Allergies)    ROS:  General: Negative for anorexia, weight loss, fever, chills, fatigue, weakness. ENT: Negative for hoarseness, difficulty swallowing , nasal congestion. CV: Negative for chest pain, angina, palpitations, dyspnea on exertion, peripheral edema.  Respiratory: Negative for dyspnea  at rest, dyspnea on exertion, cough, sputum, wheezing.  GI: See history of present illness. GU:  Negative for dysuria, hematuria, urinary incontinence, urinary frequency, nocturnal urination.  Endo: Negative for unusual weight change.    Physical Examination:   There were no vitals taken for this visit.  General: Well-nourished, well-developed in no acute distress.  Eyes: No icterus. Conjunctivae pink. Mouth: Oropharyngeal mucosa moist and pink , no lesions erythema or exudate. Lungs: Clear to auscultation bilaterally. Non-labored. Heart: Regular rate and  rhythm, no murmurs rubs or gallops.  Abdomen: Bowel sounds are normal, nontender, nondistended, no hepatosplenomegaly or masses, no abdominal bruits or hernia , no rebound or guarding.   Extremities: No lower extremity edema. No clubbing or deformities. Neuro: Alert and oriented x 3.  Grossly intact. Skin: Warm and dry, no jaundice.   Psych: Alert and cooperative, normal mood and affect.   Imaging Studies: Dg Chest 2 View  Result Date: 10/23/2017 CLINICAL DATA:  Nausea, vomiting, and diarrhea EXAM: CHEST - 2 VIEW COMPARISON:  CT chest 04/04/2017.  Two-view chest x-ray 04/03/2017. FINDINGS: Heart size normal. Lungs are clear. The visualized soft tissues and bony thorax are unremarkable. IMPRESSION: Negative two view chest x-ray Electronically Signed   By: San Morelle M.D.   On: 10/23/2017 14:47   US Abdomen Limited Ruq  Result Date: 10/23/2017 CLINICAL DATA:  Epigastric abdominal pain for 3 days EXAM: ULTRASOUND ABDOMEN LIMITED RIGHT UPPER QUADRANT COMPARISON:  10/02/2016 abdominal CT FINDINGS: Gallbladder: Surgically absent Common bile duct: Diameter: 3 mm Liver: No focal lesion identified. Within normal limits in parenchymal echogenicity. Portal vein is patent on color Doppler imaging with normal direction of blood flow towards the liver. IMPRESSION: Cholecystectomy. No abnormal finding. Electronically Signed   By: Monte Fantasia M.D.   On: 10/23/2017 15:30    Assessment and Plan:   Erin Hayes is a 52 y.o. y/o female here to follow up for severe . Symptoms are on and off. EGD+colonoscopy were negative. R/o infection and empirically treat for bile salt diarrhea.   Plan  1. Stop all artificial sweeteners , Recheck LFT's - of abnormal will need MRCP + lab work  2. Check stool studies for leucocytes and c diff,PCR 3. Trial of Questran   Dr Jonathon Bellows  MD,MRCP [facility]) Follow up in 6 weeks

## 2017-11-13 ENCOUNTER — Encounter: Payer: Self-pay | Admitting: Gastroenterology

## 2017-11-19 ENCOUNTER — Telehealth: Payer: Self-pay | Admitting: Gastroenterology

## 2017-11-19 NOTE — Telephone Encounter (Signed)
Pt is calling  The rx that was prescripted her pharm,acy does not have she would like to see if she could get another rx or call into [facility] please call pt

## 2017-11-20 ENCOUNTER — Ambulatory Visit: Payer: Medicaid Other | Admitting: Internal Medicine

## 2017-11-20 ENCOUNTER — Other Ambulatory Visit: Payer: Self-pay

## 2017-11-20 DIAGNOSIS — R197 Diarrhea, unspecified: Secondary | ICD-10-CM

## 2017-11-20 MED ORDER — CHOLESTYRAMINE 4 G PO PACK: 4.0000 g | PACK | Freq: Three times a day (TID) | ORAL | 12 refills | Status: DC

## 2017-11-20 NOTE — Telephone Encounter (Signed)
Spoke with pt and medication has been resent to [facility] per pt

## 2017-11-28 ENCOUNTER — Ambulatory Visit: Payer: Medicaid Other | Admitting: Adult Health

## 2017-12-17 ENCOUNTER — Other Ambulatory Visit
Admission: RE | Admit: 2017-12-17 | Discharge: 2017-12-17 | Disposition: A | Discharge status: Home / Self Care | Payer: Medicaid Other | Source: Ambulatory Visit | Attending: Gastroenterology | Admitting: Gastroenterology

## 2017-12-17 DIAGNOSIS — R197 Diarrhea, unspecified: Secondary | ICD-10-CM | POA: Insufficient documentation

## 2017-12-17 LAB — C DIFFICILE QUICK SCREEN W PCR REFLEX
C DIFFICILE (CDIFF) INTERP: NOT DETECTED
C DIFFICILE (CDIFF) TOXIN: NEGATIVE
C DIFFICLE (CDIFF) ANTIGEN: NEGATIVE

## 2017-12-18 ENCOUNTER — Encounter: Payer: Self-pay | Admitting: Gastroenterology

## 2017-12-18 LAB — CALPROTECTIN, FECAL: Calprotectin, Fecal: <16 ug/g (ref 0–120)

## 2018-01-07 ENCOUNTER — Telehealth: Payer: Self-pay | Admitting: Pharmacist

## 2018-01-07 NOTE — Telephone Encounter (Signed)
01/07/2018 1:48:44 PM - Delene Loll refill  01/07/18 Called [facility] for refill, spoke with Gabriel Cirri, she processed and will deliver 01/09/18.Delos Haring

## 2018-01-08 ENCOUNTER — Encounter: Payer: Self-pay | Admitting: Gastroenterology

## 2018-01-08 ENCOUNTER — Ambulatory Visit: Payer: Medicaid Other | Admitting: Gastroenterology

## 2018-01-08 VITALS — BP 129/86 | HR 109 | Ht 59.0 in | Wt 110.6 lb

## 2018-01-08 DIAGNOSIS — R197 Diarrhea, unspecified: Secondary | ICD-10-CM

## 2018-01-08 NOTE — Progress Notes (Signed)
Jonathon Bellows MD, MRCP(U.K) [address]  [place]  [place], [place] [address]  Main: [phone]  Fax: [phone]   Primary Care Physician: Staci Acosta, NP  Primary Gastroenterologist:  Dr. Jonathon Bellows   No chief complaint on file.   HPI: Erin Hayes is a 52 y.o. female    Summary of history : She was seen by Dr Michail Sermon on 11/03/16 as an inpatient at [facility] when she presented with chronic diarrhea for 3 months in duration . CT abdomen in 09/2016 showed colon wall thickening . No prior colonoscopy . Hb 14.3 , PLT 354 , very low potassium , AKI on admission. Stool tested negative for c diff and GI PCR. She underwent a flexible sigmoidoscopy during the hospitalization and diffuse colonic edema was seen , random colon biopsies were taken . No abnormalities were seen per pathology report. At her visit on 12/05/16 I found out that she uses splenda 10-15 packets a day , 1-2 diet sodas a week , unsweetended tea with splenda.  01/21/17- Colonoscopy -no mucosal abnormalities. Biopsies were negative for microscopic colitis. She did have a tubular adenoma. EGD with duodenal bx were negative for celiac disease RUQ USG showed : S/p cholecystectomy , Alk phos elevated at 157, ALT 78 . CBC - normal . Treated for gastroenteritis and discharged.   Interval history 11/11/17-01/08/18   12/15/17- C diff stool-negative, fecal calprotectin negative.   Hepatic Function Latest Ref Rng & Units 11/11/2017 10/23/2017 08/27/2017  Total Protein 6.5 - 8.1 g/dL 6.7 7.3 7.1  Albumin 3.5 - 5.0 g/dL 3.5 3.7 4.3  AST 15 - 41 U/L '20 24 21  '$ ALT 0 - 44 U/L 19 78(H) 21  Alk Phosphatase 38 - 126 U/L 90 157(H) 113  Total Bilirubin 0.3 - 1.2 mg/dL 0.5 1.1 0.4  Bilirubin, Direct 0.0 - 0.2 mg/dL 0.1 - -   Gained 5 lbs . Presently has between 2-many bowel movements, can range from mushy to watery . No gas or bloating , occasional pain relieved with a bowel movement   Was in the ER on 10/23/17 with nausea,vomiting  and diarrhea of 2 days duration .   No artificial sugars.  CREON did not work .  On imodium takes 4 mg a day - helps. Drinks sweet tea and at times makes her diarrhea worse  Current Outpatient Medications  Medication Sig Dispense Refill  . albuterol (PROVENTIL HFA;VENTOLIN HFA) 108 (90 Base) MCG/ACT inhaler Inhale 2 puffs into the lungs every 4 (four) hours as needed for wheezing or shortness of breath. 1 Inhaler 3  . aspirin 81 MG tablet Take 81 mg by mouth daily.    . Blood Glucose Monitoring Suppl (FIFTY50 GLUCOSE METER 2.0) w/Device KIT Frequency:PHARMDIR   Dosage:0.0     Instructions:  Note:use as directed by [place] (up to four times daily) Dose: 1 LANCET    . carvedilol (COREG) 3.125 MG tablet Take 1 tablet (3.125 mg total) by mouth 2 (two) times daily with a meal. 180 tablet 4  . cholestyramine (QUESTRAN) 4 g packet Take 1 packet (4 g total) by mouth 3 (three) times daily with meals. 60 each 12  . furosemide (LASIX) 20 MG tablet Take 1 tablet (20 mg total) by mouth daily. 90 tablet 3  . glucose blood (BAYER CONTOUR TEST) test strip by Other route Two (2) times a day.    . loperamide (IMODIUM) 2 MG capsule Take 2 capsules (4 mg total) by mouth as needed for  diarrhea or loose stools. 30 capsule 0  . ondansetron (ZOFRAN ODT) 4 MG disintegrating tablet Take 1 tablet (4 mg total) by mouth every 8 (eight) hours as needed for nausea or vomiting. 20 tablet 0  . sacubitril-valsartan (ENTRESTO) 49-51 MG Take 1 tablet by mouth 2 (two) times daily. 60 tablet 6  . spironolactone (ALDACTONE) 25 MG tablet Take 0.5 tablets (12.5 mg total) by mouth daily. 45 tablet 3   No current facility-administered medications for this visit.     Allergies as of 01/08/2018  . (No Known Allergies)    ROS:  General: Negative for anorexia, weight loss, fever, chills, fatigue, weakness. ENT: Negative for hoarseness, difficulty swallowing , nasal congestion. CV: Negative for chest pain, angina,  palpitations, dyspnea on exertion, peripheral edema.  Respiratory: Negative for dyspnea at rest, dyspnea on exertion, cough, sputum, wheezing.  GI: See history of present illness. GU:  Negative for dysuria, hematuria, urinary incontinence, urinary frequency, nocturnal urination.  Endo: Negative for unusual weight change.    Physical Examination:   There were no vitals taken for this visit.  General: Well-nourished, well-developed in no acute distress.  Eyes: No icterus. Conjunctivae pink. Mouth: Oropharyngeal mucosa moist and pink , no lesions erythema or exudate. Lungs: Clear to auscultation bilaterally. Non-labored. Heart: Regular rate and rhythm, no murmurs rubs or gallops.  Abdomen: Bowel sounds are normal, nontender, nondistended, no hepatosplenomegaly or masses, no abdominal bruits or hernia , no rebound or guarding.   Extremities: No lower extremity edema. No clubbing or deformities. Neuro: Alert and oriented x 3.  Grossly intact. Skin: Warm and dry, no jaundice.   Psych: Alert and cooperative, normal mood and affect. BP 129/86   Pulse (!) 109   Ht '4\' 11"'$  (1.499 m)   Wt 110 lb 9.6 oz (50.2 kg)   BMI 22.34 kg/m   Imaging Studies: No results found.  Assessment and Plan:   Erin Hayes is a 52 y.o. y/o femalehere to follow up for severe diarrhea . Symptoms are on and off. EGD+colonoscopy were negative.Previously abnormal LFT's have resolved. Presently diarrhea could be from bile salts post cholecystectomy, unfortunately her co pay for [employer] is very high, she cant afford. Other causes are an osmotic diarrhea from sweet tea due to the high fructose corn syrup, advised her to drink unsweetened tea or add cane sugar. She may also have IBS-d which I will empirically treat with a short course of Xifaxan - samples provided, if does not work she will call me in 2 weeks time. A negative fecal calprotectin indicates no inflammation of the GI tract.    Dr Jonathon Bellows  MD,MRCP  [facility]) Follow up PRN

## 2018-02-07 IMAGING — CR DG CHEST 2V
2 series · 2 of 2 positions shown · non-contrast
Comparison: 02/04/2017 chest radiograph

CLINICAL DATA: 51 y/o  F; shortness of breath.

EXAM:
CHEST  2 VIEW

[chest pa]
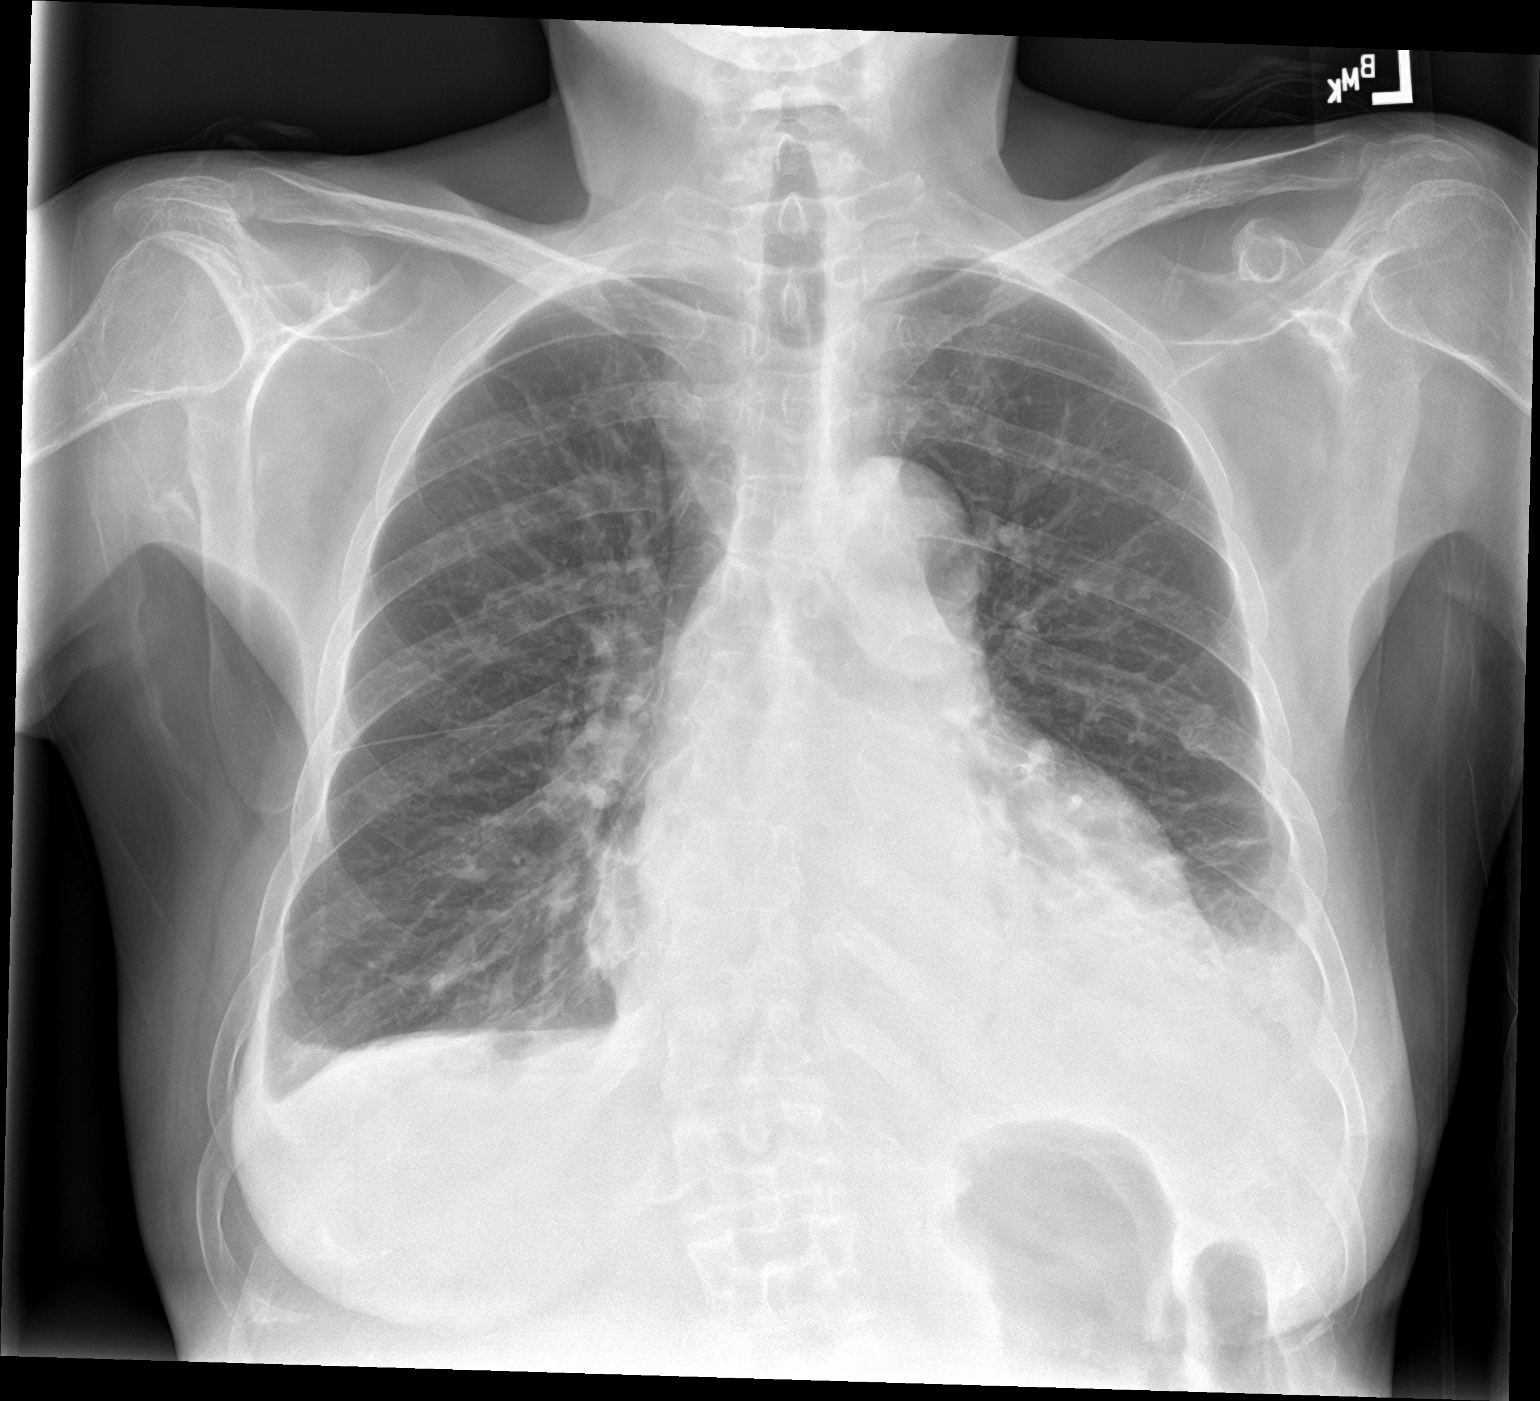

[chest lat]
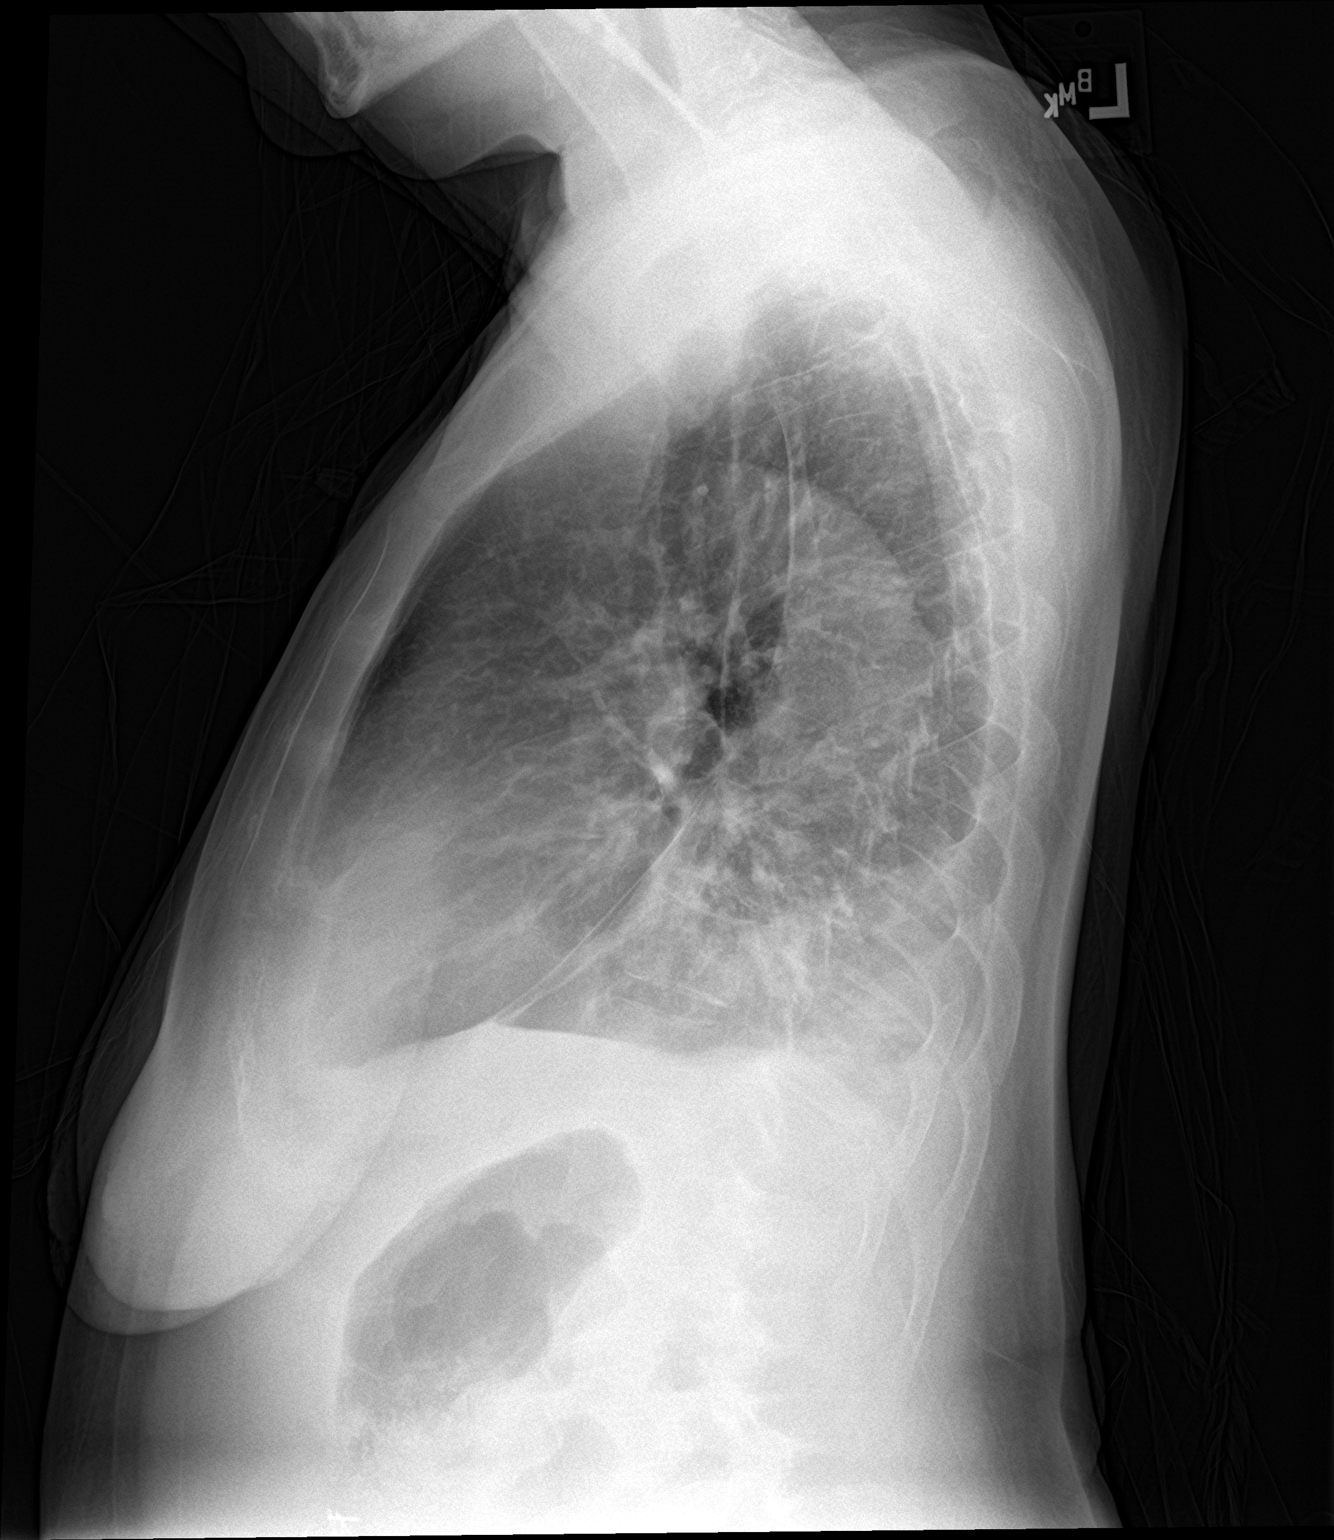

[2 of 2 positions shown; findings below may reference images not displayed]

FINDINGS: Stable cardiomegaly. Left lower lobe basilar consolidation and
effusion. Clear right lung. Bones are unremarkable.
IMPRESSION: Left lower lobe basilar consolidation and small left pleural
effusion likely represents pneumonia. Stable cardiomegaly.

By: Victor Emmanuel Afg M.D.

## 2018-03-04 ENCOUNTER — Telehealth: Payer: Self-pay | Admitting: Family

## 2018-03-04 ENCOUNTER — Encounter: Payer: Self-pay | Admitting: Family

## 2018-03-04 ENCOUNTER — Ambulatory Visit: Payer: Medicaid Other | Attending: Family | Admitting: Family

## 2018-03-04 VITALS — BP 139/92 | HR 93 | Resp 18 | Ht 59.0 in | Wt 110.0 lb

## 2018-03-04 DIAGNOSIS — I5042 Chronic combined systolic (congestive) and diastolic (congestive) heart failure: Secondary | ICD-10-CM | POA: Insufficient documentation

## 2018-03-04 DIAGNOSIS — E876 Hypokalemia: Secondary | ICD-10-CM | POA: Insufficient documentation

## 2018-03-04 DIAGNOSIS — Z801 Family history of malignant neoplasm of trachea, bronchus and lung: Secondary | ICD-10-CM | POA: Insufficient documentation

## 2018-03-04 DIAGNOSIS — J189 Pneumonia, unspecified organism: Secondary | ICD-10-CM | POA: Insufficient documentation

## 2018-03-04 DIAGNOSIS — Z8249 Family history of ischemic heart disease and other diseases of the circulatory system: Secondary | ICD-10-CM | POA: Insufficient documentation

## 2018-03-04 DIAGNOSIS — I5022 Chronic systolic (congestive) heart failure: Secondary | ICD-10-CM

## 2018-03-04 DIAGNOSIS — J90 Pleural effusion, not elsewhere classified: Secondary | ICD-10-CM | POA: Insufficient documentation

## 2018-03-04 DIAGNOSIS — I10 Essential (primary) hypertension: Secondary | ICD-10-CM

## 2018-03-04 DIAGNOSIS — E119 Type 2 diabetes mellitus without complications: Secondary | ICD-10-CM | POA: Insufficient documentation

## 2018-03-04 DIAGNOSIS — I110 Hypertensive heart disease with heart failure: Secondary | ICD-10-CM | POA: Insufficient documentation

## 2018-03-04 DIAGNOSIS — Z7982 Long term (current) use of aspirin: Secondary | ICD-10-CM | POA: Insufficient documentation

## 2018-03-04 DIAGNOSIS — Z79899 Other long term (current) drug therapy: Secondary | ICD-10-CM | POA: Insufficient documentation

## 2018-03-04 LAB — BASIC METABOLIC PANEL
Anion gap: 9 (ref 5–15)
BUN: 15 mg/dL (ref 6–20)
CO2: 27 mmol/L (ref 22–32)
CREATININE: 1.01 mg/dL — AB (ref 0.44–1.00)
Calcium: 9.1 mg/dL (ref 8.9–10.3)
Chloride: 105 mmol/L (ref 98–111)
GFR calc Af Amer: >60 mL/min (ref >60)
GLUCOSE: 151 mg/dL — AB (ref 70–99)
Potassium: 3.2 mmol/L — ABNORMAL LOW (ref 3.5–5.1)
Sodium: 141 mmol/L (ref 135–145)

## 2018-03-04 MED ORDER — SPIRONOLACTONE 25 MG PO TABS: 25.0000 mg | ORAL_TABLET | Freq: Every day | ORAL | 3 refills | Status: DC

## 2018-03-04 NOTE — Progress Notes (Signed)
Patient ID: Erin Hayes, female    DOB: Sep 21, 1965, 51 y.o.   MRN: [id number]  HPI  Ms Booze is a 52 y/o female with a history of diabetes, GERD, hyperlipidemia, HTN, pneumonia and chronic heart failure.   Echo report from 09/25/17 reviewed and showed an EF of 20-25% along with mild MR. Echo report from 04/04/17 reviewed and showed an EF of 20-25% along with a left pleural effusion.   Cardiac catheterization done 04/05/17 and showed EF of 25% along with severely elevated LV end diastolic pressure along with mild nonobstructive CAD.  Was in the ED 10/23/17 due to nausea/ vomiting where she was treated and released. Abdominal ultrasound and CXR were negative.   She presents today for her follow-up visit with a chief complaint of moderate fatigue upon minimal exertion. She describes this as chronic in nature having been present for several years although she does feel like it's worsened recently. She has associated light-headedness and intermittent chest pain along with this. She denies any difficulty sleeping, abdominal distention, palpitations, pedal edema, shortness of breath, cough or weight gain.   Past Medical History:  Diagnosis Date  . Chronic combined systolic (congestive) and diastolic (congestive) heart failure ([place])    a. 03/2017 Echo: EF 20-25%, diff HK, Gr2 DD.  . Diabetes ([place])   . GERD (gastroesophageal reflux disease)   . HLD (hyperlipidemia)   . Hypertension   . NICM (nonischemic cardiomyopathy) ([place])    a. 03/2017 Echo: EF 20-25%, diff HK, Gr2 DD, mild to mod MR, nl RV fxn;  b. 03/2017 Cath: mild nonobs dzs, EF 25%.  . Pleural effusion, left    a. 03/2017 s/p thoracentesis-->300 ml withdrawn--transudative.  . Pneumonia 02/19/2017   Past Surgical History:  Procedure Laterality Date  . COLONOSCOPY WITH PROPOFOL N/A 01/21/2017   Procedure: COLONOSCOPY WITH PROPOFOL;  Surgeon: Jonathon Bellows, MD;  Location: [facility] ENDOSCOPY;  Service: Gastroenterology;  Laterality: N/A;  .  ESOPHAGOGASTRODUODENOSCOPY (EGD) WITH PROPOFOL N/A 01/21/2017   Procedure: ESOPHAGOGASTRODUODENOSCOPY (EGD) WITH PROPOFOL;  Surgeon: Jonathon Bellows, MD;  Location: [facility] ENDOSCOPY;  Service: Gastroenterology;  Laterality: N/A;  . FLEXIBLE SIGMOIDOSCOPY N/A 11/04/2016   Procedure: FLEXIBLE SIGMOIDOSCOPY;  Surgeon: Wilford Corner, MD;  Location: [facility] ENDOSCOPY;  Service: Endoscopy;  Laterality: N/A;  . LEFT HEART CATH AND CORONARY ANGIOGRAPHY N/A 04/05/2017   Procedure: LEFT HEART CATH AND CORONARY ANGIOGRAPHY;  Surgeon: Wellington Hampshire, MD;  Location: [facility] CV LAB;  Service: Cardiovascular;  Laterality: N/A;  . LITHOTRIPSY     Family History  Problem Relation Age of Onset  . Lung cancer Mother   . Hypertension Father   . CAD Father        a. MI age 46  . COPD Neg Hx   . Diabetes Mellitus II Neg Hx    Social History   Tobacco Use  . Smoking status: Never Smoker  . Smokeless tobacco: Never Used  Substance Use Topics  . Alcohol use: No   No Known Allergies  Prior to Admission medications   Medication Sig Start Date End Date Taking? Authorizing Provider  albuterol (PROVENTIL HFA;VENTOLIN HFA) 108 (90 Base) MCG/ACT inhaler Inhale 2 puffs into the lungs every 4 (four) hours as needed for wheezing or shortness of breath. 04/26/17  Yes Darylene Price A, FNP  aspirin 81 MG tablet Take 81 mg by mouth daily.   Yes [provider]  carvedilol (COREG) 3.125 MG tablet Take 1 tablet (3.125 mg total) by mouth 2 (two) times daily  with a meal. 08/22/17  Yes Gollan, Kathlene November, MD  furosemide (LASIX) 20 MG tablet Take 1 tablet (20 mg total) by mouth daily. 07/08/17  Yes Darylene Price A, FNP  loperamide (IMODIUM) 2 MG capsule Take 2 capsules (4 mg total) by mouth as needed for diarrhea or loose stools. 11/06/16  Yes Dustin Flock, MD  sacubitril-valsartan (ENTRESTO) 49-51 MG Take 1 tablet by mouth 2 (two) times daily. 10/01/17  Yes Minna Merritts, MD  spironolactone (ALDACTONE) 25 MG tablet  Take 0.5 tablets (12.5 mg total) by mouth daily. 06/06/17  Yes Tinslee Klare, Otila Kluver A, FNP  Blood Glucose Monitoring Suppl (FIFTY50 GLUCOSE METER 2.0) w/Device KIT Frequency:PHARMDIR   Dosage:0.0     Instructions:  Note:use as directed by [place] (up to four times daily) Dose: 1 LANCET 05/13/13   [provider]  cholestyramine (QUESTRAN) 4 g packet Take 1 packet (4 g total) by mouth 3 (three) times daily with meals. Patient not taking: Reported on 03/04/2018 11/20/17   Jonathon Bellows, MD  glucose blood (BAYER CONTOUR TEST) test strip by Other route Two (2) times a day. 05/13/13   [provider]  ondansetron (ZOFRAN ODT) 4 MG disintegrating tablet Take 1 tablet (4 mg total) by mouth every 8 (eight) hours as needed for nausea or vomiting. Patient not taking: Reported on 03/04/2018 10/23/17   Merlyn Lot, MD   Review of Systems  Constitutional: Positive for fatigue (tire easily). Negative for appetite change.  HENT: Positive for rhinorrhea. Negative for congestion and sore throat.   Eyes: Negative.   Respiratory: Negative for cough, chest tightness, shortness of breath and wheezing.   Cardiovascular: Positive for chest pain (intermittently). Negative for palpitations and leg swelling.  Gastrointestinal: Negative for abdominal distention and abdominal pain.  Endocrine: Negative.   Genitourinary: Negative.   Musculoskeletal: Negative for back pain and neck pain.  Skin: Negative.   Allergic/Immunologic: Negative.   Neurological: Positive for light-headedness. Negative for dizziness.  Hematological: Negative for adenopathy. Does not bruise/bleed easily.  Psychiatric/Behavioral: Negative for dysphoric mood and sleep disturbance (sleeping on 3 pillows for comfort). The patient is not nervous/anxious.    Vitals:   03/04/18 1132  BP: (!) 139/92  Pulse: 93  Resp: 18  Weight: 110 lb (49.9 kg)  Height: _0  (1.499 m)   Wt Readings from Last 3 Encounters:  03/04/18 110 lb  (49.9 kg)  01/08/18 110 lb 9.6 oz (50.2 kg)  11/11/17 105 lb 3.2 oz (47.7 kg)   Lab Results  Component Value Date   CREATININE 0.84 10/23/2017   CREATININE 0.87 08/27/2017   CREATININE 0.76 05/20/2017    Physical Exam  Constitutional: She is oriented to person, place, and time. She appears well-developed and well-nourished.  HENT:  Head: Normocephalic and atraumatic.  Neck: Normal range of motion. Neck supple. No JVD present.  Cardiovascular: Normal rate and regular rhythm.  Pulmonary/Chest: Effort normal. She has no wheezes. She has no rales.  Abdominal: Soft. She exhibits no distension. There is no tenderness.  Musculoskeletal: She exhibits no edema or tenderness.  Neurological: She is alert and oriented to person, place, and time.  Skin: Skin is warm and dry.  Psychiatric: She has a normal mood and affect. Her behavior is normal. Thought content normal.  Nursing note and vitals reviewed.   Assessment & Plan:  1: Chronic heart failure with reduced ejection fraction- - NYHA class III - euvolemic today - weighing daily. Reminded to call for an overnight weight gain of >  2 pounds or a weekly weight gain of >5 pounds - weight up 2 pounds since last visit here 6 months ago - not adding salt to her food and she was reminded to keep daily sodium intake to <2040m/day - discussed titrating up entresto or carvedilol at her next OV - saw cardiology (Rockey Situ 08/22/17 - BNP 04/03/17 was 1287.0  2: HTN- - BP mildly elevated today - BMP from 10/23/17 reviewed and showed sodium 138, potassium 2.9, creatinine 0.84 and GFR >60 - will get BMP today due to hypokalemia a few months ago; most likely hypokalemia was due to her n/v  - went to [facility] 08/21/17   3: Diabetes- - currently diet controlled  Medication bottles were reviewed.   Return in 3 months or sooner for any questions/problems before then.

## 2018-03-04 NOTE — Telephone Encounter (Signed)
Spoke with patient regarding lab results obtained today (03/04/18). Potassium level is still low at 3.2. Renal function looks good.  She is currently taking 12.5mg  spironolactone. Advised patient to increase her spironolactone to 25mg  daily. Will repeat BMP at her next visit if not rechecked elsewhere prior.

## 2018-03-04 NOTE — Patient Instructions (Signed)
Continue weighing daily and call for an overnight weight gain of > 2 pounds or a weekly weight gain of >5 pounds. 

## 2018-03-27 ENCOUNTER — Ambulatory Visit: Payer: Medicaid Other | Admitting: Urology

## 2018-03-27 VITALS — BP 144/102 | HR 90 | Temp 98.2°F | Ht <= 58 in | Wt 115.8 lb

## 2018-03-27 DIAGNOSIS — B351 Tinea unguium: Secondary | ICD-10-CM

## 2018-03-27 NOTE — Progress Notes (Signed)
Subjective:    Patient ID: Erin Hayes, female    DOB: 01/28/1966, 52 y.o.   MRN: [id number]  HPI She states she was to be referred to podiatry, but it hasn't happened yet.  Left toe is "jacked up" and she is numb in her first two toes.  This has been going on for months.    Allergies as of 03/27/2018   No Known Allergies     Medication List        Accurate as of 03/27/18  7:59 PM. Always use your most recent med list.          albuterol 108 (90 Base) MCG/ACT inhaler Commonly known as:  PROVENTIL HFA;VENTOLIN HFA Inhale 2 puffs into the lungs every 4 (four) hours as needed for wheezing or shortness of breath.   aspirin 81 MG tablet Take 81 mg by mouth daily.   BAYER CONTOUR TEST test strip Generic drug:  glucose blood by Other route Two (2) times a day.   carvedilol 3.125 MG tablet Commonly known as:  COREG Take 1 tablet (3.125 mg total) by mouth 2 (two) times daily with a meal.   cholestyramine 4 g packet Commonly known as:  QUESTRAN Take 1 packet (4 g total) by mouth 3 (three) times daily with meals.   FIFTY50 GLUCOSE METER 2.0 w/Device Kit Frequency:PHARMDIR   Dosage:0.0     Instructions:  Note:use as directed by [facility] (up to four times daily) Dose: 1 LANCET   furosemide 20 MG tablet Commonly known as:  LASIX Take 1 tablet (20 mg total) by mouth daily.   loperamide 2 MG capsule Commonly known as:  IMODIUM Take 2 capsules (4 mg total) by mouth as needed for diarrhea or loose stools.   ondansetron 4 MG disintegrating tablet Commonly known as:  ZOFRAN-ODT Take 1 tablet (4 mg total) by mouth every 8 (eight) hours as needed for nausea or vomiting.   sacubitril-valsartan 49-51 MG Commonly known as:  ENTRESTO Take 1 tablet by mouth 2 (two) times daily.   spironolactone 25 MG tablet Commonly known as:  ALDACTONE Take 1 tablet (25 mg total) by mouth daily.      Patient Active Problem List   Diagnosis Date Noted  . NICM (nonischemic  cardiomyopathy) (HCC) 08/22/2017  . CHF (congestive heart failure) (HCC) 04/03/2017  . Protein-calorie malnutrition, severe 11/02/2016  . Hypokalemia 04/28/2013  . Leukocytosis, unspecified 04/28/2013  . Diabetes (H[facility])   . Hypertension   . Hypercholesterolemia 11/10/2012      Review of Systems     Objective:   Physical Exam  Constitutional: She is oriented to person, place, and time.  Cardiovascular: Normal rate, regular rhythm and normal heart sounds.  Pulmonary/Chest: Effort normal and breath sounds normal.  Neurological: She is alert and oriented to person, place, and time.  Skin:  Left foot with thick overgrown nails.  Her big toe nail is 5 cm in length and thick and hard.      BP (!) 144/102 (BP Location: Left Arm, Cuff Size: Small)   Pulse 90   Temp 98.2 F (36.8 C)   Ht 4' 8.75" (1.441 m)   Wt 115 lb 12.8 oz (52.5 kg)   BMI 25.28 kg/m       Assessment & Plan:   1. Onychomycosis of the big toe  Urgent referral to podiatry  2. Uncontrolled DM  Will need to have labs scheduled   F/u in 3 months with labs a week prior.     

## 2018-04-02 ENCOUNTER — Ambulatory Visit: Payer: Medicaid Other | Attending: Family | Admitting: Family

## 2018-04-02 ENCOUNTER — Encounter: Payer: Self-pay | Admitting: Family

## 2018-04-02 VITALS — BP 128/98 | HR 110 | Resp 18 | Ht <= 58 in | Wt 113.0 lb

## 2018-04-02 DIAGNOSIS — I110 Hypertensive heart disease with heart failure: Secondary | ICD-10-CM | POA: Insufficient documentation

## 2018-04-02 DIAGNOSIS — Z7982 Long term (current) use of aspirin: Secondary | ICD-10-CM | POA: Insufficient documentation

## 2018-04-02 DIAGNOSIS — I5022 Chronic systolic (congestive) heart failure: Secondary | ICD-10-CM | POA: Insufficient documentation

## 2018-04-02 DIAGNOSIS — E785 Hyperlipidemia, unspecified: Secondary | ICD-10-CM | POA: Insufficient documentation

## 2018-04-02 DIAGNOSIS — I428 Other cardiomyopathies: Secondary | ICD-10-CM | POA: Insufficient documentation

## 2018-04-02 DIAGNOSIS — Z8249 Family history of ischemic heart disease and other diseases of the circulatory system: Secondary | ICD-10-CM | POA: Insufficient documentation

## 2018-04-02 DIAGNOSIS — I10 Essential (primary) hypertension: Secondary | ICD-10-CM

## 2018-04-02 DIAGNOSIS — Z79899 Other long term (current) drug therapy: Secondary | ICD-10-CM | POA: Insufficient documentation

## 2018-04-02 DIAGNOSIS — E119 Type 2 diabetes mellitus without complications: Secondary | ICD-10-CM | POA: Insufficient documentation

## 2018-04-02 DIAGNOSIS — K219 Gastro-esophageal reflux disease without esophagitis: Secondary | ICD-10-CM | POA: Insufficient documentation

## 2018-04-02 DIAGNOSIS — Z801 Family history of malignant neoplasm of trachea, bronchus and lung: Secondary | ICD-10-CM | POA: Insufficient documentation

## 2018-04-02 LAB — BASIC METABOLIC PANEL
ANION GAP: 11 (ref 5–15)
BUN: 18 mg/dL (ref 6–20)
CHLORIDE: 101 mmol/L (ref 98–111)
CO2: 25 mmol/L (ref 22–32)
Calcium: 9.3 mg/dL (ref 8.9–10.3)
Creatinine, Ser: 1.03 mg/dL — ABNORMAL HIGH (ref 0.44–1.00)
GFR calc Af Amer: >60 mL/min (ref >60)
GFR calc non Af Amer: >60 mL/min (ref >60)
GLUCOSE: 290 mg/dL — AB (ref 70–99)
Potassium: 4 mmol/L (ref 3.5–5.1)
Sodium: 137 mmol/L (ref 135–145)

## 2018-04-02 MED ORDER — CARVEDILOL 6.25 MG PO TABS: 6.2500 mg | ORAL_TABLET | Freq: Two times a day (BID) | ORAL | 3 refills | Status: DC

## 2018-04-02 NOTE — Patient Instructions (Addendum)
Continue weighing daily and call for an overnight weight gain of > 2 pounds or a weekly weight gain of >5 pounds.  Increase carvedilol to 2 tablets twice daily.  

## 2018-04-02 NOTE — Progress Notes (Signed)
Patient ID: Erin Hayes, female    DOB: 03/31/1966, 52 y.o.   MRN: [id number]  HPI  Erin Hayes is a 52 y/o female with a history of diabetes, GERD, hyperlipidemia, HTN, pneumonia and chronic heart failure.   Echo report from 09/25/17 reviewed and showed an EF of 20-25% along with mild MR. Echo report from 04/04/17 reviewed and showed an EF of 20-25% along with a left pleural effusion.   Cardiac catheterization done 04/05/17 and showed EF of 25% along with severely elevated LV end diastolic pressure along with mild nonobstructive CAD.  Was in the ED 10/23/17 due to nausea/ vomiting where she was treated and released. Abdominal ultrasound and CXR were negative.   She presents today for an acute visit complaining of headache, dizziness and elevated BP at home today. She says her BP at home today was 179/112. This was before she had taken any of her medications. She has associated fatigue along with this. She denies any difficulty sleeping, abdominal distention, palpitations, pedal edema, chest pain, wheezing, shortness of breath or cough. She says that she had a Thanksgiving meal at church yesterday and ate some ham which tasted quite salty to her. As of her 12:45pm appointment here today, she has not taken any of her medications yet.    Past Medical History:  Diagnosis Date  . Carpal tunnel syndrome of right wrist   . Chronic combined systolic (congestive) and diastolic (congestive) heart failure ([place])    a. 03/2017 Echo: EF 20-25%, diff HK, Gr2 DD.  . Diabetes ([place])   . GERD (gastroesophageal reflux disease)   . HLD (hyperlipidemia)   . Hypertension   . NICM (nonischemic cardiomyopathy) ([place])    a. 03/2017 Echo: EF 20-25%, diff HK, Gr2 DD, mild to mod MR, nl RV fxn;  b. 03/2017 Cath: mild nonobs dzs, EF 25%.  . Pleural effusion, left    a. 03/2017 s/p thoracentesis-->300 ml withdrawn--transudative.  . Pneumonia 02/19/2017   Past Surgical History:  Procedure Laterality Date  .  COLONOSCOPY WITH PROPOFOL N/A 01/21/2017   Procedure: COLONOSCOPY WITH PROPOFOL;  Surgeon: Jonathon Bellows, MD;  Location: [facility] ENDOSCOPY;  Service: Gastroenterology;  Laterality: N/A;  . ESOPHAGOGASTRODUODENOSCOPY (EGD) WITH PROPOFOL N/A 01/21/2017   Procedure: ESOPHAGOGASTRODUODENOSCOPY (EGD) WITH PROPOFOL;  Surgeon: Jonathon Bellows, MD;  Location: [facility] ENDOSCOPY;  Service: Gastroenterology;  Laterality: N/A;  . FLEXIBLE SIGMOIDOSCOPY N/A 11/04/2016   Procedure: FLEXIBLE SIGMOIDOSCOPY;  Surgeon: Wilford Corner, MD;  Location: [facility] ENDOSCOPY;  Service: Endoscopy;  Laterality: N/A;  . LEFT HEART CATH AND CORONARY ANGIOGRAPHY N/A 04/05/2017   Procedure: LEFT HEART CATH AND CORONARY ANGIOGRAPHY;  Surgeon: Wellington Hampshire, MD;  Location: [place] CV LAB;  Service: Cardiovascular;  Laterality: N/A;  . LITHOTRIPSY     Family History  Problem Relation Age of Onset  . Lung cancer Mother   . Hypertension Father   . CAD Father        a. MI age 9  . COPD Neg Hx   . Diabetes Mellitus II Neg Hx    Social History   Tobacco Use  . Smoking status: Never Smoker  . Smokeless tobacco: Never Used  Substance Use Topics  . Alcohol use: No   No Known Allergies  Prior to Admission medications   Medication Sig Start Date End Date Taking? Authorizing Provider  albuterol (PROVENTIL HFA;VENTOLIN HFA) 108 (90 Base) MCG/ACT inhaler Inhale 2 puffs into the lungs every 4 (four) hours as needed for wheezing or shortness of breath.  04/26/17  Yes Darylene Price A, FNP  aspirin 81 MG tablet Take 81 mg by mouth daily.   Yes [provider]  carvedilol (COREG) 3.125 MG tablet Take 1 tablet (3.125 mg total) by mouth 2 (two) times daily with a meal. 08/22/17  Yes Gollan, Kathlene November, MD  furosemide (LASIX) 20 MG tablet Take 1 tablet (20 mg total) by mouth daily. 07/08/17  Yes Talissa Apple A, FNP  glucose blood (BAYER CONTOUR TEST) test strip by Other route Two (2) times a day. 05/13/13  Yes [provider]   sacubitril-valsartan (ENTRESTO) 49-51 MG Take 1 tablet by mouth 2 (two) times daily. 10/01/17  Yes Minna Merritts, MD  spironolactone (ALDACTONE) 25 MG tablet Take 1 tablet (25 mg total) by mouth daily. 03/04/18  Yes Eniya Cannady, Otila Kluver A, FNP  Blood Glucose Monitoring Suppl (FIFTY50 GLUCOSE METER 2.0) w/Device KIT Frequency:PHARMDIR   Dosage:0.0     Instructions:  Note:use as directed by [place] (up to four times daily) Dose: 1 LANCET 05/13/13   [provider]  cholestyramine (QUESTRAN) 4 g packet Take 1 packet (4 g total) by mouth 3 (three) times daily with meals. Patient not taking: Reported on 03/04/2018 11/20/17   Jonathon Bellows, MD  loperamide (IMODIUM) 2 MG capsule Take 2 capsules (4 mg total) by mouth as needed for diarrhea or loose stools. Patient not taking: Reported on 04/02/2018 11/06/16   Dustin Flock, MD  ondansetron (ZOFRAN ODT) 4 MG disintegrating tablet Take 1 tablet (4 mg total) by mouth every 8 (eight) hours as needed for nausea or vomiting. Patient not taking: Reported on 03/04/2018 10/23/17   Merlyn Lot, MD     Review of Systems  Constitutional: Positive for fatigue (tire easily). Negative for appetite change and fever.  HENT: Positive for rhinorrhea. Negative for congestion and sore throat.   Eyes: Negative for pain.  Respiratory: Negative for cough, chest tightness, shortness of breath and wheezing.   Cardiovascular: Negative for chest pain, palpitations and leg swelling.  Gastrointestinal: Negative for abdominal distention and abdominal pain.  Endocrine: Negative.   Genitourinary: Negative.   Musculoskeletal: Negative for back pain and neck pain.  Skin: Negative.   Allergic/Immunologic: Negative.   Neurological: Positive for dizziness (last 2 days) and headaches. Negative for light-headedness.  Hematological: Negative for adenopathy. Does not bruise/bleed easily.  Psychiatric/Behavioral: Negative for dysphoric mood and sleep disturbance (sleeping  on 3 pillows for comfort). The patient is not nervous/anxious.    Vitals:   04/02/18 1238  BP: (!) 128/98  Pulse: (!) 110  Resp: 18  SpO2: 99%  Weight: 113 lb (51.3 kg)  Height: _0  (1.422 m)   Wt Readings from Last 3 Encounters:  04/02/18 113 lb (51.3 kg)  03/27/18 115 lb 12.8 oz (52.5 kg)  03/04/18 110 lb (49.9 kg)   Lab Results  Component Value Date   CREATININE 1.01 (H) 03/04/2018   CREATININE 0.84 10/23/2017   CREATININE 0.87 08/27/2017    Physical Exam  Constitutional: She is oriented to person, place, and time. She appears well-developed and well-nourished.  HENT:  Head: Normocephalic and atraumatic.  Neck: Normal range of motion. Neck supple. No JVD present.  Cardiovascular: Regular rhythm. Tachycardia present.  Pulmonary/Chest: Effort normal. She has no wheezes. She has no rales.  Abdominal: Soft. She exhibits no distension. There is no tenderness.  Musculoskeletal: She exhibits no edema or tenderness.  Neurological: She is alert and oriented to person, place, and time.  Skin: Skin is warm  and dry.  Psychiatric: She has a normal mood and affect. Her behavior is normal. Thought content normal.  Nursing note and vitals reviewed.   Assessment & Plan:  1: Chronic heart failure with reduced ejection fraction- - NYHA class III - euvolemic today - weighing daily. Reminded to call for an overnight weight gain of >2 pounds or a weekly weight gain of >5 pounds - weight up 3 pounds since she was last here 1 month ago - not adding salt to her food and she was reminded to keep daily sodium intake to <2064m/day; discussed watching carefully what she eats over the Thanksgiving holiday - if she has above weight gain over the holiday, she can take an additional furosemide - will increase her carvedilol to 6.248mBID; she will finish out her current bottle by taking 2 of her 3.12531mablets twice daily until they are gone - saw cardiology (GoRockey Situ/18/19 - BNP 04/03/17 was  1287.0  2: HTN- - BP looks ok in clinic (128/98) - BMP from 03/04/18 reviewed and showed sodium 141, potassium 3.2, creatinine 1.01 and GFR >60 - will get BMP today - went to [facility] 03/27/18   Medication bottles were reviewed.   Return in 3 weeks or sooner for any questions/problems before then.

## 2018-04-25 ENCOUNTER — Ambulatory Visit: Payer: Medicaid Other | Admitting: Family

## 2018-04-25 ENCOUNTER — Telehealth: Payer: Self-pay | Admitting: Family

## 2018-04-25 ENCOUNTER — Encounter: Payer: Self-pay | Admitting: Podiatry

## 2018-04-25 NOTE — Telephone Encounter (Signed)
Patient did not show for her [facility] Failure [facility] appointment on 04/25/18. Will attempt to reschedule.

## 2018-05-03 NOTE — Progress Notes (Signed)
This encounter was created in error - please disregard.

## 2018-05-19 ENCOUNTER — Ambulatory Visit: Payer: Medicaid Other | Admitting: Family

## 2018-05-21 ENCOUNTER — Encounter: Payer: Self-pay | Admitting: Emergency Medicine

## 2018-05-21 ENCOUNTER — Other Ambulatory Visit: Payer: Self-pay

## 2018-05-21 ENCOUNTER — Inpatient Hospital Stay
Admission: EM | Admit: 2018-05-21 | Discharge: 2018-05-23 | DRG: 641 | Disposition: A | Discharge status: Home / Self Care | Payer: Medicaid Other | Attending: Specialist | Admitting: Specialist

## 2018-05-21 ENCOUNTER — Emergency Department: Payer: Medicaid Other

## 2018-05-21 DIAGNOSIS — N390 Urinary tract infection, site not specified: Secondary | ICD-10-CM | POA: Diagnosis present

## 2018-05-21 DIAGNOSIS — N179 Acute kidney failure, unspecified: Secondary | ICD-10-CM | POA: Diagnosis present

## 2018-05-21 DIAGNOSIS — E1165 Type 2 diabetes mellitus with hyperglycemia: Secondary | ICD-10-CM | POA: Diagnosis present

## 2018-05-21 DIAGNOSIS — Z7984 Long term (current) use of oral hypoglycemic drugs: Secondary | ICD-10-CM

## 2018-05-21 DIAGNOSIS — Z79899 Other long term (current) drug therapy: Secondary | ICD-10-CM

## 2018-05-21 DIAGNOSIS — K219 Gastro-esophageal reflux disease without esophagitis: Secondary | ICD-10-CM | POA: Diagnosis present

## 2018-05-21 DIAGNOSIS — I5042 Chronic combined systolic (congestive) and diastolic (congestive) heart failure: Secondary | ICD-10-CM | POA: Diagnosis present

## 2018-05-21 DIAGNOSIS — E785 Hyperlipidemia, unspecified: Secondary | ICD-10-CM | POA: Diagnosis present

## 2018-05-21 DIAGNOSIS — I428 Other cardiomyopathies: Secondary | ICD-10-CM | POA: Diagnosis present

## 2018-05-21 DIAGNOSIS — Z7982 Long term (current) use of aspirin: Secondary | ICD-10-CM

## 2018-05-21 DIAGNOSIS — E860 Dehydration: Secondary | ICD-10-CM

## 2018-05-21 DIAGNOSIS — E876 Hypokalemia: Secondary | ICD-10-CM | POA: Diagnosis not present

## 2018-05-21 DIAGNOSIS — G5601 Carpal tunnel syndrome, right upper limb: Secondary | ICD-10-CM | POA: Diagnosis present

## 2018-05-21 DIAGNOSIS — I110 Hypertensive heart disease with heart failure: Secondary | ICD-10-CM | POA: Diagnosis present

## 2018-05-21 DIAGNOSIS — Z8249 Family history of ischemic heart disease and other diseases of the circulatory system: Secondary | ICD-10-CM | POA: Diagnosis not present

## 2018-05-21 DIAGNOSIS — E119 Type 2 diabetes mellitus without complications: Secondary | ICD-10-CM

## 2018-05-21 DIAGNOSIS — R739 Hyperglycemia, unspecified: Secondary | ICD-10-CM

## 2018-05-21 DIAGNOSIS — R197 Diarrhea, unspecified: Secondary | ICD-10-CM | POA: Diagnosis present

## 2018-05-21 LAB — URINALYSIS, COMPLETE (UACMP) WITH MICROSCOPIC
Bilirubin Urine: NEGATIVE
Glucose, UA: >=500 mg/dL — AB
Ketones, ur: 20 mg/dL — AB
Nitrite: POSITIVE — AB
PROTEIN: 30 mg/dL — AB
Specific Gravity, Urine: 1.022 (ref 1.005–1.030)
WBC, UA: >50 WBC/hpf — ABNORMAL HIGH (ref 0–5)
pH: 5 (ref 5.0–8.0)

## 2018-05-21 LAB — COMPREHENSIVE METABOLIC PANEL
ALT: 19 U/L (ref 0–44)
AST: 17 U/L (ref 15–41)
Albumin: 3.8 g/dL (ref 3.5–5.0)
Alkaline Phosphatase: 113 U/L (ref 38–126)
Anion gap: 15 (ref 5–15)
BUN: 13 mg/dL (ref 6–20)
CO2: 22 mmol/L (ref 22–32)
Calcium: 7.9 mg/dL — ABNORMAL LOW (ref 8.9–10.3)
Chloride: 94 mmol/L — ABNORMAL LOW (ref 98–111)
Creatinine, Ser: 1.6 mg/dL — ABNORMAL HIGH (ref 0.44–1.00)
GFR calc Af Amer: 42 mL/min — ABNORMAL LOW (ref >60)
GFR calc non Af Amer: 36 mL/min — ABNORMAL LOW (ref >60)
Glucose, Bld: 496 mg/dL — ABNORMAL HIGH (ref 70–99)
Potassium: 2.2 mmol/L — CL (ref 3.5–5.1)
SODIUM: 131 mmol/L — AB (ref 135–145)
Total Bilirubin: 1 mg/dL (ref 0.3–1.2)
Total Protein: 7 g/dL (ref 6.5–8.1)

## 2018-05-21 LAB — CBC WITH DIFFERENTIAL/PLATELET
Abs Immature Granulocytes: 0.07 10*3/uL (ref 0.00–0.07)
BASOS ABS: 0.1 10*3/uL (ref 0.0–0.1)
Basophils Relative: 1 %
Eosinophils Absolute: 0 10*3/uL (ref 0.0–0.5)
Eosinophils Relative: 0 %
HCT: 39.7 % (ref 36.0–46.0)
Hemoglobin: 13.2 g/dL (ref 12.0–15.0)
IMMATURE GRANULOCYTES: 1 %
Lymphocytes Relative: 15 %
Lymphs Abs: 1.9 10*3/uL (ref 0.7–4.0)
MCH: 27.8 pg (ref 26.0–34.0)
MCHC: 33.2 g/dL (ref 30.0–36.0)
MCV: 83.6 fL (ref 80.0–100.0)
Monocytes Absolute: 0.7 10*3/uL (ref 0.1–1.0)
Monocytes Relative: 6 %
NEUTROS ABS: 9.7 10*3/uL — AB (ref 1.7–7.7)
NRBC: 0 % (ref 0.0–0.2)
Neutrophils Relative %: 77 %
Platelets: 355 10*3/uL (ref 150–400)
RBC: 4.75 MIL/uL (ref 3.87–5.11)
RDW: 12 % (ref 11.5–15.5)
WBC: 12.5 10*3/uL — ABNORMAL HIGH (ref 4.0–10.5)

## 2018-05-21 LAB — BASIC METABOLIC PANEL WITH GFR
Anion gap: 12 (ref 5–15)
BUN: 13 mg/dL (ref 6–20)
CO2: 20 mmol/L — ABNORMAL LOW (ref 22–32)
Calcium: 7.5 mg/dL — ABNORMAL LOW (ref 8.9–10.3)
Chloride: 102 mmol/L (ref 98–111)
Creatinine, Ser: 1.32 mg/dL — ABNORMAL HIGH (ref 0.44–1.00)
GFR calc Af Amer: 53 mL/min — ABNORMAL LOW
GFR calc non Af Amer: 46 mL/min — ABNORMAL LOW
Glucose, Bld: 404 mg/dL — ABNORMAL HIGH (ref 70–99)
Potassium: 2.6 mmol/L — CL (ref 3.5–5.1)
Sodium: 134 mmol/L — ABNORMAL LOW (ref 135–145)

## 2018-05-21 LAB — GLUCOSE, CAPILLARY
GLUCOSE-CAPILLARY: 475 mg/dL — AB (ref 70–99)
Glucose-Capillary: 438 mg/dL — ABNORMAL HIGH (ref 70–99)
Glucose-Capillary: 387 mg/dL — ABNORMAL HIGH (ref 70–99)
Glucose-Capillary: 356 mg/dL — ABNORMAL HIGH (ref 70–99)
Glucose-Capillary: 364 mg/dL — ABNORMAL HIGH (ref 70–99)

## 2018-05-21 LAB — MAGNESIUM: Magnesium: 1.4 mg/dL — ABNORMAL LOW (ref 1.7–2.4)

## 2018-05-21 LAB — LIPASE, BLOOD: Lipase: 35 U/L (ref 11–51)

## 2018-05-21 MED ORDER — ASPIRIN EC 81 MG PO TBEC
81.0000 mg | DELAYED_RELEASE_TABLET | Freq: Every day | ORAL | Status: DC
  Administered 2018-05-22 – 2018-05-23 (×2): 81 mg via ORAL
  Filled 2018-05-21 (×2): qty 1

## 2018-05-21 MED ORDER — POTASSIUM CHLORIDE 20 MEQ PO PACK
40.0000 meq | PACK | Freq: Once | ORAL | Status: AC
  Administered 2018-05-21: 40 meq via ORAL
  Filled 2018-05-21: qty 2

## 2018-05-21 MED ORDER — ACETAMINOPHEN 650 MG RE SUPP: 650.0000 mg | Freq: Four times a day (QID) | RECTAL | Status: DC | PRN

## 2018-05-21 MED ORDER — SODIUM CHLORIDE 0.9 % IV BOLUS
1000.0000 mL | Freq: Once | INTRAVENOUS | Status: AC
  Administered 2018-05-21: 1000 mL via INTRAVENOUS

## 2018-05-21 MED ORDER — SACUBITRIL-VALSARTAN 49-51 MG PO TABS
1.0000 | ORAL_TABLET | Freq: Two times a day (BID) | ORAL | Status: DC
  Administered 2018-05-21 – 2018-05-22 (×2): 1 via ORAL
  Filled 2018-05-21 (×5): qty 1

## 2018-05-21 MED ORDER — INSULIN ASPART 100 UNIT/ML ~~LOC~~ SOLN
0.0000 [IU] | Freq: Every day | SUBCUTANEOUS | Status: DC
  Administered 2018-05-21: 5 [IU] via SUBCUTANEOUS
  Administered 2018-05-22: 4 [IU] via SUBCUTANEOUS
  Filled 2018-05-21 (×2): qty 1

## 2018-05-21 MED ORDER — SENNOSIDES-DOCUSATE SODIUM 8.6-50 MG PO TABS: 1.0000 | ORAL_TABLET | Freq: Every evening | ORAL | Status: DC | PRN

## 2018-05-21 MED ORDER — ONDANSETRON HCL 4 MG/2ML IJ SOLN
4.0000 mg | Freq: Four times a day (QID) | INTRAMUSCULAR | Status: DC | PRN
  Filled 2018-05-21: qty 2

## 2018-05-21 MED ORDER — INSULIN ASPART 100 UNIT/ML ~~LOC~~ SOLN
10.0000 [IU] | Freq: Once | SUBCUTANEOUS | Status: AC
  Administered 2018-05-21: 10 [IU] via INTRAVENOUS
  Filled 2018-05-21: qty 1

## 2018-05-21 MED ORDER — ENOXAPARIN SODIUM 40 MG/0.4ML ~~LOC~~ SOLN
40.0000 mg | SUBCUTANEOUS | Status: DC
  Administered 2018-05-21 – 2018-05-22 (×2): 40 mg via SUBCUTANEOUS
  Filled 2018-05-21 (×3): qty 0.4

## 2018-05-21 MED ORDER — POTASSIUM CHLORIDE IN NACL 20-0.9 MEQ/L-% IV SOLN
INTRAVENOUS | Status: DC
  Administered 2018-05-21 – 2018-05-22 (×2): via INTRAVENOUS
  Filled 2018-05-21 (×4): qty 1000

## 2018-05-21 MED ORDER — ONDANSETRON HCL 4 MG PO TABS: 4.0000 mg | ORAL_TABLET | Freq: Four times a day (QID) | ORAL | Status: DC | PRN

## 2018-05-21 MED ORDER — INSULIN ASPART 100 UNIT/ML ~~LOC~~ SOLN
4.0000 [IU] | Freq: Three times a day (TID) | SUBCUTANEOUS | Status: DC
  Administered 2018-05-22: 4 [IU] via SUBCUTANEOUS
  Filled 2018-05-21 (×2): qty 1

## 2018-05-21 MED ORDER — ALBUTEROL SULFATE (2.5 MG/3ML) 0.083% IN NEBU: 2.5000 mg | INHALATION_SOLUTION | RESPIRATORY_TRACT | Status: DC | PRN

## 2018-05-21 MED ORDER — METFORMIN HCL 500 MG PO TABS: 500.0000 mg | ORAL_TABLET | Freq: Two times a day (BID) | ORAL | 0 refills | Status: DC

## 2018-05-21 MED ORDER — CARVEDILOL 3.125 MG PO TABS
6.2500 mg | ORAL_TABLET | Freq: Two times a day (BID) | ORAL | Status: DC
  Administered 2018-05-21 – 2018-05-22 (×3): 6.25 mg via ORAL
  Filled 2018-05-21 (×4): qty 2

## 2018-05-21 MED ORDER — INSULIN ASPART 100 UNIT/ML ~~LOC~~ SOLN
0.0000 [IU] | Freq: Three times a day (TID) | SUBCUTANEOUS | Status: DC
  Administered 2018-05-22: 15 [IU] via SUBCUTANEOUS
  Administered 2018-05-22 – 2018-05-23 (×2): 11 [IU] via SUBCUTANEOUS
  Administered 2018-05-23: 09:00:00 15 [IU] via SUBCUTANEOUS
  Filled 2018-05-21 (×5): qty 1

## 2018-05-21 MED ORDER — ACETAMINOPHEN 325 MG PO TABS: 650.0000 mg | ORAL_TABLET | Freq: Four times a day (QID) | ORAL | Status: DC | PRN

## 2018-05-21 MED ORDER — POTASSIUM CHLORIDE CRYS ER 20 MEQ PO TBCR
40.0000 meq | EXTENDED_RELEASE_TABLET | Freq: Two times a day (BID) | ORAL | Status: DC
  Administered 2018-05-21 – 2018-05-23 (×4): 40 meq via ORAL
  Filled 2018-05-21 (×4): qty 2

## 2018-05-21 NOTE — Progress Notes (Addendum)
Advanced care plan.  Purpose of the Encounter: CODE STATUS  Parties in Attendance: Patient  Patient's Decision Capacity: Good  Subjective/Patient's story: Presented to emergency room for nausea vomiting and diarrhea   Objective/Medical story Patient was evaluated and has low potassium level Needs aggressive IV fluids and potassium supplementation   Goals of care determination:  Advance care directives goals of care and treatment plan discussed Patient wants everything done which includes CPR, intubation ventilator if the need arises   CODE STATUS: Full code   Time spent discussing advanced care planning: 16 minutes

## 2018-05-21 NOTE — H&P (Signed)
[facility] NAME: Kolbie Clarkston    MR#:  [id number]  DATE OF BIRTH:  02/05/1966  DATE OF ADMISSION:  05/21/2018  PRIMARY CARE PHYSICIAN: Doles-Johnson, Teah, NP   REQUESTING/REFERRING PHYSICIAN:   CHIEF COMPLAINT:   Chief Complaint  Patient presents with  . Hyperglycemia    HISTORY OF PRESENT ILLNESS: Brooksie Ellwanger  is a 53 y.o. female with a known history of combined systolic diastolic heart failure, diabetes mellitus type 2, GERD, hyperlipidemia, hypertension, nonischemic cardiomyopathy with EF of 20 to 25% presented to the emergency room for nausea and vomiting and elevated blood sugar.  Patient was evaluated her blood sugar was around 400s in the emergency room.  She also had low potassium level.  80 Meq potassium chloride was given orally but potassium improved up to 2.6.  Hospitalist service was consulted.  PAST MEDICAL HISTORY:   Past Medical History:  Diagnosis Date  . Carpal tunnel syndrome of right wrist   . Chronic combined systolic (congestive) and diastolic (congestive) heart failure ([place])    a. 03/2017 Echo: EF 20-25%, diff HK, Gr2 DD.  . Diabetes ([place])   . GERD (gastroesophageal reflux disease)   . HLD (hyperlipidemia)   . Hypertension   . NICM (nonischemic cardiomyopathy) ([place])    a. 03/2017 Echo: EF 20-25%, diff HK, Gr2 DD, mild to mod MR, nl RV fxn;  b. 03/2017 Cath: mild nonobs dzs, EF 25%.  . Pleural effusion, left    a. 03/2017 s/p thoracentesis-->300 ml withdrawn--transudative.  . Pneumonia 02/19/2017    PAST SURGICAL HISTORY:  Past Surgical History:  Procedure Laterality Date  . COLONOSCOPY WITH PROPOFOL N/A 01/21/2017   Procedure: COLONOSCOPY WITH PROPOFOL;  Surgeon: Jonathon Bellows, MD;  Location: [facility] ENDOSCOPY;  Service: Gastroenterology;  Laterality: N/A;  . ESOPHAGOGASTRODUODENOSCOPY (EGD) WITH PROPOFOL N/A 01/21/2017   Procedure: ESOPHAGOGASTRODUODENOSCOPY (EGD) WITH PROPOFOL;  Surgeon: Jonathon Bellows, MD;  Location: [facility] ENDOSCOPY;  Service: Gastroenterology;  Laterality: N/A;  . FLEXIBLE SIGMOIDOSCOPY N/A 11/04/2016   Procedure: FLEXIBLE SIGMOIDOSCOPY;  Surgeon: Wilford Corner, MD;  Location: [facility] ENDOSCOPY;  Service: Endoscopy;  Laterality: N/A;  . LEFT HEART CATH AND CORONARY ANGIOGRAPHY N/A 04/05/2017   Procedure: LEFT HEART CATH AND CORONARY ANGIOGRAPHY;  Surgeon: Wellington Hampshire, MD;  Location: [place] CV LAB;  Service: Cardiovascular;  Laterality: N/A;  . LITHOTRIPSY      SOCIAL HISTORY:  Social History   Tobacco Use  . Smoking status: Never Smoker  . Smokeless tobacco: Never Used  Substance Use Topics  . Alcohol use: No    FAMILY HISTORY:  Family History  Problem Relation Age of Onset  . Lung cancer Mother   . Hypertension Father   . CAD Father        a. MI age 64  . COPD Neg Hx   . Diabetes Mellitus II Neg Hx     DRUG ALLERGIES: No Known Allergies  REVIEW OF SYSTEMS:   CONSTITUTIONAL: No fever, fatigue or weakness.  EYES: No blurred or double vision.  EARS, NOSE, AND THROAT: No tinnitus or ear pain.  RESPIRATORY: No cough, shortness of breath, wheezing or hemoptysis.  CARDIOVASCULAR: No chest pain, orthopnea, edema.  GASTROINTESTINAL: No nausea, vomiting, diarrhea or abdominal pain.  GENITOURINARY: No dysuria, hematuria.  ENDOCRINE: Has polyuria, nocturia,  HEMATOLOGY: No anemia, easy bruising or bleeding SKIN: No rash or lesion. MUSCULOSKELETAL: No joint pain or arthritis.   NEUROLOGIC: No tingling, numbness, weakness.  PSYCHIATRY: No  anxiety or depression.   MEDICATIONS AT HOME:  Prior to Admission medications   Medication Sig Start Date End Date Taking? Authorizing Provider  albuterol (PROVENTIL HFA;VENTOLIN HFA) 108 (90 Base) MCG/ACT inhaler Inhale 2 puffs into the lungs every 4 (four) hours as needed for wheezing or shortness of breath. 04/26/17  Yes Darylene Price A, FNP  aspirin 81 MG tablet Take 81 mg by mouth daily.   Yes  [provider]  carvedilol (COREG) 6.25 MG tablet Take 1 tablet (6.25 mg total) by mouth 2 (two) times daily. 04/02/18  Yes Hackney, Otila Kluver A, FNP  furosemide (LASIX) 20 MG tablet Take 1 tablet (20 mg total) by mouth daily. 07/08/17  Yes Hackney, Tina A, FNP  sacubitril-valsartan (ENTRESTO) 49-51 MG Take 1 tablet by mouth 2 (two) times daily. 10/01/17  Yes Minna Merritts, MD  spironolactone (ALDACTONE) 25 MG tablet Take 1 tablet (25 mg total) by mouth daily. 03/04/18  Yes Alisa Graff, FNP  metFORMIN (GLUCOPHAGE) 500 MG tablet Take 1 tablet (500 mg total) by mouth 2 (two) times daily with a meal. 05/21/18   Carrie Mew, MD      PHYSICAL EXAMINATION:   VITAL SIGNS: Blood pressure 106/76, pulse 90, temperature 98 F (36.7 C), temperature source Oral, resp. rate 16, height 4\' 11"  (1.499 m), weight 49.9 kg, SpO2 95 %.  GENERAL:  53 y.o.-year-old patient lying in the bed with no acute distress.  EYES: Pupils equal, round, reactive to light and accommodation. No scleral icterus. Extraocular muscles intact.  HEENT: Head atraumatic, normocephalic. Oropharynx and nasopharynx clear.  NECK:  Supple, no jugular venous distention. No thyroid enlargement, no tenderness.  LUNGS: Normal breath sounds bilaterally, no wheezing, rales,rhonchi or crepitation. No use of accessory muscles of respiration.  CARDIOVASCULAR: S1, S2 normal. No murmurs, rubs, or gallops.  ABDOMEN: Soft, nontender, nondistended. Bowel sounds present. No organomegaly or mass.  EXTREMITIES: No pedal edema, cyanosis, or clubbing.  NEUROLOGIC: Cranial nerves II through XII are intact. Muscle strength 5/5 in all extremities. Sensation intact. Gait not checked.  PSYCHIATRIC: The patient is alert and oriented x 3.  SKIN: No obvious rash, lesion, or ulcer.   LABORATORY PANEL:   CBC Recent Labs  Lab 05/21/18 1111  WBC 12.5*  HGB 13.2  HCT 39.7  PLT 355  MCV 83.6  MCH 27.8  MCHC 33.2  RDW 12.0  LYMPHSABS 1.9   MONOABS 0.7  EOSABS 0.0  BASOSABS 0.1   ------------------------------------------------------------------------------------------------------------------  Chemistries  Recent Labs  Lab 05/21/18 1111 05/21/18 1536  NA 131* 134*  K 2.2* 2.6*  CL 94* 102  CO2 22 20*  GLUCOSE 496* 404*  BUN 13 13  CREATININE 1.60* 1.32*  CALCIUM 7.9* 7.5*  AST 17  --   ALT 19  --   ALKPHOS 113  --   BILITOT 1.0  --    ------------------------------------------------------------------------------------------------------------------ estimated creatinine clearance is 33.2 mL/min (A) (by C-G formula based on SCr of 1.32 mg/dL (H)). ------------------------------------------------------------------------------------------------------------------ No results for input(s): TSH, T4TOTAL, T3FREE, THYROIDAB in the last 72 hours.  Invalid input(s): FREET3   Coagulation profile No results for input(s): INR, PROTIME in the last 168 hours. ------------------------------------------------------------------------------------------------------------------- No results for input(s): DDIMER in the last 72 hours. -------------------------------------------------------------------------------------------------------------------  Cardiac Enzymes No results for input(s): CKMB, TROPONINI, MYOGLOBIN in the last 168 hours.  Invalid input(s): CK ------------------------------------------------------------------------------------------------------------------ Invalid input(s): POCBNP  ---------------------------------------------------------------------------------------------------------------  Urinalysis    Component Value Date/Time   COLORURINE AMBER (A) 10/23/2017 1321   APPEARANCEUR CLOUDY (A)  10/23/2017 1321   APPEARANCEUR Clear 04/25/2013 2004   LABSPEC 1.016 10/23/2017 1321   LABSPEC 1.025 04/25/2013 2004   PHURINE 6.0 10/23/2017 1321   GLUCOSEU NEGATIVE 10/23/2017 1321   GLUCOSEU >=500 04/25/2013  2004   HGBUR NEGATIVE 10/23/2017 [place] 10/23/2017 1321   BILIRUBINUR Negative 04/25/2013 2004   KETONESUR NEGATIVE 10/23/2017 1321   PROTEINUR 30 (A) 10/23/2017 1321   NITRITE NEGATIVE 10/23/2017 1321   LEUKOCYTESUR LARGE (A) 10/23/2017 1321   LEUKOCYTESUR Negative 04/25/2013 2004     RADIOLOGY: Dg Chest Portable 1 View  Result Date: 05/21/2018 CLINICAL DATA:  Cough, hyperglycemia EXAM: PORTABLE CHEST 1 VIEW COMPARISON:  10/23/2017 FINDINGS: Heart and mediastinal contours are within normal limits. No focal opacities or effusions. No acute bony abnormality. IMPRESSION: No active cardiopulmonary disease. Electronically Signed   By: Rolm Baptise M.D.   On: 05/21/2018 12:32    EKG: Orders placed or performed during the hospital encounter of 10/23/17  . ED EKG  . ED EKG  . EKG 12-Lead  . EKG 12-Lead  . EKG    IMPRESSION AND PLAN:  53 year old female patient with history of combined systolic diastolic heart failure, nonischemic cardiomyopathy,, hypertension, hyperlipidemia, GERD, diabetes mellitus type 2 presented to the emergency room for nausea and vomiting  -Acute hypokalemia Admit patient to medical floor Potassium replacement aggressively Monitor for any arrhythmias  -Intractable nausea vomiting Antiemetic medications intravenously  -Uncontrolled diabetes mellitus Diabetic diet with sliding scale coverage with insulin  -Combined systolic and diastolic heart failure Stable  -Nonischemic cardiomyopathy Medical management  All the records are reviewed and case discussed with ED provider. Management plans discussed with the patient, family and they are in agreement.  CODE STATUS:Full code Code Status History    Date Active Date Inactive Code Status Order ID Comments User Context   04/03/2017 1955 04/08/2017 1631 Full Code [id number]  Henreitta Leber, MD Inpatient   02/19/2017 0203 02/20/2017 1627 Full Code [id number]  Saundra Shelling, MD ED    10/31/2016 2159 11/07/2016 1741 Full Code [id number]  Lance Coon, MD Inpatient   04/26/2013 0140 04/29/2013 2157 Full Code [id number]  Guy Begin, MD Inpatient       TOTAL TIME TAKING CARE OF THIS PATIENT: 52 minutes.    Saundra Shelling M.D on 05/21/2018 at 6:17 PM  Between 7am to 6pm - Pager - [phone]  After 6pm go to [email or web address] - password [email or web address] [place] [facility]  Office  [phone]  CC: Primary care physician; Staci Acosta, NP

## 2018-05-21 NOTE — ED Notes (Signed)
Date and time results received: 05/21/18 12:17 PM  (use smartphrase ".now" to insert current time)  Test: potassium Critical Value: 2.2  Name of Provider Notified: Joni Fears, EDP  Orders Received? Or Actions Taken?: Actions Taken: critical lab resutls aknowledged

## 2018-05-21 NOTE — ED Notes (Signed)
Pt was given water and saltines for PO challenge. Pt denies any N/V and is able to eat and drink at this time.

## 2018-05-21 NOTE — ED Provider Notes (Signed)
[facility] Emergency Department Provider Note  ____________________________________________  Time seen: Approximately 3:37 PM  I have reviewed the triage vital signs and the nursing notes.   HISTORY  Chief Complaint Hyperglycemia    HPI Erin Hayes is a 53 y.o. female with a history of hypertension  and diabetes who comes the ED complaining of feeling sick fatigued and having intermittent blurry vision for the past week.  Denies any focal pain shortness of breath or cough.  No vomiting or diarrhea.  She reports that she was taken off of her diabetes medication a year ago because blood sugar was diet-controlled.  Symptoms are constant, moderate intensity, no aggravating or alleviating factors.  She does endorse polyuria and polydipsia.     Past Medical History:  Diagnosis Date  . Carpal tunnel syndrome of right wrist   . Chronic combined systolic (congestive) and diastolic (congestive) heart failure ([place])    a. 03/2017 Echo: EF 20-25%, diff HK, Gr2 DD.  . Diabetes ([place])   . GERD (gastroesophageal reflux disease)   . HLD (hyperlipidemia)   . Hypertension   . NICM (nonischemic cardiomyopathy) ([place])    a. 03/2017 Echo: EF 20-25%, diff HK, Gr2 DD, mild to mod MR, nl RV fxn;  b. 03/2017 Cath: mild nonobs dzs, EF 25%.  . Pleural effusion, left    a. 03/2017 s/p thoracentesis-->300 ml withdrawn--transudative.  . Pneumonia 02/19/2017     Patient Active Problem List   Diagnosis Date Noted  . NICM (nonischemic cardiomyopathy) ([place]) 08/22/2017  . CHF (congestive heart failure) ([place]) 04/03/2017  . Protein-calorie malnutrition, severe 11/02/2016  . Hypokalemia 04/28/2013  . Leukocytosis, unspecified 04/28/2013  . Diabetes ([place])   . Hypertension   . Hypercholesterolemia 11/10/2012     Past Surgical History:  Procedure Laterality Date  . COLONOSCOPY WITH PROPOFOL N/A 01/21/2017   Procedure: COLONOSCOPY WITH PROPOFOL;  Surgeon: Jonathon Bellows, MD;   Location: [facility] ENDOSCOPY;  Service: Gastroenterology;  Laterality: N/A;  . ESOPHAGOGASTRODUODENOSCOPY (EGD) WITH PROPOFOL N/A 01/21/2017   Procedure: ESOPHAGOGASTRODUODENOSCOPY (EGD) WITH PROPOFOL;  Surgeon: Jonathon Bellows, MD;  Location: [facility] ENDOSCOPY;  Service: Gastroenterology;  Laterality: N/A;  . FLEXIBLE SIGMOIDOSCOPY N/A 11/04/2016   Procedure: FLEXIBLE SIGMOIDOSCOPY;  Surgeon: Wilford Corner, MD;  Location: [facility] ENDOSCOPY;  Service: Endoscopy;  Laterality: N/A;  . LEFT HEART CATH AND CORONARY ANGIOGRAPHY N/A 04/05/2017   Procedure: LEFT HEART CATH AND CORONARY ANGIOGRAPHY;  Surgeon: Wellington Hampshire, MD;  Location: [place] CV LAB;  Service: Cardiovascular;  Laterality: N/A;  . LITHOTRIPSY       Prior to Admission medications   Medication Sig Start Date End Date Taking? Authorizing Provider  albuterol (PROVENTIL HFA;VENTOLIN HFA) 108 (90 Base) MCG/ACT inhaler Inhale 2 puffs into the lungs every 4 (four) hours as needed for wheezing or shortness of breath. 04/26/17  Yes Darylene Price A, FNP  aspirin 81 MG tablet Take 81 mg by mouth daily.   Yes [provider]  carvedilol (COREG) 6.25 MG tablet Take 1 tablet (6.25 mg total) by mouth 2 (two) times daily. 04/02/18  Yes Hackney, Otila Kluver A, FNP  furosemide (LASIX) 20 MG tablet Take 1 tablet (20 mg total) by mouth daily. 07/08/17  Yes Hackney, Tina A, FNP  sacubitril-valsartan (ENTRESTO) 49-51 MG Take 1 tablet by mouth 2 (two) times daily. 10/01/17  Yes Minna Merritts, MD  spironolactone (ALDACTONE) 25 MG tablet Take 1 tablet (25 mg total) by mouth daily. 03/04/18  Yes Alisa Graff, FNP  metFORMIN (GLUCOPHAGE)  500 MG tablet Take 1 tablet (500 mg total) by mouth 2 (two) times daily with a meal. 05/21/18   Carrie Mew, MD     Allergies Patient has no known allergies.   Family History  Problem Relation Age of Onset  . Lung cancer Mother   . Hypertension Father   . CAD Father        a. MI age 11  . COPD Neg Hx   .  Diabetes Mellitus II Neg Hx     Social History Social History   Tobacco Use  . Smoking status: Never Smoker  . Smokeless tobacco: Never Used  Substance Use Topics  . Alcohol use: No  . Drug use: No    Review of Systems  Constitutional:   No fever or chills.  ENT:   No sore throat. No rhinorrhea. Cardiovascular:   No chest pain or syncope. Respiratory:   No dyspnea or cough. Gastrointestinal:   Negative for abdominal pain, vomiting and diarrhea.  Musculoskeletal:   Negative for focal pain or swelling All other systems reviewed and are negative except as documented above in ROS and HPI.  ____________________________________________   PHYSICAL EXAM:  VITAL SIGNS: ED Triage Vitals  Enc Vitals Group     BP 05/21/18 1100 123/86     Pulse Rate 05/21/18 1100 89     Resp 05/21/18 1100 14     Temp 05/21/18 1108 98 F (36.7 C)     Temp Source 05/21/18 1108 Oral     SpO2 05/21/18 1100 100 %     Weight 05/21/18 1109 110 lb (49.9 kg)     Height 05/21/18 1109 4\' 11"  (1.499 m)     Head Circumference --      Peak Flow --      Pain Score 05/21/18 1109 0     Pain Loc --      Pain Edu? --      Excl. in [place]? --     Vital signs reviewed, nursing assessments reviewed.   Constitutional:   Alert and oriented. Non-toxic appearance. Eyes:   Conjunctivae are normal. EOMI. PERRL. ENT      Head:   Normocephalic and atraumatic.      Nose:   No congestion/rhinnorhea.       Mouth/Throat:   Dry mucous membranes, no pharyngeal erythema. No peritonsillar mass.       Neck:   No meningismus. Full ROM. Hematological/Lymphatic/Immunilogical:   No cervical lymphadenopathy. Cardiovascular:   RRR. Symmetric bilateral radial and DP pulses.  No murmurs. Cap refill less than 2 seconds. Respiratory:   Normal respiratory effort without tachypnea/retractions. Breath sounds are clear and equal bilaterally. No wheezes/rales/rhonchi. Gastrointestinal:   Soft and nontender. Non distended. There is no CVA  tenderness.  No rebound, rigidity, or guarding.  Musculoskeletal:   Normal range of motion in all extremities. No joint effusions.  No lower extremity tenderness.  No edema. Neurologic:   Normal speech and language.  Motor grossly intact. No acute focal neurologic deficits are appreciated.  Skin:    Skin is warm, dry and intact. No rash noted.  No petechiae, purpura, or bullae.  ____________________________________________    LABS (pertinent positives/negatives) (all labs ordered are listed, but only abnormal results are displayed) Labs Reviewed  GLUCOSE, CAPILLARY - Abnormal; Notable for the following components:      Result Value   Glucose-Capillary 475 (*)    All other components within normal limits  COMPREHENSIVE METABOLIC PANEL - Abnormal; Notable for the  following components:   Sodium 131 (*)    Potassium 2.2 (*)    Chloride 94 (*)    Glucose, Bld 496 (*)    Creatinine, Ser 1.60 (*)    Calcium 7.9 (*)    GFR calc non Af Amer 36 (*)    GFR calc Af Amer 42 (*)    All other components within normal limits  CBC WITH DIFFERENTIAL/PLATELET - Abnormal; Notable for the following components:   WBC 12.5 (*)    Neutro Abs 9.7 (*)    All other components within normal limits  BLOOD GAS, VENOUS - Abnormal; Notable for the following components:   Bicarbonate 29.4 (*)    Acid-Base Excess 2.8 (*)    All other components within normal limits  GLUCOSE, CAPILLARY - Abnormal; Notable for the following components:   Glucose-Capillary 438 (*)    All other components within normal limits  URINE CULTURE  LIPASE, BLOOD  URINALYSIS, COMPLETE (UACMP) WITH MICROSCOPIC  BASIC METABOLIC PANEL   ____________________________________________   EKG    ____________________________________________    RADIOLOGY  Dg Chest Portable 1 View  Result Date: 05/21/2018 CLINICAL DATA:  Cough, hyperglycemia EXAM: PORTABLE CHEST 1 VIEW COMPARISON:  10/23/2017 FINDINGS: Heart and mediastinal  contours are within normal limits. No focal opacities or effusions. No acute bony abnormality. IMPRESSION: No active cardiopulmonary disease. Electronically Signed   By: Rolm Baptise M.D.   On: 05/21/2018 12:32    ____________________________________________   PROCEDURES Procedures  ____________________________________________    CLINICAL IMPRESSION / ASSESSMENT AND PLAN / ED COURSE  Pertinent labs & imaging results that were available during my care of the patient were reviewed by me and considered in my medical decision making (see chart for details).    Patient presents with hyperglycemia of about 500.  Possible DKA although patient is overall nontoxic, vital signs are unremarkable, and patient states that she feels relatively well.  Will give IV fluids for hydration, check labs.  Clinical Course as of May 22 1535  Wed May 21, 3293  1884 Metabolic panel shows AKI, hypokalemia to 2.2.  No acidosis. Doesn't need insulin drip.   With her hyperglycemia and complicated lab findings, will need to aggressively hydrate and monitor potassium.  May need to hospitalize to manage.  Comprehensive metabolic panel(!!) [PS]    Clinical Course User Index [PS] Carrie Mew, MD    ----------------------------------------- 3:39 PM on 05/21/2018 -----------------------------------------  Patient states that she feels much better after IV fluids and K-Vescent.  Vital signs remain normal.  Offered admission but patient feels like she could go home.  Will give additional IV fluids, regular insulin, potassium and recheck metabolic panel.  If glucose is controlled she can be discharged and I would restart metformin and have her follow-up closely with primary care.  Care of patient signed out to Dr. Archie Balboa to reassess.   ____________________________________________   FINAL CLINICAL IMPRESSION(S) / ED DIAGNOSES    Final diagnoses:  Hyperglycemia  Dehydration  Hypokalemia     ED  Discharge Orders         Ordered    metFORMIN (GLUCOPHAGE) 500 MG tablet  2 times daily with meals     05/21/18 1536          Portions of this note were generated with dragon dictation software. Dictation errors may occur despite best attempts at proofreading.   Carrie Mew, MD 05/21/18 1540

## 2018-05-21 NOTE — ED Notes (Signed)
Pt denies any pain at this time.

## 2018-05-21 NOTE — ED Notes (Signed)
ED TO INPATIENT HANDOFF REPORT  Name/Age/Gender Erin Hayes 53 y.o. female  Code Status Code Status History    Date Active Date Inactive Code Status Order ID Comments User Context   04/03/2017 1955 04/08/2017 1631 Full Code [id number]  Henreitta Leber, MD Inpatient   02/19/2017 0203 02/20/2017 1627 Full Code [id number]  Saundra Shelling, MD ED   10/31/2016 2159 11/07/2016 1741 Full Code [id number]  Lance Coon, MD Inpatient   04/26/2013 0140 04/29/2013 2157 Full Code [id number]  Guy Begin, MD Inpatient      Home/SNF/Other home  Chief Complaint blurred vision  Level of Care/Admitting Diagnosis ED Disposition    ED Disposition Condition [place]: [facility] [100120]  Level of Care: Med-Surg [16]  Diagnosis: Hypokalemia [867619]  Admitting Physician: Saundra Shelling [[id number]]  Attending Physician: Saundra Shelling [[id number]]  Estimated length of stay: past midnight tomorrow  Certification:: I certify this patient will need inpatient services for at least 2 midnights  PT Class (Do Not Modify): Inpatient [101]  PT Acc Code (Do Not Modify): Private [1]       Medical History Past Medical History:  Diagnosis Date  . Carpal tunnel syndrome of right wrist   . Chronic combined systolic (congestive) and diastolic (congestive) heart failure ([place])    a. 03/2017 Echo: EF 20-25%, diff HK, Gr2 DD.  . Diabetes ([place])   . GERD (gastroesophageal reflux disease)   . HLD (hyperlipidemia)   . Hypertension   . NICM (nonischemic cardiomyopathy) ([place])    a. 03/2017 Echo: EF 20-25%, diff HK, Gr2 DD, mild to mod MR, nl RV fxn;  b. 03/2017 Cath: mild nonobs dzs, EF 25%.  . Pleural effusion, left    a. 03/2017 s/p thoracentesis-->300 ml withdrawn--transudative.  . Pneumonia 02/19/2017    Allergies No Known Allergies  IV Location/Drains/Wounds Patient Lines/Drains/Airways Status   Active Line/Drains/Airways    Name:   Placement date:    Placement time:   Site:   Days:   Peripheral IV 05/21/18 Left;Anterior Forearm   05/21/18    1653    Forearm   less than 1   Peripheral IV 05/21/18 Left Forearm   05/21/18    1654    Forearm   less than 1          Labs/Imaging Results for orders placed or performed during the hospital encounter of 05/21/18 (from the past 48 hour(s))  Glucose, capillary     Status: Abnormal   Collection Time: 05/21/18 11:01 AM  Result Value Ref Range   Glucose-Capillary 475 (H) 70 - 99 mg/dL  Comprehensive metabolic panel     Status: Abnormal   Collection Time: 05/21/18 11:11 AM  Result Value Ref Range   Sodium 131 (L) 135 - 145 mmol/L   Potassium 2.2 (LL) 3.5 - 5.1 mmol/L    Comment: CRITICAL RESULT CALLED TO, READ BACK BY AND VERIFIED WITH YESSICA AGUAS AT 1201 ON  05/21/18 [place].    Chloride 94 (L) 98 - 111 mmol/L   CO2 22 22 - 32 mmol/L   Glucose, Bld 496 (H) 70 - 99 mg/dL   BUN 13 6 - 20 mg/dL   Creatinine, Ser 1.60 (H) 0.44 - 1.00 mg/dL   Calcium 7.9 (L) 8.9 - 10.3 mg/dL   Total Protein 7.0 6.5 - 8.1 g/dL   Albumin 3.8 3.5 - 5.0 g/dL   AST 17 15 - 41 U/L   ALT 19 0 - 44 U/L  Alkaline Phosphatase 113 38 - 126 U/L   Total Bilirubin 1.0 0.3 - 1.2 mg/dL   GFR calc non Af Amer 36 (L) >60 mL/min   GFR calc Af Amer 42 (L) >60 mL/min   Anion gap 15 5 - 15    Comment: Performed at [facility], [address] [place], [place] [address]  Lipase, blood     Status: None   Collection Time: 05/21/18 11:11 AM  Result Value Ref Range   Lipase 35 11 - 51 U/L    Comment: Performed at [facility], [address] [place], [place] [address]  CBC with Differential     Status: Abnormal   Collection Time: 05/21/18 11:11 AM  Result Value Ref Range   WBC 12.5 (H) 4.0 - 10.5 K/uL   RBC 4.75 3.87 - 5.11 MIL/uL   Hemoglobin 13.2 12.0 - 15.0 g/dL   HCT 39.7 36.0 - 46.0 %   MCV 83.6 80.0 - 100.0 fL   MCH 27.8 26.0 - 34.0 pg   MCHC 33.2 30.0 - 36.0 g/dL   RDW 12.0 11.5 - 15.5 %    Platelets 355 150 - 400 K/uL   nRBC 0.0 0.0 - 0.2 %   Neutrophils Relative % 77 %   Neutro Abs 9.7 (H) 1.7 - 7.7 K/uL   Lymphocytes Relative 15 %   Lymphs Abs 1.9 0.7 - 4.0 K/uL   Monocytes Relative 6 %   Monocytes Absolute 0.7 0.1 - 1.0 K/uL   Eosinophils Relative 0 %   Eosinophils Absolute 0.0 0.0 - 0.5 K/uL   Basophils Relative 1 %   Basophils Absolute 0.1 0.0 - 0.1 K/uL   Immature Granulocytes 1 %   Abs Immature Granulocytes 0.07 0.00 - 0.07 K/uL    Comment: Performed at [facility], [address] [place], [place] [address]  Blood gas, venous     Status: Abnormal (Preliminary result)   Collection Time: 05/21/18 11:14 AM  Result Value Ref Range   pH, Ven 7.36 7.250 - 7.430   pCO2, Ven 52 44.0 - 60.0 mmHg   pO2, Ven PENDING 32.0 - 45.0 mmHg   Bicarbonate 29.4 (H) 20.0 - 28.0 mmol/L   Acid-Base Excess 2.8 (H) 0.0 - 2.0 mmol/L   O2 Saturation 26.9 %   Patient temperature 37.0    Collection site VEIN    Sample type VENOUS     Comment: Performed at [facility], [address] [place], [place] [address]  Glucose, capillary     Status: Abnormal   Collection Time: 05/21/18 12:54 PM  Result Value Ref Range   Glucose-Capillary 438 (H) 70 - 99 mg/dL  Basic metabolic panel     Status: Abnormal   Collection Time: 05/21/18  3:36 PM  Result Value Ref Range   Sodium 134 (L) 135 - 145 mmol/L   Potassium 2.6 (LL) 3.5 - 5.1 mmol/L    Comment: CRITICAL RESULT CALLED TO, READ BACK BY AND VERIFIED WITH YESSICA AGUAS AT 1722 ON 05/21/18 [place].    Chloride 102 98 - 111 mmol/L   CO2 20 (L) 22 - 32 mmol/L   Glucose, Bld 404 (H) 70 - 99 mg/dL   BUN 13 6 - 20 mg/dL   Creatinine, Ser 1.32 (H) 0.44 - 1.00 mg/dL   Calcium 7.5 (L) 8.9 - 10.3 mg/dL   GFR calc non Af Amer 46 (L) >60 mL/min   GFR calc Af Amer 53 (L) >60 mL/min   Anion gap  12 5 - 15    Comment: Performed at [facility], [address] [place], [place] [address]  Magnesium     Status: Abnormal    Collection Time: 05/21/18  3:36 PM  Result Value Ref Range   Magnesium 1.4 (L) 1.7 - 2.4 mg/dL    Comment: Performed at [facility], [address] [place], [place] [address]  Glucose, capillary     Status: Abnormal   Collection Time: 05/21/18  3:41 PM  Result Value Ref Range   Glucose-Capillary 387 (H) 70 - 99 mg/dL  Glucose, capillary     Status: Abnormal   Collection Time: 05/21/18  6:03 PM  Result Value Ref Range   Glucose-Capillary 356 (H) 70 - 99 mg/dL   Dg Chest Portable 1 View  Result Date: 05/21/2018 CLINICAL DATA:  Cough, hyperglycemia EXAM: PORTABLE CHEST 1 VIEW COMPARISON:  10/23/2017 FINDINGS: Heart and mediastinal contours are within normal limits. No focal opacities or effusions. No acute bony abnormality. IMPRESSION: No active cardiopulmonary disease. Electronically Signed   By: Rolm Baptise M.D.   On: 05/21/2018 12:32    Pending Labs Unresulted Labs (From admission, onward)    Start     Ordered   05/21/18 1114  Urinalysis, Complete w Microscopic  ONCE - STAT,   STAT     05/21/18 1113   05/21/18 1114  Urine culture  Once,   STAT     05/21/18 1113   Signed and Held  HIV antibody (Routine Testing)  Once,   R     Signed and Held   Signed and Held  Basic metabolic panel  Tomorrow morning,   R     Signed and Held   Signed and Held  CBC  Tomorrow morning,   R     Signed and Held   Signed and Held  CBC  (enoxaparin (LOVENOX)    CrCl >/= 30 ml/min)  Once,   R    Comments:  Baseline for enoxaparin therapy IF NOT ALREADY DRAWN.  Notify MD if PLT < 100 K.    Signed and Held   Signed and Held  Creatinine, serum  (enoxaparin (LOVENOX)    CrCl >/= 30 ml/min)  Once,   R    Comments:  Baseline for enoxaparin therapy IF NOT ALREADY DRAWN.    Signed and Held   Signed and Held  Creatinine, serum  (enoxaparin (LOVENOX)    CrCl >/= 30 ml/min)  Weekly,   R    Comments:  while on enoxaparin therapy    Signed and Held          Vitals/Pain Today's Vitals    05/21/18 1227 05/21/18 1230 05/21/18 1530 05/21/18 1841  BP:  (!) 136/91 106/76 (!) 133/93  Pulse:   90 94  Resp:  15 16 (!) 28  Temp:      TempSrc:      SpO2:   95% 100%  Weight:      Height:      PainSc: 0-No pain       Isolation Precautions No active isolations  Medications Medications  insulin aspart (novoLOG) injection 0-20 Units (has no administration in time range)  insulin aspart (novoLOG) injection 0-5 Units (has no administration in time range)  insulin aspart (novoLOG) injection 4 Units (has no administration in time range)  0.9 % NaCl with KCl 20 mEq/ L  infusion (has no administration in time range)  sodium chloride 0.9 % bolus 1,000 mL (0 mLs  Intravenous Stopped 05/21/18 1521)  potassium chloride (KLOR-CON) packet 40 mEq (40 mEq Oral Given 05/21/18 1247)  sodium chloride 0.9 % bolus 1,000 mL (0 mLs Intravenous Stopped 05/21/18 1809)  potassium chloride (KLOR-CON) packet 40 mEq (40 mEq Oral Given 05/21/18 1706)  insulin aspart (novoLOG) injection 10 Units (10 Units Intravenous Given 05/21/18 1708)    Mobility independent

## 2018-05-21 NOTE — ED Notes (Signed)
Admitting MD at bedside.

## 2018-05-21 NOTE — ED Notes (Signed)
IV team at bedside at this time. 

## 2018-05-21 NOTE — ED Notes (Signed)
NAD noted at this time. Pt given warm blanket per her request. Will continue to monitor for further patient needs.

## 2018-05-21 NOTE — ED Notes (Signed)
IV team consult placed. This Rn and Vicente Males, RN attempted multiple times for IV without success. Will hold medications until IV access established.

## 2018-05-21 NOTE — ED Notes (Signed)
Pt given meal tray at this time 

## 2018-05-21 NOTE — ED Triage Notes (Signed)
Pt presents to ED via [facility] from [place]. Per EMS pt c/o feeling "sick" x 1 week with N/V x 2-3 days prior to today. Upon EMS arrival CBG 548, after approx 100cc's fluid CBG 531, upon arrival to ED CBG 475. Pt is alert and oriented, denies any pain at this time. Per EMS pt also c/o intermittent blurry vision x several days. Pt denies clurry vision at this time.

## 2018-05-22 LAB — CBC
HCT: 32.6 % — ABNORMAL LOW (ref 36.0–46.0)
Hemoglobin: 10.7 g/dL — ABNORMAL LOW (ref 12.0–15.0)
MCH: 28 pg (ref 26.0–34.0)
MCHC: 32.8 g/dL (ref 30.0–36.0)
MCV: 85.3 fL (ref 80.0–100.0)
Platelets: 245 10*3/uL (ref 150–400)
RBC: 3.82 MIL/uL — ABNORMAL LOW (ref 3.87–5.11)
RDW: 12.4 % (ref 11.5–15.5)
WBC: 8.9 10*3/uL (ref 4.0–10.5)
nRBC: 0 % (ref 0.0–0.2)

## 2018-05-22 LAB — BASIC METABOLIC PANEL
Anion gap: 7 (ref 5–15)
BUN: 11 mg/dL (ref 6–20)
CO2: 19 mmol/L — ABNORMAL LOW (ref 22–32)
CREATININE: 1.13 mg/dL — AB (ref 0.44–1.00)
Calcium: 7 mg/dL — ABNORMAL LOW (ref 8.9–10.3)
Chloride: 110 mmol/L (ref 98–111)
GFR calc Af Amer: >60 mL/min (ref >60)
GFR calc non Af Amer: 55 mL/min — ABNORMAL LOW (ref >60)
Glucose, Bld: 325 mg/dL — ABNORMAL HIGH (ref 70–99)
Potassium: 3.2 mmol/L — ABNORMAL LOW (ref 3.5–5.1)
Sodium: 136 mmol/L (ref 135–145)

## 2018-05-22 LAB — GLUCOSE, CAPILLARY
Glucose-Capillary: 313 mg/dL — ABNORMAL HIGH (ref 70–99)
Glucose-Capillary: 284 mg/dL — ABNORMAL HIGH (ref 70–99)
Glucose-Capillary: 90 mg/dL (ref 70–99)
Glucose-Capillary: 340 mg/dL — ABNORMAL HIGH (ref 70–99)

## 2018-05-22 LAB — MAGNESIUM: Magnesium: 1.3 mg/dL — ABNORMAL LOW (ref 1.7–2.4)

## 2018-05-22 LAB — HEMOGLOBIN A1C
HEMOGLOBIN A1C: 14.9 % — AB (ref 4.8–5.6)
Mean Plasma Glucose: 380.93 mg/dL

## 2018-05-22 MED ORDER — LIVING WELL WITH DIABETES BOOK
Freq: Once | Status: AC
  Administered 2018-05-22: 11:00:00
  Filled 2018-05-22: qty 1

## 2018-05-22 MED ORDER — INSULIN STARTER KIT- SYRINGES (ENGLISH)
1.0000 | Freq: Once | Status: DC
  Filled 2018-05-22: qty 1

## 2018-05-22 MED ORDER — INSULIN GLARGINE 100 UNIT/ML ~~LOC~~ SOLN
10.0000 [IU] | Freq: Every day | SUBCUTANEOUS | Status: DC
  Administered 2018-05-22: 10 [IU] via SUBCUTANEOUS
  Filled 2018-05-22: qty 0.1

## 2018-05-22 NOTE — Progress Notes (Addendum)
Inpatient Diabetes Program Recommendations  AACE/ADA: New Consensus Statement on Inpatient Glycemic Control   Target Ranges:  Prepandial:   less than 140 mg/dL      Peak postprandial:   less than 180 mg/dL (1-2 hours)      Critically ill patients:  140 - 180 mg/dL   Results for Erin Hayes, Erin Hayes (MRN [id number]) as of 05/22/2018 10:25  Ref. Range 05/21/2018 11:01 05/21/2018 12:54 05/21/2018 15:41 05/21/2018 18:03 05/21/2018 20:09 05/22/2018 07:43  Glucose-Capillary Latest Ref Range: 70 - 99 mg/dL 475 (H) 438 (H) 387 (H) 356 (H) 364 (H) 313 (H)   Review of Glycemic Control  Diabetes history: DM2 Outpatient Diabetes medications: None; diet controlled Current orders for Inpatient glycemic control: Novolog 0-20 units TID with meals, Novolog 0-5 units QHS, Novolog 4 units TID with meals  Inpatient Diabetes Program Recommendations:  Insulin - Basal: Please consider ordering Lantus 10 units Q24H starting now.  HbgA1C: Please consider ordering an A1C (add on to blood in lab) to evaluate glycemic control over the past 2-3 months.  NOTE: In reviewing chart, noted patient has DM2 hx but does not take any DM medications as an outpatient. Patient presented to the hospital due to hyperglycemia and initial glucose 475 mg/dl on 05/21/18. Patient does not have any insurance and last seen T. Jackelyn Hoehn, [place] at [facility] on 04/02/2018. In reviewing notes in [place], patient's A1C was >14% on 05/13/13 and was started on 70/30 35 units BID, Metformin 1000 mg BID, and Glipizide 10 mg BID on 05/13/13. Currently there are not any DM medications listed on home medication list.  Anticipate patient will require outpatient DM medications for DM control. Ordered Living Well with DM book and will plan to talk with patient today.  Addendum 05/22/18@12 :7: Spoke with patient about diabetes and home regimen for diabetes control. Patient reports that she is followed by [facility] and gets medications from [facility]) for diabetes management and currently she is not taking any medications for DM control. Patient does not have a glucometer at home and has not been checking glucose at home at all. Patient has been given a glucometer and testing supplies by CM which she has at bedside. Inquired about prior DM medications taken in the past and patient reports that she has taken Metformin and she has taken insulin via insulin pen in the past.  Discussed glucose and A1C goals. No current A1C but initial glucose 475 mg/dl on presentation. Fasting glucose 313 mg/dl today despite receiving a total of Novolog 15 units for correction on 05/21/18.  Explained that patient will likely be discharged on insulin for DM control. Patient is agreeable to take insulin as an outpatient and she notes that her daughter takes insulin as well.  Discussed importance of checking CBGs and maintaining good CBG control to prevent long-term and short-term complications. Explained how hyperglycemia leads to damage within blood vessels which lead to the common complications seen with uncontrolled diabetes. Stressed to the patient the importance of improving glycemic control to prevent further complications from uncontrolled diabetes. Discussed impact of nutrition, exercise, stress, sickness, and medications on diabetes control. Discussed Lantus and Novolog insulin as ordered and also discussed 70/30 insulin which is more affordable. Discussed 70/30 in more detail and how it is typically given BID (with breakfast and supper). Patient states that she would prefer to use an insulin pen.  Reviewed and demonstrated how to use an insulin pen. Patient was able to successfully demonstrate how to  use an insulin pen. Informed patient that [facility] would be called to see which insulins they have on hand so that the correct insulin will be prescribed at discharge. Encouraged patient to check her glucose 3-4 times per day (before meals and at bedtime) and  to keep a log book of glucose readings and insulin taken which she will need to take to doctor appointments. Explained how the doctor she follows up with can use the log book to continue to make insulin adjustments if needed. Asked patient to make an appointment to [facility] next [facility].   Patient verbalized understanding of information discussed and she states that she has no further questions at this time related to diabetes.   Called [facility] and they have Humulin 70/30 (in vials only). Therefore, will ask that patient be discharged on Humulin 70/30 (vials). Informed patient that [facility] only has 70/30 available in vials and patient is agreeable to use the vial/syringe.  Will ask bedside nursing to educate on how to use vial/syringe for insulin injections.    Recommend discharging patient on 70/30 10 units BID (this dose will provide a total of 14 units for basal and 6 units for meal coverage).  At time of discharge, please provide Rx for: Humulin 70/30 (vials) (# 3676) 10 units BID and insulin syringes (#70177).   Thanks, Barnie Alderman, RN, MSN, CDE Diabetes Coordinator Inpatient Diabetes Program [phone] (Team Pager from 8am to 5pm)

## 2018-05-22 NOTE — Plan of Care (Signed)

## 2018-05-22 NOTE — Progress Notes (Signed)
[place] [place] NAME: Erin Hayes    MR#:  [id number]  DATE OF BIRTH:  06-09-1965  SUBJECTIVE:   Patient here due to nausea vomiting and diarrhea noted to be severely hypokalemic.  Improving with supplementation. Still having some diarrhea. BS are a bit uncontrolled and pt. Is presently on no meds.    REVIEW OF SYSTEMS:    Review of Systems  Constitutional: Negative for chills and fever.  HENT: Negative for congestion and tinnitus.   Eyes: Negative for blurred vision and double vision.  Respiratory: Negative for cough, shortness of breath and wheezing.   Cardiovascular: Negative for chest pain, orthopnea and PND.  Gastrointestinal: Positive for diarrhea. Negative for abdominal pain, nausea and vomiting.  Genitourinary: Negative for dysuria and hematuria.  Neurological: Negative for dizziness, sensory change and focal weakness.  All other systems reviewed and are negative.   Nutrition: Heart Healthy Tolerating Diet: Yes Tolerating PT: Ambulatory     DRUG ALLERGIES:  No Known Allergies  VITALS:  Blood pressure 104/80, pulse 91, temperature 99.2 F (37.3 C), temperature source Oral, resp. rate 18, height 4\' 11"  (1.499 m), weight 47.9 kg, SpO2 99 %.  PHYSICAL EXAMINATION:   Physical Exam  GENERAL:  53 y.o.-year-old patient lying in bed in no acute distress.  EYES: Pupils equal, round, reactive to light and accommodation. No scleral icterus. Extraocular muscles intact.  HEENT: Head atraumatic, normocephalic. Oropharynx and nasopharynx clear.  NECK:  Supple, no jugular venous distention. No thyroid enlargement, no tenderness.  LUNGS: Normal breath sounds bilaterally, no wheezing, rales, rhonchi. No use of accessory muscles of respiration.  CARDIOVASCULAR: S1, S2 normal. No murmurs, rubs, or gallops.  ABDOMEN: Soft, nontender, nondistended. Bowel sounds present. No organomegaly or mass.  EXTREMITIES: No cyanosis, clubbing  or edema b/l.    NEUROLOGIC: Cranial nerves II through XII are intact. No focal Motor or sensory deficits b/l.   PSYCHIATRIC: The patient is alert and oriented x 3.  SKIN: No obvious rash, lesion, or ulcer.    LABORATORY PANEL:   CBC Recent Labs  Lab 05/22/18 0540  WBC 8.9  HGB 10.7*  HCT 32.6*  PLT 245   ------------------------------------------------------------------------------------------------------------------  Chemistries  Recent Labs  Lab 05/21/18 1111 05/21/18 1536 05/22/18 0540  NA 131* 134* 136  K 2.2* 2.6* 3.2*  CL 94* 102 110  CO2 22 20* 19*  GLUCOSE 496* 404* 325*  BUN 13 13 11   CREATININE 1.60* 1.32* 1.13*  CALCIUM 7.9* 7.5* 7.0*  MG  --  1.4*  --   AST 17  --   --   ALT 19  --   --   ALKPHOS 113  --   --   BILITOT 1.0  --   --    ------------------------------------------------------------------------------------------------------------------  Cardiac Enzymes No results for input(s): TROPONINI in the last 168 hours. ------------------------------------------------------------------------------------------------------------------  RADIOLOGY:  Dg Chest Portable 1 View  Result Date: 05/21/2018 CLINICAL DATA:  Cough, hyperglycemia EXAM: PORTABLE CHEST 1 VIEW COMPARISON:  10/23/2017 FINDINGS: Heart and mediastinal contours are within normal limits. No focal opacities or effusions. No acute bony abnormality. IMPRESSION: No active cardiopulmonary disease. Electronically Signed   By: Rolm Baptise M.D.   On: 05/21/2018 12:32     ASSESSMENT AND PLAN:   53 year old female with past medical history of hypertension, hyperlipidemia, nonischemic cardiomyopathy, history of chronic combined diastolic systolic CHF, chronic diarrhea who presented to the hospital due to nausea vomiting and diarrhea and noted  to be hypokalemic.  1.  Acute hypokalemia- secondary to the nausea vomiting and diarrhea. -Much improved with supplementation and will cont. To monitor.  -  Mg level was low and will repeat this a.m   2.  Nausea vomiting and diarrhea-patient's nausea vomiting has resolved and she is tolerating p.o. well.  She continues to have some loose stools.  She has a history of chronic diarrhea and has been seen by gastroenterology in the past.  Recently not on any antibiotics or laxatives. - I will check stool for comprehensive culture and C. Difficile.  3.  Diabetes type 2 with hyperglycemia-patient presently not on any medications.  She used to be on metformin but was stopped due to patient having some diarrhea. - Appreciate diabetes coordinator input.  Hemoglobin A1c is as high as 10.  Patient likely needs to be on insulin. - We will start patient on Lantus for now, continue sliding scale insulin, await for the diabetes coordinator input regarding what insulin to discharge the patient on.  4.  History of chronic combined diastolic systolic CHF.  Clinically patient is not in congestive heart failure. -Hold Lasix, Aldactone given the hypokalemia.  Continue carvedilol, Entresto.   All the records are reviewed and case discussed with Care Management/Social Worker. Management plans discussed with the patient, family and they are in agreement.  CODE STATUS: Full code  DVT Prophylaxis: Lovenox  TOTAL TIME TAKING CARE OF THIS PATIENT: 30 minutes.   POSSIBLE D/C IN 1-2 DAYS, DEPENDING ON CLINICAL CONDITION.   Henreitta Leber M.D on 05/22/2018 at 1:32 PM  Between 7am to 6pm - Pager - [phone]  After 6pm go to [email or web address] - Proofreader  [employer] [facility]  Office  [phone]  CC: Primary care physician; Staci Acosta, NP

## 2018-05-22 NOTE — Clinical Social Work Note (Signed)
CSW received consult for needs with medication. CSW notified RNCM of this consult. No CSW needs at this time. Please reconsult if further needs arise.   [place], [place]

## 2018-05-22 NOTE — Care Management (Addendum)
RNCM team consulted since patient is without payer. [facility] and utilizes the medication management for prescriptions. Patients only need at this time is a glucometer. Provided one from [facility] donations.   Spoke with MD and diabetic coordinator who will plan of d/c tomorrow with scripts for twice a day novolog  70/30. Confirmed this to be on formulary for [facility].

## 2018-05-23 ENCOUNTER — Other Ambulatory Visit: Payer: Self-pay | Admitting: Family

## 2018-05-23 LAB — GASTROINTESTINAL PANEL BY PCR, STOOL (REPLACES STOOL CULTURE)

## 2018-05-23 LAB — C DIFFICILE QUICK SCREEN W PCR REFLEX
C Diff antigen: NEGATIVE
C Diff interpretation: NOT DETECTED
C Diff toxin: NEGATIVE

## 2018-05-23 LAB — BASIC METABOLIC PANEL
Anion gap: 6 (ref 5–15)
BUN: 13 mg/dL (ref 6–20)
CO2: 17 mmol/L — ABNORMAL LOW (ref 22–32)
Calcium: 7.4 mg/dL — ABNORMAL LOW (ref 8.9–10.3)
Chloride: 110 mmol/L (ref 98–111)
Creatinine, Ser: 1.04 mg/dL — ABNORMAL HIGH (ref 0.44–1.00)
GFR calc Af Amer: >60 mL/min (ref >60)
GFR calc non Af Amer: >60 mL/min (ref >60)
Glucose, Bld: 378 mg/dL — ABNORMAL HIGH (ref 70–99)
POTASSIUM: 3.9 mmol/L (ref 3.5–5.1)
Sodium: 133 mmol/L — ABNORMAL LOW (ref 135–145)

## 2018-05-23 LAB — GLUCOSE, CAPILLARY
Glucose-Capillary: 304 mg/dL — ABNORMAL HIGH (ref 70–99)
Glucose-Capillary: 279 mg/dL — ABNORMAL HIGH (ref 70–99)

## 2018-05-23 LAB — MAGNESIUM: Magnesium: 1.4 mg/dL — ABNORMAL LOW (ref 1.7–2.4)

## 2018-05-23 LAB — HIV ANTIBODY (ROUTINE TESTING W REFLEX): HIV Screen 4th Generation wRfx: NONREACTIVE

## 2018-05-23 MED ORDER — INSULIN STARTER KIT- SYRINGES (ENGLISH): 1.0000 | Freq: Once | 0 refills | Status: AC

## 2018-05-23 MED ORDER — INSULIN ASPART PROT & ASPART (70-30 MIX) 100 UNIT/ML ~~LOC~~ SUSP
20.0000 [IU] | Freq: Two times a day (BID) | SUBCUTANEOUS | Status: DC
  Filled 2018-05-23: qty 10

## 2018-05-23 MED ORDER — INSULIN ASPART PROT & ASPART (70-30 MIX) 100 UNIT/ML ~~LOC~~ SUSP: 20.0000 [IU] | Freq: Two times a day (BID) | SUBCUTANEOUS | 1 refills | Status: DC

## 2018-05-23 MED ORDER — CIPROFLOXACIN HCL 250 MG PO TABS: 250.0000 mg | ORAL_TABLET | Freq: Two times a day (BID) | ORAL | 0 refills | Status: AC

## 2018-05-23 MED ORDER — INSULIN NPH ISOPHANE & REGULAR (70-30) 100 UNIT/ML ~~LOC~~ SUSP: 20.0000 [IU] | Freq: Two times a day (BID) | SUBCUTANEOUS | 3 refills | Status: DC

## 2018-05-23 MED ORDER — POTASSIUM CHLORIDE ER 20 MEQ PO TBCR: 10.0000 meq | EXTENDED_RELEASE_TABLET | Freq: Every day | ORAL | 0 refills | Status: DC

## 2018-05-23 NOTE — Progress Notes (Addendum)
Inpatient Diabetes Program Recommendations  AACE/ADA: New Consensus Statement on Inpatient Glycemic Control   Target Ranges:  Prepandial:   less than 140 mg/dL      Peak postprandial:   less than 180 mg/dL (1-2 hours)      Critically ill patients:  140 - 180 mg/dL  Results for Erin Hayes, Erin Hayes (MRN [id number]) as of 05/23/2018 07:42  Ref. Range 05/23/2018 04:39  Glucose Latest Ref Range: 70 - 99 mg/dL 378 (H)   Results for [name], [name] (MRN [id number]) as of 05/23/2018 07:42  Ref. Range 05/22/2018 07:43 05/22/2018 12:05 05/22/2018 16:35 05/22/2018 21:46  Glucose-Capillary Latest Ref Range: 70 - 99 mg/dL 313 (H) 284 (H) 90 340 (H)  Results for [name], [name] (MRN [id number]) as of 05/23/2018 07:42  Ref. Range 05/22/2018 05:40  Hemoglobin A1C Latest Ref Range: 4.8 - 5.6 % 14.9 (H)   Review of Glycemic Control Diabetes history: DM2 Outpatient Diabetes medications: None; diet controlled Current orders for Inpatient glycemic control: Lantus 10 units QHS, Novolog 0-20 units TID with meals, Novolog 0-5 units QHS, Novolog 4 units TID with meals  Inpatient Diabetes Program Recommendations:   Insulin: Please consider discontinuing Lantus and Novolog 4 units TID for meal coverage and ordering 70/30 20 units BID (will provide 28 units for basal and 12 units for meal coverage per day).   HbgA1C: A1C 14.9% on 05/22/18 indicating an average glucose of 381 mg/dl over the past 2-3 months. Recommend discharging on Humulin 70/30 20 units BID. At time of discharge, please provide Rx for: Humulin 70/30 (vials) (# 3676) and insulin syringes (#[id number]).   Addendum 05/23/18@11 :86- Spoke with patient again regarding insulin. Informed patient that she will be discharging on Humulin 70/30 (vial/syringe) since Medication Management only has it in vials.  Patient is agreeable to using vial/syringe. Reviewed 70/30 insulin and reviewed and demonstrated how to draw up and administer insulin SQ with vial/syringe. Patient was able  to successfully demonstrate how to draw up and administer insulin with vial/syringe. Encouraged patient to check glucose 4 times a day (before meals and at bedtime), to make an appointment with [facility] for follow up next week (if appointment has not already been made for her), and to take medication as prescribed. Patient verbalized understanding of information and states that she has no questions related to DM or insulin at this time.  Thanks, Barnie Alderman, RN, MSN, CDE Diabetes Coordinator Inpatient Diabetes Program [phone] (Team Pager from 8am to 5pm)

## 2018-05-23 NOTE — Discharge Summary (Signed)
[facility] [place] NAME: Erin Hayes    MR#:  [id number]  DATE OF BIRTH:  [birth year]  DATE OF ADMISSION:  05/21/2018 ADMITTING PHYSICIAN: Saundra Shelling, MD  DATE OF DISCHARGE: 05/23/2018  PRIMARY CARE PHYSICIAN: Doles-Johnson, Teah, NP    ADMISSION DIAGNOSIS:  Dehydration [E86.0] Hypokalemia [E87.6] Hyperglycemia [R73.9]  DISCHARGE DIAGNOSIS:  Active Problems:   Hypokalemia   SECONDARY DIAGNOSIS:   Past Medical History:  Diagnosis Date  . Carpal tunnel syndrome of right wrist   . Chronic combined systolic (congestive) and diastolic (congestive) heart failure ([place])    a. 03/2017 Echo: EF 20-25%, diff HK, Gr2 DD.  . Diabetes ([place])   . GERD (gastroesophageal reflux disease)   . HLD (hyperlipidemia)   . Hypertension   . NICM (nonischemic cardiomyopathy) ([place])    a. 03/2017 Echo: EF 20-25%, diff HK, Gr2 DD, mild to mod MR, nl RV fxn;  b. 03/2017 Cath: mild nonobs dzs, EF 25%.  . Pleural effusion, left    a. 03/2017 s/p thoracentesis-->300 ml withdrawn--transudative.  . Pneumonia 02/19/2017    HOSPITAL COURSE:   53 year old female with past medical history of hypertension, hyperlipidemia, nonischemic cardiomyopathy, history of chronic combined diastolic systolic CHF, chronic diarrhea who presented to the hospital due to nausea vomiting and diarrhea and noted to be hypokalemic.  1.  Acute hypokalemia- secondary to the nausea vomiting and diarrhea. -This has improved and resolved with supplementation.  Patient is being discharged on some low dose potassium for additional few days.  Patient is already on Aldactone which would help raise his potassium level.  2.  Nausea vomiting and diarrhea- this was the cause of patient's worsening hypokalemia.  Patient has chronic diarrhea but had gotten worse recently.  Her stool for C. difficile was negative. - She has had an extensive work-up for her diarrhea as an outpatient through  gastroenterology will continue follow-up there.  She can continue Imodium as needed.   3.  Diabetes type 2 with hyperglycemia- presented to the hospital with significantly uncontrolled blood sugars.  She used to be on metformin but it was discontinued due to her diarrhea.  Her A1c was as high as 10.  I diabetes coordinator consult was obtained.  They recommended starting the patient on insulin. -Patient is presently being discharged on Humulin 70/30 20 units twice daily.  This was arranged with the help of med management clinic.    4.  History of chronic combined diastolic systolic CHF.  Clinically patient was not in congestive heart failure while in the hospital.  -Her diuretics were initially held due to hypokalemia but she will resume her Lasix, Aldactone, carvedilol, Entresto upon discharge.  5.  Urinary tract infection-based off a urinalysis and positive urine culture.  Clinically patient is afebrile and hemodynamically stable. -She will be discharged on a 3-day course of ciprofloxacin.  DISCHARGE CONDITIONS:   Stable.   CONSULTS OBTAINED:    DRUG ALLERGIES:  No Known Allergies  DISCHARGE MEDICATIONS:   Allergies as of 05/23/2018   No Known Allergies     Medication List    TAKE these medications   albuterol 108 (90 Base) MCG/ACT inhaler Commonly known as:  PROVENTIL HFA;VENTOLIN HFA Inhale 2 puffs into the lungs every 4 (four) hours as needed for wheezing or shortness of breath.   aspirin 81 MG tablet Take 81 mg by mouth daily.   carvedilol 6.25 MG tablet Commonly known as:  COREG Take 1 tablet (6.25 mg total) by  mouth 2 (two) times daily.   ciprofloxacin 250 MG tablet Commonly known as:  CIPRO Take 1 tablet (250 mg total) by mouth 2 (two) times daily for 3 days.   furosemide 20 MG tablet Commonly known as:  LASIX Take 1 tablet (20 mg total) by mouth daily.   insulin NPH-regular Human (70-30) 100 UNIT/ML injection Commonly known as:  NOVOLIN 70/30 Inject 20  Units into the skin 2 (two) times daily with a meal.   insulin starter kit- syringes Misc 1 kit by Other route once for 1 dose.   Potassium Chloride ER 20 MEQ Tbcr Take 10 mEq by mouth daily for 5 days.   sacubitril-valsartan 49-51 MG Commonly known as:  ENTRESTO Take 1 tablet by mouth 2 (two) times daily.   spironolactone 25 MG tablet Commonly known as:  ALDACTONE Take 1 tablet (25 mg total) by mouth daily.         DISCHARGE INSTRUCTIONS:   DIET:  Cardiac diet and Diabetic diet  DISCHARGE CONDITION:  Stable  ACTIVITY:  Activity as tolerated  OXYGEN:  Home Oxygen: No.   Oxygen Delivery: room air  DISCHARGE LOCATION:  home   If you experience worsening of your admission symptoms, develop shortness of breath, life threatening emergency, suicidal or homicidal thoughts you must seek medical attention immediately by calling 911 or calling your MD immediately  if symptoms less severe.  You Must read complete instructions/literature along with all the possible adverse reactions/side effects for all the Medicines you take and that have been prescribed to you. Take any new Medicines after you have completely understood and accpet all the possible adverse reactions/side effects.   Please note  You were cared for by a hospitalist during your hospital stay. If you have any questions about your discharge medications or the care you received while you were in the hospital after you are discharged, you can call the unit and asked to speak with the hospitalist on call if the hospitalist that took care of you is not available. Once you are discharged, your primary care physician will handle any further medical issues. Please note that NO REFILLS for any discharge medications will be authorized once you are discharged, as it is imperative that you return to your primary care physician (or establish a relationship with a primary care physician if you do not have one) for your aftercare  needs so that they can reassess your need for medications and monitor your lab values.     Today   Diarrhea much improved since yesterday.  Stool for C. difficile was negative.  Potassium level has improved with supplementation.  Blood sugars are also stable.  Will discharge on insulin therapy today.  VITAL SIGNS:  Blood pressure 109/82, pulse 85, temperature 98 F (36.7 C), temperature source Oral, resp. rate 18, height _0  (1.499 m), weight 47.9 kg, SpO2 99 %.  I/O:    Intake/Output Summary (Last 24 hours) at 05/23/2018 1409 Last data filed at 05/23/2018 0700 Gross per 24 hour  Intake 728.74 ml  Output -  Net 728.74 ml    PHYSICAL EXAMINATION:   GENERAL:  53 y.o.-year-old patient lying in bed in no acute distress.  EYES: Pupils equal, round, reactive to light and accommodation. No scleral icterus. Extraocular muscles intact.  HEENT: Head atraumatic, normocephalic. Oropharynx and nasopharynx clear.  NECK:  Supple, no jugular venous distention. No thyroid enlargement, no tenderness.  LUNGS: Normal breath sounds bilaterally, no wheezing, rales, rhonchi. No use of accessory muscles  of respiration.  CARDIOVASCULAR: S1, S2 normal. No murmurs, rubs, or gallops.  ABDOMEN: Soft, nontender, nondistended. Bowel sounds present. No organomegaly or mass.  EXTREMITIES: No cyanosis, clubbing or edema b/l.    NEUROLOGIC: Cranial nerves II through XII are intact. No focal Motor or sensory deficits b/l.   PSYCHIATRIC: The patient is alert and oriented x 3.  SKIN: No obvious rash, lesion, or ulcer.   DATA REVIEW:   CBC Recent Labs  Lab 05/22/18 0540  WBC 8.9  HGB 10.7*  HCT 32.6*  PLT 245    Chemistries  Recent Labs  Lab 05/21/18 1111  05/23/18 0439  NA 131*   < > 133*  K 2.2*   < > 3.9  CL 94*   < > 110  CO2 22   < > 17*  GLUCOSE 496*   < > 378*  BUN 13   < > 13  CREATININE 1.60*   < > 1.04*  CALCIUM 7.9*   < > 7.4*  MG  --    < > 1.4*  AST 17  --   --   ALT 19  --    --   ALKPHOS 113  --   --   BILITOT 1.0  --   --    < > = values in this interval not displayed.    Cardiac Enzymes No results for input(s): TROPONINI in the last 168 hours.  Microbiology Results  Results for orders placed or performed during the hospital encounter of 05/21/18  Urine culture     Status: Abnormal (Preliminary result)   Collection Time: 05/21/18  9:52 PM  Result Value Ref Range Status   Specimen Description URINE, RANDOM  Final   Special Requests   Final    NONE Performed at [facility], [address] [place], [place] [address]    Culture >=100,000 COLONIES/mL ESCHERICHIA COLI (A)  Final   Report Status PENDING  Incomplete  Gastrointestinal Panel by PCR , Stool     Status: None   Collection Time: 05/23/18  6:15 AM  Result Value Ref Range Status   Campylobacter species NOT DETECTED NOT DETECTED Final   Plesimonas shigelloides NOT DETECTED NOT DETECTED Final   Salmonella species NOT DETECTED NOT DETECTED Final   Yersinia enterocolitica NOT DETECTED NOT DETECTED Final   Vibrio species NOT DETECTED NOT DETECTED Final   Vibrio cholerae NOT DETECTED NOT DETECTED Final   Enteroaggregative E coli (EAEC) NOT DETECTED NOT DETECTED Final   Enteropathogenic E coli (EPEC) NOT DETECTED NOT DETECTED Final   Enterotoxigenic E coli (ETEC) NOT DETECTED NOT DETECTED Final   Shiga like toxin producing E coli (STEC) NOT DETECTED NOT DETECTED Final   Shigella/Enteroinvasive E coli (EIEC) NOT DETECTED NOT DETECTED Final   Cryptosporidium NOT DETECTED NOT DETECTED Final   Cyclospora cayetanensis NOT DETECTED NOT DETECTED Final   Entamoeba histolytica NOT DETECTED NOT DETECTED Final   Giardia lamblia NOT DETECTED NOT DETECTED Final   Adenovirus F40/41 NOT DETECTED NOT DETECTED Final   Astrovirus NOT DETECTED NOT DETECTED Final   Norovirus GI/GII NOT DETECTED NOT DETECTED Final   Rotavirus A NOT DETECTED NOT DETECTED Final   Sapovirus (I, II, IV, and V) NOT DETECTED NOT  DETECTED Final    Comment: Performed at [facility], [address] [place], [place] [address]  C difficile quick scan w PCR reflex     Status: None   Collection Time: 05/23/18  6:15 AM  Result Value Ref  Range Status   C Diff antigen NEGATIVE NEGATIVE Final   C Diff toxin NEGATIVE NEGATIVE Final   C Diff interpretation No C. difficile detected.  Final    Comment: Performed at [facility], [address] [place], [place] [address]    RADIOLOGY:  No results found.    Management plans discussed with the patient, family and they are in agreement.  CODE STATUS:     Code Status Orders  (From admission, onward)         Start     Ordered   05/21/18 1956  Full code  Continuous     05/21/18 1955          TOTAL TIME TAKING CARE OF THIS PATIENT: 40 minutes.    Henreitta Leber M.D on 05/23/2018 at 2:09 PM  Between 7am to 6pm - Pager - [phone]  After 6pm go to [email or web address] - Proofreader  [facility]  Office  [phone]  CC: Primary care physician; Staci Acosta, NP

## 2018-05-23 NOTE — Care Management (Signed)
Faxed discharge scripts to [facility] 4[facility]30p [facility]. Instructed patient and primary nurse of need to pick up before 4:30p

## 2018-05-24 LAB — URINE CULTURE

## 2018-05-28 ENCOUNTER — Telehealth: Payer: Self-pay

## 2018-05-28 NOTE — Telephone Encounter (Signed)
EMMI Follow-up: Noted on the report that the patient had not read her discharge papers yet and responded no to scheduled follow-up appointments.  I talked with Erin Hayes and she said she has now read her discharge paperwork and sees she does have some follow-up appointments scheduled and plans to go to them.  Said she is doing great and no needs noted for today. I let her know there would be a second follow-up call with a different series of questions and to let us know if she has any concerns at that time.

## 2018-05-29 ENCOUNTER — Ambulatory Visit: Payer: Medicaid Other | Attending: Family | Admitting: Family

## 2018-05-29 ENCOUNTER — Telehealth: Payer: Self-pay

## 2018-05-29 ENCOUNTER — Encounter: Payer: Self-pay | Admitting: Pharmacist

## 2018-05-29 ENCOUNTER — Encounter: Payer: Self-pay | Admitting: Family

## 2018-05-29 VITALS — BP 150/96 | HR 100 | Resp 18 | Ht 59.0 in | Wt 111.4 lb

## 2018-05-29 DIAGNOSIS — I5042 Chronic combined systolic (congestive) and diastolic (congestive) heart failure: Secondary | ICD-10-CM | POA: Diagnosis present

## 2018-05-29 DIAGNOSIS — Z7982 Long term (current) use of aspirin: Secondary | ICD-10-CM | POA: Diagnosis not present

## 2018-05-29 DIAGNOSIS — E876 Hypokalemia: Secondary | ICD-10-CM | POA: Diagnosis not present

## 2018-05-29 DIAGNOSIS — E1165 Type 2 diabetes mellitus with hyperglycemia: Secondary | ICD-10-CM

## 2018-05-29 DIAGNOSIS — I2510 Atherosclerotic heart disease of native coronary artery without angina pectoris: Secondary | ICD-10-CM | POA: Diagnosis not present

## 2018-05-29 DIAGNOSIS — K219 Gastro-esophageal reflux disease without esophagitis: Secondary | ICD-10-CM | POA: Diagnosis not present

## 2018-05-29 DIAGNOSIS — I10 Essential (primary) hypertension: Secondary | ICD-10-CM

## 2018-05-29 DIAGNOSIS — Z794 Long term (current) use of insulin: Secondary | ICD-10-CM | POA: Insufficient documentation

## 2018-05-29 DIAGNOSIS — I5022 Chronic systolic (congestive) heart failure: Secondary | ICD-10-CM

## 2018-05-29 DIAGNOSIS — I110 Hypertensive heart disease with heart failure: Secondary | ICD-10-CM | POA: Insufficient documentation

## 2018-05-29 DIAGNOSIS — Z8249 Family history of ischemic heart disease and other diseases of the circulatory system: Secondary | ICD-10-CM | POA: Diagnosis not present

## 2018-05-29 DIAGNOSIS — E785 Hyperlipidemia, unspecified: Secondary | ICD-10-CM | POA: Insufficient documentation

## 2018-05-29 DIAGNOSIS — Z79899 Other long term (current) drug therapy: Secondary | ICD-10-CM | POA: Diagnosis not present

## 2018-05-29 DIAGNOSIS — E119 Type 2 diabetes mellitus without complications: Secondary | ICD-10-CM | POA: Insufficient documentation

## 2018-05-29 MED ORDER — SACUBITRIL-VALSARTAN 49-51 MG PO TABS: 1.0000 | ORAL_TABLET | Freq: Two times a day (BID) | ORAL | 3 refills | Status: DC

## 2018-05-29 NOTE — Patient Instructions (Addendum)
Continue weighing daily and call for an overnight weight gain of > 2 pounds or a weekly weight gain of >5 pounds.  Take your medications before coming to your appointment so that we can evaluate how well they are working with your blood pressure.  Bring medication bottles to every visit.

## 2018-05-29 NOTE — Progress Notes (Signed)
Patient ID: Erin Hayes, female    DOB: [birth year], 53 y.o.   MRN: [id number]  HPI  Erin Hayes is a 53 y/o female with a history of diabetes, GERD, hyperlipidemia, HTN, pneumonia and chronic heart failure.   Echo report from 09/25/17 reviewed and showed an EF of 20-25% along with mild MR. Echo report from 04/04/17 reviewed and showed an EF of 20-25% along with a left pleural effusion.   Cardiac catheterization done 04/05/17 and showed EF of 25% along with severely elevated LV end diastolic pressure along with mild nonobstructive CAD.  Admitted 05/21/18 due to hypokalemia due to n/v/d. Negative for C.diff. Given hydration. Given antibiotics due to UTI. Discharged after 2 days.    She presents today for a follow-up visit with a chief complaint of minimal fatigue upon moderate exertion. She describes this as chronic in nature having been present for several years although this has recently gotten better. She has associated rhinorrhea along with this. She denies any difficulty sleeping, abdominal distention, palpitations, pedal edema, chest pain, shortness of breath, cough or weight gain. Overall, feels much better since her recent admission. She has not taken any of her medications yet today because she hasn't eaten anything yet.   Past Medical History:  Diagnosis Date  . Carpal tunnel syndrome of right wrist   . Chronic combined systolic (congestive) and diastolic (congestive) heart failure ([place])    a. 03/2017 Echo: EF 20-25%, diff HK, Gr2 DD.  . Diabetes ([place])   . GERD (gastroesophageal reflux disease)   . HLD (hyperlipidemia)   . Hypertension   . NICM (nonischemic cardiomyopathy) ([place])    a. 03/2017 Echo: EF 20-25%, diff HK, Gr2 DD, mild to mod MR, nl RV fxn;  b. 03/2017 Cath: mild nonobs dzs, EF 25%.  . Pleural effusion, left    a. 03/2017 s/p thoracentesis-->300 ml withdrawn--transudative.  . Pneumonia 02/19/2017   Past Surgical History:  Procedure Laterality Date  . COLONOSCOPY WITH  PROPOFOL N/A 01/21/2017   Procedure: COLONOSCOPY WITH PROPOFOL;  Surgeon: Jonathon Bellows, MD;  Location: [facility] ENDOSCOPY;  Service: Gastroenterology;  Laterality: N/A;  . ESOPHAGOGASTRODUODENOSCOPY (EGD) WITH PROPOFOL N/A 01/21/2017   Procedure: ESOPHAGOGASTRODUODENOSCOPY (EGD) WITH PROPOFOL;  Surgeon: Jonathon Bellows, MD;  Location: [facility] ENDOSCOPY;  Service: Gastroenterology;  Laterality: N/A;  . FLEXIBLE SIGMOIDOSCOPY N/A 11/04/2016   Procedure: FLEXIBLE SIGMOIDOSCOPY;  Surgeon: Wilford Corner, MD;  Location: [facility] ENDOSCOPY;  Service: Endoscopy;  Laterality: N/A;  . LEFT HEART CATH AND CORONARY ANGIOGRAPHY N/A 04/05/2017   Procedure: LEFT HEART CATH AND CORONARY ANGIOGRAPHY;  Surgeon: Wellington Hampshire, MD;  Location: [facility] CV LAB;  Service: Cardiovascular;  Laterality: N/A;  . LITHOTRIPSY     Family History  Problem Relation Age of Onset  . Lung cancer Mother   . Hypertension Father   . CAD Father        a. MI age 10  . COPD Neg Hx   . Diabetes Mellitus II Neg Hx    Social History   Tobacco Use  . Smoking status: Never Smoker  . Smokeless tobacco: Never Used  Substance Use Topics  . Alcohol use: No   No Known Allergies  Prior to Admission medications   Medication Sig Start Date End Date Taking? Authorizing Provider  albuterol (PROVENTIL HFA;VENTOLIN HFA) 108 (90 Base) MCG/ACT inhaler Inhale 2 puffs into the lungs every 4 (four) hours as needed for wheezing or shortness of breath. 04/26/17  Yes Alisa Graff, FNP  aspirin 81 MG  tablet Take 81 mg by mouth daily.   Yes [provider]  carvedilol (COREG) 6.25 MG tablet Take 1 tablet (6.25 mg total) by mouth 2 (two) times daily. 04/02/18  Yes , Otila Kluver A, FNP  furosemide (LASIX) 20 MG tablet Take 1 tablet (20 mg total) by mouth daily. 07/08/17  Yes , Otila Kluver A, FNP  insulin NPH-regular Human (NOVOLIN 70/30) (70-30) 100 UNIT/ML injection Inject 20 Units into the skin 2 (two) times daily with a meal. 05/23/18  Yes  Sainani, Belia Heman, MD  sacubitril-valsartan (ENTRESTO) 49-51 MG Take 1 tablet by mouth 2 (two) times daily. 05/29/18  Yes Darylene Price A, FNP  spironolactone (ALDACTONE) 25 MG tablet Take 1 tablet (25 mg total) by mouth daily. 03/04/18  Yes , Otila Kluver A, FNP  potassium chloride 20 MEQ TBCR Take 10 mEq by mouth daily for 5 days. 05/23/18 05/28/18  Henreitta Leber, MD    Review of Systems  Constitutional: Positive for fatigue (with moderate exertion). Negative for appetite change and fever.  HENT: Positive for rhinorrhea. Negative for congestion and sore throat.   Eyes: Negative for pain.  Respiratory: Negative for cough, chest tightness, shortness of breath and wheezing.   Cardiovascular: Negative for chest pain, palpitations and leg swelling.  Gastrointestinal: Negative for abdominal distention and abdominal pain.  Endocrine: Negative.   Genitourinary: Negative.   Musculoskeletal: Negative for back pain and neck pain.  Skin: Positive for rash ("bug bites around ankles").  Allergic/Immunologic: Negative.   Neurological: Positive for headaches. Negative for dizziness and light-headedness.  Hematological: Negative for adenopathy. Does not bruise/bleed easily.  Psychiatric/Behavioral: Negative for dysphoric mood and sleep disturbance (sleeping on 3 pillows for comfort). The patient is not nervous/anxious.    Vitals:   05/29/18 1251 05/29/18 1308  BP: (!) 151/110 (!) 150/96  Pulse: 100   Resp: 18   SpO2: 90%   Weight: 111 lb 6 oz (50.5 kg)   Height: 4\' 11"  (1.499 m)    Wt Readings from Last 3 Encounters:  05/29/18 111 lb 6 oz (50.5 kg)  05/21/18 105 lb 9.6 oz (47.9 kg)  04/02/18 113 lb (51.3 kg)   Lab Results  Component Value Date   CREATININE 1.04 (H) 05/23/2018   CREATININE 1.13 (H) 05/22/2018   CREATININE 1.32 (H) 05/21/2018    Physical Exam  Constitutional: She is oriented to person, place, and time. She appears well-developed and well-nourished.  HENT:  Head:  Normocephalic and atraumatic.  Neck: Normal range of motion. Neck supple. No JVD present.  Cardiovascular: Regular rhythm. Tachycardia present.  Pulmonary/Chest: Effort normal. She has no wheezes. She has no rales.  Abdominal: Soft. She exhibits no distension. There is no abdominal tenderness.  Musculoskeletal:        General: No tenderness or edema.  Neurological: She is alert and oriented to person, place, and time.  Skin: Skin is warm and dry. Rash noted. Rash is urticarial (bilateral lower legs (patient denies itching)).  Psychiatric: She has a normal mood and affect. Her behavior is normal. Thought content normal.  Nursing note and vitals reviewed.   Assessment & Plan:  1: Chronic heart failure with reduced ejection fraction- - NYHA class II - euvolemic today - not weighing daily because she says that her scales are "acting up". She replaced the battery but it still doesn't work. Scales given to her today and she was reminded to call for an overnight weight gain of >2 pounds or a weekly weight gain of >5 pounds -  weight down 2 pounds from last visit here 2 months ago - not adding salt to her food and she was reminded to keep daily sodium intake to 2000mg /day;  - discussed [employer] with patient due to low EF; patient is interested and [employer] rep talked personally with the patient. Order will be placed for lifevest - saw cardiology Rockey Situ) 08/22/17 - BNP 04/03/17 was 1287.0  2: HTN- - BP elevated but she says that she hasn't taken any of her medications yet today because she hasn't eaten yet - explained that I needed to see her after she's taken her medications to evaluate the BP effectiveness to see if we can titrate up medications - BMP from 05/23/2018 reviewed and showed sodium 133, potassium 3.9, creatinine 1.04 and GFR >60 - went to [facility] 03/27/18  3: DM- - A1c 05/22/2018 was 14.9%    Patient did not bring her medications nor a list. Each medication was verbally  reviewed with the patient and she was encouraged to bring the bottles to every visit to confirm accuracy of list.  Return in 2 weeks or sooner for any questions/problems before then.

## 2018-05-29 NOTE — Telephone Encounter (Signed)
EMMI Follow-up: Noted on the report from the second automated call that the patient responded no for scheduled follow-up appointments.  I talked with Ms. Strom and asked if she was aware of the follow-up appointment with [facility] and [facility] along with some Lab work and other appointments.  She said she was aware of all appointments and no needs noted for today. Thanked me for calling.

## 2018-06-03 ENCOUNTER — Telehealth: Payer: Self-pay | Admitting: Adult Health Nurse Practitioner

## 2018-06-03 ENCOUNTER — Ambulatory Visit: Payer: Medicaid Other

## 2018-06-03 NOTE — Telephone Encounter (Signed)
Left message to reschedule appt 1/28 at 2:00 pm for same night.

## 2018-06-07 NOTE — Progress Notes (Signed)
Patient ID: Erin Hayes, female    DOB: 1965/08/16, 53 y.o.   MRN: [id number]  HPI  Erin Hayes is a 53 y/o female with a history of diabetes, GERD, hyperlipidemia, HTN, pneumonia and chronic heart failure.   Echo report from 09/25/17 reviewed and showed an EF of 20-25% along with mild MR. Echo report from 04/04/17 reviewed and showed an EF of 20-25% along with a left pleural effusion.   Cardiac catheterization done 04/05/17 and showed EF of 25% along with severely elevated LV end diastolic pressure along with mild nonobstructive CAD.  Admitted 05/21/18 due to hypokalemia due to n/v/d. Negative for C.diff. Given hydration. Given antibiotics due to UTI. Discharged after 2 days.    She presents today for a follow-up visit with a chief complaint of minimal fatigue upon moderate exertion. She describes this as chronic in nature having been present for several years. She has associated headaches along with this. She denies any difficulty sleeping, dizziness, abdominal distention, palpitations, pedal edema, chest pain, shortness of breath, cough or weight gain. She did take her medications prior to coming to her appointment today.   Past Medical History:  Diagnosis Date  . Carpal tunnel syndrome of right wrist   . Chronic combined systolic (congestive) and diastolic (congestive) heart failure ([place])    a. 03/2017 Echo: EF 20-25%, diff HK, Gr2 DD.  . Diabetes ([place])   . GERD (gastroesophageal reflux disease)   . HLD (hyperlipidemia)   . Hypertension   . NICM (nonischemic cardiomyopathy) ([place])    a. 03/2017 Echo: EF 20-25%, diff HK, Gr2 DD, mild to mod MR, nl RV fxn;  b. 03/2017 Cath: mild nonobs dzs, EF 25%.  . Pleural effusion, left    a. 03/2017 s/p thoracentesis-->300 ml withdrawn--transudative.  . Pneumonia 02/19/2017   Past Surgical History:  Procedure Laterality Date  . COLONOSCOPY WITH PROPOFOL N/A 01/21/2017   Procedure: COLONOSCOPY WITH PROPOFOL;  Surgeon: Jonathon Bellows, MD;  Location:  [facility] ENDOSCOPY;  Service: Gastroenterology;  Laterality: N/A;  . ESOPHAGOGASTRODUODENOSCOPY (EGD) WITH PROPOFOL N/A 01/21/2017   Procedure: ESOPHAGOGASTRODUODENOSCOPY (EGD) WITH PROPOFOL;  Surgeon: Jonathon Bellows, MD;  Location: [facility] ENDOSCOPY;  Service: Gastroenterology;  Laterality: N/A;  . FLEXIBLE SIGMOIDOSCOPY N/A 11/04/2016   Procedure: FLEXIBLE SIGMOIDOSCOPY;  Surgeon: Wilford Corner, MD;  Location: [facility] ENDOSCOPY;  Service: Endoscopy;  Laterality: N/A;  . LEFT HEART CATH AND CORONARY ANGIOGRAPHY N/A 04/05/2017   Procedure: LEFT HEART CATH AND CORONARY ANGIOGRAPHY;  Surgeon: Wellington Hampshire, MD;  Location: [place] CV LAB;  Service: Cardiovascular;  Laterality: N/A;  . LITHOTRIPSY     Family History  Problem Relation Age of Onset  . Lung cancer Mother   . Hypertension Father   . CAD Father        a. MI age 39  . COPD Neg Hx   . Diabetes Mellitus II Neg Hx    Social History   Tobacco Use  . Smoking status: Never Smoker  . Smokeless tobacco: Never Used  Substance Use Topics  . Alcohol use: No   No Known Allergies  Prior to Admission medications   Medication Sig Start Date End Date Taking? Authorizing Provider  albuterol (PROVENTIL HFA;VENTOLIN HFA) 108 (90 Base) MCG/ACT inhaler Inhale 2 puffs into the lungs every 4 (four) hours as needed for wheezing or shortness of breath. 04/26/17  Yes Darylene Price A, FNP  aspirin 81 MG tablet Take 81 mg by mouth daily.   Yes [provider]  carvedilol (COREG) 6.25  MG tablet Take 1 tablet (6.25 mg total) by mouth 2 (two) times daily. 04/02/18  Yes , Otila Kluver A, FNP  furosemide (LASIX) 20 MG tablet Take 1 tablet (20 mg total) by mouth daily. 06/09/18  Yes , Otila Kluver A, FNP  insulin NPH-regular Human (NOVOLIN 70/30) (70-30) 100 UNIT/ML injection Inject 20 Units into the skin 2 (two) times daily with a meal. 05/23/18  Yes Sainani, Belia Heman, MD  sacubitril-valsartan (ENTRESTO) 49-51 MG Take 1 tablet by mouth 2 (two) times daily.  05/29/18  Yes Darylene Price A, FNP  spironolactone (ALDACTONE) 25 MG tablet Take 1 tablet (25 mg total) by mouth daily. 03/04/18  Yes , Otila Kluver A, FNP  potassium chloride 20 MEQ TBCR Take 10 mEq by mouth daily for 5 days. Patient not taking: Reported on 06/09/2018 05/23/18 06/09/18  Henreitta Leber, MD    Review of Systems  Constitutional: Positive for fatigue (with moderate exertion). Negative for appetite change and fever.  HENT: Positive for rhinorrhea. Negative for congestion and sore throat.   Eyes: Negative for pain.  Respiratory: Negative for cough, chest tightness, shortness of breath and wheezing.   Cardiovascular: Negative for chest pain, palpitations and leg swelling.  Gastrointestinal: Negative for abdominal distention and abdominal pain.  Endocrine: Negative.   Genitourinary: Negative.   Musculoskeletal: Negative for back pain and neck pain.  Skin: Positive for rash ("bug bites around ankles").  Allergic/Immunologic: Negative.   Neurological: Positive for headaches. Negative for dizziness and light-headedness.  Hematological: Negative for adenopathy. Does not bruise/bleed easily.  Psychiatric/Behavioral: Negative for dysphoric mood and sleep disturbance (sleeping on 3 pillows for comfort). The patient is not nervous/anxious.    Vitals:   06/09/18 1324  BP: 120/89  Pulse: 91  Resp: 18  SpO2: 100%  Weight: 113 lb (51.3 kg)  Height: 4\' 11"  (1.499 m)   Wt Readings from Last 3 Encounters:  06/09/18 113 lb (51.3 kg)  05/29/18 111 lb 6 oz (50.5 kg)  05/21/18 105 lb 9.6 oz (47.9 kg)   Lab Results  Component Value Date   CREATININE 1.04 (H) 05/23/2018   CREATININE 1.13 (H) 05/22/2018   CREATININE 1.32 (H) 05/21/2018    Physical Exam  Constitutional: She is oriented to person, place, and time. She appears well-developed and well-nourished.  HENT:  Head: Normocephalic and atraumatic.  Neck: Normal range of motion. Neck supple. No JVD present.  Cardiovascular: Normal  rate and regular rhythm.  Pulmonary/Chest: Effort normal. She has no wheezes. She has no rales.  Abdominal: Soft. She exhibits no distension. There is no abdominal tenderness.  Musculoskeletal:        General: No tenderness or edema.  Neurological: She is alert and oriented to person, place, and time.  Skin: Skin is warm and dry. Rash noted. Rash is urticarial (bilateral lower legs (patient denies itching)).  Psychiatric: She has a normal mood and affect. Her behavior is normal. Thought content normal.  Nursing note and vitals reviewed.   Assessment & Plan:  1: Chronic heart failure with reduced ejection fraction- - NYHA class II - euvolemic today - now weighing daily and she was reminded to call for an overnight weight gain of >2 pounds or a weekly weight gain of >5 pounds - weight up 2 pounds since her last visit here 2 weeks ago - not adding salt to her food and she was reminded to keep daily sodium intake to 2000mg /day - will increase her carvedilol to 12.5mg  BID; advised her to finish her current dose  of 6.25mg  by taking 2 tablets BID until gone and then begin the 12.5mg  tablet as 1 tablet BID - consider titrating carvedilol/entresto at future visits - awaiting on email from [employer] rep to see if patient can afford it - saw cardiology Rockey Situ) 08/22/17 - BNP 04/03/17 was 1287.0  2: HTN- - BP looks good today as she took her medication prior to coming into the office - BMP from 05/23/2018 reviewed and showed sodium 133, potassium 3.9, creatinine 1.04 and GFR >60 - went to [facility] 03/27/18  3: DM- - A1c 05/22/2018 was 14.9% - glucose at home this morning was 81    Patient did not bring her medications nor a list. Each medication was verbally reviewed with the patient and she was encouraged to bring the bottles to every visit to confirm accuracy of list.   Return in 1 month or sooner for any questions/problems before then.

## 2018-06-09 ENCOUNTER — Ambulatory Visit: Payer: Medicaid Other | Attending: Family | Admitting: Family

## 2018-06-09 ENCOUNTER — Encounter: Payer: Self-pay | Admitting: Family

## 2018-06-09 VITALS — BP 120/89 | HR 91 | Resp 18 | Ht 59.0 in | Wt 113.0 lb

## 2018-06-09 DIAGNOSIS — Z7982 Long term (current) use of aspirin: Secondary | ICD-10-CM | POA: Insufficient documentation

## 2018-06-09 DIAGNOSIS — I5042 Chronic combined systolic (congestive) and diastolic (congestive) heart failure: Secondary | ICD-10-CM | POA: Diagnosis present

## 2018-06-09 DIAGNOSIS — I110 Hypertensive heart disease with heart failure: Secondary | ICD-10-CM | POA: Diagnosis not present

## 2018-06-09 DIAGNOSIS — E119 Type 2 diabetes mellitus without complications: Secondary | ICD-10-CM | POA: Insufficient documentation

## 2018-06-09 DIAGNOSIS — Z801 Family history of malignant neoplasm of trachea, bronchus and lung: Secondary | ICD-10-CM | POA: Insufficient documentation

## 2018-06-09 DIAGNOSIS — I2510 Atherosclerotic heart disease of native coronary artery without angina pectoris: Secondary | ICD-10-CM | POA: Diagnosis not present

## 2018-06-09 DIAGNOSIS — Z794 Long term (current) use of insulin: Secondary | ICD-10-CM | POA: Insufficient documentation

## 2018-06-09 DIAGNOSIS — I10 Essential (primary) hypertension: Secondary | ICD-10-CM

## 2018-06-09 DIAGNOSIS — Z79899 Other long term (current) drug therapy: Secondary | ICD-10-CM | POA: Insufficient documentation

## 2018-06-09 DIAGNOSIS — I5022 Chronic systolic (congestive) heart failure: Secondary | ICD-10-CM

## 2018-06-09 DIAGNOSIS — Z8249 Family history of ischemic heart disease and other diseases of the circulatory system: Secondary | ICD-10-CM | POA: Insufficient documentation

## 2018-06-09 MED ORDER — CARVEDILOL 12.5 MG PO TABS: 12.5000 mg | ORAL_TABLET | Freq: Two times a day (BID) | ORAL | 3 refills | Status: DC

## 2018-06-09 MED ORDER — FUROSEMIDE 20 MG PO TABS: 20.0000 mg | ORAL_TABLET | Freq: Every day | ORAL | 3 refills | Status: DC

## 2018-06-09 NOTE — Patient Instructions (Addendum)
Continue weighing daily and call for an overnight weight gain of > 2 pounds or a weekly weight gain of >5 pounds.  Increase carvedilol by taking 2 tablets twice daily until you finish your current prescription. When you pick [facility] your [facility], you will then start taking it as 1 tablet twice daily as the 12.5mg  dose.

## 2018-06-10 ENCOUNTER — Ambulatory Visit: Payer: Medicaid Other

## 2018-06-10 ENCOUNTER — Encounter: Payer: Self-pay | Admitting: Family

## 2018-06-10 LAB — BLOOD GAS, VENOUS
ACID-BASE EXCESS: 2.8 mmol/L — AB (ref 0.0–2.0)
BICARBONATE: 29.4 mmol/L — AB (ref 20.0–28.0)
O2 Saturation: 26.9 %
Patient temperature: 37
pCO2, Ven: 52 mmHg (ref 44.0–60.0)
pH, Ven: 7.36 (ref 7.250–7.430)

## 2018-06-12 ENCOUNTER — Ambulatory Visit: Payer: Medicaid Other

## 2018-06-19 ENCOUNTER — Ambulatory Visit: Payer: Medicaid Other | Admitting: Adult Health Nurse Practitioner

## 2018-06-19 ENCOUNTER — Other Ambulatory Visit: Payer: Medicaid Other

## 2018-06-19 VITALS — BP 160/101 | HR 88 | Temp 98.5°F | Ht 59.0 in | Wt 115.0 lb

## 2018-06-19 DIAGNOSIS — E0800 Diabetes mellitus due to underlying condition with hyperosmolarity without nonketotic hyperglycemic-hyperosmolar coma (NKHHC): Secondary | ICD-10-CM

## 2018-06-19 DIAGNOSIS — E119 Type 2 diabetes mellitus without complications: Secondary | ICD-10-CM

## 2018-06-19 MED ORDER — INSULIN NPH ISOPHANE & REGULAR (70-30) 100 UNIT/ML ~~LOC~~ SUSP: SUBCUTANEOUS | 3 refills | Status: DC

## 2018-06-19 NOTE — Progress Notes (Signed)
  Patient: Erin Hayes Female    DOB: 1965-06-10   53 y.o.   MRN: [id number] Visit Date: 06/19/2018  Today's Provider: Staci Acosta, NP   Chief Complaint  Patient presents with  . Follow-up    finger   Subjective:    HPI  Went to the hospital on 1.15 for hyperglycemia. States the she has had a few episodes of hypoglycemia since being out of the hospital- states she has cut down her nighttime insulin (NPH) to 10 units instead of 20 units. Currently taking 20u in the am and 10u at night. A1c in the hospital was 14.9 up from 6.8 a year ago.   BP has been running good. Just saw Darylene Price- HF clinic and BP was 120/89 at visit. Recently increased Carvedilol.     No Known Allergies Previous Medications   ALBUTEROL (PROVENTIL HFA;VENTOLIN HFA) 108 (90 BASE) MCG/ACT INHALER    Inhale 2 puffs into the lungs every 4 (four) hours as needed for wheezing or shortness of breath.   ASPIRIN 81 MG TABLET    Take 81 mg by mouth daily.   CARVEDILOL (COREG) 12.5 MG TABLET    Take 1 tablet (12.5 mg total) by mouth 2 (two) times daily.   FUROSEMIDE (LASIX) 20 MG TABLET    Take 1 tablet (20 mg total) by mouth daily.   INSULIN NPH-REGULAR HUMAN (NOVOLIN 70/30) (70-30) 100 UNIT/ML INJECTION    Inject 20 Units into the skin 2 (two) times daily with a meal.   POTASSIUM CHLORIDE 20 MEQ TBCR    Take 10 mEq by mouth daily for 5 days.   SACUBITRIL-VALSARTAN (ENTRESTO) 49-51 MG    Take 1 tablet by mouth 2 (two) times daily.   SPIRONOLACTONE (ALDACTONE) 25 MG TABLET    Take 1 tablet (25 mg total) by mouth daily.    Review of Systems  All other systems reviewed and are negative.   Social History   Tobacco Use  . Smoking status: Never Smoker  . Smokeless tobacco: Never Used  Substance Use Topics  . Alcohol use: No   Objective:   BP (!) 160/101 (BP Location: Left Arm, Patient Position: Sitting, Cuff Size: Normal)   Pulse 88   Temp 98.5 F (36.9 C)   Ht 4\' 11"  (1.499 m)   Wt 115 lb (52.2 kg)    BMI 23.23 kg/m   Physical Exam Vitals signs reviewed.  Constitutional:      Appearance: Normal appearance.  Cardiovascular:     Rate and Rhythm: Normal rate and regular rhythm.  Pulmonary:     Effort: Pulmonary effort is normal.     Breath sounds: Normal breath sounds.  Abdominal:     General: Bowel sounds are normal.     Palpations: Abdomen is soft.  Neurological:     Mental Status: She is alert.         Assessment & Plan:         DM:  Not controlled.  Encourage diabetic diet and exercise.  Continue current medication regimen.  Bring log of CBGs to next OV for review.      Staci Acosta, NP   [facility]

## 2018-06-19 NOTE — Patient Instructions (Signed)
Diabetes Mellitus and Nutrition, Adult  When you have diabetes (diabetes mellitus), it is very important to have healthy eating habits because your blood sugar (glucose) levels are greatly affected by what you eat and drink. Eating healthy foods in the appropriate amounts, at about the same times every day, can help you:  · Control your blood glucose.  · Lower your risk of heart disease.  · Improve your blood pressure.  · Reach or maintain a healthy weight.  Every person with diabetes is different, and each person has different needs for a meal plan. Your health care provider may recommend that you work with a diet and nutrition specialist (dietitian) to make a meal plan that is best for you. Your meal plan may vary depending on factors such as:  · The calories you need.  · The medicines you take.  · Your weight.  · Your blood glucose, blood pressure, and cholesterol levels.  · Your activity level.  · Other health conditions you have, such as heart or kidney disease.  How do carbohydrates affect me?  Carbohydrates, also called carbs, affect your blood glucose level more than any other type of food. Eating carbs naturally raises the amount of glucose in your blood. Carb counting is a method for keeping track of how many carbs you eat. Counting carbs is important to keep your blood glucose at a healthy level, especially if you use insulin or take certain oral diabetes medicines.  It is important to know how many carbs you can safely have in each meal. This is different for every person. Your dietitian can help you calculate how many carbs you should have at each meal and for each snack.  Foods that contain carbs include:  · Bread, cereal, rice, pasta, and crackers.  · Potatoes and corn.  · Peas, beans, and lentils.  · Milk and yogurt.  · Fruit and juice.  · Desserts, such as cakes, cookies, ice cream, and candy.  How does alcohol affect me?  Alcohol can cause a sudden decrease in blood glucose (hypoglycemia),  especially if you use insulin or take certain oral diabetes medicines. Hypoglycemia can be a life-threatening condition. Symptoms of hypoglycemia (sleepiness, dizziness, and confusion) are similar to symptoms of having too much alcohol.  If your health care provider says that alcohol is safe for you, follow these guidelines:  · Limit alcohol intake to no more than 1 drink per day for nonpregnant women and 2 drinks per day for men. One drink equals 12 oz of beer, 5 oz of wine, or 1½ oz of hard liquor.  · Do not drink on an empty stomach.  · Keep yourself hydrated with water, diet soda, or unsweetened iced tea.  · Keep in mind that regular soda, juice, and other mixers may contain a lot of sugar and must be counted as carbs.  What are tips for following this plan?    Reading food labels  · Start by checking the serving size on the "Nutrition Facts" label of packaged foods and drinks. The amount of calories, carbs, fats, and other nutrients listed on the label is based on one serving of the item. Many items contain more than one serving per package.  · Check the total grams (g) of carbs in one serving. You can calculate the number of servings of carbs in one serving by dividing the total carbs by 15. For example, if a food has 30 g of total carbs, it would be equal to 2   servings of carbs.  · Check the number of grams (g) of saturated and trans fats in one serving. Choose foods that have low or no amount of these fats.  · Check the number of milligrams (mg) of salt (sodium) in one serving. Most people should limit total sodium intake to less than 2,300 mg per day.  · Always check the nutrition information of foods labeled as "low-fat" or "nonfat". These foods may be higher in added sugar or refined carbs and should be avoided.  · Talk to your dietitian to identify your daily goals for nutrients listed on the label.  Shopping  · Avoid buying canned, premade, or processed foods. These foods tend to be high in fat, sodium,  and added sugar.  · Shop around the outside edge of the grocery store. This includes fresh fruits and vegetables, bulk grains, fresh meats, and fresh dairy.  Cooking  · Use low-heat cooking methods, such as baking, instead of high-heat cooking methods like deep frying.  · Cook using healthy oils, such as olive, canola, or sunflower oil.  · Avoid cooking with butter, cream, or high-fat meats.  Meal planning  · Eat meals and snacks regularly, preferably at the same times every day. Avoid going long periods of time without eating.  · Eat foods high in fiber, such as fresh fruits, vegetables, beans, and whole grains. Talk to your dietitian about how many servings of carbs you can eat at each meal.  · Eat 4-6 ounces (oz) of lean protein each day, such as lean meat, chicken, fish, eggs, or tofu. One oz of lean protein is equal to:  ? 1 oz of meat, chicken, or fish.  ? 1 egg.  ? ¼ cup of tofu.  · Eat some foods each day that contain healthy fats, such as avocado, nuts, seeds, and fish.  Lifestyle  · Check your blood glucose regularly.  · Exercise regularly as told by your health care provider. This may include:  ? 150 minutes of moderate-intensity or vigorous-intensity exercise each week. This could be brisk walking, biking, or water aerobics.  ? Stretching and doing strength exercises, such as yoga or weightlifting, at least 2 times a week.  · Take medicines as told by your health care provider.  · Do not use any products that contain nicotine or tobacco, such as cigarettes and e-cigarettes. If you need help quitting, ask your health care provider.  · Work with a counselor or diabetes educator to identify strategies to manage stress and any emotional and social challenges.  Questions to ask a health care provider  · Do I need to meet with a diabetes educator?  · Do I need to meet with a dietitian?  · What number can I call if I have questions?  · When are the best times to check my blood glucose?  Where to find more  information:  · American Diabetes Association: diabetes.org  · Academy of Nutrition and Dietetics: www.eatright.org  · National Institute of Diabetes and Digestive and Kidney Diseases (NIH): www.niddk.nih.gov  Summary  · A healthy meal plan will help you control your blood glucose and maintain a healthy lifestyle.  · Working with a diet and nutrition specialist (dietitian) can help you make a meal plan that is best for you.  · Keep in mind that carbohydrates (carbs) and alcohol have immediate effects on your blood glucose levels. It is important to count carbs and to use alcohol carefully.  This information is not intended to   replace advice given to you by your health care provider. Make sure you discuss any questions you have with your health care provider.  Document Released: 01/18/2005 Document Revised: 11/21/2016 Document Reviewed: 05/28/2016  Elsevier Interactive Patient Education © 2019 Elsevier Inc.

## 2018-06-20 ENCOUNTER — Telehealth: Payer: Self-pay | Admitting: Pharmacist

## 2018-06-20 LAB — CBC
Hematocrit: 35.4 % (ref 34.0–46.6)
Hemoglobin: 11.6 g/dL (ref 11.1–15.9)
MCH: 27.9 pg (ref 26.6–33.0)
MCHC: 32.8 g/dL (ref 31.5–35.7)
MCV: 85 fL (ref 79–97)
Platelets: 435 10*3/uL (ref 150–450)
RBC: 4.16 x10E6/uL (ref 3.77–5.28)
RDW: 12.4 % (ref 11.7–15.4)
WBC: 8.6 10*3/uL (ref 3.4–10.8)

## 2018-06-20 LAB — COMPREHENSIVE METABOLIC PANEL
ALT: 21 IU/L (ref 0–32)
AST: 17 IU/L (ref 0–40)
Albumin/Globulin Ratio: 1.6 (ref 1.2–2.2)
Albumin: 3.8 g/dL (ref 3.8–4.9)
Alkaline Phosphatase: 134 IU/L — ABNORMAL HIGH (ref 39–117)
BUN/Creatinine Ratio: 15 (ref 9–23)
BUN: 14 mg/dL (ref 6–24)
Bilirubin Total: 0.3 mg/dL (ref 0.0–1.2)
CO2: 21 mmol/L (ref 20–29)
Calcium: 9 mg/dL (ref 8.7–10.2)
Chloride: 101 mmol/L (ref 96–106)
Creatinine, Ser: 0.92 mg/dL (ref 0.57–1.00)
GFR calc Af Amer: 82 mL/min/{1.73_m2} (ref >59)
GFR calc non Af Amer: 71 mL/min/{1.73_m2} (ref >59)
Globulin, Total: 2.4 g/dL (ref 1.5–4.5)
Glucose: 180 mg/dL — ABNORMAL HIGH (ref 65–99)
POTASSIUM: 3.9 mmol/L (ref 3.5–5.2)
Sodium: 142 mmol/L (ref 134–144)
Total Protein: 6.2 g/dL (ref 6.0–8.5)

## 2018-06-20 LAB — HEMOGLOBIN A1C
Est. average glucose Bld gHb Est-mCnc: 321 mg/dL
Hgb A1c MFr Bld: 12.8 % — ABNORMAL HIGH (ref 4.8–5.6)

## 2018-06-20 LAB — LIPID PANEL
Chol/HDL Ratio: 4.1 ratio (ref 0.0–4.4)
Cholesterol, Total: 216 mg/dL — ABNORMAL HIGH (ref 100–199)
HDL: 53 mg/dL (ref >39)
LDL Calculated: 131 mg/dL — ABNORMAL HIGH (ref 0–99)
Triglycerides: 158 mg/dL — ABNORMAL HIGH (ref 0–149)
VLDL Cholesterol Cal: 32 mg/dL (ref 5–40)

## 2018-06-20 NOTE — Telephone Encounter (Signed)
06/20/2018 12:21:54 PM - Delene Loll renewal  06/20/2018 Received notice for patient to renew with [employer] for Entresto-printed application-will take to Dr. Rockey Situ, also mailing patient her portion to sign and return with current income/support & taxes/[id number].Delos Haring

## 2018-06-26 ENCOUNTER — Ambulatory Visit: Payer: Medicaid Other

## 2018-06-26 ENCOUNTER — Telehealth: Payer: Self-pay | Admitting: *Deleted

## 2018-06-26 NOTE — Telephone Encounter (Signed)
Dr Rockey Situ completed and signed Prescriber portion of [employer] application. Called and left message with Elmer Picker at [facility]. Placed at front desk for her to pick up at her convenience.

## 2018-07-03 ENCOUNTER — Ambulatory Visit: Payer: Medicaid Other

## 2018-07-04 ENCOUNTER — Ambulatory Visit: Payer: Medicaid Other | Admitting: Podiatry

## 2018-07-07 ENCOUNTER — Telehealth: Payer: Self-pay | Admitting: Family

## 2018-07-07 ENCOUNTER — Ambulatory Visit: Payer: Medicaid Other | Admitting: Family

## 2018-07-07 NOTE — Telephone Encounter (Signed)
Patient did not show for her [facility] appointment on 07/07/2018.

## 2018-07-07 NOTE — Progress Notes (Deleted)
Patient ID: Erin Hayes, female    DOB: [birth year], 53 y.o.   MRN: [id number]  HPI  Ms Parkinson is a 53 y/o female with a history of diabetes, GERD, hyperlipidemia, HTN, pneumonia and chronic heart failure.   Echo report from 09/25/17 reviewed and showed an EF of 20-25% along with mild MR. Echo report from 04/04/17 reviewed and showed an EF of 20-25% along with a left pleural effusion.   Cardiac catheterization done 04/05/17 and showed EF of 25% along with severely elevated LV end diastolic pressure along with mild nonobstructive CAD.  Admitted 05/21/18 due to hypokalemia due to n/v/d. Negative for C.diff. Given hydration. Given antibiotics due to UTI. Discharged after 2 days.    She presents today for a follow-up visit with a chief complaint of    Past Medical History:  Diagnosis Date  . Carpal tunnel syndrome of right wrist   . Chronic combined systolic (congestive) and diastolic (congestive) heart failure ([place])    a. 03/2017 Echo: EF 20-25%, diff HK, Gr2 DD.  . Diabetes ([place])   . GERD (gastroesophageal reflux disease)   . HLD (hyperlipidemia)   . Hypertension   . NICM (nonischemic cardiomyopathy) ([place])    a. 03/2017 Echo: EF 20-25%, diff HK, Gr2 DD, mild to mod MR, nl RV fxn;  b. 03/2017 Cath: mild nonobs dzs, EF 25%.  . Pleural effusion, left    a. 03/2017 s/p thoracentesis-->300 ml withdrawn--transudative.  . Pneumonia 02/19/2017   Past Surgical History:  Procedure Laterality Date  . COLONOSCOPY WITH PROPOFOL N/A 01/21/2017   Procedure: COLONOSCOPY WITH PROPOFOL;  Surgeon: Jonathon Bellows, MD;  Location: [facility] ENDOSCOPY;  Service: Gastroenterology;  Laterality: N/A;  . ESOPHAGOGASTRODUODENOSCOPY (EGD) WITH PROPOFOL N/A 01/21/2017   Procedure: ESOPHAGOGASTRODUODENOSCOPY (EGD) WITH PROPOFOL;  Surgeon: Jonathon Bellows, MD;  Location: [facility] ENDOSCOPY;  Service: Gastroenterology;  Laterality: N/A;  . FLEXIBLE SIGMOIDOSCOPY N/A 11/04/2016   Procedure: FLEXIBLE SIGMOIDOSCOPY;  Surgeon: Wilford Corner, MD;  Location: [facility] ENDOSCOPY;  Service: Endoscopy;  Laterality: N/A;  . LEFT HEART CATH AND CORONARY ANGIOGRAPHY N/A 04/05/2017   Procedure: LEFT HEART CATH AND CORONARY ANGIOGRAPHY;  Surgeon: Wellington Hampshire, MD;  Location: [place] CV LAB;  Service: Cardiovascular;  Laterality: N/A;  . LITHOTRIPSY     Family History  Problem Relation Age of Onset  . Lung cancer Mother   . Hypertension Father   . CAD Father        a. MI age 75  . COPD Neg Hx   . Diabetes Mellitus II Neg Hx    Social History   Tobacco Use  . Smoking status: Never Smoker  . Smokeless tobacco: Never Used  Substance Use Topics  . Alcohol use: No   No Known Allergies    Review of Systems  Constitutional: Positive for fatigue (with moderate exertion). Negative for appetite change and fever.  HENT: Positive for rhinorrhea. Negative for congestion and sore throat.   Eyes: Negative for pain.  Respiratory: Negative for cough, chest tightness, shortness of breath and wheezing.   Cardiovascular: Negative for chest pain, palpitations and leg swelling.  Gastrointestinal: Negative for abdominal distention and abdominal pain.  Endocrine: Negative.   Genitourinary: Negative.   Musculoskeletal: Negative for back pain and neck pain.  Skin: Positive for rash ("bug bites around ankles").  Allergic/Immunologic: Negative.   Neurological: Positive for headaches. Negative for dizziness and light-headedness.  Hematological: Negative for adenopathy. Does not bruise/bleed easily.  Psychiatric/Behavioral: Negative for dysphoric mood and sleep disturbance (sleeping  on 3 pillows for comfort). The patient is not nervous/anxious.     Physical Exam  Constitutional: She is oriented to person, place, and time. She appears well-developed and well-nourished.  HENT:  Head: Normocephalic and atraumatic.  Neck: Normal range of motion. Neck supple. No JVD present.  Cardiovascular: Normal rate and regular rhythm.   Pulmonary/Chest: Effort normal. She has no wheezes. She has no rales.  Abdominal: Soft. She exhibits no distension. There is no abdominal tenderness.  Musculoskeletal:        General: No tenderness or edema.  Neurological: She is alert and oriented to person, place, and time.  Skin: Skin is warm and dry. Rash noted. Rash is urticarial (bilateral lower legs (patient denies itching)).  Psychiatric: She has a normal mood and affect. Her behavior is normal. Thought content normal.  Nursing note and vitals reviewed.   Assessment & Plan:  1: Chronic heart failure with reduced ejection fraction- - NYHA class II - euvolemic today - now weighing daily and she was reminded to call for an overnight weight gain of >2 pounds or a weekly weight gain of >5 pounds - weight  - not adding salt to her food and she was reminded to keep daily sodium intake to 2000mg /day - carvedilol increased to 12.5mg  at her last visit - consider titrating carvedilol/entresto at future visits - awaiting on email from [employer] rep to see if patient can afford it - saw cardiology Rockey Situ) 08/22/17 - BNP 04/03/17 was 1287.0  2: HTN- - BP  - BMP from 06/19/2018 reviewed and showed sodium 142, potassium 3.9, creatinine 0.92 and GFR 82 - went to [facility] 03/27/18  3: DM- - A1c 06/19/2018 was 12.8% which is down from previous reading - glucose at home this morning was     Patient did not bring her medications nor a list. Each medication was verbally reviewed with the patient and she was encouraged to bring the bottles to every visit to confirm accuracy of list.

## 2018-07-11 ENCOUNTER — Encounter: Payer: Self-pay | Admitting: Podiatry

## 2018-07-11 ENCOUNTER — Telehealth: Payer: Self-pay | Admitting: Pharmacist

## 2018-07-11 NOTE — Telephone Encounter (Signed)
07/11/2018 11:11:31 AM - Delene Loll pending  07/11/2018 I have received the signed portion of [employer] application back from provider for [facility], holding for patient to return her portion--mailed to patient 06/20/2018.Delos Haring

## 2018-07-18 NOTE — Progress Notes (Signed)
This encounter was created in error - please disregard.

## 2018-08-14 ENCOUNTER — Ambulatory Visit: Payer: Medicaid Other

## 2018-08-14 ENCOUNTER — Other Ambulatory Visit: Payer: Medicaid Other

## 2018-08-21 ENCOUNTER — Ambulatory Visit: Payer: Medicaid Other

## 2018-08-27 ENCOUNTER — Telehealth: Payer: Self-pay | Admitting: Pharmacist

## 2018-08-27 NOTE — Telephone Encounter (Signed)
08/27/2018 8:14:46 AM - Delene Loll  08/27/2018 I previously mailed letter to patient 06/20/2018 to sign [employer] application for renewal on Entresto-needing patient to sign and return with income/support & taxes/4506T--patient never returned the paperwork, I did receive the paperwork back from provider signed 06/26/2018. I have today reprinted application and mailing to patient to sign and provide requested information then return to [place], after I receive this back from patient I will then send forms to Dr. Rockey Situ to sign.Delos Haring

## 2018-09-04 ENCOUNTER — Ambulatory Visit: Payer: Medicaid Other | Admitting: Pharmacist

## 2018-09-04 DIAGNOSIS — Z79899 Other long term (current) drug therapy: Secondary | ICD-10-CM

## 2018-09-04 NOTE — Progress Notes (Signed)
[facility] Visit Note  Patient: Erin Hayes MRN: [id number] Date of Birth: 02-May-1966 PCP: Staci Acosta, NP   [name] 53 y.o. female called via telelphone for outreach annual MTM visit today.  There were no vitals taken for this visit.  Patient Information   Past Medical History:  Diagnosis Date  . Carpal tunnel syndrome of right wrist   . Chronic combined systolic (congestive) and diastolic (congestive) heart failure ([place])    a. 03/2017 Echo: EF 20-25%, diff HK, Gr2 DD.  . Diabetes ([place])   . GERD (gastroesophageal reflux disease)   . HLD (hyperlipidemia)   . Hypertension   . NICM (nonischemic cardiomyopathy) ([place])    a. 03/2017 Echo: EF 20-25%, diff HK, Gr2 DD, mild to mod MR, nl RV fxn;  b. 03/2017 Cath: mild nonobs dzs, EF 25%.  . Pleural effusion, left    a. 03/2017 s/p thoracentesis-->300 ml withdrawn--transudative.  . Pneumonia 02/19/2017      Past Surgical History:  Procedure Laterality Date  . COLONOSCOPY WITH PROPOFOL N/A 01/21/2017   Procedure: COLONOSCOPY WITH PROPOFOL;  Surgeon: Jonathon Bellows, MD;  Location: [facility] ENDOSCOPY;  Service: Gastroenterology;  Laterality: N/A;  . ESOPHAGOGASTRODUODENOSCOPY (EGD) WITH PROPOFOL N/A 01/21/2017   Procedure: ESOPHAGOGASTRODUODENOSCOPY (EGD) WITH PROPOFOL;  Surgeon: Jonathon Bellows, MD;  Location: [facility] ENDOSCOPY;  Service: Gastroenterology;  Laterality: N/A;  . FLEXIBLE SIGMOIDOSCOPY N/A 11/04/2016   Procedure: FLEXIBLE SIGMOIDOSCOPY;  Surgeon: Wilford Corner, MD;  Location: [facility] ENDOSCOPY;  Service: Endoscopy;  Laterality: N/A;  . LEFT HEART CATH AND CORONARY ANGIOGRAPHY N/A 04/05/2017   Procedure: LEFT HEART CATH AND CORONARY ANGIOGRAPHY;  Surgeon: Wellington Hampshire, MD;  Location: [place] CV LAB;  Service: Cardiovascular;  Laterality: N/A;  . LITHOTRIPSY       Family History  Problem Relation Age of Onset  . Lung cancer Mother   . Hypertension Father   . CAD Father        a. MI age 97   . COPD Neg Hx   . Diabetes Mellitus II Neg Hx     New Diagnoses (since last visit): n/a  Family Support: good, lives with dtr and they are helping each other  Lifestyle Diet: Has been reducing portions to loose weight and help blood pressure          Social History   Substance and Sexual Activity  Alcohol Use No      Social History   Tobacco Use  Smoking Status Never Smoker  Smokeless Tobacco Never Used      Health Maintenance  Topic Date Due  . Hepatitis C Screening  02-23-1966  . FOOT EXAM  11/14/1973  . OPHTHALMOLOGY EXAM  11/14/1973  . URINE MICROALBUMIN  11/14/1973  . TETANUS/TDAP  11/15/1982  . PAP SMEAR-Modifier  11/14/1984  . MAMMOGRAM  11/14/2013  . INFLUENZA VACCINE  12/06/2018  . HEMOGLOBIN A1C  12/18/2018  . COLONOSCOPY  01/21/2022  . PNEUMOCOCCAL POLYSACCHARIDE VACCINE AGE 88-64 HIGH RISK  Completed  . HIV Screening  Completed   Outpatient Encounter Medications as of 09/04/2018  Medication Sig  . albuterol (PROVENTIL HFA;VENTOLIN HFA) 108 (90 Base) MCG/ACT inhaler Inhale 2 puffs into the lungs every 4 (four) hours as needed for wheezing or shortness of breath.  [name] aspirin 81 MG tablet Take 81 mg by mouth daily.  . carvedilol (COREG) 12.5 MG tablet Take 1 tablet (12.5 mg total) by mouth 2 (two) times daily.  . furosemide (LASIX) 20 MG tablet Take 1 tablet (20  mg total) by mouth daily.  . insulin NPH-regular Human (NOVOLIN 70/30) (70-30) 100 UNIT/ML injection 20 units in the am and 10 units at night.  . sacubitril-valsartan (ENTRESTO) 49-51 MG Take 1 tablet by mouth 2 (two) times daily.  [name] spironolactone (ALDACTONE) 25 MG tablet Take 1 tablet (25 mg total) by mouth daily.  . [DISCONTINUED] potassium chloride 20 MEQ TBCR Take 10 mEq by mouth daily for 5 days. (Patient not taking: Reported on 06/09/2018)   No facility-administered encounter medications on file as of 09/04/2018.      Assessment and Plan:  Compliance/Adherance: pt knows names,  frequencies, and indications for all her medications with very little prompting. Pt states she does miss doses sometimes. We discussed importance of taking meds w/o missing doses since this translates to better health outcomes and that better health outcomes are needed if she wishes to decrease the amount of medications she is taking. We also discussed importance of getting medications filled regularly and on time. Pt seems very motivated to increase compliance/adherance.  DM: on Humulin 70/30 insulin 20 units BID. Last A1C on 06/19/18 was 12.8. this is a decrease from 14.9 on 05/22/18. She told me she checks her sugars TID with meals and states it usually ranges about 150-160. We discussed the importance of working to get them to goal of <120. I also encouraged pt to keep on being diligent and checking her sugars and to keep working on her diet. We discussed a goal A1C of < 7. Notice pt is not on a statin (see below).  HLD: recent lipid panel 06/19/18 TC 216 TG 158 HDL 158 LDL 131; Current ASCVD risk est at 15.9%. Will req high-intensity statin (Atorvastatin 40mg ) from provider.  HTN/CHF: BP not taken today. Pt states she checked at [facility] recently and it was 140/90. Encouraged pt that this was getting closer to her goal of <130/80.  On Carvedilol 25mg  BID (discussed pt needs new rx), Furosemide 20mg  QDAY, Spironolactone 25mg  Qday, Entresto 49-51 BID. Explained to pt that we do not have the Entresto in stock and that request for signed re-application and income information was sent and this must be returned to be submitted to drug  manufactuer in order for [place] to receive it and dispense to pt. She v/u.  Intermittent Asthma: states she uses rescue inh/ maybe 1-2x/month, denies nightime awakenings or daytime symptoms.  RTC 6 mos f/u on DM and BP check.  Netta Neat, PharmD, [facility]) [phone]

## 2018-09-05 ENCOUNTER — Encounter (INDEPENDENT_AMBULATORY_CARE_PROVIDER_SITE_OTHER): Payer: Self-pay

## 2018-09-05 ENCOUNTER — Other Ambulatory Visit: Payer: Self-pay

## 2018-09-18 ENCOUNTER — Other Ambulatory Visit: Payer: Self-pay | Admitting: Adult Health Nurse Practitioner

## 2018-09-18 MED ORDER — ROSUVASTATIN CALCIUM 20 MG PO TABS: 20.0000 mg | ORAL_TABLET | Freq: Every day | ORAL | 4 refills | Status: DC

## 2018-09-24 ENCOUNTER — Telehealth: Payer: Self-pay | Admitting: Cardiovascular Disease

## 2018-09-24 NOTE — Telephone Encounter (Signed)
Virtual Visit Pre-Appointment Phone Call  "(Name), I am calling you today to discuss your upcoming appointment. We are currently trying to limit exposure to the virus that causes COVID-19 by seeing patients at home rather than in the office."  1. "What is the BEST phone number to call the day of the visit?" - include this in appointment notes  2. Do you have or have access to (through a family member/friend) a smartphone with video capability that we can use for your visit?" a. If yes - list this number in appt notes as cell (if different from BEST phone #) and list the appointment type as a VIDEO visit in appointment notes b. If no - list the appointment type as a PHONE visit in appointment notes  3. Confirm consent - "In the setting of the current Covid19 crisis, you are scheduled for a (phone or video) visit with your provider on (date) at (time).  Just as we do with many in-office visits, in order for you to participate in this visit, we must obtain consent.  If you'd like, I can send this to your mychart (if signed up) or email for you to review.  Otherwise, I can obtain your verbal consent now.  All virtual visits are billed to your insurance company just like a normal visit would be.  By agreeing to a virtual visit, we'd like you to understand that the technology does not allow for your provider to perform an examination, and thus may limit your provider's ability to fully assess your condition. If your provider identifies any concerns that need to be evaluated in person, we will make arrangements to do so.  Finally, though the technology is pretty good, we cannot assure that it will always work on either your or our end, and in the setting of a video visit, we may have to convert it to a phone-only visit.  In either situation, we cannot ensure that we have a secure connection.  Are you willing to proceed?" STAFF: Did the patient verbally acknowledge consent to telehealth visit? Document  YES/NO here: YES   4. Advise patient to be prepared - "Two hours prior to your appointment, go ahead and check your blood pressure, pulse, oxygen saturation, and your weight (if you have the equipment to check those) and write them all down. When your visit starts, your provider will ask you for this information. If you have an Apple Watch or Kardia device, please plan to have heart rate information ready on the day of your appointment. Please have a pen and paper handy nearby the day of the visit as well."  5. Give patient instructions for MyChart download to smartphone OR Doximity/Doxy.me as below if video visit (depending on what platform provider is using)  6. Inform patient they will receive a phone call 15 minutes prior to their appointment time (may be from unknown caller ID) so they should be prepared to answer    [facility] has been deemed a candidate for a follow-up tele-health visit to limit community exposure during the Covid-19 pandemic. I spoke with the patient via phone to ensure availability of phone/video source, confirm preferred email & phone number, and discuss instructions and expectations.  I reminded Erin Hayes to be prepared with any vital sign and/or heart rhythm information that could potentially be obtained via home monitoring, at the time of her visit. I reminded Erin Hayes to expect a phone call prior  to her visit.  Caryl Pina Gerringer 09/24/2018 2:30 PM   INSTRUCTIONS FOR DOWNLOADING THE MYCHART APP TO SMARTPHONE  - The patient must first make sure to have activated MyChart and know their login information - If Apple, go to [employer] and type in MyChart in the search bar and download the app. If Android, ask patient to go to [employer] and type in [place] in the search bar and download the app. The app is free but as with any other app downloads, their phone may require them to verify saved payment information or Apple/Android  password.  - The patient will need to then log into the app with their MyChart username and password, and select [facility] as their healthcare provider to link the account. When it is time for your visit, go to the MyChart app, find appointments, and click Begin Video Visit. Be sure to Select Allow for your device to access the Microphone and Camera for your visit. You will then be connected, and your provider will be with you shortly.  **If they have any issues connecting, or need assistance please contact MyChart service desk [phone])**  **If using a computer, in order to ensure the best quality for their visit they will need to use either of the following Internet Browsers: [employer], or Google Chrome**  IF USING DOXIMITY or DOXY.ME - The patient will receive a link just prior to their visit by text.     FULL LENGTH CONSENT FOR TELE-HEALTH VISIT   I hereby voluntarily request, consent and authorize [facility] and its employed or contracted physicians, physician assistants, nurse practitioners or other licensed health care professionals (the Practitioner), to provide me with telemedicine health care services (the Services") as deemed necessary by the treating Practitioner. I acknowledge and consent to receive the Services by the Practitioner via telemedicine. I understand that the telemedicine visit will involve communicating with the Practitioner through live audiovisual communication technology and the disclosure of certain medical information by electronic transmission. I acknowledge that I have been given the opportunity to request an in-person assessment or other available alternative prior to the telemedicine visit and am voluntarily participating in the telemedicine visit.  I understand that I have the right to withhold or withdraw my consent to the use of telemedicine in the course of my care at any time, without affecting my right to future care or treatment,  and that the Practitioner or I may terminate the telemedicine visit at any time. I understand that I have the right to inspect all information obtained and/or recorded in the course of the telemedicine visit and may receive copies of available information for a reasonable fee.  I understand that some of the potential risks of receiving the Services via telemedicine include:   Delay or interruption in medical evaluation due to technological equipment failure or disruption;  Information transmitted may not be sufficient (e.g. poor resolution of images) to allow for appropriate medical decision making by the Practitioner; and/or   In rare instances, security protocols could fail, causing a breach of personal health information.  Furthermore, I acknowledge that it is my responsibility to provide information about my medical history, conditions and care that is complete and accurate to the best of my ability. I acknowledge that Practitioner's advice, recommendations, and/or decision may be based on factors not within their control, such as incomplete or inaccurate data provided by me or distortions of diagnostic images or specimens that may result from electronic transmissions. I  understand that the practice of medicine is not an exact science and that Practitioner makes no warranties or guarantees regarding treatment outcomes. I acknowledge that I will receive a copy of this consent concurrently upon execution via email to the email address I last provided but may also request a printed copy by calling the office of [facility].    I understand that my insurance will be billed for this visit.   I have read or had this consent read to me.  I understand the contents of this consent, which adequately explains the benefits and risks of the Services being provided via telemedicine.   I have been provided ample opportunity to ask questions regarding this consent and the Services and have had my questions  answered to my satisfaction.  I give my informed consent for the services to be provided through the use of telemedicine in my medical care  By participating in this telemedicine visit I agree to the above.

## 2018-09-26 ENCOUNTER — Telehealth: Payer: Self-pay | Admitting: Pharmacist

## 2018-09-26 NOTE — Telephone Encounter (Signed)
09/26/2018 10:57:18 AM - Gave patient recert packet  8/88/7579 Patient in office today to pick up meds, she also brought in a [employer] form she stated she received in the mail-I explained to patient that I mailed her forms on 08/26/18 to sign and return with income/support & taxes/4506T. She stated she did not receive. I got patient to sign the [facility] form, and gave her the Recertification packet, she said she had something in her car, she went out to the car and brought back in the request letter from Feb. she has signed March and had the letter of support completed by daughter(Amber) not notarized, also did not sign 4506T. I explained to patient that she needed to take the Recert packet complete and get back to [place] as soon as possible, explained also that the letter of support must be notarized. Stressed to her that I could not order this medication until she returns paperwork to [place].Delos Haring

## 2018-10-08 ENCOUNTER — Telehealth: Payer: Self-pay | Admitting: Cardiovascular Disease

## 2018-10-08 ENCOUNTER — Other Ambulatory Visit: Payer: Self-pay

## 2018-10-12 IMAGING — US US ABDOMEN LIMITED
1 series · 14 of 25 positions shown · non-contrast
Comparison: 10/02/2016 abdominal CT

CLINICAL DATA: Epigastric abdominal pain for 3 days

EXAM:
ULTRASOUND ABDOMEN LIMITED RIGHT UPPER QUADRANT

[Series 1: us abdomen limited · 14 of 35 slices shown]
[im 1/35]
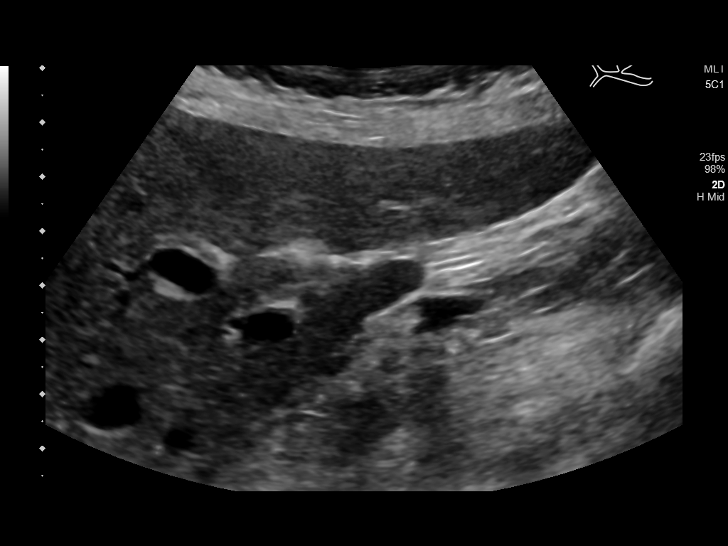
[im 3/35]
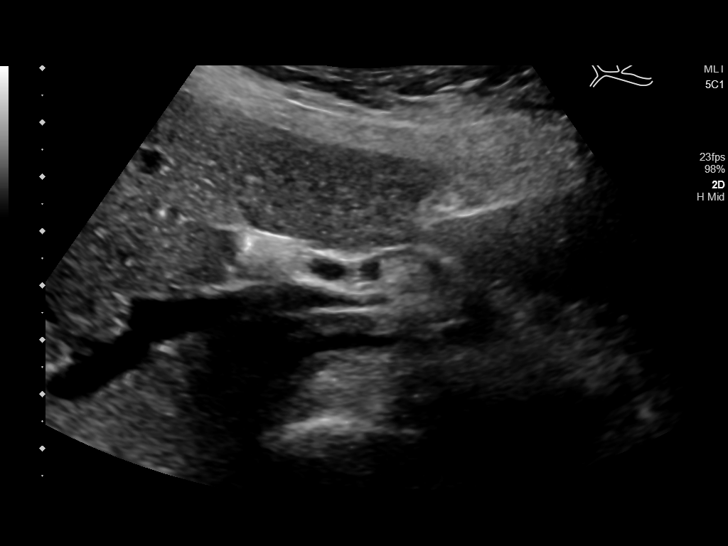
[im 6/35]
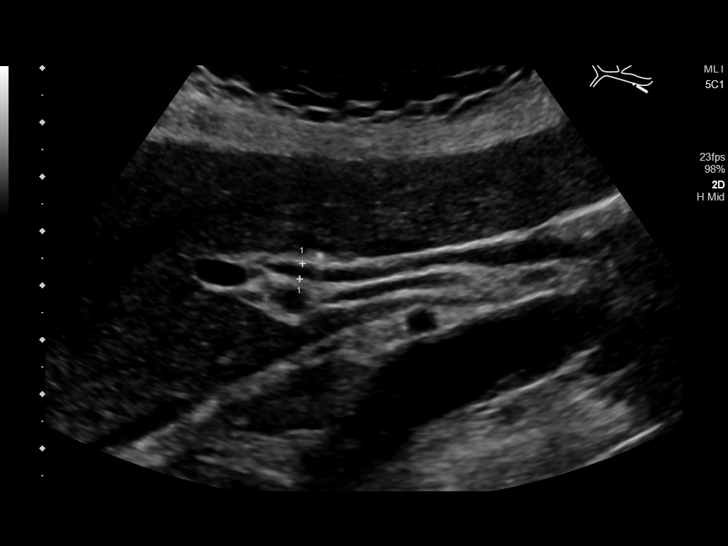
[im 9/35]
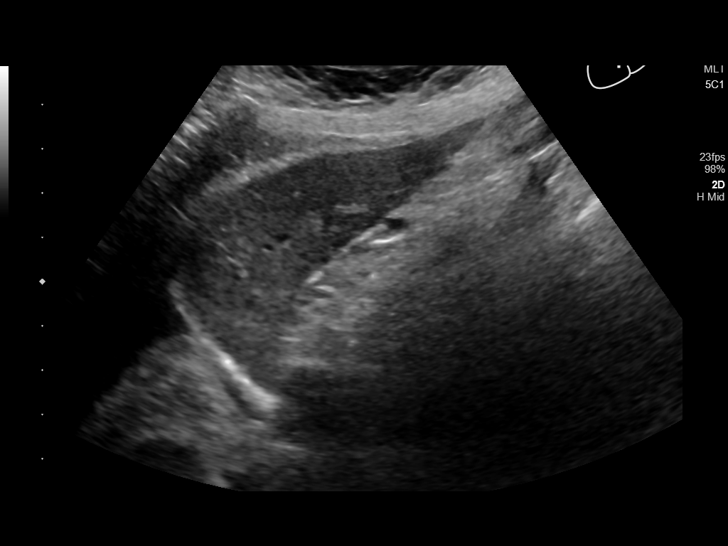
[im 12/35]
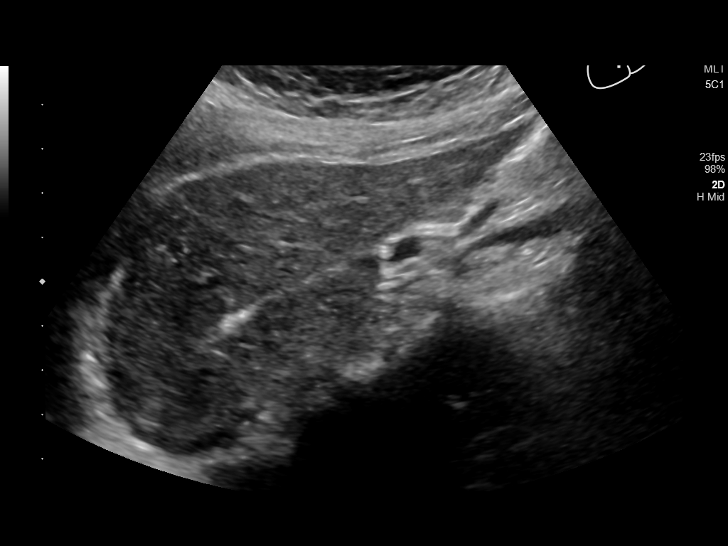
[im 13/35]
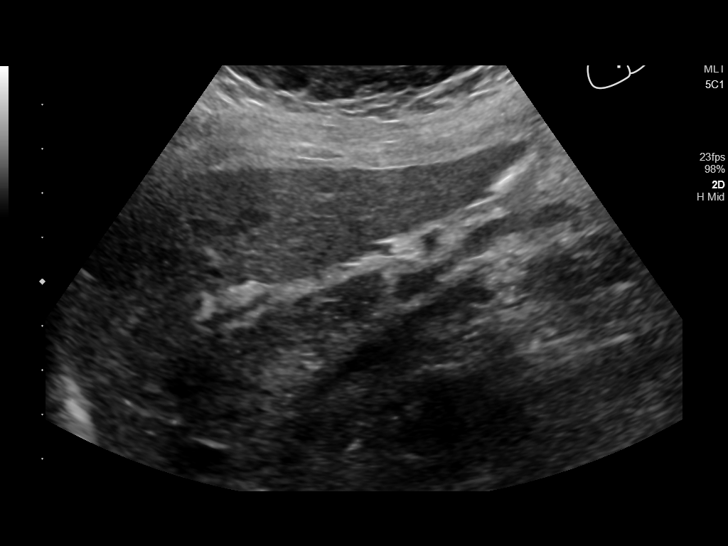
[im 16/35]
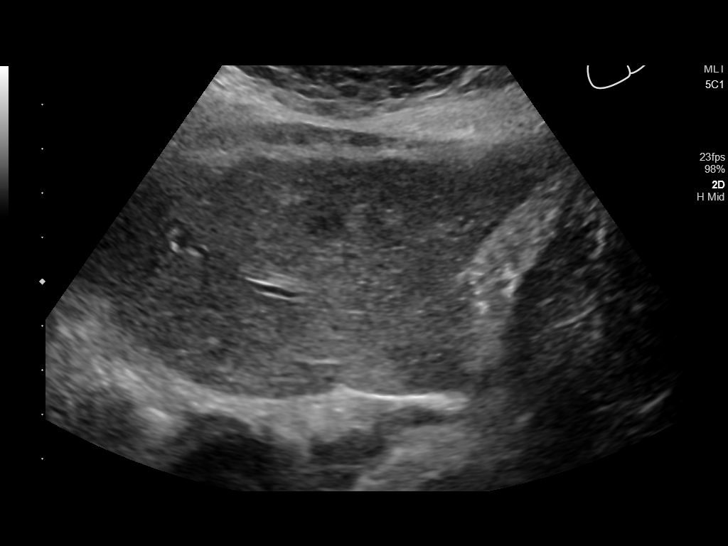
[im 19/35]
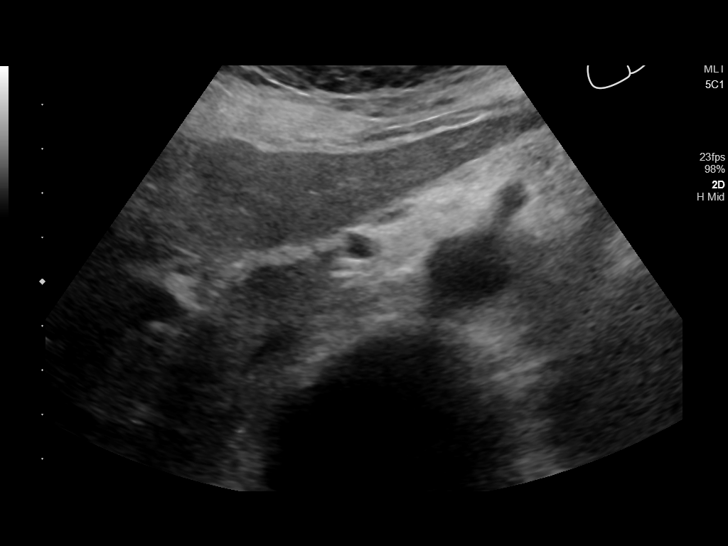
[im 22/35]
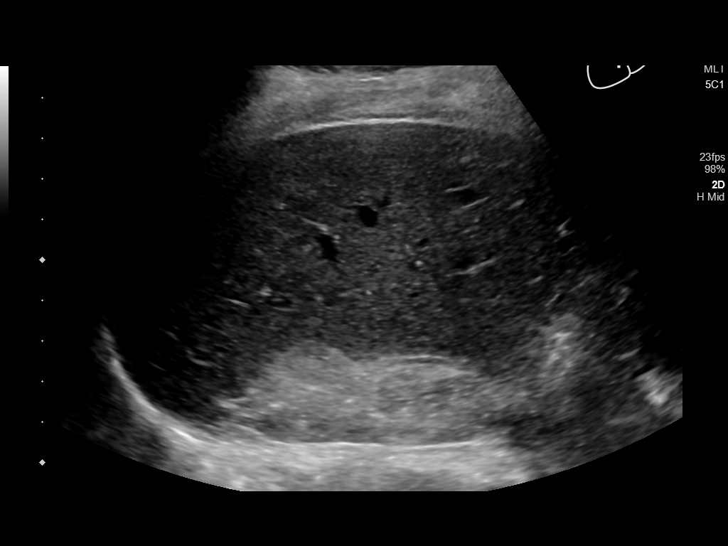
[im 23/35]
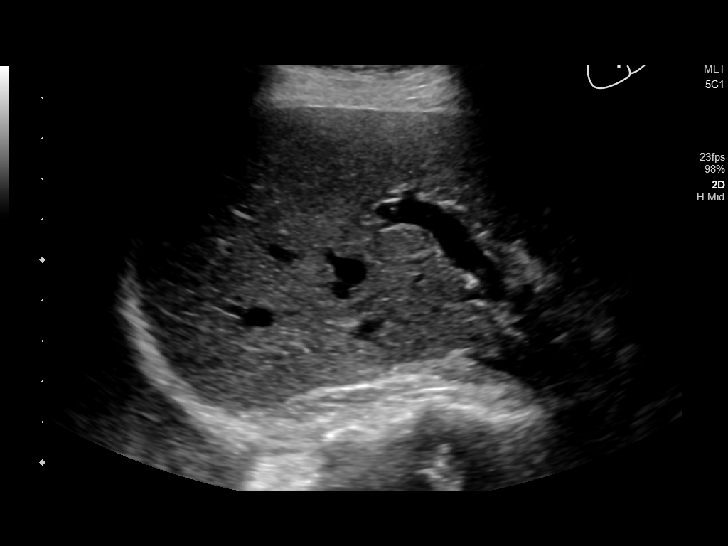
[im 26/35]
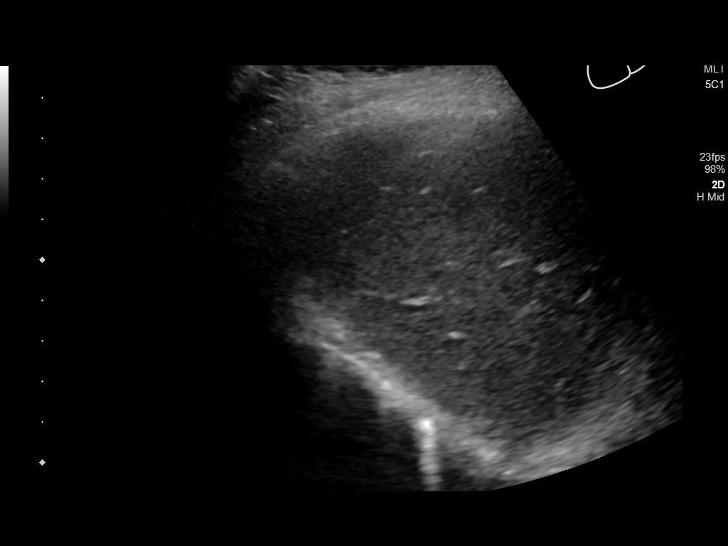
[im 29/35]
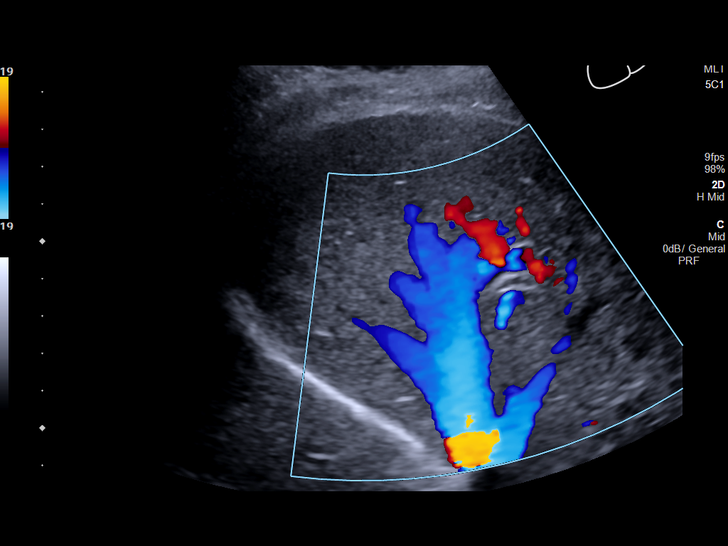
[im 32/35]
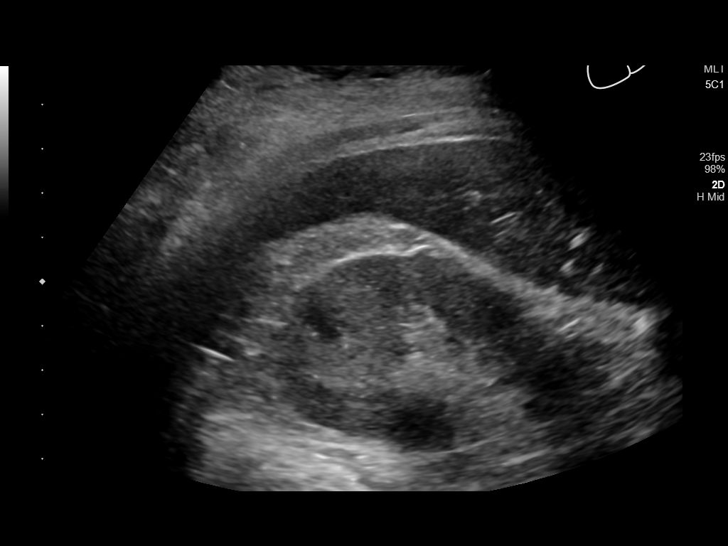
[im 35/35]
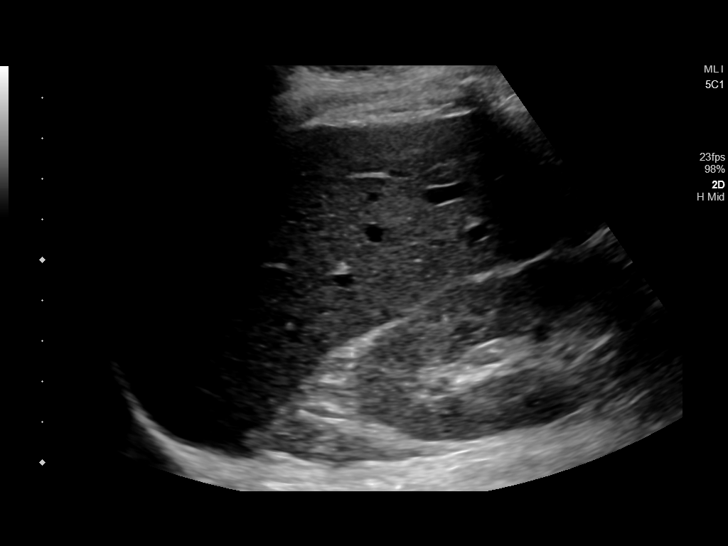

[14 of 25 positions shown; findings below may reference images not displayed]

FINDINGS: Gallbladder:

Surgically absent

Common bile duct:

Diameter: 3 mm

Liver:

No focal lesion identified. Within normal limits in parenchymal
echogenicity. Portal vein is patent on color Doppler imaging with
normal direction of blood flow towards the liver.
IMPRESSION: Cholecystectomy.

No abnormal finding.

## 2018-10-14 ENCOUNTER — Telehealth: Payer: Self-pay

## 2018-10-14 NOTE — Telephone Encounter (Signed)
Please see note below pt needing Entresto samples

## 2018-10-14 NOTE — Telephone Encounter (Signed)
Patient needs Entresto samples, please call to discuss if we have any. Please contact patient

## 2018-10-14 NOTE — Telephone Encounter (Signed)
Called Patient. No answer. No VM.  Samples placed up front for patient.   Drug name: ENTRESTO     Strength: 49/51MG        Qty: 1 box LOT: [id number]  Exp.Date: 05/2019  Janan Ridge 3:53 PM 10/14/2018

## 2018-10-30 ENCOUNTER — Telehealth: Payer: Self-pay | Admitting: Cardiovascular Disease

## 2018-10-30 NOTE — Telephone Encounter (Signed)
Pt given 1 bottle of Entresto 49-51 mg tablet. Pt given free 30 day for entresto. Pt given a $10 copay card to attempt to use at her pharmacy. Pt mentioned that she is in the process of getting pt assistance for her medication through Medication management. She is currently awaiting to see if she will be approved. Pt was advise to schedule a future appointment with Dr.Gollan bc she is overdue for 1 yr fu. Pt will contact office for any further questions if needed.  Lot#[id number] EXP:4/22

## 2018-10-30 NOTE — Telephone Encounter (Signed)
Patient in lobby!!  Patient calling the office for samples of medication:   1.  What medication and dosage are you requesting samples for? Entresto 49-51 mg bid  2.  Are you currently out of this medication? yes

## 2018-11-03 ENCOUNTER — Telehealth: Payer: Self-pay | Admitting: Pharmacist

## 2018-11-03 NOTE — Telephone Encounter (Signed)
11/03/2018 8:05:29 AM - Entresto/[employer] approval  11/03/2018 I have received a letter of approval from [employer] for [place], approved till 10/31/2019, Patient ID: [id number].Erin Hayes

## 2018-11-10 NOTE — Progress Notes (Deleted)
Cardiology Office Note Date:  11/10/2018  Patient ID:  Erin Hayes, DOB 11-27-1965, MRN [id number] PCP:  Staci Acosta, NP  Cardiologist:  Dr. Rockey Situ, MD  ***refresh   Chief Complaint: ***  History of Present Illness: Erin Hayes is a 53 y.o. female with history of nonobstructive CAD by [place] in 31/5400, combined systolic and diastolic CHF secondary to NICM, poorly controlled DM2, HTN, HLD, chronic diarrhea, GERD and medical noncompliance with frequent cancellation or no show of appointments.   She was admitted in 03/2017 with acute heart failure with echo showing an EF of 20-25%, moderate concentric LVH, unable to exclude RWMA, Gr2DD, mild to moderate MR, RVSF normal, small pericardial effusion was noted and circumferential. She underwent LHC on 04/05/2017 that showed mild nonobstructive disease with an EF of 15-20% with global HK and severely elevated LVEDP at 33 mmHg. She was successfully diuresed. She was last seen by Dr. Rockey Situ on 08/22/2017 and reported doing well. She was not taking her Coreg, which was restarted. She was taking Entresto and spironolactone. Weight was noted to be 106 pounds. Repeat echo in 09/2017 showed a persistently low EF of 20-25%, diffuse HK, unable to exclude RWMA, Gr2DD, mild MR, normal size left atrium, RVSF mildly reduced, PASP normal, small pericardial effusion was noted. Her Entresto was titrated to 49/51 mg bid. Since then, she has been followed by the [facility], last being seen there on 06/09/2018 with a weight of 113 pounds, which was stable based on their scale. Her Coreg was titrated to 12.5 mg bid and she was continued on Lasix 20 mg daily, Entresto 49/51 mg bid, and spironolactone 25 mg daily.   ***  Labs: 06/2018 - HGB 11.6, LDL 131, SCr 0.92, K+ 3.9, albumin 3.8, AST/ALT normal, A1c 12.8   Past Medical History:  Diagnosis Date  . Carpal tunnel syndrome of right wrist   . Chronic combined systolic (congestive) and diastolic  (congestive) heart failure ([place])    a. 03/2017 Echo: EF 20-25%, diff HK, Gr2 DD.  . Diabetes ([place])   . GERD (gastroesophageal reflux disease)   . HLD (hyperlipidemia)   . Hypertension   . NICM (nonischemic cardiomyopathy) ([place])    a. 03/2017 Echo: EF 20-25%, diff HK, Gr2 DD, mild to mod MR, nl RV fxn;  b. 03/2017 Cath: mild nonobs dzs, EF 25%.  . Pleural effusion, left    a. 03/2017 s/p thoracentesis-->300 ml withdrawn--transudative.  . Pneumonia 02/19/2017    Past Surgical History:  Procedure Laterality Date  . COLONOSCOPY WITH PROPOFOL N/A 01/21/2017   Procedure: COLONOSCOPY WITH PROPOFOL;  Surgeon: Jonathon Bellows, MD;  Location: [facility] ENDOSCOPY;  Service: Gastroenterology;  Laterality: N/A;  . ESOPHAGOGASTRODUODENOSCOPY (EGD) WITH PROPOFOL N/A 01/21/2017   Procedure: ESOPHAGOGASTRODUODENOSCOPY (EGD) WITH PROPOFOL;  Surgeon: Jonathon Bellows, MD;  Location[facility] ENDOSCOPY;  Service: Gastroenterology;  Laterality: N/A;  . FLEXIBLE SIGMOIDOSCOPY N/A 11/04/2016   Procedure: FLEXIBLE SIGMOIDOSCOPY;  Surgeon: Wilford Corner, MD;  Location: [facility] ENDOSCOPY;  Service: Endoscopy;  Laterality: N/A;  . LEFT HEART CATH AND CORONARY ANGIOGRAPHY N/A 04/05/2017   Procedure: LEFT HEART CATH AND CORONARY ANGIOGRAPHY;  Surgeon: Wellington Hampshire, MD;  Location: [place] CV LAB;  Service: Cardiovascular;  Laterality: N/A;  . LITHOTRIPSY      No outpatient medications have been marked as taking for the 11/11/18 encounter (Appointment) with Rise Mu, PA-C.    Allergies:   Patient has no known allergies.   Social History:  The patient  reports that  she has never smoked. She has never used smokeless tobacco. She reports that she does not drink alcohol or use drugs.   Family History:  The patient's family history includes CAD in her father; Hypertension in her father; Lung cancer in her mother.  ROS:   ROS   PHYSICAL EXAM: *** VS:  There were no vitals taken for this visit. BMI: There is no height or  weight on file to calculate BMI.  Physical Exam   EKG:  Was ordered and interpreted by me today. Shows ***  Recent Labs: 05/23/2018: Magnesium 1.4 06/19/2018: ALT 21; BUN 14; Creatinine, Ser 0.92; Hemoglobin 11.6; Platelets 435; Potassium 3.9; Sodium 142  06/19/2018: Chol/HDL Ratio 4.1; Cholesterol, Total 216; HDL 53; LDL Calculated 131; Triglycerides 158   CrCl cannot be calculated (Patient's most recent lab result is older than the maximum 21 days allowed.).   Wt Readings from Last 3 Encounters:  06/19/18 115 lb (52.2 kg)  06/09/18 113 lb (51.3 kg)  05/29/18 111 lb 6 oz (50.5 kg)     Other studies reviewed: Additional studies/records reviewed today include: summarized above  ASSESSMENT AND PLAN:  1. ***  Disposition: F/u with Dr. Rockey Situ or an APP in ***.  Current medicines are reviewed at length with the patient today.  The patient did not have any concerns regarding medicines.  Signed, Christell Faith, PA-C 11/10/2018 4:17 PM     [place], [place] [id number] [phone]

## 2018-11-11 ENCOUNTER — Ambulatory Visit: Payer: Self-pay | Admitting: Physician Assistant

## 2018-11-13 ENCOUNTER — Telehealth: Payer: Self-pay | Admitting: Pharmacy Technician

## 2018-11-13 NOTE — Telephone Encounter (Signed)
Patient has Medicaid with prescription drug coverage.  No longer meets the eligibility criteria for [facility].  [facility]

## 2018-11-18 ENCOUNTER — Other Ambulatory Visit: Payer: Self-pay | Admitting: Family

## 2018-11-18 MED ORDER — SACUBITRIL-VALSARTAN 49-51 MG PO TABS: 1.0000 | ORAL_TABLET | Freq: Two times a day (BID) | ORAL | 0 refills | Status: DC

## 2018-11-19 NOTE — Progress Notes (Deleted)
Patient ID: Erin Hayes, female    DOB: Aug 22, 1965, 53 y.o.   MRN: [id number]  HPI  Erin Hayes is a 53 y/o female with a history of diabetes, GERD, hyperlipidemia, HTN, pneumonia and chronic heart failure.   Echo report from 09/25/17 reviewed and showed an EF of 20-25% along with mild MR. Echo report from 04/04/17 reviewed and showed an EF of 20-25% along with a left pleural effusion.   Cardiac catheterization done 04/05/17 and showed EF of 25% along with severely elevated LV end diastolic pressure along with mild nonobstructive CAD.  Admitted 05/21/18 due to hypokalemia due to n/v/d. Negative for C.diff. Given hydration. Given antibiotics due to UTI. Discharged after 2 days.    She presents today for a follow-up visit with a chief complaint of    Past Medical History:  Diagnosis Date  . Carpal tunnel syndrome of right wrist   . Chronic combined systolic (congestive) and diastolic (congestive) heart failure ([place])    a. 03/2017 Echo: EF 20-25%, diff HK, Gr2 DD.  . Diabetes ([place])   . GERD (gastroesophageal reflux disease)   . HLD (hyperlipidemia)   . Hypertension   . NICM (nonischemic cardiomyopathy) ([place])    a. 03/2017 Echo: EF 20-25%, diff HK, Gr2 DD, mild to mod MR, nl RV fxn;  b. 03/2017 Cath: mild nonobs dzs, EF 25%.  . Pleural effusion, left    a. 03/2017 s/p thoracentesis-->300 ml withdrawn--transudative.  . Pneumonia 02/19/2017   Past Surgical History:  Procedure Laterality Date  . COLONOSCOPY WITH PROPOFOL N/A 01/21/2017   Procedure: COLONOSCOPY WITH PROPOFOL;  Surgeon: Jonathon Bellows, MD;  Location: [facility] ENDOSCOPY;  Service: Gastroenterology;  Laterality: N/A;  . ESOPHAGOGASTRODUODENOSCOPY (EGD) WITH PROPOFOL N/A 01/21/2017   Procedure: ESOPHAGOGASTRODUODENOSCOPY (EGD) WITH PROPOFOL;  Surgeon: Jonathon Bellows, MD;  Location: [facility] ENDOSCOPY;  Service: Gastroenterology;  Laterality: N/A;  . FLEXIBLE SIGMOIDOSCOPY N/A 11/04/2016   Procedure: FLEXIBLE SIGMOIDOSCOPY;  Surgeon: Wilford Corner, MD;  Location: [facility] ENDOSCOPY;  Service: Endoscopy;  Laterality: N/A;  . LEFT HEART CATH AND CORONARY ANGIOGRAPHY N/A 04/05/2017   Procedure: LEFT HEART CATH AND CORONARY ANGIOGRAPHY;  Surgeon: Wellington Hampshire, MD;  Location: [place] CV LAB;  Service: Cardiovascular;  Laterality: N/A;  . LITHOTRIPSY     Family History  Problem Relation Age of Onset  . Lung cancer Mother   . Hypertension Father   . CAD Father        a. MI age 13  . COPD Neg Hx   . Diabetes Mellitus II Neg Hx    Social History   Tobacco Use  . Smoking status: Never Smoker  . Smokeless tobacco: Never Used  Substance Use Topics  . Alcohol use: No   No Known Allergies    Review of Systems  Constitutional: Positive for fatigue (with moderate exertion). Negative for appetite change and fever.  HENT: Positive for rhinorrhea. Negative for congestion and sore throat.   Eyes: Negative for pain.  Respiratory: Negative for cough, chest tightness, shortness of breath and wheezing.   Cardiovascular: Negative for chest pain, palpitations and leg swelling.  Gastrointestinal: Negative for abdominal distention and abdominal pain.  Endocrine: Negative.   Genitourinary: Negative.   Musculoskeletal: Negative for back pain and neck pain.  Skin: Positive for rash ("bug bites around ankles").  Allergic/Immunologic: Negative.   Neurological: Positive for headaches. Negative for dizziness and light-headedness.  Hematological: Negative for adenopathy. Does not bruise/bleed easily.  Psychiatric/Behavioral: Negative for dysphoric mood and sleep disturbance (sleeping  on 3 pillows for comfort). The patient is not nervous/anxious.      Physical Exam  Constitutional: She is oriented to person, place, and time. She appears well-developed and well-nourished.  HENT:  Head: Normocephalic and atraumatic.  Neck: Normal range of motion. Neck supple. No JVD present.  Cardiovascular: Normal rate and regular rhythm.   Pulmonary/Chest: Effort normal. She has no wheezes. She has no rales.  Abdominal: Soft. She exhibits no distension. There is no abdominal tenderness.  Musculoskeletal:        General: No tenderness or edema.  Neurological: She is alert and oriented to person, place, and time.  Skin: Skin is warm and dry. Rash noted. Rash is urticarial (bilateral lower legs (patient denies itching)).  Psychiatric: She has a normal mood and affect. Her behavior is normal. Thought content normal.  Nursing note and vitals reviewed.   Assessment & Plan:  1: Chronic heart failure with reduced ejection fraction- - NYHA class II - euvolemic today - now weighing daily and she was reminded to call for an overnight weight gain of >2 pounds or a weekly weight gain of >5 pounds - weight 113 pounds since her last visit here 5 months ago - not adding salt to her food and she was reminded to keep daily sodium intake to 2000mg /day  - consider titrating carvedilol/entresto at future visits  - had telemedicine visit with cardiology Rockey Situ) 10/08/2018 - BNP 04/03/17 was 1287.0  2: HTN- - BP  - BMP from 06/19/2018 reviewed and showed sodium 142, potassium 3.9, creatinine 0.92 and GFR 82 - went to [facility] 03/27/18  3: DM- - A1c 06/19/2018 was 12.8% - glucose at home this morning was    Patient did not bring her medications nor a list. Each medication was verbally reviewed with the patient and she was encouraged to bring the bottles to every visit to confirm accuracy of list.

## 2018-11-20 ENCOUNTER — Ambulatory Visit: Payer: Medicaid Other | Admitting: Family

## 2018-11-21 ENCOUNTER — Ambulatory Visit: Payer: Medicaid Other | Admitting: Family

## 2018-11-21 ENCOUNTER — Telehealth: Payer: Self-pay | Admitting: Family

## 2018-11-21 NOTE — Telephone Encounter (Signed)
Patient did not show for her Heart Failure Clinic appointment on 11/21/2018. Will attempt to reschedule.  

## 2019-01-13 ENCOUNTER — Other Ambulatory Visit: Payer: Self-pay

## 2019-01-13 ENCOUNTER — Ambulatory Visit (INDEPENDENT_AMBULATORY_CARE_PROVIDER_SITE_OTHER): Payer: Medicaid Other | Admitting: Nurse Practitioner

## 2019-01-13 ENCOUNTER — Encounter: Payer: Self-pay | Admitting: Nurse Practitioner

## 2019-01-13 VITALS — BP 157/97 | Ht 59.0 in | Wt 119.0 lb

## 2019-01-13 DIAGNOSIS — Z7689 Persons encountering health services in other specified circumstances: Secondary | ICD-10-CM | POA: Diagnosis not present

## 2019-01-13 DIAGNOSIS — E119 Type 2 diabetes mellitus without complications: Secondary | ICD-10-CM | POA: Diagnosis not present

## 2019-01-13 LAB — POCT GLYCOSYLATED HEMOGLOBIN (HGB A1C): Hemoglobin A1C: 10 % — AB (ref 4.0–5.6)

## 2019-01-13 MED ORDER — BYDUREON 2 MG ~~LOC~~ PEN: 2.0000 mg | PEN_INJECTOR | SUBCUTANEOUS | 5 refills | Status: DC

## 2019-01-13 MED ORDER — INSULIN GLARGINE 100 UNIT/ML SOLOSTAR PEN: 14.0000 [IU] | PEN_INJECTOR | Freq: Every day | SUBCUTANEOUS | 3 refills | Status: DC

## 2019-01-13 MED ORDER — METFORMIN HCL 500 MG PO TABS: 500.0000 mg | ORAL_TABLET | Freq: Every day | ORAL | 1 refills | Status: DC

## 2019-01-13 MED ORDER — ROSUVASTATIN CALCIUM 20 MG PO TABS: 20.0000 mg | ORAL_TABLET | Freq: Every day | ORAL | 3 refills | Status: DC

## 2019-01-13 NOTE — Patient Instructions (Signed)
Erin Hayes,   Thank you for coming in to clinic today.  1. STOP 70/30  2. START: - Lantus 14 units daily at bedtime - Bydureon 2 mg injection every 7 days. (Every Monday) - Metformin 500 mg once daily with breakfast.  Please schedule a follow-up appointment with Cassell Smiles, AGNP. No follow-ups on file.  If you have any other questions or concerns, please feel free to call the clinic or send a message through [place]. You may also schedule an earlier appointment if necessary.  You will receive a survey after today's visit either digitally by e-mail or paper by [employer]. Your experiences and feedback matter to [place].  Please respond so we know how we are doing as we provide care for you.   Cassell Smiles, DNP, AGNP-BC Adult Gerontology Nurse Practitioner [place]

## 2019-01-13 NOTE — Progress Notes (Signed)
Subjective:    Patient ID: Erin Hayes, female    DOB: 1966/05/04, 53 y.o.   MRN: [id number]  Erin Hayes is a 53 y.o. female presenting on 01/13/2019 for Establish Care (need refills on medication. Fasting bs ranging 138- 402. Been off insulin x 1 week.)   HPI Establish Care New Provider Pt last seen by PCP [facility] about 8 months ago.  Obtain records from [facility].    Diabetes Pt presents today for follow up of Type 2 diabetes mellitus. She is checking fasting am CBG at home with a range of 130-230 prior to running out of insulin about 1 week ago. - Current diabetic medications include: NPH insulin 20 units in am, 10 units in pm   Patient is insured by medicaid, which has other medication and insulin options. - She is not currently symptomatic.  Has had lows rarely since reducing evening dose, but was having poorly controlled am cbgs. - She denies polydipsia, polyphagia, polyuria, headaches, diaphoresis, shakiness, chills, pain, numbness or tingling in extremities and changes in vision.   - Clinical course has been stable. - She  reports an exercise routine that includes walk, 2 days per week. - Her diet is moderate in salt, moderate in fat, and moderate in carbohydrates. - Weight trend: stable  PREVENTION: Eye exam current (within one year): no Foot exam current (within one year): no  Lipid/ASCVD risk reduction - on statin: rosuvastatin 20 mg daily Kidney protection - on ace or arb: yes Delene Loll Recent Labs    05/22/18 0540 06/19/18 1905 01/13/19 1433  HGBA1C 14.9* 12.8* 10.0*     Past Medical History:  Diagnosis Date  . Carpal tunnel syndrome of right wrist   . Chronic combined systolic (congestive) and diastolic (congestive) heart failure ([place])    a. 03/2017 Echo: EF 20-25%, diff HK, Gr2 DD.  . Diabetes ([place])   . GERD (gastroesophageal reflux disease)   . HLD (hyperlipidemia)   . Hypertension   . NICM (nonischemic cardiomyopathy) ([place])    a. 03/2017 Echo:  EF 20-25%, diff HK, Gr2 DD, mild to mod MR, nl RV fxn;  b. 03/2017 Cath: mild nonobs dzs, EF 25%.  . Pleural effusion, left    a. 03/2017 s/p thoracentesis-->300 ml withdrawn--transudative.  . Pneumonia 02/19/2017   Past Surgical History:  Procedure Laterality Date  . COLONOSCOPY WITH PROPOFOL N/A 01/21/2017   Procedure: COLONOSCOPY WITH PROPOFOL;  Surgeon: Jonathon Bellows, MD;  Location: [facility] ENDOSCOPY;  Service: Gastroenterology;  Laterality: N/A;  . ESOPHAGOGASTRODUODENOSCOPY (EGD) WITH PROPOFOL N/A 01/21/2017   Procedure: ESOPHAGOGASTRODUODENOSCOPY (EGD) WITH PROPOFOL;  Surgeon: Jonathon Bellows, MD;  Location: [facility] ENDOSCOPY;  Service: Gastroenterology;  Laterality: N/A;  . FLEXIBLE SIGMOIDOSCOPY N/A 11/04/2016   Procedure: FLEXIBLE SIGMOIDOSCOPY;  Surgeon: Wilford Corner, MD;  Location: [facility] ENDOSCOPY;  Service: Endoscopy;  Laterality: N/A;  . LEFT HEART CATH AND CORONARY ANGIOGRAPHY N/A 04/05/2017   Procedure: LEFT HEART CATH AND CORONARY ANGIOGRAPHY;  Surgeon: Wellington Hampshire, MD;  Location: [place] CV LAB;  Service: Cardiovascular;  Laterality: N/A;  . LITHOTRIPSY     Social History   Socioeconomic History  . Marital status: Single    Spouse name: Not on file  . Number of children: 3  . Years of education: 51  . Highest education level: 12th grade  Occupational History  . Occupation: home maker  Social Needs  . Financial resource strain: Somewhat hard  . Food insecurity    Worry: Often true  Inability: Often true  . Transportation needs    Medical: Yes    Non-medical: Yes  Tobacco Use  . Smoking status: Never Smoker  . Smokeless tobacco: Never Used  Substance and Sexual Activity  . Alcohol use: No  . Drug use: No  . Sexual activity: Never  Lifestyle  . Physical activity    Days per week: 1 day    Minutes per session: 10 min  . Stress: Not at all  Relationships  . Social connections    Talks on phone: More than three times a week    Gets together: More than  three times a week    Attends religious service: More than 4 times per year    Active member of club or organization: Not on file    Attends meetings of clubs or organizations: Never    Relationship status: Not on file  . Intimate partner violence    Fear of current or ex partner: No    Emotionally abused: No    Physically abused: No    Forced sexual activity: No  Other Topics Concern  . Not on file  Social History Narrative  . Not on file   Family History  Problem Relation Age of Onset  . Lung cancer Mother   . Hypertension Father   . CAD Father        a. MI age 52  . Heart disease Father   . COPD Neg Hx   . Diabetes Mellitus II Neg Hx    Current Outpatient Medications on File Prior to Visit  Medication Sig  . albuterol (PROVENTIL HFA;VENTOLIN HFA) 108 (90 Base) MCG/ACT inhaler Inhale 2 puffs into the lungs every 4 (four) hours as needed for wheezing or shortness of breath.  [name] aspirin 81 MG tablet Take 81 mg by mouth daily.  . furosemide (LASIX) 20 MG tablet Take 1 tablet (20 mg total) by mouth daily.  . insulin NPH-regular Human (NOVOLIN 70/30) (70-30) 100 UNIT/ML injection 20 units in the am and 10 units at night.  . sacubitril-valsartan (ENTRESTO) 49-51 MG Take 1 tablet by mouth 2 (two) times daily.  [name] spironolactone (ALDACTONE) 25 MG tablet Take 1 tablet (25 mg total) by mouth daily.  [name] amoxicillin (AMOXIL) 500 MG capsule TAKE 2 CAPSULES BY MOUTH NOW THEN DECREASE TO 1 CAPSULE EVERY 8 HOURS UNTIL GONE  . carvedilol (COREG) 12.5 MG tablet Take 1 tablet (12.5 mg total) by mouth 2 (two) times daily.  . rosuvastatin (CRESTOR) 20 MG tablet Take 1 tablet (20 mg total) by mouth daily. (Patient not taking: Reported on 01/13/2019)   No current facility-administered medications on file prior to visit.     Review of Systems Per HPI unless specifically indicated above     Objective:    BP (!) 157/97 (BP Location: Right Arm, Patient Position: Sitting, Cuff Size: Normal)   Ht 4'  11" (1.499 m)   Wt 119 lb (54 kg)   BMI 24.04 kg/m   Wt Readings from Last 3 Encounters:  01/13/19 119 lb (54 kg)  06/19/18 115 lb (52.2 kg)  06/09/18 113 lb (51.3 kg)    Physical Exam Vitals signs reviewed.  Constitutional:      General: She is not in acute distress.    Appearance: She is well-developed.  HENT:     Head: Normocephalic and atraumatic.  Cardiovascular:     Rate and Rhythm: Normal rate and regular rhythm.     Pulses:  Radial pulses are 2+ on the right side and 2+ on the left side.       Posterior tibial pulses are 1+ on the right side and 1+ on the left side.     Heart sounds: S1 normal and S2 normal. Murmur (2/6 holosystolic murmur Decrescendo) present.  Pulmonary:     Effort: Pulmonary effort is normal. No respiratory distress.     Breath sounds: Normal breath sounds and air entry.  Musculoskeletal:     Right lower leg: No edema.     Left lower leg: No edema.  Skin:    General: Skin is warm and dry.     Capillary Refill: Capillary refill takes less than 2 seconds.  Neurological:     Mental Status: She is alert and oriented to person, place, and time.  Psychiatric:        Attention and Perception: Attention normal.        Mood and Affect: Mood and affect normal.        Behavior: Behavior normal. Behavior is cooperative.    Results for orders placed or performed in visit on 06/19/18  HgB A1c  Result Value Ref Range   Hgb A1c MFr Bld 12.8 (H) 4.8 - 5.6 %   Est. average glucose Bld gHb Est-mCnc 321 mg/dL  Comp Met (CMET)  Result Value Ref Range   Glucose 180 (H) 65 - 99 mg/dL   BUN 14 6 - 24 mg/dL   Creatinine, Ser 0.92 0.57 - 1.00 mg/dL   GFR calc non Af Amer 71 >59 mL/min/1.73   GFR calc Af Amer 82 >59 mL/min/1.73   BUN/Creatinine Ratio 15 9 - 23   Sodium 142 134 - 144 mmol/L   Potassium 3.9 3.5 - 5.2 mmol/L   Chloride 101 96 - 106 mmol/L   CO2 21 20 - 29 mmol/L   Calcium 9.0 8.7 - 10.2 mg/dL   Total Protein 6.2 6.0 - 8.5 g/dL   Albumin  3.8 3.8 - 4.9 g/dL   Globulin, Total 2.4 1.5 - 4.5 g/dL   Albumin/Globulin Ratio 1.6 1.2 - 2.2   Bilirubin Total 0.3 0.0 - 1.2 mg/dL   Alkaline Phosphatase 134 (H) 39 - 117 IU/L   AST 17 0 - 40 IU/L   ALT 21 0 - 32 IU/L  Lipid Profile  Result Value Ref Range   Cholesterol, Total 216 (H) 100 - 199 mg/dL   Triglycerides 158 (H) 0 - 149 mg/dL   HDL 53 >39 mg/dL   VLDL Cholesterol Cal 32 5 - 40 mg/dL   LDL Calculated 131 (H) 0 - 99 mg/dL   Chol/HDL Ratio 4.1 0.0 - 4.4 ratio  CBC  Result Value Ref Range   WBC 8.6 3.4 - 10.8 x10E3/uL   RBC 4.16 3.77 - 5.28 x10E6/uL   Hemoglobin 11.6 11.1 - 15.9 g/dL   Hematocrit 35.4 34.0 - 46.6 %   MCV 85 79 - 97 fL   MCH 27.9 26.6 - 33.0 pg   MCHC 32.8 31.5 - 35.7 g/dL   RDW 12.4 11.7 - 15.4 %   Platelets 435 150 - 450 x10E3/uL      Assessment & Plan:   Problem List Items Addressed This Visit      Endocrine   Diabetes mellitus without complication ([place]) - Primary UncontrolledDM with A1c 10.0 improved from >12 % and goal A1c < 8.0%. - Complications - hyperglycemia and hypoglycemia on NPH.  Plan:  1. Change therapy:  - STOP NPH -  START metformin 500 mg once daily with breakfast.  Limit dose due to HF status - START Lantus 14 units daily at bedtime.  May be able to reduce this in future - START Bydureon pen 2 mg one injection every 7 days.  If pen to cumbersome, will change to Victoza 2. Encourage improved lifestyle: - low carb/low glycemic diet encouraged - Increase physical activity to 30 minutes most days of the week.  Explained that increased physical activity increases body's use of sugar for energy. 3. Check fasting am CBG and bring log to next visit for review 4. Continue ASA, ARB and Statin 5. Advised to schedule DM ophtho exam, send record. 6. Follow-up 3 months    Relevant Medications   metFORMIN (GLUCOPHAGE) 500 MG tablet   Exenatide ER (BYDUREON) 2 MG PEN   Insulin Glargine (LANTUS) 100 UNIT/ML Solostar Pen   rosuvastatin  (CRESTOR) 20 MG tablet   Other Relevant Orders   POCT glycosylated hemoglobin (Hb A1C) (Completed)    Other Visit Diagnoses    Encounter to establish care     Previous PCP was at [facility].  Records are reviewed in [facility] be requested.  Past medical, family, and surgical history reviewed w/ pt.        Meds ordered this encounter  Medications  . metFORMIN (GLUCOPHAGE) 500 MG tablet    Sig: Take 1 tablet (500 mg total) by mouth daily with breakfast.    Dispense:  90 tablet    Refill:  1    Order Specific Question:   Supervising Provider    Answer:   Olin Hauser [[id number]]  . Exenatide ER (BYDUREON) 2 MG PEN    Sig: Inject 2 mg into the skin every 7 (seven) days.    Dispense:  4 each    Refill:  5    Order Specific Question:   Supervising Provider    Answer:   Olin Hauser [[id number]]  . Insulin Glargine (LANTUS) 100 UNIT/ML Solostar Pen    Sig: Inject 14 Units into the skin at bedtime.    Dispense:  15 mL    Refill:  3    Order Specific Question:   Supervising Provider    Answer:   Olin Hauser [[id number]]  . rosuvastatin (CRESTOR) 20 MG tablet    Sig: Take 1 tablet (20 mg total) by mouth daily.    Dispense:  90 tablet    Refill:  3    Order Specific Question:   Supervising Provider    Answer:   Olin Hauser [[id number]]   Follow up plan: Return in about 3 months (around 04/14/2019) for diabetes AND A1c.  Cassell Smiles, DNP, AGPCNP-BC Adult Gerontology Primary Care Nurse Practitioner [facility] 01/13/2019, 2:00 PM

## 2019-01-23 ENCOUNTER — Telehealth: Payer: Self-pay | Admitting: Nurse Practitioner

## 2019-01-23 ENCOUNTER — Encounter: Payer: Self-pay | Admitting: Nurse Practitioner

## 2019-01-23 DIAGNOSIS — R159 Full incontinence of feces: Secondary | ICD-10-CM | POA: Insufficient documentation

## 2019-01-23 NOTE — Telephone Encounter (Signed)
Order form received from [facility] for incontinence supplies.  Not previously discussed with patient at new patient appointment.  Patient today notes no urinary incontinence.  Patient only has fecal incontinence.  She sees Dr. Vicente Males with GI for management of diarrhea and incontinence.  Last visit with Dr. Vicente Males was September 2019.  Patient will need follow-up with GI prior to continuing incontinence supplies beyond 6 months from today.  It was noted that patient may have some reversible lifestyle complications that lead to increased diarrhea at last GI appointment.

## 2019-02-23 LAB — HM DIABETES EYE EXAM

## 2019-03-23 ENCOUNTER — Telehealth: Payer: Self-pay | Admitting: Cardiovascular Disease

## 2019-03-23 NOTE — Telephone Encounter (Signed)
° °  [facility] Pre-operative Risk Assessment    Request for surgical clearance:  1. What type of surgery is being performed? Fillings, cleanings, extractions   2. When is this surgery scheduled? TBD   3. What type of clearance is required (medical clearance vs. Pharmacy clearance to hold med vs. Both)?both   4. Are there any medications that need to be held prior to surgery and how long?  Please advise if needing to stop aspirin, plavix or coumadin  5. Practice name and name of physician performing surgery? [facility]   6. What is your office phone number [phone]   7.   What is your office fax number [phone]  8.   Anesthesia type (None, local, MAC, general) ? Not listed    Ace Gins 03/23/2019, 4:00 PM  _________________________________________________________________   (provider comments below)

## 2019-03-24 NOTE — Telephone Encounter (Signed)
Primary Cardiologist:Timothy Rockey Situ, MD  Chart reviewed as part of pre-operative protocol coverage. Because of Erin Hayes's past medical history and time since last visit, Erin Hayes will require a follow-up visit in order to better assess preoperative cardiovascular risk. Erin Hayes has not been seen since 2019.  Erin Hayes will need to see cardiology for pre-op evaluation along with Plavix and ASA recommendations, especially with multiple extractions.   Please put that patient is being seen for Pre-op evaluation in appointment notes.   Pre-op covering staff: - Please schedule appointment and call patient to inform them. - Please contact requesting surgeon's office via preferred method (i.e, phone, fax) to inform them of need for appointment prior to surgery.  If applicable, this message will also be routed to pharmacy pool and/or primary cardiologist for input on holding anticoagulant/antiplatelet agent as requested below so that this information is available at time of patient's appointment.   Jory Sims, NP  03/24/2019, 3:20 PM

## 2019-03-24 NOTE — Telephone Encounter (Signed)
Does patient need an appointment? She had telehealth with Dr. Rockey Situ in [place].

## 2019-03-24 NOTE — Telephone Encounter (Signed)
I called and spoke with Erin Hayes at [facility]. The plan is to extract all but 2 remaining teeth. This will not be done at the same time. There are 22 teeth that need to be extracted. Erin Hayes states that the most the would extract at one time would be 8. There are quite a few that are just root tips.

## 2019-03-24 NOTE — Telephone Encounter (Signed)
I called the dental office, they are aware that per patient she is not Plavix, Aspirin, or Coumadin. She does need an appointment for pre-op clearance per Jory Sims. She is a [facility] patient.

## 2019-03-24 NOTE — Telephone Encounter (Signed)
Per office protocol, no need to stop warfarin for cleaning, filling or single dental extractions. Please contact office prior to any other procedure needed to obtain additional clearane.  ASA and Plavix instructions per cardiologist

## 2019-03-24 NOTE — Telephone Encounter (Signed)
Pre-OP Call back team:   Can we please find out many dental extractions are going to be required for this patient before we clear her?   Curt Bears

## 2019-03-24 NOTE — Telephone Encounter (Signed)
Per chart review , this patient is NOT on warfarin, Eliquis, Xarelto or Pradaxa. (Confirmed with patient)  ASA and Plavix clearance per Dr Donivan Scull recommendation.

## 2019-03-24 NOTE — Telephone Encounter (Signed)
Called patient as she has not been seen since 2019 to discuss her current status and inquire about the planned dental procedure. No answer but did leave voice mail to call back to Pre-Op pool.

## 2019-03-24 NOTE — Telephone Encounter (Signed)
Yes she will need a follow up appointment to see Dr. Rockey Situ.

## 2019-03-25 NOTE — Telephone Encounter (Signed)
Pt has appt 04/08/19 with Danna Hefty, NP for surgery clearance. I will route clearance notes to NP for appt. I will fax notes to dental office  As FYI pt has appt. I will remove from the pre op call back pool.

## 2019-03-27 ENCOUNTER — Telehealth: Payer: Self-pay | Admitting: Cardiovascular Disease

## 2019-03-27 NOTE — Telephone Encounter (Signed)
   Primary Cardiologist: Ida Rogue, MD  Chart reviewed as part of pre-operative protocol coverage. Simple dental extractions are considered low risk procedures per guidelines and generally do not require any specific cardiac clearance. It is also generally accepted that for simple extractions and dental cleanings, there is no need to interrupt blood thinner therapy.  Dental cleaning and filling also low risk procedures.  SBE prophylaxis is not required for the patient.  I will route this recommendation to the requesting party via Epic fax function and remove from pre-op pool.  Please call with questions.  [place], [place] 03/27/2019, 6:11 PM

## 2019-03-27 NOTE — Telephone Encounter (Signed)
   [facility] Pre-operative Risk Assessment    Request for surgical clearance:  1. What type of surgery is being performed? FILLINGS, CLEANING, EXTRACTIONS  2. When is this surgery scheduled? TBD  3. What type of clearance is required (medical clearance vs. Pharmacy clearance to hold med vs. Both)? NOT LISTED  4. Are there any medications that need to be held prior to surgery and how long? NOT LISTED  Practice name and name of physician performing surgery? [facility] 5. What is your office phone number [phone]   7.   What is your office fax number [phone]  8.   Anesthesia type (None, local, MAC, general) ? NOT LISTED  Erin Hayes 03/27/2019, 3:21 PM  _________________________________________________________________   (provider comments below)

## 2019-04-08 ENCOUNTER — Ambulatory Visit (INDEPENDENT_AMBULATORY_CARE_PROVIDER_SITE_OTHER): Payer: Medicaid Other | Admitting: Nurse Practitioner

## 2019-04-08 ENCOUNTER — Other Ambulatory Visit: Payer: Self-pay

## 2019-04-08 ENCOUNTER — Other Ambulatory Visit
Admission: RE | Admit: 2019-04-08 | Discharge: 2019-04-08 | Disposition: A | Discharge status: Home / Self Care | Payer: Medicaid Other | Source: Ambulatory Visit | Attending: Nurse Practitioner | Admitting: Nurse Practitioner

## 2019-04-08 ENCOUNTER — Telehealth: Payer: Self-pay

## 2019-04-08 ENCOUNTER — Encounter: Payer: Self-pay | Admitting: Nurse Practitioner

## 2019-04-08 VITALS — BP 120/82 | HR 80 | Temp 97.3°F | Ht 59.0 in | Wt 124.0 lb

## 2019-04-08 DIAGNOSIS — I10 Essential (primary) hypertension: Secondary | ICD-10-CM

## 2019-04-08 DIAGNOSIS — I5020 Unspecified systolic (congestive) heart failure: Secondary | ICD-10-CM | POA: Insufficient documentation

## 2019-04-08 DIAGNOSIS — I428 Other cardiomyopathies: Secondary | ICD-10-CM

## 2019-04-08 DIAGNOSIS — E782 Mixed hyperlipidemia: Secondary | ICD-10-CM

## 2019-04-08 DIAGNOSIS — R9431 Abnormal electrocardiogram [ECG] [EKG]: Secondary | ICD-10-CM

## 2019-04-08 LAB — BASIC METABOLIC PANEL
Anion gap: 8 (ref 5–15)
BUN: 14 mg/dL (ref 6–20)
CO2: 24 mmol/L (ref 22–32)
Calcium: 9.1 mg/dL (ref 8.9–10.3)
Chloride: 106 mmol/L (ref 98–111)
Creatinine, Ser: 0.8 mg/dL (ref 0.44–1.00)
GFR calc Af Amer: >60 mL/min (ref >60)
GFR calc non Af Amer: >60 mL/min (ref >60)
Glucose, Bld: 167 mg/dL — ABNORMAL HIGH (ref 70–99)
Potassium: 4.7 mmol/L (ref 3.5–5.1)
Sodium: 138 mmol/L (ref 135–145)

## 2019-04-08 NOTE — Telephone Encounter (Signed)
-----   Message from Theora Gianotti, NP sent at 04/08/2019 11:38 AM EST ----- Renal fxn/lytes stable on current regimen.  No changes.

## 2019-04-08 NOTE — Telephone Encounter (Signed)
Call to patient to review labs. No further questions or orders at this time.   Advised pt to call for any further questions or concerns.

## 2019-04-08 NOTE — Patient Instructions (Signed)
Medication Instructions:  No changes  *If you need a refill on your cardiac medications before your next appointment, please call your pharmacy*  Lab Work: Please go to [facility] to have BMET done today.   If you have labs (blood work) drawn today and your tests are completely normal, you will receive your results only by: [name] MyChart Message (if you have MyChart) OR . A paper copy in the mail If you have any lab test that is abnormal or we need to change your treatment, we will call you to review the results.  Testing/Procedures: Your physician has requested that you have an echocardiogram. Echocardiography is a painless test that uses sound waves to create images of your heart. It provides your doctor with information about the size and shape of your heart and how well your heart's chambers and valves are working. This procedure takes approximately one hour. There are no restrictions for this procedure.    Follow-Up: At [facility], you and your health needs are our priority.  As part of our continuing mission to provide you with exceptional heart care, we have created designated Provider Care Teams.  These Care Teams include your primary Cardiologist (physician) and Advanced Practice Providers (APPs -  Physician Assistants and Nurse Practitioners) who all work together to provide you with the care you need, when you need it.  Your next appointment:   3 month(s)  The format for your next appointment:   In Person  Provider:    You may see Ida Rogue, MD or one of the following Advanced Practice Providers on your designated Care Team:    Murray Hodgkins, NP  Christell Faith, PA-C  Marrianne Mood, PA-C

## 2019-04-08 NOTE — Progress Notes (Signed)
Office Visit    Patient Name: Erin Hayes Date of Encounter: 04/08/2019  Primary Care Provider:  Mikey College, NP (Inactive) Primary Cardiologist:  Ida Rogue, MD  Chief Complaint    53 y/o ? w/ a h/o NICM, chronic combined syst/diast CHF, DMII, HTN, HL, prolonged QT chronic diarrhea, and GERD, who presents for f/u of CHF and preop eval pending tooth extractions.  Past Medical History    Past Medical History:  Diagnosis Date  . Carpal tunnel syndrome of right wrist   . Chronic combined systolic (congestive) and diastolic (congestive) heart failure ([place])    a. 03/2017 Echo: EF 20-25%, diff HK, Gr2 DD; b. 09/2017 Echo: EF 20-25%, diff HK, Gr2 DD.  . Diabetes ([name])   . GERD (gastroesophageal reflux disease)   . HLD (hyperlipidemia)   . Hypertension   . NICM (nonischemic cardiomyopathy) ([place])    a. 03/2017 Echo: EF 20-25%, diff HK, Gr2 DD, mild to mod MR, nl RV fxn;  b. 03/2017 Cath: mild nonobs dzs, EF 25%; c. 09/2017 Echo: EF 20-25%, diff HK. Gr2 DD. Mild MR. Mildly reduced RV fxn.  . Pleural effusion, left    a. 03/2017 s/p thoracentesis-->300 ml withdrawn--transudative.  . Pneumonia 02/19/2017   Past Surgical History:  Procedure Laterality Date  . COLONOSCOPY WITH PROPOFOL N/A 01/21/2017   Procedure: COLONOSCOPY WITH PROPOFOL;  Surgeon: Jonathon Bellows, MD;  Location: [facility] ENDOSCOPY;  Service: Gastroenterology;  Laterality: N/A;  . ESOPHAGOGASTRODUODENOSCOPY (EGD) WITH PROPOFOL N/A 01/21/2017   Procedure: ESOPHAGOGASTRODUODENOSCOPY (EGD) WITH PROPOFOL;  Surgeon: Jonathon Bellows, MD;  Location: [facility] ENDOSCOPY;  Service: Gastroenterology;  Laterality: N/A;  . FLEXIBLE SIGMOIDOSCOPY N/A 11/04/2016   Procedure: FLEXIBLE SIGMOIDOSCOPY;  Surgeon: Wilford Corner, MD;  Location: [facility] ENDOSCOPY;  Service: Endoscopy;  Laterality: N/A;  . LEFT HEART CATH AND CORONARY ANGIOGRAPHY N/A 04/05/2017   Procedure: LEFT HEART CATH AND CORONARY ANGIOGRAPHY;  Surgeon: Wellington Hampshire, MD;   Location: [place] CV LAB;  Service: Cardiovascular;  Laterality: N/A;  . LITHOTRIPSY      Allergies  No Known Allergies  History of Present Illness    53 y/o ? w/ the above complex PMH including DMII, HTN, HL, chronic diarrhea, GERD, and NICM w/ chronic combined syst/diast CHF.  In 02/2017, she was admitted to [facility] w/ PNA and a month later, was readmitted w/ dyspnea and lower ext edema.  Echo showed EF of 20-25% and Gr2 DD.  Cath showed minimal, nonobs CAD.  She has been medically managed w/  blocker, entresto, and spiro, and followed in CHF clinic since.  F/u echo in 09/2017 cont to show LV dysfxn w/ an EF of 20-25%.  She has been doing reasonably well at home.  She reports compliance with her medications and only occasionally notes mild lower extremity swelling, at which point she would take an additional 20 mg of Lasix.  She did have to do this recently after the Thanksgiving holiday.  She has multiple dental caries and is pending tooth extractions.  She notes a sedentary lifestyle but is able to walk up steps and around the grocery store without any chest pain or significant activity limiting dyspnea.  She denies PND, orthopnea, palpitations, dizziness, syncope, edema, or early satiety.  Home Medications    Prior to Admission medications   Medication Sig Start Date End Date Taking? Authorizing Provider  albuterol (PROVENTIL HFA;VENTOLIN HFA) 108 (90 Base) MCG/ACT inhaler Inhale 2 puffs into the lungs every 4 (four) hours as needed for wheezing  or shortness of breath. 04/26/17   Alisa Graff, FNP  amoxicillin (AMOXIL) 500 MG capsule TAKE 2 CAPSULES BY MOUTH NOW THEN DECREASE TO 1 CAPSULE EVERY 8 HOURS UNTIL GONE 01/08/19   [provider]  aspirin 81 MG tablet Take 81 mg by mouth daily.    [provider]  carvedilol (COREG) 12.5 MG tablet Take 1 tablet (12.5 mg total) by mouth 2 (two) times daily. 06/09/18 09/07/18  Alisa Graff, FNP  Exenatide ER (BYDUREON) 2 MG  PEN Inject 2 mg into the skin every 7 (seven) days. 01/13/19   Mikey College, NP  furosemide (LASIX) 20 MG tablet Take 1 tablet (20 mg total) by mouth daily. 06/09/18   Alisa Graff, FNP  Insulin Glargine (LANTUS) 100 UNIT/ML Solostar Pen Inject 14 Units into the skin at bedtime. 01/13/19   Mikey College, NP  metFORMIN (GLUCOPHAGE) 500 MG tablet Take 1 tablet (500 mg total) by mouth daily with breakfast. 01/13/19   Mikey College, NP  rosuvastatin (CRESTOR) 20 MG tablet Take 1 tablet (20 mg total) by mouth daily. 01/13/19   Mikey College, NP  sacubitril-valsartan (ENTRESTO) 49-51 MG Take 1 tablet by mouth 2 (two) times daily. 11/18/18   Alisa Graff, FNP  spironolactone (ALDACTONE) 25 MG tablet Take 1 tablet (25 mg total) by mouth daily. 03/04/18   Alisa Graff, FNP    Review of Systems    Overall doing well with only occasional, mild lower extremity swelling.  She denies chest pain, palpitations, PND, orthopnea, dizziness, syncope, or early satiety.  At higher levels of activity, she does experience dyspnea on exertion but overall does fairly well.  All other systems reviewed and are otherwise negative except as noted above.  Physical Exam    VS:  BP 120/82 (BP Location: Left Arm, Patient Position: Sitting, Cuff Size: Normal)   Pulse 80   Temp (!) 97.3 F (36.3 C)   Ht 4\' 11"  (1.499 m)   Wt 124 lb (56.2 kg)   BMI 25.04 kg/m  , BMI Body mass index is 25.04 kg/m. GEN: Well nourished, well developed, in no acute distress. HEENT: normal. Neck: Supple, no JVD, carotid bruits, or masses. Cardiac: RRR, no murmurs, rubs, or gallops. No clubbing, cyanosis, edema.  Radials/PT 1+ and equal bilaterally.  Respiratory:  Respirations regular and unlabored, clear to auscultation bilaterally. GI: Soft, nontender, nondistended, BS + x 4. MS: no deformity or atrophy. Skin: warm and dry, no rash. Neuro:  Strength and sensation are intact. Psych: Normal affect.  Accessory  Clinical Findings    ECG personally reviewed by me today -regular sinus rhythm, 80, left axis deviation, anterolateral infarct, prolonged QT- no acute changes.  Lab Results  Component Value Date   WBC 8.6 06/19/2018   HGB 11.6 06/19/2018   HCT 35.4 06/19/2018   MCV 85 06/19/2018   PLT 435 06/19/2018   Lab Results  Component Value Date   CREATININE 0.92 06/19/2018   BUN 14 06/19/2018   NA 142 06/19/2018   K 3.9 06/19/2018   CL 101 06/19/2018   CO2 21 06/19/2018   Lab Results  Component Value Date   ALT 21 06/19/2018   AST 17 06/19/2018   ALKPHOS 134 (H) 06/19/2018   BILITOT 0.3 06/19/2018   Lab Results  Component Value Date   CHOL 216 (H) 06/19/2018   HDL 53 06/19/2018   LDLCALC 131 (H) 06/19/2018   TRIG 158 (H) 06/19/2018   CHOLHDL 4.1  06/19/2018    Lab Results  Component Value Date   HGBA1C 10.0 (A) 01/13/2019    Assessment & Plan    1.  Nonischemic cardiomyopathy/HFrEF: Diagnosed in October 2018 with follow-up echocardiogram in May 2019 continue to show LV dysfunction.  Despite this, she is well compensated and euvolemic on examination today.  She generally does quite well at home without significant limitations in normal activities.  She reports compliance with medications including beta-blocker, Entresto, spironolactone, and furosemide.  I will arrange for a follow-up echocardiogram to reevaluate LV function on guideline directed medical therapy and if EF remains depressed, I will refer to electrophysiology for consideration of AICD.  She is agreeable with this plan.  Follow-up basic metabolic panel today as it has been 10 months since her last.  2.  Preoperative cardiovascular evaluation: Patient pending multiple tooth extractions.  As noted, she has been doing fairly well from a cardiovascular standpoint.  She is euvolemic on examination today.  She denies chest pain and prior catheterization did not show any significant coronary artery disease.  She is able to walk  up steps and ambulate around the grocery store without significant limitations or symptoms.  Risk of major cardiac event calculates to 6.6%.  She may proceed to tooth extraction without any additional ischemic testing at this time.  She may hold aspirin at the discretion of the surgical team.  She should continue on beta-blocker and statin therapy throughout the perioperative period.  If indicated, will need to watch IV fluids closely.  Finally, if she requires perioperative antibiotics, will need to avoid those agents known to prolong QT in the setting of baseline QT prolongation.  3.  Essential hypertension: Stable.  4.  Hyperlipidemia: LDL was 48 in August 2018 but then 131 in February 2020.  Suspect a period of noncompliance.  She reports compliance at this time.  Follow-up lipids with annual physical next year per primary care.  5.  Type 2 diabetes mellitus: A1c was 10.0 in September.  She is on Metformin and Lantus and reports compliance.  Management per primary care.  6.  Prolonged QT: This has been stable over time with a QT of 454 today (QTC 523).  Avoid QT prolonging agents.  7.  Disposition: Follow-up basic metabolic panel today.  Follow-up echocardiogram to reassess LV function in the setting of reported compliance with guideline directed medical therapy.  If EF remains less than 35%, will refer to electrophysiology for device consideration.  Murray Hodgkins, NP 04/08/2019, 8:20 AM

## 2019-04-19 ENCOUNTER — Emergency Department: Payer: Medicaid Other

## 2019-04-19 ENCOUNTER — Other Ambulatory Visit: Payer: Self-pay

## 2019-04-19 ENCOUNTER — Emergency Department
Admission: EM | Admit: 2019-04-19 | Discharge: 2019-04-19 | Disposition: A | Discharge status: Home / Self Care | Payer: Medicaid Other | Attending: Emergency Medicine | Admitting: Emergency Medicine

## 2019-04-19 ENCOUNTER — Encounter: Payer: Self-pay | Admitting: Emergency Medicine

## 2019-04-19 DIAGNOSIS — Y92019 Unspecified place in single-family (private) house as the place of occurrence of the external cause: Secondary | ICD-10-CM | POA: Diagnosis not present

## 2019-04-19 DIAGNOSIS — I110 Hypertensive heart disease with heart failure: Secondary | ICD-10-CM | POA: Diagnosis not present

## 2019-04-19 DIAGNOSIS — Y9301 Activity, walking, marching and hiking: Secondary | ICD-10-CM | POA: Insufficient documentation

## 2019-04-19 DIAGNOSIS — M7918 Myalgia, other site: Secondary | ICD-10-CM

## 2019-04-19 DIAGNOSIS — I5042 Chronic combined systolic (congestive) and diastolic (congestive) heart failure: Secondary | ICD-10-CM | POA: Diagnosis not present

## 2019-04-19 DIAGNOSIS — Y999 Unspecified external cause status: Secondary | ICD-10-CM | POA: Insufficient documentation

## 2019-04-19 DIAGNOSIS — M79672 Pain in left foot: Secondary | ICD-10-CM | POA: Diagnosis not present

## 2019-04-19 DIAGNOSIS — E119 Type 2 diabetes mellitus without complications: Secondary | ICD-10-CM | POA: Diagnosis not present

## 2019-04-19 DIAGNOSIS — Z7982 Long term (current) use of aspirin: Secondary | ICD-10-CM | POA: Diagnosis not present

## 2019-04-19 DIAGNOSIS — W010XXA Fall on same level from slipping, tripping and stumbling without subsequent striking against object, initial encounter: Secondary | ICD-10-CM | POA: Diagnosis not present

## 2019-04-19 DIAGNOSIS — Z794 Long term (current) use of insulin: Secondary | ICD-10-CM | POA: Diagnosis not present

## 2019-04-19 MED ORDER — MELOXICAM 15 MG PO TABS: 15.0000 mg | ORAL_TABLET | Freq: Every day | ORAL | 1 refills | Status: AC

## 2019-04-19 NOTE — ED Provider Notes (Signed)
Emergency Department Provider Note  ____________________________________________  Time seen: Approximately 8:11 PM  I have reviewed the triage vital signs and the nursing notes.   HISTORY  Chief Complaint Fall   Historian Patient    HPI Erin Hayes is a 53 y.o. female presents to the emergency department with left foot pain.  Patient tripped and fell last night and has had diffuse pain along the dorsal and plantar aspect of the foot since injury occurred.  She did not hit her head or neck.  She has been able to ambulate with some pain.  No numbness or tingling in the left foot.  She has not noticed any ecchymosis, lacerations or abrasions.  No other alleviating measures have been attempted.   Past Medical History:  Diagnosis Date  . Carpal tunnel syndrome of right wrist   . Chronic combined systolic (congestive) and diastolic (congestive) heart failure ([place])    a. 03/2017 Echo: EF 20-25%, diff HK, Gr2 DD; b. 09/2017 Echo: EF 20-25%, diff HK, Gr2 DD.  . Diabetes ([place])   . GERD (gastroesophageal reflux disease)   . HLD (hyperlipidemia)   . Hypertension   . NICM (nonischemic cardiomyopathy) ([place])    a. 03/2017 Echo: EF 20-25%, diff HK, Gr2 DD, mild to mod MR, nl RV fxn;  b. 03/2017 Cath: mild nonobs dzs, EF 25%; c. 09/2017 Echo: EF 20-25%, diff HK. Gr2 DD. Mild MR. Mildly reduced RV fxn.  . Pleural effusion, left    a. 03/2017 s/p thoracentesis-->300 ml withdrawn--transudative.  . Pneumonia 02/19/2017     Immunizations up to date:  Yes.     Past Medical History:  Diagnosis Date  . Carpal tunnel syndrome of right wrist   . Chronic combined systolic (congestive) and diastolic (congestive) heart failure ([place])    a. 03/2017 Echo: EF 20-25%, diff HK, Gr2 DD; b. 09/2017 Echo: EF 20-25%, diff HK, Gr2 DD.  . Diabetes ([place])   . GERD (gastroesophageal reflux disease)   . HLD (hyperlipidemia)   . Hypertension   . NICM (nonischemic cardiomyopathy) ([place])    a. 03/2017 Echo: EF  20-25%, diff HK, Gr2 DD, mild to mod MR, nl RV fxn;  b. 03/2017 Cath: mild nonobs dzs, EF 25%; c. 09/2017 Echo: EF 20-25%, diff HK. Gr2 DD. Mild MR. Mildly reduced RV fxn.  . Pleural effusion, left    a. 03/2017 s/p thoracentesis-->300 ml withdrawn--transudative.  . Pneumonia 02/19/2017    Patient Active Problem List   Diagnosis Date Noted  . Fecal incontinence 01/23/2019  . NICM (nonischemic cardiomyopathy) ([place]) 08/22/2017  . CHF (congestive heart failure) ([place]) 04/03/2017  . Protein-calorie malnutrition, severe 11/02/2016  . Hypokalemia 04/28/2013  . Leukocytosis, unspecified 04/28/2013  . Diabetes mellitus without complication ([place])   . Hypertension   . Hypercholesterolemia 11/10/2012    Past Surgical History:  Procedure Laterality Date  . COLONOSCOPY WITH PROPOFOL N/A 01/21/2017   Procedure: COLONOSCOPY WITH PROPOFOL;  Surgeon: Jonathon Bellows, MD;  Location: [facility] ENDOSCOPY;  Service: Gastroenterology;  Laterality: N/A;  . ESOPHAGOGASTRODUODENOSCOPY (EGD) WITH PROPOFOL N/A 01/21/2017   Procedure: ESOPHAGOGASTRODUODENOSCOPY (EGD) WITH PROPOFOL;  Surgeon: Jonathon Bellows, MD;  Location: [facility] ENDOSCOPY;  Service: Gastroenterology;  Laterality: N/A;  . FLEXIBLE SIGMOIDOSCOPY N/A 11/04/2016   Procedure: FLEXIBLE SIGMOIDOSCOPY;  Surgeon: Wilford Corner, MD;  Location: [facility] ENDOSCOPY;  Service: Endoscopy;  Laterality: N/A;  . LEFT HEART CATH AND CORONARY ANGIOGRAPHY N/A 04/05/2017   Procedure: LEFT HEART CATH AND CORONARY ANGIOGRAPHY;  Surgeon: Wellington Hampshire, MD;  Location: [facility]  INVASIVE CV LAB;  Service: Cardiovascular;  Laterality: N/A;  . LITHOTRIPSY      Prior to Admission medications   Medication Sig Start Date End Date Taking? Authorizing Provider  albuterol (PROVENTIL HFA;VENTOLIN HFA) 108 (90 Base) MCG/ACT inhaler Inhale 2 puffs into the lungs every 4 (four) hours as needed for wheezing or shortness of breath. 04/26/17   Alisa Graff, FNP  amoxicillin (AMOXIL) 500 MG capsule  TAKE 2 CAPSULES BY MOUTH NOW THEN DECREASE TO 1 CAPSULE EVERY 8 HOURS UNTIL GONE 01/08/19   [provider]  aspirin 81 MG tablet Take 81 mg by mouth daily.    [provider]  carvedilol (COREG) 12.5 MG tablet Take 1 tablet (12.5 mg total) by mouth 2 (two) times daily. 06/09/18 04/08/19  Alisa Graff, FNP  Exenatide ER (BYDUREON) 2 MG PEN Inject 2 mg into the skin every 7 (seven) days. 01/13/19   Mikey College, NP  furosemide (LASIX) 20 MG tablet Take 1 tablet (20 mg total) by mouth daily. 06/09/18   Alisa Graff, FNP  Insulin Glargine (LANTUS) 100 UNIT/ML Solostar Pen Inject 14 Units into the skin at bedtime. 01/13/19   Mikey College, NP  meloxicam (MOBIC) 15 MG tablet Take 1 tablet (15 mg total) by mouth daily for 7 days. 04/19/19 04/26/19  Lannie Fields, PA-C  metFORMIN (GLUCOPHAGE) 500 MG tablet Take 1 tablet (500 mg total) by mouth daily with breakfast. 01/13/19   Mikey College, NP  rosuvastatin (CRESTOR) 20 MG tablet Take 1 tablet (20 mg total) by mouth daily. 01/13/19   Mikey College, NP  sacubitril-valsartan (ENTRESTO) 49-51 MG Take 1 tablet by mouth 2 (two) times daily. 11/18/18   Alisa Graff, FNP  spironolactone (ALDACTONE) 25 MG tablet Take 1 tablet (25 mg total) by mouth daily. 03/04/18   Alisa Graff, FNP    Allergies Patient has no known allergies.  Family History  Problem Relation Age of Onset  . Lung cancer Mother   . Hypertension Father   . CAD Father        a. MI age 22  . Heart disease Father   . COPD Neg Hx   . Diabetes Mellitus II Neg Hx     Social History Social History   Tobacco Use  . Smoking status: Never Smoker  . Smokeless tobacco: Never Used  Substance Use Topics  . Alcohol use: No  . Drug use: No     Review of Systems  Constitutional: No fever/chills Eyes:  No discharge ENT: No upper respiratory complaints. Respiratory: no cough. No SOB/ use of accessory muscles to breath Gastrointestinal:   No  nausea, no vomiting.  No diarrhea.  No constipation. Musculoskeletal: Patient has foot pain.  Skin: Negative for rash, abrasions, lacerations, ecchymosis.  ____________________________________________   PHYSICAL EXAM:  VITAL SIGNS: ED Triage Vitals  Enc Vitals Group     BP 04/19/19 1755 (!) 138/91     Pulse Rate 04/19/19 1755 87     Resp 04/19/19 1755 16     Temp 04/19/19 1755 99.1 F (37.3 C)     Temp Source 04/19/19 1755 Oral     SpO2 04/19/19 1755 100 %     Weight 04/19/19 1752 123 lb 14.4 oz (56.2 kg)     Height 04/19/19 1752 4\' 11"  (1.499 m)     Head Circumference --      Peak Flow --      Pain Score 04/19/19 1752 8  Pain Loc --      Pain Edu? --      Excl. in [place]? --      Constitutional: Alert and oriented. Well appearing and in no acute distress. Eyes: Conjunctivae are normal. PERRL. EOMI. Head: Atraumatic. ENT: Cardiovascular: Normal rate, regular rhythm. Normal S1 and S2.  Good peripheral circulation. Respiratory: Normal respiratory effort without tachypnea or retractions. Lungs CTAB. Good air entry to the bases with no decreased or absent breath sounds Gastrointestinal: Bowel sounds x 4 quadrants. Soft and nontender to palpation. No guarding or rigidity. No distention. Musculoskeletal: Patient performs full range of motion at the left ankle.  She does have diffuse tenderness to palpation along the plantar aspect of the foot and along the metatarsals.  Palpable dorsalis pedis pulse, left.  Capillary refill less than 2 seconds on the left. Neurologic:  Normal for age. No gross focal neurologic deficits are appreciated.  Skin:  Skin is warm, dry and intact. No rash noted. Psychiatric: Mood and affect are normal for age. Speech and behavior are normal.   ____________________________________________   LABS (all labs ordered are listed, but only abnormal results are displayed)  Labs Reviewed - No data to  display ____________________________________________  EKG   ____________________________________________  RADIOLOGY Unk Pinto, personally viewed and evaluated these images (plain radiographs) as part of my medical decision making, as well as reviewing the written report by the radiologist.  DG Foot Complete Left  Result Date: 04/19/2019 CLINICAL DATA:  Tripped and fell last night, c/o left foot pain EXAM: LEFT FOOT - COMPLETE 3+ VIEW COMPARISON:  None. FINDINGS: Diffuse osteopenia. There is no evidence of fracture or dislocation. There are scattered mild degenerative changes. Soft tissues are unremarkable. IMPRESSION: No acute fracture or dislocation of the left foot. Diffuse osteopenia. Electronically Signed   By: Audie Pinto M.D.   On: 04/19/2019 18:52    ____________________________________________    PROCEDURES  Procedure(s) performed:     Procedures     Medications - No data to display   ____________________________________________   INITIAL IMPRESSION / ASSESSMENT AND PLAN / ED COURSE  Pertinent labs & imaging results that were available during my care of the patient were reviewed by me and considered in my medical decision making (see chart for details).      Assessment and plan Foot pain 53 year old female presents to the emergency department with acute left foot pain after she fell last night.  Vital signs are reassuring at triage.  On physical exam, patient had diffuse tenderness along the dorsal and plantar aspect of the left foot.  No bony abnormality was visualized on x-ray.  She was discharged with meloxicam and ice was recommended.  Return precautions were given.  Patient was advised to follow-up with Dr. Cleda Mccreedy as needed.  All patient questions were answered.   ____________________________________________  FINAL CLINICAL IMPRESSION(S) / ED DIAGNOSES  Final diagnoses:  Musculoskeletal pain      NEW MEDICATIONS STARTED DURING THIS  VISIT:  ED Discharge Orders         Ordered    meloxicam (MOBIC) 15 MG tablet  Daily     04/19/19 2009              This chart was dictated using voice recognition software/Dragon. Despite best efforts to proofread, errors can occur which can change the meaning. Any change was purely unintentional.     Lannie Fields, PA-C 04/19/19 2015    Nance Pear, MD 04/19/19 2102

## 2019-04-19 NOTE — ED Triage Notes (Signed)
Tripped and fell last night, c/o left foot pain

## 2019-04-19 NOTE — Discharge Instructions (Signed)
Make sure to use arch supports in your shoes. Apply ice nightly 15 minutes at a time for 2 to 3 hours. Please follow-up with podiatry if pain persist.

## 2019-04-19 NOTE — ED Triage Notes (Signed)
Pt comes into the ED via EMS from home with c/o fall last night and having left leg pain,.

## 2019-05-13 ENCOUNTER — Other Ambulatory Visit: Payer: Medicaid Other

## 2019-05-18 ENCOUNTER — Other Ambulatory Visit: Payer: Self-pay | Admitting: Family

## 2019-05-18 ENCOUNTER — Other Ambulatory Visit: Payer: Self-pay

## 2019-05-18 ENCOUNTER — Ambulatory Visit (INDEPENDENT_AMBULATORY_CARE_PROVIDER_SITE_OTHER): Payer: Medicaid Other | Admitting: Family Medicine

## 2019-05-18 ENCOUNTER — Encounter: Payer: Self-pay | Admitting: Family Medicine

## 2019-05-18 VITALS — BP 124/86 | HR 85 | Temp 98.0°F | Ht 59.0 in | Wt 117.2 lb

## 2019-05-18 DIAGNOSIS — E1165 Type 2 diabetes mellitus with hyperglycemia: Secondary | ICD-10-CM | POA: Diagnosis not present

## 2019-05-18 DIAGNOSIS — I5022 Chronic systolic (congestive) heart failure: Secondary | ICD-10-CM

## 2019-05-18 DIAGNOSIS — M79672 Pain in left foot: Secondary | ICD-10-CM

## 2019-05-18 DIAGNOSIS — Z23 Encounter for immunization: Secondary | ICD-10-CM

## 2019-05-18 LAB — POCT GLYCOSYLATED HEMOGLOBIN (HGB A1C): Hemoglobin A1C: 10 % — AB (ref 4.0–5.6)

## 2019-05-18 MED ORDER — SPIRONOLACTONE 25 MG PO TABS: 25.0000 mg | ORAL_TABLET | Freq: Every day | ORAL | 0 refills | Status: DC

## 2019-05-18 NOTE — Progress Notes (Signed)
Subjective:    Patient ID: [name], female    DOB: Nov 22, 1965, 54 y.o.   MRN: [id number]  EVAMAE PLUCHINO is a 54 y.o. female presenting on 05/18/2019 for Diabetes (intermittent headaches, only when she wake up. She state its beeen going on for about 2.5 weeks and that its not severe. ) and Foot Injury ( Patient tripped and fell x 92mth ago and injured her left foot. She was seen at the emergency room, Xray showed No acute fracture or dislocation of the left foot.)   HPI  CHRONIC DM, Type 2: Reports no concerns. She had prior A1c 14 down to 12 then last reading 10. CBGs: Avg 120-130, Low >100, High < 200. Checks CBGs daily x1 in AM Meds: Lantus 14u daily, Bydureon 2mg  weekly, Metformin IR 500mg  daily Reports good compliance. Tolerating well w/o side-effects Currently on ACEi Lifestyle: - Diet (improving diet, )  Needs DM Foot exam today Denies hypoglycemia, polyuria, visual changes, numbness or tingling.  Left Foot Pain Prior injury >6 months ago, injured Left 5th toe thought she broke it at the time, did not seek medical care, now recently seen 04/2019 had X-ray showed no fracture see results below - Still causing her pain, she tried to schedule w/ Podiatry but needed referral  Headaches, intermittent frontal Tension symptoms described frontal bilateral temple area, seems chronic episodic problem, worse in AM and improves with Tylenol, rest and eating as well. History of migraines in the past but no other symptoms of migraine now.  Health Maintenance: Due for Flu Shot, will receive today    Depression screen [facility] 2/9 05/18/2019 03/04/2018 09/03/2017  Decreased Interest 0 0 0  Down, Depressed, Hopeless 0 0 0  PHQ - 2 Score 0 0 0    Social History   Tobacco Use  . Smoking status: Never Smoker  . Smokeless tobacco: Never Used  Substance Use Topics  . Alcohol use: No  . Drug use: No    Review of Systems Per HPI unless specifically indicated above     Objective:      BP 124/86   Pulse 85   Temp 98 F (36.7 C) (Oral)   Ht 4\' 11"  (1.499 m)   Wt 117 lb 3.2 oz (53.2 kg)   BMI 23.67 kg/m   Wt Readings from Last 3 Encounters:  05/18/19 117 lb 3.2 oz (53.2 kg)  04/19/19 123 lb 14.4 oz (56.2 kg)  04/08/19 124 lb (56.2 kg)    Physical Exam Vitals and nursing note reviewed.  Constitutional:      General: She is not in acute distress.    Appearance: She is well-developed. She is not diaphoretic.     Comments: Well-appearing, comfortable, cooperative  HENT:     Head: Normocephalic and atraumatic.  Eyes:     General:        Right eye: No discharge.        Left eye: No discharge.     Conjunctiva/sclera: Conjunctivae normal.  Cardiovascular:     Rate and Rhythm: Normal rate.  Pulmonary:     Effort: Pulmonary effort is normal.  Skin:    General: Skin is warm and dry.     Findings: No erythema or rash.  Neurological:     Mental Status: She is alert and oriented to person, place, and time.  Psychiatric:        Behavior: Behavior normal.     Comments: Well groomed, good eye contact, normal speech and thoughts  Diabetic Foot Exam - Simple   Simple Foot Form Diabetic Foot exam was performed with the following findings: Yes 05/18/2019 11:45 AM  Visual Inspection See comments: Yes Sensation Testing Intact to touch and monofilament testing bilaterally: Yes Pulse Check Posterior Tibialis and Dorsalis pulse intact bilaterally: Yes Comments Bilateral feet with dry skin, no ulceration, Left 5th MTP tender to touch, no reduced sensation to monofilament.    I have personally reviewed the radiology report from 04/19/19.  CLINICAL DATA:  Tripped and fell last night, c/o left foot pain  EXAM: LEFT FOOT - COMPLETE 3+ VIEW  COMPARISON:  None.  FINDINGS: Diffuse osteopenia. There is no evidence of fracture or dislocation. There are scattered mild degenerative changes. Soft tissues are unremarkable.  IMPRESSION: No acute fracture or  dislocation of the left foot. Diffuse osteopenia.   Electronically Signed   By: Audie Pinto M.D.   On: 04/19/2019 18:52   Results for orders placed or performed in visit on 05/18/19  POCT HgB A1C  Result Value Ref Range   Hemoglobin A1C 10.0 (A) 4.0 - 5.6 %      Assessment & Plan:   Problem List Items Addressed This Visit    Uncontrolled type 2 diabetes mellitus with hyperglycemia ([place]) - Primary   Relevant Medications   glucose blood (CONTOUR TEST) test strip   Other Relevant Orders   POCT HgB A1C (Completed)   Ambulatory referral to Podiatry    Other Visit Diagnoses    Left foot pain       Relevant Orders   Ambulatory referral to Podiatry   Needs flu shot       Relevant Orders   [facility] - Flu Vaccine QUAD 36+ mos PF IM (Fluarix & Fluzone Quad PF) (Completed)      #DM2, uncontrolled Complicated by hyperglycemia A1c 10, still uncontrolled, but previously 12-14, has still improved on Bydureon  Increase Lantus from 14 up to 16units daily and titrate instruction as advised, if CBG fasting >150 can increase by 1 per week, if < 120 on avg can reduce by 1 per week  Limited metformin dosing 500mg  daily due to CHF history  Future consider add SGLT2 medication in future if indicated if A1c still not responding, would use [place] or Jardiance whichever preferred by Medicaid at that time.  Continue Bydureon 2mg  weekly,  ------   #Foot pain, L 5th toe forefoot/MTP Prior injury 6 months ago X-ray 04/2019 negative for fracture Will refer to Podiatry also for uncontrolled DM with needing toenail and other routine foot care. Further diagnostic for persistent foot pain.   No orders of the defined types were placed in this encounter.  Orders Placed This Encounter  Procedures  . [facility] - Flu Vaccine QUAD 36+ mos PF IM (Fluarix & Fluzone Quad PF)  . Ambulatory referral to Podiatry    Referral Priority:   Routine    Referral Type:   Consultation    Referral Reason:    Specialty Services Required    Requested Specialty:   Podiatry    Number of Visits Requested:   1  . POCT HgB A1C      Follow up plan: Return in about 3 months (around 08/16/2019) for DM A1c w/ new provider.   Nobie Putnam, [facility] 05/18/2019, 11:30 AM

## 2019-05-18 NOTE — Patient Instructions (Addendum)
Thank you for coming to the office today.  Recent Labs    06/19/18 1905 01/13/19 1433 05/18/19 1202  HGBA1C 12.8* 10.0* 10.0*   For now, increase Lantus up to 16 units once daily. Continue metformin and bydureon - If fasting sugar in morning is consistently < 120 on average for a whole week, then you can reduce dose by 1 unit every week. - If fasting sugar is >150 consistently can increase by 1 unit at end of week if need.  Stay tuned for apt  [place] Address: [address] [place], [place] [address] Hours: Open 8AM-5PM Phone: [phone]  Please schedule a Follow-up Appointment to: Return in about 3 months (around 08/16/2019) for DM A1c w/ new provider.  If you have any other questions or concerns, please feel free to call the office or send a message through [facility]. You may also schedule an earlier appointment if necessary.  Additionally, you may be receiving a survey about your experience at our office within a few days to 1 week by e-mail or mail. We value your feedback.  Nobie Putnam, DO [place]

## 2019-05-21 ENCOUNTER — Other Ambulatory Visit: Payer: Self-pay

## 2019-05-21 ENCOUNTER — Ambulatory Visit (INDEPENDENT_AMBULATORY_CARE_PROVIDER_SITE_OTHER): Payer: Medicaid Other

## 2019-05-21 DIAGNOSIS — I5020 Unspecified systolic (congestive) heart failure: Secondary | ICD-10-CM

## 2019-05-25 ENCOUNTER — Other Ambulatory Visit: Payer: Medicaid Other

## 2019-05-27 ENCOUNTER — Telehealth: Payer: Self-pay

## 2019-05-27 NOTE — Telephone Encounter (Signed)
-----   Message from Theora Gianotti, NP sent at 05/22/2019  1:30 PM EST ----- Heart squeezing has improved and is now 45-50% (was 20-25%). This is great news and makes it really important that she continues to take her current medications as prescribed.

## 2019-05-27 NOTE — Telephone Encounter (Signed)
Call to patient to discuss echo results. Pt verbalized understanding and she had no further questions at this time.   confirmed upcoming appt with Dr. Saunders Revel.   Advised pt to call for any further questions or concerns.

## 2019-06-08 NOTE — Progress Notes (Signed)
. Cardiology Office Note  Date:  06/09/2019   ID:  Erin Hayes, DOB 1965/12/30, MRN [id number]  PCP:  Erin College, NP (Inactive)   Chief Complaint  Patient presents with  . office visit    Pt 3 month f/u pt c/o dizziness. Meds vebally reviewed.    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 Left ventricular ejection fraction, by visual estimation, is 45 to  50%. The left ventricle has normal function. There is mildly increased  left ventricular hypertrophy.  2. Left ventricular diastolic parameters are consistent with Grade I  diastolic dysfunction (impaired relaxation).  3. The left ventricle demonstrates hypokinesis of the anteroseptal wall,  basal anterior wall   EKG personally reviewed by myself on todays visit Shows NSR with ivcd rater 89 bpm , LAFB  Prior medical records reviewed  ED 02/04/17 due to pneumonia  Admitted 02/18/17 due to LLL pneumonia. Initially needed IV antibiotics & then transitioned to oral antibiotics. Discharged  Admitted 04/03/17 due to acute HF.   IV diuretics  echo and  catheterization by Dr.  Fletcher Anon showing nonischemic cardiomyopathy ejection fraction less than 25%   Recently she reports she has been doing well, denies any lower extremity edema or shortness of breath or abdominal bloating Weight has been stable, Carvedilol is on her list but she does not have this medication No significant change in her weight Feels almost  back to normal   cardiac catheterization November 2018 Ejection fraction 25% or less, mild nonobstructive coronary disease, left ventricular end-diastolic pressure 33    PMH:   has a past medical history of Carpal tunnel syndrome of right wrist, Chronic combined systolic (congestive) and diastolic (congestive) heart failure ([place]), Diabetes ([place]), GERD (gastroesophageal reflux disease), HLD (hyperlipidemia), Hypertension, NICM (nonischemic cardiomyopathy) ([place]), Pleural effusion, left, and Pneumonia (02/19/2017).  PSH:    Past Surgical History:  Procedure Laterality Date  . COLONOSCOPY WITH PROPOFOL N/A 01/21/2017   Procedure: COLONOSCOPY WITH PROPOFOL;  Surgeon: Erin Bellows, MD;  [facility] END[facility]COPY;  Service: Gastroenterology;  Laterality: N/A;  . ESOPHAGOGASTRODUODENOSCOPY (EGD) WITH PROPOFOL N/A 01/21/2017   Procedure: ESOPHAGOGASTRODUODENOSCOPY (EGD) WITH PROPOFOL;  Surgeon: Erin Bellows, MD;  Location[facility] ENDOSCOPY;  Service: Gastroenterology;  Laterality: N/A;  . FLEXIBLE SIGMOIDOSCOPY N/A 11/04/2016   Procedure: FLEXIBLE SIGMOIDOSCOPY;  Surgeon: Erin Corner, MD;  Location[facility] ENDOSCOPY;  Service: Endoscopy;  Laterality: N/A;  . LEFT HEART CATH AND CORONARY ANGIOGRAPHY N/A 04/05/2017   Procedure: LEFT HEART CATH AND CORONARY ANGIOGRAPHY;  Surgeon: Erin [place], MD;  Location: [place] CV LAB;  Service: Cardiovascular;  Laterality: N/A;  . LITHOTRIPSY      Current Outpatient Medications  Medication Sig Dispense Refill  . albuterol (PROVENTIL HFA;VENTOLIN HFA) 108 (90 Base) MCG/ACT inhaler Inhale 2 puffs into the lungs every 4 (four) hours as needed for wheezing or shortness of breath. 1 Inhaler 3  . aspirin 81 MG tablet Take 81 mg by mouth daily.    . carvedilol (COREG) 12.5 MG tablet Take 1 tablet (12.5 mg  total) by mouth 2 (two) times daily. 180 tablet 3  . Exenatide ER (BYDUREON) 2 MG PEN Inject 2 mg into the skin every 7 (seven) days. 4 each 5  . furosemide  (LASIX) 20 MG tablet Take 1 tablet (20 mg total) by mouth daily. 90 tablet 3  . glucose blood (CONTOUR TEST) test strip by Other route Two (2) times a day.    . Insulin Glargine (LANTUS) 100 UNIT/ML Solostar Pen Inject 14 Units into the skin at bedtime. 15 mL 3  . metFORMIN (GLUCOPHAGE) 500 MG tablet Take 1 tablet (500 mg total) by mouth daily with breakfast. 90 tablet 1  . rosuvastatin (CRESTOR) 20 MG tablet Take 1 tablet (20 mg total) by mouth daily. 90 tablet 3  . sacubitril-valsartan (ENTRESTO) 49-51 MG Take 1 tablet by mouth 2 (two) times daily. 180 tablet 0  . spironolactone (ALDACTONE) 25 MG tablet Take 1 tablet (25 mg total) by mouth daily. MUST MAKE APPOINTMENT FOR FURTHER REFILLS 30 tablet 0   No current facility-administered medications for this visit.     Allergies:   Patient has no known allergies.   Social History:  The patient  reports that she has never smoked. She has never used smokeless tobacco. She reports that she does not drink alcohol or use drugs.   Family History:   family history includes CAD in her father; Heart disease in her father; Hypertension in her father; Lung cancer in her mother.    Review of Systems: Review of Systems  Constitutional: Negative.   Respiratory: Negative.   Cardiovascular: Negative.   Gastrointestinal: Negative.   Musculoskeletal: Negative.   Neurological: Negative.   Psychiatric/Behavioral: Negative.   All other systems reviewed and are negative.    PHYSICAL EXAM: VS:  BP 110/78 (BP Location: Right Arm, Patient Position: Sitting, Cuff Size: Normal)   Pulse 89   Ht 4\' 11"  (1.499 m)   Wt 116 lb 4 oz (52.7 kg)   SpO2 97%   BMI 23.48 kg/m  , BMI Body mass index is 23.48 kg/m. GEN: Well nourished, well developed, in no acute distress  HEENT: normal  Neck: no JVD, carotid bruits, or masses Cardiac: RRR; no murmurs, rubs, or gallops,no edema  Respiratory:  clear to auscultation bilaterally, normal work of breathing GI: soft,  nontender, nondistended, + BS MS: no deformity or atrophy  Skin: warm and dry, no rash Neuro:  Strength and sensation are intact Psych: euthymic mood, full affect    Recent Labs: 06/19/2018: ALT 21; Hemoglobin 11.6; Platelets 435 04/08/2019: BUN 14; Creatinine, Ser 0.80; Potassium 4.7; Sodium 138    Lipid Panel Lab Results  Component Value Date   CHOL 216 (H) 06/19/2018   HDL 53 06/19/2018   LDLCALC 131 (H) 06/19/2018   TRIG 158 (H) 06/19/2018      Wt Readings from Last 3 Encounters:  06/09/19 116 lb 4 oz (52.7 kg)  05/18/19 117 lb 3.2 oz (53.2 kg)  04/19/19 123 lb 14.4 oz (56.2 kg)       ASSESSMENT AND PLAN:  Chronic systolic congestive heart failure (HCC) - Plan: EKG stable, euvolemic,  No medication changes  Type 2 diabetes mellitus without complication, without long-term current use of insulin (HCC)  hemoglobin A1c 10 Working with primary, Does not think it is from food  NICM (nonischemic cardiomyopathy) ([place]) EF 45 to 50% Feeling well, Stay on entresto  Essential hypertension Blood pressure is well controlled on today's visit. No changes made to the medications.  Disposition:  F/U  12 months   Total encounter time more than 25 minutes  Greater than 50% was spent in counseling and coordination of care with the patient    No orders of the defined types were placed in this encounter.    Signed, Esmond Plants, M.D., Ph.D. 06/09/2019  [place], [place]

## 2019-06-09 ENCOUNTER — Other Ambulatory Visit: Payer: Self-pay

## 2019-06-09 ENCOUNTER — Encounter: Payer: Self-pay | Admitting: Cardiovascular Disease

## 2019-06-09 ENCOUNTER — Ambulatory Visit (INDEPENDENT_AMBULATORY_CARE_PROVIDER_SITE_OTHER): Payer: Medicaid Other | Admitting: Cardiovascular Disease

## 2019-06-09 VITALS — BP 110/78 | HR 89 | Ht 59.0 in | Wt 116.2 lb

## 2019-06-09 DIAGNOSIS — I5022 Chronic systolic (congestive) heart failure: Secondary | ICD-10-CM

## 2019-06-09 DIAGNOSIS — I10 Essential (primary) hypertension: Secondary | ICD-10-CM

## 2019-06-09 DIAGNOSIS — E119 Type 2 diabetes mellitus without complications: Secondary | ICD-10-CM

## 2019-06-09 DIAGNOSIS — E782 Mixed hyperlipidemia: Secondary | ICD-10-CM | POA: Diagnosis not present

## 2019-06-09 DIAGNOSIS — I428 Other cardiomyopathies: Secondary | ICD-10-CM | POA: Diagnosis not present

## 2019-06-09 DIAGNOSIS — Z794 Long term (current) use of insulin: Secondary | ICD-10-CM

## 2019-06-09 NOTE — Patient Instructions (Addendum)
Medication Instructions:  No changes  If you need a refill on your cardiac medications before your next appointment, please call your pharmacy.    Lab work: No new labs needed   If you have labs (blood work) drawn today and your tests are completely normal, you will receive your results only by: [name] MyChart Message (if you have MyChart) OR . A paper copy in the mail If you have any lab test that is abnormal or we need to change your treatment, we will call you to review the results.   Testing/Procedures: No new testing needed   Follow-Up: At [facility], you and your health needs are our priority.  As part of our continuing mission to provide you with exceptional heart care, we have created designated Provider Care Teams.  These Care Teams include your primary Cardiologist (physician) and Advanced Practice Providers (APPs -  Physician Assistants and Nurse Practitioners) who all work together to provide you with the care you need, when you need it.  . You will need a follow up appointment in 12 months   . Providers on your designated Care Team:   . Murray Hodgkins, NP . Christell Faith, PA-C . Marrianne Mood, PA-C  Any Other Special Instructions Will Be Listed Below (If Applicable).  For educational health videos Log in to : www.myemmi.com Or : [email or web address], password : [email or web address]   COVID-19 Vaccine Information can be found at: ShippingScam.co.uk For questions related to vaccine distribution or appointments, please email vaccine@[email or web address] or call [phone].

## 2019-07-01 ENCOUNTER — Telehealth: Payer: Self-pay | Admitting: Podiatry

## 2019-07-01 NOTE — Telephone Encounter (Signed)
Left several messages to schedule appt for patient.Has history of NO Show

## 2019-07-02 ENCOUNTER — Other Ambulatory Visit: Payer: Self-pay

## 2019-07-02 DIAGNOSIS — E1165 Type 2 diabetes mellitus with hyperglycemia: Secondary | ICD-10-CM

## 2019-07-02 MED ORDER — INSULIN PEN NEEDLE 31G X 5 MM MISC: 0 refills | Status: DC

## 2019-07-13 ENCOUNTER — Other Ambulatory Visit: Payer: Self-pay | Admitting: Cardiovascular Disease

## 2019-07-13 DIAGNOSIS — I5022 Chronic systolic (congestive) heart failure: Secondary | ICD-10-CM

## 2019-07-13 MED ORDER — SPIRONOLACTONE 25 MG PO TABS: 25.0000 mg | ORAL_TABLET | Freq: Every day | ORAL | 2 refills | Status: DC

## 2019-07-13 NOTE — Telephone Encounter (Signed)
*  STAT* If patient is at the pharmacy, call can be transferred to refill team.   1. Which medications need to be refilled? (please list name of each medication and dose if known)  Spironolactone 25 mg  2. Which pharmacy/location (including street and city if local pharmacy) is medication to be sent to? [facility]  3. Do they need a 30 day or 90 day supply? 90    Patient was seen in February with [facility]

## 2019-07-13 NOTE — Telephone Encounter (Signed)
Requested Prescriptions   Signed Prescriptions Disp Refills   spironolactone (ALDACTONE) 25 MG tablet 90 tablet 2    Sig: Take 1 tablet (25 mg total) by mouth daily. MUST MAKE APPOINTMENT FOR FURTHER REFILLS    Authorizing Provider: Minna Merritts    Ordering User: Raelene Bott, Axel Frisk L

## 2019-07-28 ENCOUNTER — Ambulatory Visit: Payer: MEDICAID | Admitting: Podiatry

## 2019-08-11 ENCOUNTER — Ambulatory Visit: Payer: MEDICAID | Admitting: Podiatry

## 2019-08-11 ENCOUNTER — Ambulatory Visit: Payer: MEDICAID

## 2019-08-11 ENCOUNTER — Other Ambulatory Visit: Payer: Self-pay

## 2019-08-11 ENCOUNTER — Encounter: Payer: Self-pay | Admitting: Podiatry

## 2019-08-11 VITALS — Temp 96.7°F

## 2019-08-11 DIAGNOSIS — M778 Other enthesopathies, not elsewhere classified: Secondary | ICD-10-CM

## 2019-08-11 DIAGNOSIS — S92302A Fracture of unspecified metatarsal bone(s), left foot, initial encounter for closed fracture: Secondary | ICD-10-CM

## 2019-08-11 DIAGNOSIS — M779 Enthesopathy, unspecified: Secondary | ICD-10-CM

## 2019-08-11 MED ORDER — MELOXICAM 15 MG PO TABS: 15.0000 mg | ORAL_TABLET | Freq: Every day | ORAL | 1 refills | Status: DC

## 2019-08-11 MED ORDER — FUROSEMIDE 20 MG PO TABS: 20.0000 mg | ORAL_TABLET | Freq: Every day | ORAL | 3 refills | Status: DC

## 2019-08-14 NOTE — Progress Notes (Signed)
   HPI: 54 y.o. female presenting today as a new patient with a chief complaint of sharp, stabbing pain to the left foot that began about 6 months ago secondary to an injury. She states she fell in October 2020 which caused the pain to begin. She also reports sudden onset right foot pain that began 2-3 weeks ago. She reports associated swelling of the right foot. Walking increases the pain in the feet. She has been using heat therapy for treatment. Patient is here for further evaluation and treatment.   Past Medical History:  Diagnosis Date  . Carpal tunnel syndrome of right wrist   . Chronic combined systolic (congestive) and diastolic (congestive) heart failure ([place])    a. 03/2017 Echo: EF 20-25%, diff HK, Gr2 DD; b. 09/2017 Echo: EF 20-25%, diff HK, Gr2 DD.  . Diabetes ([place])   . GERD (gastroesophageal reflux disease)   . HLD (hyperlipidemia)   . Hypertension   . NICM (nonischemic cardiomyopathy) ([place])    a. 03/2017 Echo: EF 20-25%, diff HK, Gr2 DD, mild to mod MR, nl RV fxn;  b. 03/2017 Cath: mild nonobs dzs, EF 25%; c. 09/2017 Echo: EF 20-25%, diff HK. Gr2 DD. Mild MR. Mildly reduced RV fxn.  . Pleural effusion, left    a. 03/2017 s/p thoracentesis-->300 ml withdrawn--transudative.  . Pneumonia 02/19/2017     Physical Exam: General: The patient is alert and oriented x3 in no acute distress.  Dermatology: Skin is warm, dry and supple bilateral lower extremities. Negative for open lesions or macerations.  Vascular: Palpable pedal pulses bilaterally. No edema or erythema noted. Capillary refill within normal limits.  Neurological: Epicritic and protective threshold grossly intact bilaterally.   Musculoskeletal Exam: Pain with palpation noted to the fourth metatarsal head of the left foot. Pain with palpatin noted to the right midfoot. Range of motion within normal limits to all pedal and ankle joints bilateral. Muscle strength 5/5 in all groups bilateral.   Radiographic Exam:  Fracture  of the left fourth metatarsal head noted with minimal displacement.     Assessment: 1. Fracture left fourth metatarsal head with minimal displacement  2. Right foot capsulitis  3. H/o fall injury left foot - October 2020   Plan of Care:  1. Patient evaluated. X-Rays reviewed.  2. Post op shoe dispensed for left foot.  3. Compression anklet dispensed for right foot.  4. Prescription for Meloxicam provided to patient. 5. Return to clinic in 6 weeks.      Edrick Kins, DPM [facility]  Dr. Edrick Kins, DPM    [address], [place] [address]                Office [phone]  Fax [phone]

## 2019-08-20 ENCOUNTER — Encounter: Payer: Self-pay | Admitting: Family Medicine

## 2019-08-20 ENCOUNTER — Other Ambulatory Visit: Payer: Self-pay | Admitting: Podiatry

## 2019-08-20 ENCOUNTER — Other Ambulatory Visit: Payer: Self-pay

## 2019-08-20 ENCOUNTER — Ambulatory Visit (INDEPENDENT_AMBULATORY_CARE_PROVIDER_SITE_OTHER): Payer: Medicaid Other | Admitting: Family Medicine

## 2019-08-20 ENCOUNTER — Telehealth: Payer: Self-pay | Admitting: Cardiovascular Disease

## 2019-08-20 VITALS — BP 127/87 | HR 78 | Temp 97.1°F | Ht 59.0 in | Wt 120.6 lb

## 2019-08-20 DIAGNOSIS — I10 Essential (primary) hypertension: Secondary | ICD-10-CM

## 2019-08-20 DIAGNOSIS — E119 Type 2 diabetes mellitus without complications: Secondary | ICD-10-CM

## 2019-08-20 DIAGNOSIS — E876 Hypokalemia: Secondary | ICD-10-CM | POA: Diagnosis not present

## 2019-08-20 DIAGNOSIS — M19079 Primary osteoarthritis, unspecified ankle and foot: Secondary | ICD-10-CM

## 2019-08-20 DIAGNOSIS — Z1231 Encounter for screening mammogram for malignant neoplasm of breast: Secondary | ICD-10-CM

## 2019-08-20 DIAGNOSIS — E7800 Pure hypercholesterolemia, unspecified: Secondary | ICD-10-CM | POA: Diagnosis not present

## 2019-08-20 DIAGNOSIS — R8290 Unspecified abnormal findings in urine: Secondary | ICD-10-CM

## 2019-08-20 DIAGNOSIS — E1165 Type 2 diabetes mellitus with hyperglycemia: Secondary | ICD-10-CM

## 2019-08-20 DIAGNOSIS — I5022 Chronic systolic (congestive) heart failure: Secondary | ICD-10-CM

## 2019-08-20 DIAGNOSIS — M778 Other enthesopathies, not elsewhere classified: Secondary | ICD-10-CM

## 2019-08-20 DIAGNOSIS — Z13820 Encounter for screening for osteoporosis: Secondary | ICD-10-CM | POA: Insufficient documentation

## 2019-08-20 DIAGNOSIS — R635 Abnormal weight gain: Secondary | ICD-10-CM

## 2019-08-20 DIAGNOSIS — S92302A Fracture of unspecified metatarsal bone(s), left foot, initial encounter for closed fracture: Secondary | ICD-10-CM

## 2019-08-20 DIAGNOSIS — Z1239 Encounter for other screening for malignant neoplasm of breast: Secondary | ICD-10-CM | POA: Insufficient documentation

## 2019-08-20 DIAGNOSIS — R197 Diarrhea, unspecified: Secondary | ICD-10-CM

## 2019-08-20 LAB — POCT URINALYSIS DIPSTICK
Glucose, UA: NEGATIVE
Nitrite, UA: POSITIVE
Protein, UA: POSITIVE — AB
Spec Grav, UA: >=1.03 — AB (ref 1.010–1.025)
Urobilinogen, UA: 0.2 E.U./dL
pH, UA: 5 (ref 5.0–8.0)

## 2019-08-20 LAB — POCT GLYCOSYLATED HEMOGLOBIN (HGB A1C): Hemoglobin A1C: 9.8 % — AB (ref 4.0–5.6)

## 2019-08-20 MED ORDER — FUROSEMIDE 20 MG PO TABS: 20.0000 mg | ORAL_TABLET | Freq: Every day | ORAL | 3 refills | Status: DC

## 2019-08-20 MED ORDER — CARVEDILOL 12.5 MG PO TABS: 12.5000 mg | ORAL_TABLET | Freq: Two times a day (BID) | ORAL | 3 refills | Status: DC

## 2019-08-20 MED ORDER — ALBUTEROL SULFATE HFA 108 (90 BASE) MCG/ACT IN AERS: 2.0000 | INHALATION_SPRAY | RESPIRATORY_TRACT | 1 refills | Status: DC | PRN

## 2019-08-20 MED ORDER — MELOXICAM 15 MG PO TABS: 15.0000 mg | ORAL_TABLET | Freq: Every day | ORAL | 1 refills | Status: DC

## 2019-08-20 MED ORDER — SACUBITRIL-VALSARTAN 49-51 MG PO TABS: 1.0000 | ORAL_TABLET | Freq: Two times a day (BID) | ORAL | 0 refills | Status: DC

## 2019-08-20 MED ORDER — SPIRONOLACTONE 25 MG PO TABS: 25.0000 mg | ORAL_TABLET | Freq: Every day | ORAL | 2 refills | Status: DC

## 2019-08-20 MED ORDER — METFORMIN HCL 500 MG PO TABS: 500.0000 mg | ORAL_TABLET | Freq: Every day | ORAL | 1 refills | Status: DC

## 2019-08-20 MED ORDER — METFORMIN HCL 500 MG PO TABS: 500.0000 mg | ORAL_TABLET | Freq: Two times a day (BID) | ORAL | 1 refills | Status: DC

## 2019-08-20 MED ORDER — INSULIN GLARGINE 100 UNIT/ML SOLOSTAR PEN: 14.0000 [IU] | PEN_INJECTOR | Freq: Every day | SUBCUTANEOUS | 3 refills | Status: DC

## 2019-08-20 MED ORDER — BYDUREON 2 MG ~~LOC~~ PEN: 2.0000 mg | PEN_INJECTOR | SUBCUTANEOUS | 5 refills | Status: DC

## 2019-08-20 MED ORDER — ROSUVASTATIN CALCIUM 20 MG PO TABS: 20.0000 mg | ORAL_TABLET | Freq: Every day | ORAL | 3 refills | Status: DC

## 2019-08-20 NOTE — Assessment & Plan Note (Addendum)
Stable and controlled hypertension.  BP is at goal < 130/80.  Pt is working on lifestyle modifications.  Taking medications tolerating well without side effects.   Plan: 1. Continue taking sacubitril-valsartan 49-51 mg twice daily, spironolactone 25mg  once daily,and carvedilol 12.5mg  twice daily 2. Obtain labs ordered today in the next 1-2 weeks  3. Encouraged heart healthy diet and increasing exercise to 30 minutes most days of the week, going no more than 2 days in a row without exercise. 4. Check BP 1-2 x per week at home, keep log, and bring to clinic at next appointment. 5. Follow up 3 months.

## 2019-08-20 NOTE — Progress Notes (Signed)
Subjective:    Patient ID: Erin Hayes, female    DOB: 07-29-1965, 54 y.o.   MRN: [id number]  Erin Hayes is a 54 y.o. female presenting on 08/20/2019 for Diabetes (A1c)   HPI  Diabetes Pt presents today for follow up Type 2 Diabetes Mellitus.  He/she (caps): She is not checking AM CBG at home. -Current diabetic medications include: lantus 14 units at bedtime, metformin 500mg  x 1 daily, and bydureon 2mg  weekly. -ACTION; IS/IS NOT: is not currently symptomatic -Actions; denies/reports/admits to: denies polydipsia, polyphagia, polyuria, headaches, diaphoresis, shakiness, chills, pain, numbness or tingling in extremities or changes in vision -Clinical course has been improving although remains higher than goal -Reports no exercise routine -Diet is moderate in salt, moderate in fat, and moderate in carbohydrates  PREVENTION Eye exam current (within 1 year) Up to date Foot exam current (within 1 year) Up to date Lipid/ASCVD risk reduction - on statin: YES/NO: Yes  Kidney Protection (On ACE/ARB)? YES/NO: Yes    Depression screen [facility] 2/9 05/18/2019 03/04/2018 09/03/2017  Decreased Interest 0 0 0  Down, Depressed, Hopeless 0 0 0  PHQ - 2 Score 0 0 0    Social History   Tobacco Use  . Smoking status: Never Smoker  . Smokeless tobacco: Never Used  Substance Use Topics  . Alcohol use: No  . Drug use: No    Review of Systems  Constitutional: Negative.   HENT: Negative.   Eyes: Negative.   Respiratory: Negative.   Cardiovascular: Negative.   Gastrointestinal: Negative.   Endocrine: Negative.   Genitourinary: Negative.   Musculoskeletal: Negative.   Skin: Negative.   Allergic/Immunologic: Negative.   Neurological: Negative.   Hematological: Negative.   Psychiatric/Behavioral: Negative.    Per HPI unless specifically indicated above     Objective:    BP 127/87   Pulse 78   Temp (!) 97.1 F (36.2 C) (Temporal)   Ht 4\' 11"  (1.499 m)   Wt 120 lb 9.6 oz (54.7 kg)    BMI 24.36 kg/m   Wt Readings from Last 3 Encounters:  08/20/19 120 lb 9.6 oz (54.7 kg)  06/09/19 116 lb 4 oz (52.7 kg)  05/18/19 117 lb 3.2 oz (53.2 kg)    Physical Exam Vitals reviewed.  Constitutional:      General: She is not in acute distress.    Appearance: Normal appearance. She is well-developed, well-groomed and normal weight. She is not ill-appearing or toxic-appearing.  HENT:     Head: Normocephalic.     Mouth/Throat:     Lips: Pink.     Pharynx: Uvula midline.  Eyes:     General: Lids are normal. Vision grossly intact. No scleral icterus.       Right eye: No discharge.        Left eye: No discharge.     Extraocular Movements: Extraocular movements intact.     Conjunctiva/sclera: Conjunctivae normal.     Pupils: Pupils are equal, round, and reactive to light.  Neck:     Thyroid: No thyroid mass or thyromegaly.  Cardiovascular:     Rate and Rhythm: Normal rate and regular rhythm.     Pulses: Normal pulses.          Dorsalis pedis pulses are 2+ on the right side and 2+ on the left side.       Posterior tibial pulses are 2+ on the right side and 2+ on the left side.     Heart sounds: Normal  heart sounds. No murmur. No friction rub. No gallop.   Pulmonary:     Effort: Pulmonary effort is normal. No respiratory distress.     Breath sounds: Normal breath sounds.  Abdominal:     General: Abdomen is flat. Bowel sounds are normal. There is no distension.     Palpations: Abdomen is soft. There is no hepatomegaly, splenomegaly or mass.     Tenderness: There is no abdominal tenderness. There is no guarding or rebound.     Hernia: No hernia is present.  Musculoskeletal:        General: Normal range of motion.     Cervical back: Normal range of motion and neck supple. No tenderness.     Right lower leg: No edema.     Left lower leg: No edema.  Feet:     Right foot:     Skin integrity: Skin integrity normal.     Left foot:     Skin integrity: Skin integrity normal.      Comments: Absent of callus and all other lesions.  Some dry skin noted, but pt reports regular foot care and can see bottom of her feet.  Lymphadenopathy:     Cervical: No cervical adenopathy.  Skin:    General: Skin is warm and dry.     Capillary Refill: Capillary refill takes less than 2 seconds.  Neurological:     General: No focal deficit present.     Mental Status: She is alert and oriented to person, place, and time.     Cranial Nerves: No cranial nerve deficit.     Sensory: No sensory deficit.     Motor: No weakness.     Coordination: Coordination normal.     Gait: Gait normal.     Deep Tendon Reflexes: Reflexes normal.  Psychiatric:        Attention and Perception: Attention and perception normal.        Mood and Affect: Mood and affect normal.        Speech: Speech normal.        Behavior: Behavior normal. Behavior is cooperative.        Thought Content: Thought content normal.        Cognition and Memory: Cognition and memory normal.        Judgment: Judgment normal.     Results for orders placed or performed in visit on 08/20/19  POCT HgB A1C  Result Value Ref Range   Hemoglobin A1C 9.8 (A) 4.0 - 5.6 %   HbA1c POC (<> result, manual entry)     HbA1c, POC (prediabetic range)     HbA1c, POC (controlled diabetic range)    POCT Urinalysis Dipstick  Result Value Ref Range   Color, UA Dark Yellow    Clarity, UA     Glucose, UA Negative Negative   Bilirubin, UA Small (+)    Ketones, UA Trace    Spec Grav, UA >=1.030 (A) 1.010 - 1.025   Blood, UA Small (+)    pH, UA 5.0 5.0 - 8.0   Protein, UA Positive (A) Negative   Urobilinogen, UA 0.2 0.2 or 1.0 E.U./dL   Nitrite, UA Positive    Leukocytes, UA Large (3+) (A) Negative   Appearance Cloudy    Odor        Assessment & Plan:   Problem List Items Addressed This Visit      Cardiovascular and Mediastinum   Hypertension    Stable and controlled hypertension.  BP is at goal < 130/80.  Pt is working on lifestyle  modifications.  Taking medications tolerating well without side effects.   Plan: 1. Continue taking sacubitril-valsartan 49-51 mg twice daily, spironolactone 25mg  once daily,and carvedilol 12.5mg  twice daily 2. Obtain labs ordered today in the next 1-2 weeks  3. Encouraged heart healthy diet and increasing exercise to 30 minutes most days of the week, going no more than 2 days in a row without exercise. 4. Check BP 1-2 x per week at home, keep log, and bring to clinic at next appointment. 5. Follow up 3 months.       Relevant Medications   spironolactone (ALDACTONE) 25 MG tablet   sacubitril-valsartan (ENTRESTO) 49-51 MG   furosemide (LASIX) 20 MG tablet   carvedilol (COREG) 12.5 MG tablet   rosuvastatin (CRESTOR) 20 MG tablet   Other Relevant Orders   CBC with Differential   COMPLETE METABOLIC PANEL WITH GFR   CHF (congestive heart failure) (HCC)   Relevant Medications   spironolactone (ALDACTONE) 25 MG tablet   sacubitril-valsartan (ENTRESTO) 49-51 MG   furosemide (LASIX) 20 MG tablet   carvedilol (COREG) 12.5 MG tablet   albuterol (VENTOLIN HFA) 108 (90 Base) MCG/ACT inhaler   rosuvastatin (CRESTOR) 20 MG tablet     Endocrine   Uncontrolled type 2 diabetes mellitus with hyperglycemia ([place]) - Primary    UncontrolledDM with A1c 9.8% improved from 10.0% on 05/18/2019 and goal A1c < 7.0%.  Has been taking her medications and tolerating well without side effects.  Plan:  1. Continue current therapy: budyreon 2mg  weekly, lantus 14 units nightly and INCREASE metformin to 500 mg TWICE DAILY 2. Encourage improved lifestyle: - low carb/low glycemic diet reinforced prior education - Increase physical activity to 30 minutes most days of the week.  Explained that increased physical activity increases body's use of sugar for energy. 3. Check fasting am CBG and log these.  Bring log to next visit for review 4. Continue ARB and Statin 5. Advised to schedule DM ophtho exam, send record. 6.  Follow-up 3 months      Relevant Medications   Exenatide ER (BYDUREON) 2 MG PEN   insulin glargine (LANTUS) 100 UNIT/ML Solostar Pen   rosuvastatin (CRESTOR) 20 MG tablet   metFORMIN (GLUCOPHAGE) 500 MG tablet   Other Relevant Orders   POCT HgB A1C (Completed)   POCT Urinalysis Dipstick (Completed)   CBC with Differential   COMPLETE METABOLIC PANEL WITH GFR     Other   Hypokalemia   Relevant Orders   COMPLETE METABOLIC PANEL WITH GFR   Hypercholesterolemia    Status unknown.  Recheck labs.  Continue meds without changes today.  Refills provided. Followup after labs.       Relevant Medications   spironolactone (ALDACTONE) 25 MG tablet   sacubitril-valsartan (ENTRESTO) 49-51 MG   furosemide (LASIX) 20 MG tablet   carvedilol (COREG) 12.5 MG tablet   rosuvastatin (CRESTOR) 20 MG tablet   Other Relevant Orders   Lipid Profile   Screening for malignant neoplasm of breast    Pt last mammogram not on file, reports years ago.   Plan: 1. Screening mammogram order placed.  Pt will call to schedule appointment.  Information given.       Relevant Orders   MM 3D SCREEN BREAST BILATERAL   Screening for osteoporosis    Pt postmenopausal w/out history of prior DEXA scan.    Plan: 1. Obtain DG bone density.  Relevant Orders   DG Bone Density    Other Visit Diagnoses    Weight gain       Relevant Orders   Thyroid Panel With TSH   Diabetes mellitus without complication (HCC)       Relevant Medications   Exenatide ER (BYDUREON) 2 MG PEN   insulin glargine (LANTUS) 100 UNIT/ML Solostar Pen   rosuvastatin (CRESTOR) 20 MG tablet   metFORMIN (GLUCOPHAGE) 500 MG tablet   Inflammation of foot joint       Relevant Medications   meloxicam (MOBIC) 15 MG tablet   Diarrhea, unspecified type       Relevant Orders   Ambulatory referral to Gastroenterology   Abnormal urinalysis       Relevant Orders   Urine Culture      Meds ordered this encounter  Medications  .  spironolactone (ALDACTONE) 25 MG tablet    Sig: Take 1 tablet (25 mg total) by mouth daily. MUST MAKE APPOINTMENT FOR FURTHER REFILLS    Dispense:  90 tablet    Refill:  2  . Exenatide ER (BYDUREON) 2 MG PEN    Sig: Inject 2 mg into the skin every 7 (seven) days.    Dispense:  4 each    Refill:  5  . sacubitril-valsartan (ENTRESTO) 49-51 MG    Sig: Take 1 tablet by mouth 2 (two) times daily.    Dispense:  180 tablet    Refill:  0  . furosemide (LASIX) 20 MG tablet    Sig: Take 1 tablet (20 mg total) by mouth daily.    Dispense:  90 tablet    Refill:  3  . carvedilol (COREG) 12.5 MG tablet    Sig: Take 1 tablet (12.5 mg total) by mouth 2 (two) times daily.    Dispense:  180 tablet    Refill:  3    D/C carvedilol 6.25mg   . albuterol (VENTOLIN HFA) 108 (90 Base) MCG/ACT inhaler    Sig: Inhale 2 puffs into the lungs every 4 (four) hours as needed for wheezing or shortness of breath.    Dispense:  6.7 g    Refill:  1  . insulin glargine (LANTUS) 100 UNIT/ML Solostar Pen    Sig: Inject 14 Units into the skin at bedtime.    Dispense:  15 mL    Refill:  3  . meloxicam (MOBIC) 15 MG tablet    Sig: Take 1 tablet (15 mg total) by mouth daily.    Dispense:  30 tablet    Refill:  1  . DISCONTD: metFORMIN (GLUCOPHAGE) 500 MG tablet    Sig: Take 1 tablet (500 mg total) by mouth daily with breakfast.    Dispense:  90 tablet    Refill:  1  . rosuvastatin (CRESTOR) 20 MG tablet    Sig: Take 1 tablet (20 mg total) by mouth daily.    Dispense:  90 tablet    Refill:  3  . metFORMIN (GLUCOPHAGE) 500 MG tablet    Sig: Take 1 tablet (500 mg total) by mouth 2 (two) times daily with a meal.    Dispense:  180 tablet    Refill:  1      Follow up plan: Return in about 3 months (around 11/19/2019) for A1C DM, HTN, HLD F/U, PAP testing.   Harlin Rain, [place] Family Nurse Practitioner [facility] 08/20/2019, 10:44 AM

## 2019-08-20 NOTE — Telephone Encounter (Signed)
*  STAT* If patient is at the pharmacy, call can be transferred to refill team.   1. Which medications need to be refilled? (please list name of each medication and dose if known)  Entresto 49-51 mg bid  2. Which pharmacy/location (including street and city if local pharmacy) is medication to be sent to? [facility] in [place]  3. Do they need a 30 day or 90 day supply? [place]

## 2019-08-20 NOTE — Assessment & Plan Note (Signed)
Status unknown.  Recheck labs.  Continue meds without changes today.  Refills provided. Followup after labs.  

## 2019-08-20 NOTE — Assessment & Plan Note (Signed)
Pt postmenopausal w/out history of prior DEXA scan.    Plan: 1. Obtain DG bone density.

## 2019-08-20 NOTE — Patient Instructions (Addendum)
As we discussed, have your labs drawn in the next 1-2 weeks and we will contact you with the results.  Your medication refills have been sent to your pharmacy on file.  We are increasing your Metformin from 500mg  once per day to 500mg  twice per day.  For Mammogram screening for breast cancer and DEXA Scan (Bone mineral density) screening for osteoporosis  Call the [place] below anytime to schedule your own appointment now that order has been placed.  [facility] [address], [place] [address] Phone: [phone]  [facility] [address] [place], [place] [address] Phone: [phone]  Advice on Protecting Your Feet   1. Daily foot inspections - Look for any breaks in the skin or areas of irritation such as blisters or red areas. Report to primary doctor or foot nurse immediately if any problems occur.  - If you have vision problems and cannot see your feet well or it is hard for you to reach your feet, ask a family member to inspect your feet daily.   2. Daily foot hygiene  - Wash feet daily, but do not soak your feet in hot water. If you do have callus, you may use warm water epsom salt foot soak and use moisturizer after. - Dry well especially between toes; Pat dry do not rub.  - If your skin is dry use a lotion to moisturize but never between the toes.  - If your skin is wet from perspiration, use an antifungal foot powder daily.   3. Shoes and Socks  - Wear a clean pair of socks daily.  - Make sure your shoes fit well and that you are measured and properly fit each time you purchase shoes. Your shoe size may change.  - Powder your shoes with a small amount of an antifungal foot powder daily, as the shoe is the only article of clothing that is not laundered.  - Wear appropriate shoes and socks for the weather. It is especially important to protect your feet from the cold; however, don't  forget about sunscreen to the tops of your feet in the hot sun.  - Wear well fitting shoes rather than slippers or flip-flops when walking or standing for long period of time.  - When at home make sure you always have protective foot wear on your feet.    Eat at least 3 meals and 1-2 snacks per day (don't skip breakfast).  Aim for no more than 5 hours between eating. - Tip: If you go >5 hours without eating and become very hungry, your body will supply it's own resources temporarily and you can gain extra weight when you eat.  The 5 Minute Rule of Exercise - Promise yourself to at least do 5 minutes of exercise (make sure you time it), and if at the end of 5 minutes (this is the hardest part of the work-out), if you still feel like you want stop (or not motivated to continue) then allow yourself to stop. Otherwise, more often than not you will feel encouraged that you can continue for a little while longer or even more!  My 5 to Fitness!  5: fruits and vegetables per day (work on 9 per day if you are at 5) 4: exercise 4-5 times per week for at least 30 minutes (walking counts!) 3: meals per day (don't skip breakfast!), no more than 5 hours between meals 2: habits to quit -smoking -excess  alcohol use (men >2 beer/day; women >1beer/day) 1: sweet per day (2 cookies, 1 small cup of ice cream, 12 oz soda)  These are general tips for healthy living. Try to start with 1 or 2 habit TODAY and make it a part of your life for several months. You set a goal today to work on:  Once you have 1 or 2 habits down for several months, try to begin working on your next healthy habit. With every single step you take, you will be leading a healthier lifestyle!   Diet Recommendations for Diabetes   Starchy (carb) foods include: Bread, rice, pasta, potatoes, corn, crackers, bagels, muffins, all baked goods.   Protein foods include: Meat, fish, poultry, eggs, dairy foods, and beans such as pinto and kidney  beans (beans also provide carbohydrate).   1. Eat at least 3 meals and 1-2 snacks per day. Never go more than 4-5 hours while awake without eating.   2. Limit starchy foods to TWO per meal and ONE per snack. ONE portion of a starchy  food is equal to the following:   - ONE slice of bread (or its equivalent, such as half of a hamburger bun).   - 1/2 cup of a "scoopable" starchy food such as potatoes or rice.   - 1 OUNCE (28 grams) of starchy snacks (crackers or pretzels, look on label).   - 15 grams of carbohydrate as shown on food label.   3. Both lunch and dinner should include a protein food, a carb food, and vegetables.   - Obtain twice as many veg's as protein or carbohydrate foods for both lunch and dinner.   - Try to keep frozen veg's on hand for a quick vegetable serving.     - Fresh or frozen veg's are best.   4. Breakfast should always include protein.    You can learn more information online about your diabetes at American Diabetes Association: http://www.diabetes.org/ - General self-care (diet, medications, blood sugar checks). - Diet recommendations - There are even recipes available for you to look at and try.  Try to get exercise a minimum of 30 minutes per day at least 5 days per week as well as  adequate water intake all while measuring blood pressure a few times per week.  Keep a blood pressure log and bring back to clinic at your next visit.  If your readings are consistently over 140/90 to contact our office/send me a MyChart message and we will see you sooner.  Can try DASH and Mediterranean diet options, avoiding processed foods, lowering sodium intake, avoiding pork products, and eating a plant based diet for optimal health.  We will plan to see you back in 3 months for follow up on your diabetes, hypertension, hyperlipidemia and PAP testing.  You will receive a survey after today's visit either digitally by e-mail or paper by [employer]. Your experiences and feedback  matter to [place].  Please respond so we know how we are doing as we provide care for you.  Call us with any questions/concerns/needs.  It is my goal to be available to you for your health concerns.  Thanks for choosing me to be a partner in your healthcare needs!  Harlin Rain, FNP-C Family Nurse Practitioner [facility] Phone: [phone]

## 2019-08-20 NOTE — Assessment & Plan Note (Signed)
Pt last mammogram not on file, reports years ago.   Plan: 1. Screening mammogram order placed.  Pt will call to schedule appointment.  Information given.

## 2019-08-20 NOTE — Telephone Encounter (Signed)
sacubitril-valsartan (ENTRESTO) 49-51 MG 180 tablet 0 08/20/2019    Sig - Route: Take 1 tablet by mouth 2 (two) times daily. - Oral   Sent to pharmacy as: sacubitril-valsartan (ENTRESTO) 49-51 MG   E-Prescribing Status: Transmission to pharmacy in progress (08/20/2019  9:39 AM EDT)   Pharmacy  [facility] [place] [facility] [place]

## 2019-08-20 NOTE — Assessment & Plan Note (Signed)
UncontrolledDM with A1c 9.8% improved from 10.0% on 05/18/2019 and goal A1c < 7.0%.  Has been taking her medications and tolerating well without side effects.  Plan:  1. Continue current therapy: budyreon 2mg  weekly, lantus 14 units nightly and INCREASE metformin to 500 mg TWICE DAILY 2. Encourage improved lifestyle: - low carb/low glycemic diet reinforced prior education - Increase physical activity to 30 minutes most days of the week.  Explained that increased physical activity increases body's use of sugar for energy. 3. Check fasting am CBG and log these.  Bring log to next visit for review 4. Continue ARB and Statin 5. Advised to schedule DM ophtho exam, send record. 6. Follow-up 3 months

## 2019-08-21 ENCOUNTER — Other Ambulatory Visit: Payer: Self-pay | Admitting: Family Medicine

## 2019-08-21 DIAGNOSIS — E1165 Type 2 diabetes mellitus with hyperglycemia: Secondary | ICD-10-CM

## 2019-08-21 MED ORDER — BYDUREON BCISE 2 MG/0.85ML ~~LOC~~ AUIJ: 2.0000 mg | AUTO-INJECTOR | SUBCUTANEOUS | 1 refills | Status: DC

## 2019-08-21 NOTE — Progress Notes (Signed)
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  Rx updated and resent.

## 2019-08-22 LAB — URINE CULTURE
MICRO NUMBER:: 10370310
SPECIMEN QUALITY:: ADEQUATE

## 2019-08-24 ENCOUNTER — Other Ambulatory Visit: Payer: Self-pay | Admitting: Family Medicine

## 2019-08-24 DIAGNOSIS — N390 Urinary tract infection, site not specified: Secondary | ICD-10-CM

## 2019-08-24 MED ORDER — NITROFURANTOIN MONOHYD MACRO 100 MG PO CAPS: 100.0000 mg | ORAL_CAPSULE | Freq: Two times a day (BID) | ORAL | 0 refills | Status: AC

## 2019-08-24 NOTE — Progress Notes (Signed)
Urine culture had shown infection.  I am sending in a prescription to Erin Hayes.  To take 1 tablet 2x per day for the next 5 days and should clear the infection.

## 2019-09-22 ENCOUNTER — Other Ambulatory Visit: Payer: Self-pay

## 2019-09-22 ENCOUNTER — Ambulatory Visit (INDEPENDENT_AMBULATORY_CARE_PROVIDER_SITE_OTHER): Payer: MEDICAID

## 2019-09-22 ENCOUNTER — Ambulatory Visit (INDEPENDENT_AMBULATORY_CARE_PROVIDER_SITE_OTHER): Payer: Self-pay | Admitting: Podiatry

## 2019-09-22 DIAGNOSIS — S92302A Fracture of unspecified metatarsal bone(s), left foot, initial encounter for closed fracture: Secondary | ICD-10-CM

## 2019-09-22 DIAGNOSIS — M778 Other enthesopathies, not elsewhere classified: Secondary | ICD-10-CM

## 2019-09-22 DIAGNOSIS — M79672 Pain in left foot: Secondary | ICD-10-CM

## 2019-09-22 MED ORDER — MELOXICAM 15 MG PO TABS: 15.0000 mg | ORAL_TABLET | Freq: Every day | ORAL | 1 refills | Status: DC

## 2019-09-25 NOTE — Progress Notes (Signed)
   HPI: 54 y.o. female presenting today for follow up evaluation of bilateral foot pain. She reports an improvement in the pain but states it has not completely resolved. She has been taking Meloxicam with relief. She has been using the post op shoe as directed. Patient is here for further evaluation and treatment.   Past Medical History:  Diagnosis Date  . Carpal tunnel syndrome of right wrist   . Chronic combined systolic (congestive) and diastolic (congestive) heart failure ([place])    a. 03/2017 Echo: EF 20-25%, diff HK, Gr2 DD; b. 09/2017 Echo: EF 20-25%, diff HK, Gr2 DD.  . Diabetes ([place])   . GERD (gastroesophageal reflux disease)   . HLD (hyperlipidemia)   . Hypertension   . NICM (nonischemic cardiomyopathy) ([place])    a. 03/2017 Echo: EF 20-25%, diff HK, Gr2 DD, mild to mod MR, nl RV fxn;  b. 03/2017 Cath: mild nonobs dzs, EF 25%; c. 09/2017 Echo: EF 20-25%, diff HK. Gr2 DD. Mild MR. Mildly reduced RV fxn.  . Pleural effusion, left    a. 03/2017 s/p thoracentesis-->300 ml withdrawn--transudative.  . Pneumonia 02/19/2017     Physical Exam: General: The patient is alert and oriented x3 in no acute distress.  Dermatology: Skin is warm, dry and supple bilateral lower extremities. Negative for open lesions or macerations.  Vascular: Palpable pedal pulses bilaterally. No edema or erythema noted. Capillary refill within normal limits.  Neurological: Epicritic and protective threshold grossly intact bilaterally.   Musculoskeletal Exam: Pain with palpation noted to the lateral left midfoot. Range of motion within normal limits to all pedal and ankle joints bilateral. Muscle strength 5/5 in all groups bilateral.   Assessment: 1. Fracture left fourth metatarsal head with minimal displacement - healed  2. Capsulitis left midfoot - lateral  3. H/o fall injury left foot - October 2020   Plan of Care:  1. Patient evaluated.  2. Injection of 0.5 mLs Celestone Soluspan injected into the left  lateral midfoot.  3. Continue using post op shoe.  4. Refill prescription for Meloxicam provided to patient.  5. Return to clinic in 4 weeks.       Edrick Kins, DPM [facility]  Dr. Edrick Kins, DPM    [address], [place] [address]                Office [phone]  Fax [phone]

## 2019-10-27 ENCOUNTER — Ambulatory Visit: Payer: MEDICAID | Admitting: Podiatry

## 2019-11-20 ENCOUNTER — Other Ambulatory Visit: Payer: Self-pay

## 2019-11-20 ENCOUNTER — Ambulatory Visit: Payer: MEDICAID | Admitting: Podiatry

## 2019-11-20 ENCOUNTER — Ambulatory Visit (INDEPENDENT_AMBULATORY_CARE_PROVIDER_SITE_OTHER): Payer: Self-pay | Admitting: Podiatry

## 2019-11-20 DIAGNOSIS — M778 Other enthesopathies, not elsewhere classified: Secondary | ICD-10-CM

## 2019-11-20 DIAGNOSIS — S92302A Fracture of unspecified metatarsal bone(s), left foot, initial encounter for closed fracture: Secondary | ICD-10-CM

## 2019-11-20 DIAGNOSIS — M79672 Pain in left foot: Secondary | ICD-10-CM

## 2019-11-23 MED ORDER — MELOXICAM 15 MG PO TABS: 15.0000 mg | ORAL_TABLET | Freq: Every day | ORAL | 3 refills | Status: DC

## 2019-11-24 NOTE — Progress Notes (Signed)
   HPI: 54 y.o. female presenting today for follow up evaluation of bilateral foot pain.  Patient states that she continues to have some pain occasionally.  She has completed her meloxicam.  She states that she got minimal relief from the injection she received last visit.  No new complaints at this time and she presents for further treatment evaluation   Past Medical History:  Diagnosis Date  . Carpal tunnel syndrome of right wrist   . Chronic combined systolic (congestive) and diastolic (congestive) heart failure ([place])    a. 03/2017 Echo: EF 20-25%, diff HK, Gr2 DD; b. 09/2017 Echo: EF 20-25%, diff HK, Gr2 DD.  . Diabetes ([place])   . GERD (gastroesophageal reflux disease)   . HLD (hyperlipidemia)   . Hypertension   . NICM (nonischemic cardiomyopathy) ([place])    a. 03/2017 Echo: EF 20-25%, diff HK, Gr2 DD, mild to mod MR, nl RV fxn;  b. 03/2017 Cath: mild nonobs dzs, EF 25%; c. 09/2017 Echo: EF 20-25%, diff HK. Gr2 DD. Mild MR. Mildly reduced RV fxn.  . Pleural effusion, left    a. 03/2017 s/p thoracentesis-->300 ml withdrawn--transudative.  . Pneumonia 02/19/2017     Physical Exam: General: The patient is alert and oriented x3 in no acute distress.  Dermatology: Skin is warm, dry and supple bilateral lower extremities. Negative for open lesions or macerations.  Vascular: Palpable pedal pulses bilaterally. No edema or erythema noted. Capillary refill within normal limits.  Neurological: Epicritic and protective threshold grossly intact bilaterally.   Musculoskeletal Exam: Pain with palpation noted to the lateral left midfoot. Range of motion within normal limits to all pedal and ankle joints bilateral. Muscle strength 5/5 in all groups bilateral.   Radiographic exam: Unfortunately today we were unable to take x-rays due to system failure.  X-ray system was down at the time of presentation.  IT was contacted.  Assessment: 1. Fracture left fourth metatarsal head with minimal displacement -  healed  2. Capsulitis left midfoot - lateral  3. H/o fall injury left foot - October 2020   Plan of Care:  1. Patient evaluated.  2.  Refill prescription for meloxicam 15 mg daily as needed 3.  Discontinue the postoperative shoe.  The patient has now been in the postoperative shoe for approximately 3 months now.  Recommend good supportive sneakers 4.  Return to clinic in 6 weeks for follow-up x-ray   Edrick Kins, DPM [facility]  Dr. Edrick Kins, DPM    [address], [place] [address]                Office [phone]  Fax [phone]

## 2019-11-25 ENCOUNTER — Encounter: Payer: Medicaid Other | Attending: Internal Medicine | Admitting: Internal Medicine

## 2019-11-25 ENCOUNTER — Other Ambulatory Visit: Payer: Self-pay

## 2019-11-25 DIAGNOSIS — R21 Rash and other nonspecific skin eruption: Secondary | ICD-10-CM | POA: Diagnosis not present

## 2019-11-25 DIAGNOSIS — E11622 Type 2 diabetes mellitus with other skin ulcer: Secondary | ICD-10-CM | POA: Insufficient documentation

## 2019-11-25 DIAGNOSIS — Z809 Family history of malignant neoplasm, unspecified: Secondary | ICD-10-CM | POA: Diagnosis not present

## 2019-11-25 DIAGNOSIS — J449 Chronic obstructive pulmonary disease, unspecified: Secondary | ICD-10-CM | POA: Diagnosis not present

## 2019-11-25 DIAGNOSIS — I110 Hypertensive heart disease with heart failure: Secondary | ICD-10-CM | POA: Diagnosis not present

## 2019-11-25 DIAGNOSIS — Z8249 Family history of ischemic heart disease and other diseases of the circulatory system: Secondary | ICD-10-CM | POA: Insufficient documentation

## 2019-11-25 DIAGNOSIS — E785 Hyperlipidemia, unspecified: Secondary | ICD-10-CM | POA: Diagnosis not present

## 2019-11-25 DIAGNOSIS — G4730 Sleep apnea, unspecified: Secondary | ICD-10-CM | POA: Insufficient documentation

## 2019-11-25 DIAGNOSIS — I5022 Chronic systolic (congestive) heart failure: Secondary | ICD-10-CM | POA: Insufficient documentation

## 2019-11-25 DIAGNOSIS — E1140 Type 2 diabetes mellitus with diabetic neuropathy, unspecified: Secondary | ICD-10-CM | POA: Diagnosis not present

## 2019-11-25 NOTE — Progress Notes (Signed)
Erin, Hayes ([id number]) Visit Report for 11/25/2019 Abuse/Suicide Risk Screen Details Patient Name: Erin Hayes, Erin Hayes. Date of Service: 11/25/2019 9:15 AM Medical Record Number: [id number] Patient Account Number: [id number] Date of Birth/Sex: 1965/12/21 (54 y.o. F) Treating RN: Army Melia Primary Care Rosena Bartle: MALFI, NICOLE Other Clinician: Referring Fonnie Crookshanks: Referral, Self Treating Terrence Wishon/Extender: Ricard Dillon Weeks in Treatment: 0 Abuse/Suicide Risk Screen Items Answer ABUSE RISK SCREEN: Has anyone close to you tried to hurt or harm you recentlyo No Do you feel uncomfortable with anyone in your familyo No Has anyone forced you do things that you didnot want to doo No Electronic Signature(s) Signed: 11/25/2019 4:24:58 PM By: Army Melia Entered By: Army Melia on 11/25/2019 09:26:49 Macht, Peyten L. ([id number]) -------------------------------------------------------------------------------- Activities of Daily Living Details Patient Name: Erin Hayes, Erin L. Date of Service: 11/25/2019 9:15 AM Medical Record Number: [id number] Patient Account Number: [id number] Date of Birth/Sex: January 27, 1966 (54 y.o. F) Treating RN: Army Melia Primary Care Amorie Rentz: MALFI, NICOLE Other Clinician: Referring Sevon Rotert: Referral, Self Treating Erin Hayes/Extender: Ricard Dillon Weeks in Treatment: 0 Activities of Daily Living Items Answer Activities of Daily Living (Please select one for each item) Drive Automobile Completely Able Take Medications Completely Able Use Telephone Completely Able Care for Appearance Completely Able Use Toilet Completely Able Bath / Shower Completely Able Dress Self Completely Able Feed Self Completely Able Walk Completely Able Get In / Out Bed Completely Able Housework Completely Able Prepare Meals Completely Able Handle Money Completely Able Shop for Self Completely Able Electronic Signature(s) Signed: 11/25/2019 4:24:58 PM By: Army Melia Entered By: Army Melia on 11/25/2019 09:27:10 Kral, Erin Hayes ([id number]) -------------------------------------------------------------------------------- Education Screening Details Patient Name: Erin Hayes, Erin L. Date of Service: 11/25/2019 9:15 AM Medical Record Number: [id number] Patient Account Number: [id number] Date of Birth/Sex: 03/30/1966 (54 y.o. F) Treating RN: Army Melia Primary Care Gloria Ricardo: MALFI, NICOLE Other Clinician: Referring Mikeila Burgen: Referral, Self Treating Amilah Greenspan/Extender: Tito Dine in Treatment: 0 Primary Learner Assessed: Patient Learning Preferences/Education Level/Primary Language Learning Preference: Explanation, Demonstration Highest Education Level: College or Above Preferred Language: English Cognitive Barrier Language Barrier: No Translator Needed: No Memory Deficit: No Emotional Barrier: No Cultural/Religious Beliefs Affecting Medical Care: No Physical Barrier Impaired Vision: No Impaired Hearing: No Decreased Hand dexterity: No Knowledge/Comprehension Knowledge Level: High Comprehension Level: High Ability to understand written instructions: High Ability to understand verbal instructions: High Motivation Anxiety Level: Calm Cooperation: Cooperative Education Importance: Acknowledges Need Interest in Health Problems: Asks Questions Perception: Coherent Willingness to Engage in Self-Management High Activities: Readiness to Engage in Self-Management High Activities: Electronic Signature(s) Signed: 11/25/2019 4:24:58 PM By: Army Melia Entered By: Army Melia on 11/25/2019 09:27:26 Erin Hayes ([id number]) -------------------------------------------------------------------------------- Fall Risk Assessment Details Patient Name: Erin Hayes, Erin L. Date of Service: 11/25/2019 9:15 AM Medical Record Number: [id number] Patient Account Number: [id number] Date of Birth/Sex: [birth year] (54 y.o. F) Treating RN:  Army Melia Primary Care Farzana Koci: MALFI, NICOLE Other Clinician: Referring Geovana Gebel: Referral, Self Treating Erin Hayes/Extender: Ricard Dillon Weeks in Treatment: 0 Fall Risk Assessment Items Have you had 2 or more falls in the last 12 monthso 0 No Have you had any fall that resulted in injury in the last 12 monthso 0 No FALLS RISK SCREEN History of falling - immediate or within 3 months 0 No Secondary diagnosis (Do you have 2 or more medical diagnoseso) 0 No Ambulatory aid None/bed rest/wheelchair/nurse 0 No Crutches/cane/walker 0 No Furniture 0 No Intravenous therapy Access/Saline/Heparin Lock 0 No Gait/Transferring Normal/ bed rest/ wheelchair 0 No Weak (short  steps with or without shuffle, stooped but able to lift head while walking, may 0 No seek support from furniture) Impaired (short steps with shuffle, may have difficulty arising from chair, head down, impaired 0 No balance) Mental Status Oriented to own ability 0 No Electronic Signature(s) Signed: 11/25/2019 4:24:58 PM By: Army Melia Entered By: Army Melia on 11/25/2019 09:27:31 Dimaria, Erin L. ([id number]) -------------------------------------------------------------------------------- Foot Assessment Details Patient Name: Erin Hayes, Erin L. Date of Service: 11/25/2019 9:15 AM Medical Record Number: [id number] Patient Account Number: [id number] Date of Birth/Sex: June 07, 1965 (54 y.o. F) Treating RN: Army Melia Primary Care Cisco Kindt: MALFI, NICOLE Other Clinician: Referring Shakir Petrosino: Referral, Self Treating Erin Hayes/Extender: Ricard Dillon Weeks in Treatment: 0 Foot Assessment Items Site Locations + = Sensation present, - = Sensation absent, C = Callus, U = Ulcer R = Redness, W = Warmth, M = Maceration, PU = Pre-ulcerative lesion F = Fissure, S = Swelling, D = Dryness Assessment Right: Left: Other Deformity: No No Prior Foot Ulcer: No No Prior Amputation: No No Charcot Joint: No No Ambulatory  Status: Ambulatory Without Help Gait: Steady Electronic Signature(s) Signed: 11/25/2019 4:24:58 PM By: Army Melia Entered By: Army Melia on 11/25/2019 09:28:58 Michaux, Nyilah Carlean Hayes ([id number]) -------------------------------------------------------------------------------- Nutrition Risk Screening Details Patient Name: Schnelle, Esli L. Date of Service: 11/25/2019 9:15 AM Medical Record Number: [id number] Patient Account Number: [id number] Date of Birth/Sex: 28-Jan-1966 (54 y.o. F) Treating RN: Army Melia Primary Care Karlye Ihrig: MALFI, NICOLE Other Clinician: Referring Shereese Bonnie: Referral, Self Treating Cailean Heacock/Extender: Ricard Dillon Weeks in Treatment: 0 Height (in): 59 Weight (lbs): 113 Body Mass Index (BMI): 22.8 Nutrition Risk Screening Items Score Screening NUTRITION RISK SCREEN: I have an illness or condition that made me change the kind and/or amount of food I eat 0 No I eat fewer than two meals per day 0 No I eat few fruits and vegetables, or milk products 0 No I have three or more drinks of beer, liquor or wine almost every day 0 No I have tooth or mouth problems that make it hard for me to eat 0 No I don't always have enough money to buy the food I need 0 No I eat alone most of the time 0 No I take three or more different prescribed or over-the-counter drugs a day 0 No Without wanting to, I have lost or gained 10 pounds in the last six months 0 No I am not always physically able to shop, cook and/or feed myself 0 No Nutrition Protocols Good Risk Protocol 0 No interventions needed Moderate Risk Protocol High Risk Proctocol Risk Level: Good Risk Score: 0 Electronic Signature(s) Signed: 11/25/2019 4:24:58 PM By: Army Melia Entered By: Army Melia on 11/25/2019 09:27:36

## 2019-11-26 ENCOUNTER — Other Ambulatory Visit: Payer: Self-pay

## 2019-11-26 ENCOUNTER — Encounter: Payer: Self-pay | Admitting: Family Medicine

## 2019-11-26 ENCOUNTER — Ambulatory Visit (INDEPENDENT_AMBULATORY_CARE_PROVIDER_SITE_OTHER): Payer: Medicaid Other | Admitting: Family Medicine

## 2019-11-26 VITALS — BP 100/72 | HR 95 | Temp 99.0°F | Resp 17 | Ht 59.0 in | Wt 113.0 lb

## 2019-11-26 DIAGNOSIS — E119 Type 2 diabetes mellitus without complications: Secondary | ICD-10-CM

## 2019-11-26 DIAGNOSIS — I5022 Chronic systolic (congestive) heart failure: Secondary | ICD-10-CM | POA: Diagnosis not present

## 2019-11-26 DIAGNOSIS — I10 Essential (primary) hypertension: Secondary | ICD-10-CM

## 2019-11-26 DIAGNOSIS — M778 Other enthesopathies, not elsewhere classified: Secondary | ICD-10-CM

## 2019-11-26 DIAGNOSIS — L989 Disorder of the skin and subcutaneous tissue, unspecified: Secondary | ICD-10-CM

## 2019-11-26 DIAGNOSIS — E1165 Type 2 diabetes mellitus with hyperglycemia: Secondary | ICD-10-CM | POA: Diagnosis not present

## 2019-11-26 DIAGNOSIS — R21 Rash and other nonspecific skin eruption: Secondary | ICD-10-CM | POA: Insufficient documentation

## 2019-11-26 LAB — POCT GLYCOSYLATED HEMOGLOBIN (HGB A1C): Hemoglobin A1C: 8.3 % — AB (ref 4.0–5.6)

## 2019-11-26 MED ORDER — INSULIN PEN NEEDLE 31G X 5 MM MISC: 3 refills | Status: DC

## 2019-11-26 MED ORDER — BYDUREON BCISE 2 MG/0.85ML ~~LOC~~ AUIJ: 2.0000 mg | AUTO-INJECTOR | SUBCUTANEOUS | 1 refills | Status: DC

## 2019-11-26 MED ORDER — MELOXICAM 15 MG PO TABS: 15.0000 mg | ORAL_TABLET | Freq: Every day | ORAL | 1 refills | Status: DC

## 2019-11-26 MED ORDER — SACUBITRIL-VALSARTAN 49-51 MG PO TABS: 1.0000 | ORAL_TABLET | Freq: Two times a day (BID) | ORAL | 0 refills | Status: DC

## 2019-11-26 MED ORDER — CARVEDILOL 12.5 MG PO TABS: 12.5000 mg | ORAL_TABLET | Freq: Two times a day (BID) | ORAL | 3 refills | Status: DC

## 2019-11-26 MED ORDER — INSULIN GLARGINE 100 UNIT/ML SOLOSTAR PEN: 14.0000 [IU] | PEN_INJECTOR | Freq: Every day | SUBCUTANEOUS | 3 refills | Status: DC

## 2019-11-26 MED ORDER — ROSUVASTATIN CALCIUM 20 MG PO TABS: 20.0000 mg | ORAL_TABLET | Freq: Every day | ORAL | 3 refills | Status: DC

## 2019-11-26 MED ORDER — ALBUTEROL SULFATE HFA 108 (90 BASE) MCG/ACT IN AERS: 2.0000 | INHALATION_SPRAY | RESPIRATORY_TRACT | 1 refills | Status: DC | PRN

## 2019-11-26 MED ORDER — HYDROCORTISONE 2 % EX LOTN: 1.0000 "application " | TOPICAL_LOTION | Freq: Two times a day (BID) | CUTANEOUS | 1 refills | Status: DC

## 2019-11-26 MED ORDER — SPIRONOLACTONE 25 MG PO TABS: 25.0000 mg | ORAL_TABLET | Freq: Every day | ORAL | 2 refills | Status: DC

## 2019-11-26 MED ORDER — FUROSEMIDE 20 MG PO TABS: 20.0000 mg | ORAL_TABLET | Freq: Every day | ORAL | 3 refills | Status: DC

## 2019-11-26 MED ORDER — METFORMIN HCL 500 MG PO TABS: 500.0000 mg | ORAL_TABLET | Freq: Two times a day (BID) | ORAL | 1 refills | Status: DC

## 2019-11-26 NOTE — Patient Instructions (Addendum)
For Mammogram screening for breast cancer   Call the [facility] below anytime to schedule your own appointment now that order has been placed.  [facility] [place], [place] [address] Phone: [phone]  [facility] [address] [place], [place] [address] Phone: [phone]  Have your labs drawn in the next 2 weeks and we will contact you with the results.  Continue all medications as directed  I sent in a prescription for Hydrocortisone lotion to apply to the skin 2x per day.  Can wash the skin with a mild soap 2x per day and pat dry  We will plan to see you back in 3 months for hypertension, diabetes and PAP testing  You will receive a survey after today's visit either digitally by e-mail or paper by [place] mail. Your experiences and feedback matter to [place].  Please respond so we know how we are doing as we provide care for you.  Call us with any questions/concerns/needs.  It is my goal to be available to you for your health concerns.  Thanks for choosing me to be a partner in your healthcare needs!  Harlin Rain, FNP-C Family Nurse Practitioner [facility] Phone: [phone]

## 2019-11-26 NOTE — Assessment & Plan Note (Signed)
Multiple areas of excoriation and scabs noted to bilateral lower extremities and upper extremities. Patient reports the building she lives in has cockroaches and believes they are bites from this.  No s/s of infection from any of the areas.  All scabbed over well.  Discussed with any cuts/excoriations and her diabetes, it can take longer to heal than someone without diabetes.  Discussed using mild soap, such as dial gold, to wash the arms/legs twice per day, then applying topical hydrocortisone lotion 2x per day.  To avoid picking/scratching at the scabs.  Plan: 1. Wash with mild soap 2x per day, pat dry and apply hydrocortisone 2% topical lotion. 2. RTC if symptoms worsen or fail to improve

## 2019-11-26 NOTE — Assessment & Plan Note (Signed)
UncontrolledDM with A1c 8.3% improved from 9.8% on 08/20/2019 and goal A1c < 7.0%. - Complications - hyperglycemia.  Plan:  1. Continue current therapy: bydureon 2mg  weekly, lantus 14 units nightly and metformin 500mg  BID 2. Encourage improved lifestyle: - low carb/low glycemic diet reinforced prior education - Increase physical activity to 30 minutes most days of the week.  Explained that increased physical activity increases body's use of sugar for energy. 3. Check fasting am CBG and log these.  Bring log to next visit for review 4. Continue ASA, ARB and Statin 5. Up to date on foot and DM eye exam 6. Follow-up 3 months

## 2019-11-26 NOTE — Assessment & Plan Note (Signed)
Controlled hypertension.  BP is at goal < 130/80.  Pt working on lifestyle modifications.  Taking medications tolerating well without side effects.   Plan: 1. Continue taking sacubitril-valsartan 49-51mg  twice daily, spironolactone 25mg  once daily and carvedilol 12.5mg  twice daily 2. Obtain labs in next 1-2 weeks  3. Encouraged heart healthy diet and increasing exercise to 30 minutes most days of the week, going no more than 2 days in a row without exercise. 4. Check BP 1-2 x per week at home, keep log, and bring to clinic at next appointment. 5. Follow up 3 months.

## 2019-11-26 NOTE — Progress Notes (Signed)
Subjective:    Patient ID: Erin Hayes, female    DOB: 12/14/1965, 54 y.o.   MRN: [id number]  Erin Hayes is a 54 y.o. female presenting on 11/26/2019 for Diabetes, Hypertension, and Rash   HPI  Health Maintenance:  Diabetes Pt presents today for follow up Type 2 Diabetes Mellitus.  He/she (caps): She ACTION; IS/IS NOT: is checking AM CBG at home with range of 120's. -Current diabetic medications include: bydureon 2mg  weekly, lantus 14 units nightly and metformin 500mg  BID -ACTION; IS/IS NOT: is not currently symptomatic -Actions; denies/reports/admits to: denies polydipsia, polyphagia, polyuria, headaches, diaphoresis, shakiness, chills, pain, numbness or tingling in extremities or changes in vision -Clinical course has been improving  -Reports no exercise routine -Diet is high in salt, high in fat, and high in carbohydrates  PREVENTION Eye exam current (within 1 year) Up to date Foot exam current (within 1 year) Up to date Lipid/ASCVD risk reduction - on statin: YES/NO: Yes  Kidney Protection (On ACE/ARB)? YES/NO: Yes   Hypertension - She is not checking BP at home or outside of clinic.    - Current medications: sacubitril-valsartain 49-51 mg twice daily, spironolactone 25mg  once daily and carvedilol 12.5mg  twice daily, tolerating well without side effects - She is not currently symptomatic. - Pt denies headache, lightheadedness, dizziness, changes in vision, chest tightness/pressure, palpitations, leg swelling, sudden loss of speech or loss of consciousness. - She  reports no regular exercise routine. - Her diet is high in salt, high in fat, and high in carbohydrates.   Depression screen [facility] 2/9 05/18/2019 03/04/2018 09/03/2017  Decreased Interest 0 0 0  Down, Depressed, Hopeless 0 0 0  PHQ - 2 Score 0 0 0    Social History   Tobacco Use  . Smoking status: Never Smoker  . Smokeless tobacco: Never Used  Vaping Use  . Vaping Use: Never used  Substance Use Topics   . Alcohol use: No  . Drug use: No    Review of Systems  Constitutional: Negative.   HENT: Negative.   Eyes: Negative.   Respiratory: Negative.   Cardiovascular: Negative.   Gastrointestinal: Negative.   Endocrine: Negative.   Genitourinary: Negative.   Musculoskeletal: Negative.   Skin: Positive for rash.  Allergic/Immunologic: Negative.   Neurological: Negative.   Hematological: Negative.   Psychiatric/Behavioral: Negative.    Per HPI unless specifically indicated above     Objective:    BP 100/72 (BP Location: Left Arm, Patient Position: Sitting, Cuff Size: Normal)   Pulse 95   Temp 99 F (37.2 C) (Oral)   Resp 17   Ht 4\' 11"  (1.499 m)   Wt 113 lb (51.3 kg)   SpO2 100%   BMI 22.82 kg/m   Wt Readings from Last 3 Encounters:  11/26/19 113 lb (51.3 kg)  08/20/19 120 lb 9.6 oz (54.7 kg)  06/09/19 116 lb 4 oz (52.7 kg)    Physical Exam Vitals reviewed.  Constitutional:      General: She is not in acute distress.    Appearance: Normal appearance. She is well-developed, well-groomed and normal weight. She is not ill-appearing or toxic-appearing.  HENT:     Head: Normocephalic and atraumatic.     Nose:     Comments: Lizbeth Bark is in place, covering mouth and nose. Eyes:     General: Lids are normal. Vision grossly intact.        Right eye: No discharge.        Left eye: No  discharge.     Extraocular Movements: Extraocular movements intact.     Conjunctiva/sclera: Conjunctivae normal.     Pupils: Pupils are equal, round, and reactive to light.  Cardiovascular:     Rate and Rhythm: Normal rate and regular rhythm.     Pulses: Normal pulses.          Dorsalis pedis pulses are 2+ on the right side and 2+ on the left side.     Heart sounds: Normal heart sounds. No murmur heard.  No friction rub. No gallop.   Pulmonary:     Effort: Pulmonary effort is normal. No respiratory distress.     Breath sounds: Normal breath sounds.  Musculoskeletal:     Right lower leg:  No edema.     Left lower leg: No edema.  Skin:    General: Skin is warm and dry.     Capillary Refill: Capillary refill takes less than 2 seconds.     Findings: Rash present.     Comments: Multiple areas of excoriation and scabbing noted to bilateral lower extremities and upper extremities.  No s/s of infection.  In various stages of healing.  Neurological:     General: No focal deficit present.     Mental Status: She is alert and oriented to person, place, and time.  Psychiatric:        Attention and Perception: Attention and perception normal.        Mood and Affect: Mood and affect normal.        Speech: Speech normal.        Behavior: Behavior normal. Behavior is cooperative.        Thought Content: Thought content normal.        Cognition and Memory: Cognition and memory normal.        Judgment: Judgment normal.     Results for orders placed or performed in visit on 11/26/19  POCT glycosylated hemoglobin (Hb A1C)  Result Value Ref Range   Hemoglobin A1C 8.3 (A) 4.0 - 5.6 %   HbA1c POC (<> result, manual entry)     HbA1c, POC (prediabetic range)     HbA1c, POC (controlled diabetic range)        Assessment & Plan:   Problem List Items Addressed This Visit      Cardiovascular and Mediastinum   Hypertension    Controlled hypertension.  BP is at goal < 130/80.  Pt working on lifestyle modifications.  Taking medications tolerating well without side effects.   Plan: 1. Continue taking sacubitril-valsartan 49-51mg  twice daily, spironolactone 25mg  once daily and carvedilol 12.5mg  twice daily 2. Obtain labs in next 1-2 weeks  3. Encouraged heart healthy diet and increasing exercise to 30 minutes most days of the week, going no more than 2 days in a row without exercise. 4. Check BP 1-2 x per week at home, keep log, and bring to clinic at next appointment. 5. Follow up 3 months.         Relevant Medications   cholestyramine (QUESTRAN) 4 g packet   carvedilol (COREG) 12.5  MG tablet   furosemide (LASIX) 20 MG tablet   rosuvastatin (CRESTOR) 20 MG tablet   sacubitril-valsartan (ENTRESTO) 49-51 MG   spironolactone (ALDACTONE) 25 MG tablet   CHF (congestive heart failure) (HCC)   Relevant Medications   cholestyramine (QUESTRAN) 4 g packet   albuterol (VENTOLIN HFA) 108 (90 Base) MCG/ACT inhaler   carvedilol (COREG) 12.5 MG tablet   furosemide (LASIX) 20 MG  tablet   rosuvastatin (CRESTOR) 20 MG tablet   sacubitril-valsartan (ENTRESTO) 49-51 MG   spironolactone (ALDACTONE) 25 MG tablet     Endocrine   Uncontrolled type 2 diabetes mellitus with hyperglycemia ([place]) - Primary    UncontrolledDM with A1c 8.3% improved from 9.8% on 08/20/2019 and goal A1c < 7.0%. - Complications - hyperglycemia.  Plan:  1. Continue current therapy: bydureon 2mg  weekly, lantus 14 units nightly and metformin 500mg  BID 2. Encourage improved lifestyle: - low carb/low glycemic diet reinforced prior education - Increase physical activity to 30 minutes most days of the week.  Explained that increased physical activity increases body's use of sugar for energy. 3. Check fasting am CBG and log these.  Bring log to next visit for review 4. Continue ASA, ARB and Statin 5. Up to date on foot and DM eye exam 6. Follow-up 3 months       Relevant Medications   Exenatide ER (BYDUREON BCISE) 2 MG/0.85ML AUIJ   insulin glargine (LANTUS) 100 UNIT/ML Solostar Pen   Insulin Pen Needle 31G X 5 MM MISC   metFORMIN (GLUCOPHAGE) 500 MG tablet   rosuvastatin (CRESTOR) 20 MG tablet   Other Relevant Orders   POCT glycosylated hemoglobin (Hb A1C) (Completed)   POCT Urinalysis Dipstick   POCT UA - Microalbumin     Musculoskeletal and Integument   Rash and nonspecific skin eruption    Multiple areas of excoriation and scabs noted to bilateral lower extremities and upper extremities. Patient reports the building she lives in has cockroaches and believes they are bites from this.  No s/s of infection  from any of the areas.  All scabbed over well.  Discussed with any cuts/excoriations and her diabetes, it can take longer to heal than someone without diabetes.  Discussed using mild soap, such as dial gold, to wash the arms/legs twice per day, then applying topical hydrocortisone lotion 2x per day.  To avoid picking/scratching at the scabs.  Plan: 1. Wash with mild soap 2x per day, pat dry and apply hydrocortisone 2% topical lotion. 2. RTC if symptoms worsen or fail to improve       Other Visit Diagnoses    Diabetes mellitus without complication (HCC)       Relevant Medications   Exenatide ER (BYDUREON BCISE) 2 MG/0.85ML AUIJ   insulin glargine (LANTUS) 100 UNIT/ML Solostar Pen   metFORMIN (GLUCOPHAGE) 500 MG tablet   rosuvastatin (CRESTOR) 20 MG tablet   Capsulitis of left foot       Relevant Medications   meloxicam (MOBIC) 15 MG tablet   Skin lesion       Relevant Medications   HYDROCORTISONE, TOPICAL, 2 % LOTN      Meds ordered this encounter  Medications  . albuterol (VENTOLIN HFA) 108 (90 Base) MCG/ACT inhaler    Sig: Inhale 2 puffs into the lungs every 4 (four) hours as needed for wheezing or shortness of breath.    Dispense:  6.7 g    Refill:  1  . carvedilol (COREG) 12.5 MG tablet    Sig: Take 1 tablet (12.5 mg total) by mouth 2 (two) times daily.    Dispense:  180 tablet    Refill:  3  . Exenatide ER (BYDUREON BCISE) 2 MG/0.85ML AUIJ    Sig: Inject 2 mg into the skin once a week.    Dispense:  4 pen    Refill:  1  . furosemide (LASIX) 20 MG tablet    Sig: Take 1  tablet (20 mg total) by mouth daily.    Dispense:  90 tablet    Refill:  3  . insulin glargine (LANTUS) 100 UNIT/ML Solostar Pen    Sig: Inject 14 Units into the skin at bedtime.    Dispense:  15 mL    Refill:  3  . Insulin Pen Needle 31G X 5 MM MISC    Sig: Use as directed with Lantus    Dispense:  100 each    Refill:  3  . meloxicam (MOBIC) 15 MG tablet    Sig: Take 1 tablet (15 mg total) by mouth  daily.    Dispense:  90 tablet    Refill:  1  . metFORMIN (GLUCOPHAGE) 500 MG tablet    Sig: Take 1 tablet (500 mg total) by mouth 2 (two) times daily with a meal.    Dispense:  180 tablet    Refill:  1  . rosuvastatin (CRESTOR) 20 MG tablet    Sig: Take 1 tablet (20 mg total) by mouth daily.    Dispense:  90 tablet    Refill:  3  . sacubitril-valsartan (ENTRESTO) 49-51 MG    Sig: Take 1 tablet by mouth 2 (two) times daily.    Dispense:  180 tablet    Refill:  0  . spironolactone (ALDACTONE) 25 MG tablet    Sig: Take 1 tablet (25 mg total) by mouth daily. MUST MAKE APPOINTMENT FOR FURTHER REFILLS    Dispense:  90 tablet    Refill:  2  . HYDROCORTISONE, TOPICAL, 2 % LOTN    Sig: Apply 1 application topically 2 (two) times daily.    Dispense:  59.2 mL    Refill:  1      Follow up plan: Return in about 3 months (around 02/26/2020) for HTN, DM, A1C, PAP testing.   Harlin Rain, [place] Family Nurse Practitioner [facility] 11/26/2019, 3:42 PM

## 2019-11-27 NOTE — Progress Notes (Addendum)
CORDIE, BEAZLEY ([id number]) Visit Report for 11/25/2019 Allergy List Details Patient Name: Erin Hayes, Erin Hayes. Date of Service: 11/25/2019 9:15 AM Medical Record Number: [id number] Patient Account Number: [id number] Date of Birth/Sex: 05-Jan-1966 (54 y.o. F) Treating RN: Army Melia Primary Care Tonnie Friedel: MALFI, NICOLE Other Clinician: Referring Ell Tiso: Referral, Self Treating Malakhai Beitler/Extender: Ricard Dillon Weeks in Treatment: 0 Allergies Active Allergies No Known Allergies Allergy Notes Electronic Signature(s) Signed: 11/25/2019 4:24:58 PM By: Army Melia Entered By: Army Melia on 11/25/2019 09:23:13 Kubicek, Ertha [name] ([id number]) -------------------------------------------------------------------------------- Arrival Information Details Patient Name: Erin Hayes, Erin L. Date of Service: 11/25/2019 9:15 AM Medical Record Number: [id number] Patient Account Number: [id number] Date of Birth/Sex: [birth year] (54 y.o. F) Treating RN: Army Melia Primary Care Stacey Sago: MALFI, NICOLE Other Clinician: Referring Seab Axel: Referral, Self Treating Jordi Kamm/Extender: Tito Dine in Treatment: 0 Visit Information Patient Arrived: Ambulatory Arrival Time: 09:19 Accompanied By: friend Transfer Assistance: None Patient Identification Verified: Yes Electronic Signature(s) Signed: 11/25/2019 4:24:58 PM By: Army Melia Entered By: Army Melia on 11/25/2019 09:20:14 Schoenfelder, Enas [name] ([id number]) -------------------------------------------------------------------------------- Clinic Level of Care Assessment Details Patient Name: Erin Hayes, Erin L. Date of Service: 11/25/2019 9:15 AM Medical Record Number: [id number] Patient Account Number: [id number] Date of Birth/Sex: 1965-06-16 (54 y.o. F) Treating RN: Cornell Barman Primary Care Dewie Ahart: MALFI, NICOLE Other Clinician: Referring Kenedee Molesky: Referral, Self Treating Xariah Silvernail/Extender: Tito Dine in Treatment: 0 Clinic Level  of Care Assessment Items TOOL 2 Quantity Score []  - Use when only an EandM is performed on the INITIAL visit 0 ASSESSMENTS - Nursing Assessment / Reassessment X - General Physical Exam (combine w/ comprehensive assessment (listed just below) when performed on new 1 20 pt. evals) X- 1 25 Comprehensive Assessment (HX, ROS, Risk Assessments, Wounds Hx, etc.) ASSESSMENTS - Wound and Skin Assessment / Reassessment []  - Simple Wound Assessment / Reassessment - one wound 0 X- 7 5 Complex Wound Assessment / Reassessment - multiple wounds []  - 0 Dermatologic / Skin Assessment (not related to wound area) ASSESSMENTS - Ostomy and/or Continence Assessment and Care []  - Incontinence Assessment and Management 0 []  - 0 Ostomy Care Assessment and Management (repouching, etc.) PROCESS - Coordination of Care X - Simple Patient / Family Education for ongoing care 1 15 []  - 0 Complex (extensive) Patient / Family Education for ongoing care []  - 0 Staff obtains Programmer, systems, Records, Test Results / Process Orders []  - 0 Staff telephones HHA, Nursing Homes / Clarify orders / etc []  - 0 Routine Transfer to another Facility (non-emergent condition) []  - 0 Routine Hospital Admission (non-emergent condition) []  - 0 New Admissions / Biomedical engineer / Ordering NPWT, Apligraf, etc. []  - 0 Emergency Hospital Admission (emergent condition) X- 1 10 Simple Discharge Coordination []  - 0 Complex (extensive) Discharge Coordination PROCESS - Special Needs []  - Pediatric / Minor Patient Management 0 []  - 0 Isolation Patient Management []  - 0 Hearing / Language / Visual special needs []  - 0 Assessment of Community assistance (transportation, D/C planning, etc.) []  - 0 Additional assistance / Altered mentation []  - 0 Support Surface(s) Assessment (bed, cushion, seat, etc.) INTERVENTIONS - Wound Cleansing / Measurement X - Wound Imaging (photographs - any number of wounds) 1 5 []  - 0 Wound Tracing  (instead of photographs) []  - 0 Simple Wound Measurement - one wound X- 7 5 Complex Wound Measurement - multiple wounds Blackham, Mattalyn L. ([id number]) []  - 0 Simple Wound Cleansing - one wound []  - 0 Complex Wound Cleansing - multiple wounds INTERVENTIONS -  Wound Dressings []  - Small Wound Dressing one or multiple wounds 0 []  - 0 Medium Wound Dressing one or multiple wounds []  - 0 Large Wound Dressing one or multiple wounds []  - 0 Application of Medications - injection INTERVENTIONS - Miscellaneous []  - External ear exam 0 []  - 0 Specimen Collection (cultures, biopsies, blood, body fluids, etc.) []  - 0 Specimen(s) / Culture(s) sent or taken to Lab for analysis []  - 0 Patient Transfer (multiple staff / Civil Service fast streamer / Similar devices) []  - 0 Simple Staple / Suture removal (25 or less) []  - 0 Complex Staple / Suture removal (26 or more) []  - 0 Hypo / Hyperglycemic Management (close monitor of Blood Glucose) X- 1 15 Ankle / Brachial Index (ABI) - do not check if billed separately Has the patient been seen at the hospital within the last three years: Yes Total Score: 160 Level Of Care: New/Established - Level 5 Electronic Signature(s) Signed: 11/26/2019 6:25:29 PM By: Gretta Cool, BSN, RN, CWS, Kim RN, BSN Entered By: Gretta Cool, BSN, RN, CWS, Kim on 11/25/2019 10:12:47 [place], Massie Kluver ([id number]) -------------------------------------------------------------------------------- Encounter Discharge Information Details Patient Name: Erin Hayes, Erin L. Date of Service: 11/25/2019 9:15 AM Medical Record Number: [id number] Patient Account Number: [id number] Date of Birth/Sex: 1965-06-15 (54 y.o. F) Treating RN: Cornell Barman Primary Care Avaleen Brownley: MALFI, NICOLE Other Clinician: Referring Jaymie Misch: Referral, Self Treating Yaziel Brandon/Extender: Tito Dine in Treatment: 0 Encounter Discharge Information Items Discharge Condition: Stable Ambulatory Status: Ambulatory Discharge  Destination: Home Transportation: Private Auto Accompanied By: friend Schedule Follow-up Appointment: Yes Clinical Summary of Care: Electronic Signature(s) Signed: 11/26/2019 6:25:29 PM By: Gretta Cool, BSN, RN, CWS, Kim RN, BSN Entered By: Gretta Cool, BSN, RN, CWS, Kim on 11/25/2019 10:14:10 [name] ([id number]) -------------------------------------------------------------------------------- Lower Extremity Assessment Details Patient Name: Heidenreich, Irine L. Date of Service: 11/25/2019 9:15 AM Medical Record Number: [id number] Patient Account Number: [id number] Date of Birth/Sex: [birth year] (54 y.o. F) Treating RN: Army Melia Primary Care Keano Guggenheim: MALFI, NICOLE Other Clinician: Referring Khylon Davies: Referral, Self Treating Aashna Matson/Extender: Ricard Dillon Weeks in Treatment: 0 Edema Assessment Assessed: [Left: No] [Right: No] Edema: [Left: No] [Right: No] Calf Left: Right: Point of Measurement: 29 cm From Medial Instep 30 cm 31 cm Ankle Left: Right: Point of Measurement: 10 cm From Medial Instep 17 cm 19 cm Vascular Assessment Pulses: Dorsalis Pedis Palpable: [Left:Yes] [Right:Yes] Doppler Audible: [Left:Yes] [Right:Yes] Posterior Tibial Doppler Audible: [Left:Yes] [Right:Yes] Blood Pressure: Brachial: [Left:108] [Right:108] Dorsalis Pedis: 106 [Left:Dorsalis Pedis: 741] Ankle: Posterior Tibial: 120 [Left:Posterior Tibial: 124 1.11] [Right:1.16] Electronic Signature(s) Signed: 11/25/2019 4:24:58 PM By: Army Melia Entered By: Army Melia on 11/25/2019 09:53:55 Conroy, Abbeygail L. ([id number]) -------------------------------------------------------------------------------- Multi Wound Chart Details Patient Name: Erin Hayes, Felica L. Date of Service: 11/25/2019 9:15 AM Medical Record Number: [id number] Patient Account Number: [id number] Date of Birth/Sex: 04/17/1966 (54 y.o. F) Treating RN: Cornell Barman Primary Care Donnis Pecha: MALFI, NICOLE Other Clinician: Referring Demesha Boorman:  Referral, Self Treating Kaylin Marcon/Extender: Ricard Dillon Weeks in Treatment: 0 Vital Signs Height(in): 59 Pulse(bpm): 60 Weight(lbs): 113 Blood Pressure(mmHg): 103/82 Body Mass Index(BMI): 23 Temperature(F): 98.2 Respiratory Rate(breaths/min): 16 Photos: Wound Location: Right Upper Arm Right Forearm Left Upper Arm Wounding Event: Bite Bite Bite Primary Etiology: Trauma, Other Trauma, Other Trauma, Other Comorbid History: Congestive Heart Failure, Congestive Heart Failure, Congestive Heart Failure, Hypertension, Type II Diabetes Hypertension, Type II Diabetes Hypertension, Type II Diabetes Date Acquired: 10/26/2019 10/26/2019 10/26/2019 Weeks of Treatment: 0 0 0 Wound Status: Open Open Open Measurements L x W x D (cm) 1x1x0.1  15x2x0.1 0.8x0.8x0.1 Area (cm) : 0.785 23.562 0.503 Volume (cm) : 0.079 2.356 0.05 Classification: Full Thickness Without Exposed Full Thickness Without Exposed Full Thickness Without Exposed Support Structures Support Structures Support Structures Exudate Amount: None Present None Present None Present Exudate Type: N/A N/A N/A Exudate Color: N/A N/A N/A Wound Margin: Flat and Intact Flat and Intact Flat and Intact Granulation Amount: None Present (0%) None Present (0%) None Present (0%) Necrotic Amount: Large (67-100%) Large (67-100%) Large (67-100%) Necrotic Tissue: Eschar Eschar Eschar Exposed Structures: Fascia: No Fascia: No Fascia: No Fat Layer (Subcutaneous Tissue) Fat Layer (Subcutaneous Tissue) Fat Layer (Subcutaneous Tissue) Exposed: No Exposed: No Exposed: No Tendon: No Tendon: No Tendon: No Muscle: No Muscle: No Muscle: No Joint: No Joint: No Joint: No Bone: No Bone: No Bone: No Epithelialization: None None None Wound Number: 4 5 6  Photos: Wound Location: Left Forearm Left, Circumferential Lower Leg Left Knee Wounding Event: Bite Bite Bite Gergen, Ramon L. ([id number]) Primary Etiology: Trauma, Other Trauma, Other Diabetic  Wound/Ulcer of the Lower Extremity Comorbid History: Congestive Heart Failure, Congestive Heart Failure, Congestive Heart Failure, Hypertension, Type II Diabetes Hypertension, Type II Diabetes Hypertension, Type II Diabetes Date Acquired: 10/26/2019 10/26/2019 10/26/2019 Weeks of Treatment: 0 0 0 Wound Status: Open Open Open Measurements L x W x D (cm) 0.5x0.5x0.1 17x2x0.1 1.5x9x0.1 Area (cm) : 0.196 26.704 10.603 Volume (cm) : 0.02 2.67 1.06 Classification: Full Thickness Without Exposed Full Thickness Without Exposed Grade 2 Support Structures Support Structures Exudate Amount: None Present None Present Medium Exudate Type: N/A N/A Serosanguineous Exudate Color: N/A N/A red, brown Wound Margin: Flat and Intact Flat and Intact Flat and Intact Granulation Amount: None Present (0%) None Present (0%) None Present (0%) Necrotic Amount: Large (67-100%) Large (67-100%) Large (67-100%) Necrotic Tissue: Eschar Eschar Eschar Exposed Structures: Fascia: No Fascia: No N/A Fat Layer (Subcutaneous Tissue) Fat Layer (Subcutaneous Tissue) Exposed: No Exposed: No Tendon: No Tendon: No Muscle: No Muscle: No Joint: No Joint: No Bone: No Bone: No Epithelialization: None None None Wound Number: 7 N/A N/A Photos: N/A N/A Wound Location: Right, Circumferential Lower Leg N/A N/A Wounding Event: Bite N/A N/A Primary Etiology: Diabetic Wound/Ulcer of the Lower N/A N/A Extremity Comorbid History: Congestive Heart Failure, N/A N/A Hypertension, Type II Diabetes Date Acquired: 10/26/2019 N/A N/A Weeks of Treatment: 0 N/A N/A Wound Status: Open N/A N/A Measurements L x W x D (cm) 12x3x0.01 N/A N/A Area (cm) : 28.274 N/A N/A Volume (cm) : 0.283 N/A N/A Classification: Grade 2 N/A N/A Exudate Amount: None Present N/A N/A Exudate Type: N/A N/A N/A Exudate Color: N/A N/A N/A Wound Margin: Flat and Intact N/A N/A Granulation Amount: None Present (0%) N/A N/A Necrotic Amount: Large (67-100%) N/A  N/A Necrotic Tissue: Eschar N/A N/A Exposed Structures: Fascia: No N/A N/A Fat Layer (Subcutaneous Tissue) Exposed: No Tendon: No Muscle: No Joint: No Bone: No Epithelialization: None N/A N/A Treatment Notes Electronic Signature(s) Signed: 11/25/2019 4:41:04 PM By: Linton Ham MD [place], Massie Kluver ([id number]) Entered By: Linton Ham on 11/25/2019 10:15:49 Benak, Massie Kluver ([id number]) -------------------------------------------------------------------------------- Multi-Disciplinary Care Plan Details Patient Name: Erin Hayes, Martena L. Date of Service: 11/25/2019 9:15 AM Medical Record Number: [id number] Patient Account Number: [id number] Date of Birth/Sex: 03-15-1966 (54 y.o. F) Treating RN: Cornell Barman Primary Care Lulabelle Desta: MALFI, NICOLE Other Clinician: Referring Charnel Giles: Referral, Self Treating Nicasio Barlowe/Extender: Tito Dine in Treatment: 0 Active Inactive Electronic Signature(s) Signed: 12/08/2019 1:05:00 PM By: Gretta Cool, BSN, RN, CWS, Kim RN, BSN Previous Signature: 11/26/2019 6:25:29 PM  Version By: Gretta Cool, BSN, RN, CWS, Kim RN, BSN Entered By: Gretta Cool, BSN, RN, CWS, Kim on 12/08/2019 13:04:59 Erin Hayes, Massie Kluver ([id number]) -------------------------------------------------------------------------------- Pain Assessment Details Patient Name: Erin Hayes, Samhita L. Date of Service: 11/25/2019 9:15 AM Medical Record Number: [id number] Patient Account Number: [id number] Date of Birth/Sex: 1965/11/24 (54 y.o. F) Treating RN: Army Melia Primary Care Sincere Berlanga: MALFI, NICOLE Other Clinician: Referring Gussie Murton: Referral, Self Treating Tonia Avino/Extender: Ricard Dillon Weeks in Treatment: 0 Active Problems Location of Pain Severity and Description of Pain Patient Has Paino No Site Locations Pain Management and Medication Current Pain Management: Electronic Signature(s) Signed: 11/25/2019 4:24:58 PM By: Army Melia Entered By: Army Melia on 11/25/2019 09:20:21 [place],  Massie Kluver ([id number]) -------------------------------------------------------------------------------- Patient/Caregiver Education Details Patient Name: Windy Carina L. Date of Service: 11/25/2019 9:15 AM Medical Record Number: [id number] Patient Account Number: [id number] Date of Birth/Gender: 1965-12-01 (54 y.o. F) Treating RN: Cornell Barman Primary Care Physician: MALFI, NICOLE Other Clinician: Referring Physician: Referral, Self Treating Physician/Extender: Tito Dine in Treatment: 0 Education Assessment Education Provided To: Patient Education Topics Provided Wound/Skin Impairment: Handouts: Other: PAtient to follow up with PCP and dermatology Methods: Explain/Verbal Responses: State content correctly Electronic Signature(s) Signed: 11/26/2019 6:25:29 PM By: Gretta Cool, BSN, RN, CWS, Kim RN, BSN Entered By: Gretta Cool, BSN, RN, CWS, Kim on 11/25/2019 10:13:29 [name] ([id number]) -------------------------------------------------------------------------------- Wound Assessment Details Patient Name: Erin Hayes, Shakhia L. Date of Service: 11/25/2019 9:15 AM Medical Record Number: [id number] Patient Account Number: [id number] Date of Birth/Sex: 02/20/1966 (54 y.o. F) Treating RN: Army Melia Primary Care Lilibeth Opie: MALFI, NICOLE Other Clinician: Referring Arti Trang: Referral, Self Treating Shahzaib Azevedo/Extender: Ricard Dillon Weeks in Treatment: 0 Wound Status Wound Number: 1 Primary Etiology: Trauma, Other Wound Location: Right Upper Arm Wound Status: Open Wounding Event: Bite Comorbid Congestive Heart Failure, Hypertension, Type II History: Diabetes Date Acquired: 10/26/2019 Weeks Of Treatment: 0 Clustered Wound: No Photos Wound Measurements Length: (cm) 1 Width: (cm) 1 Depth: (cm) 0.1 Area: (cm) 0.785 Volume: (cm) 0.079 % Reduction in Area: % Reduction in Volume: Epithelialization: None Tunneling: No Undermining: No Wound Description Classification: Full  Thickness Without Exposed Support Structure Wound Margin: Flat and Intact Exudate Amount: None Present s Wound Bed Granulation Amount: None Present (0%) Exposed Structure Necrotic Amount: Large (67-100%) Fascia Exposed: No Necrotic Quality: Eschar Fat Layer (Subcutaneous Tissue) Exposed: No Tendon Exposed: No Muscle Exposed: No Joint Exposed: No Bone Exposed: No Electronic Signature(s) Signed: 11/25/2019 4:24:58 PM By: Army Melia Entered By: Army Melia on 11/25/2019 09:37:23 [place], [place] ([id number]) -------------------------------------------------------------------------------- Wound Assessment Details Patient Name: Erin Hayes, Marshae L. Date of Service: 11/25/2019 9:15 AM Medical Record Number: [id number] Patient Account Number: [id number] Date of Birth/Sex: [birth year] (54 y.o. F) Treating RN: Army Melia Primary Care Lya Holben: MALFI, NICOLE Other Clinician: Referring Phoenix Dresser: Referral, Self Treating Lilianah Buffin/Extender: Ricard Dillon Weeks in Treatment: 0 Wound Status Wound Number: 2 Primary Etiology: Trauma, Other Wound Location: Right Forearm Wound Status: Open Wounding Event: Bite Comorbid Congestive Heart Failure, Hypertension, Type II History: Diabetes Date Acquired: 10/26/2019 Weeks Of Treatment: 0 Clustered Wound: No Photos Wound Measurements Length: (cm) 15 Width: (cm) 2 Depth: (cm) 0.1 Area: (cm) 23.562 Volume: (cm) 2.356 % Reduction in Area: % Reduction in Volume: Epithelialization: None Tunneling: No Undermining: No Wound Description Classification: Full Thickness Without Exposed Support Structures Wound Margin: Flat and Intact Exudate Amount: None Present Foul Odor After Cleansing: No Slough/Fibrino No Wound Bed Granulation Amount: None Present (0%) Exposed Structure Necrotic Amount: Large (67-100%) Fascia Exposed: No Necrotic Quality: Eschar  Fat Layer (Subcutaneous Tissue) Exposed: No Tendon Exposed: No Muscle Exposed: No Joint  Exposed: No Bone Exposed: No Electronic Signature(s) Signed: 11/25/2019 4:24:58 PM By: Army Melia Entered By: Army Melia on 11/25/2019 09:38:50 [place], [place] ([id number]) -------------------------------------------------------------------------------- Wound Assessment Details Patient Name: Erin Hayes, Toshiba L. Date of Service: 11/25/2019 9:15 AM Medical Record Number: [id number] Patient Account Number: [id number] Date of Birth/Sex: [birth year] (54 y.o. F) Treating RN: Army Melia Primary Care Sabella Traore: MALFI, NICOLE Other Clinician: Referring Cyndy Braver: Referral, Self Treating Miria Cappelli/Extender: Ricard Dillon Weeks in Treatment: 0 Wound Status Wound Number: 3 Primary Etiology: Trauma, Other Wound Location: Left Upper Arm Wound Status: Open Wounding Event: Bite Comorbid Congestive Heart Failure, Hypertension, Type II History: Diabetes Date Acquired: 10/26/2019 Weeks Of Treatment: 0 Clustered Wound: No Photos Wound Measurements Length: (cm) 0.8 Width: (cm) 0.8 Depth: (cm) 0.1 Area: (cm) 0.503 Volume: (cm) 0.05 % Reduction in Area: % Reduction in Volume: Epithelialization: None Tunneling: No Undermining: No Wound Description Classification: Full Thickness Without Exposed Support Structures Wound Margin: Flat and Intact Exudate Amount: None Present Foul Odor After Cleansing: No Wound Bed Granulation Amount: None Present (0%) Exposed Structure Necrotic Amount: Large (67-100%) Fascia Exposed: No Necrotic Quality: Eschar Fat Layer (Subcutaneous Tissue) Exposed: No Tendon Exposed: No Muscle Exposed: No Joint Exposed: No Bone Exposed: No Electronic Signature(s) Signed: 11/25/2019 4:24:58 PM By: Army Melia Entered By: Army Melia on 11/25/2019 09:41:03 [place], [place] ([id number]) -------------------------------------------------------------------------------- Wound Assessment Details Patient Name: Erin Hayes, Kenzley L. Date of Service: 11/25/2019 9:15 AM Medical  Record Number: [id number] Patient Account Number: [id number] Date of Birth/Sex: 06-Oct-1965 (54 y.o. F) Treating RN: Army Melia Primary Care Dorotea Hand: MALFI, NICOLE Other Clinician: Referring Tacha Manni: Referral, Self Treating Reygan Heagle/Extender: Ricard Dillon Weeks in Treatment: 0 Wound Status Wound Number: 4 Primary Etiology: Trauma, Other Wound Location: Left Forearm Wound Status: Open Wounding Event: Bite Comorbid Congestive Heart Failure, Hypertension, Type II History: Diabetes Date Acquired: 10/26/2019 Weeks Of Treatment: 0 Clustered Wound: No Photos Wound Measurements Length: (cm) 0.5 Width: (cm) 0.5 Depth: (cm) 0.1 Area: (cm) 0.196 Volume: (cm) 0.02 % Reduction in Area: % Reduction in Volume: Epithelialization: None Tunneling: No Undermining: No Wound Description Classification: Full Thickness Without Exposed Support Structure Wound Margin: Flat and Intact Exudate Amount: None Present s Wound Bed Granulation Amount: None Present (0%) Exposed Structure Necrotic Amount: Large (67-100%) Fascia Exposed: No Necrotic Quality: Eschar Fat Layer (Subcutaneous Tissue) Exposed: No Tendon Exposed: No Muscle Exposed: No Joint Exposed: No Bone Exposed: No Electronic Signature(s) Signed: 11/25/2019 4:24:58 PM By: Army Melia Entered By: Army Melia on 11/25/2019 09:43:09 [place], [place] ([id number]) -------------------------------------------------------------------------------- Wound Assessment Details Patient Name: Erin Hayes, Jaedyn L. Date of Service: 11/25/2019 9:15 AM Medical Record Number: [id number] Patient Account Number: [id number] Date of Birth/Sex: 11-Jun-1965 (54 y.o. F) Treating RN: Army Melia Primary Care Dalilah Curlin: MALFI, NICOLE Other Clinician: Referring Ciclaly Mulcahey: Referral, Self Treating Tijuan Dantes/Extender: Ricard Dillon Weeks in Treatment: 0 Wound Status Wound Number: 5 Primary Etiology: Trauma, Other Wound Location: Left, Circumferential Lower  Leg Wound Status: Open Wounding Event: Bite Comorbid Congestive Heart Failure, Hypertension, Type II History: Diabetes Date Acquired: 10/26/2019 Weeks Of Treatment: 0 Clustered Wound: No Photos Wound Measurements Length: (cm) 17 Width: (cm) 2 Depth: (cm) 0.1 Area: (cm) 26.704 Volume: (cm) 2.67 % Reduction in Area: % Reduction in Volume: Epithelialization: None Tunneling: No Undermining: No Wound Description Classification: Full Thickness Without Exposed Support Structure Wound Margin: Flat and Intact Exudate Amount: None Present s Wound Bed Granulation Amount: None Present (0%) Exposed Structure  Necrotic Amount: Large (67-100%) Fascia Exposed: No Necrotic Quality: Eschar Fat Layer (Subcutaneous Tissue) Exposed: No Tendon Exposed: No Muscle Exposed: No Joint Exposed: No Bone Exposed: No Electronic Signature(s) Signed: 11/25/2019 4:24:58 PM By: Army Melia Entered By: Army Melia on 11/25/2019 09:44:31 [place], [place] ([id number]) -------------------------------------------------------------------------------- Wound Assessment Details Patient Name: Erin Hayes, Williette L. Date of Service: 11/25/2019 9:15 AM Medical Record Number: [id number] Patient Account Number: [id number] Date of Birth/Sex: 02/27/1966 (54 y.o. F) Treating RN: Army Melia Primary Care Maizy Davanzo: MALFI, NICOLE Other Clinician: Referring Tyleek Smick: Referral, Self Treating Morry Veiga/Extender: Ricard Dillon Weeks in Treatment: 0 Wound Status Wound Number: 6 Primary Etiology: Diabetic Wound/Ulcer of the Lower Extremity Wound Location: Left Knee Wound Status: Open Wounding Event: Bite Comorbid Congestive Heart Failure, Hypertension, Type II History: Diabetes Date Acquired: 10/26/2019 Weeks Of Treatment: 0 Clustered Wound: No Photos Wound Measurements Length: (cm) 1.5 Width: (cm) 9 Depth: (cm) 0.1 Area: (cm) 10.603 Volume: (cm) 1.06 % Reduction in Area: % Reduction in Volume: Epithelialization:  None Tunneling: No Undermining: No Wound Description Classification: Grade 2 Wound Margin: Flat and Intact Exudate Amount: Medium Exudate Type: Serosanguineous Exudate Color: red, brown Foul Odor After Cleansing: No Wound Bed Granulation Amount: None Present (0%) Necrotic Amount: Large (67-100%) Necrotic Quality: Eschar Electronic Signature(s) Signed: 11/25/2019 4:24:58 PM By: Army Melia Entered By: Army Melia on 11/25/2019 09:46:00 [place], Keierra L. ([id number]) -------------------------------------------------------------------------------- Wound Assessment Details Patient Name: Erin Hayes, Riana L. Date of Service: 11/25/2019 9:15 AM Medical Record Number: [id number] Patient Account Number: [id number] Date of Birth/Sex: 13-Oct-1965 (54 y.o. F) Treating RN: Army Melia Primary Care Fey Coghill: MALFI, NICOLE Other Clinician: Referring Kiani Wurtzel: Referral, Self Treating Mumin Denomme/Extender: Ricard Dillon Weeks in Treatment: 0 Wound Status Wound Number: 7 Primary Etiology: Diabetic Wound/Ulcer of the Lower Extremity Wound Location: Right, Circumferential Lower Leg Wound Status: Open Wounding Event: Bite Comorbid Congestive Heart Failure, Hypertension, Type II History: Diabetes Date Acquired: 10/26/2019 Weeks Of Treatment: 0 Clustered Wound: No Photos Wound Measurements Length: (cm) 12 Width: (cm) 3 Depth: (cm) 0.01 Area: (cm) 28.274 Volume: (cm) 0.283 % Reduction in Area: % Reduction in Volume: Epithelialization: None Tunneling: No Undermining: No Wound Description Classification: Grade 2 Wound Margin: Flat and Intact Exudate Amount: None Present Wound Bed Granulation Amount: None Present (0%) Exposed Structure Necrotic Amount: Large (67-100%) Fascia Exposed: No Necrotic Quality: Eschar Fat Layer (Subcutaneous Tissue) Exposed: No Tendon Exposed: No Muscle Exposed: No Joint Exposed: No Bone Exposed: No Electronic Signature(s) Signed: 11/25/2019 4:24:58 PM By:  Army Melia Entered By: Army Melia on 11/25/2019 09:47:00 Freas, Miryam L. ([id number]) -------------------------------------------------------------------------------- Vitals Details Patient Name: Erin Hayes, Dilynn L. Date of Service: 11/25/2019 9:15 AM Medical Record Number: [id number] Patient Account Number: [id number] Date of Birth/Sex: 01-11-1966 (54 y.o. F) Treating RN: Army Melia Primary Care Shalisha Clausing: MALFI, NICOLE Other Clinician: Referring Skylen Danielsen: Referral, Self Treating Dewitt Judice/Extender: Ricard Dillon Weeks in Treatment: 0 Vital Signs Time Taken: 09:20 Temperature (F): 98.2 Height (in): 59 Pulse (bpm): 91 Source: Stated Respiratory Rate (breaths/min): 16 Weight (lbs): 113 Blood Pressure (mmHg): 103/82 Source: Stated Reference Range: 80 - 120 mg / dl Body Mass Index (BMI): 22.8 Electronic Signature(s) Signed: 11/25/2019 4:24:58 PM By: Army Melia Entered By: Army Melia on 11/25/2019 09:20:51

## 2019-11-27 NOTE — Progress Notes (Signed)
LETRICIA, KRINSKY ([id number]) Visit Report for 11/25/2019 Chief Complaint Document Details Patient Name: Erin Hayes, Erin Hayes. Date of Service: 11/25/2019 9:15 AM Medical Record Number: [id number] Patient Account Number: [id number] Date of Birth/Sex: 30-May-1965 (54 y.o. F) Treating RN: Cornell Barman Primary Care Provider: MALFI, NICOLE Other Clinician: Referring Provider: Referral, Self Treating Provider/Extender: Tito Dine in Treatment: 0 Information Obtained from: Patient Chief Complaint 11/25/2019; patient is here for review of rash on the bilateral arms and anterior legs Electronic Signature(s) Signed: 11/25/2019 4:41:04 PM By: Linton Ham MD Entered By: Linton Ham on 11/25/2019 10:16:20 [place], [place] ([id number]) -------------------------------------------------------------------------------- HPI Details Patient Name: Erin Hayes, Erin L. Date of Service: 11/25/2019 9:15 AM Medical Record Number: [id number] Patient Account Number: [id number] Date of Birth/Sex: 11-Sep-1965 (54 y.o. F) Treating RN: Cornell Barman Primary Care Provider: MALFI, NICOLE Other Clinician: Referring Provider: Referral, Self Treating Provider/Extender: Ricard Dillon Weeks in Treatment: 0 History of Present Illness HPI Description: ADMISSION 11/25/2019 This is a 54 year old woman with type 2 diabetes. She says over the last month she is developed an itchy rash on the dorsal aspect of her forearms and her lower legs including her thighs. The rash is itchy. She is really not able to describe how this looks. She is convinced as is her housemate that she has been bitten by [place] cockroaches. She states they have had their home fumigated but the problem has not stopped. She denies scratching incessantly. I have looked through her lab work she does not have significant liver or kidney problems. Past medical history includes type 2 diabetes with neuropathy, heart failure with reduced ejection fraction  [nonischemic], capsulitis of the left foot with a left fourth metatarsal fracture currently being managed by Dr. Amalia Hailey of podiatry, hyperlipidemia and hypertension Electronic Signature(s) Signed: 11/25/2019 4:41:04 PM By: Linton Ham MD Entered By: Linton Ham on 11/25/2019 10:19:20 [place], Erin Hayes ([id number]) -------------------------------------------------------------------------------- Physical Exam Details Patient Name: Erin Hayes, Erin L. Date of Service: 11/25/2019 9:15 AM Medical Record Number: [id number] Patient Account Number: [id number] Date of Birth/Sex: 02-24-1966 (54 y.o. F) Treating RN: Cornell Barman Primary Care Provider: MALFI, NICOLE Other Clinician: Referring Provider: Referral, Self Treating Provider/Extender: Ricard Dillon Weeks in Treatment: 0 Constitutional Sitting or standing Blood Pressure is within target range for patient.. Pulse regular and within target range for patient.[name] Respirations regular, non- labored and within target range.. Temperature is normal and within the target range for the patient.[name] appears in no distress. Respiratory Respiratory effort is easy and symmetric bilaterally. Rate is normal at rest and on room air.. Cardiovascular Pedal pulses palpable and strong bilaterally.. Notes Wound exam; the patient has a widespread rash on her forearms upper arms dorsally and her anterior lower legs and thighs. These are dry scaly round areas some of them have ulcerated although this may be from scratching. This does not involve her groin abdomen or back Electronic Signature(s) Signed: 11/25/2019 4:41:04 PM By: Linton Ham MD Entered By: Linton Ham on 11/25/2019 10:20:32 Erin Hayes ([id number]) -------------------------------------------------------------------------------- Physician Orders Details Patient Name: Erin Hayes, Erin L. Date of Service: 11/25/2019 9:15 AM Medical Record Number: [id number] Patient Account Number:  [id number] Date of Birth/Sex: 10-16-1965 (54 y.o. F) Treating RN: Cornell Barman Primary Care Provider: MALFI, NICOLE Other Clinician: Referring Provider: Referral, Self Treating Provider/Extender: Tito Dine in Treatment: 0 Verbal / Phone Orders: No Diagnosis Coding Skin Barriers/Peri-Wound Care Wound #1 Right Upper Arm o Moisturizing lotion Wound #2 Right Forearm o Moisturizing lotion Wound #3 Left Upper Arm o Moisturizing  lotion Wound #4 Left Forearm o Moisturizing lotion Wound #5 Left,Circumferential Lower Leg o Moisturizing lotion Wound #6 Left Knee o Moisturizing lotion Wound #7 Right,Circumferential Lower Leg o Moisturizing lotion Discharge From [facility] Wound #1 Right Upper Arm o Discharge from [place]. Patient to follow-up with dermatology. Wound #2 Right Forearm o Discharge from [place]. Patient to follow-up with dermatology. Wound #3 Left Upper Arm o Discharge from [place]. Patient to follow-up with dermatology. Wound #4 Left Forearm o Discharge from [place]. Patient to follow-up with dermatology. Wound #5 Left,Circumferential Lower Leg o Discharge from [place]. Patient to follow-up with dermatology. Wound #6 Left Knee o Discharge from [place]. Patient to follow-up with dermatology. Wound #7 Right,Circumferential Lower Leg o Discharge from [place]. Patient to follow-up with dermatology. Electronic Signature(s) Signed: 11/25/2019 4:41:04 PM By: Linton Ham MD Signed: 11/26/2019 6:25:29 PM By: Gretta Cool, BSN, RN, CWS, Kim RN, BSN Entered By: Gretta Cool, BSN, RN, CWS, Kim on 11/25/2019 10:10:52 [place], Erin Hayes ([id number]) -------------------------------------------------------------------------------- Problem List Details Patient Name: Erin Hayes, Erin L. Date of Service: 11/25/2019 9:15  AM Medical Record Number: [id number] Patient Account Number: [id number] Date of Birth/Sex: 06-17-1965 (54 y.o. F) Treating RN: Cornell Barman Primary Care Provider: MALFI, NICOLE Other Clinician: Referring Provider: Referral, Self Treating Provider/Extender: Ricard Dillon Weeks in Treatment: 0 Active Problems ICD-10 Encounter Code Description Active Date MDM Diagnosis E11.622 Type 2 diabetes mellitus with other skin ulcer 11/25/2019 No Yes R21 Rash and other nonspecific skin eruption 11/25/2019 No Yes Inactive Problems Resolved Problems Electronic Signature(s) Signed: 11/25/2019 4:41:04 PM By: Linton Ham MD Entered By: Linton Ham on 11/25/2019 10:14:35 [place], Walaa L. ([id number]) -------------------------------------------------------------------------------- Progress Note Details Patient Name: Erin Hayes, Vy L. Date of Service: 11/25/2019 9:15 AM Medical Record Number: [id number] Patient Account Number: [id number] Date of Birth/Sex: January 01, 1966 (54 y.o. F) Treating RN: Cornell Barman Primary Care Provider: MALFI, NICOLE Other Clinician: Referring Provider: Referral, Self Treating Provider/Extender: Tito Dine in Treatment: 0 Subjective Chief Complaint Information obtained from Patient 11/25/2019; patient is here for review of rash on the bilateral arms and anterior legs History of Present Illness (HPI) ADMISSION 11/25/2019 This is a 54 year old woman with type 2 diabetes. She says over the last month she is developed an itchy rash on the dorsal aspect of her forearms and her lower legs including her thighs. The rash is itchy. She is really not able to describe how this looks. She is convinced as is her housemate that she has been bitten by [place] cockroaches. She states they have had their home fumigated but the problem has not stopped. She denies scratching incessantly. I have looked through her lab work she does not have significant liver or kidney problems. Past  medical history includes type 2 diabetes with neuropathy, heart failure with reduced ejection fraction [nonischemic], capsulitis of the left foot with a left fourth metatarsal fracture currently being managed by Dr. Amalia Hailey of podiatry, hyperlipidemia and hypertension Patient History Information obtained from Patient. Allergies No Known Allergies Family History Cancer - Mother, Hypertension - Father, No family history of Diabetes, Heart Disease, Hereditary Spherocytosis, Kidney Disease, Lung Disease, Seizures, Stroke, Thyroid Problems, Tuberculosis. Social History Never smoker, Marital Status - Single, Alcohol Use - Moderate, Drug Use - No History, Caffeine Use - Moderate. Medical History Eyes Denies history of Cataracts, Glaucoma, Optic Neuritis Ear/Nose/Mouth/Throat Denies history of Chronic sinus problems/congestion,  Middle ear problems Hematologic/Lymphatic Denies history of Anemia, Hemophilia, Human Immunodeficiency Virus, Lymphedema, Sickle Cell Disease Respiratory Denies history of Aspiration, Asthma, Chronic Obstructive Pulmonary Disease (COPD), Pneumothorax, Sleep Apnea, Tuberculosis Cardiovascular Patient has history of Congestive Heart Failure, Hypertension Denies history of Angina, Arrhythmia, Coronary Artery Disease, Deep Vein Thrombosis, Hypotension, Myocardial Infarction, Peripheral Arterial Disease, Peripheral Venous Disease, Phlebitis, Vasculitis Gastrointestinal Denies history of Cirrhosis , Colitis, Crohn s, Hepatitis A, Hepatitis B, Hepatitis C Endocrine Patient has history of Type II Diabetes - 3 years Genitourinary Denies history of End Stage Renal Disease Immunological Denies history of Lupus Erythematosus, Raynaud s, Scleroderma Integumentary (Skin) Denies history of History of Burn, History of pressure wounds Musculoskeletal Denies history of Gout, Rheumatoid Arthritis, Osteoarthritis, Osteomyelitis Neurologic Denies history of Dementia, Neuropathy,  Quadriplegia, Paraplegia, Seizure Disorder Oncologic Denies history of Received Chemotherapy, Received Radiation Psychiatric Denies history of Anorexia/bulimia, Confinement Anxiety Patient is treated with Insulin, Oral Agents. Blood sugar is tested. Erin Hayes, Erin Hayes ([id number]) Review of Systems (ROS) Constitutional Symptoms (General Health) Denies complaints or symptoms of Fatigue, Fever, Chills, Marked Weight Change. Eyes Denies complaints or symptoms of Dry Eyes, Vision Changes, Glasses / Contacts. Ear/Nose/Mouth/Throat Denies complaints or symptoms of Difficult clearing ears, Sinusitis. Hematologic/Lymphatic Denies complaints or symptoms of Bleeding / Clotting Disorders, Human Immunodeficiency Virus. Respiratory Denies complaints or symptoms of Chronic or frequent coughs, Shortness of Breath. Cardiovascular Denies complaints or symptoms of Chest pain, LE edema. Gastrointestinal Denies complaints or symptoms of Frequent diarrhea, Nausea, Vomiting. Endocrine Denies complaints or symptoms of Hepatitis, Thyroid disease, Polydypsia (Excessive Thirst). Genitourinary Denies complaints or symptoms of Kidney failure/ Dialysis, Incontinence/dribbling. Immunological Denies complaints or symptoms of Hives, Itching. Integumentary (Skin) Complains or has symptoms of Wounds. Denies complaints or symptoms of Bleeding or bruising tendency, Breakdown, Swelling. Musculoskeletal Denies complaints or symptoms of Muscle Pain, Muscle Weakness. Neurologic Denies complaints or symptoms of Numbness/parasthesias, Focal/Weakness. Psychiatric Denies complaints or symptoms of Anxiety, Claustrophobia. Objective Constitutional Sitting or standing Blood Pressure is within target range for patient.. Pulse regular and within target range for patient.[name] Respirations regular, non- labored and within target range.. Temperature is normal and within the target range for the patient.[name] appears in no  distress. Vitals Time Taken: 9:20 AM, Height: 59 in, Source: Stated, Weight: 113 lbs, Source: Stated, BMI: 22.8, Temperature: 98.2 F, Pulse: 91 bpm, Respiratory Rate: 16 breaths/min, Blood Pressure: 103/82 mmHg. Respiratory Respiratory effort is easy and symmetric bilaterally. Rate is normal at rest and on room air.. Cardiovascular Pedal pulses palpable and strong bilaterally.. General Notes: Wound exam; the patient has a widespread rash on her forearms upper arms dorsally and her anterior lower legs and thighs. These are dry scaly round areas some of them have ulcerated although this may be from scratching. This does not involve her groin abdomen or back Integumentary (Hair, Skin) Wound #1 status is Open. Original cause of wound was Bite. The wound is located on the Right Upper Arm. The wound measures 1cm length x 1cm width x 0.1cm depth; 0.785cm^2 area and 0.079cm^3 volume. There is no tunneling or undermining noted. There is a none present amount of drainage noted. The wound margin is flat and intact. There is no granulation within the wound bed. There is a large (67-100%) amount of necrotic tissue within the wound bed including Eschar. Wound #2 status is Open. Original cause of wound was Bite. The wound is located on the Right Forearm. The wound measures 15cm length x 2cm width x 0.1cm depth; 23.562cm^2 area and 2.356cm^3 volume. There  is no tunneling or undermining noted. There is a none present amount of drainage noted. The wound margin is flat and intact. There is no granulation within the wound bed. There is a large (67-100%) amount of necrotic tissue within the wound bed including Eschar. Wound #3 status is Open. Original cause of wound was Bite. The wound is located on the Left Upper Arm. The wound measures 0.8cm length x 0.8cm width x 0.1cm depth; 0.503cm^2 area and 0.05cm^3 volume. There is no tunneling or undermining noted. There is a none present amount of drainage noted. The  wound margin is flat and intact. There is no granulation within the wound bed. There is a large (67-100%) amount of necrotic tissue within the wound bed including Eschar. Wound #4 status is Open. Original cause of wound was Bite. The wound is located on the Left Forearm. The wound measures 0.5cm length x Erin Hayes, Erin L. ([id number]) 0.5cm width x 0.1cm depth; 0.196cm^2 area and 0.02cm^3 volume. There is no tunneling or undermining noted. There is a none present amount of drainage noted. The wound margin is flat and intact. There is no granulation within the wound bed. There is a large (67-100%) amount of necrotic tissue within the wound bed including Eschar. Wound #5 status is Open. Original cause of wound was Bite. The wound is located on the Left,Circumferential Lower Leg. The wound measures 17cm length x 2cm width x 0.1cm depth; 26.704cm^2 area and 2.67cm^3 volume. There is no tunneling or undermining noted. There is a none present amount of drainage noted. The wound margin is flat and intact. There is no granulation within the wound bed. There is a large (67-100%) amount of necrotic tissue within the wound bed including Eschar. Wound #6 status is Open. Original cause of wound was Bite. The wound is located on the Left Knee. The wound measures 1.5cm length x 9cm width x 0.1cm depth; 10.603cm^2 area and 1.06cm^3 volume. There is no tunneling or undermining noted. There is a medium amount of serosanguineous drainage noted. The wound margin is flat and intact. There is no granulation within the wound bed. There is a large (67-100%) amount of necrotic tissue within the wound bed including Eschar. Wound #7 status is Open. Original cause of wound was Bite. The wound is located on the Right,Circumferential Lower Leg. The wound measures 12cm length x 3cm width x 0.01cm depth; 28.274cm^2 area and 0.283cm^3 volume. There is no tunneling or undermining noted. There is a none present amount of drainage  noted. The wound margin is flat and intact. There is no granulation within the wound bed. There is a large (67- 100%) amount of necrotic tissue within the wound bed including Eschar. Assessment Active Problems ICD-10 Type 2 diabetes mellitus with other skin ulcer Rash and other nonspecific skin eruption Plan Skin Barriers/Peri-Wound Care: Wound #1 Right Upper Arm: Moisturizing lotion Wound #2 Right Forearm: Moisturizing lotion Wound #3 Left Upper Arm: Moisturizing lotion Wound #4 Left Forearm: Moisturizing lotion Wound #5 Left,Circumferential Lower Leg: Moisturizing lotion Wound #6 Left Knee: Moisturizing lotion Wound #7 Right,Circumferential Lower Leg: Moisturizing lotion Discharge From [facility] Services: Wound #1 Right Upper Arm: Discharge from [place]. Patient to follow-up with dermatology. Wound #2 Right Forearm: Discharge from [place]. Patient to follow-up with dermatology. Wound #3 Left Upper Arm: Discharge from [place]. Patient to follow-up with dermatology. Wound #4 Left Forearm: Discharge from [place]. Patient to follow-up with dermatology. Wound #5  Left,Circumferential Lower Leg: Discharge from [place]. Patient to follow-up with dermatology. Wound #6 Left Knee: Discharge from [place]. Patient to follow-up with dermatology. Wound #7 Right,Circumferential Lower Leg: Discharge from [place]. Patient to follow-up with dermatology. 1. I am not really sure what this is. She is convinced she has some sort of infestation in her home. These may be bedbugs I am not familiar with [place] cockroaches although I have heard patients talk about this before. 2. Her skin is dry and somewhat irritated and dry looking. I have suggested a moisturizer. She sees her primary doctor tomorrow and I have asked them to consider sending her to dermatology 3. I  think the patient needs to see a dermatologist. I do not think this is related to her diabetes. Erin Hayes, Erin Hayes ([id number]) Electronic Signature(s) Signed: 11/25/2019 4:41:04 PM By: Linton Ham MD Entered By: Linton Ham on 11/25/2019 10:23:58 Erin Hayes ([id number]) -------------------------------------------------------------------------------- ROS/PFSH Details Patient Name: Erin Carina L. Date of Service: 11/25/2019 9:15 AM Medical Record Number: [id number] Patient Account Number: [id number] Date of Birth/Sex: 23-Nov-1965 (54 y.o. F) Treating RN: Army Melia Primary Care Provider: MALFI, NICOLE Other Clinician: Referring Provider: Referral, Self Treating Provider/Extender: Ricard Dillon Weeks in Treatment: 0 Information Obtained From Patient Constitutional Symptoms (General Health) Complaints and Symptoms: Negative for: Fatigue; Fever; Chills; Marked Weight Change Eyes Complaints and Symptoms: Negative for: Dry Eyes; Vision Changes; Glasses / Contacts Medical History: Negative for: Cataracts; Glaucoma; Optic Neuritis Ear/Nose/Mouth/Throat Complaints and Symptoms: Negative for: Difficult clearing ears; Sinusitis Medical History: Negative for: Chronic sinus problems/congestion; Middle ear problems Hematologic/Lymphatic Complaints and Symptoms: Negative for: Bleeding / Clotting Disorders; Human Immunodeficiency Virus Medical History: Negative for: Anemia; Hemophilia; Human Immunodeficiency Virus; Lymphedema; Sickle Cell Disease Respiratory Complaints and Symptoms: Negative for: Chronic or frequent coughs; Shortness of Breath Medical History: Negative for: Aspiration; Asthma; Chronic Obstructive Pulmonary Disease (COPD); Pneumothorax; Sleep Apnea; Tuberculosis Cardiovascular Complaints and Symptoms: Negative for: Chest pain; LE edema Medical History: Positive for: Congestive Heart Failure; Hypertension Negative for: Angina; Arrhythmia; Coronary Artery  Disease; Deep Vein Thrombosis; Hypotension; Myocardial Infarction; Peripheral Arterial Disease; Peripheral Venous Disease; Phlebitis; Vasculitis Gastrointestinal Complaints and Symptoms: Negative for: Frequent diarrhea; Nausea; Vomiting Medical History: Negative for: Cirrhosis ; Colitis; Crohnos; Hepatitis A; Hepatitis B; Hepatitis C Endocrine Erin Hayes, Erin L. ([id number]) Complaints and Symptoms: Negative for: Hepatitis; Thyroid disease; Polydypsia (Excessive Thirst) Medical History: Positive for: Type II Diabetes - 3 years Time with diabetes: 3 years Treated with: Insulin, Oral agents Blood sugar tested every day: Yes Tested : Genitourinary Complaints and Symptoms: Negative for: Kidney failure/ Dialysis; Incontinence/dribbling Medical History: Negative for: End Stage Renal Disease Immunological Complaints and Symptoms: Negative for: Hives; Itching Medical History: Negative for: Lupus Erythematosus; Raynaudos; Scleroderma Integumentary (Skin) Complaints and Symptoms: Positive for: Wounds Negative for: Bleeding or bruising tendency; Breakdown; Swelling Medical History: Negative for: History of Burn; History of pressure wounds Musculoskeletal Complaints and Symptoms: Negative for: Muscle Pain; Muscle Weakness Medical History: Negative for: Gout; Rheumatoid Arthritis; Osteoarthritis; Osteomyelitis Neurologic Complaints and Symptoms: Negative for: Numbness/parasthesias; Focal/Weakness Medical History: Negative for: Dementia; Neuropathy; Quadriplegia; Paraplegia; Seizure Disorder Psychiatric Complaints and Symptoms: Negative for: Anxiety; Claustrophobia Medical History: Negative for: Anorexia/bulimia; Confinement Anxiety Oncologic Medical History: Negative for: Received Chemotherapy; Received Radiation Immunizations Pneumococcal Vaccine: Received Pneumococcal Vaccination: No Implantable Devices None Erin Hayes, Erin L. ([id number]) Family and Social History Cancer:  Yes - Mother; Diabetes: No; Heart Disease: No; Hereditary Spherocytosis: No; Hypertension: Yes - Father;  Kidney Disease: No; Lung Disease: No; Seizures: No; Stroke: No; Thyroid Problems: No; Tuberculosis: No; Never smoker; Marital Status - Single; Alcohol Use: Moderate; Drug Use: No History; Caffeine Use: Moderate; Financial Concerns: No; Food, Clothing or Shelter Needs: No; Support System Lacking: No; Transportation Concerns: No Electronic Signature(s) Signed: 11/25/2019 4:24:58 PM By: Army Melia Signed: 11/25/2019 4:41:04 PM By: Linton Ham MD Entered By: Army Melia on 11/25/2019 09:26:42 Erin Hayes, Erin Hayes ([id number]) -------------------------------------------------------------------------------- SuperBill Details Patient Name: Erin Hayes, Priscilla L. Date of Service: 11/25/2019 Medical Record Number: [id number] Patient Account Number: [id number] Date of Birth/Sex: Aug 30, 1965 (54 y.o. F) Treating RN: Cornell Barman Primary Care Provider: MALFI, NICOLE Other Clinician: Referring Provider: Referral, Self Treating Provider/Extender: Ricard Dillon Weeks in Treatment: 0 Diagnosis Coding ICD-10 Codes Code Description E11.622 Type 2 diabetes mellitus with other skin ulcer R21 Rash and other nonspecific skin eruption Facility Procedures CPT4 Code: [id number] Description: [phone] - WOUND CARE VISIT-LEV 5 EST PT Modifier: Quantity: 1 Physician Procedures CPT4 Code: [id number] Description: 20100 - WC PHYS LEVEL 2 - NEW PT Modifier: Quantity: 1 CPT4 Code: Description: ICD-10 Diagnosis Description E11.622 Type 2 diabetes mellitus with other skin ulcer R21 Rash and other nonspecific skin eruption Modifier: Quantity: Electronic Signature(s) Signed: 11/25/2019 4:41:04 PM By: Linton Ham MD Entered By: Linton Ham on 11/25/2019 10:24:31

## 2019-12-08 ENCOUNTER — Ambulatory Visit: Payer: Medicaid Other | Admitting: Physician Assistant

## 2019-12-16 ENCOUNTER — Telehealth: Payer: Self-pay

## 2019-12-16 NOTE — Telephone Encounter (Signed)
-----   Message from Verl Bangs, FNP sent at 12/15/2019  1:23 PM EDT ----- Regarding: Incontinence Supplies I received a request for incontinence supplies, but isn't anything we have gone over in clinic.  Can we have her scheduled for an in office visit for this?  (Want to run urine testing same day).

## 2019-12-16 NOTE — Telephone Encounter (Signed)
I spoke with the patient and informed her per Erin Hayes that we have not never address her fecal incontinence. I informed her that this should be taking care of by her Gastroenterologist since they are addressing that issue.

## 2020-01-01 ENCOUNTER — Ambulatory Visit: Payer: MEDICAID | Admitting: Podiatry

## 2020-01-12 ENCOUNTER — Ambulatory Visit: Payer: MEDICAID | Admitting: Podiatry

## 2020-01-12 ENCOUNTER — Ambulatory Visit (INDEPENDENT_AMBULATORY_CARE_PROVIDER_SITE_OTHER): Payer: MEDICAID

## 2020-01-12 ENCOUNTER — Other Ambulatory Visit: Payer: Self-pay

## 2020-01-12 DIAGNOSIS — M19072 Primary osteoarthritis, left ankle and foot: Secondary | ICD-10-CM

## 2020-01-12 DIAGNOSIS — M79676 Pain in unspecified toe(s): Secondary | ICD-10-CM

## 2020-01-12 DIAGNOSIS — M778 Other enthesopathies, not elsewhere classified: Secondary | ICD-10-CM

## 2020-01-12 MED ORDER — MELOXICAM 15 MG PO TABS: 15.0000 mg | ORAL_TABLET | Freq: Every day | ORAL | 1 refills | Status: DC

## 2020-01-12 NOTE — Progress Notes (Signed)
   HPI: 54 y.o. female presenting today for follow up evaluation of bilateral foot pain.  Patient states overall she is doing much better.  She does have some residual pain to the left forefoot.  She has been wearing good supportive sneakers.  She does state that the meloxicam she was taking in the past helped significantly.  She presents for further treatment and evaluation   Past Medical History:  Diagnosis Date  . Carpal tunnel syndrome of right wrist   . Chronic combined systolic (congestive) and diastolic (congestive) heart failure ([place])    a. 03/2017 Echo: EF 20-25%, diff HK, Gr2 DD; b. 09/2017 Echo: EF 20-25%, diff HK, Gr2 DD.  . Diabetes ([place])   . GERD (gastroesophageal reflux disease)   . HLD (hyperlipidemia)   . Hypertension   . NICM (nonischemic cardiomyopathy) ([place])    a. 03/2017 Echo: EF 20-25%, diff HK, Gr2 DD, mild to mod MR, nl RV fxn;  b. 03/2017 Cath: mild nonobs dzs, EF 25%; c. 09/2017 Echo: EF 20-25%, diff HK. Gr2 DD. Mild MR. Mildly reduced RV fxn.  . Pleural effusion, left    a. 03/2017 s/p thoracentesis-->300 ml withdrawn--transudative.  . Pneumonia 02/19/2017     Physical Exam: General: The patient is alert and oriented x3 in no acute distress.  Dermatology: Skin is warm, dry and supple bilateral lower extremities. Negative for open lesions or macerations.  Vascular: Palpable pedal pulses bilaterally. No edema or erythema noted. Capillary refill within normal limits.  Neurological: Epicritic and protective threshold grossly intact bilaterally.   Musculoskeletal Exam: Pain with palpation noted to the lateral left midfoot. Range of motion within normal limits to all pedal and ankle joints bilateral. Muscle strength 5/5 in all groups bilateral.   Radiographic exam: Diffuse radiolucency noted throughout the pedal structures of the foot.  There is some DJD noted through the midtarsal joints as well.  Assessment: 1.  Chronic DJD left foot with osteopenia 2.   Capsulitis left foot  Plan of Care:  1. Patient evaluated.  2.  Refill prescription for meloxicam 15 mg daily as needed 3.  Continue wearing good supportive sneakers 4.  Return to clinic as needed   Edrick Kins, DPM [facility]  Dr. Edrick Kins, DPM    [address], [place] [address]                Office [phone]  Fax [phone]

## 2020-02-12 ENCOUNTER — Other Ambulatory Visit: Payer: Self-pay | Admitting: Family Medicine

## 2020-02-12 ENCOUNTER — Telehealth: Payer: Self-pay | Admitting: Cardiovascular Disease

## 2020-02-12 ENCOUNTER — Other Ambulatory Visit: Payer: Self-pay | Admitting: *Deleted

## 2020-02-12 ENCOUNTER — Telehealth: Payer: Self-pay | Admitting: *Deleted

## 2020-02-12 DIAGNOSIS — I10 Essential (primary) hypertension: Secondary | ICD-10-CM

## 2020-02-12 MED ORDER — SACUBITRIL-VALSARTAN 49-51 MG PO TABS: 1.0000 | ORAL_TABLET | Freq: Two times a day (BID) | ORAL | 3 refills | Status: DC

## 2020-02-12 MED ORDER — SACUBITRIL-VALSARTAN 49-51 MG PO TABS: 1.0000 | ORAL_TABLET | Freq: Two times a day (BID) | ORAL | 0 refills | Status: DC

## 2020-02-12 NOTE — Telephone Encounter (Signed)
Patient calling the office for samples of medication:   1.  What medication and dosage are you requesting samples for? Entresto 49-51 mg po BID  2.  Are you currently out of this medication?  yes

## 2020-02-12 NOTE — Telephone Encounter (Signed)
Pt mentioned she is currently out of Entresto 49/51 mg tablet. Pt is requesting samples mentioned that pharmacy mentioned that there was some type of issue with her getting a refill and she hasn't been able to get it refilled. Pt mentioned a approval but there isn't any information in covermymeds or faxed request for PA.  Medication was last sent in by her PCP. She is aware that I will contact pharmacy to see if we can see what's going on.

## 2020-02-12 NOTE — Telephone Encounter (Signed)
I spoke with pharmacy and she mentioned that no PA is required at this time and that what she can see is that pt is needing refills for her medication. The last time she got her medication refilled was 11/2019. I have sent in 30 day supply for Entresto 49-51 mg tablet for 30 days because her insurance will only pay for 30 days at a time.  Pt is aware that she is able to contact pharmacy and have medication filled at this time due to new Rx being sent in and if she has any issues she will contact the office.

## 2020-02-12 NOTE — Telephone Encounter (Signed)
Spoke with pt she mentioned pharmacy would not fill medication due to PA needed. They will forwarded a PA to our office. Once PA has been received we will complete and submit PA for approval.  Pt is aware that I have placed samples upfront for pick up.

## 2020-02-12 NOTE — Telephone Encounter (Signed)
Pt requiring PA for Entresto 49-51 mg tablet.  PA has been submitted through covermymeds. Awaiting Approval.

## 2020-02-12 NOTE — Telephone Encounter (Signed)
1 Box Entresto 49-51 mg.  Lot#[id number]       Exp. Date: 5/23

## 2020-02-16 LAB — HM DIABETES EYE EXAM

## 2020-02-25 ENCOUNTER — Encounter: Payer: Self-pay | Admitting: Podiatry

## 2020-02-25 ENCOUNTER — Ambulatory Visit: Payer: Medicaid Other | Admitting: Podiatry

## 2020-02-25 ENCOUNTER — Other Ambulatory Visit: Payer: Self-pay

## 2020-02-25 DIAGNOSIS — M79674 Pain in right toe(s): Secondary | ICD-10-CM

## 2020-02-25 DIAGNOSIS — E1165 Type 2 diabetes mellitus with hyperglycemia: Secondary | ICD-10-CM

## 2020-02-25 DIAGNOSIS — B351 Tinea unguium: Secondary | ICD-10-CM | POA: Insufficient documentation

## 2020-02-25 DIAGNOSIS — M79675 Pain in left toe(s): Secondary | ICD-10-CM | POA: Diagnosis not present

## 2020-02-25 NOTE — Progress Notes (Signed)
This patient returns to my office for at risk foot care.  This patient requires this care by a professional since this patient will be at risk due to having diabetes with angiopathy.  This patient is unable to cut nails herself since the patient cannot reach her nails.These nails are painful walking and wearing shoes.  This patient presents for at risk foot care today.  General Appearance  Alert, conversant and in no acute stress.  Vascular  Dorsalis pedis and posterior tibial  pulses are weakly  palpable  bilaterally.  Capillary return is within normal limits  bilaterally. Temperature is within normal limits  bilaterally.  Neurologic  Senn-Weinstein monofilament wire test within normal limits  bilaterally. Muscle power within normal limits bilaterally.  Nails Thick disfigured discolored nails with subungual debris  from hallux to fifth toes bilaterally. No evidence of bacterial infection or drainage bilaterally.  Orthopedic  No limitations of motion  feet .  No crepitus or effusions noted.  No bony pathology or digital deformities noted. Swelling over left forefoot due to previous bone fracture.  Skin  normotropic skin with no porokeratosis noted bilaterally.  No signs of infections or ulcers noted.     Onychomycosis  Pain in right toes  Pain in left toes  Consent was obtained for treatment procedures.   Mechanical debridement of nails 1-5  bilaterally performed with a nail nipper.  Filed with dremel without incident.    Return office visit    3 months                  Told patient to return for periodic foot care and evaluation due to potential at risk complications.   Gardiner Barefoot DPM

## 2020-03-01 ENCOUNTER — Other Ambulatory Visit: Payer: Self-pay

## 2020-03-01 ENCOUNTER — Encounter: Payer: Self-pay | Admitting: Family Medicine

## 2020-03-01 ENCOUNTER — Ambulatory Visit (INDEPENDENT_AMBULATORY_CARE_PROVIDER_SITE_OTHER): Payer: Medicaid Other | Admitting: Family Medicine

## 2020-03-01 VITALS — BP 113/84 | HR 92 | Temp 98.4°F | Resp 18 | Ht 59.0 in | Wt 111.8 lb

## 2020-03-01 DIAGNOSIS — E1165 Type 2 diabetes mellitus with hyperglycemia: Secondary | ICD-10-CM

## 2020-03-01 DIAGNOSIS — I10 Essential (primary) hypertension: Secondary | ICD-10-CM | POA: Diagnosis not present

## 2020-03-01 DIAGNOSIS — E7800 Pure hypercholesterolemia, unspecified: Secondary | ICD-10-CM

## 2020-03-01 DIAGNOSIS — R635 Abnormal weight gain: Secondary | ICD-10-CM

## 2020-03-01 LAB — POCT GLYCOSYLATED HEMOGLOBIN (HGB A1C): Hemoglobin A1C: 7.3 % — AB (ref 4.0–5.6)

## 2020-03-01 NOTE — Progress Notes (Signed)
Subjective:    Patient ID: Erin Hayes, female    DOB: April 01, 1966, 54 y.o.   MRN: [id number]  Erin Hayes is a 54 y.o. female presenting on 03/01/2020 for Diabetes and Hypertension   HPI   Hypertension - She is not checking BP at home or outside of clinic.    - Current medications: carvedilol 12.5mg  BID, sacubitril-valsartan 49-51 BID, tolerating well without side effects - She is not currently symptomatic. - Pt denies headache, lightheadedness, dizziness, changes in vision, chest tightness/pressure, palpitations, leg swelling, sudden loss of speech or loss of consciousness. - She  reports no regular exercise routine. - Her diet is high in salt, high in fat, and high in carbohydrates.  Diabetes Pt presents today for follow up Type 2 Diabetes Mellitus.  He/she (caps): She ACTION; IS/IS NOT: is checking AM CBG at home with range of around 110-120's and sometimes lower than that, reports no hypoglycemia. -Current diabetic medications include: metformin 500mg  BID WC, lantus 14 units at bedtime, and bydureon 2mg  weekly -ACTION; IS/IS NOT: is not currently symptomatic -Actions; denies/reports/admits to: denies polydipsia, polyphagia, polyuria, headaches, diaphoresis, shakiness, chills, pain, numbness or tingling in extremities or changes in vision -Clinical course has been improving  -Reports no structured exercise routine -Diet is high in salt, high in fat, and high in carbohydrates  PREVENTION Eye exam current (within 1 year) Up to date Foot exam current (within 1 year) Up to date Lipid/ASCVD risk reduction - on statin: YES/NO: Yes  Kidney Protection (On ACE/ARB)? YES/NO: Yes   Depression screen [facility] 2/9 05/18/2019 03/04/2018 09/03/2017  Decreased Interest 0 0 0  Down, Depressed, Hopeless 0 0 0  PHQ - 2 Score 0 0 0    Social History   Tobacco Use  . Smoking status: Never Smoker  . Smokeless tobacco: Never Used  Vaping Use  . Vaping Use: Never used  Substance Use Topics   . Alcohol use: No  . Drug use: No    Review of Systems  Constitutional: Negative.   HENT: Negative.   Eyes: Negative.   Respiratory: Negative.   Cardiovascular: Negative.   Gastrointestinal: Negative.   Endocrine: Negative.   Genitourinary: Negative.   Musculoskeletal: Negative.   Skin: Negative.   Allergic/Immunologic: Negative.   Neurological: Negative.   Hematological: Negative.   Psychiatric/Behavioral: Negative.    Per HPI unless specifically indicated above     Objective:    BP 113/84 (BP Location: Right Arm, Patient Position: Sitting, Cuff Size: Normal)   Pulse 92   Temp 98.4 F (36.9 C) (Oral)   Resp 18   Ht 4\' 11"  (1.499 m)   Wt 111 lb 12.8 oz (50.7 kg)   SpO2 100%   BMI 22.58 kg/m   Wt Readings from Last 3 Encounters:  03/01/20 111 lb 12.8 oz (50.7 kg)  11/26/19 113 lb (51.3 kg)  08/20/19 120 lb 9.6 oz (54.7 kg)    Physical Exam Vitals and nursing note reviewed.  Constitutional:      General: She is not in acute distress.    Appearance: Normal appearance. She is well-developed and well-groomed. She is not ill-appearing or toxic-appearing.  HENT:     Head: Normocephalic and atraumatic.     Nose:     Comments: Lizbeth Bark is in place, covering mouth and nose. Eyes:     General: Lids are normal. Vision grossly intact.        Right eye: No discharge.        Left  eye: No discharge.     Extraocular Movements: Extraocular movements intact.     Conjunctiva/sclera: Conjunctivae normal.     Pupils: Pupils are equal, round, and reactive to light.  Cardiovascular:     Rate and Rhythm: Normal rate and regular rhythm.     Pulses: Normal pulses.          Dorsalis pedis pulses are 2+ on the right side and 2+ on the left side.     Heart sounds: Normal heart sounds. No murmur heard.  No friction rub. No gallop.   Pulmonary:     Effort: Pulmonary effort is normal. No respiratory distress.     Breath sounds: Normal breath sounds.  Musculoskeletal:     Right  lower leg: No edema.     Left lower leg: No edema.  Skin:    General: Skin is warm and dry.     Capillary Refill: Capillary refill takes less than 2 seconds.  Neurological:     General: No focal deficit present.     Mental Status: She is alert and oriented to person, place, and time.  Psychiatric:        Attention and Perception: Attention and perception normal.        Mood and Affect: Mood and affect normal.        Speech: Speech normal.        Behavior: Behavior normal. Behavior is cooperative.        Thought Content: Thought content normal.        Cognition and Memory: Cognition and memory normal.        Judgment: Judgment normal.    Results for orders placed or performed in visit on 03/01/20  POCT glycosylated hemoglobin (Hb A1C)  Result Value Ref Range   Hemoglobin A1C 7.3 (A) 4.0 - 5.6 %   HbA1c POC (<> result, manual entry)     HbA1c, POC (prediabetic range)     HbA1c, POC (controlled diabetic range)        Assessment & Plan:   Problem List Items Addressed This Visit      Cardiovascular and Mediastinum   Hypertension    Controlled hypertension.  BP at goal < 130/80.  Pt is working on lifestyle modifications.  Taking medications tolerating well without side effects.  Complications: T7DU, GERD, CHF, HLD  Plan: 1. Continue taking carvedilol 12.5mg  BID, sacubitril-valsartan 49-51 BID 2. Obtain labs in the next 1-2 weeks  3. Encouraged heart healthy diet and increasing exercise to 30 minutes most days of the week, going no more than 2 days in a row without exercise. 4. Check BP 1-2 x per week at home, keep log, and bring to clinic at next appointment. 5. Follow up 3 months.       Relevant Orders   CBC with Differential   COMPLETE METABOLIC PANEL WITH GFR     Endocrine   Uncontrolled type 2 diabetes mellitus with hyperglycemia ([place]) - Primary    ControlledDM with A1c 7.3% improved from 8.3% on 11/26/2019 and goal A1c < 7.0%. - Complications - hyperglycemia  Plan:   1. Continue current therapy: metformin 500mg  BID WC, lantus 14 units at bedtime, and bydureon 2mg  weekly 2. Encourage improved lifestyle: - low carb/low glycemic diet reinforced prior education - Increase physical activity to 30 minutes most days of the week.  Explained that increased physical activity increases body's use of sugar for energy. 3. Check fasting am CBG and log these.  Bring log to next visit  for review 4. Continue ASA, ACEi and Statin 5. Up to date on DM eye and foot exams 6. Follow-up 3 months       Relevant Orders   POCT glycosylated hemoglobin (Hb A1C) (Completed)   POCT Urinalysis Dipstick   CBC with Differential   COMPLETE METABOLIC PANEL WITH GFR     Other   Hypercholesterolemia    Status unknown.  Recheck labs.  Followup after labs.       Relevant Orders   Lipid Profile    Other Visit Diagnoses    Weight gain       Relevant Orders   TSH + free T4      No orders of the defined types were placed in this encounter.   Follow up plan: Return in about 3 months (around 06/01/2020) for T2DM, HTN F/U.   Harlin Rain, [place] [facility] Nurse Practitioner [facility] 03/01/2020, 4:58 PM

## 2020-03-01 NOTE — Patient Instructions (Signed)
Continue all medications as directed.  Have your labs drawn in the next 1-2 weeks and we will contact you with the results.  You can learn more information online about your diabetes at American Diabetes Association: http://www.diabetes.org/ - General self-care (diet, medications, blood sugar checks). - Diet recommendations - There are even recipes available for you to look at and try.  Try to get exercise a minimum of 30 minutes per day at least 5 days per week as well as  adequate water intake all while measuring blood pressure a few times per week.  Keep a blood pressure log and bring back to clinic at your next visit.  If your readings are consistently over 130/80 to contact our office/send me a MyChart message and we will see you sooner.  Can try DASH and Mediterranean diet options, avoiding processed foods, lowering sodium intake, avoiding pork products, and eating a plant based diet for optimal health.  We will plan to see you back in 3 months for diabetes and hypertension follow up visit  You will receive a survey after today's visit either digitally by e-mail or paper by [place] mail. Your experiences and feedback matter to [place].  Please respond so we know how we are doing as we provide care for you.  Call us with any questions/concerns/needs.  It is my goal to be available to you for your health concerns.  Thanks for choosing me to be a partner in your healthcare needs!  Harlin Rain, FNP-C Family Nurse Practitioner [facility] Phone: [phone]

## 2020-03-01 NOTE — Assessment & Plan Note (Signed)
Status unknown.  Recheck labs.  Followup after labs.  

## 2020-03-01 NOTE — Assessment & Plan Note (Addendum)
ControlledDM with A1c 7.3% improved from 8.3% on 11/26/2019 and goal A1c < 7.0%. - Complications - hyperglycemia  Plan:  1. Continue current therapy: metformin 500mg  BID WC, lantus 14 units at bedtime, and bydureon 2mg  weekly 2. Encourage improved lifestyle: - low carb/low glycemic diet reinforced prior education - Increase physical activity to 30 minutes most days of the week.  Explained that increased physical activity increases body's use of sugar for energy. 3. Check fasting am CBG and log these.  Bring log to next visit for review 4. Continue ASA, ACEi and Statin 5. Up to date on DM eye and foot exams 6. Follow-up 3 months

## 2020-03-01 NOTE — Assessment & Plan Note (Signed)
Controlled hypertension.  BP at goal < 130/80.  Pt is working on lifestyle modifications.  Taking medications tolerating well without side effects.  Complications: I3HW, GERD, CHF, HLD  Plan: 1. Continue taking carvedilol 12.5mg  BID, sacubitril-valsartan 49-51 BID 2. Obtain labs in the next 1-2 weeks  3. Encouraged heart healthy diet and increasing exercise to 30 minutes most days of the week, going no more than 2 days in a row without exercise. 4. Check BP 1-2 x per week at home, keep log, and bring to clinic at next appointment. 5. Follow up 3 months.

## 2020-03-28 ENCOUNTER — Other Ambulatory Visit: Payer: Self-pay

## 2020-03-28 ENCOUNTER — Other Ambulatory Visit: Payer: Medicaid Other

## 2020-03-29 LAB — CBC WITH DIFFERENTIAL/PLATELET
Absolute Monocytes: 703 cells/uL (ref 200–950)
Basophils Absolute: 67 cells/uL (ref 0–200)
Basophils Relative: 0.9 %
Eosinophils Absolute: 118 cells/uL (ref 15–500)
Eosinophils Relative: 1.6 %
HCT: 35.4 % (ref 35.0–45.0)
Hemoglobin: 11.5 g/dL — ABNORMAL LOW (ref 11.7–15.5)
Lymphs Abs: 2264 cells/uL (ref 850–3900)
MCH: 27.3 pg (ref 27.0–33.0)
MCHC: 32.5 g/dL (ref 32.0–36.0)
MCV: 83.9 fL (ref 80.0–100.0)
MPV: 10.5 fL (ref 7.5–12.5)
Monocytes Relative: 9.5 %
Neutro Abs: 4248 cells/uL (ref 1500–7800)
Neutrophils Relative %: 57.4 %
Platelets: 357 10*3/uL (ref 140–400)
RBC: 4.22 10*6/uL (ref 3.80–5.10)
RDW: 12.6 % (ref 11.0–15.0)
Total Lymphocyte: 30.6 %
WBC: 7.4 10*3/uL (ref 3.8–10.8)

## 2020-03-29 LAB — LIPID PANEL
Cholesterol: 248 mg/dL — ABNORMAL HIGH (ref <200)
HDL: 59 mg/dL (ref >=50)
LDL Cholesterol (Calc): 158 mg/dL (calc) — ABNORMAL HIGH
Non-HDL Cholesterol (Calc): 189 mg/dL (calc) — ABNORMAL HIGH (ref <130)
Total CHOL/HDL Ratio: 4.2 (calc) (ref <5.0)
Triglycerides: 173 mg/dL — ABNORMAL HIGH (ref <150)

## 2020-03-29 LAB — COMPLETE METABOLIC PANEL WITH GFR
AG Ratio: 1.7 (calc) (ref 1.0–2.5)
ALT: 10 U/L (ref 6–29)
AST: 12 U/L (ref 10–35)
Albumin: 4 g/dL (ref 3.6–5.1)
Alkaline phosphatase (APISO): 97 U/L (ref 37–153)
BUN: 15 mg/dL (ref 7–25)
CO2: 24 mmol/L (ref 20–32)
Calcium: 8.9 mg/dL (ref 8.6–10.4)
Chloride: 105 mmol/L (ref 98–110)
Creat: 0.96 mg/dL (ref 0.50–1.05)
GFR, Est African American: 77 mL/min/{1.73_m2} (ref >=60)
GFR, Est Non African American: 66 mL/min/{1.73_m2} (ref >=60)
Globulin: 2.4 g/dL (calc) (ref 1.9–3.7)
Glucose, Bld: 136 mg/dL — ABNORMAL HIGH (ref 65–99)
Potassium: 4.3 mmol/L (ref 3.5–5.3)
Sodium: 140 mmol/L (ref 135–146)
Total Bilirubin: 0.5 mg/dL (ref 0.2–1.2)
Total Protein: 6.4 g/dL (ref 6.1–8.1)

## 2020-03-29 LAB — TSH+FREE T4: TSH W/REFLEX TO FT4: 2.88 mIU/L (ref 0.40–4.50)

## 2020-04-01 ENCOUNTER — Ambulatory Visit: Payer: Self-pay

## 2020-04-01 NOTE — Telephone Encounter (Signed)
Patient called and says the pharmacist told her to call her provider office to ask if she should continue taking Spironolactone with Entresto due to the interaction of lowering or raising her potassium. She says she went to pick the Spironolactone up and when the pharmacist realized she was taking Entresto as well, that's when he addressed that with her. She says the pharmacist has sent something to the office about this, but no response, so that's why she's calling. I advised the office is closed and this message will be available to [place] on Monday to be addressed, then someone will call with her recommendation. Patient verbalized understanding.  Reason for Disposition  [1] Caller has NON-URGENT medicine question about med that PCP prescribed AND [2] triager unable to answer question  Answer Assessment - Initial Assessment Questions 1. NAME of MEDICATION: "What medicine are you calling about?"     Spironolactone  2. QUESTION: "What is your question?" (e.g., medication refill, side effect)     Should I continue it 3. PRESCRIBING HCP: "Who prescribed it?" Reason: if prescribed by specialist, call should be referred to that group.     Cyndia Skeeters, FNP 4. SYMPTOMS: "Do you have any symptoms?"     No 5. SEVERITY: If symptoms are present, ask "Are they mild, moderate or severe?"     N/A 6. PREGNANCY:  "Is there any chance that you are pregnant?" "When was your last menstrual period?"     No  Protocols used: MEDICATION QUESTION CALL-A-AH

## 2020-04-04 ENCOUNTER — Other Ambulatory Visit: Payer: Self-pay | Admitting: Family Medicine

## 2020-04-04 DIAGNOSIS — I5022 Chronic systolic (congestive) heart failure: Secondary | ICD-10-CM

## 2020-04-04 DIAGNOSIS — I10 Essential (primary) hypertension: Secondary | ICD-10-CM

## 2020-04-04 MED ORDER — SACUBITRIL-VALSARTAN 49-51 MG PO TABS: 1.0000 | ORAL_TABLET | Freq: Two times a day (BID) | ORAL | 3 refills | Status: DC

## 2020-04-04 MED ORDER — SPIRONOLACTONE 25 MG PO TABS: 25.0000 mg | ORAL_TABLET | Freq: Every day | ORAL | 2 refills | Status: DC

## 2020-04-04 NOTE — Telephone Encounter (Signed)
Rx sent to pharmacy on file.

## 2020-04-04 NOTE — Telephone Encounter (Signed)
Duplicate, patient can continue these medications based on her lab results.  Will repeat labs again in 3 months to evaluate her potassium level.  TY

## 2020-04-04 NOTE — Telephone Encounter (Signed)
The patient was notified of Erin Hayes recommendation. She request that you send a refill of her Spironolactone and Entresto to her pharmacy.

## 2020-05-11 ENCOUNTER — Other Ambulatory Visit: Payer: Self-pay

## 2020-05-11 ENCOUNTER — Encounter: Payer: Self-pay | Admitting: Family Medicine

## 2020-05-11 ENCOUNTER — Ambulatory Visit (INDEPENDENT_AMBULATORY_CARE_PROVIDER_SITE_OTHER): Payer: Medicaid Other | Admitting: Family Medicine

## 2020-05-11 VITALS — BP 162/106 | HR 88 | Temp 97.3°F | Ht 59.0 in | Wt 115.0 lb

## 2020-05-11 DIAGNOSIS — M25512 Pain in left shoulder: Secondary | ICD-10-CM | POA: Diagnosis not present

## 2020-05-11 DIAGNOSIS — E1165 Type 2 diabetes mellitus with hyperglycemia: Secondary | ICD-10-CM | POA: Diagnosis not present

## 2020-05-11 DIAGNOSIS — I5022 Chronic systolic (congestive) heart failure: Secondary | ICD-10-CM | POA: Diagnosis not present

## 2020-05-11 DIAGNOSIS — I10 Essential (primary) hypertension: Secondary | ICD-10-CM

## 2020-05-11 LAB — POCT GLYCOSYLATED HEMOGLOBIN (HGB A1C): Hemoglobin A1C: 7.2 % — AB (ref 4.0–5.6)

## 2020-05-11 MED ORDER — CARVEDILOL 12.5 MG PO TABS: 12.5000 mg | ORAL_TABLET | Freq: Two times a day (BID) | ORAL | 3 refills | Status: DC

## 2020-05-11 MED ORDER — SPIRONOLACTONE 25 MG PO TABS: 25.0000 mg | ORAL_TABLET | Freq: Every day | ORAL | 2 refills | Status: DC

## 2020-05-11 MED ORDER — SACUBITRIL-VALSARTAN 49-51 MG PO TABS: 1.0000 | ORAL_TABLET | Freq: Two times a day (BID) | ORAL | 3 refills | Status: DC

## 2020-05-11 NOTE — Progress Notes (Signed)
Subjective:    Patient ID: Erin Hayes, female    DOB: Sep 21, 1965, 55 y.o.   MRN: [id number]  Erin Hayes is a 55 y.o. female presenting on 05/11/2020 for Diabetes   HPI  Ms. Franzen presents to clinic for follow up on her diabetes and concerns of left shoulder discomfort.  Reports she has noticed over the past month she has been having left shoulder discomfort when trying to reach overhead and is able to get to 180 degrees of movement before having discomfort, has been avoiding lifting her arm over her head.  Denies previous injury, fall, trauma.  Denies numbness, tingling, weakness, or change in grip strength.  Diabetes Pt presents today for follow up Type 2 Diabetes Mellitus.  He/she (caps): She ACTION; IS/IS NOT: is not checking AM CBG at home. -Current diabetic medications include:  metformin 500mg  BID WC, lantus 14 units at bedtime, and bydureon 2mg  weekly -ACTION; IS/IS NOT: is not currently symptomatic -Actions; denies/reports/admits to: denies polydipsia, polyphagia, polyuria, headaches, diaphoresis, shakiness, chills, pain, numbness or tingling in extremities or changes in vision -Clinical course has been improving  -Reports no exercise routine -Diet is high in salt, high in fat, and high in carbohydrates  PREVENTION Eye exam current (within 1 year) Up to date Foot exam current (within 1 year) Up to date Lipid/ASCVD risk reduction - on statin: YES/NO: Yes  Kidney Protection (On ACE/ARB)? YES/NO: Yes   Depression screen [facility] 2/9 05/18/2019 03/04/2018 09/03/2017  Decreased Interest 0 0 0  Down, Depressed, Hopeless 0 0 0  PHQ - 2 Score 0 0 0    Social History   Tobacco Use  . Smoking status: Never Smoker  . Smokeless tobacco: Never Used  Vaping Use  . Vaping Use: Never used  Substance Use Topics  . Alcohol use: No  . Drug use: No    Review of Systems  Constitutional: Negative.   HENT: Negative.   Eyes: Negative.   Respiratory: Negative.   Cardiovascular:  Negative.   Gastrointestinal: Negative.   Endocrine: Negative.   Genitourinary: Negative.   Musculoskeletal: Positive for myalgias. Negative for arthralgias, back pain, gait problem, joint swelling, neck pain and neck stiffness.  Skin: Negative.   Allergic/Immunologic: Negative.   Neurological: Negative.   Hematological: Negative.   Psychiatric/Behavioral: Negative.    Per HPI unless specifically indicated above     Objective:    BP (!) 162/106 (BP Location: Right Arm, Patient Position: Sitting, Cuff Size: Normal)   Pulse 88   Temp (!) 97.3 F (36.3 C) (Temporal)   Ht 4\' 11"  (1.499 m)   Wt 115 lb (52.2 kg)   BMI 23.23 kg/m   Wt Readings from Last 3 Encounters:  05/11/20 115 lb (52.2 kg)  03/01/20 111 lb 12.8 oz (50.7 kg)  11/26/19 113 lb (51.3 kg)    Physical Exam Vitals and nursing note reviewed.  Constitutional:      General: She is not in acute distress.    Appearance: Normal appearance. She is well-developed and well-groomed. She is not ill-appearing or toxic-appearing.  HENT:     Head: Normocephalic and atraumatic.     Nose:     Comments: Lizbeth Bark is in place, covering mouth and nose. Eyes:     General: Lids are normal. Vision grossly intact.        Right eye: No discharge.        Left eye: No discharge.     Extraocular Movements: Extraocular movements intact.  Conjunctiva/sclera: Conjunctivae normal.     Pupils: Pupils are equal, round, and reactive to light.  Cardiovascular:     Rate and Rhythm: Normal rate and regular rhythm.     Pulses: Normal pulses.     Heart sounds: Normal heart sounds. No murmur heard. No friction rub. No gallop.   Pulmonary:     Effort: Pulmonary effort is normal. No respiratory distress.     Breath sounds: Normal breath sounds.  Musculoskeletal:        General: Tenderness present.     Right shoulder: Normal.     Left shoulder: Tenderness present. No swelling or deformity. Decreased range of motion. Normal strength. Normal  pulse.       Arms:     Right lower leg: No edema.     Left lower leg: No edema.     Comments: Full PROM, decreased AROM to 180 degrees before becomes painful, full strength  Skin:    General: Skin is warm and dry.     Capillary Refill: Capillary refill takes less than 2 seconds.  Neurological:     General: No focal deficit present.     Mental Status: She is alert and oriented to person, place, and time.  Psychiatric:        Attention and Perception: Attention and perception normal.        Mood and Affect: Mood and affect normal.        Speech: Speech normal.        Behavior: Behavior normal. Behavior is cooperative.        Thought Content: Thought content normal.        Cognition and Memory: Cognition and memory normal.        Judgment: Judgment normal.    Results for orders placed or performed in visit on 05/11/20  POCT glycosylated hemoglobin (Hb A1C)  Result Value Ref Range   Hemoglobin A1C 7.2 (A) 4.0 - 5.6 %   HbA1c POC (<> result, manual entry)     HbA1c, POC (prediabetic range)     HbA1c, POC (controlled diabetic range)        Assessment & Plan:   Problem List Items Addressed This Visit      Cardiovascular and Mediastinum   Essential hypertension    Currently following with Dr. Mariah Milling at [facility].  Will send in refills on carvedilol and sacubitril-valsartan to pharmacy on file.      Relevant Medications   carvedilol (COREG) 12.5 MG tablet   sacubitril-valsartan (ENTRESTO) 49-51 MG   spironolactone (ALDACTONE) 25 MG tablet   CHF (congestive heart failure) (HCC)    Currently following with Dr. Mariah Milling at [facility].      Relevant Medications   carvedilol (COREG) 12.5 MG tablet   sacubitril-valsartan (ENTRESTO) 49-51 MG   spironolactone (ALDACTONE) 25 MG tablet     Endocrine   Uncontrolled type 2 diabetes mellitus with hyperglycemia (HCC) - Primary    ControlledDM with A1c 7.2% improved from 7.3% on 03/01/2020 and goal A1c < 7.0%. - Complications -  hyperglycemia, CHF, GERD, HTN.  Plan:  1. Continue current therapy: metformin 500mg  BID WC, lantus 14 units at bedtime, and bydureon 2mg  weekly 2. Encourage improved lifestyle: - low carb/low glycemic diet reinforced prior education - Increase physical activity to 30 minutes most days of the week.  Explained that increased physical activity increases body's use of sugar for energy. 3. Check fasting am CBG and log these.  Bring log to next visit for review  4. Continue ASA, ACEi and Statin 5. Up to date on DM eye and foot exans 6. Follow-up 6 months      Relevant Orders   POCT glycosylated hemoglobin (Hb A1C) (Completed)     Other   Acute pain of left shoulder    Acute left shoulder pain with AROM, full PROM.  Likely rotator cuff strain/sprain.  Encourage alternating heat/ice and will refer to physical therapy to help with strengthening and ROM exercises.  Patient in agreement.  Referral to physical therapy placed.      Relevant Orders   Ambulatory referral to Physical Therapy      Meds ordered this encounter  Medications  . carvedilol (COREG) 12.5 MG tablet    Sig: Take 1 tablet (12.5 mg total) by mouth 2 (two) times daily.    Dispense:  180 tablet    Refill:  3  . sacubitril-valsartan (ENTRESTO) 49-51 MG    Sig: Take 1 tablet by mouth 2 (two) times daily.    Dispense:  30 tablet    Refill:  3  . spironolactone (ALDACTONE) 25 MG tablet    Sig: Take 1 tablet (25 mg total) by mouth daily.    Dispense:  90 tablet    Refill:  2   Follow up plan: Return in about 1 week (around 05/18/2020) for HTN F/U.   Harlin Rain, [place] Family Nurse Practitioner [facility] 05/11/2020, 12:21 PM

## 2020-05-11 NOTE — Assessment & Plan Note (Signed)
Acute left shoulder pain with AROM, full PROM.  Likely rotator cuff strain/sprain.  Encourage alternating heat/ice and will refer to physical therapy to help with strengthening and ROM exercises.  Patient in agreement.  Referral to physical therapy placed.

## 2020-05-11 NOTE — Assessment & Plan Note (Addendum)
ControlledDM with A1c 7.2% improved from 7.3% on 03/01/2020 and goal A1c < 7.0%. - Complications - hyperglycemia, CHF, GERD, HTN.  Plan:  1. Continue current therapy: metformin 500mg  BID WC, lantus 14 units at bedtime, and bydureon 2mg  weekly 2. Encourage improved lifestyle: - low carb/low glycemic diet reinforced prior education - Increase physical activity to 30 minutes most days of the week.  Explained that increased physical activity increases body's use of sugar for energy. 3. Check fasting am CBG and log these.  Bring log to next visit for review 4. Continue ASA, ACEi and Statin 5. Up to date on DM eye and foot exans 6. Follow-up 6 months

## 2020-05-11 NOTE — Patient Instructions (Addendum)
For Mammogram screening for breast cancer   Call the Imaging Center below anytime to schedule your own appointment now that order has been placed.  [facility] [address] [place], [place] [address] Phone: [phone]  [facility] [address] [place], [place] [address] Phone: [phone]  Restart on all of your hypertension medications.  It is important that you take these daily.  Try to get exercise a minimum of 30 minutes per day at least 5 days per week as well as  adequate water intake all while measuring blood pressure a few times per week.  Keep a blood pressure log and bring back to clinic at your next visit.  If your readings are consistently over 130/80 to contact our office/send me a MyChart message and we will see you sooner.  Can try DASH and Mediterranean diet options, avoiding processed foods, lowering sodium intake, avoiding pork products, and eating a plant based diet for optimal health.  We will plan to see you back in 1 week for hypertension re-evaluation  You will receive a survey after today's visit either digitally by e-mail or paper by USPS mail. Your experiences and feedback matter to [place].  Please respond so we know how we are doing as we provide care for you.  Call us with any questions/concerns/needs.  It is my goal to be available to you for your health concerns.  Thanks for choosing me to be a partner in your healthcare needs!  Charlaine Dalton, FNP-C Family Nurse Practitioner [facility] Phone: [phone]

## 2020-05-11 NOTE — Assessment & Plan Note (Signed)
Currently following with Dr. Mariah Milling at [facility].

## 2020-05-11 NOTE — Assessment & Plan Note (Signed)
Currently following with Dr. Mariah Milling at [facility].  Will send in refills on carvedilol and sacubitril-valsartan to pharmacy on file.

## 2020-05-18 ENCOUNTER — Ambulatory Visit (INDEPENDENT_AMBULATORY_CARE_PROVIDER_SITE_OTHER): Payer: Medicaid Other | Admitting: Family Medicine

## 2020-05-18 ENCOUNTER — Encounter: Payer: Self-pay | Admitting: Family Medicine

## 2020-05-18 ENCOUNTER — Other Ambulatory Visit: Payer: Self-pay

## 2020-05-18 VITALS — BP 151/101 | HR 82 | Temp 97.1°F | Resp 18 | Ht 59.0 in | Wt 114.6 lb

## 2020-05-18 DIAGNOSIS — I10 Essential (primary) hypertension: Secondary | ICD-10-CM

## 2020-05-18 NOTE — Assessment & Plan Note (Signed)
Uncontrolled HTN, reports taking her medications.  Discussed need for follow up with [facility] heartcare, Dr. Donivan Scull office for re-evaluation of her HTN, CHF and medication adjustments.  Patient in agreement and reports will contact their office today to schedule appointment.

## 2020-05-18 NOTE — Progress Notes (Signed)
Subjective:    Patient ID: Erin Hayes, female    DOB: Sep 02, 1965, 55 y.o.   MRN: [id number]  Erin Hayes is a 55 y.o. female presenting on 05/18/2020 for Hypertension   HPI  [name] presents to clinic for a follow up on her hypertension.  Reports she has taken her blood pressure medication this morning.    Hypertension - She is not checking BP at home or outside of clinic.    - Current medications: coreg 12.5mg  BID, sacubitril-valsartan 49-51mg  daily, tolerating well without side effects - She is not currently symptomatic. - Pt denies headache, lightheadedness, dizziness, changes in vision, chest tightness/pressure, palpitations, leg swelling, sudden loss of speech or loss of consciousness. - She  reports no regular exercise routine. - Her diet is high in salt, high in fat, and high in carbohydrates.  Depression screen [facility] 2/9 05/18/2020 05/18/2019 03/04/2018  Decreased Interest 0 0 0  Down, Depressed, Hopeless 0 0 0  PHQ - 2 Score 0 0 0    Social History   Tobacco Use  . Smoking status: Never Smoker  . Smokeless tobacco: Never Used  Vaping Use  . Vaping Use: Never used  Substance Use Topics  . Alcohol use: No  . Drug use: No    Review of Systems  Constitutional: Negative.   HENT: Negative.   Eyes: Negative.   Respiratory: Negative.   Cardiovascular: Negative.   Gastrointestinal: Negative.   Endocrine: Negative.   Genitourinary: Negative.   Musculoskeletal: Negative.   Skin: Negative.   Allergic/Immunologic: Negative.   Neurological: Negative.   Hematological: Negative.   Psychiatric/Behavioral: Negative.    Per HPI unless specifically indicated above     Objective:    BP (!) 151/101 (BP Location: Right Arm, Patient Position: Sitting, Cuff Size: Normal)   Pulse 82   Temp (!) 97.1 F (36.2 C) (Temporal)   Resp 18   Ht 4\' 11"  (1.499 m)   Wt 114 lb 9.6 oz (52 kg)   SpO2 100%   BMI 23.15 kg/m   Wt Readings from Last 3 Encounters:  05/18/20 114  lb 9.6 oz (52 kg)  05/11/20 115 lb (52.2 kg)  03/01/20 111 lb 12.8 oz (50.7 kg)    Physical Exam Vitals and nursing note reviewed.  Constitutional:      General: She is not in acute distress.    Appearance: Normal appearance. She is well-developed, well-groomed and normal weight. She is not ill-appearing or toxic-appearing.  HENT:     Head: Normocephalic and atraumatic.     Nose:     Comments: Erin Hayes is in place, covering mouth and nose. Eyes:     General: Lids are normal. Vision grossly intact.        Right eye: No discharge.        Left eye: No discharge.     Extraocular Movements: Extraocular movements intact.     Conjunctiva/sclera: Conjunctivae normal.     Pupils: Pupils are equal, round, and reactive to light.  Cardiovascular:     Rate and Rhythm: Normal rate and regular rhythm.     Pulses: Normal pulses.     Heart sounds: Normal heart sounds. No murmur heard. No friction rub. No gallop.   Pulmonary:     Effort: Pulmonary effort is normal. No respiratory distress.     Breath sounds: Normal breath sounds.  Skin:    General: Skin is warm and dry.     Capillary Refill: Capillary refill takes less  than 2 seconds.  Neurological:     General: No focal deficit present.     Mental Status: She is alert and oriented to person, place, and time.  Psychiatric:        Attention and Perception: Attention and perception normal.        Mood and Affect: Mood and affect normal.        Speech: Speech normal.        Behavior: Behavior normal. Behavior is cooperative.        Thought Content: Thought content normal.        Cognition and Memory: Cognition and memory normal.        Judgment: Judgment normal.    Results for orders placed or performed in visit on 05/11/20  POCT glycosylated hemoglobin (Hb A1C)  Result Value Ref Range   Hemoglobin A1C 7.2 (A) 4.0 - 5.6 %   HbA1c POC (<> result, manual entry)     HbA1c, POC (prediabetic range)     HbA1c, POC (controlled diabetic range)         Assessment & Plan:   Problem List Items Addressed This Visit      Cardiovascular and Mediastinum   Essential hypertension - Primary    Uncontrolled HTN, reports taking her medications.  Discussed need for follow up with [facility] heartcare, Dr. Donivan Scull office for re-evaluation of her HTN, CHF and medication adjustments.  Patient in agreement and reports will contact their office today to schedule appointment.         No orders of the defined types were placed in this encounter.  Follow up plan: Return in about 6 months (around 11/15/2020).   Harlin Rain, [place] Family Nurse Practitioner [facility] 05/18/2020, 10:18 AM

## 2020-05-18 NOTE — Patient Instructions (Addendum)
Contact Dr. Donivan Scull office with [facility] for follow up visit for medication management with your blood pressure  We will plan to see you back in 6 months for hypertension and diabetes follow up visit  You will receive a survey after today's visit either digitally by e-mail or paper by USPS mail. Your experiences and feedback matter to [place].  Please respond so we know how we are doing as we provide care for you.  Call us with any questions/concerns/needs.  It is my goal to be available to you for your health concerns.  Thanks for choosing me to be a partner in your healthcare needs!  Harlin Rain, FNP-C Family Nurse Practitioner [facility] Phone: [phone]

## 2020-05-19 NOTE — Progress Notes (Unsigned)
. Cardiology Office Note  Date:  05/20/2020   ID:  Erin Hayes, DOB [birth year], MRN [id number]  PCP:  Verl Bangs, FNP   Chief Complaint  Patient presents with  . Other    Early 12 month follow up due to Elevated BP. Meds reviewed verbally with patient.     HPI:  55 year old female with a history of  diabetes,  hypertension,  hyperlipidemia,  chronic diarrhea,  GERD,  pneumonia, 02/2017 chronic combined systolic and diastolic congestive heart failure/nonischemic cardiomyopathy (from PNA?) By cath 03/2017, nonobstructive disease,   EF 15-20% by cath, 20-25% by echo Left pleural effusion Ef up to 45 to 50% in Jan 2021 Who presents for routine follow-up of her nonischemic cardiomyopathy  Last seen in clinic by myself June 09, 2019  BP elevated with PMD She does not have blood pressure cuff at home  HBa1c 7.2, much better Now for much higher numbers 1-1/2 years ago, approximately 12  Denies chest pain, no shortness of breath Used a wheelchair to get into the office today In general no complaints  Cholesterol continues to run high, 250 Unclear if she is taking Crestor 20  Echo 05/21/2019 reviewed 1. Left ventricular ejection fraction, by visual estimation, is 45 to  50%. The left ventricle has normal function. There is mildly increased  left ventricular hypertrophy.   EKG personally reviewed by myself on todays visit Shows NSR with ivcd rater 85 bpm , LAFB Unable to exclude old anterior MI, no change from prior EKG January 2021  Prior medical records reviewed on today's visit  ED 02/04/17 due to pneumonia  Admitted 02/18/17 due to LLL pneumonia. Initially needed IV antibiotics & then transitioned to oral antibiotics. Discharged  Admitted 04/03/17 due to acute HF.   IV diuretics  echo and  catheterization by Dr.  Fletcher Anon showing nonischemic cardiomyopathy ejection fraction less than 25%   Recently she reports she has been doing well, denies any lower  extremity edema or shortness of breath or abdominal bloating Weight has been stable, Carvedilol is on her list but she does not have this medication No significant change in her weight Feels almost back to normal   cardiac catheterization November 2018 Ejection fraction 25% or less, mild nonobstructive coronary disease, left ventricular end-diastolic pressure 33   PMH:   has a past medical history of Carpal tunnel syndrome of right wrist, Chronic combined systolic (congestive) and diastolic (congestive) heart failure ([place]), Diabetes ([place]), GERD (gastroesophageal reflux disease), HLD (hyperlipidemia), Hypertension, NICM (nonischemic cardiomyopathy) ([place]), Pleural effusion, left, and Pneumonia (02/19/2017).  PSH:    Past Surgical History:  Procedure Laterality Date  . COLONOSCOPY WITH PROPOFOL N/A 01/21/2017   Procedure: COLONOSCOPY WITH PROPOFOL;  Surgeon: Jonathon Bellows, MD;  Location: [facility] ENDOSCOPY;  Service: Gastroenterology;  Laterality: N/A;  . ESOPHAGOGASTRODUODENOSCOPY (EGD) WITH PROPOFOL N/A 01/21/2017   Procedure: ESOPHAGOGASTRODUODENOSCOPY (EGD) WITH PROPOFOL;  Surgeon: Jonathon Bellows, MD;  Location: [facility] ENDOSCOPY;  Service: Gastroenterology;  Laterality: N/A;  . FLEXIBLE SIGMOIDOSCOPY N/A 11/04/2016   Procedure: FLEXIBLE SIGMOIDOSCOPY;  Surgeon: Wilford Corner, MD;  Location: [facility] ENDOSCOPY;  Service: Endoscopy;  Laterality: N/A;  . LEFT HEART CATH AND CORONARY ANGIOGRAPHY N/A 04/05/2017   Procedure: LEFT HEART CATH AND CORONARY ANGIOGRAPHY;  Surgeon: Wellington Hampshire, MD;  Location: [place] [facility] LAB;  Service: Cardiovascular;  Laterality: N/A;  . LITHOTRIPSY      Current Outpatient Medications  Medication Sig Dispense Refill  . albuterol (VENTOLIN HFA) 108 (90 Base) MCG/ACT inhaler Inhale 2  puffs into the lungs every 4 (four) hours as needed for wheezing or shortness of breath. 6.7 g 1  . aspirin 81 MG tablet Take 81 mg by mouth daily.    . chlorhexidine (PERIDEX) 0.12 %  solution 15 mLs 2 (two) times daily.    . Exenatide ER (BYDUREON BCISE) 2 MG/0.85ML AUIJ Inject 2 mg into the skin once a week. 4 pen 1  . furosemide (LASIX) 20 MG tablet Take 1 tablet (20 mg total) by mouth daily. 90 tablet 3  . glucose blood (CONTOUR TEST) test strip by Other route Two (2) times a day.    [name] HYDROcodone-acetaminophen (NORCO/VICODIN) 5-325 MG tablet Take 1 tablet by mouth every 6 (six) hours as needed.    . hydrocortisone 2.5 % lotion Apply topically.    [name] Hyoscyamine Sulfate SL 0.125 MG SUBL Dissolve 1-2 tablets on your tongue every 4-6 hours as needed for cramping or diarrhea    . ibuprofen (ADVIL) 400 MG tablet Take 400 mg by mouth 3 (three) times daily.    . insulin glargine (LANTUS) 100 UNIT/ML Solostar Pen Inject 14 Units into the skin at bedtime. 15 mL 3  . Insulin Pen Needle 31G X 5 MM MISC Use as directed with Lantus 100 each 3  . loperamide (IMODIUM) 1 MG/5ML solution Take 1 mg by mouth daily.    . meloxicam (MOBIC) 15 MG tablet Take 1 tablet (15 mg total) by mouth daily. 90 tablet 1  . metFORMIN (GLUCOPHAGE) 500 MG tablet Take 1 tablet (500 mg total) by mouth 2 (two) times daily with a meal. 180 tablet 1  . ondansetron (ZOFRAN) 4 MG tablet Take by mouth.    . sacubitril-valsartan (ENTRESTO) 49-51 MG Take 1 tablet by mouth 2 (two) times daily. 30 tablet 3  . spironolactone (ALDACTONE) 25 MG tablet Take 1 tablet (25 mg total) by mouth daily. 90 tablet 2  . carvedilol (COREG) 12.5 MG tablet Take 1. Tablet  2 (two) times daily. 270 tablet 3  . rosuvastatin (CRESTOR) 20 MG tablet Take 1 tablet (20 mg total) by mouth daily. 90 tablet 3   No current facility-administered medications for this visit.     Allergies:   Patient has no known allergies.   Social History:  The patient  reports that she has never smoked. She has never used smokeless tobacco. She reports that she does not drink alcohol and does not use drugs.   Family History:   family history includes CAD in  her father; Heart disease in her father; Hypertension in her father; Lung cancer in her mother.    Review of Systems: Review of Systems  Constitutional: Negative.   Respiratory: Negative.   Cardiovascular: Negative.   Gastrointestinal: Negative.   Musculoskeletal: Negative.   Neurological: Negative.   Psychiatric/Behavioral: Negative.   All other systems reviewed and are negative.    PHYSICAL EXAM: VS:  BP 128/90 (BP Location: Right Arm, Patient Position: Sitting, Cuff Size: Normal)   Pulse 85   Ht 4\' 11"  (1.499 m)   Wt 116 lb (52.6 kg)   SpO2 97%   BMI 23.43 kg/m  , BMI Body mass index is 23.43 kg/m. Constitutional:  oriented to person, place, and time. No distress.  Presenting in a wheelchair HENT:  Head: Grossly normal Eyes:  no discharge. No scleral icterus.  Neck: No JVD, no carotid bruits  Cardiovascular: Regular rate and rhythm, no murmurs appreciated Pulmonary/Chest: Clear to auscultation bilaterally, no wheezes or rails Abdominal: Soft.  no distension.  no tenderness.  Musculoskeletal: Normal range of motion Neurological:  normal muscle tone. Coordination normal. No atrophy Skin: Skin warm and dry Psychiatric: normal affect, pleasant   Recent Labs: 03/28/2020: ALT 10; BUN 15; Creat 0.96; Hemoglobin 11.5; Platelets 357; Potassium 4.3; Sodium 140    Lipid Panel Lab Results  Component Value Date   CHOL 248 (H) 03/28/2020   HDL 59 03/28/2020   LDLCALC 158 (H) 03/28/2020   TRIG 173 (H) 03/28/2020      Wt Readings from Last 3 Encounters:  05/20/20 116 lb (52.6 kg)  05/18/20 114 lb 9.6 oz (52 kg)  05/11/20 115 lb (52.2 kg)      ASSESSMENT AND PLAN:  Chronic systolic congestive heart failure ([place]) - Plan: EKG stable,  lab work reviewed, appears relatively euvolemic on exam Some report of high blood pressure, recommend she increase carvedilol up to 18.75 mg twice daily He does not have blood pressure cuff at home to monitor Much better number here  today compared to primary care We will go slowly with medication changes  Type 2 diabetes mellitus without complication, without long-term current use of insulin (HCC)  hemoglobin A1c 12 down to 7 Remarkable improvement, discussed with her  NICM (nonischemic cardiomyopathy) ([place]) EF 45 to 50% Stay on entresto Carvedilol, spironolactone, Lasix  Essential hypertension Small changes as above to carvedilol   Recommended booster  Total encounter time more than 25 minutes  Greater than 50% was spent in counseling and coordination of care with the patient    Orders Placed This Encounter  Procedures  . EKG 12-Lead     Signed, Esmond Plants, M.D., Ph.D. 05/20/2020  [place], [place]

## 2020-05-20 ENCOUNTER — Ambulatory Visit (INDEPENDENT_AMBULATORY_CARE_PROVIDER_SITE_OTHER): Payer: Medicaid Other | Admitting: Cardiovascular Disease

## 2020-05-20 ENCOUNTER — Encounter: Payer: Self-pay | Admitting: Cardiovascular Disease

## 2020-05-20 ENCOUNTER — Other Ambulatory Visit: Payer: Self-pay

## 2020-05-20 VITALS — BP 128/90 | HR 85 | Ht 59.0 in | Wt 116.0 lb

## 2020-05-20 DIAGNOSIS — E782 Mixed hyperlipidemia: Secondary | ICD-10-CM

## 2020-05-20 DIAGNOSIS — I428 Other cardiomyopathies: Secondary | ICD-10-CM

## 2020-05-20 DIAGNOSIS — E119 Type 2 diabetes mellitus without complications: Secondary | ICD-10-CM

## 2020-05-20 DIAGNOSIS — Z794 Long term (current) use of insulin: Secondary | ICD-10-CM

## 2020-05-20 DIAGNOSIS — I5022 Chronic systolic (congestive) heart failure: Secondary | ICD-10-CM

## 2020-05-20 DIAGNOSIS — I10 Essential (primary) hypertension: Secondary | ICD-10-CM

## 2020-05-20 MED ORDER — CARVEDILOL 12.5 MG PO TABS: 18.7500 mg | ORAL_TABLET | Freq: Two times a day (BID) | ORAL | 3 refills | Status: DC

## 2020-05-20 MED ORDER — ROSUVASTATIN CALCIUM 20 MG PO TABS: 20.0000 mg | ORAL_TABLET | Freq: Every day | ORAL | 3 refills | Status: DC

## 2020-05-20 NOTE — Patient Instructions (Addendum)
Medication Instructions:  Please increase the coreg/carvedilol up to 1 1/2 pills two times a day (18.75 mg two times a day)  Don't forget your rosuvastin/crestor one a day, for cholesterol  Lab work: none Testing/Procedures: none   Follow-Up: At [facility], you and your health needs are our priority.  As part of our continuing mission to provide you with exceptional heart care, we have created designated Provider Care Teams.  These Care Teams include your primary Cardiologist (physician) and Advanced Practice Providers (APPs -  Physician Assistants and Nurse Practitioners) who all work together to provide you with the care you need, when you need it.  . You will need a follow up appointment in 12 months  . Providers on your designated Care Team:   . Murray Hodgkins, NP . Christell Faith, PA-C . Marrianne Mood, PA-C  Any Other Special Instructions Will Be Listed Below (If Applicable).  COVID-19 Vaccine Information can be found at: ShippingScam.co.uk For questions related to vaccine distribution or appointments, please email [email or web address] or call [phone].

## 2020-05-30 ENCOUNTER — Other Ambulatory Visit: Payer: Self-pay

## 2020-05-30 ENCOUNTER — Ambulatory Visit (INDEPENDENT_AMBULATORY_CARE_PROVIDER_SITE_OTHER): Payer: Medicaid Other | Admitting: Podiatry

## 2020-05-30 ENCOUNTER — Other Ambulatory Visit: Payer: Self-pay | Admitting: Podiatry

## 2020-05-30 ENCOUNTER — Encounter: Payer: Self-pay | Admitting: Podiatry

## 2020-05-30 ENCOUNTER — Ambulatory Visit (INDEPENDENT_AMBULATORY_CARE_PROVIDER_SITE_OTHER): Payer: Medicaid Other

## 2020-05-30 DIAGNOSIS — M19072 Primary osteoarthritis, left ankle and foot: Secondary | ICD-10-CM

## 2020-05-30 DIAGNOSIS — M79674 Pain in right toe(s): Secondary | ICD-10-CM

## 2020-05-30 DIAGNOSIS — S92302A Fracture of unspecified metatarsal bone(s), left foot, initial encounter for closed fracture: Secondary | ICD-10-CM | POA: Diagnosis not present

## 2020-05-30 DIAGNOSIS — E1165 Type 2 diabetes mellitus with hyperglycemia: Secondary | ICD-10-CM

## 2020-05-30 DIAGNOSIS — B351 Tinea unguium: Secondary | ICD-10-CM

## 2020-05-30 DIAGNOSIS — M79675 Pain in left toe(s): Secondary | ICD-10-CM | POA: Diagnosis not present

## 2020-05-31 ENCOUNTER — Encounter: Payer: Self-pay | Admitting: Podiatry

## 2020-05-31 NOTE — Progress Notes (Signed)
This patient returns to my office for at risk foot care.  This patient requires this care by a professional since this patient will be at risk due to having diabetes with angiopathy.  This patient is unable to cut nails herself since the patient cannot reach her nails.These nails are painful walking and wearing shoes.  This patient presents for at risk foot care today. Patient had injury by falling in Oct 2020 and has persistant pain and swelling left foot.   General Appearance  Alert, conversant and in no acute stress.  Vascular  Dorsalis pedis and posterior tibial  pulses are weakly  palpable  bilaterally.  Capillary return is within normal limits  bilaterally. Temperature is within normal limits  bilaterally.  Neurologic  Senn-Weinstein monofilament wire test within normal limits  bilaterally. Muscle power within normal limits bilaterally.  Nails Thick disfigured discolored nails with subungual debris  from hallux to fifth toes bilaterally. No evidence of bacterial infection or drainage bilaterally.  Orthopedic  No limitations of motion  feet .  No crepitus or effusions noted.  No bony pathology or digital deformities noted. Swelling over left forefoot due to previous bone fracture.  Palpable pain over 2,3 and fourth metatarsals especially at the head of metatarsals 2,3 left foot.  Skin  normotropic skin with no porokeratosis noted bilaterally.  No signs of infections or ulcers noted.     Onychomycosis  Pain in right toes  Pain in left toes  S/P closed fracture metatarsals left foot.  Consent was obtained for treatment procedures.   Mechanical debridement of nails 1-5  bilaterally performed with a nail nipper.  Filed with dremel without incident. Xrays taken reveal old fracture of 3 and 4 metatarsal head fractures left foot.  Marked osteopenia.  Discussed condition with patient.  Applied unna boot left foot.  Patient to wear unna boot two weeks and walk in surgical shoe.  RTC 2 weeks for follow up  of old fracture.   Return office visit    3 months for nail care.                 Told patient to return for periodic foot care and evaluation due to potential at risk complications.   Gardiner Barefoot DPM

## 2020-06-06 ENCOUNTER — Ambulatory Visit: Payer: Medicaid Other | Admitting: Family Medicine

## 2020-06-08 ENCOUNTER — Other Ambulatory Visit: Payer: Self-pay

## 2020-06-08 ENCOUNTER — Ambulatory Visit: Payer: Medicaid Other | Attending: Family Medicine | Admitting: Physical Therapy

## 2020-06-08 ENCOUNTER — Encounter: Payer: Self-pay | Admitting: Physical Therapy

## 2020-06-08 DIAGNOSIS — G8929 Other chronic pain: Secondary | ICD-10-CM

## 2020-06-08 DIAGNOSIS — M542 Cervicalgia: Secondary | ICD-10-CM | POA: Diagnosis present

## 2020-06-08 DIAGNOSIS — M25512 Pain in left shoulder: Secondary | ICD-10-CM | POA: Insufficient documentation

## 2020-06-08 DIAGNOSIS — R202 Paresthesia of skin: Secondary | ICD-10-CM

## 2020-06-08 NOTE — Therapy (Signed)
[facility] [address] [place], [address] Phone: [phone]   Fax:  [phone]  Physical Therapy Evaluation  Patient Details  Name: Erin Hayes MRN: [id number] Date of Birth: Aug 09, 1965 Referring Provider (PT): Verl Bangs, FNP   Encounter Date: 06/08/2020   PT End of Session - 06/08/20 1315    Visit Number 1    Number of Visits 24    Date for PT Re-Evaluation 08/31/20    Authorization Type [employer] [place] reporting period from 06/08/2020    Authorization - Visit Number 1    Authorization - Number of Visits 1    Progress Note Due on Visit 10    PT Start Time 0935    PT Stop Time 1030    PT Time Calculation (min) 55 min    Activity Tolerance Patient limited by pain    Behavior During Therapy [facility] for tasks assessed/performed           Past Medical History:  Diagnosis Date  . Carpal tunnel syndrome of right wrist   . Chronic combined systolic (congestive) and diastolic (congestive) heart failure ([place])    a. 03/2017 Echo: EF 20-25%, diff HK, Gr2 DD; b. 09/2017 Echo: EF 20-25%, diff HK, Gr2 DD.  . Diabetes ([place])   . GERD (gastroesophageal reflux disease)   . HLD (hyperlipidemia)   . Hypertension   . NICM (nonischemic cardiomyopathy) ([place])    a. 03/2017 Echo: EF 20-25%, diff HK, Gr2 DD, mild to mod MR, nl RV fxn;  b. 03/2017 Cath: mild nonobs dzs, EF 25%; c. 09/2017 Echo: EF 20-25%, diff HK. Gr2 DD. Mild MR. Mildly reduced RV fxn.  . Pleural effusion, left    a. 03/2017 s/p thoracentesis-->300 ml withdrawn--transudative.  . Pneumonia 02/19/2017    Past Surgical History:  Procedure Laterality Date  . COLONOSCOPY WITH PROPOFOL N/A 01/21/2017   Procedure: COLONOSCOPY WITH PROPOFOL;  Surgeon: Jonathon Bellows, MD;  Location: [facility] ENDOSCOPY;  Service: Gastroenterology;  Laterality: N/A;  . ESOPHAGOGASTRODUODENOSCOPY (EGD) WITH PROPOFOL N/A 01/21/2017   Procedure:  ESOPHAGOGASTRODUODENOSCOPY (EGD) WITH PROPOFOL;  Surgeon: Jonathon Bellows, MD;  Location: [facility] ENDOSCOPY;  Service: Gastroenterology;  Laterality: N/A;  . FLEXIBLE SIGMOIDOSCOPY N/A 11/04/2016   Procedure: FLEXIBLE SIGMOIDOSCOPY;  Surgeon: Wilford Corner, MD;  Location: [facility] ENDOSCOPY;  Service: Endoscopy;  Laterality: N/A;  . LEFT HEART CATH AND CORONARY ANGIOGRAPHY N/A 04/05/2017   Procedure: LEFT HEART CATH AND CORONARY ANGIOGRAPHY;  Surgeon: Wellington Hampshire, MD;  Location: [facility];  Service: Cardiovascular;  Laterality: N/A;  . LITHOTRIPSY      There were no vitals filed for this visit.    Subjective Assessment - 06/08/20 1312    Subjective Patient states her left shoulder started hurting and every time she moves it hurts now. She states it has been bothering her for about 4-5 weeks. She reports no injury. Came on gradually over time. She denies similar problems in the past. States it is worsening over time. Denies pain in the neck or further down the arm. Denies history of neck pain. She is right handed. She has put muscle rub on it which helped a little but has not tried any other treatments. Describes quality as "painful." Does say she may have L CTS. She has paresthesia in the left hand but it seems to have gotten worse since her shoulder started hurting. Working on getting disability due to diabetes, heart condition, chronic diarrhea. Pain: C:  5/10, W: 9/10, B: 0/10. Location: anterior left shoulder and down anterior upper arm with movement. Agg: using L UE, reaching forwards, sideways, backwards, etc. Ease: muscle rub, not using arm. Quality: "painful." Hobbies: cross stitching.    Pertinent History Patient is a 55 y.o. female who presents to outpatient physical therapy with a referral for medical diagnosis acute pain of left shoulder. This patient's chief complaints consist of left shoulder pain, leading to the following functional deficits: difficulty with usual activities that  require use of left UE including reaching for things with left hand, turning to the left, dressing, grooming, getting groceries,  cooking, cleaning, cross stitching, sleeping.  Relevant past medical history and comorbidities include carpal tunnel syndrome (maybe undiagnosed left), CHF, Diabetes, hypertension, nonischemic cardiomyopathy, hx of pleural effusion left lung (2018), hx left heart cath and coronary angiography (2018), L foot fracture (fall Oct 2020) wears shoe. Patient denies hx of cancer, stroke, seizures, unexplained weight loss, changes in bowel or bladder problems, new onset stumbling or dropping things.    Limitations Lifting;House hold activities;Other (comment)   difficulty with usual activities that require use of left UE including reaching for things with left hand, turning to the left, dressing, grooming, getting groceries,  cooking, cleaning, cross stitching, sleeping.   Currently in Pain? Yes    Pain Score 5     Aggravating Factors  using L UE, reaching forwards, sideways, backwards, etc    Pain Relieving Factors muscle rub, not using arm.    Effect of Pain on Daily Activities Functional Limitations: difficulty with usual activities that require use of left UE including reaching for things with left hand, turning to the left, dressing, grooming, getting groceries,  cooking, cleaning, cross stitching, sleeping.              [facility] PT Assessment - 06/08/20 0001      Assessment   Medical Diagnosis acute pain of left shoulder    Referring Provider (PT) Lorine Bears, Lupita Raider, FNP    Onset Date/Surgical Date 05/04/20    Hand Dominance Right    Next MD Visit no visit scheduled    Prior Therapy none for this problem prior to current episode of care      Precautions   Precautions None      Restrictions   Weight Bearing Restrictions No      Balance Screen   Has the patient fallen in the past 6 months No    Has the patient had a decrease in activity level because of a fear of  falling?  No    Is the patient reluctant to leave their home because of a fear of falling?  No      Home Environment   Living Environment --   no concerns about getting around home     Prior Function   Level of Independence Independent    Vocation Retired   working on getting disability   Leisure cross stitching, crafts      Cognition   Overall Cognitive Status Within Functional Limits for tasks assessed      Observation/Other Assessments   Focus on Therapeutic Outcomes (FOTO)  41           OBJECTIVE  OBSERVATION/INSPECTION . Posture correction: sloping shoulders, L shoulder slightly lower than right, increased kyphosis near CT junction . Tremor: none . Muscle bulk: no gross abnormalities noted . Bed mobility: supine <> sit WFL with pain at L shoulder . Transfers: sit <> stand WFL  . Gait:  grossly WFL for household and short community ambulation. More detailed gait analysis deferred to later date as needed.    NEUROLOGICAL Dermatomes . C2-T1 appears equal and intact to light touch. Except painful over anterior shoulder (C4) likely related to it being site of chief complaint.   SPINE MOTION  Cervical Spine AROM with OP as indicated *Indicates pain Flexion: = WFL Extension: = WFL Rotation: R= WFL, L = concordant pain at L shoulder with OP. Side Flexion: R= WFL, L = concordant pain at L shoulder with OP  Protraction = WFL Retraction = WFL  PERIPHERAL JOINT MOTION (in degrees)  Active Range of Motion (AROM) *Indicates pain 06/08/20 Date Date  Joint/Motion R/L R/L R/L  Shoulder     Flexion 180/125* / /  Extension 75/40* / /  Abduction 180/106* / /  External rotation 90/62* / /  Internal rotation T5/L1* / /  Comments:  06/08/2020: B elbows, wrists WFL and equal bilaterally. Paresthesia in L hand with shoulder ROM.   Passive Range of Motion (PROM) Comments:  06/08/2020:  L shoulder PROM  flexion 180 degrees with difficulty due to pain.  Abduction ~ 90 degrees with  pain ER 20 degrees with pain  MUSCLE PERFORMANCE (MMT):  *Indicates pain 06/08/20 Date Date  Joint/Motion R/L R/L R/L  Shoulder     Flexion 4+/4* / /  Abduction 5/4* / /  External rotation 4+/4+* / /  Internal rotation 4+/4+ / /  Extension / / /  Elbow     Flexion 4+/3+* / /  Extension 4+/4* / /  Comments:   SPECIAL TESTS: Shoulder special tests: Shoulder positive on L: neer's, speed's, hawkin's kennedy, yourgason's Cervical spine special tests:  - L spurling's positive for L UT pain (concordant) - Axial compression and distraction negative Neurodynamic testing:  ULNT: reproduction of L shoulder pain with ULNTA and with any passive extension of wrist  ACCESSORY MOTION:  - L sided pain with CPA and left UPA at cervical spine - No grinding, popping at left Ramireno joint within range restricted by guarding.  - No pain with AP at [facility] joint.   PALPATION: - TTP at anterior L shoulder at pec muscles, posterior shoulder at infraspinatus region, L UT, less tender over lateral L shoulder.  EDUCATION/COGNITION: Patient is alert and oriented X 4.  Objective measurements completed on examination: See above findings.    TREATMENT:  Therapeutic exercise: to centralize symptoms and improve ROM, strength, muscular endurance, and activity tolerance required for successful completion of functional activities.  - seated scapular retraction x 10 - seated cervical retraction x 10 with and without wall (unable without overactive shoulder girdle despite cuing) - standing L shoulder pendulum x 10 CW and CCW - supine chin tuck x 5 with 3 second hold - Education on diagnosis, prognosis, POC, anatomy and physiology of current condition.  - Education on HEP including handout    HOME EXERCISE PROGRAM  Access Code: [email or web address] URL: [email or web address] Date: 06/08/2020 Prepared by: [name]  Exercises Seated Scapular Retraction - 2 x daily - 3 sets - 10 reps - 1 second hold Circular  Shoulder Pendulum with Table Support - 2 x daily - 20 reps Supine Chin Tuck - 2 x daily - 2 sets - 10 reps - 2 seconds hold     PT Education - 06/08/20 1314    Education Details Exercise purpose/form. Self management techniques. Education on diagnosis, prognosis, POC, anatomy and physiology of current condition. Education on HEP including handout.  Person(s) Educated Patient    Methods Explanation;Demonstration;Tactile cues;Verbal cues;Handout    Comprehension Verbalized understanding;Returned demonstration;Verbal cues required;Tactile cues required;Need further instruction            PT Short Term Goals - 06/08/20 1332      PT SHORT TERM GOAL #1   Title Be independent with initial home exercise program for self-management of symptoms.    Baseline initial HEP provided at IE (06/08/20);    Time 3    Period Weeks    Status New    Target Date 06/29/20             PT Long Term Goals - 06/08/20 1332      PT LONG TERM GOAL #1   Title Be independent with a long-term home exercise program for self-management of symptoms.    Baseline initial HEP provided at IE (06/08/20);    Time 12    Period Weeks    Status New   TARGET DATE FOR ALL LONG TERM GOALS: 08/31/2020     PT LONG TERM GOAL #2   Title Demonstrate improved FOTO score to 63 by visit #11 to demonstrate improvement in overall condition and self-reported functional ability.    Baseline 41 (06/28/2020);    Time 12    Period Weeks    Status New      PT LONG TERM GOAL #3   Title Have full left shoulder AROM with no compensations or increase in pain in all planes except intermittent end range discomfort to allow patient to complete valued activities with less difficulty.    Baseline limited and painfull - see objective exam (06/08/20);    Time 12    Period Weeks    Status New      PT LONG TERM GOAL #4   Title Improve left shoulder strength to 4+/5 with no increase in pain for improved ability to allow patient to complete valued  functional tasks such as reaching overhead and crafting with less difficulty.    Baseline limited and painfull - see objective exam (06/08/20);    Time 12    Period Weeks    Status New      PT LONG TERM GOAL #5   Title Complete community, work and/or recreational activities without limitation due to current condition.    Baseline Functional Limitations: difficulty with usual activities that require use of left UE including reaching for things with left hand, turning to the left, dressing, grooming, getting groceries,  cooking, cleaning, cross stitching, sleeping (06/08/2020);    Time 12    Period Weeks    Status New                  Plan - 06/08/20 1329    Clinical Impression Statement Patient is a 55 y.o. female referred to outpatient physical therapy with a medical diagnosis of acute pain of left shoulder who presents with signs and symptoms consistent with left shoulder pain with likely contributions from cervical spine with neurodynamic tension in ulnar nerve distribution. Patient has significant pain with shoulder ROM, strength, and special test but unable to rule out cervical spine due to pain produced at shoulder and paresthesia with cervical spine motion/tests as well. Will continue to monitor and provide intervention to address both regions. 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  Patient presents with significant pain, ROM, joint stiffness, posture, motor control, muscle performance (strength/power/endurance), activity tolerance impairments that are limiting ability to complete  herusual activities that require use  of left UE including reaching for things with left hand, turning to the left, dressing, grooming, getting groceries,  cooking, cleaning, cross stitching, sleeping without difficulty. Patient will benefit from skilled physical therapy intervention to address current body structure impairments and activity limitations to improve function and work towards goals set in  current POC in order to return to prior level of function or maximal functional improvement.    Personal Factors and Comorbidities Age;Comorbidity 3+;Past/Current Experience;Fitness;Time since onset of injury/illness/exacerbation    Comorbidities Relevant past medical history and comorbidities include carpal tunnel syndrome (maybe undiagnosed left), CHF, Diabetes, hypertension, nonischemic cardiomyopathy, hx of pleural effusion left lung (2018), hx left heart cath and coronary angiography (2018), L foot fracture (fall Oct 2020) wears post op shoe. Patient denies hx of cancer, stroke, seizures, unexplained weight loss, changes in bowel or bladder problems, new onset stumbling or dropping things.    Examination-Activity Limitations Lift;Reach Overhead;Sleep;Dressing;Hygiene/Grooming;Bathing    Examination-Participation Restrictions Cleaning;Laundry;Meal Prep   difficulty with usual activities that require use of left UE including reaching for things with left hand, turning to the left, dressing, grooming, getting groceries,  cooking, cleaning, cross stitching, sleeping.   Stability/Clinical Decision Making Evolving/Moderate complexity    Clinical Decision Making Moderate    Rehab Potential Good    PT Frequency 2x / week    PT Duration 12 weeks    PT Treatment/Interventions ADLs/Self Care Home Management;Aquatic Therapy;Cryotherapy;Moist Heat;Electrical Stimulation;Therapeutic activities;Therapeutic exercise;Neuromuscular re-education;Patient/family education;Manual techniques;Passive range of motion;Dry needling;Spinal Manipulations;Joint Manipulations    PT Next Visit Plan manual therapy and exercise as tolerated, update HEP as appropriate    PT Home Exercise Plan medbridge  Access Code: [email or web address]    Consulted and Agree with Plan of Care Patient           Patient will benefit from skilled therapeutic intervention in order to improve the following deficits and impairments:  Improper body  mechanics,Pain,Decreased coordination,Postural dysfunction,Increased muscle spasms,Decreased activity tolerance,Decreased endurance,Decreased range of motion,Decreased strength,Hypomobility,Impaired perceived functional ability,Impaired UE functional use  Visit Diagnosis: Chronic left shoulder pain  Cervicalgia  Paresthesia of skin     Problem List Patient Active Problem List   Diagnosis Date Noted  . Acute pain of left shoulder 05/11/2020  . Pain due to onychomycosis of toenails of both feet 02/25/2020  . Rash and nonspecific skin eruption 11/26/2019  . Screening for malignant neoplasm of breast 08/20/2019  . Screening for osteoporosis 08/20/2019  . Fecal incontinence 01/23/2019  . NICM (nonischemic cardiomyopathy) ([place]) 08/22/2017  . CHF (congestive heart failure) ([place]) 04/03/2017  . Protein-calorie malnutrition, severe 11/02/2016  . Hypokalemia 04/28/2013  . Leukocytosis, unspecified 04/28/2013  . Uncontrolled type 2 diabetes mellitus with hyperglycemia ([place])   . Essential hypertension   . Hypercholesterolemia 11/10/2012    Everlean Alstrom. Graylon Good, PT, DPT 06/08/20, 1:37 PM  [facility] [address], [place], [address] Phone: [phone]   Fax:  [phone]  Name: Erin Hayes MRN: [id number] Date of Birth: [birth year]

## 2020-06-09 ENCOUNTER — Ambulatory Visit: Payer: Medicaid Other | Admitting: Podiatry

## 2020-06-15 ENCOUNTER — Ambulatory Visit: Payer: Medicaid Other | Admitting: Physical Therapy

## 2020-06-20 ENCOUNTER — Encounter: Payer: Self-pay | Admitting: Physical Therapy

## 2020-06-20 ENCOUNTER — Ambulatory Visit: Payer: Medicaid Other | Admitting: Physical Therapy

## 2020-06-20 ENCOUNTER — Other Ambulatory Visit: Payer: Self-pay

## 2020-06-20 DIAGNOSIS — M25512 Pain in left shoulder: Secondary | ICD-10-CM

## 2020-06-20 DIAGNOSIS — M542 Cervicalgia: Secondary | ICD-10-CM

## 2020-06-20 DIAGNOSIS — R202 Paresthesia of skin: Secondary | ICD-10-CM

## 2020-06-20 DIAGNOSIS — G8929 Other chronic pain: Secondary | ICD-10-CM

## 2020-06-20 NOTE — Therapy (Signed)
[facility] [address] [place], [address] Phone: [phone]   Fax:  [phone]  Physical Therapy Treatment  Patient Details  Name: Erin Hayes MRN: [id number] Date of Birth: March 29, 1966 Referring Provider (PT): Verl Bangs, FNP   Encounter Date: 06/20/2020   PT End of Session - 06/20/20 0941    Visit Number 2    Number of Visits 24    Date for PT Re-Evaluation 08/31/20    Authorization Type [employer] [place] reporting period from 06/08/2020    Authorization Time Period 27 PT/OT/ST visit per calendar year    Authorization - Visit Number 2    Authorization - Number of Visits 27    Progress Note Due on Visit 10    PT Start Time 0945    PT Stop Time 1025    PT Time Calculation (min) 40 min    Activity Tolerance Patient limited by pain    Behavior During Therapy [facility] for tasks assessed/performed           Past Medical History:  Diagnosis Date  . Carpal tunnel syndrome of right wrist   . Chronic combined systolic (congestive) and diastolic (congestive) heart failure ([place])    a. 03/2017 Echo: EF 20-25%, diff HK, Gr2 DD; b. 09/2017 Echo: EF 20-25%, diff HK, Gr2 DD.  . Diabetes ([place])   . GERD (gastroesophageal reflux disease)   . HLD (hyperlipidemia)   . Hypertension   . NICM (nonischemic cardiomyopathy) ([place])    a. 03/2017 Echo: EF 20-25%, diff HK, Gr2 DD, mild to mod MR, nl RV fxn;  b. 03/2017 Cath: mild nonobs dzs, EF 25%; c. 09/2017 Echo: EF 20-25%, diff HK. Gr2 DD. Mild MR. Mildly reduced RV fxn.  . Pleural effusion, left    a. 03/2017 s/p thoracentesis-->300 ml withdrawn--transudative.  . Pneumonia 02/19/2017    Past Surgical History:  Procedure Laterality Date  . COLONOSCOPY WITH PROPOFOL N/A 01/21/2017   Procedure: COLONOSCOPY WITH PROPOFOL;  Surgeon: Jonathon Bellows, MD;  Location: [facility] ENDOSCOPY;  Service: Gastroenterology;  Laterality: N/A;  . ESOPHAGOGASTRODUODENOSCOPY (EGD)  WITH PROPOFOL N/A 01/21/2017   Procedure: ESOPHAGOGASTRODUODENOSCOPY (EGD) WITH PROPOFOL;  Surgeon: Jonathon Bellows, MD;  Location: [facility] ENDOSCOPY;  Service: Gastroenterology;  Laterality: N/A;  . FLEXIBLE SIGMOIDOSCOPY N/A 11/04/2016   Procedure: FLEXIBLE SIGMOIDOSCOPY;  Surgeon: Wilford Corner, MD;  Location: [facility] ENDOSCOPY;  Service: Endoscopy;  Laterality: N/A;  . LEFT HEART CATH AND CORONARY ANGIOGRAPHY N/A 04/05/2017   Procedure: LEFT HEART CATH AND CORONARY ANGIOGRAPHY;  Surgeon: Wellington Hampshire, MD;  Location: [place] CV LAB;  Service: Cardiovascular;  Laterality: N/A;  . LITHOTRIPSY      There were no vitals filed for this visit.   Subjective Assessment - 06/20/20 0949    Subjective Pateint reports she is feeling okay today. She continues to have left shoulder pain when she moves it in the anterior shoulder. Rates her pain today as 5/10. She reports she has been doing her HEP about every day and they have been going okay. She does not have questions about any of them. Does notice that they make her pain any worse or better.    Pertinent History Patient is a 55 y.o. female who presents to outpatient physical therapy with a referral for medical diagnosis acute pain of left shoulder. This patient's chief complaints consist of left shoulder pain, leading to the following functional deficits: difficulty with usual activities that require use of left  UE including reaching for things with left hand, turning to the left, dressing, grooming, getting groceries,  cooking, cleaning, cross stitching, sleeping.  Relevant past medical history and comorbidities include carpal tunnel syndrome (maybe undiagnosed left), CHF, Diabetes, hypertension, nonischemic cardiomyopathy, hx of pleural effusion left lung (2018), hx left heart cath and coronary angiography (2018), L foot fracture (fall Oct 2020) wears shoe. Patient denies hx of cancer, stroke, seizures, unexplained weight loss, changes in bowel or bladder  problems, new onset stumbling or dropping things.    Limitations Lifting;House hold activities;Other (comment)   difficulty with usual activities that require use of left UE including reaching for things with left hand, turning to the left, dressing, grooming, getting groceries,  cooking, cleaning, cross stitching, sleeping.   Currently in Pain? Yes    Pain Score 5              TREATMENT:  Therapeutic exercise:to centralize symptoms and improve ROM, strength, muscular endurance, and activity tolerance required for successful completion of functional activities.  - Upper body ergometer with no added resistance encourage joint nutrition, warm tissue, induce analgesic effect of aerobic exercise, improve muscular strength and endurance,  and prepare for remainder of session. Changing directions every 1 min for 5 min total  (manual therapy - see below) - supine chin tuck x2x10 with 5 second hold plus extra reps with cuing. (reports increased concordant pain at shoulder each rep that resolved with rest).  - seated (on airex) thoracic extension over back of chair with hands clasped behind neck x10 (increasing concordant pain and uncomfortable to get in position).  - stand scapula row 2x10 with 5# cable, 1x10 with red theraband. Needed significant cuing at first but was able to reproduce acceptable form by lat set.  - Education on HEP including handout   Manual therapy: to reduce pain and tissue tension, improve range of motion, neuromodulation, in order to promote improved ability to complete functional activities. Prone position with pillow under hips for comfort:  - CPA and L UPA grade II-IV along cervical and upper thoracic spine. Tender at reproduction of concordant anterior shoulder pain with CPA and L UPA at ~ C6. Does not improve with continued reps.   Hooklying position:  - STM to posterior cervical spine musculature focusing in decreasing tension and pain. (tender, tight and with difficulty  relaxing with worst pain near C6 region).  - intermittent manual cervical spine traction during STM plus 3x30 seconds sustained (reports feels no pain).     HOME EXERCISE PROGRAM Access Code: [email or web address] URL: [email or web address] Date: 06/20/2020 Prepared by: [name]  Exercises Row with band/cable - 1 x daily - 3 sets - 10 reps - 1 second hold Circular Shoulder Pendulum with Table Support - 2 x daily - 20 reps Supine Chin Tuck - 2 x daily - 2 sets - 10 reps - 5 seconds hold    PT Education - 06/20/20 0952    Education Details Exercise purpose/form. Self management techniques    Person(s) Educated Patient    Methods Explanation;Demonstration;Tactile cues;Verbal cues    Comprehension Verbalized understanding;Returned demonstration;Verbal cues required;Tactile cues required;Need further instruction            PT Short Term Goals - 06/08/20 1332      PT SHORT TERM GOAL #1   Title Be independent with initial home exercise program for self-management of symptoms.    Baseline initial HEP provided at IE (06/08/20);    Time 3    Period Weeks  Status New    Target Date 06/29/20             PT Long Term Goals - 06/08/20 1332      PT LONG TERM GOAL #1   Title Be independent with a long-term home exercise program for self-management of symptoms.    Baseline initial HEP provided at IE (06/08/20);    Time 12    Period Weeks    Status New   TARGET DATE FOR ALL LONG TERM GOALS: 08/31/2020     PT LONG TERM GOAL #2   Title Demonstrate improved FOTO score to 63 by visit #11 to demonstrate improvement in overall condition and self-reported functional ability.    Baseline 41 (06/28/2020);    Time 12    Period Weeks    Status New      PT LONG TERM GOAL #3   Title Have full left shoulder AROM with no compensations or increase in pain in all planes except intermittent end range discomfort to allow patient to complete valued activities with less difficulty.    Baseline  limited and painfull - see objective exam (06/08/20);    Time 12    Period Weeks    Status New      PT LONG TERM GOAL #4   Title Improve left shoulder strength to 4+/5 with no increase in pain for improved ability to allow patient to complete valued functional tasks such as reaching overhead and crafting with less difficulty.    Baseline limited and painfull - see objective exam (06/08/20);    Time 12    Period Weeks    Status New      PT LONG TERM GOAL #5   Title Complete community, work and/or recreational activities without limitation due to current condition.    Baseline Functional Limitations: difficulty with usual activities that require use of left UE including reaching for things with left hand, turning to the left, dressing, grooming, getting groceries,  cooking, cleaning, cross stitching, sleeping (06/08/2020);    Time 12    Period Weeks    Status New                 Plan - 06/20/20 1038    Clinical Impression Statement Patient continues with concordant pain to the anterior left shoulder with pressure to the cervical spine, most localized at the left C6 region. No improvement with joint mobilizations to this region but cervical traction did not feel bad. Difficulty relaxing with STM to cervical spine musculature and patient holds head with significant upper cervical spine extension. No improvement with thoracic extension. Mild crepitus felt at left shoulder during rows but no complaint of pain. Continue to consider possibility of cervical and shoulder contributors. Patient admitted to not eating this morning and declined offer of crackers. Patient would benefit from continued management of limiting condition by skilled physical therapist to address remaining impairments and functional limitations to work towards stated goals and return to PLOF or maximal functional independence.    Personal Factors and Comorbidities Age;Comorbidity 3+;Past/Current Experience;Fitness;Time since onset  of injury/illness/exacerbation    Comorbidities Relevant past medical history and comorbidities include carpal tunnel syndrome (maybe undiagnosed left), CHF, Diabetes, hypertension, nonischemic cardiomyopathy, hx of pleural effusion left lung (2018), hx left heart cath and coronary angiography (2018), L foot fracture (fall Oct 2020) wears post op shoe. Patient denies hx of cancer, stroke, seizures, unexplained weight loss, changes in bowel or bladder problems, new onset stumbling or dropping things.  Examination-Activity Limitations Lift;Reach Overhead;Sleep;Dressing;Hygiene/Grooming;Bathing    Examination-Participation Restrictions Cleaning;Laundry;Meal Prep   difficulty with usual activities that require use of left UE including reaching for things with left hand, turning to the left, dressing, grooming, getting groceries,  cooking, cleaning, cross stitching, sleeping.   Stability/Clinical Decision Making Evolving/Moderate complexity    Rehab Potential Good    PT Frequency 2x / week    PT Duration 12 weeks    PT Treatment/Interventions ADLs/Self Care Home Management;Aquatic Therapy;Cryotherapy;Moist Heat;Electrical Stimulation;Therapeutic activities;Therapeutic exercise;Neuromuscular re-education;Patient/family education;Manual techniques;Passive range of motion;Dry needling;Spinal Manipulations;Joint Manipulations    PT Next Visit Plan manual therapy and exercise as tolerated, update HEP as appropriate    PT Home Exercise Plan medbridge  Access Code: [email or web address]    Consulted and Agree with Plan of Care Patient           Patient will benefit from skilled therapeutic intervention in order to improve the following deficits and impairments:  Improper body mechanics,Pain,Decreased coordination,Postural dysfunction,Increased muscle spasms,Decreased activity tolerance,Decreased endurance,Decreased range of motion,Decreased strength,Hypomobility,Impaired perceived functional ability,Impaired UE functional  use  Visit Diagnosis: Chronic left shoulder pain  Cervicalgia  Paresthesia of skin     Problem List Patient Active Problem List   Diagnosis Date Noted  . Acute pain of left shoulder 05/11/2020  . Pain due to onychomycosis of toenails of both feet 02/25/2020  . Rash and nonspecific skin eruption 11/26/2019  . Screening for malignant neoplasm of breast 08/20/2019  . Screening for osteoporosis 08/20/2019  . Fecal incontinence 01/23/2019  . NICM (nonischemic cardiomyopathy) ([place]) 08/22/2017  . CHF (congestive heart failure) ([place]) 04/03/2017  . Protein-calorie malnutrition, severe 11/02/2016  . Hypokalemia 04/28/2013  . Leukocytosis, unspecified 04/28/2013  . Uncontrolled type 2 diabetes mellitus with hyperglycemia ([place])   . Essential hypertension   . Hypercholesterolemia 11/10/2012   Everlean Alstrom. Graylon Good, PT, DPT 06/20/20, 10:40 AM  [place] [facility] [address] [place], [address] Phone: [phone]   Fax:  [phone]  Name: MOESHA SARCHET MRN: [id number] Date of Birth: April 11, 1966

## 2020-06-22 ENCOUNTER — Other Ambulatory Visit: Payer: Self-pay

## 2020-06-22 ENCOUNTER — Ambulatory Visit: Payer: Medicaid Other

## 2020-06-22 DIAGNOSIS — M542 Cervicalgia: Secondary | ICD-10-CM

## 2020-06-22 DIAGNOSIS — M25512 Pain in left shoulder: Secondary | ICD-10-CM

## 2020-06-22 DIAGNOSIS — R202 Paresthesia of skin: Secondary | ICD-10-CM

## 2020-06-22 DIAGNOSIS — G8929 Other chronic pain: Secondary | ICD-10-CM

## 2020-06-22 NOTE — Therapy (Signed)
[place] [facility] [address], [place], [address] Phone: [phone]   Fax:  [phone]  Physical Therapy Treatment  Patient Details  Name: Erin Hayes MRN: [id number] Date of Birth: 16-Jul-1965 Referring Provider (PT): Verl Bangs, FNP   Encounter Date: 06/22/2020   PT End of Session - 06/22/20 0959    Visit Number 3    Number of Visits 24    Date for PT Re-Evaluation 08/31/20    Authorization Type [place] reporting period from 06/08/2020    Authorization Time Period 27 PT/OT/ST visit per calendar year    Authorization - Visit Number 3    Authorization - Number of Visits 27    PT Start Time [phone]    PT Stop Time 1025    PT Time Calculation (min) 35 min           Past Medical History:  Diagnosis Date  . Carpal tunnel syndrome of right wrist   . Chronic combined systolic (congestive) and diastolic (congestive) heart failure ([place])    a. 03/2017 Echo: EF 20-25%, diff HK, Gr2 DD; b. 09/2017 Echo: EF 20-25%, diff HK, Gr2 DD.  . Diabetes ([place])   . GERD (gastroesophageal reflux disease)   . HLD (hyperlipidemia)   . Hypertension   . NICM (nonischemic cardiomyopathy) ([place])    a. 03/2017 Echo: EF 20-25%, diff HK, Gr2 DD, mild to mod MR, nl RV fxn;  b. 03/2017 Cath: mild nonobs dzs, EF 25%; c. 09/2017 Echo: EF 20-25%, diff HK. Gr2 DD. Mild MR. Mildly reduced RV fxn.  . Pleural effusion, left    a. 03/2017 s/p thoracentesis-->300 ml withdrawn--transudative.  . Pneumonia 02/19/2017    Past Surgical History:  Procedure Laterality Date  . COLONOSCOPY WITH PROPOFOL N/A 01/21/2017   Procedure: COLONOSCOPY WITH PROPOFOL;  Surgeon: Jonathon Bellows, MD;  Location: [facility] ENDOSCOPY;  Service: Gastroenterology;  Laterality: N/A;  . ESOPHAGOGASTRODUODENOSCOPY (EGD) WITH PROPOFOL N/A 01/21/2017   Procedure: ESOPHAGOGASTRODUODENOSCOPY (EGD) WITH PROPOFOL;  Surgeon: Jonathon Bellows, MD;  Location: [facility]  ENDOSCOPY;  Service: Gastroenterology;  Laterality: N/A;  . FLEXIBLE SIGMOIDOSCOPY N/A 11/04/2016   Procedure: FLEXIBLE SIGMOIDOSCOPY;  Surgeon: Wilford Corner, MD;  Location: [facility] ENDOSCOPY;  Service: Endoscopy;  Laterality: N/A;  . LEFT HEART CATH AND CORONARY ANGIOGRAPHY N/A 04/05/2017   Procedure: LEFT HEART CATH AND CORONARY ANGIOGRAPHY;  Surgeon: Wellington Hampshire, MD;  Location: [facility] CV LAB;  Service: Cardiovascular;  Laterality: N/A;  . LITHOTRIPSY      There were no vitals filed for this visit.   Subjective Assessment - 06/22/20 0958    Subjective Pt reports doing ok today, 5/10 pain in left shoulder no other updates.    Pertinent History Patient is a 55 y.o. female who presents to outpatient physical therapy with a referral for medical diagnosis acute pain of left shoulder. This patient's chief complaints consist of left shoulder pain, leading to the following functional deficits: difficulty with usual activities that require use of left UE including reaching for things with left hand, turning to the left, dressing, grooming, getting groceries,  cooking, cleaning, cross stitching, sleeping.  Relevant past medical history and comorbidities include carpal tunnel syndrome (maybe undiagnosed left), CHF, Diabetes, hypertension, nonischemic cardiomyopathy, hx of pleural effusion left lung (2018), hx left heart cath and coronary angiography (2018), L foot fracture (fall Oct 2020) wears shoe. Patient denies hx of cancer, stroke, seizures, unexplained weight loss, changes in bowel or bladder problems,  new onset stumbling or dropping things.    Currently in Pain? Yes    Pain Score 5     Pain Location --   Left anterior shoulder near attachment of subscapularis tendon on humerus          INTERVENTION THIS DATE  - Upper body ergometer, no resistance, AA/ROM x62min  Alternate direction Q60sec -seated scapular retraction 10x3secH  -seated scapular elevated 10x3secH  -seated flexion  table slides 10sec holds x 2 minutes; At end, Left shoulder P/ROM: 145 degrees  -seated scaption table slides 10sec holds x 2 minutes; -seated LUE GHJ ER AA/ROM c wand, elbow supported on table at 75 degrees shoudler, forearm perpendicular to gravity at start.  - standing scapula row 2x10 with 5# cable -standing Left GHJ IR x YTB 2x10, elbow at side -standing Left GHJ eR x YTB 2x10, elbow at side   PT Short Term Goals - 06/08/20 1332      PT SHORT TERM GOAL #1   Title Be independent with initial home exercise program for self-management of symptoms.    Baseline initial HEP provided at IE (06/08/20);    Time 3    Period Weeks    Status New    Target Date 06/29/20             PT Long Term Goals - 06/08/20 1332      PT LONG TERM GOAL #1   Title Be independent with a long-term home exercise program for self-management of symptoms.    Baseline initial HEP provided at IE (06/08/20);    Time 12    Period Weeks    Status New   TARGET DATE FOR ALL LONG TERM GOALS: 08/31/2020     PT LONG TERM GOAL #2   Title Demonstrate improved FOTO score to 63 by visit #11 to demonstrate improvement in overall condition and self-reported functional ability.    Baseline 41 (06/28/2020);    Time 12    Period Weeks    Status New      PT LONG TERM GOAL #3   Title Have full left shoulder AROM with no compensations or increase in pain in all planes except intermittent end range discomfort to allow patient to complete valued activities with less difficulty.    Baseline limited and painfull - see objective exam (06/08/20);    Time 12    Period Weeks    Status New      PT LONG TERM GOAL #4   Title Improve left shoulder strength to 4+/5 with no increase in pain for improved ability to allow patient to complete valued functional tasks such as reaching overhead and crafting with less difficulty.    Baseline limited and painfull - see objective exam (06/08/20);    Time 12    Period Weeks    Status New      PT  LONG TERM GOAL #5   Title Complete community, work and/or recreational activities without limitation due to current condition.    Baseline Functional Limitations: difficulty with usual activities that require use of left UE including reaching for things with left hand, turning to the left, dressing, grooming, getting groceries,  cooking, cleaning, cross stitching, sleeping (06/08/2020);    Time 12    Period Weeks    Status New                 Plan - 06/22/20 1000    Clinical Impression Statement Continued with current plan of care as laid out  in evaluation and recent prior sessions. Pt remains motivated to advance progress toward goals. Pain and ROM both notably improved this date compared ot previous sessions. Author maintains all interventions within appropriate level of intensity as not to purposefully exacerbate pain. Focus maintained on gentle ROM of shoulder low level loading resistance training. Pt does require varying levels of assistance and cuing for completion of exercises for correct form and sometimes due to pain/weakness. Pt continues to demonstrate progress toward goals AEB progression of some interventions this date either in volume or intensity. No updates to HEP this date.    Personal Factors and Comorbidities Comorbidity 2    Comorbidities Relevant past medical history and comorbidities include carpal tunnel syndrome (maybe undiagnosed left), CHF, Diabetes, hypertension, nonischemic cardiomyopathy, hx of pleural effusion left lung (2018), hx left heart cath and coronary angiography (2018), L foot fracture (fall Oct 2020) wears post op shoe. Patient denies hx of cancer, stroke, seizures, unexplained weight loss, changes in bowel or bladder problems, new onset stumbling or dropping things.    Examination-Activity Limitations Caring for Others    Examination-Participation Restrictions Cleaning;Laundry;Meal Prep    Stability/Clinical Decision Making Evolving/Moderate complexity     Clinical Decision Making Moderate    Rehab Potential Poor    PT Frequency 2x / week    PT Duration 12 weeks    PT Treatment/Interventions ADLs/Self Care Home Management;Aquatic Therapy;Cryotherapy;Moist Heat;Electrical Stimulation;Therapeutic activities;Therapeutic exercise;Neuromuscular re-education;Patient/family education;Manual techniques;Passive range of motion;Dry needling;Spinal Manipulations;Joint Manipulations    PT Next Visit Plan manual therapy and exercise as tolerated, update HEP as appropriate    PT Home Exercise Plan medbridge  Access Code: [email or web address]    Consulted and Agree with Plan of Care Patient           Patient will benefit from skilled therapeutic intervention in order to improve the following deficits and impairments:  Improper body mechanics,Pain,Decreased coordination,Postural dysfunction,Increased muscle spasms,Decreased activity tolerance,Decreased endurance,Decreased range of motion,Decreased strength,Hypomobility,Impaired perceived functional ability,Impaired UE functional use  Visit Diagnosis: Chronic left shoulder pain  Cervicalgia  Paresthesia of skin     Problem List Patient Active Problem List   Diagnosis Date Noted  . Acute pain of left shoulder 05/11/2020  . Pain due to onychomycosis of toenails of both feet 02/25/2020  . Rash and nonspecific skin eruption 11/26/2019  . Screening for malignant neoplasm of breast 08/20/2019  . Screening for osteoporosis 08/20/2019  . Fecal incontinence 01/23/2019  . NICM (nonischemic cardiomyopathy) ([place]) 08/22/2017  . CHF (congestive heart failure) ([place]) 04/03/2017  . Protein-calorie malnutrition, severe 11/02/2016  . Hypokalemia 04/28/2013  . Leukocytosis, unspecified 04/28/2013  . Uncontrolled type 2 diabetes mellitus with hyperglycemia ([place])   . Essential hypertension   . Hypercholesterolemia 11/10/2012   10:28 AM, 06/22/20 Etta Grandchild, PT, DPT Physical Therapist - [place] [phone]  (Office)    Buccola,Allan C 06/22/2020, 10:01 AM  [place] [facility] [address] [place], [address] Phone: [phone]   Fax:  [phone]  Name: Erin Hayes MRN: [id number] Date of Birth: 11-14-1965

## 2020-06-27 ENCOUNTER — Ambulatory Visit: Payer: Medicaid Other

## 2020-06-27 ENCOUNTER — Other Ambulatory Visit: Payer: Self-pay

## 2020-06-27 DIAGNOSIS — M542 Cervicalgia: Secondary | ICD-10-CM

## 2020-06-27 DIAGNOSIS — G8929 Other chronic pain: Secondary | ICD-10-CM

## 2020-06-27 DIAGNOSIS — M25512 Pain in left shoulder: Secondary | ICD-10-CM | POA: Diagnosis not present

## 2020-06-27 DIAGNOSIS — R202 Paresthesia of skin: Secondary | ICD-10-CM

## 2020-06-27 NOTE — Therapy (Signed)
[facility] [address] [place], [address] Phone: [phone]   Fax:  [phone]  Physical Therapy Treatment  Patient Details  Name: Erin Hayes MRN: [id number] Date of Birth: November 16, 1965 Referring Provider (PT): Verl Bangs, FNP   Encounter Date: 06/27/2020   PT End of Session - 06/27/20 1012    Visit Number 4    Number of Visits 24    Date for PT Re-Evaluation 08/31/20    Authorization Type [facility] [place] [facility] reporting period from 06/08/2020    Authorization Time Period 27 PT/OT/ST visit per calendar year    Authorization - Visit Number 3    Authorization - Number of Visits 27    PT Start Time 0945    PT Stop Time 1030    PT Time Calculation (min) 45 min           Past Medical History:  Diagnosis Date  . Carpal tunnel syndrome of right wrist   . Chronic combined systolic (congestive) and diastolic (congestive) heart failure ([place])    a. 03/2017 Echo: EF 20-25%, diff HK, Gr2 DD; b. 09/2017 Echo: EF 20-25%, diff HK, Gr2 DD.  . Diabetes ([place])   . GERD (gastroesophageal reflux disease)   . HLD (hyperlipidemia)   . Hypertension   . NICM (nonischemic cardiomyopathy) ([place])    a. 03/2017 Echo: EF 20-25%, diff HK, Gr2 DD, mild to mod MR, nl RV fxn;  b. 03/2017 Cath: mild nonobs dzs, EF 25%; c. 09/2017 Echo: EF 20-25%, diff HK. Gr2 DD. Mild MR. Mildly reduced RV fxn.  . Pleural effusion, left    a. 03/2017 s/p thoracentesis-->300 ml withdrawn--transudative.  . Pneumonia 02/19/2017    Past Surgical History:  Procedure Laterality Date  . COLONOSCOPY WITH PROPOFOL N/A 01/21/2017   Procedure: COLONOSCOPY WITH PROPOFOL;  Surgeon: Jonathon Bellows, MD;  Location: [facility] ENDOSCOPY;  Service: Gastroenterology;  Laterality: N/A;  . ESOPHAGOGASTRODUODENOSCOPY (EGD) WITH PROPOFOL N/A 01/21/2017   Procedure: ESOPHAGOGASTRODUODENOSCOPY (EGD) WITH PROPOFOL;  Surgeon: Jonathon Bellows, MD;  Location: [facility]  ENDOSCOPY;  Service: Gastroenterology;  Laterality: N/A;  . FLEXIBLE SIGMOIDOSCOPY N/A 11/04/2016   Procedure: FLEXIBLE SIGMOIDOSCOPY;  Surgeon: Wilford Corner, MD;  Location: [facility] ENDOSCOPY;  Service: Endoscopy;  Laterality: N/A;  . LEFT HEART CATH AND CORONARY ANGIOGRAPHY N/A 04/05/2017   Procedure: LEFT HEART CATH AND CORONARY ANGIOGRAPHY;  Surgeon: Wellington Hampshire, MD;  Location: [facility] CV LAB;  Service: Cardiovascular;  Laterality: N/A;  . LITHOTRIPSY      There were no vitals filed for this visit.   Subjective Assessment - 06/27/20 1007    Subjective Patient states her shoulder is feeling better today, conitnues to have pain with laying on the affected side.    Pertinent History Patient is a 55 y.o. female who presents to outpatient physical therapy with a referral for medical diagnosis acute pain of left shoulder. This patient's chief complaints consist of left shoulder pain, leading to the following functional deficits: difficulty with usual activities that require use of left UE including reaching for things with left hand, turning to the left, dressing, grooming, getting groceries,  cooking, cleaning, cross stitching, sleeping.  Relevant past medical history and comorbidities include carpal tunnel syndrome (maybe undiagnosed left), CHF, Diabetes, hypertension, nonischemic cardiomyopathy, hx of pleural effusion left lung (2018), hx left heart cath and coronary angiography (2018), L foot fracture (fall Oct 2020) wears shoe. Patient denies hx of cancer, stroke, seizures, unexplained weight loss, changes  in bowel or bladder problems, new onset stumbling or dropping things.    Currently in Pain? No/denies             INTERVENTION THIS DATE   -Seated scapular retraction - x 10 -Standing scapular retraction arms behind back - 2 x 10  -Standing median nerve glide - 2 x 10  -Supine median nerve glide PROM - x 10  -Seated LUE GHJ ER AROM, elbow supported on table at 75 degrees  shoudler, forearm perpendicular to gravity at start - 2 x 10  -Standing shoulder ER B with YTB - 2 x 15 -Standing IR weight passes behind back - 2 x15 4# db - UBE in sitting no resistance performed for 3.5 min   Performed exercises to address pain and spasms      PT Education - 06/27/20 1011    Education Details form/technique with exercise; added median nerve glides to HEP    Person(s) Educated Patient    Methods Explanation;Demonstration    Comprehension Verbalized understanding;Returned demonstration            PT Short Term Goals - 06/08/20 1332      PT SHORT TERM GOAL #1   Title Be independent with initial home exercise program for self-management of symptoms.    Baseline initial HEP provided at IE (06/08/20);    Time 3    Period Weeks    Status New    Target Date 06/29/20             PT Long Term Goals - 06/08/20 1332      PT LONG TERM GOAL #1   Title Be independent with a long-term home exercise program for self-management of symptoms.    Baseline initial HEP provided at IE (06/08/20);    Time 12    Period Weeks    Status New   TARGET DATE FOR ALL LONG TERM GOALS: 08/31/2020     PT LONG TERM GOAL #2   Title Demonstrate improved FOTO score to 63 by visit #11 to demonstrate improvement in overall condition and self-reported functional ability.    Baseline 41 (06/28/2020);    Time 12    Period Weeks    Status New      PT LONG TERM GOAL #3   Title Have full left shoulder AROM with no compensations or increase in pain in all planes except intermittent end range discomfort to allow patient to complete valued activities with less difficulty.    Baseline limited and painfull - see objective exam (06/08/20);    Time 12    Period Weeks    Status New      PT LONG TERM GOAL #4   Title Improve left shoulder strength to 4+/5 with no increase in pain for improved ability to allow patient to complete valued functional tasks such as reaching overhead and crafting with less  difficulty.    Baseline limited and painfull - see objective exam (06/08/20);    Time 12    Period Weeks    Status New      PT LONG TERM GOAL #5   Title Complete community, work and/or recreational activities without limitation due to current condition.    Baseline Functional Limitations: difficulty with usual activities that require use of left UE including reaching for things with left hand, turning to the left, dressing, grooming, getting groceries,  cooking, cleaning, cross stitching, sleeping (06/08/2020);    Time 12    Period Weeks    Status  New                 Plan - 06/27/20 1013    Clinical Impression Statement Improvement in symtpoms after performing median nerve glides, indicating median nerve irratation. Gave Median nerve glides as exercises to be completed at home to be performed thoughout greater pain-free AROM. Patient does demonstrate limited shoulder IR through functional range, worse on L vs R; address this throughout session today. Patient will benefit from further skilled therapy focused on improving limitation to return to prior level of function.    Personal Factors and Comorbidities Comorbidity 2    Comorbidities Relevant past medical history and comorbidities include carpal tunnel syndrome (maybe undiagnosed left), CHF, Diabetes, hypertension, nonischemic cardiomyopathy, hx of pleural effusion left lung (2018), hx left heart cath and coronary angiography (2018), L foot fracture (fall Oct 2020) wears post op shoe. Patient denies hx of cancer, stroke, seizures, unexplained weight loss, changes in bowel or bladder problems, new onset stumbling or dropping things.    Examination-Activity Limitations Caring for Others    Examination-Participation Restrictions Cleaning;Laundry;Meal Prep    Stability/Clinical Decision Making Evolving/Moderate complexity    Rehab Potential Poor    PT Frequency 2x / week    PT Duration 12 weeks    PT Treatment/Interventions ADLs/Self Care  Home Management;Aquatic Therapy;Cryotherapy;Moist Heat;Electrical Stimulation;Therapeutic activities;Therapeutic exercise;Neuromuscular re-education;Patient/family education;Manual techniques;Passive range of motion;Dry needling;Spinal Manipulations;Joint Manipulations    PT Next Visit Plan manual therapy and exercise as tolerated, update HEP as appropriate    PT Home Exercise Plan [email or web address]  Access Code: [email or web address]    Consulted and Agree with Plan of Care Patient           Patient will benefit from skilled therapeutic intervention in order to improve the following deficits and impairments:  Improper body mechanics,Pain,Decreased coordination,Postural dysfunction,Increased muscle spasms,Decreased activity tolerance,Decreased endurance,Decreased range of motion,Decreased strength,Hypomobility,Impaired perceived functional ability,Impaired UE functional use  Visit Diagnosis: Chronic left shoulder pain  Cervicalgia  Paresthesia of skin     Problem List Patient Active Problem List   Diagnosis Date Noted  . Acute pain of left shoulder 05/11/2020  . Pain due to onychomycosis of toenails of both feet 02/25/2020  . Rash and nonspecific skin eruption 11/26/2019  . Screening for malignant neoplasm of breast 08/20/2019  . Screening for osteoporosis 08/20/2019  . Fecal incontinence 01/23/2019  . NICM (nonischemic cardiomyopathy) ([place]) 08/22/2017  . CHF (congestive heart failure) ([place]) 04/03/2017  . Protein-calorie malnutrition, severe 11/02/2016  . Hypokalemia 04/28/2013  . Leukocytosis, unspecified 04/28/2013  . Uncontrolled type 2 diabetes mellitus with hyperglycemia ([place])   . Essential hypertension   . Hypercholesterolemia 11/10/2012    Blythe Stanford, PT DPT 06/27/2020, 10:23 AM  [facility] [address] [place], [address] Phone: [phone]   Fax:  [phone]  Name: Erin Hayes MRN: [id number] Date of  Birth: 27-Apr-1966

## 2020-06-29 ENCOUNTER — Other Ambulatory Visit: Payer: Self-pay

## 2020-06-29 ENCOUNTER — Encounter: Payer: Self-pay | Admitting: Physical Therapy

## 2020-06-29 ENCOUNTER — Ambulatory Visit: Payer: Medicaid Other

## 2020-06-29 DIAGNOSIS — M542 Cervicalgia: Secondary | ICD-10-CM

## 2020-06-29 DIAGNOSIS — M25512 Pain in left shoulder: Secondary | ICD-10-CM

## 2020-06-29 DIAGNOSIS — G8929 Other chronic pain: Secondary | ICD-10-CM

## 2020-06-29 DIAGNOSIS — R202 Paresthesia of skin: Secondary | ICD-10-CM

## 2020-06-29 NOTE — Therapy (Signed)
[facility] [address], [place], [address] Phone: [phone]   Fax:  [phone]  Physical Therapy Treatment  Patient Details  Name: Erin Hayes MRN: [id number] Date of Birth: [birth year] Referring Provider (PT): Verl Bangs, FNP   Encounter Date: 06/29/2020   PT End of Session - 06/29/20 1019    Visit Number 5    Number of Visits 24    Date for PT Re-Evaluation 08/31/20    Authorization Type [place] reporting period from 06/08/2020    Authorization Time Period 27 PT/OT/ST visit per calendar year    Authorization - Visit Number 4    Authorization - Number of Visits 27    PT Start Time 0945    PT Stop Time 1020    PT Time Calculation (min) 35 min    Activity Tolerance Patient limited by pain    Behavior During Therapy [facility] for tasks assessed/performed           Past Medical History:  Diagnosis Date  . Carpal tunnel syndrome of right wrist   . Chronic combined systolic (congestive) and diastolic (congestive) heart failure ([place])    a. 03/2017 Echo: EF 20-25%, diff HK, Gr2 DD; b. 09/2017 Echo: EF 20-25%, diff HK, Gr2 DD.  . Diabetes ([place])   . GERD (gastroesophageal reflux disease)   . HLD (hyperlipidemia)   . Hypertension   . NICM (nonischemic cardiomyopathy) ([place])    a. 03/2017 Echo: EF 20-25%, diff HK, Gr2 DD, mild to mod MR, nl RV fxn;  b. 03/2017 Cath: mild nonobs dzs, EF 25%; c. 09/2017 Echo: EF 20-25%, diff HK. Gr2 DD. Mild MR. Mildly reduced RV fxn.  . Pleural effusion, left    a. 03/2017 s/p thoracentesis-->300 ml withdrawn--transudative.  . Pneumonia 02/19/2017    Past Surgical History:  Procedure Laterality Date  . COLONOSCOPY WITH PROPOFOL N/A 01/21/2017   Procedure: COLONOSCOPY WITH PROPOFOL;  Surgeon: Jonathon Bellows, MD;  Location: [facility] ENDOSCOPY;  Service: Gastroenterology;  Laterality: N/A;  . ESOPHAGOGASTRODUODENOSCOPY (EGD) WITH PROPOFOL N/A 01/21/2017    Procedure: ESOPHAGOGASTRODUODENOSCOPY (EGD) WITH PROPOFOL;  Surgeon: Jonathon Bellows, MD;  Location: [facility] ENDOSCOPY;  Service: Gastroenterology;  Laterality: N/A;  . FLEXIBLE SIGMOIDOSCOPY N/A 11/04/2016   Procedure: FLEXIBLE SIGMOIDOSCOPY;  Surgeon: Wilford Corner, MD;  Location: [facility] ENDOSCOPY;  Service: Endoscopy;  Laterality: N/A;  . LEFT HEART CATH AND CORONARY ANGIOGRAPHY N/A 04/05/2017   Procedure: LEFT HEART CATH AND CORONARY ANGIOGRAPHY;  Surgeon: Wellington Hampshire, MD;  Location: [place] CV LAB;  Service: Cardiovascular;  Laterality: N/A;  . LITHOTRIPSY      There were no vitals filed for this visit.   Subjective Assessment - 06/29/20 0946    Subjective Patient reported her shoulder is more painful today, started yesterday. Cannot think of anything in particular that may have flared it up. Pain is about the same compared to yesterday.    Pertinent History Patient is a 55 y.o. female who presents to outpatient physical therapy with a referral for medical diagnosis acute pain of left shoulder. This patient's chief complaints consist of left shoulder pain, leading to the following functional deficits: difficulty with usual activities that require use of left UE including reaching for things with left hand, turning to the left, dressing, grooming, getting groceries,  cooking, cleaning, cross stitching, sleeping.  Relevant past medical history and comorbidities include carpal tunnel syndrome (maybe undiagnosed left), CHF, Diabetes, hypertension, nonischemic cardiomyopathy, hx of pleural  effusion left lung (2018), hx left heart cath and coronary angiography (2018), L foot fracture (fall Oct 2020) wears shoe. Patient denies hx of cancer, stroke, seizures, unexplained weight loss, changes in bowel or bladder problems, new onset stumbling or dropping things.    Limitations Lifting;House hold activities;Other (comment)    Currently in Pain? Yes    Pain Score 8     Pain Location Shoulder    Pain  Orientation Left    Pain Descriptors / Indicators Aching             INTERVENTION THIS DATE    -Seated scapular retraction - x 10   -standing scapular retraction - x 10   -Standing scapular retraction arms behind back - 2 x 10    -Standing median nerve glide - 2 x 10    -Supine median nerve glide PROM - 2 x 10    -Seated LUE GHJ ER AROM, elbow supported on table at 75 degrees shoudler, forearm perpendicular to gravity at start - 2 x 10    -seated towel slide flexion, scaption 2x10  -Standing shoulder ER B with YTB - 2 x 15   -Standing IR weight passes behind back - 2 x15 4# db   -seated scapular retractions with YTB 3x10 cueing for form   Manual:  x58mins GH AP joint mobs grade II 4x30sec bouts     pt response/clinical impression: Patient reported decreased L shoulder pain with L shoulder mobilizations. Able to complete exercises as planned with pain exacerbation with nerve glides, but pain resolved to baseline levels this session with rest. The patient would benefit from further skilled PT intervention to continue to progress towards goals and improve QOL.        PT Education - 06/29/20 0947    Education Details exercise form technique/form    Person(s) Educated Patient    Methods Explanation;Demonstration    Comprehension Returned demonstration;Need further instruction;Verbalized understanding            PT Short Term Goals - 06/08/20 1332      PT SHORT TERM GOAL #1   Title Be independent with initial home exercise program for self-management of symptoms.    Baseline initial HEP provided at IE (06/08/20);    Time 3    Period Weeks    Status New    Target Date 06/29/20             PT Long Term Goals - 06/08/20 1332      PT LONG TERM GOAL #1   Title Be independent with a long-term home exercise program for self-management of symptoms.    Baseline initial HEP provided at IE (06/08/20);    Time 12    Period Weeks    Status New   TARGET DATE FOR ALL  LONG TERM GOALS: 08/31/2020     PT LONG TERM GOAL #2   Title Demonstrate improved FOTO score to 63 by visit #11 to demonstrate improvement in overall condition and self-reported functional ability.    Baseline 41 (06/28/2020);    Time 12    Period Weeks    Status New      PT LONG TERM GOAL #3   Title Have full left shoulder AROM with no compensations or increase in pain in all planes except intermittent end range discomfort to allow patient to complete valued activities with less difficulty.    Baseline limited and painfull - see objective exam (06/08/20);    Time 12  Period Weeks    Status New      PT LONG TERM GOAL #4   Title Improve left shoulder strength to 4+/5 with no increase in pain for improved ability to allow patient to complete valued functional tasks such as reaching overhead and crafting with less difficulty.    Baseline limited and painfull - see objective exam (06/08/20);    Time 12    Period Weeks    Status New      PT LONG TERM GOAL #5   Title Complete community, work and/or recreational activities without limitation due to current condition.    Baseline Functional Limitations: difficulty with usual activities that require use of left UE including reaching for things with left hand, turning to the left, dressing, grooming, getting groceries,  cooking, cleaning, cross stitching, sleeping (06/08/2020);    Time 12    Period Weeks    Status New                 Plan - 06/29/20 1018    Clinical Impression Statement Patient reported decreased L shoulder pain with L shoulder mobilizations. Able to complete exercises as planned with pain exacerbation with nerve glides, but pain resolved to baseline levels this session with rest. The patient would benefit from further skilled PT intervention to continue to progress towards goals and improve QOL.    Personal Factors and Comorbidities Comorbidity 2    Comorbidities Relevant past medical history and comorbidities include  carpal tunnel syndrome (maybe undiagnosed left), CHF, Diabetes, hypertension, nonischemic cardiomyopathy, hx of pleural effusion left lung (2018), hx left heart cath and coronary angiography (2018), L foot fracture (fall Oct 2020) wears post op shoe. Patient denies hx of cancer, stroke, seizures, unexplained weight loss, changes in bowel or bladder problems, new onset stumbling or dropping things.    Examination-Activity Limitations Caring for Others    Examination-Participation Restrictions Cleaning;Laundry;Meal Prep    Stability/Clinical Decision Making Evolving/Moderate complexity    Rehab Potential Poor    PT Frequency 2x / week    PT Duration 12 weeks    PT Treatment/Interventions ADLs/Self Care Home Management;Aquatic Therapy;Cryotherapy;Moist Heat;Electrical Stimulation;Therapeutic activities;Therapeutic exercise;Neuromuscular re-education;Patient/family education;Manual techniques;Passive range of motion;Dry needling;Spinal Manipulations;Joint Manipulations    PT Next Visit Plan manual therapy and exercise as tolerated, update HEP as appropriate    PT Home Exercise Plan [email or web address]  Access Code: [email or web address]    Consulted and Agree with Plan of Care Patient           Patient will benefit from skilled therapeutic intervention in order to improve the following deficits and impairments:  Improper body mechanics,Pain,Decreased coordination,Postural dysfunction,Increased muscle spasms,Decreased activity tolerance,Decreased endurance,Decreased range of motion,Decreased strength,Hypomobility,Impaired perceived functional ability,Impaired UE functional use  Visit Diagnosis: Chronic left shoulder pain  Cervicalgia  Paresthesia of skin     Problem List Patient Active Problem List   Diagnosis Date Noted  . Acute pain of left shoulder 05/11/2020  . Pain due to onychomycosis of toenails of both feet 02/25/2020  . Rash and nonspecific skin eruption 11/26/2019  . Screening for malignant neoplasm  of breast 08/20/2019  . Screening for osteoporosis 08/20/2019  . Fecal incontinence 01/23/2019  . NICM (nonischemic cardiomyopathy) ([place]) 08/22/2017  . CHF (congestive heart failure) ([place]) 04/03/2017  . Protein-calorie malnutrition, severe 11/02/2016  . Hypokalemia 04/28/2013  . Leukocytosis, unspecified 04/28/2013  . Uncontrolled type 2 diabetes mellitus with hyperglycemia ([place])   . Essential hypertension   . Hypercholesterolemia 11/10/2012    Lieutenant Diego PT, DPT 10:25  AM,06/29/20   [facility] [address], [place], [address] Phone: [phone]   Fax:  [phone]  Name: Erin Hayes MRN: [id number] Date of Birth: July 25, 1965

## 2020-07-04 ENCOUNTER — Ambulatory Visit: Payer: Medicaid Other | Admitting: Physical Therapy

## 2020-07-04 ENCOUNTER — Encounter: Payer: Self-pay | Admitting: Physical Therapy

## 2020-07-04 ENCOUNTER — Other Ambulatory Visit: Payer: Self-pay

## 2020-07-04 DIAGNOSIS — M542 Cervicalgia: Secondary | ICD-10-CM

## 2020-07-04 DIAGNOSIS — G8929 Other chronic pain: Secondary | ICD-10-CM

## 2020-07-04 DIAGNOSIS — M25512 Pain in left shoulder: Secondary | ICD-10-CM | POA: Diagnosis not present

## 2020-07-04 DIAGNOSIS — R202 Paresthesia of skin: Secondary | ICD-10-CM

## 2020-07-04 NOTE — Therapy (Signed)
[facility] [address], [place], [address] Phone: [phone]   Fax:  [phone]  Physical Therapy Treatment  Patient Details  Name: Erin Hayes MRN: [id number] Date of Birth: 1966-04-10 Referring Provider (PT): Verl Bangs, FNP   Encounter Date: 07/04/2020   PT End of Session - 07/04/20 0955    Visit Number 6    Number of Visits 24    Date for PT Re-Evaluation 08/31/20    Authorization Type [place] reporting period from 06/08/2020    Authorization Time Period 27 PT/OT/ST visit per calendar year    Authorization - Visit Number 5    Authorization - Number of Visits 27    Progress Note Due on Visit 10    PT Start Time [phone]    PT Stop Time 1027    PT Time Calculation (min) 40 min    Activity Tolerance Patient limited by pain    Behavior During Therapy [facility] for tasks assessed/performed           Past Medical History:  Diagnosis Date  . Carpal tunnel syndrome of right wrist   . Chronic combined systolic (congestive) and diastolic (congestive) heart failure ([place])    a. 03/2017 Echo: EF 20-25%, diff HK, Gr2 DD; b. 09/2017 Echo: EF 20-25%, diff HK, Gr2 DD.  . Diabetes ([place])   . GERD (gastroesophageal reflux disease)   . HLD (hyperlipidemia)   . Hypertension   . NICM (nonischemic cardiomyopathy) ([place])    a. 03/2017 Echo: EF 20-25%, diff HK, Gr2 DD, mild to mod MR, nl RV fxn;  b. 03/2017 Cath: mild nonobs dzs, EF 25%; c. 09/2017 Echo: EF 20-25%, diff HK. Gr2 DD. Mild MR. Mildly reduced RV fxn.  . Pleural effusion, left    a. 03/2017 s/p thoracentesis-->300 ml withdrawn--transudative.  . Pneumonia 02/19/2017    Past Surgical History:  Procedure Laterality Date  . COLONOSCOPY WITH PROPOFOL N/A 01/21/2017   Procedure: COLONOSCOPY WITH PROPOFOL;  Surgeon: Jonathon Bellows, MD;  Location: [facility] ENDOSCOPY;  Service: Gastroenterology;  Laterality: N/A;  . ESOPHAGOGASTRODUODENOSCOPY (EGD)  WITH PROPOFOL N/A 01/21/2017   Procedure: ESOPHAGOGASTRODUODENOSCOPY (EGD) WITH PROPOFOL;  Surgeon: Jonathon Bellows, MD;  Location: [facility] ENDOSCOPY;  Service: Gastroenterology;  Laterality: N/A;  . FLEXIBLE SIGMOIDOSCOPY N/A 11/04/2016   Procedure: FLEXIBLE SIGMOIDOSCOPY;  Surgeon: Wilford Corner, MD;  Location: [facility] ENDOSCOPY;  Service: Endoscopy;  Laterality: N/A;  . LEFT HEART CATH AND CORONARY ANGIOGRAPHY N/A 04/05/2017   Procedure: LEFT HEART CATH AND CORONARY ANGIOGRAPHY;  Surgeon: Wellington Hampshire, MD;  Location: [place] CV LAB;  Service: Cardiovascular;  Laterality: N/A;  . LITHOTRIPSY      There were no vitals filed for this visit.   Subjective Assessment - 07/04/20 0951    Subjective Patient reports no pain upon arrival today. States her shoulder is "about the same" overall. States last time she had pain was 8/10 in the anterir L shoulder after cleaning (low position) yesterday but it did not take a long time to resolve. She states her HEP is going well with no diffiulties and she felt okay folloiwng last treatment session.    Pertinent History Patient is a 55 y.o. female who presents to outpatient physical therapy with a referral for medical diagnosis acute pain of left shoulder. This patient's chief complaints consist of left shoulder pain, leading to the following functional deficits: difficulty with usual activities that require use of left UE including reaching for  things with left hand, turning to the left, dressing, grooming, getting groceries,  cooking, cleaning, cross stitching, sleeping.  Relevant past medical history and comorbidities include carpal tunnel syndrome (maybe undiagnosed left), CHF, Diabetes, hypertension, nonischemic cardiomyopathy, hx of pleural effusion left lung (2018), hx left heart cath and coronary angiography (2018), L foot fracture (fall Oct 2020) wears shoe. Patient denies hx of cancer, stroke, seizures, unexplained weight loss, changes in bowel or bladder  problems, new onset stumbling or dropping things.    Limitations Lifting;House hold activities;Other (comment)    Currently in Pain? No/denies            TREATMENT:   Therapeutic exercise: to centralize symptoms and improve ROM, strength, muscular endurance, and activity tolerance required for successful completion of functional activities.  -Seated scapular retraction - x 10  -standing scapular retraction - x 10  -Standing scapular retraction arms behind back - 2 x 10   -Standing median nerve glide - 2 x 10 (shoulder motion with fingers to floor).    -Seated LUE GHJ ER AROM, elbow supported on table at 75 degrees shoudler, forearm perpendicular to gravity at start - 2 x 10   -seated towel slide flexion, scaption 2x10 - Education on HEP including handout    Manual therapy: to reduce pain and tissue tension, improve range of motion, neuromodulation, in order to promote improved ability to complete functional activities. SUPINE POSITION - Intermittent Cervical spine distraction with STM to posterior cervical spine musculature and B SCM.  -Supine PROM median nerve glide - 2 x 10   - x60mins GH AP joint mobs grade II 4x30sec bouts - STM to L posterior rotator cuff (produces concordant pain ant shoulder).   Access Code: [email or web address] URL: [email or web address] Date: 07/04/2020 Prepared by: [name]  Exercises Row with band/cable - 1 x daily - 3 sets - 10 reps - 1 second hold Standing Median Nerve Glide - 1 x daily - 3 sets - 10 reps Supine Deep Neck Flexor Training - Repetitions - 2 sets - 10 reps - 1-5 seconds hold      PT Education - 07/04/20 0955    Education Details exercise form technique/form    Person(s) Educated Patient    Methods Explanation;Demonstration;Tactile cues;Verbal cues    Comprehension Verbalized understanding;Returned demonstration;Verbal cues required;Tactile cues required;Need further instruction            PT Short Term Goals - 07/04/20  1306      PT SHORT TERM GOAL #1   Title Be independent with initial home exercise program for self-management of symptoms.    Baseline initial HEP provided at IE (06/08/20);    Time 3    Period Weeks    Status Achieved    Target Date 06/29/20             PT Long Term Goals - 06/08/20 1332      PT LONG TERM GOAL #1   Title Be independent with a long-term home exercise program for self-management of symptoms.    Baseline initial HEP provided at IE (06/08/20);    Time 12    Period Weeks    Status New   TARGET DATE FOR ALL LONG TERM GOALS: 08/31/2020     PT LONG TERM GOAL #2   Title Demonstrate improved FOTO score to 63 by visit #11 to demonstrate improvement in overall condition and self-reported functional ability.    Baseline 41 (06/28/2020);    Time 12    Period Weeks  Status New      PT LONG TERM GOAL #3   Title Have full left shoulder AROM with no compensations or increase in pain in all planes except intermittent end range discomfort to allow patient to complete valued activities with less difficulty.    Baseline limited and painfull - see objective exam (06/08/20);    Time 12    Period Weeks    Status New      PT LONG TERM GOAL #4   Title Improve left shoulder strength to 4+/5 with no increase in pain for improved ability to allow patient to complete valued functional tasks such as reaching overhead and crafting with less difficulty.    Baseline limited and painfull - see objective exam (06/08/20);    Time 12    Period Weeks    Status New      PT LONG TERM GOAL #5   Title Complete community, work and/or recreational activities without limitation due to current condition.    Baseline Functional Limitations: difficulty with usual activities that require use of left UE including reaching for things with left hand, turning to the left, dressing, grooming, getting groceries,  cooking, cleaning, cross stitching, sleeping (06/08/2020);    Time 12    Period Weeks    Status New                  Plan - 07/04/20 1305    Clinical Impression Statement Patient tolerated session well and reports her shoulder feels better at end of session. Reported concordant pain with STM to posterior rotator cuff as well as STM to lower cervical spine musculature. Reported increased paresthesia in L hand with cervical spine distraction. Was able to complete all exercises without exacerbation of symptoms except brief increase in concordant pain with L medial nerve glides. Updated HEP to include more advanced cervical spine exercise that patient demonstrated ability to perform in the clinic. Patient would benefit from continued management of limiting condition by skilled physical therapist to address remaining impairments and functional limitations to work towards stated goals and return to PLOF or maximal functional independence.    Personal Factors and Comorbidities Comorbidity 2    Comorbidities Relevant past medical history and comorbidities include carpal tunnel syndrome (maybe undiagnosed left), CHF, Diabetes, hypertension, nonischemic cardiomyopathy, hx of pleural effusion left lung (2018), hx left heart cath and coronary angiography (2018), L foot fracture (fall Oct 2020) wears post op shoe. Patient denies hx of cancer, stroke, seizures, unexplained weight loss, changes in bowel or bladder problems, new onset stumbling or dropping things.    Examination-Activity Limitations Caring for Others    Examination-Participation Restrictions Cleaning;Laundry;Meal Prep    Stability/Clinical Decision Making Evolving/Moderate complexity    Rehab Potential Poor    PT Frequency 2x / week    PT Duration 12 weeks    PT Treatment/Interventions ADLs/Self Care Home Management;Aquatic Therapy;Cryotherapy;Moist Heat;Electrical Stimulation;Therapeutic activities;Therapeutic exercise;Neuromuscular re-education;Patient/family education;Manual techniques;Passive range of motion;Dry needling;Spinal  Manipulations;Joint Manipulations    PT Next Visit Plan manual therapy and exercise as tolerated, update HEP as appropriate    PT Home Exercise Plan medbridge  Access Code: [email or web address]    Consulted and Agree with Plan of Care Patient           Patient will benefit from skilled therapeutic intervention in order to improve the following deficits and impairments:  Improper body mechanics,Pain,Decreased coordination,Postural dysfunction,Increased muscle spasms,Decreased activity tolerance,Decreased endurance,Decreased range of motion,Decreased strength,Hypomobility,Impaired perceived functional ability,Impaired UE functional use  Visit Diagnosis: Chronic left  shoulder pain  Cervicalgia  Paresthesia of skin     Problem List Patient Active Problem List   Diagnosis Date Noted  . Acute pain of left shoulder 05/11/2020  . Pain due to onychomycosis of toenails of both feet 02/25/2020  . Rash and nonspecific skin eruption 11/26/2019  . Screening for malignant neoplasm of breast 08/20/2019  . Screening for osteoporosis 08/20/2019  . Fecal incontinence 01/23/2019  . NICM (nonischemic cardiomyopathy) ([place]) 08/22/2017  . CHF (congestive heart failure) ([place]) 04/03/2017  . Protein-calorie malnutrition, severe 11/02/2016  . Hypokalemia 04/28/2013  . Leukocytosis, unspecified 04/28/2013  . Uncontrolled type 2 diabetes mellitus with hyperglycemia ([place])   . Essential hypertension   . Hypercholesterolemia 11/10/2012    Everlean Alstrom. Graylon Good, PT, DPT 07/04/20, 1:07 PM  [facility] [address] [place], [address] Phone: [phone]   Fax:  [phone]  Name: Erin Hayes MRN: [id number] Date of Birth: July 19, 1965

## 2020-07-06 ENCOUNTER — Encounter: Payer: Self-pay | Admitting: Physical Therapy

## 2020-07-06 ENCOUNTER — Ambulatory Visit: Payer: Medicaid Other | Attending: Family Medicine | Admitting: Physical Therapy

## 2020-07-06 ENCOUNTER — Other Ambulatory Visit: Payer: Self-pay

## 2020-07-06 DIAGNOSIS — G8929 Other chronic pain: Secondary | ICD-10-CM | POA: Diagnosis present

## 2020-07-06 DIAGNOSIS — R202 Paresthesia of skin: Secondary | ICD-10-CM | POA: Insufficient documentation

## 2020-07-06 DIAGNOSIS — M542 Cervicalgia: Secondary | ICD-10-CM

## 2020-07-06 DIAGNOSIS — M25512 Pain in left shoulder: Secondary | ICD-10-CM | POA: Insufficient documentation

## 2020-07-06 NOTE — Therapy (Signed)
[facility] [address] [place], [address] Phone: [phone]   Fax:  [phone]  Physical Therapy Treatment  Patient Details  Name: Erin Hayes MRN: [id number] Date of Birth: 11/19/1965 Referring Provider (PT): Verl Bangs, FNP   Encounter Date: 07/06/2020   PT End of Session - 07/06/20 0954    Visit Number 7    Number of Visits 24    Date for PT Re-Evaluation 08/31/20    Authorization Type [place] reporting period from 06/08/2020    Authorization Time Period 27 PT/OT/ST visit per calendar year    Authorization - Visit Number 6    Authorization - Number of Visits 27    Progress Note Due on Visit 10    PT Start Time 0950    PT Stop Time 1028    PT Time Calculation (min) 38 min    Activity Tolerance Patient limited by pain    Behavior During Therapy [facility] for tasks assessed/performed           Past Medical History:  Diagnosis Date  . Carpal tunnel syndrome of right wrist   . Chronic combined systolic (congestive) and diastolic (congestive) heart failure ([place])    a. 03/2017 Echo: EF 20-25%, diff HK, Gr2 DD; b. 09/2017 Echo: EF 20-25%, diff HK, Gr2 DD.  . Diabetes ([place])   . GERD (gastroesophageal reflux disease)   . HLD (hyperlipidemia)   . Hypertension   . NICM (nonischemic cardiomyopathy) ([place])    a. 03/2017 Echo: EF 20-25%, diff HK, Gr2 DD, mild to mod MR, nl RV fxn;  b. 03/2017 Cath: mild nonobs dzs, EF 25%; c. 09/2017 Echo: EF 20-25%, diff HK. Gr2 DD. Mild MR. Mildly reduced RV fxn.  . Pleural effusion, left    a. 03/2017 s/p thoracentesis-->300 ml withdrawn--transudative.  . Pneumonia 02/19/2017    Past Surgical History:  Procedure Laterality Date  . COLONOSCOPY WITH PROPOFOL N/A 01/21/2017   Procedure: COLONOSCOPY WITH PROPOFOL;  Surgeon: Jonathon Bellows, MD;  Location: [facility] ENDOSCOPY;  Service: Gastroenterology;  Laterality: N/A;  . ESOPHAGOGASTRODUODENOSCOPY (EGD)  WITH PROPOFOL N/A 01/21/2017   Procedure: ESOPHAGOGASTRODUODENOSCOPY (EGD) WITH PROPOFOL;  Surgeon: Jonathon Bellows, MD;  Location: [facility] ENDOSCOPY;  Service: Gastroenterology;  Laterality: N/A;  . FLEXIBLE SIGMOIDOSCOPY N/A 11/04/2016   Procedure: FLEXIBLE SIGMOIDOSCOPY;  Surgeon: Wilford Corner, MD;  Location: [facility] ENDOSCOPY;  Service: Endoscopy;  Laterality: N/A;  . LEFT HEART CATH AND CORONARY ANGIOGRAPHY N/A 04/05/2017   Procedure: LEFT HEART CATH AND CORONARY ANGIOGRAPHY;  Surgeon: Wellington Hampshire, MD;  Location: [place] CV LAB;  Service: Cardiovascular;  Laterality: N/A;  . LITHOTRIPSY      There were no vitals filed for this visit.   Subjective Assessment - 07/06/20 1020    Subjective Patient reports no pain upon arrival but states her shoulder feels about the same overall. Last had pain later in the day after last PT session but thought she felt better just after the session. Reports pain continus with reaching. Did not do her HEP yesterday.    Pertinent History Patient is a 55 y.o. female who presents to outpatient physical therapy with a referral for medical diagnosis acute pain of left shoulder. This patient's chief complaints consist of left shoulder pain, leading to the following functional deficits: difficulty with usual activities that require use of left UE including reaching for things with left hand, turning to the left, dressing, grooming, getting groceries,  cooking, cleaning,  cross stitching, sleeping.  Relevant past medical history and comorbidities include carpal tunnel syndrome (maybe undiagnosed left), CHF, Diabetes, hypertension, nonischemic cardiomyopathy, hx of pleural effusion left lung (2018), hx left heart cath and coronary angiography (2018), L foot fracture (fall Oct 2020) wears shoe. Patient denies hx of cancer, stroke, seizures, unexplained weight loss, changes in bowel or bladder problems, new onset stumbling or dropping things.    Limitations Lifting;House hold  activities;Other (comment)    Currently in Pain? No/denies           TREATMENT:   Denies sensitivity to latex  Therapeutic exercise:to centralize symptoms and improve ROM, strength, muscular endurance, and activity tolerance required for successful completion of functional activities.  -Seated scapular retraction -x 10  -standing scapular retraction - x 10 - Standing rows with scapular retraction for improved postural and shoulder girdle strengthening and mobility. Required instruction for technique and cuing to retract, posteriorly tilt, and depress scapulae. 3x20 with green theraband - supine deep neck flexor nod x 10 - supine deep neck flexor nod with lift 3x10 - prone cervical retraction with lift off of rag roll at forehead, 3x10 (manual therapy - see below) - supine chest press with plus, 3x10 with 3#DBs bilaterally.  - supine bicep curls 3x10 with 3#DB bilaterally - sidelying L shoulder abduction to 90 degrees, 3x10, 2#DB (3# painful) -Seated LUE GHJ ER, elbow supported on table at 75 degrees shoudler, forearm perpendicular to gravity at start -3 x 10 with 3#DB  -seated towel slide flexion, scaption 2x10 - Education on HEP including handout    Manual therapy: to reduce pain and tissue tension, improve range of motion, neuromodulation, in order to promote improved ability to complete functional activities. SUPINE POSITION - Intermittent Cervical spine distraction with STM to posterior cervical spine musculature and B SCM.  -Supine PROM median nerve glide -1x 10  - supine GH AP joint mobs grade II 2x30sec bouts   HOME EXERCISE PROGRAM Access Code: [email or web address] URL: [email or web address] Date: 07/06/2020 Prepared by: [name]  Exercises Row with band/cable - 1 x daily - 3 sets - 10 reps - 1 second hold Standing Median Nerve Glide - 1 x daily - 3 sets - 10 reps Supine Deep Neck Flexor Training - Repetitions - 1 x daily - 2 sets - 10 reps - 1-5  seconds hold Prone Cervical Retraction - 1 x daily - 2 sets - 10 reps - 1-5 seconds hold     PT Education - 07/06/20 1022    Education Details exercise form technique/form    Person(s) Educated Patient    Methods Demonstration;Tactile cues;Explanation;Verbal cues;Handout    Comprehension Verbalized understanding;Returned demonstration;Verbal cues required;Tactile cues required;Need further instruction            PT Short Term Goals - 07/04/20 1306      PT SHORT TERM GOAL #1   Title Be independent with initial home exercise program for self-management of symptoms.    Baseline initial HEP provided at IE (06/08/20);    Time 3    Period Weeks    Status Achieved    Target Date 06/29/20             PT Long Term Goals - 06/08/20 1332      PT LONG TERM GOAL #1   Title Be independent with a long-term home exercise program for self-management of symptoms.    Baseline initial HEP provided at IE (06/08/20);    Time 12    Period Weeks  Status New   TARGET DATE FOR ALL LONG TERM GOALS: 08/31/2020     PT LONG TERM GOAL #2   Title Demonstrate improved FOTO score to 63 by visit #11 to demonstrate improvement in overall condition and self-reported functional ability.    Baseline 41 (06/28/2020);    Time 12    Period Weeks    Status New      PT LONG TERM GOAL #3   Title Have full left shoulder AROM with no compensations or increase in pain in all planes except intermittent end range discomfort to allow patient to complete valued activities with less difficulty.    Baseline limited and painfull - see objective exam (06/08/20);    Time 12    Period Weeks    Status New      PT LONG TERM GOAL #4   Title Improve left shoulder strength to 4+/5 with no increase in pain for improved ability to allow patient to complete valued functional tasks such as reaching overhead and crafting with less difficulty.    Baseline limited and painfull - see objective exam (06/08/20);    Time 12    Period Weeks     Status New      PT LONG TERM GOAL #5   Title Complete community, work and/or recreational activities without limitation due to current condition.    Baseline Functional Limitations: difficulty with usual activities that require use of left UE including reaching for things with left hand, turning to the left, dressing, grooming, getting groceries,  cooking, cleaning, cross stitching, sleeping (06/08/2020);    Time 12    Period Weeks    Status New                 Plan - 07/06/20 1028    Clinical Impression Statement Patient tolerated treatment well overall and was able to advance shoulder and neck strengthening exercises. Continues to have pain with reaching. Did seem less tender during manual therapy overall today. Patient would benefit from continued management of limiting condition by skilled physical therapist to address remaining impairments and functional limitations to work towards stated goals and return to PLOF or maximal functional independence.    Personal Factors and Comorbidities Comorbidity 2    Comorbidities Relevant past medical history and comorbidities include carpal tunnel syndrome (maybe undiagnosed left), CHF, Diabetes, hypertension, nonischemic cardiomyopathy, hx of pleural effusion left lung (2018), hx left heart cath and coronary angiography (2018), L foot fracture (fall Oct 2020) wears post op shoe. Patient denies hx of cancer, stroke, seizures, unexplained weight loss, changes in bowel or bladder problems, new onset stumbling or dropping things.    Examination-Activity Limitations Caring for Others    Examination-Participation Restrictions Cleaning;Laundry;Meal Prep    Stability/Clinical Decision Making Evolving/Moderate complexity    Rehab Potential Poor    PT Frequency 2x / week    PT Duration 12 weeks    PT Treatment/Interventions ADLs/Self Care Home Management;Aquatic Therapy;Cryotherapy;Moist Heat;Electrical Stimulation;Therapeutic activities;Therapeutic  exercise;Neuromuscular re-education;Patient/family education;Manual techniques;Passive range of motion;Dry needling;Spinal Manipulations;Joint Manipulations    PT Next Visit Plan manual therapy and exercise as tolerated, update HEP as appropriate    PT Home Exercise Plan [email or web address]  Access Code: [email or web address]    Consulted and Agree with Plan of Care Patient           Patient will benefit from skilled therapeutic intervention in order to improve the following deficits and impairments:  Improper body mechanics,Pain,Decreased coordination,Postural dysfunction,Increased muscle spasms,Decreased activity tolerance,Decreased endurance,Decreased range of motion,Decreased strength,Hypomobility,Impaired  perceived functional ability,Impaired UE functional use  Visit Diagnosis: Chronic left shoulder pain  Cervicalgia  Paresthesia of skin     Problem List Patient Active Problem List   Diagnosis Date Noted  . Acute pain of left shoulder 05/11/2020  . Pain due to onychomycosis of toenails of both feet 02/25/2020  . Rash and nonspecific skin eruption 11/26/2019  . Screening for malignant neoplasm of breast 08/20/2019  . Screening for osteoporosis 08/20/2019  . Fecal incontinence 01/23/2019  . NICM (nonischemic cardiomyopathy) ([place]) 08/22/2017  . CHF (congestive heart failure) ([place]) 04/03/2017  . Protein-calorie malnutrition, severe 11/02/2016  . Hypokalemia 04/28/2013  . Leukocytosis, unspecified 04/28/2013  . Uncontrolled type 2 diabetes mellitus with hyperglycemia ([place])   . Essential hypertension   . Hypercholesterolemia 11/10/2012    Everlean Alstrom. Graylon Good, PT, DPT 07/06/20, 10:30 AM  [facility] [address], [place], [address] Phone: [phone]   Fax:  [phone]  Name: Erin Hayes MRN: [id number] Date of Birth: 1966/01/19

## 2020-07-07 ENCOUNTER — Encounter: Payer: Self-pay | Admitting: Podiatry

## 2020-07-07 ENCOUNTER — Ambulatory Visit: Payer: Medicaid Other | Admitting: Podiatry

## 2020-07-07 ENCOUNTER — Ambulatory Visit (INDEPENDENT_AMBULATORY_CARE_PROVIDER_SITE_OTHER): Payer: Medicaid Other

## 2020-07-07 DIAGNOSIS — S92302A Fracture of unspecified metatarsal bone(s), left foot, initial encounter for closed fracture: Secondary | ICD-10-CM | POA: Diagnosis not present

## 2020-07-07 DIAGNOSIS — E1165 Type 2 diabetes mellitus with hyperglycemia: Secondary | ICD-10-CM

## 2020-07-07 NOTE — Progress Notes (Signed)
Subjective:  Patient ID: Erin Hayes, female    DOB: March 30, 1966,  MRN: [id number]  Chief Complaint  Patient presents with  . Fracture    "its better, but still has occasional pains"    55 y.o. female presents with the above complaint.  Patient presents with a follow-up of recently diagnosed with metatarsal head fractures very minimally displaced left third and fourth.  The patient was seen by Dr. Prudence Davidson who had diagnosed her with the fractures.  Place her in a boot and has been ambulating weightbearing as tolerated with surgical shoe.  She also has severe osteopenia as well.  She states that she is doing much better but she still has occasional pain when palpation.  She denies any other acute complaints.  She has been ambulating with surgical shoe.   Review of Systems: Negative except as noted in the HPI. Denies N/V/F/Ch.  Past Medical History:  Diagnosis Date  . Carpal tunnel syndrome of right wrist   . Chronic combined systolic (congestive) and diastolic (congestive) heart failure ([place])    a. 03/2017 Echo: EF 20-25%, diff HK, Gr2 DD; b. 09/2017 Echo: EF 20-25%, diff HK, Gr2 DD.  . Diabetes ([place])   . GERD (gastroesophageal reflux disease)   . HLD (hyperlipidemia)   . Hypertension   . NICM (nonischemic cardiomyopathy) ([place])    a. 03/2017 Echo: EF 20-25%, diff HK, Gr2 DD, mild to mod MR, nl RV fxn;  b. 03/2017 Cath: mild nonobs dzs, EF 25%; c. 09/2017 Echo: EF 20-25%, diff HK. Gr2 DD. Mild MR. Mildly reduced RV fxn.  . Pleural effusion, left    a. 03/2017 s/p thoracentesis-->300 ml withdrawn--transudative.  . Pneumonia 02/19/2017    Current Outpatient Medications:  .  albuterol (VENTOLIN HFA) 108 (90 Base) MCG/ACT inhaler, Inhale 2 puffs into the lungs every 4 (four) hours as needed for wheezing or shortness of breath., Disp: 6.7 g, Rfl: 1 .  aspirin 81 MG tablet, Take 81 mg by mouth daily., Disp: , Rfl:  .  carvedilol (COREG) 12.5 MG tablet, Take 1.5 tablets (18.75 mg total) by  mouth 2 (two) times daily., Disp: 270 tablet, Rfl: 3 .  chlorhexidine (PERIDEX) 0.12 % solution, 15 mLs 2 (two) times daily., Disp: , Rfl:  .  Exenatide ER (BYDUREON BCISE) 2 MG/0.85ML AUIJ, Inject 2 mg into the skin once a week., Disp: 4 pen, Rfl: 1 .  furosemide (LASIX) 20 MG tablet, Take 1 tablet (20 mg total) by mouth daily., Disp: 90 tablet, Rfl: 3 .  glucose blood (CONTOUR TEST) test strip, by Other route Two (2) times a day., Disp: , Rfl:  .  HYDROcodone-acetaminophen (NORCO/VICODIN) 5-325 MG tablet, Take 1 tablet by mouth every 6 (six) hours as needed., Disp: , Rfl:  .  hydrocortisone 2.5 % lotion, Apply topically., Disp: , Rfl:  .  Hyoscyamine Sulfate SL 0.125 MG SUBL, Dissolve 1-2 tablets on your tongue every 4-6 hours as needed for cramping or diarrhea, Disp: , Rfl:  .  ibuprofen (ADVIL) 400 MG tablet, Take 400 mg by mouth 3 (three) times daily., Disp: , Rfl:  .  insulin glargine (LANTUS) 100 UNIT/ML Solostar Pen, Inject 14 Units into the skin at bedtime., Disp: 15 mL, Rfl: 3 .  Insulin Pen Needle 31G X 5 MM MISC, Use as directed with Lantus, Disp: 100 each, Rfl: 3 .  loperamide (IMODIUM) 1 MG/5ML solution, Take 1 mg by mouth daily., Disp: , Rfl:  .  meloxicam (MOBIC) 15 MG tablet, Take 1  tablet (15 mg total) by mouth daily., Disp: 90 tablet, Rfl: 1 .  metFORMIN (GLUCOPHAGE) 500 MG tablet, Take 1 tablet (500 mg total) by mouth 2 (two) times daily with a meal., Disp: 180 tablet, Rfl: 1 .  ondansetron (ZOFRAN) 4 MG tablet, Take by mouth., Disp: , Rfl:  .  rosuvastatin (CRESTOR) 20 MG tablet, Take 1 tablet (20 mg total) by mouth daily., Disp: 90 tablet, Rfl: 3 .  sacubitril-valsartan (ENTRESTO) 49-51 MG, Take 1 tablet by mouth 2 (two) times daily., Disp: 30 tablet, Rfl: 3 .  spironolactone (ALDACTONE) 25 MG tablet, Take 1 tablet (25 mg total) by mouth daily., Disp: 90 tablet, Rfl: 2  Social History   Tobacco Use  Smoking Status Never Smoker  Smokeless Tobacco Never Used    No Known  Allergies Objective:  There were no vitals filed for this visit. There is no height or weight on file to calculate BMI. Constitutional Well developed. Well nourished.  Vascular Dorsalis pedis pulses palpable bilaterally. Posterior tibial pulses palpable bilaterally. Capillary refill normal to all digits.  No cyanosis or clubbing noted. Pedal hair growth normal.  Neurologic Normal speech. Oriented to person, place, and time. Epicritic sensation to light touch grossly present bilaterally.  Dermatologic Nails well groomed and normal in appearance. No open wounds. No skin lesions.  Orthopedic:  Pain on palpation with range of motion of the second and third digit.  Pain with range of motion/intra-articular pain of the third digit.  No pain of the hallux and fourth and fifth digit.  Pain on palpation/dorsal swelling noted to the forefoot of third metatarsal head as well as second metatarsal head.  No extensor tendinitis noted.   Radiographs: 3 views of skeletally mature adult left foot: No fractures noted.  Severe osteopenia noted.  The fracture may have consolidated.  Assessment:   1. Closed fracture of head of metatarsal, left, initial encounter   2. Uncontrolled type 2 diabetes mellitus with hyperglycemia ([place])    Plan:  Patient was evaluated and treated and all questions answered.  Left forefoot pain with history of chronic fracture of third and fourth metatarsal head -I explained to the patient the etiology of pain and various treatment options were discussed.  Given that in this x-ray I do not see any concern for fracture I will have her still continue wearing the surgical shoe for now.  If her pain is not improved in next few weeks we will discuss steroid injection to help with inflammation.  There might be an inflammatory component to it as opposed to fracture component to it.  She does not have much swelling to the forefoot anymore. -I will see her back again in 6 weeks and will  reassess her pain.  Radiographically her fractures have healed as I do not see radiolucent line.  No follow-ups on file.

## 2020-07-11 ENCOUNTER — Other Ambulatory Visit: Payer: Self-pay

## 2020-07-11 ENCOUNTER — Encounter: Payer: Self-pay | Admitting: Physical Therapy

## 2020-07-11 ENCOUNTER — Ambulatory Visit: Payer: Medicaid Other | Admitting: Physical Therapy

## 2020-07-11 DIAGNOSIS — M542 Cervicalgia: Secondary | ICD-10-CM

## 2020-07-11 DIAGNOSIS — M25512 Pain in left shoulder: Secondary | ICD-10-CM

## 2020-07-11 DIAGNOSIS — R202 Paresthesia of skin: Secondary | ICD-10-CM

## 2020-07-11 DIAGNOSIS — G8929 Other chronic pain: Secondary | ICD-10-CM

## 2020-07-11 NOTE — Therapy (Signed)
[facility] [address], [place], [address] Phone: [phone]   Fax:  [phone]  Physical Therapy Treatment  Patient Details  Name: Erin Hayes MRN: [id number] Date of Birth: March 25, 1966 Referring Provider (PT): Verl Bangs, FNP   Encounter Date: 07/11/2020   PT End of Session - 07/11/20 0950    Visit Number 8    Number of Visits 24    Date for PT Re-Evaluation 08/31/20    Authorization Type [employer] reporting period from 06/08/2020    Authorization Time Period 27 PT/OT/ST visit per calendar year    Authorization - Visit Number 7    Authorization - Number of Visits 27    Progress Note Due on Visit 10    PT Start Time 0945    PT Stop Time 1025    PT Time Calculation (min) 40 min    Activity Tolerance Patient limited by pain    Behavior During Therapy [facility] for tasks assessed/performed           Past Medical History:  Diagnosis Date  . Carpal tunnel syndrome of right wrist   . Chronic combined systolic (congestive) and diastolic (congestive) heart failure ([place])    a. 03/2017 Echo: EF 20-25%, diff HK, Gr2 DD; b. 09/2017 Echo: EF 20-25%, diff HK, Gr2 DD.  . Diabetes ([place])   . GERD (gastroesophageal reflux disease)   . HLD (hyperlipidemia)   . Hypertension   . NICM (nonischemic cardiomyopathy) ([place])    a. 03/2017 Echo: EF 20-25%, diff HK, Gr2 DD, mild to mod MR, nl RV fxn;  b. 03/2017 Cath: mild nonobs dzs, EF 25%; c. 09/2017 Echo: EF 20-25%, diff HK. Gr2 DD. Mild MR. Mildly reduced RV fxn.  . Pleural effusion, left    a. 03/2017 s/p thoracentesis-->300 ml withdrawn--transudative.  . Pneumonia 02/19/2017    Past Surgical History:  Procedure Laterality Date  . COLONOSCOPY WITH PROPOFOL N/A 01/21/2017   Procedure: COLONOSCOPY WITH PROPOFOL;  Surgeon: Jonathon Bellows, MD;  Location: [facility] ENDOSCOPY;  Service: Gastroenterology;  Laterality: N/A;  . ESOPHAGOGASTRODUODENOSCOPY (EGD)  WITH PROPOFOL N/A 01/21/2017   Procedure: ESOPHAGOGASTRODUODENOSCOPY (EGD) WITH PROPOFOL;  Surgeon: Jonathon Bellows, MD;  Location: [facility] ENDOSCOPY;  Service: Gastroenterology;  Laterality: N/A;  . FLEXIBLE SIGMOIDOSCOPY N/A 11/04/2016   Procedure: FLEXIBLE SIGMOIDOSCOPY;  Surgeon: Wilford Corner, MD;  Location: [facility] ENDOSCOPY;  Service: Endoscopy;  Laterality: N/A;  . LEFT HEART CATH AND CORONARY ANGIOGRAPHY N/A 04/05/2017   Procedure: LEFT HEART CATH AND CORONARY ANGIOGRAPHY;  Surgeon: Wellington Hampshire, MD;  Location: [facility] CV LAB;  Service: Cardiovascular;  Laterality: N/A;  . LITHOTRIPSY      There were no vitals filed for this visit.   Subjective Assessment - 07/11/20 0947    Subjective Patient reports her left shoulder pain was "killing" her last night with 10/10 pain. She put a patch on it and that helped. It disrupted her sleep. Currently no pain. She felt okay following last treatment session. States HEP is going well including the new neck exercises.    Pertinent History Patient is a 55 y.o. female who presents to outpatient physical therapy with a referral for medical diagnosis acute pain of left shoulder. This patient's chief complaints consist of left shoulder pain, leading to the following functional deficits: difficulty with usual activities that require use of left UE including reaching for things with left hand, turning to the left, dressing, grooming, getting groceries,  cooking,  cleaning, cross stitching, sleeping.  Relevant past medical history and comorbidities include carpal tunnel syndrome (maybe undiagnosed left), CHF, Diabetes, hypertension, nonischemic cardiomyopathy, hx of pleural effusion left lung (2018), hx left heart cath and coronary angiography (2018), L foot fracture (fall Oct 2020) wears shoe. Patient denies hx of cancer, stroke, seizures, unexplained weight loss, changes in bowel or bladder problems, new onset stumbling or dropping things.    Limitations  Lifting;House hold activities;Other (comment)    Currently in Pain? No/denies           TREATMENT:  Denies sensitivity to latex Therapeutic exercise:to centralize symptoms and improve ROM, strength, muscular endurance, and activity tolerance required for successful completion of functional activities. - Standing rows with scapular retraction for improved postural and shoulder girdle strengthening and mobility. Required instruction for technique and cuing to retract, posteriorly tilt, and depress scapulae. 3x20 with 10# cable.  - standing left shoulder  AAROM scaption on wall, 1x20. Pain each rep, no worse.  (manual therapy - see below) - supine deep neck flexor nod with lift 3x10, 2 second hold (feels good, needs cuing for long enough hold).  - supine chest press with plus, 3x10 with 4#DBs bilaterally.   - sidelying L shoulder abduction to 90 degrees, 3x10, 2#DB (cuing for control). - prone cervical retraction with lift off of rag roll at forehead, 3x10 - standing bicep curls 3x10 with 3#DB each side. -Seated LUE GHJ ER, elbow supported on table at 75 degrees shoudler, forearm perpendicular to gravity at start -3 x 10 with 3#DB   Manual therapy:to reduce pain and tissue tension, improve range of motion, neuromodulation, in order to promote improved ability to complete functional activities. SUPINE POSITION - Intermittent Cervical spine distraction with STM to posterior cervical spine musculature and B SCM (distraction increased L hand paresthesia).  - STM to left UT and levator, anterior scalenes (very tight and reproduces pain).  - STM to L pectoralis major/minor (tender) - checked L subscapularis and posterior RTC, not contributory to concordant symptoms.     HOME EXERCISE PROGRAM Access Code: [email or web address] URL: [email or web address] Date: 07/06/2020 Prepared by: [name]  Exercises Row with band/cable - 1 x daily - 3 sets - 10 reps - 1 second  hold Standing Median Nerve Glide - 1 x daily - 3 sets - 10 reps Supine Deep Neck Flexor Training - Repetitions - 1 x daily - 2 sets - 10 reps - 1-5 seconds hold Prone Cervical Retraction - 1 x daily - 2 sets - 10 reps - 1-5 seconds hold    PT Education - 07/11/20 0948    Education Details exercise form technique/form    Person(s) Educated Patient    Methods Explanation;Demonstration;Tactile cues;Verbal cues    Comprehension Verbalized understanding;Returned demonstration;Verbal cues required;Tactile cues required;Need further instruction            PT Short Term Goals - 07/04/20 1306      PT SHORT TERM GOAL #1   Title Be independent with initial home exercise program for self-management of symptoms.    Baseline initial HEP provided at IE (06/08/20);    Time 3    Period Weeks    Status Achieved    Target Date 06/29/20             PT Long Term Goals - 06/08/20 1332      PT LONG TERM GOAL #1   Title Be independent with a long-term home exercise program for self-management of symptoms.    Baseline initial  HEP provided at IE (06/08/20);    Time 12    Period Weeks    Status New   TARGET DATE FOR ALL LONG TERM GOALS: 08/31/2020     PT LONG TERM GOAL #2   Title Demonstrate improved FOTO score to 63 by visit #11 to demonstrate improvement in overall condition and self-reported functional ability.    Baseline 41 (06/28/2020);    Time 12    Period Weeks    Status New      PT LONG TERM GOAL #3   Title Have full left shoulder AROM with no compensations or increase in pain in all planes except intermittent end range discomfort to allow patient to complete valued activities with less difficulty.    Baseline limited and painfull - see objective exam (06/08/20);    Time 12    Period Weeks    Status New      PT LONG TERM GOAL #4   Title Improve left shoulder strength to 4+/5 with no increase in pain for improved ability to allow patient to complete valued functional tasks such as  reaching overhead and crafting with less difficulty.    Baseline limited and painfull - see objective exam (06/08/20);    Time 12    Period Weeks    Status New      PT LONG TERM GOAL #5   Title Complete community, work and/or recreational activities without limitation due to current condition.    Baseline Functional Limitations: difficulty with usual activities that require use of left UE including reaching for things with left hand, turning to the left, dressing, grooming, getting groceries,  cooking, cleaning, cross stitching, sleeping (06/08/2020);    Time 12    Period Weeks    Status New                 Plan - 07/11/20 1019    Clinical Impression Statement Patient tolerated treatment well overall. Continues to struggle with symptoms between visits and report concordant pain with cervical spine related manual therapy. Able to progress some exercises today and report neck exercises improve symptoms during them. Continues to require cuing for tempo.  Patient would benefit from continued management of limiting condition by skilled physical therapist to address remaining impairments and functional limitations to work towards stated goals and return to PLOF or maximal functional independence.    Personal Factors and Comorbidities Comorbidity 2    Comorbidities Relevant past medical history and comorbidities include carpal tunnel syndrome (maybe undiagnosed left), CHF, Diabetes, hypertension, nonischemic cardiomyopathy, hx of pleural effusion left lung (2018), hx left heart cath and coronary angiography (2018), L foot fracture (fall Oct 2020) wears post op shoe. Patient denies hx of cancer, stroke, seizures, unexplained weight loss, changes in bowel or bladder problems, new onset stumbling or dropping things.    Examination-Activity Limitations Caring for Others    Examination-Participation Restrictions Cleaning;Laundry;Meal Prep    Stability/Clinical Decision Making Evolving/Moderate complexity     Rehab Potential Poor    PT Frequency 2x / week    PT Duration 12 weeks    PT Treatment/Interventions ADLs/Self Care Home Management;Aquatic Therapy;Cryotherapy;Moist Heat;Electrical Stimulation;Therapeutic activities;Therapeutic exercise;Neuromuscular re-education;Patient/family education;Manual techniques;Passive range of motion;Dry needling;Spinal Manipulations;Joint Manipulations    PT Next Visit Plan manual therapy and exercise as tolerated, update HEP as appropriate    PT Home Exercise Plan medbridge  Access Code: [email or web address]    Consulted and Agree with Plan of Care Patient  Patient will benefit from skilled therapeutic intervention in order to improve the following deficits and impairments:  Improper body mechanics,Pain,Decreased coordination,Postural dysfunction,Increased muscle spasms,Decreased activity tolerance,Decreased endurance,Decreased range of motion,Decreased strength,Hypomobility,Impaired perceived functional ability,Impaired UE functional use  Visit Diagnosis: Chronic left shoulder pain  Cervicalgia  Paresthesia of skin     Problem List Patient Active Problem List   Diagnosis Date Noted  . Acute pain of left shoulder 05/11/2020  . Pain due to onychomycosis of toenails of both feet 02/25/2020  . Rash and nonspecific skin eruption 11/26/2019  . Screening for malignant neoplasm of breast 08/20/2019  . Screening for osteoporosis 08/20/2019  . Fecal incontinence 01/23/2019  . NICM (nonischemic cardiomyopathy) ([place]) 08/22/2017  . CHF (congestive heart failure) ([place]) 04/03/2017  . Protein-calorie malnutrition, severe 11/02/2016  . Hypokalemia 04/28/2013  . Leukocytosis, unspecified 04/28/2013  . Uncontrolled type 2 diabetes mellitus with hyperglycemia ([place])   . Essential hypertension   . Hypercholesterolemia 11/10/2012    Everlean Alstrom. Graylon Good, PT, DPT 07/11/20, 10:27 AM  [facility] [address] [place], [address] Phone: [phone]   Fax:  [phone]  Name: MAKESHIA SEAT MRN: [id number] Date of Birth: 19-Apr-1966

## 2020-07-13 ENCOUNTER — Ambulatory Visit: Payer: Medicaid Other | Admitting: Physical Therapy

## 2020-07-14 ENCOUNTER — Encounter: Payer: Medicaid Other | Admitting: Physical Therapy

## 2020-07-18 ENCOUNTER — Encounter: Payer: Self-pay | Admitting: Physical Therapy

## 2020-07-18 ENCOUNTER — Ambulatory Visit: Payer: Medicaid Other | Admitting: Physical Therapy

## 2020-07-18 ENCOUNTER — Other Ambulatory Visit: Payer: Self-pay

## 2020-07-18 DIAGNOSIS — G8929 Other chronic pain: Secondary | ICD-10-CM

## 2020-07-18 DIAGNOSIS — M25512 Pain in left shoulder: Secondary | ICD-10-CM | POA: Diagnosis not present

## 2020-07-18 DIAGNOSIS — M542 Cervicalgia: Secondary | ICD-10-CM

## 2020-07-18 DIAGNOSIS — R202 Paresthesia of skin: Secondary | ICD-10-CM

## 2020-07-18 NOTE — Therapy (Signed)
[facility] [address] [place], [address] Phone: [phone]   Fax:  [phone]  Physical Therapy Treatment  Patient Details  Name: Erin Hayes MRN: [id number] Date of Birth: 17-Nov-1965 Referring Provider (PT): Verl Bangs, FNP   Encounter Date: 07/18/2020   PT End of Session - 07/18/20 0951    Visit Number 9    Number of Visits 24    Date for PT Re-Evaluation 08/31/20    Authorization Type [employer] reporting period from 06/08/2020    Authorization Time Period 27 PT/OT/ST visit per calendar year    Authorization - Visit Number 8    Authorization - Number of Visits 27    Progress Note Due on Visit 10    PT Start Time 0945    PT Stop Time 1025    PT Time Calculation (min) 40 min    Activity Tolerance Patient limited by pain;Patient tolerated treatment well    Behavior During Therapy Day Erin Hayes for tasks assessed/performed           Past Medical History:  Diagnosis Date  . Carpal tunnel syndrome of right wrist   . Chronic combined systolic (congestive) and diastolic (congestive) heart failure ([place])    a. 03/2017 Echo: EF 20-25%, diff HK, Gr2 DD; b. 09/2017 Echo: EF 20-25%, diff HK, Gr2 DD.  . Diabetes ([place])   . GERD (gastroesophageal reflux disease)   . HLD (hyperlipidemia)   . Hypertension   . NICM (nonischemic cardiomyopathy) ([place])    a. 03/2017 Echo: EF 20-25%, diff HK, Gr2 DD, mild to mod MR, nl RV fxn;  b. 03/2017 Cath: mild nonobs dzs, EF 25%; c. 09/2017 Echo: EF 20-25%, diff HK. Gr2 DD. Mild MR. Mildly reduced RV fxn.  . Pleural effusion, left    a. 03/2017 s/p thoracentesis-->300 ml withdrawn--transudative.  . Pneumonia 02/19/2017    Past Surgical History:  Procedure Laterality Date  . COLONOSCOPY WITH PROPOFOL N/A 01/21/2017   Procedure: COLONOSCOPY WITH PROPOFOL;  Surgeon: Jonathon Bellows, MD;  Location: [facility] ENDOSCOPY;  Service: Gastroenterology;  Laterality: N/A;  .  ESOPHAGOGASTRODUODENOSCOPY (EGD) WITH PROPOFOL N/A 01/21/2017   Procedure: ESOPHAGOGASTRODUODENOSCOPY (EGD) WITH PROPOFOL;  Surgeon: Jonathon Bellows, MD;  Location: [facility] END[facility]COPY;  Service: Gastroenterology;  Laterality: N/A;  . FLEXIBLE SIGMOIDOSCOPY N/A 11/04/2016   Procedure: FLEXIBLE SIGMOIDOSCOPY;  Surgeon: Wilford Corner, MD;  Location: [facility] ENDOSCOPY;  Service: Endoscopy;  Laterality: N/A;  . LEFT HEART CATH AND CORONARY ANGIOGRAPHY N/A 04/05/2017   Procedure: LEFT HEART CATH AND CORONARY ANGIOGRAPHY;  Surgeon: Wellington Hampshire, MD;  Location: [facility] CV LAB;  Service: Cardiovascular;  Laterality: N/A;  . LITHOTRIPSY      There were no vitals filed for this visit.   Subjective Assessment - 07/18/20 0949    Subjective Patient reports she is feeling well today and reports her left shoulder has been feeling better. State she felt okay after last treatment session. HEP has been going well. Reports no pain upon arrival.    Pertinent History Patient is a 55 y.o. female who presents to outpatient physical therapy with a referral for medical diagnosis acute pain of left shoulder. This patient's chief complaints consist of left shoulder pain, leading to the following functional deficits: difficulty with usual activities that require use of left UE including reaching for things with left hand, turning to the left, dressing, grooming, getting groceries,  cooking, cleaning, cross stitching, sleeping.  Relevant past medical history and  comorbidities include carpal tunnel syndrome (maybe undiagnosed left), CHF, Diabetes, hypertension, nonischemic cardiomyopathy, hx of pleural effusion left lung (2018), hx left heart cath and coronary angiography (2018), L foot fracture (fall Oct 2020) wears shoe. Patient denies hx of cancer, stroke, seizures, unexplained weight loss, changes in bowel or bladder problems, new onset stumbling or dropping things.    Limitations Lifting;House hold activities;Other (comment)     Currently in Pain? No/denies             TREATMENT:  Denies sensitivity to latex Therapeutic exercise:to centralize symptoms and improve ROM, strength, muscular endurance, and activity tolerance required for successful completion of functional activities. -Standing rows with scapular retraction for improved postural and shoulder girdle strengthening and mobility. Required instruction for technique and cuing to retract, posteriorly tilt, and depress scapulae.3x20 with 10# cable.  - reaching to shoulder height with rag handing it back and forth to PT while discussing how difficult it is, x 4. Reports moderate difficulty.  - standing left shoulder AAROM flexion on wall, 1x20. Pain each rep, no worse.   (manual therapy - see below)  - supine deep neck flexor nod with lift 3x10, 5 second hold (feels good, needs cuing for long enough hold).  - supine chest press with plus, 3x10 with 4#DBs bilaterally.  (5# too heavy) - sidelying L shoulder abduction to 90 degrees, 3x10, 2#DB (cuing for control, 3# painful and hard to control). - prone cervical retraction with lift off of rag roll at forehead, 3x10, 5 second hold.  - standing bicep curls 3x10 with 4#DB each side. -Seated LUE [facility] ER, elbow supported on table at 75 degrees shoudler, forearm perpendicular to gravity at start -3x 10 with 4#DB   Manual therapy:to reduce pain and tissue tension, improve range of motion, neuromodulation, in order to promote improved ability to complete functional activities. SUPINE POSITION - Intermittent Cervical spine distraction with STM to posterior cervical spine musculature and B SCM (distraction increased L hand paresthesia with fingers under occiput but not when positioned more broadly and laterally).  - STM to left UT and levator, anterior scalenes (very tight and reproduces pain).  - STM to L pectoralis major/minor (tender)   HOME EXERCISE PROGRAM Access Code: [email or web address] URL:  [email or web address] Date: 07/06/2020 Prepared by: [name]  Exercises Row with band/cable - 1 x daily - 3 sets - 10 reps - 1 second hold Standing Median Nerve Glide - 1 x daily - 3 sets - 10 reps Supine Deep Neck Flexor Training - Repetitions - 1 x daily - 2 sets - 10 reps - 1-5 seconds hold Prone Cervical Retraction - 1 x daily - 2 sets - 10 reps - 1-5 seconds hold    PT Education - 07/18/20 0951    Education Details exercise form technique/form    Person(s) Educated Patient    Methods Explanation;Demonstration;Tactile cues;Verbal cues    Comprehension Verbalized understanding;Returned demonstration;Verbal cues required;Tactile cues required;Need further instruction            PT Short Term Goals - 07/04/20 1306      PT SHORT TERM GOAL #1   Title Be independent with initial home exercise program for self-management of symptoms.    Baseline initial HEP provided at IE (06/08/20);    Time 3    Period Weeks    Status Achieved    Target Date 06/29/20             PT Long Term Goals - 06/08/20 1332  PT LONG TERM GOAL #1   Title Be independent with a long-term home exercise program for self-management of symptoms.    Baseline initial HEP provided at IE (06/08/20);    Time 12    Period Weeks    Status New   TARGET DATE FOR ALL LONG TERM GOALS: 08/31/2020     PT LONG TERM GOAL #2   Title Demonstrate improved FOTO score to 63 by visit #11 to demonstrate improvement in overall condition and self-reported functional ability.    Baseline 41 (06/28/2020);    Time 12    Period Weeks    Status New      PT LONG TERM GOAL #3   Title Have full left shoulder AROM with no compensations or increase in pain in all planes except intermittent end range discomfort to allow patient to complete valued activities with less difficulty.    Baseline limited and painfull - see objective exam (06/08/20);    Time 12    Period Weeks    Status New      PT LONG TERM GOAL #4    Title Improve left shoulder strength to 4+/5 with no increase in pain for improved ability to allow patient to complete valued functional tasks such as reaching overhead and crafting with less difficulty.    Baseline limited and painfull - see objective exam (06/08/20);    Time 12    Period Weeks    Status New      PT LONG TERM GOAL #5   Title Complete community, work and/or recreational activities without limitation due to current condition.    Baseline Functional Limitations: difficulty with usual activities that require use of left UE including reaching for things with left hand, turning to the left, dressing, grooming, getting groceries,  cooking, cleaning, cross stitching, sleeping (06/08/2020);    Time 12    Period Weeks    Status New                 Plan - 07/18/20 1017    Clinical Impression Statement Patient tolerated treatment well and is reporting some improvements in pain with reaching movement. Continues to have reproduction of symptoms with manual therapy to the neck but overall improved. Progressed strengthening as tolerated today. Patient would benefit from continued management of limiting condition by skilled physical therapist to address remaining impairments and functional limitations to work towards stated goals and return to PLOF or maximal functional independence.    Personal Factors and Comorbidities Comorbidity 2    Comorbidities Relevant past medical history and comorbidities include carpal tunnel syndrome (maybe undiagnosed left), CHF, Diabetes, hypertension, nonischemic cardiomyopathy, hx of pleural effusion left lung (2018), hx left heart cath and coronary angiography (2018), L foot fracture (fall Oct 2020) wears post op shoe. Patient denies hx of cancer, stroke, seizures, unexplained weight loss, changes in bowel or bladder problems, new onset stumbling or dropping things.    Examination-Activity Limitations Caring for Others    Examination-Participation  Restrictions Cleaning;Laundry;Meal Prep    Stability/Clinical Decision Making Evolving/Moderate complexity    Rehab Potential Poor    PT Frequency 2x / week    PT Duration 12 weeks    PT Treatment/Interventions ADLs/Self Care Home Management;Aquatic Therapy;Cryotherapy;Moist Heat;Electrical Stimulation;Therapeutic activities;Therapeutic exercise;Neuromuscular re-education;Patient/family education;Manual techniques;Passive range of motion;Dry needling;Spinal Manipulations;Joint Manipulations    PT Next Visit Plan manual therapy and exercise as tolerated, update HEP as appropriate    PT Home Exercise Plan medbridge  Access Code: [email or web address]    Consulted and  Agree with Plan of Care Patient           Patient will benefit from skilled therapeutic intervention in order to improve the following deficits and impairments:  Improper body mechanics,Pain,Decreased coordination,Postural dysfunction,Increased muscle spasms,Decreased activity tolerance,Decreased endurance,Decreased range of motion,Decreased strength,Hypomobility,Impaired perceived functional ability,Impaired UE functional use  Visit Diagnosis: Chronic left shoulder pain  Cervicalgia  Paresthesia of skin     Problem List Patient Active Problem List   Diagnosis Date Noted  . Acute pain of left shoulder 05/11/2020  . Pain due to onychomycosis of toenails of both feet 02/25/2020  . Rash and nonspecific skin eruption 11/26/2019  . Screening for malignant neoplasm of breast 08/20/2019  . Screening for osteoporosis 08/20/2019  . Fecal incontinence 01/23/2019  . NICM (nonischemic cardiomyopathy) ([place]) 08/22/2017  . CHF (congestive heart failure) ([place]) 04/03/2017  . Protein-calorie malnutrition, severe 11/02/2016  . Hypokalemia 04/28/2013  . Leukocytosis, unspecified 04/28/2013  . Uncontrolled type 2 diabetes mellitus with hyperglycemia ([place])   . Essential hypertension   . Hypercholesterolemia 11/10/2012    Everlean Alstrom. Graylon Good, PT,  DPT 07/18/20, 10:30 AM  [facility] [address] [place], [address] Phone: [phone]   Fax:  [phone]  Name: Erin Hayes MRN: [id number] Date of Birth: 12-07-1965

## 2020-07-20 ENCOUNTER — Other Ambulatory Visit: Payer: Self-pay

## 2020-07-20 ENCOUNTER — Ambulatory Visit: Payer: Medicaid Other | Admitting: Physical Therapy

## 2020-07-20 ENCOUNTER — Encounter: Payer: Self-pay | Admitting: Physical Therapy

## 2020-07-20 DIAGNOSIS — R202 Paresthesia of skin: Secondary | ICD-10-CM

## 2020-07-20 DIAGNOSIS — M25512 Pain in left shoulder: Secondary | ICD-10-CM

## 2020-07-20 DIAGNOSIS — G8929 Other chronic pain: Secondary | ICD-10-CM

## 2020-07-20 DIAGNOSIS — M542 Cervicalgia: Secondary | ICD-10-CM

## 2020-07-20 NOTE — Therapy (Signed)
[facility] [address], [place], [address] Phone: [phone]   Fax:  [phone]  Physical Therapy Treatment / Progress Note Dates of reporting: 06/08/2020 - 07/20/2020  Patient Details  Name: Erin Hayes MRN: [id number] Date of Birth: [birth year] Referring Provider (PT): Tarri Fuller, FNP   Encounter Date: 07/20/2020   PT End of Session - 07/20/20 0941    Visit Number 10    Number of Visits 24    Date for PT Re-Evaluation 08/31/20    Authorization Type [place] [employer] reporting period from 06/08/2020    Authorization Time Period 27 PT/OT/ST visit per calendar year    Authorization - Visit Number 9    Authorization - Number of Visits 27    Progress Note Due on Visit 10    PT Start Time 0940    PT Stop Time 1025    PT Time Calculation (min) 45 min    Activity Tolerance Patient limited by pain;Patient tolerated treatment well    Behavior During Therapy [facility] for tasks assessed/performed           Past Medical History:  Diagnosis Date  . Carpal tunnel syndrome of right wrist   . Chronic combined systolic (congestive) and diastolic (congestive) heart failure (HCC)    a. 03/2017 Echo: EF 20-25%, diff HK, Gr2 DD; b. 09/2017 Echo: EF 20-25%, diff HK, Gr2 DD.  . Diabetes (HCC)   . GERD (gastroesophageal reflux disease)   . HLD (hyperlipidemia)   . Hypertension   . NICM (nonischemic cardiomyopathy) (HCC)    a. 03/2017 Echo: EF 20-25%, diff HK, Gr2 DD, mild to mod MR, nl RV fxn;  b. 03/2017 Cath: mild nonobs dzs, EF 25%; c. 09/2017 Echo: EF 20-25%, diff HK. Gr2 DD. Mild MR. Mildly reduced RV fxn.  . Pleural effusion, left    a. 03/2017 s/p thoracentesis-->300 ml withdrawn--transudative.  . Pneumonia 02/19/2017    Past Surgical History:  Procedure Laterality Date  . COLONOSCOPY WITH PROPOFOL N/A 01/21/2017   Procedure: COLONOSCOPY WITH PROPOFOL;  Surgeon: Wyline Mood, MD;  Location: [facility]  ENDOSCOPY;  Service: Gastroenterology;  Laterality: N/A;  . ESOPHAGOGASTRODUODENOSCOPY (EGD) WITH PROPOFOL N/A 01/21/2017   Procedure: ESOPHAGOGASTRODUODENOSCOPY (EGD) WITH PROPOFOL;  Surgeon: Wyline Mood, MD;  Location: [facility] ENDOSCOPY;  Service: Gastroenterology;  Laterality: N/A;  . FLEXIBLE SIGMOIDOSCOPY N/A 11/04/2016   Procedure: FLEXIBLE SIGMOIDOSCOPY;  Surgeon: Charlott Rakes, MD;  Location: [facility] ENDOSCOPY;  Service: Endoscopy;  Laterality: N/A;  . LEFT HEART CATH AND CORONARY ANGIOGRAPHY N/A 04/05/2017   Procedure: LEFT HEART CATH AND CORONARY ANGIOGRAPHY;  Surgeon: Iran Ouch, MD;  Location: [facility] INVASIVE CV LAB;  Service: Cardiovascular;  Laterality: N/A;  . LITHOTRIPSY      There were no vitals filed for this visit.   Subjective Assessment - 07/20/20 0942    Subjective Patient reports she is feeling well today and her left shoulder is doing okay. Reports last pain was monday when she was messing around in the house. She felt pretty good after last treatment session. HEP is going well. Continues to have pain when she reaches with the left UE up to 8/10    Pertinent History Patient is a 55 y.o. female who presents to outpatient physical therapy with a referral for medical diagnosis acute pain of left shoulder. This patient's chief complaints consist of left shoulder pain, leading to the following functional deficits: difficulty with usual activities that require use of left  UE including reaching for things with left hand, turning to the left, dressing, grooming, getting groceries,  cooking, cleaning, cross stitching, sleeping.  Relevant past medical history and comorbidities include carpal tunnel syndrome (maybe undiagnosed left), CHF, Diabetes, hypertension, nonischemic cardiomyopathy, hx of pleural effusion left lung (2018), hx left heart cath and coronary angiography (2018), L foot fracture (fall Oct 2020) wears shoe. Patient denies hx of cancer, stroke, seizures, unexplained weight  loss, changes in bowel or bladder problems, new onset stumbling or dropping things.    Limitations Lifting;House hold activities;Other (comment)    Currently in Pain? No/denies    Effect of Pain on Daily Activities Functional Limitations: difficulty with usual activities that require use of left UE including reaching for things with left hand, turning to the left, dressing, grooming, getting groceries, cooking, cleaning, cross stitching, sleeping (06/08/2020). Reports she can do cooking a lot better now, cross stitching is better, dressing is better but reaching and putting shirt on is still difficulty. Turning to the left is fine now (07/20/2020);           OBJECTIVE  FOTO = 51 (07/20/2020);  PERIPHERAL JOINT MOTION (in degrees)  Active Range of Motion (AROM) *Indicates pain 06/08/20 07/20/20 Date  Joint/Motion R/L R/L R/L  Shoulder     Flexion 180/125* /150 /  Extension 75/40* /40 /  Abduction 180/106* /152* /  External rotation 90/62* /78* /  Internal rotation T5/L1* /T10* /  Comments:  06/08/2020: B elbows, wrists WFL and equal bilaterally. Paresthesia in L hand with shoulder ROM.  07/20/2020: L shoulder abduction produces pain at radial side of hand (lingering) and anterior shoulder pain (ERP). Continued pain after abduction. Pain at anterior shoulder (ERP) with ER. pain at anterior shoulder (ERP) with IR.   MUSCLE PERFORMANCE (MMT):  *Indicates pain 06/08/20 07/20/20 Date  Joint/Motion R/L R/L R/L  Shoulder     Flexion 4+/4* 4+/4+ /  Abduction 5/4* 5/5 /  External rotation 4+/4+* 4/4 /  Internal rotation 4+/4+ 4+/4+ /  Elbow     Flexion 4+/3+* 4+/4+ /  Extension 4+/4* 4+/5 /     TREATMENT:  Denies sensitivity to latex Therapeutic exercise:to centralize symptoms and improve ROM, strength, muscular endurance, and activity tolerance required for successful completion of functional activities. -Standing rows with scapular retraction for improved postural and  shoulder girdle strengthening and mobility. Required instruction for technique and cuing to retract, posteriorly tilt, and depress scapulae.2x30 with10# cable.  - standing left shoulder AAROM flexion on wall, 2x20. No pain.  - measurements to assess progress (see above)  (manual therapy - see below)   Manual therapy:to reduce pain and tissue tension, improve range of motion, neuromodulation, in order to promote improved ability to complete functional activities. SUPINE POSITION - Intermittent Cervical spine distraction with STM to posterior cervical spine musculature - STM to left UT and levator, anterior scalenes (very tight and reproduces pain).  - STM to L pectoralis major/minor (tender)  Additional time spent completing [place] Access Code: [email or web address] URL: [email or web address] Date: 07/20/2020 Prepared by: [name]  Exercises Row with band/cable - 1 x daily - 3 sets - 10 reps - 1 second hold Standing Median Nerve Glide - 1 x daily - 3 sets - 10 reps Supine Deep Neck Flexor Training - Repetitions - 1 x daily - 2 sets - 10 reps - 1-5 seconds hold Prone Cervical Retraction - 1 x daily - 2 sets - 10 reps - 1-5 seconds hold Shoulder  Flexion Wall Slide with Towel - 1 x daily - 2 sets - 20 reps    PT Education - 07/20/20 0943    Education Details exercise form technique/form. POC    Person(s) Educated Patient    Methods Explanation;Demonstration;Tactile cues;Verbal cues    Comprehension Verbalized understanding;Returned demonstration;Verbal cues required;Tactile cues required;Need further instruction            PT Short Term Goals - 07/04/20 1306      PT SHORT TERM GOAL #1   Title Be independent with initial home exercise program for self-management of symptoms.    Baseline initial HEP provided at IE (06/08/20);    Time 3    Period Weeks    Status Achieved    Target Date 06/29/20             PT Long Term  Goals - 07/20/20 0943      PT LONG TERM GOAL #1   Title Be independent with a long-term home exercise program for self-management of symptoms.    Baseline initial HEP provided at IE (06/08/20); currently participating well in appropriate HEP (07/20/2020);    Time 12    Period Weeks    Status Partially Met   TARGET DATE FOR ALL LONG TERM GOALS: 08/31/2020     PT LONG TERM GOAL #2   Title Demonstrate improved FOTO score to 63 by visit #11 to demonstrate improvement in overall condition and self-reported functional ability.    Baseline 41 (06/28/2020); 51 at visit 10 (07/20/2020);    Time 12    Period Weeks    Status Partially Met      PT LONG TERM GOAL #3   Title Have full left shoulder AROM with no compensations or increase in pain in all planes except intermittent end range discomfort to allow patient to complete valued activities with less difficulty.    Baseline limited and painfull - see objective exam (06/08/20); improving - see objective exam (07/20/2020);    Time 12    Period Weeks    Status Partially Met      PT LONG TERM GOAL #4   Title Improve left shoulder strength to 4+/5 with no increase in pain for improved ability to allow patient to complete valued functional tasks such as reaching overhead and crafting with less difficulty.    Baseline limited and painfull - see objective exam (06/08/20); improving - see objective exam (07/20/2020);    Time 12    Period Weeks    Status Partially Met      PT LONG TERM GOAL #5   Title Complete community, work and/or recreational activities without limitation due to current condition.    Baseline Functional Limitations: difficulty with usual activities that require use of left UE including reaching for things with left hand, turning to the left, dressing, grooming, getting groceries,  cooking, cleaning, cross stitching, sleeping (06/08/2020);  Reports she can do cooking a lot better now, cross stitching is better, dressing is better but reaching and  putting shirt on is still difficulty. Turning to the left is fine now (07/20/2020);    Time 12    Period Weeks    Status Partially Met                 Plan - 07/20/20 1650    Clinical Impression Statement Patient has attended 10 physical therapy sessions and is making progress towards goals in all areas. Patient has not yet reached her goals or maximal improvement and would  benefit from continued PT. Patient would benefit from continued management of limiting condition by skilled physical therapist to address remaining impairments and functional limitations to work towards stated goals and return to PLOF or maximal functional independence.    Personal Factors and Comorbidities Comorbidity 2    Comorbidities Relevant past medical history and comorbidities include carpal tunnel syndrome (maybe undiagnosed left), CHF, Diabetes, hypertension, nonischemic cardiomyopathy, hx of pleural effusion left lung (2018), hx left heart cath and coronary angiography (2018), L foot fracture (fall Oct 2020) wears post op shoe. Patient denies hx of cancer, stroke, seizures, unexplained weight loss, changes in bowel or bladder problems, new onset stumbling or dropping things.    Examination-Activity Limitations Caring for Others    Examination-Participation Restrictions Cleaning;Laundry;Meal Prep    Stability/Clinical Decision Making Evolving/Moderate complexity    Rehab Potential Poor    PT Frequency 2x / week    PT Duration 12 weeks    PT Treatment/Interventions ADLs/Self Care Home Management;Aquatic Therapy;Cryotherapy;Moist Heat;Electrical Stimulation;Therapeutic activities;Therapeutic exercise;Neuromuscular re-education;Patient/family education;Manual techniques;Passive range of motion;Dry needling;Spinal Manipulations;Joint Manipulations    PT Next Visit Plan manual therapy and exercise as tolerated, update HEP as appropriate    PT Home Exercise Plan medbridge  Access Code: [email or web address]    Consulted and Agree  with Plan of Care Patient           Patient will benefit from skilled therapeutic intervention in order to improve the following deficits and impairments:  Improper body mechanics,Pain,Decreased coordination,Postural dysfunction,Increased muscle spasms,Decreased activity tolerance,Decreased endurance,Decreased range of motion,Decreased strength,Hypomobility,Impaired perceived functional ability,Impaired UE functional use  Visit Diagnosis: Chronic left shoulder pain  Cervicalgia  Paresthesia of skin     Problem List Patient Active Problem List   Diagnosis Date Noted  . Acute pain of left shoulder 05/11/2020  . Pain due to onychomycosis of toenails of both feet 02/25/2020  . Rash and nonspecific skin eruption 11/26/2019  . Screening for malignant neoplasm of breast 08/20/2019  . Screening for osteoporosis 08/20/2019  . Fecal incontinence 01/23/2019  . NICM (nonischemic cardiomyopathy) ([place]) 08/22/2017  . CHF (congestive heart failure) ([place]) 04/03/2017  . Protein-calorie malnutrition, severe 11/02/2016  . Hypokalemia 04/28/2013  . Leukocytosis, unspecified 04/28/2013  . Uncontrolled type 2 diabetes mellitus with hyperglycemia ([place])   . Essential hypertension   . Hypercholesterolemia 11/10/2012    Everlean Alstrom. Graylon Good, PT, DPT 07/20/20, 4:51 PM  [facility] [address] [place], [address] Phone: [phone]   Fax:  [phone]  Name: Erin Hayes MRN: [id number] Date of Birth: 09-04-1965

## 2020-07-25 ENCOUNTER — Encounter: Payer: Self-pay | Admitting: Physical Therapy

## 2020-07-25 ENCOUNTER — Other Ambulatory Visit: Payer: Self-pay

## 2020-07-25 ENCOUNTER — Ambulatory Visit: Payer: Medicaid Other | Admitting: Physical Therapy

## 2020-07-25 DIAGNOSIS — M542 Cervicalgia: Secondary | ICD-10-CM

## 2020-07-25 DIAGNOSIS — R202 Paresthesia of skin: Secondary | ICD-10-CM

## 2020-07-25 DIAGNOSIS — G8929 Other chronic pain: Secondary | ICD-10-CM

## 2020-07-25 DIAGNOSIS — M25512 Pain in left shoulder: Secondary | ICD-10-CM | POA: Diagnosis not present

## 2020-07-25 NOTE — Therapy (Signed)
[facility] [address] [place], [address] Phone: [phone]   Fax:  [phone]  Physical Therapy Treatment  Patient Details  Name: Erin Hayes MRN: [id number] Date of Birth: 1966/04/14 Referring Provider (PT): Verl Bangs, FNP   Encounter Date: 07/25/2020   PT End of Session - 07/25/20 1028    Visit Number 11    Number of Visits 24    Date for PT Re-Evaluation 08/31/20    Authorization Type  [place] reporting period from 07/18/2020    Authorization Time Period 27 PT/OT/ST visit per calendar year    Authorization - Visit Number 10    Authorization - Number of Visits 27    Progress Note Due on Visit 10    PT Start Time [phone]    PT Stop Time 1031    PT Time Calculation (min) 38 min    Activity Tolerance Patient limited by pain;Patient tolerated treatment well    Behavior During Therapy [facility] for tasks assessed/performed           Past Medical History:  Diagnosis Date  . Carpal tunnel syndrome of right wrist   . Chronic combined systolic (congestive) and diastolic (congestive) heart failure ([place])    a. 03/2017 Echo: EF 20-25%, diff HK, Gr2 DD; b. 09/2017 Echo: EF 20-25%, diff HK, Gr2 DD.  . Diabetes ([place])   . GERD (gastroesophageal reflux disease)   . HLD (hyperlipidemia)   . Hypertension   . NICM (nonischemic cardiomyopathy) ([place])    a. 03/2017 Echo: EF 20-25%, diff HK, Gr2 DD, mild to mod MR, nl RV fxn;  b. 03/2017 Cath: mild nonobs dzs, EF 25%; c. 09/2017 Echo: EF 20-25%, diff HK. Gr2 DD. Mild MR. Mildly reduced RV fxn.  . Pleural effusion, left    a. 03/2017 s/p thoracentesis-->300 ml withdrawn--transudative.  . Pneumonia 02/19/2017    Past Surgical History:  Procedure Laterality Date  . COLONOSCOPY WITH PROPOFOL N/A 01/21/2017   Procedure: COLONOSCOPY WITH PROPOFOL;  Surgeon: Jonathon Bellows, MD;  Location: [facility] ENDOSCOPY;  Service: Gastroenterology;  Laterality: N/A;   . ESOPHAGOGASTRODUODENOSCOPY (EGD) WITH PROPOFOL N/A 01/21/2017   Procedure: ESOPHAGOGASTRODUODENOSCOPY (EGD) WITH PROPOFOL;  Surgeon: Jonathon Bellows, MD;  Location: [facility] ENDOSCOPY;  Service: Gastroenterology;  Laterality: N/A;  . FLEXIBLE SIGMOIDOSCOPY N/A 11/04/2016   Procedure: FLEXIBLE SIGMOIDOSCOPY;  Surgeon: Wilford Corner, MD;  Location: [facility] ENDOSCOPY;  Service: Endoscopy;  Laterality: N/A;  . LEFT HEART CATH AND CORONARY ANGIOGRAPHY N/A 04/05/2017   Procedure: LEFT HEART CATH AND CORONARY ANGIOGRAPHY;  Surgeon: Wellington Hampshire, MD;  Location: [facility] CV LAB;  Service: Cardiovascular;  Laterality: N/A;  . LITHOTRIPSY      There were no vitals filed for this visit.   Subjective Assessment - 07/25/20 0956    Subjective Patient reports her "shoulder is killing" her since yesterday. She woke up with 8/10 pain at the left anterior shoulder and down the left arm to her fingers and a little in he neck too. Saturday she watched basketball a lot and is usualy more active. Felt good after her last PT session and was doing well with the updated exercises prior to pain exacerbation.    Pertinent History Patient is a 55 y.o. female who presents to outpatient physical therapy with a referral for medical diagnosis acute pain of left shoulder. This patient's chief complaints consist of left shoulder pain, leading to the following functional deficits: difficulty with usual activities that require  use of left UE including reaching for things with left hand, turning to the left, dressing, grooming, getting groceries,  cooking, cleaning, cross stitching, sleeping.  Relevant past medical history and comorbidities include carpal tunnel syndrome (maybe undiagnosed left), CHF, Diabetes, hypertension, nonischemic cardiomyopathy, hx of pleural effusion left lung (2018), hx left heart cath and coronary angiography (2018), L foot fracture (fall Oct 2020) wears shoe. Patient denies hx of cancer, stroke, seizures,  unexplained weight loss, changes in bowel or bladder problems, new onset stumbling or dropping things.    Limitations Lifting;House hold activities;Other (comment)    Currently in Pain? Yes    Pain Score 8            TREATMENT:  Denies sensitivity to latex   Manual therapy:to reduce pain and tissue tension, improve range of motion, neuromodulation, in order to promote improved ability to complete functional activities. SUPINE POSITION - Intermittent Cervical spine distraction with STM to posterior cervical spine musculature30-45 seconds ~ 10 times.  - STM to left UT and levator, anterior scalenes (very tight and reproduces pain).  - STM to L pectoralis major/minor (tender)  Therapeutic exercise:to centralize symptoms and improve ROM, strength, muscular endurance, and activity tolerance required for successful completion of functional activities. - supine deep neck flexor nod with lift 3x10, 2 second hold (tender at first, feels better as she does more).  - supine chest press with plus, 1x3 with 4#DBs bilaterally (discontinued due to increasing pain) - prone cervical retraction with lift off of rag roll at forehead, 3x10 (mild decrease in pain) -Standing rows with scapular retraction for improved postural and shoulder girdle strengthening and mobility. Required instruction for technique and cuing to retract, posteriorly tilt, and depress scapulae.3x20 with 10# cable.   HOME EXERCISE PROGRAM Access Code: [email or web address] URL: [email or web address] Date: 07/20/2020 Prepared by: Rosabelle Jupin  Exercises Row with band/cable - 1 x daily - 3 sets - 10 reps - 1 second hold Standing Median Nerve Glide - 1 x daily - 3 sets - 10 reps Supine Deep Neck Flexor Training - Repetitions - 1 x daily - 2 sets - 10 reps - 1-5 seconds hold Prone Cervical Retraction - 1 x daily - 2 sets - 10 reps - 1-5 seconds hold Shoulder Flexion Wall Slide with Towel - 1 x daily - 2 sets - 20  reps    PT Education - 07/25/20 1029    Education Details exercise form technique/form. self-management techniques    Person(s) Educated Patient    Methods Explanation;Demonstration;Tactile cues;Verbal cues    Comprehension Verbalized understanding;Verbal cues required;Returned demonstration;Tactile cues required;Need further instruction            PT Short Term Goals - 07/04/20 1306      PT SHORT TERM GOAL #1   Title Be independent with initial home exercise program for self-management of symptoms.    Baseline initial HEP provided at IE (06/08/20);    Time 3    Period Weeks    Status Achieved    Target Date 06/29/20             PT Long Term Goals - 07/20/20 0943      PT LONG TERM GOAL #1   Title Be independent with a long-term home exercise program for self-management of symptoms.    Baseline initial HEP provided at IE (06/08/20); currently participating well in appropriate HEP (07/20/2020);    Time 12    Period Weeks    Status Partially Met   TARGET DATE   FOR ALL LONG TERM GOALS: 08/31/2020     PT LONG TERM GOAL #2   Title Demonstrate improved FOTO score to 63 by visit #11 to demonstrate improvement in overall condition and self-reported functional ability.    Baseline 41 (06/28/2020); 51 at visit 10 (07/20/2020);    Time 12    Period Weeks    Status Partially Met      PT LONG TERM GOAL #3   Title Have full left shoulder AROM with no compensations or increase in pain in all planes except intermittent end range discomfort to allow patient to complete valued activities with less difficulty.    Baseline limited and painfull - see objective exam (06/08/20); improving - see objective exam (07/20/2020);    Time 12    Period Weeks    Status Partially Met      PT LONG TERM GOAL #4   Title Improve left shoulder strength to 4+/5 with no increase in pain for improved ability to allow patient to complete valued functional tasks such as reaching overhead and crafting with less  difficulty.    Baseline limited and painfull - see objective exam (06/08/20); improving - see objective exam (07/20/2020);    Time 12    Period Weeks    Status Partially Met      PT LONG TERM GOAL #5   Title Complete community, work and/or recreational activities without limitation due to current condition.    Baseline Functional Limitations: difficulty with usual activities that require use of left UE including reaching for things with left hand, turning to the left, dressing, grooming, getting groceries,  cooking, cleaning, cross stitching, sleeping (06/08/2020);  Reports she can do cooking a lot better now, cross stitching is better, dressing is better but reaching and putting shirt on is still difficulty. Turning to the left is fine now (07/20/2020);    Time 12    Period Weeks    Status Partially Met                 Plan - 07/25/20 1024    Clinical Impression Statement Patient with pain exacerbation today so regressed exercises appropriately and focused on pain relief with manual therapy. Manual therapy brought pain down to 5/10 (from 8/10) and patient was able to tolerate cervical spine exercises and rows so continued with those and recommended to continue at home until next session or until able to perform additional exercises without increased pain.  Pain gone after rows. Patient would benefit from continued management of limiting condition by skilled physical therapist to address remaining impairments and functional limitations to work towards stated goals and return to PLOF or maximal functional independence.    Personal Factors and Comorbidities Comorbidity 2    Comorbidities Relevant past medical history and comorbidities include carpal tunnel syndrome (maybe undiagnosed left), CHF, Diabetes, hypertension, nonischemic cardiomyopathy, hx of pleural effusion left lung (2018), hx left heart cath and coronary angiography (2018), L foot fracture (fall Oct 2020) wears post op shoe. Patient  denies hx of cancer, stroke, seizures, unexplained weight loss, changes in bowel or bladder problems, new onset stumbling or dropping things.    Examination-Activity Limitations Caring for Others    Examination-Participation Restrictions Cleaning;Laundry;Meal Prep    Stability/Clinical Decision Making Evolving/Moderate complexity    Rehab Potential Poor    PT Frequency 2x / week    PT Duration 12 weeks    PT Treatment/Interventions ADLs/Self Care Home Management;Aquatic Therapy;Cryotherapy;Moist Heat;Electrical Stimulation;Therapeutic activities;Therapeutic exercise;Neuromuscular re-education;Patient/family education;Manual techniques;Passive range of motion;Dry   needling;Spinal Manipulations;Joint Manipulations    PT Next Visit Plan manual therapy and exercise as tolerated, update HEP as appropriate    PT Home Exercise Plan medbridge  Access Code: [email or web address]    Consulted and Agree with Plan of Care Patient           Patient will benefit from skilled therapeutic intervention in order to improve the following deficits and impairments:  Improper body mechanics,Pain,Decreased coordination,Postural dysfunction,Increased muscle spasms,Decreased activity tolerance,Decreased endurance,Decreased range of motion,Decreased strength,Hypomobility,Impaired perceived functional ability,Impaired UE functional use  Visit Diagnosis: Chronic left shoulder pain  Cervicalgia  Paresthesia of skin     Problem List Patient Active Problem List   Diagnosis Date Noted  . Acute pain of left shoulder 05/11/2020  . Pain due to onychomycosis of toenails of both feet 02/25/2020  . Rash and nonspecific skin eruption 11/26/2019  . Screening for malignant neoplasm of breast 08/20/2019  . Screening for osteoporosis 08/20/2019  . Fecal incontinence 01/23/2019  . NICM (nonischemic cardiomyopathy) ([place]) 08/22/2017  . CHF (congestive heart failure) ([place]) 04/03/2017  . Protein-calorie malnutrition, severe 11/02/2016  .  Hypokalemia 04/28/2013  . Leukocytosis, unspecified 04/28/2013  . Uncontrolled type 2 diabetes mellitus with hyperglycemia ([place])   . Essential hypertension   . Hypercholesterolemia 11/10/2012    Everlean Alstrom. Graylon Good, PT, DPT 07/25/20, 10:35 AM  [facility] [address] [place], [address] Phone: [phone]   Fax:  [phone]  Name: RHONDA LINAN MRN: [id number] Date of Birth: [birth year]

## 2020-07-27 ENCOUNTER — Ambulatory Visit: Payer: Medicaid Other | Admitting: Physical Therapy

## 2020-07-27 ENCOUNTER — Other Ambulatory Visit: Payer: Self-pay

## 2020-07-27 ENCOUNTER — Encounter: Payer: Self-pay | Admitting: Physical Therapy

## 2020-07-27 DIAGNOSIS — R202 Paresthesia of skin: Secondary | ICD-10-CM

## 2020-07-27 DIAGNOSIS — G8929 Other chronic pain: Secondary | ICD-10-CM

## 2020-07-27 DIAGNOSIS — M542 Cervicalgia: Secondary | ICD-10-CM

## 2020-07-27 DIAGNOSIS — M25512 Pain in left shoulder: Secondary | ICD-10-CM | POA: Diagnosis not present

## 2020-07-27 NOTE — Therapy (Signed)
[facility] [address], [place], [address] Phone: [phone]   Fax:  [phone]  Physical Therapy Treatment  Patient Details  Name: Erin Hayes MRN: [id number] Date of Birth: Dec 07, 1965 Referring Provider (PT): Verl Bangs, FNP   Encounter Date: 07/27/2020   PT End of Session - 07/27/20 1001    Visit Number 12    Number of Visits 24    Date for PT Re-Evaluation 08/31/20    Authorization Type [place] reporting period from 07/18/2020    Authorization Time Period 27 PT/OT/ST visit per calendar year    Authorization - Visit Number 10    Authorization - Number of Visits 27    Progress Note Due on Visit 10    PT Start Time 0931    PT Stop Time 1011    PT Time Calculation (min) 40 min    Activity Tolerance Patient tolerated treatment well;No increased pain    Behavior During Therapy WFL for tasks assessed/performed           Past Medical History:  Diagnosis Date  . Carpal tunnel syndrome of right wrist   . Chronic combined systolic (congestive) and diastolic (congestive) heart failure ([place])    a. 03/2017 Echo: EF 20-25%, diff HK, Gr2 DD; b. 09/2017 Echo: EF 20-25%, diff HK, Gr2 DD.  . Diabetes ([place])   . GERD (gastroesophageal reflux disease)   . HLD (hyperlipidemia)   . Hypertension   . NICM (nonischemic cardiomyopathy) ([place])    a. 03/2017 Echo: EF 20-25%, diff HK, Gr2 DD, mild to mod MR, nl RV fxn;  b. 03/2017 Cath: mild nonobs dzs, EF 25%; c. 09/2017 Echo: EF 20-25%, diff HK. Gr2 DD. Mild MR. Mildly reduced RV fxn.  . Pleural effusion, left    a. 03/2017 s/p thoracentesis-->300 ml withdrawn--transudative.  . Pneumonia 02/19/2017    Past Surgical History:  Procedure Laterality Date  . COLONOSCOPY WITH PROPOFOL N/A 01/21/2017   Procedure: COLONOSCOPY WITH PROPOFOL;  Surgeon: Jonathon Bellows, MD;  Location[facility] ENDOSCOPY;  Service: Gastroenterology;  Laterality: N/A;  .  ESOPHAGOGASTRODUODENOSCOPY (EGD) WITH PROPOFOL N/A 01/21/2017   Procedure: ESOPHAGOGASTRODUODENOSCOPY (EGD) WITH PROPOFOL;  Surgeon: Jonathon Bellows, MD;  Location: [facility] ENDOSCOPY;  Service: Gastroenterology;  Laterality: N/A;  . FLEXIBLE SIGMOIDOSCOPY N/A 11/04/2016   Procedure: FLEXIBLE SIGMOIDOSCOPY;  Surgeon: Wilford Corner, MD;  Location: [facility] ENDOSCOPY;  Service: Endoscopy;  Laterality: N/A;  . LEFT HEART CATH AND CORONARY ANGIOGRAPHY N/A 04/05/2017   Procedure: LEFT HEART CATH AND CORONARY ANGIOGRAPHY;  Surgeon: Wellington Hampshire, MD;  Location: [facility] CV LAB;  Service: Cardiovascular;  Laterality: N/A;  . LITHOTRIPSY      There were no vitals filed for this visit.   Subjective Assessment - 07/27/20 0934    Subjective Patient reports she is feeling well with no pain upon arrival. Has not had pain since it felt better after her last PT session on monday. Still has a bit of pain when she lifts her left arm. Was able to return to all of her HEP.    Pertinent History Patient is a 55 y.o. female who presents to outpatient physical therapy with a referral for medical diagnosis acute pain of left shoulder. This patient's chief complaints consist of left shoulder pain, leading to the following functional deficits: difficulty with usual activities that require use of left UE including reaching for things with left hand, turning to the left, dressing, grooming, getting groceries,  cooking, cleaning, cross stitching, sleeping.  Relevant past medical history and comorbidities include carpal tunnel syndrome (maybe undiagnosed left), CHF, Diabetes, hypertension, nonischemic cardiomyopathy, hx of pleural effusion left lung (2018), hx left heart cath and coronary angiography (2018), L foot fracture (fall Oct 2020) wears shoe. Patient denies hx of cancer, stroke, seizures, unexplained weight loss, changes in bowel or bladder problems, new onset stumbling or dropping things.    Limitations Lifting;House hold  activities;Other (comment)    Currently in Pain? No/denies           TREATMENT:  Denies sensitivity to latex Therapeutic exercise:to centralize symptoms and improve ROM, strength, muscular endurance, and activity tolerance required for successful completion of functional activities. -Standing rows with scapular retraction for improved postural and shoulder girdle strengthening and mobility. Required instruction for technique and cuing to retract, posteriorly tilt, and depress scapulae.2x30 with10# cable. - standing left shoulder AAROMflexionon wall, 2x20. No pain.   (manual therapy - see below)  - supine deep neck flexor nod with lift 3x10, 5 second hold (feels good). - supine chest press with plus, 2x15 with4#DBs bilaterally.  - sidelying L shoulder abduction to 90 degrees, 2x15/10, 2/3#DB - prone cervical retraction with lift off of rag roll at forehead, 3x10, 5 second hold.  -standingbicep curls 2x15 with4#DBeach side. -Seated LUE [facility] ER, elbow supported on table at 75 degrees shoudler, forearm perpendicular to gravity at start -3x 10 with 4#DB (challenging)   Manual therapy:to reduce pain and tissue tension, improve range of motion, neuromodulation, in order to promote improved ability to complete functional activities. SUPINE POSITION - Intermittent Cervical spine distraction with STM to posterior cervical spine musculature30-45 seconds ~ 10 times.  - STM to left UT and levator, anterior scalenes (very tight and reproduces pain).  - STM to L pectoralis major/minor (tender)  HOME EXERCISE PROGRAM Access Code: [email or web address] URL: [email or web address] Date: 07/20/2020 Prepared by: [name]  Exercises Row with band/cable - 1 x daily - 3 sets - 10 reps - 1 second hold Standing Median Nerve Glide - 1 x daily - 3 sets - 10 reps Supine Deep Neck Flexor Training - Repetitions - 1 x daily - 2 sets - 10 reps - 1-5 seconds hold Prone Cervical  Retraction - 1 x daily - 2 sets - 10 reps - 1-5 seconds hold Shoulder Flexion Wall Slide with Towel - 1 x daily - 2 sets - 20 reps    PT Education - 07/27/20 1000    Education Details exercise form technique/form. self-management techniques    Person(s) Educated Patient    Methods Explanation;Demonstration;Tactile cues;Verbal cues    Comprehension Verbalized understanding;Returned demonstration;Verbal cues required;Tactile cues required;Need further instruction            PT Short Term Goals - 07/04/20 1306      PT SHORT TERM GOAL #1   Title Be independent with initial home exercise program for self-management of symptoms.    Baseline initial HEP provided at IE (06/08/20);    Time 3    Period Weeks    Status Achieved    Target Date 06/29/20             PT Long Term Goals - 07/20/20 0943      PT LONG TERM GOAL #1   Title Be independent with a long-term home exercise program for self-management of symptoms.    Baseline initial HEP provided at IE (06/08/20); currently participating well in appropriate HEP (07/20/2020);    Time 12  Period Weeks    Status Partially Met   TARGET DATE FOR ALL LONG TERM GOALS: 08/31/2020     PT LONG TERM GOAL #2   Title Demonstrate improved FOTO score to 63 by visit #11 to demonstrate improvement in overall condition and self-reported functional ability.    Baseline 41 (06/28/2020); 51 at visit 10 (07/20/2020);    Time 12    Period Weeks    Status Partially Met      PT LONG TERM GOAL #3   Title Have full left shoulder AROM with no compensations or increase in pain in all planes except intermittent end range discomfort to allow patient to complete valued activities with less difficulty.    Baseline limited and painfull - see objective exam (06/08/20); improving - see objective exam (07/20/2020);    Time 12    Period Weeks    Status Partially Met      PT LONG TERM GOAL #4   Title Improve left shoulder strength to 4+/5 with no increase in pain for  improved ability to allow patient to complete valued functional tasks such as reaching overhead and crafting with less difficulty.    Baseline limited and painfull - see objective exam (06/08/20); improving - see objective exam (07/20/2020);    Time 12    Period Weeks    Status Partially Met      PT LONG TERM GOAL #5   Title Complete community, work and/or recreational activities without limitation due to current condition.    Baseline Functional Limitations: difficulty with usual activities that require use of left UE including reaching for things with left hand, turning to the left, dressing, grooming, getting groceries,  cooking, cleaning, cross stitching, sleeping (06/08/2020);  Reports she can do cooking a lot better now, cross stitching is better, dressing is better but reaching and putting shirt on is still difficulty. Turning to the left is fine now (07/20/2020);    Time 12    Period Weeks    Status Partially Met                 Plan - 07/27/20 1000    Clinical Impression Statement Patient demonstrates quick recovery from previous exacerbation with excellent and lasting response to last treatment session. Able to resume shoulder girdle strengthening. Patient would benefit from continued management of limiting condition by skilled physical therapist to address remaining impairments and functional limitations to work towards stated goals and return to PLOF or maximal functional independence.    Personal Factors and Comorbidities Comorbidity 2    Comorbidities Relevant past medical history and comorbidities include carpal tunnel syndrome (maybe undiagnosed left), CHF, Diabetes, hypertension, nonischemic cardiomyopathy, hx of pleural effusion left lung (2018), hx left heart cath and coronary angiography (2018), L foot fracture (fall Oct 2020) wears post op shoe. Patient denies hx of cancer, stroke, seizures, unexplained weight loss, changes in bowel or bladder problems, new onset stumbling or  dropping things.    Examination-Activity Limitations Caring for Others    Examination-Participation Restrictions Cleaning;Laundry;Meal Prep    Stability/Clinical Decision Making Evolving/Moderate complexity    Rehab Potential Poor    PT Frequency 2x / week    PT Duration 12 weeks    PT Treatment/Interventions ADLs/Self Care Home Management;Aquatic Therapy;Cryotherapy;Moist Heat;Electrical Stimulation;Therapeutic activities;Therapeutic exercise;Neuromuscular re-education;Patient/family education;Manual techniques;Passive range of motion;Dry needling;Spinal Manipulations;Joint Manipulations    PT Next Visit Plan manual therapy and exercise as tolerated, update HEP as appropriate    PT Home Exercise Plan [email or web address]  Access Code:  [email or web address]    Consulted and Agree with Plan of Care Patient           Patient will benefit from skilled therapeutic intervention in order to improve the following deficits and impairments:  Improper body mechanics,Pain,Decreased coordination,Postural dysfunction,Increased muscle spasms,Decreased activity tolerance,Decreased endurance,Decreased range of motion,Decreased strength,Hypomobility,Impaired perceived functional ability,Impaired UE functional use  Visit Diagnosis: Chronic left shoulder pain  Cervicalgia  Paresthesia of skin     Problem List Patient Active Problem List   Diagnosis Date Noted  . Acute pain of left shoulder 05/11/2020  . Pain due to onychomycosis of toenails of both feet 02/25/2020  . Rash and nonspecific skin eruption 11/26/2019  . Screening for malignant neoplasm of breast 08/20/2019  . Screening for osteoporosis 08/20/2019  . Fecal incontinence 01/23/2019  . NICM (nonischemic cardiomyopathy) ([place]) 08/22/2017  . CHF (congestive heart failure) ([place]) 04/03/2017  . Protein-calorie malnutrition, severe 11/02/2016  . Hypokalemia 04/28/2013  . Leukocytosis, unspecified 04/28/2013  . Uncontrolled type 2 diabetes mellitus with hyperglycemia  ([place])   . Essential hypertension   . Hypercholesterolemia 11/10/2012   Everlean Alstrom. Graylon Good, PT, DPT 07/27/20, 10:14 AM  [facility] [address] [place], [address] Phone: [phone]   Fax:  [phone]  Name: Erin Hayes MRN: [id number] Date of Birth: 10-Sep-1965

## 2020-08-01 ENCOUNTER — Ambulatory Visit: Payer: Medicaid Other | Admitting: Physical Therapy

## 2020-08-01 ENCOUNTER — Encounter: Payer: Self-pay | Admitting: Physical Therapy

## 2020-08-01 ENCOUNTER — Other Ambulatory Visit: Payer: Self-pay

## 2020-08-01 DIAGNOSIS — M25512 Pain in left shoulder: Secondary | ICD-10-CM | POA: Diagnosis not present

## 2020-08-01 DIAGNOSIS — M542 Cervicalgia: Secondary | ICD-10-CM

## 2020-08-01 DIAGNOSIS — G8929 Other chronic pain: Secondary | ICD-10-CM

## 2020-08-01 DIAGNOSIS — R202 Paresthesia of skin: Secondary | ICD-10-CM

## 2020-08-01 NOTE — Therapy (Signed)
[facility] [address] [place], [address] Phone: [phone]   Fax:  [phone]  Physical Therapy Treatment  Patient Details  Name: Erin Hayes MRN: [id number] Date of Birth: 1965/12/06 Referring Provider (PT): Tarri Fuller, FNP   Encounter Date: 08/01/2020   PT End of Session - 08/01/20 1002    Visit Number 13    Number of Visits 24    Date for PT Re-Evaluation 08/31/20    Authorization Type [place] [employer]ICA[employer]AN reporting period from 07/18/2020    Authorization Time Period 27 PT/OT/ST visit per calendar year    Authorization - Visit Number 12    Authorization - Number of Visits 27    Progress Note Due on Visit 10    PT Start Time 0947    PT Stop Time 1028    PT Time Calculation (min) 41 min    Activity Tolerance Patient tolerated treatment well;No increased pain    Behavior During Therapy WFL for tasks assessed/performed           Past Medical History:  Diagnosis Date  . Carpal tunnel syndrome of right wrist   . Chronic combined systolic (congestive) and diastolic (congestive) heart failure (HCC)    a. 03/2017 Echo: EF 20-25%, diff HK, Gr2 DD; b. 09/2017 Echo: EF 20-25%, diff HK, Gr2 DD.  . Diabetes (HCC)   . GERD (gastroesophageal reflux disease)   . HLD (hyperlipidemia)   . Hypertension   . NICM (nonischemic cardiomyopathy) (HCC)    a. 03/2017 Echo: EF 20-25%, diff HK, Gr2 DD, mild to mod MR, nl RV fxn;  b. 03/2017 Cath: mild nonobs dzs, EF 25%; c. 09/2017 Echo: EF 20-25%, diff HK. Gr2 DD. Mild MR. Mildly reduced RV fxn.  . Pleural effusion, left    a. 03/2017 s/p thoracentesis-->300 ml withdrawn--transudative.  . Pneumonia 02/19/2017    Past Surgical History:  Procedure Laterality Date  . COLONOSCOPY WITH PROPOFOL N/A 01/21/2017   Procedure: COLONOSCOPY WITH PROPOFOL;  Surgeon: Wyline Mood, MD;  Location: [facility] ENDOSCOPY;  Service: Gastroenterology;  Laterality: N/A;  .  ESOPHAGOGASTRODUODENOSCOPY (EGD) WITH PROPOFOL N/A 01/21/2017   Procedure: ESOPHAGOGASTRODUODENOSCOPY (EGD) WITH PROPOFOL;  Surgeon: Wyline Mood, MD;  Location: [facility] ENDOSCOPY;  Service: Gastroenterology;  Laterality: N/A;  . FLEXIBLE SIGMOIDOSCOPY N/A 11/04/2016   Procedure: FLEXIBLE SIGMOIDOSCOPY;  Surgeon: Charlott Rakes, MD;  Location: [facility] ENDOSCOPY;  Service: Endoscopy;  Laterality: N/A;  . LEFT HEART CATH AND CORONARY ANGIOGRAPHY N/A 04/05/2017   Procedure: LEFT HEART CATH AND CORONARY ANGIOGRAPHY;  Surgeon: Iran Ouch, MD;  Location: [facility] INVASIVE CV LAB;  Service: Cardiovascular;  Laterality: N/A;  . LITHOTRIPSY      There were no vitals filed for this visit.   Subjective Assessment - 08/01/20 1024    Subjective Patient reports she is feeling well with no pain upon arrival. States she still has a little bit of pain when she raises her left arm. HEP going well. Felt okay folloiwng last treatment session.    Pertinent History Patient is a 55 y.o. female who presents to outpatient physical therapy with a referral for medical diagnosis acute pain of left shoulder. This patient's chief complaints consist of left shoulder pain, leading to the following functional deficits: difficulty with usual activities that require use of left UE including reaching for things with left hand, turning to the left, dressing, grooming, getting groceries,  cooking, cleaning, cross stitching, sleeping.  Relevant past medical history and  comorbidities include carpal tunnel syndrome (maybe undiagnosed left), CHF, Diabetes, hypertension, nonischemic cardiomyopathy, hx of pleural effusion left lung (2018), hx left heart cath and coronary angiography (2018), L foot fracture (fall Oct 2020) wears shoe. Patient denies hx of cancer, stroke, seizures, unexplained weight loss, changes in bowel or bladder problems, new onset stumbling or dropping things.    Limitations Lifting;House hold activities;Other (comment)     Currently in Pain? No/denies           TREATMENT:  Denies sensitivity to latex Therapeutic exercise:to centralize symptoms and improve ROM, strength, muscular endurance, and activity tolerance required for successful completion of functional activities. -Standing rows with scapular retraction for improved postural and shoulder girdle strengthening and mobility. Required instruction for technique and cuing to retract, posteriorly tilt, and depress scapulae.2x30 with10# cable. - standing left shoulder AAROMflexion and abductionon wall,2x20.No pain. - AROM flexion x1, mild pain at left shoulder  (manual therapy - see below)  - supine deep neck flexor nod with lift 3x10,5second hold (feels good). - supine chest press with plus, 2x15 with5#DBs bilaterally. - sidelying L shoulder abduction to 90 degrees, 2x15,3#DB - prone cervical retraction with lift off of rag roll at forehead, 3x10, 5 second hold.  -standingbicep curls 2x15 with5#DBeach side.  -Seated LUE [facility] ER, elbow supported on table at 75 degrees shoudler, forearm perpendicular to gravity at start -3x 10 with4#DB (challenging)   Manual therapy:to reduce pain and tissue tension, improve range of motion, neuromodulation, in order to promote improved ability to complete functional activities. SUPINE POSITION - Intermittent Cervical spine distraction with STM to posterior cervical spine musculature30-45 seconds ~ 10 times. - STM to left UT and levator, anterior scalenes (very tight and reproduces pain).  - STM to L pectoralis major/minor (tender)  HOME EXERCISE PROGRAM Access Code: [email or web address] URL: [email or web address] Date: 07/20/2020 Prepared by: [name]  Exercises Row with band/cable - 1 x daily - 3 sets - 10 reps - 1 second hold Standing Median Nerve Glide - 1 x daily - 3 sets - 10 reps Supine Deep Neck Flexor Training - Repetitions - 1 x daily - 2 sets - 10 reps - 1-5  seconds hold Prone Cervical Retraction - 1 x daily - 2 sets - 10 reps - 1-5 seconds hold Shoulder Flexion Wall Slide with Towel - 1 x daily - 2 sets - 20 reps     PT Education - 08/01/20 1024    Education Details exercise form technique/form. self-management techniques    Person(s) Educated Patient    Methods Explanation;Demonstration;Tactile cues;Verbal cues    Comprehension Verbalized understanding;Returned demonstration;Verbal cues required;Tactile cues required;Need further instruction            PT Short Term Goals - 07/04/20 1306      PT SHORT TERM GOAL #1   Title Be independent with initial home exercise program for self-management of symptoms.    Baseline initial HEP provided at IE (06/08/20);    Time 3    Period Weeks    Status Achieved    Target Date 06/29/20             PT Long Term Goals - 07/20/20 0943      PT LONG TERM GOAL #1   Title Be independent with a long-term home exercise program for self-management of symptoms.    Baseline initial HEP provided at IE (06/08/20); currently participating well in appropriate HEP (07/20/2020);    Time 12    Period Weeks    Status Partially  Met   TARGET DATE FOR ALL LONG TERM GOALS: 08/31/2020     PT LONG TERM GOAL #2   Title Demonstrate improved FOTO score to 63 by visit #11 to demonstrate improvement in overall condition and self-reported functional ability.    Baseline 41 (06/28/2020); 51 at visit 10 (07/20/2020);    Time 12    Period Weeks    Status Partially Met      PT LONG TERM GOAL #3   Title Have full left shoulder AROM with no compensations or increase in pain in all planes except intermittent end range discomfort to allow patient to complete valued activities with less difficulty.    Baseline limited and painfull - see objective exam (06/08/20); improving - see objective exam (07/20/2020);    Time 12    Period Weeks    Status Partially Met      PT LONG TERM GOAL #4   Title Improve left shoulder strength to 4+/5  with no increase in pain for improved ability to allow patient to complete valued functional tasks such as reaching overhead and crafting with less difficulty.    Baseline limited and painfull - see objective exam (06/08/20); improving - see objective exam (07/20/2020);    Time 12    Period Weeks    Status Partially Met      PT LONG TERM GOAL #5   Title Complete community, work and/or recreational activities without limitation due to current condition.    Baseline Functional Limitations: difficulty with usual activities that require use of left UE including reaching for things with left hand, turning to the left, dressing, grooming, getting groceries,  cooking, cleaning, cross stitching, sleeping (06/08/2020);  Reports she can do cooking a lot better now, cross stitching is better, dressing is better but reaching and putting shirt on is still difficulty. Turning to the left is fine now (07/20/2020);    Time 12    Period Weeks    Status Partially Met                 Plan - 08/01/20 1023    Clinical Impression Statement Patient continues to tolerate PT well and progress towards goals. Does still have pain with overhead movement of L UE but is progressing in activity tolerance with larger weight. Patient would benefit from continued management of limiting condition by skilled physical therapist to address remaining impairments and functional limitations to work towards stated goals and return to PLOF or maximal functional independence.    Personal Factors and Comorbidities Comorbidity 2    Comorbidities Relevant past medical history and comorbidities include carpal tunnel syndrome (maybe undiagnosed left), CHF, Diabetes, hypertension, nonischemic cardiomyopathy, hx of pleural effusion left lung (2018), hx left heart cath and coronary angiography (2018), L foot fracture (fall Oct 2020) wears post op shoe. Patient denies hx of cancer, stroke, seizures, unexplained weight loss, changes in bowel or  bladder problems, new onset stumbling or dropping things.    Examination-Activity Limitations Caring for Others    Examination-Participation Restrictions Cleaning;Laundry;Meal Prep    Stability/Clinical Decision Making Evolving/Moderate complexity    Rehab Potential Poor    PT Frequency 2x / week    PT Duration 12 weeks    PT Treatment/Interventions ADLs/Self Care Home Management;Aquatic Therapy;Cryotherapy;Moist Heat;Electrical Stimulation;Therapeutic activities;Therapeutic exercise;Neuromuscular re-education;Patient/family education;Manual techniques;Passive range of motion;Dry needling;Spinal Manipulations;Joint Manipulations    PT Next Visit Plan manual therapy and exercise as tolerated, update HEP as appropriate    PT Home Exercise Plan [email or web address]  Access Code:  [email or web address]    Consulted and Agree with Plan of Care Patient           Patient will benefit from skilled therapeutic intervention in order to improve the following deficits and impairments:  Improper body mechanics,Pain,Decreased coordination,Postural dysfunction,Increased muscle spasms,Decreased activity tolerance,Decreased endurance,Decreased range of motion,Decreased strength,Hypomobility,Impaired perceived functional ability,Impaired UE functional use  Visit Diagnosis: Chronic left shoulder pain  Cervicalgia  Paresthesia of skin     Problem List Patient Active Problem List   Diagnosis Date Noted  . Acute pain of left shoulder 05/11/2020  . Pain due to onychomycosis of toenails of both feet 02/25/2020  . Rash and nonspecific skin eruption 11/26/2019  . Screening for malignant neoplasm of breast 08/20/2019  . Screening for osteoporosis 08/20/2019  . Fecal incontinence 01/23/2019  . NICM (nonischemic cardiomyopathy) ([place]) 08/22/2017  . CHF (congestive heart failure) ([place]) 04/03/2017  . Protein-calorie malnutrition, severe 11/02/2016  . Hypokalemia 04/28/2013  . Leukocytosis, unspecified 04/28/2013  . Uncontrolled  type 2 diabetes mellitus with hyperglycemia ([place])   . Essential hypertension   . Hypercholesterolemia 11/10/2012   Everlean Alstrom. Graylon Good, PT, DPT 08/01/20, 10:26 AM  [facility] [address] [place], [address] Phone: [phone]   Fax:  [phone]  Name: Erin Hayes MRN: [id number] Date of Birth: [birth year]

## 2020-08-03 ENCOUNTER — Ambulatory Visit: Payer: Medicaid Other | Admitting: Physical Therapy

## 2020-08-03 ENCOUNTER — Encounter: Payer: Self-pay | Admitting: Physical Therapy

## 2020-08-03 ENCOUNTER — Other Ambulatory Visit: Payer: Self-pay

## 2020-08-03 DIAGNOSIS — R202 Paresthesia of skin: Secondary | ICD-10-CM

## 2020-08-03 DIAGNOSIS — M25512 Pain in left shoulder: Secondary | ICD-10-CM

## 2020-08-03 DIAGNOSIS — M542 Cervicalgia: Secondary | ICD-10-CM

## 2020-08-03 DIAGNOSIS — G8929 Other chronic pain: Secondary | ICD-10-CM

## 2020-08-03 NOTE — Therapy (Signed)
[facility] [address], [place], [address] Phone: [phone]   Fax:  [phone]  Physical Therapy Treatment  Patient Details  Name: CAMILA MAITA MRN: [id number] Date of Birth: 1966-02-02 Referring Provider (PT): Verl Bangs, FNP   Encounter Date: 08/03/2020   PT End of Session - 08/03/20 1007    Visit Number 14    Number of Visits 24    Date for PT Re-Evaluation 08/31/20    Authorization Type [employer] reporting period from 07/18/2020    Authorization Time Period 27 PT/OT/ST visit per calendar year    Authorization - Visit Number 13    Authorization - Number of Visits 27    Progress Note Due on Visit 10    PT Start Time 0955    PT Stop Time 1033    PT Time Calculation (min) 38 min    Activity Tolerance Patient tolerated treatment well;No increased pain    Behavior During Therapy WFL for tasks assessed/performed           Past Medical History:  Diagnosis Date  . Carpal tunnel syndrome of right wrist   . Chronic combined systolic (congestive) and diastolic (congestive) heart failure ([place])    a. 03/2017 Echo: EF 20-25%, diff HK, Gr2 DD; b. 09/2017 Echo: EF 20-25%, diff HK, Gr2 DD.  . Diabetes ([place])   . GERD (gastroesophageal reflux disease)   . HLD (hyperlipidemia)   . Hypertension   . NICM (nonischemic cardiomyopathy) ([place])    a. 03/2017 Echo: EF 20-25%, diff HK, Gr2 DD, mild to mod MR, nl RV fxn;  b. 03/2017 Cath: mild nonobs dzs, EF 25%; c. 09/2017 Echo: EF 20-25%, diff HK. Gr2 DD. Mild MR. Mildly reduced RV fxn.  . Pleural effusion, left    a. 03/2017 s/p thoracentesis-->300 ml withdrawn--transudative.  . Pneumonia 02/19/2017    Past Surgical History:  Procedure Laterality Date  . COLONOSCOPY WITH PROPOFOL N/A 01/21/2017   Procedure: COLONOSCOPY WITH PROPOFOL;  Surgeon: Jonathon Bellows, MD;  Location: [facility] ENDOSCOPY;  Service: Gastroenterology;  Laterality: N/A;  .  ESOPHAGOGASTRODUODENOSCOPY (EGD) WITH PROPOFOL N/A 01/21/2017   Procedure: ESOPHAGOGASTRODUODENOSCOPY (EGD) WITH PROPOFOL;  Surgeon: Jonathon Bellows, MD;  Location: [facility] ENDOSCOPY;  Service: Gastroenterology;  Laterality: N/A;  . FLEXIBLE SIGMOIDOSCOPY N/A 11/04/2016   Procedure: FLEXIBLE SIGMOIDOSCOPY;  Surgeon: Wilford Corner, MD;  Location: [facility] ENDOSCOPY;  Service: Endoscopy;  Laterality: N/A;  . LEFT HEART CATH AND CORONARY ANGIOGRAPHY N/A 04/05/2017   Procedure: LEFT HEART CATH AND CORONARY ANGIOGRAPHY;  Surgeon: Wellington Hampshire, MD;  Location: [facility];  Service: Cardiovascular;  Laterality: N/A;  . LITHOTRIPSY      There were no vitals filed for this visit.   Subjective Assessment - 08/03/20 0958    Subjective Patient reports no pain upon arrival but states it continues to go up to 3-4/10 when she raises her L arm overhead. States she has not had any paresthesia for a few days. Felt good after last treatment session.    Pertinent History Patient is a 55 y.o. female who presents to outpatient physical therapy with a referral for medical diagnosis acute pain of left shoulder. This patient's chief complaints consist of left shoulder pain, leading to the following functional deficits: difficulty with usual activities that require use of left UE including reaching for things with left hand, turning to the left, dressing, grooming, getting groceries,  cooking, cleaning, cross stitching, sleeping.  Relevant  past medical history and comorbidities include carpal tunnel syndrome (maybe undiagnosed left), CHF, Diabetes, hypertension, nonischemic cardiomyopathy, hx of pleural effusion left lung (2018), hx left heart cath and coronary angiography (2018), L foot fracture (fall Oct 2020) wears shoe. Patient denies hx of cancer, stroke, seizures, unexplained weight loss, changes in bowel or bladder problems, new onset stumbling or dropping things.    Limitations Lifting;House hold activities;Other  (comment)    Currently in Pain? No/denies             TREATMENT:  Denies sensitivity to latex Therapeutic exercise:to centralize symptoms and improve ROM, strength, muscular endurance, and activity tolerance required for successful completion of functional activities. -Standing rows with scapular retraction for improved postural and shoulder girdle strengthening and mobility. Required instruction for technique and cuing to retract, posteriorly tilt, and depress scapulae.2x30 with10# cable. - standing left shoulder AAROMflexion and abductionon wall,2x20 each direction.  No pain. - overhead Y lift off wall, alternating sides with 1# DB, 2x10 each side.  - standing L shoulder abduction to 90 degrees with 2# DB, 2x10 - seated overhead press, 2x10 with 1# DB - standing shoulder abduction to 90 degrees (palms down), 2x10 with 1#DB. (attempted 2# DB but discontinued due to worsening pain on L).  -standingbicep curls2x15with5#DBeach side (challenging) -Seated LUE [facility] ER, elbow supported on table at 75 degrees shoudler, forearm perpendicular to gravity at start -3x 10 with4#DB(challenging) - prone cervical retraction with lift off of rag roll at forehead, 2x15, 5 second hold. - supine chest press with plus,2x15with5#DBs bilaterally. - supine deep neck flexor nod with lift 3x10,5second hold (feels good). - Education on HEP including handout   Manual therapy:to reduce pain and tissue tension, improve range of motion, neuromodulation, in order to promote improved ability to complete functional activities. SUPINE POSITION - Intermittent Cervical spine distraction with STM to posterior cervical spine musculature30-45 seconds ~ 10 times. - STM to left UT and levator, anterior scalenes (very tight and reproduces pain).  - STM to L pectoralis major/minor (tender)  HOME EXERCISE PROGRAM Access Code: [email or web address] URL: [email or web address] Date:  08/03/2020 Prepared by: [name]  Exercises Row with band/cable - 1 x daily - 1 sets - 30 reps - 1 second hold Supine Deep Neck Flexor Training - Repetitions - 1 x daily - 2 sets - 10 reps - 1-5 seconds hold Prone Cervical Retraction - 1 x daily - 2 sets - 10 reps - 1-5 seconds hold Shoulder Flexion Wall Slide with Towel - 1 x daily - 1 sets - 20 reps Standing Shoulder Abduction Slides at Wall - 1 x daily - 1 sets - 20 reps Shoulder Abduction with Dumbbells - Palms Down - 1 x daily - 2-3 sets - 10-15 reps  [email or web address] SS [id number] Home Exercise Program [[id number]] "Y" Lower Trap lift off at wall -  Repeat 10 Times, Complete 2 Sets, Perform 1 Times a Day    PT Education - 08/03/20 1007    Education Details exercise form technique/form. self-management techniques    Person(s) Educated Patient    Methods Explanation;Demonstration;Tactile cues;Verbal cues    Comprehension Verbalized understanding;Returned demonstration;Verbal cues required;Tactile cues required;Need further instruction            PT Short Term Goals - 07/04/20 1306      PT SHORT TERM GOAL #1   Title Be independent with initial home exercise program for self-management of symptoms.    Baseline initial HEP provided at IE (06/08/20);    Time 3  Period Weeks    Status Achieved    Target Date 06/29/20             PT Long Term Goals - 07/20/20 0943      PT LONG TERM GOAL #1   Title Be independent with a long-term home exercise program for self-management of symptoms.    Baseline initial HEP provided at IE (06/08/20); currently participating well in appropriate HEP (07/20/2020);    Time 12    Period Weeks    Status Partially Met   TARGET DATE FOR ALL LONG TERM GOALS: 08/31/2020     PT LONG TERM GOAL #2   Title Demonstrate improved FOTO score to 63 by visit #11 to demonstrate improvement in overall condition and self-reported functional ability.    Baseline 41 (06/28/2020); 51 at visit 10 (07/20/2020);    Time 12     Period Weeks    Status Partially Met      PT LONG TERM GOAL #3   Title Have full left shoulder AROM with no compensations or increase in pain in all planes except intermittent end range discomfort to allow patient to complete valued activities with less difficulty.    Baseline limited and painfull - see objective exam (06/08/20); improving - see objective exam (07/20/2020);    Time 12    Period Weeks    Status Partially Met      PT LONG TERM GOAL #4   Title Improve left shoulder strength to 4+/5 with no increase in pain for improved ability to allow patient to complete valued functional tasks such as reaching overhead and crafting with less difficulty.    Baseline limited and painfull - see objective exam (06/08/20); improving - see objective exam (07/20/2020);    Time 12    Period Weeks    Status Partially Met      PT LONG TERM GOAL #5   Title Complete community, work and/or recreational activities without limitation due to current condition.    Baseline Functional Limitations: difficulty with usual activities that require use of left UE including reaching for things with left hand, turning to the left, dressing, grooming, getting groceries,  cooking, cleaning, cross stitching, sleeping (06/08/2020);  Reports she can do cooking a lot better now, cross stitching is better, dressing is better but reaching and putting shirt on is still difficulty. Turning to the left is fine now (07/20/2020);    Time 12    Period Weeks    Status Partially Met                 Plan - 08/03/20 1222    Clinical Impression Statement Patient tolerated treatment well and continues to progress towards goals. Has much improved pain and paresthesia but continues to have pain limitations resembling painful arc. Progressed to more overhead movements without pain and updated HEP to reflect progression. Patient would benefit from continued management of limiting condition by skilled physical therapist to address remaining  impairments and functional limitations to work towards stated goals and return to PLOF or maximal functional independence.    Personal Factors and Comorbidities Comorbidity 2    Comorbidities Relevant past medical history and comorbidities include carpal tunnel syndrome (maybe undiagnosed left), CHF, Diabetes, hypertension, nonischemic cardiomyopathy, hx of pleural effusion left lung (2018), hx left heart cath and coronary angiography (2018), L foot fracture (fall Oct 2020) wears post op shoe. Patient denies hx of cancer, stroke, seizures, unexplained weight loss, changes in bowel or bladder problems, new onset stumbling  or dropping things.    Examination-Activity Limitations Caring for Others    Examination-Participation Restrictions Cleaning;Laundry;Meal Prep    Stability/Clinical Decision Making Evolving/Moderate complexity    Rehab Potential Poor    PT Frequency 2x / week    PT Duration 12 weeks    PT Treatment/Interventions ADLs/Self Care Home Management;Aquatic Therapy;Cryotherapy;Moist Heat;Electrical Stimulation;Therapeutic activities;Therapeutic exercise;Neuromuscular re-education;Patient/family education;Manual techniques;Passive range of motion;Dry needling;Spinal Manipulations;Joint Manipulations    PT Next Visit Plan manual therapy and exercise as tolerated, update HEP as appropriate    PT Home Exercise Plan medbridge  Access Code: [email or web address]    Consulted and Agree with Plan of Care Patient           Patient will benefit from skilled therapeutic intervention in order to improve the following deficits and impairments:  Improper body mechanics,Pain,Decreased coordination,Postural dysfunction,Increased muscle spasms,Decreased activity tolerance,Decreased endurance,Decreased range of motion,Decreased strength,Hypomobility,Impaired perceived functional ability,Impaired UE functional use  Visit Diagnosis: Chronic left shoulder pain  Cervicalgia  Paresthesia of skin     Problem  List Patient Active Problem List   Diagnosis Date Noted  . Acute pain of left shoulder 05/11/2020  . Pain due to onychomycosis of toenails of both feet 02/25/2020  . Rash and nonspecific skin eruption 11/26/2019  . Screening for malignant neoplasm of breast 08/20/2019  . Screening for osteoporosis 08/20/2019  . Fecal incontinence 01/23/2019  . NICM (nonischemic cardiomyopathy) ([place]) 08/22/2017  . CHF (congestive heart failure) ([place]) 04/03/2017  . Protein-calorie malnutrition, severe 11/02/2016  . Hypokalemia 04/28/2013  . Leukocytosis, unspecified 04/28/2013  . Uncontrolled type 2 diabetes mellitus with hyperglycemia ([place])   . Essential hypertension   . Hypercholesterolemia 11/10/2012    Everlean Alstrom. Graylon Good, PT, DPT 08/03/20, 12:22 PM  [facility] [address], [place], [address] Phone: [phone]   Fax:  [phone]  Name: KYNSLEE BAHAM MRN: [id number] Date of Birth: January 24, 1966

## 2020-08-10 ENCOUNTER — Other Ambulatory Visit: Payer: Self-pay

## 2020-08-10 ENCOUNTER — Ambulatory Visit: Payer: Medicare Other | Attending: Family Medicine | Admitting: Physical Therapy

## 2020-08-10 ENCOUNTER — Encounter: Payer: Self-pay | Admitting: Physical Therapy

## 2020-08-10 DIAGNOSIS — M25512 Pain in left shoulder: Secondary | ICD-10-CM | POA: Insufficient documentation

## 2020-08-10 DIAGNOSIS — M542 Cervicalgia: Secondary | ICD-10-CM | POA: Diagnosis present

## 2020-08-10 DIAGNOSIS — M6281 Muscle weakness (generalized): Secondary | ICD-10-CM | POA: Diagnosis present

## 2020-08-10 DIAGNOSIS — M79672 Pain in left foot: Secondary | ICD-10-CM | POA: Insufficient documentation

## 2020-08-10 DIAGNOSIS — G8929 Other chronic pain: Secondary | ICD-10-CM | POA: Insufficient documentation

## 2020-08-10 DIAGNOSIS — R202 Paresthesia of skin: Secondary | ICD-10-CM | POA: Diagnosis present

## 2020-08-10 DIAGNOSIS — R262 Difficulty in walking, not elsewhere classified: Secondary | ICD-10-CM | POA: Insufficient documentation

## 2020-08-10 DIAGNOSIS — M25675 Stiffness of left foot, not elsewhere classified: Secondary | ICD-10-CM | POA: Diagnosis present

## 2020-08-10 NOTE — Therapy (Signed)
[facility] [address], [place], [address] Phone: [phone]   Fax:  [phone]  Physical Therapy Treatment  Patient Details  Name: Erin Hayes MRN: [id number] Date of Birth: [birth year] Referring Provider (PT): Verl Bangs, FNP   Encounter Date: 08/10/2020   PT End of Session - 08/10/20 0934    Visit Number 15    Number of Visits 24    Date for PT Re-Evaluation 08/31/20    Authorization Type [place] reporting period from 07/18/2020    Authorization Time Period 27 PT/OT/ST visit per calendar year    Authorization - Visit Number 15    Authorization - Number of Visits 27    Progress Note Due on Visit 20    PT Start Time 0903    PT Stop Time 1000    PT Time Calculation (min) 57 min    Activity Tolerance Patient tolerated treatment well;No increased pain    Behavior During Therapy WFL for tasks assessed/performed           Past Medical History:  Diagnosis Date  . Carpal tunnel syndrome of right wrist   . Chronic combined systolic (congestive) and diastolic (congestive) heart failure ([place])    a. 03/2017 Echo: EF 20-25%, diff HK, Gr2 DD; b. 09/2017 Echo: EF 20-25%, diff HK, Gr2 DD.  . Diabetes ([place])   . GERD (gastroesophageal reflux disease)   . HLD (hyperlipidemia)   . Hypertension   . NICM (nonischemic cardiomyopathy) ([place])    a. 03/2017 Echo: EF 20-25%, diff HK, Gr2 DD, mild to mod MR, nl RV fxn;  b. 03/2017 Cath: mild nonobs dzs, EF 25%; c. 09/2017 Echo: EF 20-25%, diff HK. Gr2 DD. Mild MR. Mildly reduced RV fxn.  . Pleural effusion, left    a. 03/2017 s/p thoracentesis-->300 ml withdrawn--transudative.  . Pneumonia 02/19/2017    Past Surgical History:  Procedure Laterality Date  . COLONOSCOPY WITH PROPOFOL N/A 01/21/2017   Procedure: COLONOSCOPY WITH PROPOFOL;  Surgeon: Jonathon Bellows, MD;  Location: [facility] ENDOSCOPY;  Service: Gastroenterology;  Laterality: N/A;  .  ESOPHAGOGASTRODUODENOSCOPY (EGD) WITH PROPOFOL N/A 01/21/2017   Procedure: ESOPHAGOGASTRODUODENOSCOPY (EGD) WITH PROPOFOL;  Surgeon: Jonathon Bellows, MD;  Location: [facility] ENDOSCOPY;  Service: Gastroenterology;  Laterality: N/A;  . FLEXIBLE SIGMOIDOSCOPY N/A 11/04/2016   Procedure: FLEXIBLE SIGMOIDOSCOPY;  Surgeon: Wilford Corner, MD;  Location: [facility] ENDOSCOPY;  Service: Endoscopy;  Laterality: N/A;  . LEFT HEART CATH AND CORONARY ANGIOGRAPHY N/A 04/05/2017   Procedure: LEFT HEART CATH AND CORONARY ANGIOGRAPHY;  Surgeon: Wellington Hampshire, MD;  Location: [facility];  Service: Cardiovascular;  Laterality: N/A;  . LITHOTRIPSY      There were no vitals filed for this visit.    Subjective Assessment - 08/10/20 0905    Subjective Patient reports no pain upon arrival at rest, but states she "hurt it a little bit" getting out of the car and moved her L arm behind her. Prior to that the last time she had pain she was lying in bed on monday and rolled over on it wrong. States she felt okay following last treatment session. Upon discussion of POC, insurance visit limitations, progress, patient would like to work at becoming more independent with plan to discharge shoulder pain soon so she can start PT for her foot.    Pertinent History Patient is a 55 y.o. female who presents to outpatient physical therapy with a referral for medical diagnosis acute  pain of left shoulder. This patient's chief complaints consist of left shoulder pain, leading to the following functional deficits: difficulty with usual activities that require use of left UE including reaching for things with left hand, turning to the left, dressing, grooming, getting groceries,  cooking, cleaning, cross stitching, sleeping.  Relevant past medical history and comorbidities include carpal tunnel syndrome (maybe undiagnosed left), CHF, Diabetes, hypertension, nonischemic cardiomyopathy, hx of pleural effusion left lung (2018), hx left heart cath  and coronary angiography (2018), L foot fracture (fall Oct 2020) wears shoe. Patient denies hx of cancer, stroke, seizures, unexplained weight loss, changes in bowel or bladder problems, new onset stumbling or dropping things.    Limitations Lifting;House hold activities;Other (comment)    Currently in Pain? No/denies          OBJECTIVE FOTO = 63 (08/10/2020)   TREATMENT:  Denies sensitivity to latex Therapeutic exercise:to centralize symptoms and improve ROM, strength, muscular endurance, and activity tolerance required for successful completion of functional activities. -Standing rows with scapular retraction for improved postural and shoulder girdle strengthening and mobility. Required instruction for technique and cuing to retract, posteriorly tilt, and depress scapulae.2x30 with10# cable. - standing left shoulder AAROMflexionand abductionon wall,1x20 each direction.  No pain. - moving 10 cones up and down from shelf just over head height with L UE. (no pain).  - moving 9 weights of 1-2.2# up and down from shelf just over head height with L UE (no pain).  - overhead Y lift off wall, alternating sides with 2/3/2# DB, 3x10 each side.  - lift of 8# DB to shelf overhead (and back down) x1 (a bit painful and difficult). Used both UEs.  - standing L shoulder AAROM extension with stick, 1x20 to end range (no pain) - standing B shoulder abduction to 90 degrees palms down with 2# DB, 3x10/15/10 (attmepted 3# but painful and weak, mild pain with 15 reps) - seated overhead press, 3x10 with 1/2/3# DB -standingbicep curls2x15with5#DBeach side (challenging) -Seated LUE GHJ ER, elbow supported on table at 75 degrees shoudler, forearm perpendicular to gravity at start -2x15 with4#DB(challenging) - trial of standing abduction, seated OH press, and seated L ER at 80 degrees abduction with bands and took video on pt's phone  for HEP. - Education on HEP including handout   Pt  required multimodal cuing for proper technique and to facilitate improved neuromuscular control, strength, range of motion, and functional ability resulting in improved performance and form.  HOME EXERCISE PROGRAM Access Code: [email or web address] URL: [email or web address] Date: 08/10/2020 Prepared by: [name]  Exercises Row with band/cable - 1 x daily - 1 sets - 30 reps - 1 second hold Supine Deep Neck Flexor Training - Repetitions - 1 x daily - 2 sets - 10 reps - 1-5 seconds hold Prone Cervical Retraction - 1 x daily - 2 sets - 10 reps - 1-5 seconds hold Shoulder Flexion Wall Slide with Towel - 1 x daily - 1 sets - 20 reps Standing Shoulder Abduction Slides at Wall - 1 x daily - 1 sets - 20 reps Standing Shoulder Abduction with Resistance - 3 x weekly - 2-3 sets - 10-15 reps CLX Shoulder Overhead Press with Hand Closed - 3 x weekly - 2-3 sets - 10-15 reps Seated Shoulder External Rotation in Abduction Supported with Resistance - 3 x weekly - 2-3 sets - 10-15 reps  [email or web address] SS [id number] Home Exercise Program [[id number]] "Y" Lower Trap lift off at wall -  Repeat 10 Times, Complete 2 Sets,  Perform 1 Times a Day    PT Education - 08/10/20 1009    Education Details exercise form technique/form. self-management techniques. POC.    Person(s) Educated Patient    Methods Explanation;Demonstration;Tactile cues;Verbal cues;Handout    Comprehension Verbalized understanding;Returned demonstration;Verbal cues required;Tactile cues required;Need further instruction            PT Short Term Goals - 07/04/20 1306      PT SHORT TERM GOAL #1   Title Be independent with initial home exercise program for self-management of symptoms.    Baseline initial HEP provided at IE (06/08/20);    Time 3    Period Weeks    Status Achieved    Target Date 06/29/20             PT Long Term Goals - 07/20/20 0943      PT LONG TERM GOAL #1   Title Be independent with a long-term home exercise  program for self-management of symptoms.    Baseline initial HEP provided at IE (06/08/20); currently participating well in appropriate HEP (07/20/2020);    Time 12    Period Weeks    Status Partially Met   TARGET DATE FOR ALL LONG TERM GOALS: 08/31/2020     PT LONG TERM GOAL #2   Title Demonstrate improved FOTO score to 63 by visit #11 to demonstrate improvement in overall condition and self-reported functional ability.    Baseline 41 (06/28/2020); 51 at visit 10 (07/20/2020);    Time 12    Period Weeks    Status Partially Met      PT LONG TERM GOAL #3   Title Have full left shoulder AROM with no compensations or increase in pain in all planes except intermittent end range discomfort to allow patient to complete valued activities with less difficulty.    Baseline limited and painfull - see objective exam (06/08/20); improving - see objective exam (07/20/2020);    Time 12    Period Weeks    Status Partially Met      PT LONG TERM GOAL #4   Title Improve left shoulder strength to 4+/5 with no increase in pain for improved ability to allow patient to complete valued functional tasks such as reaching overhead and crafting with less difficulty.    Baseline limited and painfull - see objective exam (06/08/20); improving - see objective exam (07/20/2020);    Time 12    Period Weeks    Status Partially Met      PT LONG TERM GOAL #5   Title Complete community, work and/or recreational activities without limitation due to current condition.    Baseline Functional Limitations: difficulty with usual activities that require use of left UE including reaching for things with left hand, turning to the left, dressing, grooming, getting groceries,  cooking, cleaning, cross stitching, sleeping (06/08/2020);  Reports she can do cooking a lot better now, cross stitching is better, dressing is better but reaching and putting shirt on is still difficulty. Turning to the left is fine now (07/20/2020);    Time 12    Period  Weeks    Status Partially Met                 Plan - 08/10/20 1013    Clinical Impression Statement Patient continues to advance in activity tolerance and strength but is not yet pain free or unlimited in daily activities. Would like to be able to put gallon of milk on overhead shelf with less difficulty/pain.  FOTO score to goal of 63 this session. Focused on progression in strengthening as tolerated and update in HEP to be more independent with strengthening. Plan to assess for success and adequacy of updated HEP next session with plan to phase out PT visits as able to maintain progress and transition to independent management. Patient would benefit from continued management of limiting condition by skilled physical therapist to address remaining impairments and functional limitations to work towards stated goals and return to PLOF or maximal functional independence.    Personal Factors and Comorbidities Comorbidity 2    Comorbidities Relevant past medical history and comorbidities include carpal tunnel syndrome (maybe undiagnosed left), CHF, Diabetes, hypertension, nonischemic cardiomyopathy, hx of pleural effusion left lung (2018), hx left heart cath and coronary angiography (2018), L foot fracture (fall Oct 2020) wears post op shoe. Patient denies hx of cancer, stroke, seizures, unexplained weight loss, changes in bowel or bladder problems, new onset stumbling or dropping things.    Examination-Activity Limitations Caring for Others    Examination-Participation Restrictions Cleaning;Laundry;Meal Prep    Stability/Clinical Decision Making Evolving/Moderate complexity    Rehab Potential Poor    PT Frequency 2x / week    PT Duration 12 weeks    PT Treatment/Interventions ADLs/Self Care Home Management;Aquatic Therapy;Cryotherapy;Moist Heat;Electrical Stimulation;Therapeutic activities;Therapeutic exercise;Neuromuscular re-education;Patient/family education;Manual techniques;Passive range of  motion;Dry needling;Spinal Manipulations;Joint Manipulations    PT Next Visit Plan transition to independent management    PT Home Exercise Plan medbridge  Access Code: [email or web address]    Consulted and Agree with Plan of Care Patient           Patient will benefit from skilled therapeutic intervention in order to improve the following deficits and impairments:  Improper body mechanics,Pain,Decreased coordination,Postural dysfunction,Increased muscle spasms,Decreased activity tolerance,Decreased endurance,Decreased range of motion,Decreased strength,Hypomobility,Impaired perceived functional ability,Impaired UE functional use  Visit Diagnosis: Chronic left shoulder pain  Cervicalgia  Paresthesia of skin     Problem List Patient Active Problem List   Diagnosis Date Noted  . Acute pain of left shoulder 05/11/2020  . Pain due to onychomycosis of toenails of both feet 02/25/2020  . Rash and nonspecific skin eruption 11/26/2019  . Screening for malignant neoplasm of breast 08/20/2019  . Screening for osteoporosis 08/20/2019  . Fecal incontinence 01/23/2019  . NICM (nonischemic cardiomyopathy) ([place]) 08/22/2017  . CHF (congestive heart failure) ([place]) 04/03/2017  . Protein-calorie malnutrition, severe 11/02/2016  . Hypokalemia 04/28/2013  . Leukocytosis, unspecified 04/28/2013  . Uncontrolled type 2 diabetes mellitus with hyperglycemia ([place])   . Essential hypertension   . Hypercholesterolemia 11/10/2012    Everlean Alstrom. Graylon Good, PT, DPT 08/10/20, 10:14 AM  [facility] [address] [place], [address] Phone: [phone]   Fax:  [phone]  Name: Erin Hayes MRN: [id number] Date of Birth: 06-Feb-1966

## 2020-08-15 ENCOUNTER — Encounter: Payer: Medicaid Other | Admitting: Physical Therapy

## 2020-08-16 ENCOUNTER — Ambulatory Visit (INDEPENDENT_AMBULATORY_CARE_PROVIDER_SITE_OTHER): Payer: Medicaid Other

## 2020-08-16 ENCOUNTER — Ambulatory Visit (INDEPENDENT_AMBULATORY_CARE_PROVIDER_SITE_OTHER): Payer: Medicaid Other | Admitting: Podiatry

## 2020-08-16 ENCOUNTER — Other Ambulatory Visit: Payer: Self-pay

## 2020-08-16 ENCOUNTER — Encounter: Payer: Self-pay | Admitting: Podiatry

## 2020-08-16 DIAGNOSIS — S92302A Fracture of unspecified metatarsal bone(s), left foot, initial encounter for closed fracture: Secondary | ICD-10-CM

## 2020-08-16 NOTE — Progress Notes (Signed)
Subjective:  Patient ID: Erin Hayes, female    DOB: 27-Feb-1966,  MRN: [id number]  Chief Complaint  Patient presents with  . Fracture    "its doing better, but still sore and tender"    55 y.o. female presents with the above complaint.  Patient presents with a follow-up metatarsal head fracture minimally displaced of the left third and fourth.  Patient states she is doing a lot better she does not have much pain.  She still has hard time ambulating.  She denies any other acute complaints.   Review of Systems: Negative except as noted in the HPI. Denies N/V/F/Ch.  Past Medical History:  Diagnosis Date  . Carpal tunnel syndrome of right wrist   . Chronic combined systolic (congestive) and diastolic (congestive) heart failure ([place])    a. 03/2017 Echo: EF 20-25%, diff HK, Gr2 DD; b. 09/2017 Echo: EF 20-25%, diff HK, Gr2 DD.  . Diabetes ([place])   . GERD (gastroesophageal reflux disease)   . HLD (hyperlipidemia)   . Hypertension   . NICM (nonischemic cardiomyopathy) ([place])    a. 03/2017 Echo: EF 20-25%, diff HK, Gr2 DD, mild to mod MR, nl RV fxn;  b. 03/2017 Cath: mild nonobs dzs, EF 25%; c. 09/2017 Echo: EF 20-25%, diff HK. Gr2 DD. Mild MR. Mildly reduced RV fxn.  . Pleural effusion, left    a. 03/2017 s/p thoracentesis-->300 ml withdrawn--transudative.  . Pneumonia 02/19/2017    Current Outpatient Medications:  .  albuterol (VENTOLIN HFA) 108 (90 Base) MCG/ACT inhaler, Inhale 2 puffs into the lungs every 4 (four) hours as needed for wheezing or shortness of breath., Disp: 6.7 g, Rfl: 1 .  aspirin 81 MG tablet, Take 81 mg by mouth daily., Disp: , Rfl:  .  carvedilol (COREG) 12.5 MG tablet, Take 1.5 tablets (18.75 mg total) by mouth 2 (two) times daily., Disp: 270 tablet, Rfl: 3 .  chlorhexidine (PERIDEX) 0.12 % solution, 15 mLs 2 (two) times daily., Disp: , Rfl:  .  Exenatide ER (BYDUREON BCISE) 2 MG/0.85ML AUIJ, Inject 2 mg into the skin once a week., Disp: 4 pen, Rfl: 1 .  furosemide  (LASIX) 20 MG tablet, Take 1 tablet (20 mg total) by mouth daily., Disp: 90 tablet, Rfl: 3 .  glucose blood (CONTOUR TEST) test strip, by Other route Two (2) times a day., Disp: , Rfl:  .  HYDROcodone-acetaminophen (NORCO/VICODIN) 5-325 MG tablet, Take 1 tablet by mouth every 6 (six) hours as needed., Disp: , Rfl:  .  hydrocortisone 2.5 % lotion, Apply topically., Disp: , Rfl:  .  Hyoscyamine Sulfate SL 0.125 MG SUBL, Dissolve 1-2 tablets on your tongue every 4-6 hours as needed for cramping or diarrhea, Disp: , Rfl:  .  ibuprofen (ADVIL) 400 MG tablet, Take 400 mg by mouth 3 (three) times daily., Disp: , Rfl:  .  insulin glargine (LANTUS) 100 UNIT/ML Solostar Pen, Inject 14 Units into the skin at bedtime., Disp: 15 mL, Rfl: 3 .  Insulin Pen Needle 31G X 5 MM MISC, Use as directed with Lantus, Disp: 100 each, Rfl: 3 .  loperamide (IMODIUM) 1 MG/5ML solution, Take 1 mg by mouth daily., Disp: , Rfl:  .  meloxicam (MOBIC) 15 MG tablet, Take 1 tablet (15 mg total) by mouth daily., Disp: 90 tablet, Rfl: 1 .  metFORMIN (GLUCOPHAGE) 500 MG tablet, Take 1 tablet (500 mg total) by mouth 2 (two) times daily with a meal., Disp: 180 tablet, Rfl: 1 .  ondansetron (ZOFRAN) 4  MG tablet, Take by mouth., Disp: , Rfl:  .  rosuvastatin (CRESTOR) 20 MG tablet, Take 1 tablet (20 mg total) by mouth daily., Disp: 90 tablet, Rfl: 3 .  sacubitril-valsartan (ENTRESTO) 49-51 MG, Take 1 tablet by mouth 2 (two) times daily., Disp: 30 tablet, Rfl: 3 .  spironolactone (ALDACTONE) 25 MG tablet, Take 1 tablet (25 mg total) by mouth daily., Disp: 90 tablet, Rfl: 2  Social History   Tobacco Use  Smoking Status Never Smoker  Smokeless Tobacco Never Used    No Known Allergies Objective:  There were no vitals filed for this visit. There is no height or weight on file to calculate BMI. Constitutional Well developed. Well nourished.  Vascular Dorsalis pedis pulses palpable bilaterally. Posterior tibial pulses palpable  bilaterally. Capillary refill normal to all digits.  No cyanosis or clubbing noted. Pedal hair growth normal.  Neurologic Normal speech. Oriented to person, place, and time. Epicritic sensation to light touch grossly present bilaterally.  Dermatologic Nails well groomed and normal in appearance. No open wounds. No skin lesions.  Orthopedic:  No pain on palpation with range of motion of the second and third digit.  Now pain with range of motion/intra-articular pain of the third digit.  No pain of the hallux and fourth and fifth digit.  No pain on palpation/dorsal swelling noted to the forefoot of third metatarsal head as well as second metatarsal head.  No extensor tendinitis noted.   Radiographs: 3 views of skeletally mature adult left foot: No fractures noted.  Severe osteopenia noted.  The fracture may have consolidated.  Assessment:   1. Closed fracture of head of metatarsal, left, initial encounter    Plan:  Patient was evaluated and treated and all questions answered.  Left forefoot pain with history of chronic fracture of third and fourth metatarsal head -Radiographically it is not evident of a fracture.  Patient does have severe osteoporosis to the foot.  I discussed with her that she will always have some sort of a pain given the amount of osteoporosis that is present.  At this time I do not see any kind of stress fracture or any acute concern.  Her pain is improving clinically as well.  She has been ambulating with surgical shoe.  I discussed with her that she can start transition to regular shoe.  She also has a physical therapy that she works with I discussed with him that she can also do physical therapy to her foot.  She does not have any restrictions.  She states understanding. -I will see her back as needed if her pain returns. No follow-ups on file.

## 2020-08-17 ENCOUNTER — Other Ambulatory Visit: Payer: Self-pay | Admitting: Podiatry

## 2020-08-17 ENCOUNTER — Encounter: Payer: Self-pay | Admitting: Physical Therapy

## 2020-08-17 ENCOUNTER — Ambulatory Visit: Payer: Medicare Other | Admitting: Physical Therapy

## 2020-08-17 DIAGNOSIS — G8929 Other chronic pain: Secondary | ICD-10-CM

## 2020-08-17 DIAGNOSIS — R202 Paresthesia of skin: Secondary | ICD-10-CM

## 2020-08-17 DIAGNOSIS — M542 Cervicalgia: Secondary | ICD-10-CM

## 2020-08-17 DIAGNOSIS — S92302A Fracture of unspecified metatarsal bone(s), left foot, initial encounter for closed fracture: Secondary | ICD-10-CM

## 2020-08-17 DIAGNOSIS — M25512 Pain in left shoulder: Secondary | ICD-10-CM | POA: Diagnosis not present

## 2020-08-17 NOTE — Therapy (Signed)
[facility] [address] [place], [place], [address] Phone: [phone]   Fax:  [phone]  Physical Therapy Treatment / Discharge Summary Dates of reporting: 06/08/2020 - 08/17/2020  Patient Details  Name: Erin Hayes MRN: [id number] Date of Birth: 09/28/1965 Referring Provider (PT): Malfi, Nicole M, FNP   Encounter Date: 08/17/2020   PT End of Session - 08/17/20 1001    Visit Number 16    Number of Visits 24    Date for PT Re-Evaluation 08/31/20    Authorization Type [place] [employer] reporting period from 07/18/2020    Authorization Time Period 27 PT/OT/ST visit per calendar year    Authorization - Visit Number 16    Authorization - Number of Visits 27    Progress Note Due on Visit 20    PT Start Time 0945    PT Stop Time 1025    PT Time Calculation (min) 40 min    Activity Tolerance Patient tolerated treatment well;No increased pain    Behavior During Therapy WFL for tasks assessed/performed           Past Medical History:  Diagnosis Date  . Carpal tunnel syndrome of right wrist   . Chronic combined systolic (congestive) and diastolic (congestive) heart failure (HCC)    a. 03/2017 Echo: EF 20-25%, diff HK, Gr2 DD; b. 09/2017 Echo: EF 20-25%, diff HK, Gr2 DD.  . Diabetes (HCC)   . GERD (gastroesophageal reflux disease)   . HLD (hyperlipidemia)   . Hypertension   . NICM (nonischemic cardiomyopathy) (HCC)    a. 03/2017 Echo: EF 20-25%, diff HK, Gr2 DD, mild to mod MR, nl RV fxn;  b. 03/2017 Cath: mild nonobs dzs, EF 25%; c. 09/2017 Echo: EF 20-25%, diff HK. Gr2 DD. Mild MR. Mildly reduced RV fxn.  . Pleural effusion, left    a. 03/2017 s/p thoracentesis-->300 ml withdrawn--transudative.  . Pneumonia 02/19/2017    Past Surgical History:  Procedure Laterality Date  . COLONOSCOPY WITH PROPOFOL N/A 01/21/2017   Procedure: COLONOSCOPY WITH PROPOFOL;  Surgeon: Anna, Kiran, MD;  Location: [facility]  ENDOSCOPY;  Service: Gastroenterology;  Laterality: N/A;  . ESOPHAGOGASTRODUODENOSCOPY (EGD) WITH PROPOFOL N/A 01/21/2017   Procedure: ESOPHAGOGASTRODUODENOSCOPY (EGD) WITH PROPOFOL;  Surgeon: Anna, Kiran, MD;  Location: [facility] ENDOSCOPY;  Service: Gastroenterology;  Laterality: N/A;  . FLEXIBLE SIGMOIDOSCOPY N/A 11/04/2016   Procedure: FLEXIBLE SIGMOIDOSCOPY;  Surgeon: Schooler, Vincent, MD;  Location: [facility] ENDOSCOPY;  Service: Endoscopy;  Laterality: N/A;  . LEFT HEART CATH AND CORONARY ANGIOGRAPHY N/A 04/05/2017   Procedure: LEFT HEART CATH AND CORONARY ANGIOGRAPHY;  Surgeon: Arida, Muhammad A, MD;  Location: [facility] INVASIVE CV LAB;  Service: Cardiovascular;  Laterality: N/A;  . LITHOTRIPSY      There were no vitals filed for this visit.   Subjective Assessment - 08/17/20 0946    Subjective Patient reports she is doing well and she has no pain in her left shoulder upon arrival. Last time she had pain was about 30 min on monday after she moved it wrong. HEP is going well. States she saw her podiatrist and he would like her to start PT for her foot. She feels ready to switch to working on her foot as soon as we get the referral.    Pertinent History Patient is a 55 y.o. female who presents to outpatient physical therapy with a referral for medical diagnosis acute pain of left shoulder. This patient's chief complaints consist of left shoulder   pain, leading to the following functional deficits: difficulty with usual activities that require use of left UE including reaching for things with left hand, turning to the left, dressing, grooming, getting groceries,  cooking, cleaning, cross stitching, sleeping.  Relevant past medical history and comorbidities include carpal tunnel syndrome (maybe undiagnosed left), CHF, Diabetes, hypertension, nonischemic cardiomyopathy, hx of pleural effusion left lung (2018), hx left heart cath and coronary angiography (2018), L foot fracture (fall Oct 2020) wears shoe. Patient  denies hx of cancer, stroke, seizures, unexplained weight loss, changes in bowel or bladder problems, new onset stumbling or dropping things.    Limitations Lifting;House hold activities;Other (comment)    Currently in Pain? No/denies           OBJECTIVE  FOTO = 59 (08/17/2020);  PERIPHERAL JOINT MOTION (in degrees)  Active Range of Motion (AROM) *Indicates pain 06/08/20 07/20/20 08/17/20  Joint/Motion R/L R/L R/L  Shoulder     Flexion 180/125* /150 /163*  Extension 75/40* /40 /50  Abduction 180/106* /152* /150*  External rotation 90/62* /78* /70  Internal rotation T5/L1* /T10* /T9*  Comments: 06/08/2020: B elbows, wrists WFL and equal bilaterally. Paresthesia in L hand with shoulder ROM. 07/20/2020: L shoulder abduction produces pain at radial side of hand (lingering) and anterior shoulder pain (ERP). Continued pain after abduction. Pain at anterior shoulder (ERP) with ER. pain at anterior shoulder (ERP) with IR.  08/17/2020: mild pain that lingers  MUSCLE PERFORMANCE (MMT):  *Indicates pain 06/08/20 07/20/20 08/17/20  Joint/Motion R/L R/L R/L  Shoulder     Flexion 4+/4* 4+/4+ 5/5  Abduction 5/4* 5/5 5/5  External rotation 4+/4+* 4/4 4+/4  Internal rotation 4+/4+ 4+/4+ 5/5  Elbow     Flexion 4+/3+* 4+/4+ 4+/4+  Extension 4+/4* 4+/5 4+/4    TREATMENT: Denies sensitivity to latex  Therapeutic exercise:to centralize symptoms and improve ROM, strength, muscular endurance, and activity tolerance required for successful completion of functional activities. -Standing rows with scapular retraction for improved postural and shoulder girdle strengthening and mobility. Required instruction for technique and cuing to retract, posteriorly tilt, and depress scapulae.2x30 with10# cable. - standing left shoulder AAROMflexionand abductionon wall,1x20each direction.No pain. - standing B shoulder abduction to 90 degrees palms down with 3# DB, 3x10 (mild pian).  -  seated overhead press, 3x10 with 3# DB (no pain) -standingbicep curls2x15with5#DBeach side(challenging) -Seated LUE GHJ ER, elbow supported on table at 75 degrees shoudler, forearm perpendicular to gravity at start -2x15 with4#DB(challenging) - measurements to assess progress (see above).  - modified push up on counter, 3x10 - standing L shoulder ER with red theraband and towel roll bolster, reps until form correct.  - Education on HEP including handout   Pt required multimodal cuing for proper technique and to facilitate improved neuromuscular control, strength, range of motion, and functional ability resulting in improved performance and form.  HOME EXERCISE PROGRAM Access Code: [email or web address] URL: [email or web address] Date: 08/17/2020 Prepared by: Sara Snyder  Exercises Row with band/cable - 1 x daily - 1 sets - 30 reps - 1 second hold Supine Deep Neck Flexor Training - Repetitions - 1 x daily - 2 sets - 10 reps - 1-5 seconds hold Prone Cervical Retraction - 1 x daily - 2 sets - 10 reps - 1-5 seconds hold Shoulder Flexion Wall Slide with Towel - 1 x daily - 1 sets - 20 reps Standing Shoulder Abduction Slides at Wall - 1 x daily - 1 sets - 20 reps Standing Shoulder Abduction with Resistance - 3 x weekly -   3 sets - 10-15 reps CLX Shoulder Overhead Press with Hand Closed - 3 x weekly - 3 sets - 10-15 reps Shoulder External Rotation with Anchored Resistance - 3 x weekly - 3 sets - 10-15 reps Push Up on Table - 3 x weekly - 3 sets - 10-15 reps    PT Education - 08/17/20 1000    Education Details exercise form technique/form. self-management techniques.    Person(s) Educated Patient    Methods Explanation;Demonstration;Tactile cues;Verbal cues    Comprehension Verbalized understanding;Returned demonstration;Verbal cues required;Tactile cues required;Need further instruction            PT Short Term Goals - 07/04/20 1306      PT SHORT TERM GOAL #1   Title Be  independent with initial home exercise program for self-management of symptoms.    Baseline initial HEP provided at IE (06/08/20);    Time 3    Period Weeks    Status Achieved    Target Date 06/29/20             PT Long Term Goals - 08/17/20 1017      PT LONG TERM GOAL #1   Title Be independent with a long-term home exercise program for self-management of symptoms.    Baseline initial HEP provided at IE (06/08/20); currently participating well in appropriate HEP (07/20/2020);    Time 12    Period Weeks    Status Achieved   TARGET DATE FOR ALL LONG TERM GOALS: 08/31/2020     PT LONG TERM GOAL #2   Title Demonstrate improved FOTO score to 63 by visit #11 to demonstrate improvement in overall condition and self-reported functional ability.    Baseline 41 (06/28/2020); 51 at visit 10 (07/20/2020); 63 (08/10/2020); 59 (08/17/2020);    Time 12    Period Weeks    Status Partially Met      PT LONG TERM GOAL #3   Title Have full left shoulder AROM with no compensations or increase in pain in all planes except intermittent end range discomfort to allow patient to complete valued activities with less difficulty.    Baseline limited and painfull - see objective exam (06/08/20); improving - see objective exam (07/20/2020);    Time 12    Period Weeks    Status Partially Met      PT LONG TERM GOAL #4   Title Improve left shoulder strength to 4+/5 with no increase in pain for improved ability to allow patient to complete valued functional tasks such as reaching overhead and crafting with less difficulty.    Baseline limited and painfull - see objective exam (06/08/20); improving - see objective exam (07/20/2020);    Time 12    Period Weeks    Status Partially Met      PT LONG TERM GOAL #5   Title Complete community, work and/or recreational activities without limitation due to current condition.    Baseline Functional Limitations: difficulty with usual activities that require use of left UE including  reaching for things with left hand, turning to the left, dressing, grooming, getting groceries,  cooking, cleaning, cross stitching, sleeping (06/08/2020);  Reports she can do cooking a lot better now, cross stitching is better, dressing is better but reaching and putting shirt on is still difficulty. Turning to the left is fine now (07/20/2020); can do all activities listed without limitation from left shoulder (08/17/2020);    Time 12    Period Weeks    Status Achieved                   Plan - 08/17/20 1511    Clinical Impression Statement Patient has attended 16 physical therapy sessions and has demonstrated significant improvement in her symptoms and function. She has met or nearly met all goals and would like to discharge her PT case for L shoulder so she can start PT for her foot. Patient has been provided with long term HEP for long term self management. Patient is now discharged from PT due to improvement in condition and apparent readiness to graduate to more independent management.    Personal Factors and Comorbidities Comorbidity 2    Comorbidities Relevant past medical history and comorbidities include carpal tunnel syndrome (maybe undiagnosed left), CHF, Diabetes, hypertension, nonischemic cardiomyopathy, hx of pleural effusion left lung (2018), hx left heart cath and coronary angiography (2018), L foot fracture (fall Oct 2020) wears post op shoe. Patient denies hx of cancer, stroke, seizures, unexplained weight loss, changes in bowel or bladder problems, new onset stumbling or dropping things.    Examination-Activity Limitations Caring for Others    Examination-Participation Restrictions Cleaning;Laundry;Meal Prep    Stability/Clinical Decision Making Evolving/Moderate complexity    Rehab Potential Poor    PT Frequency 2x / week    PT Duration 12 weeks    PT Treatment/Interventions ADLs/Self Care Home Management;Aquatic Therapy;Cryotherapy;Moist Heat;Electrical Stimulation;Therapeutic  activities;Therapeutic exercise;Neuromuscular re-education;Patient/family education;Manual techniques;Passive range of motion;Dry needling;Spinal Manipulations;Joint Manipulations    PT Next Visit Plan Patient is now discharged from PT to independent management of condition    PT Home Exercise Plan medbridge  Access Code: [email or web address]    Consulted and Agree with Plan of Care Patient           Patient will benefit from skilled therapeutic intervention in order to improve the following deficits and impairments:  Improper body mechanics,Pain,Decreased coordination,Postural dysfunction,Increased muscle spasms,Decreased activity tolerance,Decreased endurance,Decreased range of motion,Decreased strength,Hypomobility,Impaired perceived functional ability,Impaired UE functional use  Visit Diagnosis: Chronic left shoulder pain  Cervicalgia  Paresthesia of skin     Problem List Patient Active Problem List   Diagnosis Date Noted  . Acute pain of left shoulder 05/11/2020  . Pain due to onychomycosis of toenails of both feet 02/25/2020  . Rash and nonspecific skin eruption 11/26/2019  . Screening for malignant neoplasm of breast 08/20/2019  . Screening for osteoporosis 08/20/2019  . Fecal incontinence 01/23/2019  . NICM (nonischemic cardiomyopathy) ([place]) 08/22/2017  . CHF (congestive heart failure) ([place]) 04/03/2017  . Protein-calorie malnutrition, severe 11/02/2016  . Hypokalemia 04/28/2013  . Leukocytosis, unspecified 04/28/2013  . Uncontrolled type 2 diabetes mellitus with hyperglycemia ([place])   . Essential hypertension   . Hypercholesterolemia 11/10/2012    Everlean Alstrom. Graylon Good, PT, DPT 08/17/20, 3:12 PM  [facility] [facility] [address] [place], [address] Phone: [phone]   Fax:  [phone]  Name: Erin Hayes MRN: [id number] Date of Birth: Oct 16, 1965

## 2020-08-18 ENCOUNTER — Ambulatory Visit: Payer: Medicaid Other | Admitting: Podiatry

## 2020-08-22 ENCOUNTER — Ambulatory Visit: Payer: Medicare Other | Admitting: Physical Therapy

## 2020-08-22 ENCOUNTER — Telehealth: Payer: Self-pay | Admitting: Physical Therapy

## 2020-08-22 NOTE — Telephone Encounter (Signed)
Called patient when she did not show up for her 9:45am appointment today. Patient answered and stated she overslept. Offered to reschedule her but she preferred to just wait until next appointment scheduled wed 08/24/2020 at 9:45pm.   Everlean Alstrom. Graylon Good, PT, DPT 08/22/20, 10:24 AM

## 2020-08-24 ENCOUNTER — Encounter: Payer: Self-pay | Admitting: Physical Therapy

## 2020-08-24 ENCOUNTER — Other Ambulatory Visit: Payer: Self-pay

## 2020-08-24 ENCOUNTER — Ambulatory Visit: Payer: Medicare Other | Admitting: Physical Therapy

## 2020-08-24 DIAGNOSIS — R262 Difficulty in walking, not elsewhere classified: Secondary | ICD-10-CM

## 2020-08-24 DIAGNOSIS — M6281 Muscle weakness (generalized): Secondary | ICD-10-CM

## 2020-08-24 DIAGNOSIS — M25512 Pain in left shoulder: Secondary | ICD-10-CM | POA: Diagnosis not present

## 2020-08-24 DIAGNOSIS — M25675 Stiffness of left foot, not elsewhere classified: Secondary | ICD-10-CM

## 2020-08-24 DIAGNOSIS — M79672 Pain in left foot: Secondary | ICD-10-CM

## 2020-08-24 NOTE — Therapy (Signed)
[facility] [address] [place], [address] Phone: [phone]   Fax:  [phone]  Physical Therapy Evaluation  Patient Details  Name: Erin Hayes MRN: [id number] Date of Birth: Jun 19, 1965 Referring Provider (PT): Felipa Furnace, [place]   Encounter Date: 08/24/2020   PT End of Session - 08/24/20 0957    Visit Number 1    Number of Visits 24    Date for PT Re-Evaluation 11/16/20    Authorization Type [place] reporting period from 08/24/2020    Authorization Time Period 27 PT/OT/ST visit per calendar year    Authorization - Visit Number 17    Authorization - Number of Visits 27    Progress Note Due on Visit 10    PT Start Time 0945    PT Stop Time 1025    PT Time Calculation (min) 40 min    Activity Tolerance Patient tolerated treatment well    Behavior During Therapy [facility] for tasks assessed/performed           Past Medical History:  Diagnosis Date  . Carpal tunnel syndrome of right wrist   . Chronic combined systolic (congestive) and diastolic (congestive) heart failure ([place])    a. 03/2017 Echo: EF 20-25%, diff HK, Gr2 DD; b. 09/2017 Echo: EF 20-25%, diff HK, Gr2 DD.  . Diabetes ([place])   . GERD (gastroesophageal reflux disease)   . HLD (hyperlipidemia)   . Hypertension   . NICM (nonischemic cardiomyopathy) ([place])    a. 03/2017 Echo: EF 20-25%, diff HK, Gr2 DD, mild to mod MR, nl RV fxn;  b. 03/2017 Cath: mild nonobs dzs, EF 25%; c. 09/2017 Echo: EF 20-25%, diff HK. Gr2 DD. Mild MR. Mildly reduced RV fxn.  . Pleural effusion, left    a. 03/2017 s/p thoracentesis-->300 ml withdrawn--transudative.  . Pneumonia 02/19/2017    Past Surgical History:  Procedure Laterality Date  . COLONOSCOPY WITH PROPOFOL N/A 01/21/2017   Procedure: COLONOSCOPY WITH PROPOFOL;  Surgeon: Jonathon Bellows, MD;  Location: [facility] ENDOSCOPY;  Service: Gastroenterology;  Laterality: N/A;  .  ESOPHAGOGASTRODUODENOSCOPY (EGD) WITH PROPOFOL N/A 01/21/2017   Procedure: ESOPHAGOGASTRODUODENOSCOPY (EGD) WITH PROPOFOL;  Surgeon: Jonathon Bellows, MD;  Location: [facility] ENDOSCOPY;  Service: Gastroenterology;  Laterality: N/A;  . FLEXIBLE SIGMOIDOSCOPY N/A 11/04/2016   Procedure: FLEXIBLE SIGMOIDOSCOPY;  Surgeon: Wilford Corner, MD;  Location: [facility] ENDOSCOPY;  Service: Endoscopy;  Laterality: N/A;  . LEFT HEART CATH AND CORONARY ANGIOGRAPHY N/A 04/05/2017   Procedure: LEFT HEART CATH AND CORONARY ANGIOGRAPHY;  Surgeon: Wellington Hampshire, MD;  Location: [place] [facility] CV LAB;  Service: Cardiovascular;  Laterality: N/A;  . LITHOTRIPSY      There were no vitals filed for this visit.    Subjective Assessment - 08/24/20 0959    Subjective Patient states condition started when she fractured it a long time ago (~ October 2020) when she fell. She tripped. Her doctor said it has healed but her bones are weak.  Per his notes she can start transition to regular shoe and she does not have any restrictions. Notes she has severe osteoporosis in that foot and he expects her to have pain forever. She has been wearing a post-op shoe for several months. She attempted to go back to a regular shoe at one point but it swelled up and she was put back in the post-op shoe. She originally went to the ER and they didn't see a problem with it. She went  home and it kept swelling up so she went to the doctor who referred her to the foot doctor who said it was fractured. She has been in and out of the post-op shoe and spent some time in a soft cast for a while. She has now been cleared to start wearing a regular shoe again but she has not tried it. Uses w/c for long standing tasks, uses riding w/c for grocery shopping. Ambulates short community distances and short household distance without a cane or walker. Does have a RW. Does go the the bathroom at night without surgical shoe on.    Pertinent History Patient is a 55 y.o. female who  presents to outpatient physical therapy with a referral for medical diagnosis closed fracture of head of metatarsal, left. This patient's chief complaints consist of left foot pain, leading to the following functional deficits:  difficulty with walking, using stairs, getting up and down stairs, prolonged standing (sits in a w/c to do a lot of tasks now), completing tasks without fear of falling. Relevant past medical history and comorbidities include carpal tunnel syndrome (maybe undiagnosed left), CHF, Diabetes, hypertension, osteoporosis, nonischemic cardiomyopathy, hx of pleural effusion left lung (2018), hx left heart cath and coronary angiography (2018), L foot fracture (fall Oct 2020), left shoulder pain. Patient denies hx of cancer, stroke, seizures, unexplained weight loss, changes in bowel or bladder problems, new onset stumbling or dropping things.    Limitations House hold activities;Lifting;Standing;Walking   Functional Limitations: difficulty with walking, using stairs, getting up and down stairs, prolonged standing (sits in a w/c to do a lot of tasks now), completing tasks without fear of falling.   Diagnostic tests L foot Radiograph report 08/16/2020: "3 views of skeletally mature adult left foot: No fractures noted.  Severe osteopenia noted.  The fracture may have consolidated."    Patient Stated Goals "to be able to walk longer without having to use wheelchair"    Currently in Pain? Yes    Pain Score 8    W: 10/10 (walking); B: 2/10 (propping up with heat)   Pain Location Foot    Pain Orientation Left   left dorsal surface of lateral foot and toes   Pain Descriptors / Indicators Aching;Shooting    Pain Type Chronic pain    Pain Radiating Towards stays in foot. Reports numbness/tingling all over foot but also has neuropathy from diabetes. Has more paresthesia on left than right.    Pain Onset More than a month ago    Pain Frequency Constant    Aggravating Factors  walking, weight bearing,  moving it a certain way (inversion/eversion).    Pain Relieving Factors elevate, hot pack, resting, muscle rub    Effect of Pain on Daily Activities Functional Limitations: difficulty with walking, using stairs, getting up and down stairs, prolonged standing (sits in a w/c to do a lot of tasks now), completing tasks without fear of falling.               [facility] PT Assessment - 08/24/20 0952      Assessment   Medical Diagnosis Closed fracture of head of metatarsal, left,    Referring Provider (PT) Felipa Furnace, DPM    Onset Date/Surgical Date 02/05/19    Hand Dominance Right    Next MD Visit none scheduled    Prior Therapy None for this condition prior to current episode of care      Precautions   Precautions None  Restrictions   Weight Bearing Restrictions No      Balance Screen   Has the patient fallen in the past 6 months Yes    How many times? 1   asleep and fell off bed   Has the patient had a decrease in activity level because of a fear of falling?  Yes    Is the patient reluctant to leave their home because of a fear of falling?  No      Home Environment   Living Environment --   no concerns about getting around home     Prior Function   Level of Independence Independent   no limitations in ambulation and no need for AD   Vocation On disability;Retired    Nutritional therapist, crafts      Cognition   Overall Cognitive Status Within Functional Limits for tasks assessed      Observation/Other Assessments   Focus on Therapeutic Outcomes (FOTO)  43            OBJECTIVE  OBSERVATION/INSPECTION . Posture: B ankle/feet with increased pronation in standing, L > R . Toenails on bilateral feet appear thickened.  . Tremor: none . Edema: mild edema in left dorsal forefoot compared to left.  . Muscle bulk: generally overall mildly decreased in B LE consistent with lack of regular adequate exercise or physical activity.  . Skin: Scabs and wounds noted over B  lower legs and feet. Patient reports she does not know what from but are chronic. No evidence of infection.    . Bed mobility: sit <> long sitting I . Transfers: sit <> stand I with avoidance of L LE compared to R . Gait: ambulates with  antalgic gait favoring R LE with decreased weight bearing on this side, more lurching and with excessive L toe out when wearing surgical shoe but decreased disruption with foot unshod. Mildly stooped posture. Lacks left toe extension, ankle/foot push off.   PERIPHERAL JOINT MOTION (in degrees)  Active Range of Motion (AROM) *Indicates pain 08/24/20 Date Date  Joint/Motion R/L R/L R/L  Ankle/Foot     Dorsiflexion (knee ext) 10/8 / /   Passive Range of Motion (PROM) *Indicates pain 08/24/20 Date Date  Joint/Motion R/L R/L R/L  Ankle/Foot     Dorsiflexion (knee ext) 20/18* / /  Dorsiflexion (knee flex) / / /  Plantarflexion 50/38 / /  [place] 20/20* / /  Inversion 40/30 / /  Great toe extension 80/30* / /  Lesser toe extension ~75/30* / /  Comments: B hips and knees appear WFL for basic mobility  MUSCLE PERFORMANCE (MMT):  *Indicates pain 08/24/20 Date Date  Joint/Motion R/L R/L R/L  Hip     Flexion 4/4- / /  Knee     Extension 5/5 / /  Flexion 4/3+* / /  Ankle/Foot     Dorsiflexion  5/4+* / /  Great toe extension 4+/3* / /  Eversion 5/4+ / /  Plantarflexion 4+/4* / /  Inversion 4+/4 / /  Pronation 4+/4* / /  Great toe flexion 4+/4 / /   ACCESSORY MOTION:  - Painful AP intermetatarsal movement in left foot  PALPATION: - TTP over left metatarsal heads, 3-5  EDUCATION/COGNITION: Patient is alert and oriented X 4.  Objective measurements completed on examination: See above findings.     TREATMENT:   Therapeutic exercise: to centralize symptoms and improve ROM, strength, muscular endurance, and activity tolerance required for successful completion of functional activities.  -  seated B heel raises, 1x10 - seated L toe spreading, 2  second hold, 1x10 - Toe Yoga: great toe extension with small toes flexion pressure into floor, repeated 2 sec holds; small toes extension with great toe flexion pressure into floor, repeated 2 sec holds. Ball of foot and heel maintains contact with floor. To improve intrinsic foot muscle activation and strength in order to better support arch and intrinsic foot structures. 1x10 each side to learn exercise.  - Education on HEP including handout  - Education on diagnosis, prognosis, POC, anatomy and physiology of current condition.   Pt required multimodal cuing for proper technique and to facilitate improved neuromuscular control, strength, range of motion, and functional ability resulting in improved performance and form.  HOME EXERCISE PROGRAM Access Code: [email or web address] URL: [email or web address] Date: 08/24/2020 Prepared by: [name]  Exercises Toe Spreading - 1-3 x daily - 1 sets - 20 reps - 2 seconds hold Seated Great Toe Extension - 1-3 x daily - 1 sets - 20 reps - 2 seconds hold Seated Lesser Toes Extension - 1-3 x daily - 1 sets - 20 reps - 2 seconds hold Seated Heel Raise - 1-3 x daily - 2 sets - 20 reps     PT Education - 08/24/20 1004    Education Details Exercise purpose/form. Self management techniques. Education on diagnosis, prognosis, POC, anatomy and physiology of current condition Education on HEP including handout    Person(s) Educated Patient    Methods Explanation;Demonstration;Tactile cues;Verbal cues;Handout    Comprehension Verbalized understanding;Returned demonstration;Verbal cues required;Tactile cues required;Need further instruction            PT Short Term Goals - 08/24/20 2033      PT SHORT TERM GOAL #1   Title Be independent with initial home exercise program for self-management of symptoms.    Baseline initial HEP provided at IE (08/24/2020);    Time 2    Period Weeks    Status New    Target Date 09/07/20             PT Long  Term Goals - 08/24/20 2033      PT LONG TERM GOAL #1   Title Be independent with a long-term home exercise program for self-management of symptoms.    Baseline initial HEP provided at IE (08/24/2020);    Time 12    Period Weeks    Status New   TARGET DATE FOR ALL LONG TERM GOALS: 11/16/2020     PT LONG TERM GOAL #2   Title Demonstrate improved FOTO score to 59 by visit #13 to demonstrate improvement in overall condition and self-reported functional ability.    Baseline 43 (08/24/2020);    Time 12    Period Weeks    Status New      PT LONG TERM GOAL #3   Title Patient will ambulate equal or greater than 600 feet during 6 minute walk test without post-op shoe or assistive device to improve patient's household and community mobility.    Baseline ambulated only ~ 20 feet without post-op shoe (08/24/2020);    Time 12    Period Weeks    Status New      PT LONG TERM GOAL #4   Title Improve left ankle strength to 4+/5 with no increase in pain for improved ability to allow patient to complete valued functional tasks such as reaching walking and community activities.    Baseline limited and painfull - see objective exam (08/24/2020);  Time 12    Period Weeks    Status New      PT LONG TERM GOAL #5   Title Complete community, work and/or recreational activities without limitation due to current condition.    Baseline Functional Limitations: difficulty with walking, using stairs, getting up and down stairs, prolonged standing (sits in a w/c to do a lot of tasks now), completing tasks without fear of falling (08/24/2020);    Time 12    Period Weeks    Status New      Additional Long Term Goals   Additional Long Term Goals Yes      PT LONG TERM GOAL #6   Title Patient will demonstrate L ankle/toe A/PROM equal or greater than R ankle/toe A/PROM to improve walking mechanics to improve household and community ambulation.    Baseline limited and painful - see objective exam (08/24/2020);    Time  12    Period Weeks    Status New                  Plan - 08/24/20 2029    Clinical Impression Statement Patient is a 55 y.o. female referred to outpatient physical therapy with a medical diagnosis of closed fracture of head of metatarsal, left, who presents with signs and symptoms consistent with chronic pain of the left foot associated with fracture of left 4th metatarsal head with prolonged recovery. Patient presents with significant pain, ROM, joint stiffness, balance, motor control, edema, muscle performance (strength/power/endurance), and activity tolerance impairments that are limiting ability to complete her usual activities including community and household mobility, walking, using stairs, getting up and down stairs, prolonged standing (sits in a w/c to do a lot of tasks now), completing tasks without fear of falling without difficulty. Patient will benefit from skilled physical therapy intervention to address current body structure impairments and activity limitations to improve function and work towards goals set in current POC in order to return to prior level of function or maximal functional improvement.    Personal Factors and Comorbidities Comorbidity 3+;Past/Current Experience;Fitness;Time since onset of injury/illness/exacerbation    Comorbidities carpal tunnel syndrome (maybe undiagnosed left), CHF, Diabetes, hypertension, osteoporosis, nonischemic cardiomyopathy, hx of pleural effusion left lung (2018), hx left heart cath and coronary angiography (2018), L foot fracture (fall Oct 2020), left shoulder pain.    Examination-Activity Limitations Caring for Others;Lift;Stand;Locomotion Level;Carry;Squat;Stairs    Examination-Participation Restrictions Cleaning;Laundry;Meal Prep;Interpersonal Relationship;Community Activity;Yard Work;Shop    Stability/Clinical Decision Making Evolving/Moderate complexity    Clinical Decision Making Moderate    Rehab Potential Good    PT Frequency  2x / week    PT Duration 12 weeks    PT Treatment/Interventions ADLs/Self Care Home Management;Aquatic Therapy;Biofeedback;Cryotherapy;Electrical Stimulation;Moist Heat;DME Instruction;Gait training;Stair training;Functional mobility training;Therapeutic activities;Therapeutic exercise;Balance training;Neuromuscular re-education;Patient/family education;Orthotic Fit/Training;Manual techniques;Energy conservation;Spinal Manipulations;Joint Manipulations;Taping    PT Next Visit Plan ankle/foot strengthening as tolerated, wean from orthopedic shoe as tolerated. Balance training    PT Home Exercise Plan medbridge  Access Code: [email or web address]    Consulted and Agree with Plan of Care Patient           Patient will benefit from skilled therapeutic intervention in order to improve the following deficits and impairments:  Improper body mechanics,Pain,Decreased coordination,Decreased activity tolerance,Decreased endurance,Decreased range of motion,Decreased strength,Hypomobility,Impaired perceived functional ability,Abnormal gait,Increased fascial restricitons,Impaired sensation,Decreased mobility,Postural dysfunction,Decreased balance,Difficulty walking,Impaired flexibility,Increased edema  Visit Diagnosis: Pain in left foot  Stiffness of left foot, not elsewhere classified  Muscle weakness (generalized)  Difficulty in walking, not  elsewhere classified     Problem List Patient Active Problem List   Diagnosis Date Noted  . Acute pain of left shoulder 05/11/2020  . Pain due to onychomycosis of toenails of both feet 02/25/2020  . Rash and nonspecific skin eruption 11/26/2019  . Screening for malignant neoplasm of breast 08/20/2019  . Screening for osteoporosis 08/20/2019  . Fecal incontinence 01/23/2019  . NICM (nonischemic cardiomyopathy) ([place]) 08/22/2017  . CHF (congestive heart failure) ([place]) 04/03/2017  . Protein-calorie malnutrition, severe 11/02/2016  . Hypokalemia 04/28/2013  .  Leukocytosis, unspecified 04/28/2013  . Uncontrolled type 2 diabetes mellitus with hyperglycemia ([place])   . Essential hypertension   . Hypercholesterolemia 11/10/2012    Everlean Alstrom. Graylon Good, PT, DPT 08/24/20, 8:39 PM  [facility] [address] [place], [address] Phone: [phone]   Fax:  [phone]  Name: Erin Hayes MRN: [id number] Date of Birth: Aug 30, 1965

## 2020-08-29 ENCOUNTER — Encounter: Payer: Self-pay | Admitting: Physical Therapy

## 2020-08-29 ENCOUNTER — Other Ambulatory Visit: Payer: Self-pay

## 2020-08-29 ENCOUNTER — Ambulatory Visit: Payer: Medicare Other | Admitting: Physical Therapy

## 2020-08-29 DIAGNOSIS — M79672 Pain in left foot: Secondary | ICD-10-CM

## 2020-08-29 DIAGNOSIS — R262 Difficulty in walking, not elsewhere classified: Secondary | ICD-10-CM

## 2020-08-29 DIAGNOSIS — M25512 Pain in left shoulder: Secondary | ICD-10-CM | POA: Diagnosis not present

## 2020-08-29 DIAGNOSIS — M25675 Stiffness of left foot, not elsewhere classified: Secondary | ICD-10-CM

## 2020-08-29 DIAGNOSIS — M6281 Muscle weakness (generalized): Secondary | ICD-10-CM

## 2020-08-29 NOTE — Therapy (Signed)
[facility] [address], [place], [address] Phone: [phone]   Fax:  [phone]  Physical Therapy Treatment  Patient Details  Name: Erin Hayes MRN: [id number] Date of Birth: 05/17/1965 Referring Provider (PT): Felipa Furnace, [place]   Encounter Date: 08/29/2020   PT End of Session - 08/29/20 0951    Visit Number 2    Number of Visits 24    Date for PT Re-Evaluation 11/16/20    Authorization Type [place] reporting period from 08/24/2020    Authorization Time Period 27 PT/OT/ST visit per calendar year    Authorization - Visit Number 18    Authorization - Number of Visits 27    Progress Note Due on Visit 10    PT Start Time 0945    PT Stop Time 1025    PT Time Calculation (min) 40 min    Activity Tolerance Patient tolerated treatment well    Behavior During Therapy [facility] for tasks assessed/performed           Past Medical History:  Diagnosis Date  . Carpal tunnel syndrome of right wrist   . Chronic combined systolic (congestive) and diastolic (congestive) heart failure ([place])    a. 03/2017 Echo: EF 20-25%, diff HK, Gr2 DD; b. 09/2017 Echo: EF 20-25%, diff HK, Gr2 DD.  . Diabetes ([place])   . GERD (gastroesophageal reflux disease)   . HLD (hyperlipidemia)   . Hypertension   . NICM (nonischemic cardiomyopathy) ([place])    a. 03/2017 Echo: EF 20-25%, diff HK, Gr2 DD, mild to mod MR, nl RV fxn;  b. 03/2017 Cath: mild nonobs dzs, EF 25%; c. 09/2017 Echo: EF 20-25%, diff HK. Gr2 DD. Mild MR. Mildly reduced RV fxn.  . Pleural effusion, left    a. 03/2017 s/p thoracentesis-->300 ml withdrawn--transudative.  . Pneumonia 02/19/2017    Past Surgical History:  Procedure Laterality Date  . COLONOSCOPY WITH PROPOFOL N/A 01/21/2017   Procedure: COLONOSCOPY WITH PROPOFOL;  Surgeon: Jonathon Bellows, MD;  Location: [facility] ENDOSCOPY;  Service: Gastroenterology;  Laterality: N/A;  .  ESOPHAGOGASTRODUODENOSCOPY (EGD) WITH PROPOFOL N/A 01/21/2017   Procedure: ESOPHAGOGASTRODUODENOSCOPY (EGD) WITH PROPOFOL;  Surgeon: Jonathon Bellows, MD;  Location: [facility] ENDOSCOPY;  Service: Gastroenterology;  Laterality: N/A;  . FLEXIBLE SIGMOIDOSCOPY N/A 11/04/2016   Procedure: FLEXIBLE SIGMOIDOSCOPY;  Surgeon: Wilford Corner, MD;  Location: [facility] ENDOSCOPY;  Service: Endoscopy;  Laterality: N/A;  . LEFT HEART CATH AND CORONARY ANGIOGRAPHY N/A 04/05/2017   Procedure: LEFT HEART CATH AND CORONARY ANGIOGRAPHY;  Surgeon: Wellington Hampshire, MD;  Location: [facility];  Service: Cardiovascular;  Laterality: N/A;  . LITHOTRIPSY      There were no vitals filed for this visit.   Subjective Assessment - 08/29/20 0948    Subjective Pateint reports her left foot remains sore, 5/10, in the familiar region on the dorsal lateral aspect of the left foot near 4th metatarsal head. States she has been doing her HEP and the toe exercises have been difficult because her toes won't do what she wants.    Pertinent History Patient is a 55 y.o. female who presents to outpatient physical therapy with a referral for medical diagnosis closed fracture of head of metatarsal, left. This patient's chief complaints consist of left foot pain, leading to the following functional deficits:  difficulty with walking, using stairs, getting up and down stairs, prolonged standing (sits in a w/c to do a lot of tasks now), completing  tasks without fear of falling. Relevant past medical history and comorbidities include carpal tunnel syndrome (maybe undiagnosed left), CHF, Diabetes, hypertension, osteoporosis, nonischemic cardiomyopathy, hx of pleural effusion left lung (2018), hx left heart cath and coronary angiography (2018), L foot fracture (fall Oct 2020), left shoulder pain. Patient denies hx of cancer, stroke, seizures, unexplained weight loss, changes in bowel or bladder problems, new onset stumbling or dropping things.     Limitations House hold activities;Lifting;Standing;Walking   Functional Limitations: difficulty with walking, using stairs, getting up and down stairs, prolonged standing (sits in a w/c to do a lot of tasks now), completing tasks without fear of falling.   Diagnostic tests L foot Radiograph report 08/16/2020: "3 views of skeletally mature adult left foot: No fractures noted.  Severe osteopenia noted.  The fracture may have consolidated."    Patient Stated Goals "to be able to walk longer without having to use wheelchair"    Currently in Pain? Yes    Pain Score 5     Pain Onset More than a month ago             TREATMENT:   Therapeutic exercise:to centralize symptoms and improve ROM, strength, muscular endurance, and activity tolerance required for successful completion of functional activities.  - seated B heel raises, 2x20 - seated L toe spreading, 2 second hold, 1x20 - Toe Yoga: great toe extension with small toes flexion pressure into floor, repeated 2 sec holds; small toes extension with great toe flexion pressure into floor, repeated 2 sec holds. Ball of foot and heel maintains contact with floor. To improve intrinsic foot muscle activation and strength in order to better support arch and intrinsic foot structures. 1x20 each side to learn exercise.  - seated L ankle inversion in figure 4 position against yellow theraband, 1x20 - seated L ankle eversion with feet on floor against yellow theraband, 1x20 - Seated left ankle BAPS board, level 2, plantarflexion/dorsiflexion, inversion/eversion, circles clockwise, circles counter clockwise. Pt required cuing to keep knee still and instruction on how to perform exercise. 1x20 reps each direction.  - single leg stance with as little UE support as possible, 3x30 seconds each side - walking with socks only, focusing on improving gait form: 1x100 feet, 1x50 feet. (pain at toe off, markedly increased L ankle pronation vs R).  - standing B arch lifts  in parallel position with B UE support, 1x20, 5 second holds.  - Education on HEP including handout  - education on POC  Pt required multimodal cuing for proper technique and to facilitate improved neuromuscular control, strength, range of motion, and functional ability resulting in improved performance and form.  HOME EXERCISE PROGRAM Access Code: [email or web address] URL: [email or web address] Date: 08/29/2020 Prepared by: [name]  Exercises Toe Spreading - 1-3 x daily - 1 sets - 20 reps - 2 seconds hold Seated Great Toe Extension - 1-3 x daily - 1 sets - 20 reps - 2 seconds hold Seated Lesser Toes Extension - 1-3 x daily - 1 sets - 20 reps - 2 seconds hold Seated Heel Raise - 1-3 x daily - 2 sets - 20 reps Seated Toe Raise - 1 x daily - 2 sets - 20 reps - 2 seconds hold Seated Figure 4 Ankle Inversion with Resistance - 1 x daily - 2 sets - 20 reps - 2 seconds hold Seated Ankle Eversion with Resistance - 1 x daily - 2 sets - 20 reps - 2 seconds hold Standing Single Leg Stance with Unilateral  Counter Support - 1 x daily - 3 reps - 30 seconds hold Arch Lifting - 1 x daily - 1 sets - 20 reps - 5 seconds hold    PT Education - 08/29/20 0951    Education Details Exercise purpose/form. Self management techniques    Person(s) Educated Patient    Methods Explanation;Demonstration;Tactile cues;Verbal cues    Comprehension Verbalized understanding;Returned demonstration;Verbal cues required;Tactile cues required;Need further instruction            PT Short Term Goals - 08/24/20 2033      PT SHORT TERM GOAL #1   Title Be independent with initial home exercise program for self-management of symptoms.    Baseline initial HEP provided at IE (08/24/2020);    Time 2    Period Weeks    Status New    Target Date 09/07/20             PT Long Term Goals - 08/24/20 2033      PT LONG TERM GOAL #1   Title Be independent with a long-term home exercise program for self-management  of symptoms.    Baseline initial HEP provided at IE (08/24/2020);    Time 12    Period Weeks    Status New   TARGET DATE FOR ALL LONG TERM GOALS: 11/16/2020     PT LONG TERM GOAL #2   Title Demonstrate improved FOTO score to 59 by visit #13 to demonstrate improvement in overall condition and self-reported functional ability.    Baseline 43 (08/24/2020);    Time 12    Period Weeks    Status New      PT LONG TERM GOAL #3   Title Patient will ambulate equal or greater than 600 feet during 6 minute walk test without post-op shoe or assistive device to improve patient's household and community mobility.    Baseline ambulated only ~ 20 feet without post-op shoe (08/24/2020);    Time 12    Period Weeks    Status New      PT LONG TERM GOAL #4   Title Improve left ankle strength to 4+/5 with no increase in pain for improved ability to allow patient to complete valued functional tasks such as reaching walking and community activities.    Baseline limited and painfull - see objective exam (08/24/2020);    Time 12    Period Weeks    Status New      PT LONG TERM GOAL #5   Title Complete community, work and/or recreational activities without limitation due to current condition.    Baseline Functional Limitations: difficulty with walking, using stairs, getting up and down stairs, prolonged standing (sits in a w/c to do a lot of tasks now), completing tasks without fear of falling (08/24/2020);    Time 12    Period Weeks    Status New      Additional Long Term Goals   Additional Long Term Goals Yes      PT LONG TERM GOAL #6   Title Patient will demonstrate L ankle/toe A/PROM equal or greater than R ankle/toe A/PROM to improve walking mechanics to improve household and community ambulation.    Baseline limited and painful - see objective exam (08/24/2020);    Time 12    Period Weeks    Status New                 Plan - 08/29/20 1056    Clinical Impression Statement Patient tolerated  treatment  well overall and reported mild decrease in pain by end of session. Continues to have limitations in lesser toe extension and excessive pronation L > R. Progressed HEP to include more vigorous and comprehensive foot/ankle strengthening and discussed weaning from shoe. Patient elected to come at 1x/wk frequency due to limited visits covered by insurance. HEP made more robust to help patient make progress with less in-clinic sessions. Patient would benefit from continued management of limiting condition by skilled physical therapist to address remaining impairments and functional limitations to work towards stated goals and return to PLOF or maximal functional independence.    Personal Factors and Comorbidities Comorbidity 3+;Past/Current Experience;Fitness;Time since onset of injury/illness/exacerbation    Comorbidities carpal tunnel syndrome (maybe undiagnosed left), CHF, Diabetes, hypertension, osteoporosis, nonischemic cardiomyopathy, hx of pleural effusion left lung (2018), hx left heart cath and coronary angiography (2018), L foot fracture (fall Oct 2020), left shoulder pain.    Examination-Activity Limitations Caring for Others;Lift;Stand;Locomotion Level;Carry;Squat;Stairs    Examination-Participation Restrictions Cleaning;Laundry;Meal Prep;Interpersonal Relationship;Community Activity;Yard Work;Shop    Stability/Clinical Decision Making Evolving/Moderate complexity    Rehab Potential Good    PT Frequency 2x / week    PT Duration 12 weeks    PT Treatment/Interventions ADLs/Self Care Home Management;Aquatic Therapy;Biofeedback;Cryotherapy;Electrical Stimulation;Moist Heat;DME Instruction;Gait training;Stair training;Functional mobility training;Therapeutic activities;Therapeutic exercise;Balance training;Neuromuscular re-education;Patient/family education;Orthotic Fit/Training;Manual techniques;Energy conservation;Spinal Manipulations;Joint Manipulations;Taping    PT Next Visit Plan  ankle/foot strengthening as tolerated, wean from orthopedic shoe as tolerated. Balance training    PT Home Exercise Plan medbridge  Access Code: [email or web address]    Consulted and Agree with Plan of Care Patient           Patient will benefit from skilled therapeutic intervention in order to improve the following deficits and impairments:  Improper body mechanics,Pain,Decreased coordination,Decreased activity tolerance,Decreased endurance,Decreased range of motion,Decreased strength,Hypomobility,Impaired perceived functional ability,Abnormal gait,Increased fascial restricitons,Impaired sensation,Decreased mobility,Postural dysfunction,Decreased balance,Difficulty walking,Impaired flexibility,Increased edema  Visit Diagnosis: Pain in left foot  Stiffness of left foot, not elsewhere classified  Muscle weakness (generalized)  Difficulty in walking, not elsewhere classified     Problem List Patient Active Problem List   Diagnosis Date Noted  . Acute pain of left shoulder 05/11/2020  . Pain due to onychomycosis of toenails of both feet 02/25/2020  . Rash and nonspecific skin eruption 11/26/2019  . Screening for malignant neoplasm of breast 08/20/2019  . Screening for osteoporosis 08/20/2019  . Fecal incontinence 01/23/2019  . NICM (nonischemic cardiomyopathy) ([place]) 08/22/2017  . CHF (congestive heart failure) ([place]) 04/03/2017  . Protein-calorie malnutrition, severe 11/02/2016  . Hypokalemia 04/28/2013  . Leukocytosis, unspecified 04/28/2013  . Uncontrolled type 2 diabetes mellitus with hyperglycemia ([place])   . Essential hypertension   . Hypercholesterolemia 11/10/2012    Everlean Alstrom. Graylon Good, PT, DPT 08/29/20, 10:57 AM  [facility] [address] [place], [address] Phone: [phone]   Fax:  [phone]  Name: Erin Hayes MRN: [id number] Date of Birth: 01/15/1966

## 2020-08-31 ENCOUNTER — Ambulatory Visit: Payer: Medicare Other | Admitting: Physical Therapy

## 2020-09-02 ENCOUNTER — Ambulatory Visit: Payer: Medicare Other | Admitting: Internal Medicine

## 2020-09-02 NOTE — Progress Notes (Deleted)
Subjective:    Patient ID: Erin Hayes, female    DOB: 02-10-1966, 55 y.o.   MRN: [id number]  HPI  Pt presents to the clinic today for 6 month follow up of chronic conditions. She is establishing care with me today, transferring care from Cyndia Skeeters, NP.  HTN with NICM: Her BP today is. She is taking Sacubitirl-Valsartan, Carvedilol, Furosemide and Spironolactone as prescribed. ECG from 05/2020 reviewed.  CHF: She denies lower extremity edema or SOB. She  is taking Sacubitirl-Valsartan, Carvedilol, Furosemide and Spironolactone as prescribed. Echo from 05/2019 reviewed. She follows with cardiology.  HLD: Her last LDL was 158, triglycerides 173, 03/2020. She denies myalgias on Rosuvastatin. She does not consume a low fat diet.  DM 2: Her last A1C was 7.2%, 05/2020. She is taking Metformin, Bydureon and Lantus as prescribed. Her sugars range. She checks her feet routinely and follows with podiatry. Her last eye exam was 02/2020. Flu 03/2020. Pneumovax 04/2013. Covid- Moderna x 2.  IBS: Mainly diarrhea. She is taking Hyscyamine and Imodium as prescribed. Colonoscopy from 01/2017 reviewed. She follows with GI.  Chronic Left Shoulder Pain/Cervicalgia:  Review of Systems      Past Medical History:  Diagnosis Date  . Carpal tunnel syndrome of right wrist   . Chronic combined systolic (congestive) and diastolic (congestive) heart failure ([place])    a. 03/2017 Echo: EF 20-25%, diff HK, Gr2 DD; b. 09/2017 Echo: EF 20-25%, diff HK, Gr2 DD.  . Diabetes ([place])   . GERD (gastroesophageal reflux disease)   . HLD (hyperlipidemia)   . Hypertension   . NICM (nonischemic cardiomyopathy) ([place])    a. 03/2017 Echo: EF 20-25%, diff HK, Gr2 DD, mild to mod MR, nl RV fxn;  b. 03/2017 Cath: mild nonobs dzs, EF 25%; c. 09/2017 Echo: EF 20-25%, diff HK. Gr2 DD. Mild MR. Mildly reduced RV fxn.  . Pleural effusion, left    a. 03/2017 s/p thoracentesis-->300 ml withdrawn--transudative.  . Pneumonia 02/19/2017     Current Outpatient Medications  Medication Sig Dispense Refill  . albuterol (VENTOLIN HFA) 108 (90 Base) MCG/ACT inhaler Inhale 2 puffs into the lungs every 4 (four) hours as needed for wheezing or shortness of breath. 6.7 g 1  . aspirin 81 MG tablet Take 81 mg by mouth daily.    . carvedilol (COREG) 12.5 MG tablet Take 1.5 tablets (18.75 mg total) by mouth 2 (two) times daily. 270 tablet 3  . chlorhexidine (PERIDEX) 0.12 % solution 15 mLs 2 (two) times daily.    . Exenatide ER (BYDUREON BCISE) 2 MG/0.85ML AUIJ Inject 2 mg into the skin once a week. 4 pen 1  . furosemide (LASIX) 20 MG tablet Take 1 tablet (20 mg total) by mouth daily. 90 tablet 3  . glucose blood (CONTOUR TEST) test strip by Other route Two (2) times a day.    [name] HYDROcodone-acetaminophen (NORCO/VICODIN) 5-325 MG tablet Take 1 tablet by mouth every 6 (six) hours as needed.    . hydrocortisone 2.5 % lotion Apply topically.    [name] Hyoscyamine Sulfate SL 0.125 MG SUBL Dissolve 1-2 tablets on your tongue every 4-6 hours as needed for cramping or diarrhea    . ibuprofen (ADVIL) 400 MG tablet Take 400 mg by mouth 3 (three) times daily.    . insulin glargine (LANTUS) 100 UNIT/ML Solostar Pen Inject 14 Units into the skin at bedtime. 15 mL 3  . Insulin Pen Needle 31G X 5 MM MISC Use as directed with Lantus 100  each 3  . loperamide (IMODIUM) 1 MG/5ML solution Take 1 mg by mouth daily.    . meloxicam (MOBIC) 15 MG tablet Take 1 tablet (15 mg total) by mouth daily. 90 tablet 1  . metFORMIN (GLUCOPHAGE) 500 MG tablet Take 1 tablet (500 mg total) by mouth 2 (two) times daily with a meal. 180 tablet 1  . ondansetron (ZOFRAN) 4 MG tablet Take by mouth.    . rosuvastatin (CRESTOR) 20 MG tablet Take 1 tablet (20 mg total) by mouth daily. 90 tablet 3  . sacubitril-valsartan (ENTRESTO) 49-51 MG Take 1 tablet by mouth 2 (two) times daily. 30 tablet 3  . spironolactone (ALDACTONE) 25 MG tablet Take 1 tablet (25 mg total) by mouth daily. 90 tablet  2   No current facility-administered medications for this visit.    No Known Allergies  Family History  Problem Relation Age of Onset  . Lung cancer Mother   . Hypertension Father   . CAD Father        a. MI age 55  . Heart disease Father   . COPD Neg Hx   . Diabetes Mellitus II Neg Hx     Social History   Socioeconomic History  . Marital status: Single    Spouse name: Not on file  . Number of children: 3  . Years of education: 18  . Highest education level: 12th grade  Occupational History  . Occupation: home maker  Tobacco Use  . Smoking status: Never Smoker  . Smokeless tobacco: Never Used  Vaping Use  . Vaping Use: Never used  Substance and Sexual Activity  . Alcohol use: No  . Drug use: No  . Sexual activity: Never  Other Topics Concern  . Not on file  Social History Narrative  . Not on file   Social Determinants of Health   Financial Resource Strain: Not on file  Food Insecurity: Not on file  Transportation Needs: Not on file  Physical Activity: Not on file  Stress: Not on file  Social Connections: Not on file  Intimate Partner Violence: Not on file     Constitutional: Denies fever, malaise, fatigue, headache or abrupt weight changes.  HEENT: Denies eye pain, eye redness, ear pain, ringing in the ears, wax buildup, runny nose, nasal congestion, bloody nose, or sore throat. Respiratory: Denies difficulty breathing, shortness of breath, cough or sputum production.   Cardiovascular: Denies chest pain, chest tightness, palpitations or swelling in the hands or feet.  Gastrointestinal: Pt reports abdominal cramping, diarrhea. Denies abdominal pain, bloating, constipation, or blood in the stool.  GU: Denies urgency, frequency, pain with urination, burning sensation, blood in urine, odor or discharge. Musculoskeletal: Pt reports chronic neck and shoulder pain. Denies decrease in range of motion, difficulty with gait, or joint swelling.  Skin: Denies redness,  rashes, lesions or ulcercations.  Neurological: Denies dizziness, difficulty with memory, difficulty with speech or problems with balance and coordination.  Psych: Denies anxiety, depression, SI/HI.  No other specific complaints in a complete review of systems (except as listed in HPI above).  Objective:   Physical Exam  There were no vitals taken for this visit. Wt Readings from Last 3 Encounters:  05/20/20 116 lb (52.6 kg)  05/18/20 114 lb 9.6 oz (52 kg)  05/11/20 115 lb (52.2 kg)    General: Appears their stated age, well developed, well nourished in NAD. Skin: Warm, dry and intact. No rashes, lesions or ulcerations noted. HEENT: Head: normal shape and size; Eyes:  sclera white, no icterus, conjunctiva pink, PERRLA and EOMs intact; Ears: Tm's gray and intact, normal light reflex; Nose: mucosa pink and moist, septum midline; Throat/Mouth: Teeth present, mucosa pink and moist, no exudate, lesions or ulcerations noted.  Neck:  Neck supple, trachea midline. No masses, lumps or thyromegaly present.  Cardiovascular: Normal rate and rhythm. S1,S2 noted.  No murmur, rubs or gallops noted. No JVD or BLE edema. No carotid bruits noted. Pulmonary/Chest: Normal effort and positive vesicular breath sounds. No respiratory distress. No wheezes, rales or ronchi noted.  Abdomen: Soft and nontender. Normal bowel sounds. No distention or masses noted. Liver, spleen and kidneys non palpable. Musculoskeletal: Normal range of motion. No signs of joint swelling. No difficulty with gait.  Neurological: Alert and oriented. Cranial nerves II-XII grossly intact. Coordination normal.  Psychiatric: Mood and affect normal. Behavior is normal. Judgment and thought content normal.   EKG:  BMET    Component Value Date/Time   NA 140 03/28/2020 1044   NA 142 06/19/2018 1905   NA 129 (L) 04/25/2013 2004   K 4.3 03/28/2020 1044   K 5.7 (H) 04/25/2013 2004   CL 105 03/28/2020 1044   CL 97 (L) 04/25/2013 2004    CO2 24 03/28/2020 1044   CO2 12 (L) 04/25/2013 2004   GLUCOSE 136 (H) 03/28/2020 1044   GLUCOSE 370 (H) 04/25/2013 2004   BUN 15 03/28/2020 1044   BUN 14 06/19/2018 1905   BUN 17 04/25/2013 2004   CREATININE 0.96 03/28/2020 1044   CALCIUM 8.9 03/28/2020 1044   CALCIUM 9.7 04/25/2013 2004   GFRNONAA 66 03/28/2020 1044   GFRAA 77 03/28/2020 1044    Lipid Panel     Component Value Date/Time   CHOL 248 (H) 03/28/2020 1044   CHOL 216 (H) 06/19/2018 1905   TRIG 173 (H) 03/28/2020 1044   HDL 59 03/28/2020 1044   HDL 53 06/19/2018 1905   CHOLHDL 4.2 03/28/2020 1044   LDLCALC 158 (H) 03/28/2020 1044    CBC    Component Value Date/Time   WBC 7.4 03/28/2020 1044   RBC 4.22 03/28/2020 1044   HGB 11.5 (L) 03/28/2020 1044   HGB 11.6 06/19/2018 1905   HCT 35.4 03/28/2020 1044   HCT 35.4 06/19/2018 1905   PLT 357 03/28/2020 1044   PLT 435 06/19/2018 1905   MCV 83.9 03/28/2020 1044   MCV 85 06/19/2018 1905   MCV 88 04/25/2013 2004   MCH 27.3 03/28/2020 1044   MCHC 32.5 03/28/2020 1044   RDW 12.6 03/28/2020 1044   RDW 12.4 06/19/2018 1905   RDW 12.3 04/25/2013 2004   LYMPHSABS 2,264 03/28/2020 1044   MONOABS 0.7 05/21/2018 1111   EOSABS 118 03/28/2020 1044   BASOSABS 67 03/28/2020 1044    Hgb A1C Lab Results  Component Value Date   HGBA1C 7.2 (A) 05/11/2020          Assessment & Plan:   Webb Silversmith, NP This visit occurred during the SARS-CoV-2 public health emergency.  Safety protocols were in place, including screening questions prior to the visit, additional usage of staff PPE, and extensive cleaning of exam room while observing appropriate contact time as indicated for disinfecting solutions.

## 2020-09-05 ENCOUNTER — Ambulatory Visit: Payer: Medicare Other | Admitting: Physical Therapy

## 2020-09-07 ENCOUNTER — Ambulatory Visit: Payer: Medicare Other | Attending: Family Medicine | Admitting: Physical Therapy

## 2020-09-07 ENCOUNTER — Other Ambulatory Visit: Payer: Self-pay

## 2020-09-07 ENCOUNTER — Encounter: Payer: Self-pay | Admitting: Physical Therapy

## 2020-09-07 DIAGNOSIS — M6281 Muscle weakness (generalized): Secondary | ICD-10-CM | POA: Diagnosis present

## 2020-09-07 DIAGNOSIS — R262 Difficulty in walking, not elsewhere classified: Secondary | ICD-10-CM | POA: Insufficient documentation

## 2020-09-07 DIAGNOSIS — M25675 Stiffness of left foot, not elsewhere classified: Secondary | ICD-10-CM | POA: Diagnosis present

## 2020-09-07 DIAGNOSIS — E119 Type 2 diabetes mellitus without complications: Secondary | ICD-10-CM

## 2020-09-07 DIAGNOSIS — M79672 Pain in left foot: Secondary | ICD-10-CM | POA: Insufficient documentation

## 2020-09-07 MED ORDER — INSULIN GLARGINE 100 UNIT/ML SOLOSTAR PEN: 14.0000 [IU] | PEN_INJECTOR | Freq: Every day | SUBCUTANEOUS | 3 refills | Status: DC

## 2020-09-07 NOTE — Therapy (Signed)
[facility]ICINE [address], [place], [address] Phone: [phone]   Fax:  [phone]  Physical Therapy Treatment  Patient Details  Name: Erin Hayes MRN: [id number] Date of Birth: 21-Dec-1965 Referring Provider (PT): Candelaria Stagers, [place]   Encounter Date: 09/07/2020   PT End of Session - 09/07/20 0932    Visit Number 3    Number of Visits 24    Date for PT Re-Evaluation 11/16/20    Authorization Type [place] MEDICA[employer] PLAN reporting period from 08/24/2020    Authorization Time Period 27 PT/OT/ST visit per calendar year    Authorization - Visit Number 19    Authorization - Number of Visits 27    Progress Note Due on Visit 10    PT Start Time 0933    PT Stop Time 1013    PT Time Calculation (min) 40 min    Activity Tolerance Patient tolerated treatment well    Behavior During Therapy [facility] for tasks assessed/performed           Past Medical History:  Diagnosis Date  . Carpal tunnel syndrome of right wrist   . Chronic combined systolic (congestive) and diastolic (congestive) heart failure (HCC)    a. 03/2017 Echo: EF 20-25%, diff HK, Gr2 DD; b. 09/2017 Echo: EF 20-25%, diff HK, Gr2 DD.  . Diabetes (HCC)   . GERD (gastroesophageal reflux disease)   . HLD (hyperlipidemia)   . Hypertension   . NICM (nonischemic cardiomyopathy) (HCC)    a. 03/2017 Echo: EF 20-25%, diff HK, Gr2 DD, mild to mod MR, nl RV fxn;  b. 03/2017 Cath: mild nonobs dzs, EF 25%; c. 09/2017 Echo: EF 20-25%, diff HK. Gr2 DD. Mild MR. Mildly reduced RV fxn.  . Pleural effusion, left    a. 03/2017 s/p thoracentesis-->300 ml withdrawn--transudative.  . Pneumonia 02/19/2017    Past Surgical History:  Procedure Laterality Date  . COLONOSCOPY WITH PROPOFOL N/A 01/21/2017   Procedure: COLONOSCOPY WITH PROPOFOL;  Surgeon: Wyline Mood, MD;  Location: [facility] ENDOSCOPY;  Service: Gastroenterology;  Laterality: N/A;  .  ESOPHAGOGASTRODUODENOSCOPY (EGD) WITH PROPOFOL N/A 01/21/2017   Procedure: ESOPHAGOGASTRODUODENOSCOPY (EGD) WITH PROPOFOL;  Surgeon: Wyline Mood, MD;  Location: [facility] ENDOSCOPY;  Service: Gastroenterology;  Laterality: N/A;  . FLEXIBLE SIGMOIDOSCOPY N/A 11/04/2016   Procedure: FLEXIBLE SIGMOIDOSCOPY;  Surgeon: Charlott Rakes, MD;  Location: [facility] ENDOSCOPY;  Service: Endoscopy;  Laterality: N/A;  . LEFT HEART CATH AND CORONARY ANGIOGRAPHY N/A 04/05/2017   Procedure: LEFT HEART CATH AND CORONARY ANGIOGRAPHY;  Surgeon: Iran Ouch, MD;  Location: [facility] INVASIVE CV LAB;  Service: Cardiovascular;  Laterality: N/A;  . LITHOTRIPSY      There were no vitals filed for this visit.   Subjective Assessment - 09/07/20 0935    Subjective Patient reports her pain is low today, 1/10, in the left foot. Has been doing her HEP but not praticing walking around in a shoe. She states she was sore after her last PT session for a day.    Pertinent History Patient is a 55 y.o. female who presents to outpatient physical therapy with a referral for medical diagnosis closed fracture of head of metatarsal, left. This patient's chief complaints consist of left foot pain, leading to the following functional deficits:  difficulty with walking, using stairs, getting up and down stairs, prolonged standing (sits in a w/c to do a lot of tasks now), completing tasks without fear of falling. Relevant past medical history  and comorbidities include carpal tunnel syndrome (maybe undiagnosed left), CHF, Diabetes, hypertension, osteoporosis, nonischemic cardiomyopathy, hx of pleural effusion left lung (2018), hx left heart cath and coronary angiography (2018), L foot fracture (fall Oct 2020), left shoulder pain. Patient denies hx of cancer, stroke, seizures, unexplained weight loss, changes in bowel or bladder problems, new onset stumbling or dropping things.    Limitations House hold activities;Lifting;Standing;Walking   Functional  Limitations: difficulty with walking, using stairs, getting up and down stairs, prolonged standing (sits in a w/c to do a lot of tasks now), completing tasks without fear of falling.   Diagnostic tests L foot Radiograph report 08/16/2020: "3 views of skeletally mature adult left foot: No fractures noted.  Severe osteopenia noted.  The fracture may have consolidated."    Patient Stated Goals "to be able to walk longer without having to use wheelchair"    Currently in Pain? Yes    Pain Score 1     Pain Onset More than a month ago          OBJECTIVE FOTO = 43 (09/07/2020)    TREATMENT:  Therapeutic exercise:to centralize symptoms and improve ROM, strength, muscular endurance, and activity tolerance required for successful completion of functional activities. - seated B heel raises, 1x20 with 9# DB on knees, 1x20 L side only with 15# DB on L knee.  - Standing B heel raises, 1x20 with B UE support - standing B toe raises, 1x20 with B UE support - standing B arch raise, 1x20 with U UE support.  - standing great toe flexion into floor with yellow theraband, 2x20 L side, BUE support.  - standing lesser toe flexion into floor with yellow theraband, 2x20 L side, BUE support.  - standing arch raise with yellow band wrapped around foot, 1x20 each side. (see HEP for instructions).  - standing B heel raise with yellow theraband looped around heels (pressing against it outward), 1x20, BUE support - toe off practice 1x20 each side, unshod, U UE support.  - ambulation in athletic shoes x 250 feet focusing on normal gait pattern. Discussed how to perform at home.  - Education on HEP including handout   Pt required multimodal cuing for proper technique and to facilitate improved neuromuscular control, strength, range of motion, and functional ability resulting in improved performance and form.  HOME EXERCISE PROGRAM  [email or web address] SS [id number] Home Exercise Program [[email or web address]]  Foot supination control  with elastic band wrap -  Repeat 20 Times, Hold 1 Second(s), Complete 2 Sets, Perform 1 Times a Day  Heel raise with band around heels -  Repeat 20 Times, Hold 1 Second(s), Complete 2 Sets, Perform 1 Times a Day  GREAT TOE FLEXION WITH BAND -  Repeat 20 Times, Hold 1 Second(s), Complete 2 Sets, Perform 1 Times a Day  TOE FLEXION WITH BAND: TOES 2-5  -  Repeat 20 Times, Hold 1 Second(s), Complete 2 Sets, Perform 1 Times a Day  Gait training: Toe off -  Repeat 20 Times, Hold 1 Second(s), Complete 2 Sets, Perform 1 Times a Day  Access Code: [email or web address] URL: [email or web address] Date: 09/07/2020 Prepared by: [name]  Exercises Seated Figure 4 Ankle Inversion with Resistance - 1 x daily - 2 sets - 20 reps - 2 seconds hold Seated Ankle Eversion with Resistance - 1 x daily - 2 sets - 20 reps - 2 seconds hold Standing Single Leg Stance with Unilateral Counter Support - 1 x daily - 3 reps - 30 seconds hold  PT Education - 09/07/20 0937    Education Details Exercise purpose/form. Self management techniques    Person(s) Educated Patient    Methods Explanation;Demonstration;Tactile cues;Verbal cues    Comprehension Verbalized understanding;Returned demonstration;Verbal cues required;Tactile cues required;Need further instruction            PT Short Term Goals - 09/07/20 1024      PT SHORT TERM GOAL #1   Title Be independent with initial home exercise program for self-management of symptoms.    Baseline initial HEP provided at IE (08/24/2020);    Time 2    Period Weeks    Status Achieved    Target Date 09/07/20             PT Long Term Goals - 08/24/20 2033      PT LONG TERM GOAL #1   Title Be independent with a long-term home exercise program for self-management of symptoms.    Baseline initial HEP provided at IE (08/24/2020);    Time 12    Period Weeks    Status New   TARGET DATE FOR ALL LONG TERM GOALS: 11/16/2020     PT LONG TERM GOAL #2   Title  Demonstrate improved FOTO score to 59 by visit #13 to demonstrate improvement in overall condition and self-reported functional ability.    Baseline 43 (08/24/2020);    Time 12    Period Weeks    Status New      PT LONG TERM GOAL #3   Title Patient will ambulate equal or greater than 600 feet during 6 minute walk test without post-op shoe or assistive device to improve patient's household and community mobility.    Baseline ambulated only ~ 20 feet without post-op shoe (08/24/2020);    Time 12    Period Weeks    Status New      PT LONG TERM GOAL #4   Title Improve left ankle strength to 4+/5 with no increase in pain for improved ability to allow patient to complete valued functional tasks such as reaching walking and community activities.    Baseline limited and painfull - see objective exam (08/24/2020);    Time 12    Period Weeks    Status New      PT LONG TERM GOAL #5   Title Complete community, work and/or recreational activities without limitation due to current condition.    Baseline Functional Limitations: difficulty with walking, using stairs, getting up and down stairs, prolonged standing (sits in a w/c to do a lot of tasks now), completing tasks without fear of falling (08/24/2020);    Time 12    Period Weeks    Status New      Additional Long Term Goals   Additional Long Term Goals Yes      PT LONG TERM GOAL #6   Title Patient will demonstrate L ankle/toe A/PROM equal or greater than R ankle/toe A/PROM to improve walking mechanics to improve household and community ambulation.    Baseline limited and painful - see objective exam (08/24/2020);    Time 12    Period Weeks    Status New                 Plan - 09/07/20 1023    Clinical Impression Statement Patient tolerated treatment well overall with some increased soreness by end of session. Patient able to advance to standing exercises with more complex motor control. Continues to demonstrate excessive pronation at  L ankle when not  actively doing exercise targeted at improving foot posture. Updated HEP as appropriate. No improvement on self-reported function via FOTO questionnaire. Patient would benefit from continued management of limiting condition by skilled physical therapist to address remaining impairments and functional limitations to work towards stated goals and return to PLOF or maximal functional independence.    Personal Factors and Comorbidities Comorbidity 3+;Past/Current Experience;Fitness;Time since onset of injury/illness/exacerbation    Comorbidities carpal tunnel syndrome (maybe undiagnosed left), CHF, Diabetes, hypertension, osteoporosis, nonischemic cardiomyopathy, hx of pleural effusion left lung (2018), hx left heart cath and coronary angiography (2018), L foot fracture (fall Oct 2020), left shoulder pain.    Examination-Activity Limitations Caring for Others;Lift;Stand;Locomotion Level;Carry;Squat;Stairs    Examination-Participation Restrictions Cleaning;Laundry;Meal Prep;Interpersonal Relationship;Community Activity;Yard Work;Shop    Stability/Clinical Decision Making Evolving/Moderate complexity    Rehab Potential Good    PT Frequency 2x / week    PT Duration 12 weeks    PT Treatment/Interventions ADLs/Self Care Home Management;Aquatic Therapy;Biofeedback;Cryotherapy;Electrical Stimulation;Moist Heat;DME Instruction;Gait training;Stair training;Functional mobility training;Therapeutic activities;Therapeutic exercise;Balance training;Neuromuscular re-education;Patient/family education;Orthotic Fit/Training;Manual techniques;Energy conservation;Spinal Manipulations;Joint Manipulations;Taping    PT Next Visit Plan ankle/foot strengthening as tolerated, wean from orthopedic shoe as tolerated. Balance training    PT Home Exercise Plan medbridge  Access Code: [email or web address]     [email or web address] SS [id number] Home Exercise Program ([id number])    Consulted and Agree with Plan of Care Patient            Patient will benefit from skilled therapeutic intervention in order to improve the following deficits and impairments:  Improper body mechanics,Pain,Decreased coordination,Decreased activity tolerance,Decreased endurance,Decreased range of motion,Decreased strength,Hypomobility,Impaired perceived functional ability,Abnormal gait,Increased fascial restricitons,Impaired sensation,Decreased mobility,Postural dysfunction,Decreased balance,Difficulty walking,Impaired flexibility,Increased edema  Visit Diagnosis: Pain in left foot  Stiffness of left foot, not elsewhere classified  Muscle weakness (generalized)  Difficulty in walking, not elsewhere classified     Problem List Patient Active Problem List   Diagnosis Date Noted  . NICM (nonischemic cardiomyopathy) ([place]) 08/22/2017  . CHF (congestive heart failure) ([place]) 04/03/2017  . Uncontrolled type 2 diabetes mellitus with hyperglycemia ([place])   . Essential hypertension   . Hypercholesterolemia 11/10/2012   Everlean Alstrom. Graylon Good, PT, DPT 09/07/20, 10:25 AM  [facility] [address], [place], [address] Phone: [phone]   Fax:  [phone]  Name: Erin Hayes MRN: [id number] Date of Birth: March 14, 1966

## 2020-09-09 ENCOUNTER — Encounter: Payer: Self-pay | Admitting: Internal Medicine

## 2020-09-09 ENCOUNTER — Ambulatory Visit (INDEPENDENT_AMBULATORY_CARE_PROVIDER_SITE_OTHER): Payer: Medicare Other | Admitting: Internal Medicine

## 2020-09-09 ENCOUNTER — Other Ambulatory Visit: Payer: Self-pay

## 2020-09-09 VITALS — BP 166/99 | HR 83 | Temp 97.5°F | Resp 18 | Ht 59.0 in | Wt 119.0 lb

## 2020-09-09 DIAGNOSIS — E7800 Pure hypercholesterolemia, unspecified: Secondary | ICD-10-CM

## 2020-09-09 DIAGNOSIS — Z23 Encounter for immunization: Secondary | ICD-10-CM | POA: Diagnosis not present

## 2020-09-09 DIAGNOSIS — E1165 Type 2 diabetes mellitus with hyperglycemia: Secondary | ICD-10-CM

## 2020-09-09 DIAGNOSIS — Z794 Long term (current) use of insulin: Secondary | ICD-10-CM

## 2020-09-09 DIAGNOSIS — E119 Type 2 diabetes mellitus without complications: Secondary | ICD-10-CM

## 2020-09-09 DIAGNOSIS — L309 Dermatitis, unspecified: Secondary | ICD-10-CM

## 2020-09-09 DIAGNOSIS — I10 Essential (primary) hypertension: Secondary | ICD-10-CM | POA: Diagnosis not present

## 2020-09-09 DIAGNOSIS — I428 Other cardiomyopathies: Secondary | ICD-10-CM

## 2020-09-09 DIAGNOSIS — I5022 Chronic systolic (congestive) heart failure: Secondary | ICD-10-CM

## 2020-09-09 NOTE — Patient Instructions (Signed)

## 2020-09-09 NOTE — Progress Notes (Signed)
Subjective:    Patient ID: Erin Hayes, female    DOB: [birth year], 55 y.o.   MRN: [id number]  HPI  Pt presents to the clinic today for follow up of chronic conditions. She is establishing care with me today, transferring care from Cyndia Skeeters, NP.  HTN with NICM: Her BP today is 166/99.  She reports this is her typical blood pressure.  She denies headaches, dizziness, visual changes, chest pain or shortness of breath.  She is taking Carvedilol, Furosemide, Sacubitril/Valsartan and Spironolactone as prescribed.  ECG from 05/2020 reviewed.  HLD: His last LDL was 158, triglycerides 173, 11//2021.  She denies myalgias on Rosuvastatin.  She does not consume a low-fat diet  CHF: She denies chronic cough or SOB. She intermittently has swelling in her legs.  She is taking Carvedilol, Furosemide, Sacubitril/Valsartan and Spironolactone as prescribed.  Echo from 05/2019 reviewed.  DM 2: Her last A1C was 7.2%, 05/2020. She does not check her sugars.  She is taking Metformin and Bydureon as prescribed.  She does not check her feet routinely. Her last eye exam was > 1 year ago, [facility]. Flu 05/2019. Pneumovax 04/2013. Covid Moderna x 2.  She also has a noticeable rash to her bilateral upper and lower extremities.  She reports she has bugs in her home and is constantly scratching these areas.  She is unable to treat the bugs due to lack of finances.  She has taken Benadryl and given hydrocortisone lotion 2.5% but reports this does nothing but make her rash burn even more.  She has not seen a dermatologist for the same.   Review of Systems  Past Medical History:  Diagnosis Date  . Carpal tunnel syndrome of right wrist   . Chronic combined systolic (congestive) and diastolic (congestive) heart failure ([place])    a. 03/2017 Echo: EF 20-25%, diff HK, Gr2 DD; b. 09/2017 Echo: EF 20-25%, diff HK, Gr2 DD.  . Diabetes ([place])   . GERD (gastroesophageal reflux disease)   . HLD (hyperlipidemia)   .  Hypertension   . NICM (nonischemic cardiomyopathy) ([place])    a. 03/2017 Echo: EF 20-25%, diff HK, Gr2 DD, mild to mod MR, nl RV fxn;  b. 03/2017 Cath: mild nonobs dzs, EF 25%; c. 09/2017 Echo: EF 20-25%, diff HK. Gr2 DD. Mild MR. Mildly reduced RV fxn.  . Pleural effusion, left    a. 03/2017 s/p thoracentesis-->300 ml withdrawn--transudative.  . Pneumonia 02/19/2017    Current Outpatient Medications  Medication Sig Dispense Refill  . albuterol (VENTOLIN HFA) 108 (90 Base) MCG/ACT inhaler Inhale 2 puffs into the lungs every 4 (four) hours as needed for wheezing or shortness of breath. 6.7 g 1  . aspirin 81 MG tablet Take 81 mg by mouth daily.    . carvedilol (COREG) 12.5 MG tablet Take 1.5 tablets (18.75 mg total) by mouth 2 (two) times daily. 270 tablet 3  . chlorhexidine (PERIDEX) 0.12 % solution 15 mLs 2 (two) times daily.    . Exenatide ER (BYDUREON BCISE) 2 MG/0.85ML AUIJ Inject 2 mg into the skin once a week. 4 pen 1  . furosemide (LASIX) 20 MG tablet Take 1 tablet (20 mg total) by mouth daily. 90 tablet 3  . glucose blood (CONTOUR TEST) test strip by Other route Two (2) times a day.    [name] HYDROcodone-acetaminophen (NORCO/VICODIN) 5-325 MG tablet Take 1 tablet by mouth every 6 (six) hours as needed.    . hydrocortisone 2.5 % lotion Apply topically.    [name]  Hyoscyamine Sulfate SL 0.125 MG SUBL Dissolve 1-2 tablets on your tongue every 4-6 hours as needed for cramping or diarrhea    . ibuprofen (ADVIL) 400 MG tablet Take 400 mg by mouth 3 (three) times daily.    . insulin glargine (LANTUS) 100 UNIT/ML Solostar Pen Inject 14 Units into the skin at bedtime. 15 mL 3  . Insulin Pen Needle 31G X 5 MM MISC Use as directed with Lantus 100 each 3  . loperamide (IMODIUM) 1 MG/5ML solution Take 1 mg by mouth daily.    . meloxicam (MOBIC) 15 MG tablet Take 1 tablet (15 mg total) by mouth daily. 90 tablet 1  . metFORMIN (GLUCOPHAGE) 500 MG tablet Take 1 tablet (500 mg total) by mouth 2 (two) times daily  with a meal. 180 tablet 1  . ondansetron (ZOFRAN) 4 MG tablet Take by mouth.    . rosuvastatin (CRESTOR) 20 MG tablet Take 1 tablet (20 mg total) by mouth daily. 90 tablet 3  . sacubitril-valsartan (ENTRESTO) 49-51 MG Take 1 tablet by mouth 2 (two) times daily. 30 tablet 3  . spironolactone (ALDACTONE) 25 MG tablet Take 1 tablet (25 mg total) by mouth daily. 90 tablet 2   No current facility-administered medications for this visit.    No Known Allergies  Family History  Problem Relation Age of Onset  . Lung cancer Mother   . Hypertension Father   . CAD Father        a. MI age 74  . Heart disease Father   . COPD Neg Hx   . Diabetes Mellitus II Neg Hx     Social History   Socioeconomic History  . Marital status: Single    Spouse name: Not on file  . Number of children: 3  . Years of education: 83  . Highest education level: 12th grade  Occupational History  . Occupation: home maker  Tobacco Use  . Smoking status: Never Smoker  . Smokeless tobacco: Never Used  Vaping Use  . Vaping Use: Never used  Substance and Sexual Activity  . Alcohol use: No  . Drug use: No  . Sexual activity: Never  Other Topics Concern  . Not on file  Social History Narrative  . Not on file   Social Determinants of Health   Financial Resource Strain: Not on file  Food Insecurity: Not on file  Transportation Needs: Not on file  Physical Activity: Not on file  Stress: Not on file  Social Connections: Not on file  Intimate Partner Violence: Not on file     Constitutional: Denies fever, malaise, fatigue, headache or abrupt weight changes.  HEENT: Denies eye pain, eye redness, ear pain, ringing in the ears, wax buildup, runny nose, nasal congestion, bloody nose, or sore throat. Respiratory: Denies difficulty breathing, shortness of breath, cough or sputum production.   Cardiovascular: Pt reports swelling in legs. Denies chest pain, chest tightness, palpitations or swelling in the hands.   Skin: Denies redness, rashes, lesions or ulcercations.  Neurological: Denies dizziness, difficulty with memory, difficulty with speech or problems with balance and coordination.    No other specific complaints in a complete review of systems (except as listed in HPI above).     Objective:   Physical Exam  BP (!) 166/99 (BP Location: Right Arm, Patient Position: Sitting, Cuff Size: Normal)   Pulse 83   Temp (!) 97.5 F (36.4 C) (Temporal)   Resp 18   Ht 4\' 11"  (1.499 m)   Wt  54 kg   SpO2 98%   BMI 24.04 kg/m   Wt Readings from Last 3 Encounters:  05/20/20 116 lb (52.6 kg)  05/18/20 114 lb 9.6 oz (52 kg)  05/11/20 115 lb (52.2 kg)    General: Appears her stated age, well developed, well nourished in NAD. Skin: .       HEENT: Head: normal shape and size; Eyes: sclera white and EOMs intact; Cardiovascular: Normal rate and rhythm. S1,S2 noted.  No murmur, rubs or gallops noted. No JVD or BLE edema. No carotid bruits noted. Pulmonary/Chest: Normal effort and positive vesicular breath sounds. No respiratory distress. No wheezes, rales or ronchi noted.  Musculoskeletal: No difficulty with gait.  Neurological: Alert and oriented.  Psychiatric: Mood and affect normal. Behavior is normal. Judgment and thought content normal.     BMET    Component Value Date/Time   NA 140 03/28/2020 1044   NA 142 06/19/2018 1905   NA 129 (L) 04/25/2013 2004   K 4.3 03/28/2020 1044   K 5.7 (H) 04/25/2013 2004   CL 105 03/28/2020 1044   CL 97 (L) 04/25/2013 2004   CO2 24 03/28/2020 1044   CO2 12 (L) 04/25/2013 2004   GLUCOSE 136 (H) 03/28/2020 1044   GLUCOSE 370 (H) 04/25/2013 2004   BUN 15 03/28/2020 1044   BUN 14 06/19/2018 1905   BUN 17 04/25/2013 2004   CREATININE 0.96 03/28/2020 1044   CALCIUM 8.9 03/28/2020 1044   CALCIUM 9.7 04/25/2013 2004   GFRNONAA 66 03/28/2020 1044   GFRAA 77 03/28/2020 1044    Lipid Panel     Component Value Date/Time   CHOL 248 (H) 03/28/2020  1044   CHOL 216 (H) 06/19/2018 1905   TRIG 173 (H) 03/28/2020 1044   HDL 59 03/28/2020 1044   HDL 53 06/19/2018 1905   CHOLHDL 4.2 03/28/2020 1044   LDLCALC 158 (H) 03/28/2020 1044    CBC    Component Value Date/Time   WBC 7.4 03/28/2020 1044   RBC 4.22 03/28/2020 1044   HGB 11.5 (L) 03/28/2020 1044   HGB 11.6 06/19/2018 1905   HCT 35.4 03/28/2020 1044   HCT 35.4 06/19/2018 1905   PLT 357 03/28/2020 1044   PLT 435 06/19/2018 1905   MCV 83.9 03/28/2020 1044   MCV 85 06/19/2018 1905   MCV 88 04/25/2013 2004   MCH 27.3 03/28/2020 1044   MCHC 32.5 03/28/2020 1044   RDW 12.6 03/28/2020 1044   RDW 12.4 06/19/2018 1905   RDW 12.3 04/25/2013 2004   LYMPHSABS 2,264 03/28/2020 1044   MONOABS 0.7 05/21/2018 1111   EOSABS 118 03/28/2020 1044   BASOSABS 67 03/28/2020 1044    Hgb A1C Lab Results  Component Value Date   HGBA1C 7.2 (A) 05/11/2020           Assessment & Plan:    Webb Silversmith, NP This visit occurred during the SARS-CoV-2 public health emergency.  Safety protocols were in place, including screening questions prior to the visit, additional usage of staff PPE, and extensive cleaning of exam room while observing appropriate contact time as indicated for disinfecting solutions.

## 2020-09-10 ENCOUNTER — Encounter: Payer: Self-pay | Admitting: Internal Medicine

## 2020-09-10 DIAGNOSIS — L309 Dermatitis, unspecified: Secondary | ICD-10-CM | POA: Insufficient documentation

## 2020-09-10 NOTE — Assessment & Plan Note (Signed)
Continue Carvedilol, Furosemide, Sacubitril/Valsartan and Spironolactone C-Met reviewed Reinforced DASH diet She will continue to follow cardiology 

## 2020-09-10 NOTE — Assessment & Plan Note (Signed)
Elevated today but she reports this is her typical blood pressure Continue Carvedilol, Furosemide, Sacubitril/Valsartan and Spironolactone C-Met reviewed Reinforced DASH diet She will continue to follow cardiology

## 2020-09-10 NOTE — Assessment & Plan Note (Signed)
Compensated Continue Carvedilol, Furosemide, Sacubitril-Valsartan and Spironolactone Reinforced DASH diet Encouraged daily weights She will continue to follow with cardiology, will follow

## 2020-09-10 NOTE — Assessment & Plan Note (Signed)
A1c today No urine microalbumin secondary to ACEI therapy Reinforced low-carb diet Advised her to schedule her eye exam and [facility] Foot exam today Flu shot UTD Encouraged her to get her COVID booster Pneumovax today

## 2020-09-10 NOTE — Assessment & Plan Note (Signed)
Lipid profile today Encouraged her to consume a low-fat diet Continue Rosuvastatin

## 2020-09-10 NOTE — Assessment & Plan Note (Signed)
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

## 2020-09-12 ENCOUNTER — Encounter: Payer: Medicaid Other | Admitting: Physical Therapy

## 2020-09-14 ENCOUNTER — Ambulatory Visit: Payer: Medicare Other | Admitting: Physical Therapy

## 2020-09-14 ENCOUNTER — Other Ambulatory Visit: Payer: Self-pay | Admitting: *Deleted

## 2020-09-14 DIAGNOSIS — E1165 Type 2 diabetes mellitus with hyperglycemia: Secondary | ICD-10-CM

## 2020-09-15 ENCOUNTER — Other Ambulatory Visit: Payer: Self-pay

## 2020-09-19 ENCOUNTER — Encounter: Payer: Medicaid Other | Admitting: Physical Therapy

## 2020-09-21 ENCOUNTER — Ambulatory Visit: Payer: Medicare Other | Admitting: Physical Therapy

## 2020-09-21 ENCOUNTER — Telehealth: Payer: Self-pay | Admitting: Physical Therapy

## 2020-09-21 ENCOUNTER — Other Ambulatory Visit: Payer: Self-pay | Admitting: Internal Medicine

## 2020-09-21 MED ORDER — ONDANSETRON HCL 4 MG PO TABS: 4.0000 mg | ORAL_TABLET | Freq: Three times a day (TID) | ORAL | 0 refills | Status: DC | PRN

## 2020-09-21 MED ORDER — LOPERAMIDE HCL 1 MG/5ML PO LIQD: 1.0000 mg | Freq: Every day | ORAL | 0 refills | Status: DC

## 2020-09-21 NOTE — Telephone Encounter (Signed)
Called patient when she did not show up for her 9:45am appointment. Patient states she is still waiting on her ride who is very late today. Explained there was not enough time to see her if she came this late and she agreed to reschedule to tomorrow at [facility] Graylon Good, PT, DPT 09/21/20, 10:05 AM

## 2020-09-21 NOTE — Telephone Encounter (Signed)
   Notes to clinic:  Patient requesting refill for diarrhea and nausea medications   Requested Prescriptions  Pending Prescriptions Disp Refills   loperamide (IMODIUM) 1 MG/5ML solution 120 mL     Sig: Take 5 mLs (1 mg total) by mouth daily.      Over the Counter:  OTC Passed - 09/21/2020 10:15 AM      Passed - Valid encounter within last 12 months    Recent Outpatient Visits           1 week ago Uncontrolled type 2 diabetes mellitus with hyperglycemia [facility])   [facility], Coralie Keens, NP   4 months ago Essential hypertension   [facility], Lupita Raider, FNP   4 months ago Uncontrolled type 2 diabetes mellitus with hyperglycemia [facility])   [facility], Lupita Raider, FNP   6 months ago Uncontrolled type 2 diabetes mellitus with hyperglycemia [facility])   [facility], Lupita Raider, FNP   10 months ago Uncontrolled type 2 diabetes mellitus with hyperglycemia [facility])   [facility], Lupita Raider, FNP       Future Appointments             In 2 months Ralene Bathe, MD [place]               ondansetron (ZOFRAN) 4 MG tablet 20 tablet     Sig: Take by mouth.      Not Delegated - Gastroenterology: Antiemetics Failed - 09/21/2020 10:15 AM      Failed - This refill cannot be delegated      Passed - Valid encounter within last 6 months    Recent Outpatient Visits           1 week ago Uncontrolled type 2 diabetes mellitus with hyperglycemia [facility])   [facility], Coralie Keens, NP   4 months ago Essential hypertension   [facility], Lupita Raider, FNP   4 months ago Uncontrolled type 2 diabetes mellitus with hyperglycemia [facility])   [facility], Lupita Raider, FNP   6 months ago Uncontrolled type 2 diabetes mellitus with hyperglycemia [facility])   [facility], Lupita Raider, FNP   10 months ago Uncontrolled type 2  diabetes mellitus with hyperglycemia [facility])   [facility], Lupita Raider, FNP       Future Appointments             In 2 months Ralene Bathe, MD [place]

## 2020-09-22 ENCOUNTER — Ambulatory Visit: Payer: Medicare Other | Admitting: Physical Therapy

## 2020-09-22 ENCOUNTER — Encounter: Payer: Self-pay | Admitting: Physical Therapy

## 2020-09-22 DIAGNOSIS — R262 Difficulty in walking, not elsewhere classified: Secondary | ICD-10-CM

## 2020-09-22 DIAGNOSIS — M25675 Stiffness of left foot, not elsewhere classified: Secondary | ICD-10-CM

## 2020-09-22 DIAGNOSIS — M6281 Muscle weakness (generalized): Secondary | ICD-10-CM

## 2020-09-22 DIAGNOSIS — M79672 Pain in left foot: Secondary | ICD-10-CM

## 2020-09-22 NOTE — Therapy (Signed)
[place] [facility] [address], [place], [address] Phone: [phone]   Fax:  [phone]  Physical Therapy Treatment  Patient Details  Name: Erin Hayes MRN: [id number] Date of Birth: 01-28-1966 Referring Provider (PT): Felipa Furnace, [place]   Encounter Date: 09/22/2020   PT End of Session - 09/22/20 1314    Visit Number 4    Number of Visits 24    Date for PT Re-Evaluation 11/16/20    Authorization Type [place] reporting period from 08/24/2020    Authorization Time Period 27 PT/OT/ST visit per calendar year    Authorization - Visit Number 81    Authorization - Number of Visits 27    Progress Note Due on Visit 10    PT Start Time 1302    PT Stop Time 1342    PT Time Calculation (min) 40 min    Activity Tolerance Patient tolerated treatment well    Behavior During Therapy [facility] for tasks assessed/performed           Past Medical History:  Diagnosis Date  . Carpal tunnel syndrome of right wrist   . Chronic combined systolic (congestive) and diastolic (congestive) heart failure ([place])    a. 03/2017 Echo: EF 20-25%, diff HK, Gr2 DD; b. 09/2017 Echo: EF 20-25%, diff HK, Gr2 DD.  . Diabetes ([place])   . GERD (gastroesophageal reflux disease)   . HLD (hyperlipidemia)   . Hypertension   . NICM (nonischemic cardiomyopathy) ([place])    a. 03/2017 Echo: EF 20-25%, diff HK, Gr2 DD, mild to mod MR, nl RV fxn;  b. 03/2017 Cath: mild nonobs dzs, EF 25%; c. 09/2017 Echo: EF 20-25%, diff HK. Gr2 DD. Mild MR. Mildly reduced RV fxn.  . Pleural effusion, left    a. 03/2017 s/p thoracentesis-->300 ml withdrawn--transudative.  . Pneumonia 02/19/2017    Past Surgical History:  Procedure Laterality Date  . COLONOSCOPY WITH PROPOFOL N/A 01/21/2017   Procedure: COLONOSCOPY WITH PROPOFOL;  Surgeon: Jonathon Bellows, MD;  Location: [facility] ENDOSCOPY;  Service: Gastroenterology;  Laterality: N/A;  .  ESOPHAGOGASTRODUODENOSCOPY (EGD) WITH PROPOFOL N/A 01/21/2017   Procedure: ESOPHAGOGASTRODUODENOSCOPY (EGD) WITH PROPOFOL;  Surgeon: Jonathon Bellows, MD;  Location: [facility] ENDOSCOPY;  Service: Gastroenterology;  Laterality: N/A;  . FLEXIBLE SIGMOIDOSCOPY N/A 11/04/2016   Procedure: FLEXIBLE SIGMOIDOSCOPY;  Surgeon: Wilford Corner, MD;  Location: [facility] ENDOSCOPY;  Service: Endoscopy;  Laterality: N/A;  . LEFT HEART CATH AND CORONARY ANGIOGRAPHY N/A 04/05/2017   Procedure: LEFT HEART CATH AND CORONARY ANGIOGRAPHY;  Surgeon: Wellington Hampshire, MD;  Location: [place] CV LAB;  Service: Cardiovascular;  Laterality: N/A;  . LITHOTRIPSY      There were no vitals filed for this visit.   Subjective Assessment - 09/22/20 1306    Subjective Patient reports her pain is elevated today for no apparent reason. Rates it 5/10 in left foot. States she felt okay after last treatment session and has been working on walking in her regular shoe. Had her regular shoe on pretty much all day Saturday. She went to [employer] that day and had no additional pain after.  Arrives with orthopedic shoe on the left foot.    Pertinent History Patient is a 55 y.o. female who presents to outpatient physical therapy with a referral for medical diagnosis closed fracture of head of metatarsal, left. This patient's chief complaints consist of left foot pain, leading to the following functional deficits:  difficulty with  walking, using stairs, getting up and down stairs, prolonged standing (sits in a w/c to do a lot of tasks now), completing tasks without fear of falling. Relevant past medical history and comorbidities include carpal tunnel syndrome (maybe undiagnosed left), CHF, Diabetes, hypertension, osteoporosis, nonischemic cardiomyopathy, hx of pleural effusion left lung (2018), hx left heart cath and coronary angiography (2018), L foot fracture (fall Oct 2020), left shoulder pain. Patient denies hx of cancer, stroke,  seizures, unexplained weight loss, changes in bowel or bladder problems, new onset stumbling or dropping things.    Limitations House hold activities;Lifting;Standing;Walking   Functional Limitations: difficulty with walking, using stairs, getting up and down stairs, prolonged standing (sits in a w/c to do a lot of tasks now), completing tasks without fear of falling.   Diagnostic tests L foot Radiograph report 08/16/2020: "3 views of skeletally mature adult left foot: No fractures noted.  Severe osteopenia noted.  The fracture may have consolidated."    Patient Stated Goals "to be able to walk longer without having to use wheelchair"    Currently in Pain? Yes    Pain Score 5     Pain Onset More than a month ago           OBJECTIVE FOTO = 48 (09/07/2020)    TREATMENT:  Therapeutic exercise:to centralize symptoms and improve ROM, strength, muscular endurance, and activity tolerance required for successful completion of functional activities.  Shoes doffed:  - seated heel raises, 1x20 L side only with 15# DB on L knee.  - Standing B heel raises off edge of a2z pad, 2x20 with B UE support - standing B toe raises with heels on a2z pad, 2x20 with U UE support - tandem walking on 5 foot aeormat, 1x5 forwards/backwards with CGA and occasionally stepping off at first.  - standing great toe flexion into floor with red theraband, 1x20 L side, BUE support.  - standing lesser toe flexion into floor with red theraband, 1x20 L side, BUE support.  - standing B heel raise with red theraband looped around heels (pressing against it outward), 1x20, BUE support - standing arch raise with red band wrapped around B ankles, 1x20 each side. - side stepping with red theraband around ankles and BUE support, 1x20 each side. Attempting to maintain neutral foot alignment.  - sit <> stand to chair with airex pad on it with red theraband around ankles, maintaining neutral ankle position. 1x10 (L knee discomfort).   - SLS with lateral glide of contralateral foot on furniture slider, 1x10 each side.  - SLS with circles of contralateral foot on furniture slider, 2x10 each direction each side.  - seated furnature slider slides into dorsiflexion and toe extension under seat, 1x10 on each side, 1x20 on left side.   Shoes donned: - ambulation forward focusing on normal gait pattern 4x30 feet  - side stepping 1x30 feet each side - ambulation backwards, 2x30 feet with CGA for safety.   Pt required multimodal cuing for proper technique and to facilitate improved neuromuscular control, strength, range of motion, and functional ability resulting in improved performance and form.  HOME EXERCISE PROGRAM  [email or web address] [id number] Home Exercise Program [[email or web address]]  Foot supination control with elastic band wrap -  Repeat 20 Times, Hold 1 Second(s), Complete 2 Sets, Perform 1 Times a Day  Heel raise with band around heels -  Repeat 20 Times, Hold 1 Second(s), Complete 2 Sets, Perform 1 Times a Day  GREAT TOE FLEXION WITH BAND -  Repeat  20 Times, Hold 1 Second(s), Complete 2 Sets, Perform 1 Times a Day  TOE FLEXION WITH BAND: TOES 2-5  -  Repeat 20 Times, Hold 1 Second(s), Complete 2 Sets, Perform 1 Times a Day  Gait training: Toe off -  Repeat 20 Times, Hold 1 Second(s), Complete 2 Sets, Perform 1 Times a Day  Access Code: [email or web address] URL: [email or web address] Date: 09/07/2020 Prepared by: [name]  Exercises Seated Figure 4 Ankle Inversion with Resistance - 1 x daily - 2 sets - 20 reps - 2 seconds hold Seated Ankle Eversion with Resistance - 1 x daily - 2 sets - 20 reps - 2 seconds hold Standing Single Leg Stance with Unilateral Counter Support - 1 x daily - 3 reps - 30 seconds hold    PT Education - 09/22/20 1314    Education Details Exercise purpose/form. Self management techniques    Person(s) Educated Patient    Methods Explanation;Demonstration;Tactile cues;Verbal cues     Comprehension Verbalized understanding;Returned demonstration;Verbal cues required;Tactile cues required;Need further instruction            PT Short Term Goals - 09/07/20 1024      PT SHORT TERM GOAL #1   Title Be independent with initial home exercise program for self-management of symptoms.    Baseline initial HEP provided at IE (08/24/2020);    Time 2    Period Weeks    Status Achieved    Target Date 09/07/20             PT Long Term Goals - 08/24/20 2033      PT LONG TERM GOAL #1   Title Be independent with a long-term home exercise program for self-management of symptoms.    Baseline initial HEP provided at IE (08/24/2020);    Time 12    Period Weeks    Status New   TARGET DATE FOR ALL LONG TERM GOALS: 11/16/2020     PT LONG TERM GOAL #2   Title Demonstrate improved FOTO score to 59 by visit #13 to demonstrate improvement in overall condition and self-reported functional ability.    Baseline 43 (08/24/2020);    Time 12    Period Weeks    Status New      PT LONG TERM GOAL #3   Title Patient will ambulate equal or greater than 600 feet during 6 minute walk test without post-op shoe or assistive device to improve patient's household and community mobility.    Baseline ambulated only ~ 20 feet without post-op shoe (08/24/2020);    Time 12    Period Weeks    Status New      PT LONG TERM GOAL #4   Title Improve left ankle strength to 4+/5 with no increase in pain for improved ability to allow patient to complete valued functional tasks such as reaching walking and community activities.    Baseline limited and painfull - see objective exam (08/24/2020);    Time 12    Period Weeks    Status New      PT LONG TERM GOAL #5   Title Complete community, work and/or recreational activities without limitation due to current condition.    Baseline Functional Limitations: difficulty with walking, using stairs, getting up and down stairs, prolonged standing (sits in a w/c to do a  lot of tasks now), completing tasks without fear of falling (08/24/2020);    Time 12    Period Weeks    Status New      Additional  Long Term Goals   Additional Long Term Goals Yes      PT LONG TERM GOAL #6   Title Patient will demonstrate L ankle/toe A/PROM equal or greater than R ankle/toe A/PROM to improve walking mechanics to improve household and community ambulation.    Baseline limited and painful - see objective exam (08/24/2020);    Time 12    Period Weeks    Status New                 Plan - 09/22/20 1352    Clinical Impression Statement Patient tolerated treatment well and stated her pain got lower by end of session. Continued to work on LE and foot strengthening and improving gait pattern. Patient appears to be progressing in weaning from her shoe but continues to use it part of the time. Encouraged her to continue weaning from it as tolerated. Patient would benefit from continued management of limiting condition by skilled physical therapist to address remaining impairments and functional limitations to work towards stated goals and return to PLOF or maximal functional independence.    Personal Factors and Comorbidities Comorbidity 3+;Past/Current Experience;Fitness;Time since onset of injury/illness/exacerbation    Comorbidities carpal tunnel syndrome (maybe undiagnosed left), CHF, Diabetes, hypertension, osteoporosis, nonischemic cardiomyopathy, hx of pleural effusion left lung (2018), hx left heart cath and coronary angiography (2018), L foot fracture (fall Oct 2020), left shoulder pain.    Examination-Activity Limitations Caring for Others;Lift;Stand;Locomotion Level;Carry;Squat;Stairs    Examination-Participation Restrictions Cleaning;Laundry;Meal Prep;Interpersonal Relationship;Community Activity;Yard Work;Shop    Stability/Clinical Decision Making Evolving/Moderate complexity    Rehab Potential Good    PT Frequency 2x / week    PT Duration 12 weeks    PT  Treatment/Interventions ADLs/Self Care Home Management;Aquatic Therapy;Biofeedback;Cryotherapy;Electrical Stimulation;Moist Heat;DME Instruction;Gait training;Stair training;Functional mobility training;Therapeutic activities;Therapeutic exercise;Balance training;Neuromuscular re-education;Patient/family education;Orthotic Fit/Training;Manual techniques;Energy conservation;Spinal Manipulations;Joint Manipulations;Taping    PT Next Visit Plan ankle/foot strengthening as tolerated, wean from orthopedic shoe as tolerated. Balance training    PT Home Exercise Plan medbridge  Access Code: [email or web address] Home Exercise Program ([email or web address])    Consulted and Agree with Plan of Care Patient           Patient will benefit from skilled therapeutic intervention in order to improve the following deficits and impairments:  Improper body mechanics,Pain,Decreased coordination,Decreased activity tolerance,Decreased endurance,Decreased range of motion,Decreased strength,Hypomobility,Impaired perceived functional ability,Abnormal gait,Increased fascial restricitons,Impaired sensation,Decreased mobility,Postural dysfunction,Decreased balance,Difficulty walking,Impaired flexibility,Increased edema  Visit Diagnosis: Pain in left foot  Stiffness of left foot, not elsewhere classified  Muscle weakness (generalized)  Difficulty in walking, not elsewhere classified     Problem List Patient Active Problem List   Diagnosis Date Noted  . Dermatitis 09/10/2020  . NICM (nonischemic cardiomyopathy) ([place]) 08/22/2017  . CHF (congestive heart failure) ([place]) 04/03/2017  . DM (diabetes mellitus), type 2 ([place])   . Essential hypertension   . Hypercholesterolemia 11/10/2012    Everlean Alstrom. Graylon Good, PT, DPT 09/22/20, 1:52 PM  [facility] [address], [place], [address] Phone: [phone]   Fax:  [phone]  Name: KOURTNEY KNAPPER MRN: [id number] Date of Birth: 12/23/1965

## 2020-09-26 ENCOUNTER — Encounter: Payer: Medicaid Other | Admitting: Physical Therapy

## 2020-09-28 ENCOUNTER — Ambulatory Visit: Payer: Medicare Other | Admitting: Physical Therapy

## 2020-10-04 ENCOUNTER — Ambulatory Visit: Payer: Medicare Other | Admitting: Physical Therapy

## 2020-10-06 ENCOUNTER — Encounter: Payer: Medicaid Other | Admitting: Physical Therapy

## 2020-10-12 ENCOUNTER — Telehealth: Payer: Self-pay | Admitting: Physical Therapy

## 2020-10-12 ENCOUNTER — Ambulatory Visit: Payer: Medicare Other | Attending: Podiatry | Admitting: Physical Therapy

## 2020-10-12 DIAGNOSIS — M6281 Muscle weakness (generalized): Secondary | ICD-10-CM | POA: Insufficient documentation

## 2020-10-12 DIAGNOSIS — M25675 Stiffness of left foot, not elsewhere classified: Secondary | ICD-10-CM | POA: Insufficient documentation

## 2020-10-12 DIAGNOSIS — R262 Difficulty in walking, not elsewhere classified: Secondary | ICD-10-CM | POA: Insufficient documentation

## 2020-10-12 DIAGNOSIS — M79672 Pain in left foot: Secondary | ICD-10-CM | POA: Insufficient documentation

## 2020-10-12 NOTE — Telephone Encounter (Signed)
Called patient when she did not show up for her 9am appt today. Patient answered and said she forgot to call to cancel but she did not come because she has a bad sinus infection. Confirmed next scheduled appointment Wed. 10/19/2020 at [place] [facility]. Graylon Good, PT, DPT 10/12/20, 9:15 AM

## 2020-10-19 ENCOUNTER — Telehealth: Payer: Self-pay | Admitting: Physical Therapy

## 2020-10-19 ENCOUNTER — Ambulatory Visit: Payer: Medicare Other | Admitting: Physical Therapy

## 2020-10-19 NOTE — Telephone Encounter (Signed)
Called patient when she did not come to her 9am appointment today. Daughter answered and said she was really sick and could not get out of bed. Said she forgot to call. I reminded her she had missed the last couple of appointments and asked her to call back if she was unable to come to her next appointment. Daughter agreed.   Everlean Alstrom. Graylon Good, PT, DPT 10/19/20, 9:08 AM

## 2020-10-26 ENCOUNTER — Other Ambulatory Visit: Payer: Self-pay

## 2020-10-26 ENCOUNTER — Ambulatory Visit: Payer: Medicare Other | Admitting: Physical Therapy

## 2020-10-26 ENCOUNTER — Encounter: Payer: Self-pay | Admitting: Physical Therapy

## 2020-10-26 DIAGNOSIS — R262 Difficulty in walking, not elsewhere classified: Secondary | ICD-10-CM | POA: Diagnosis present

## 2020-10-26 DIAGNOSIS — M79672 Pain in left foot: Secondary | ICD-10-CM | POA: Diagnosis present

## 2020-10-26 DIAGNOSIS — M6281 Muscle weakness (generalized): Secondary | ICD-10-CM | POA: Diagnosis present

## 2020-10-26 DIAGNOSIS — M25675 Stiffness of left foot, not elsewhere classified: Secondary | ICD-10-CM | POA: Diagnosis present

## 2020-10-26 NOTE — Therapy (Signed)
[facility] [address], [place], [address] Phone: [phone]   Fax:  [phone]  Physical Therapy Treatment  Patient Details  Name: Erin Hayes MRN: [id number] Date of Birth: 1965/05/22 Referring Provider (PT): Felipa Furnace, [place]   Encounter Date: 10/26/2020   PT End of Session - 10/26/20 0917     Visit Number 5    Number of Visits 24    Date for PT Re-Evaluation 11/16/20    Authorization Type [place] reporting period from 08/24/2020    Authorization Time Period 27 PT/OT/ST visit per calendar year    Authorization - Visit Number 21    Authorization - Number of Visits 27    Progress Note Due on Visit 10    PT Start Time 0904    PT Stop Time 0944    PT Time Calculation (min) 40 min    Activity Tolerance Patient tolerated treatment well    Behavior During Therapy [facility] for tasks assessed/performed             Past Medical History:  Diagnosis Date   Carpal tunnel syndrome of right wrist    Chronic combined systolic (congestive) and diastolic (congestive) heart failure ([place])    a. 03/2017 Echo: EF 20-25%, diff HK, Gr2 DD; b. 09/2017 Echo: EF 20-25%, diff HK, Gr2 DD.   Diabetes ([place])    GERD (gastroesophageal reflux disease)    HLD (hyperlipidemia)    Hypertension    NICM (nonischemic cardiomyopathy) ([place])    a. 03/2017 Echo: EF 20-25%, diff HK, Gr2 DD, mild to mod MR, nl RV fxn;  b. 03/2017 Cath: mild nonobs dzs, EF 25%; c. 09/2017 Echo: EF 20-25%, diff HK. Gr2 DD. Mild MR. Mildly reduced RV fxn.   Pleural effusion, left    a. 03/2017 s/p thoracentesis-->300 ml withdrawn--transudative.   Pneumonia 02/19/2017    Past Surgical History:  Procedure Laterality Date   COLONOSCOPY WITH PROPOFOL N/A 01/21/2017   Procedure: COLONOSCOPY WITH PROPOFOL;  Surgeon: Jonathon Bellows, MD;  Location: [facility] ENDOSCOPY;  Service: Gastroenterology;  Laterality: N/A;   ESOPHAGOGASTRODUODENOSCOPY  (EGD) WITH PROPOFOL N/A 01/21/2017   Procedure: ESOPHAGOGASTRODUODENOSCOPY (EGD) WITH PROPOFOL;  Surgeon: Jonathon Bellows, MD;  Location: [facility] ENDOSCOPY;  Service: Gastroenterology;  Laterality: N/A;   FLEXIBLE SIGMOIDOSCOPY N/A 11/04/2016   Procedure: FLEXIBLE SIGMOIDOSCOPY;  Surgeon: Wilford Corner, MD;  [facility] END[facility];  Service: Endoscopy;  Laterality: N/A;   LEFT HEART CATH AND CORONARY ANGIOGRAPHY N/A 04/05/2017   Procedure: LEFT HEART CATH AND CORONARY ANGIOGRAPHY;  Surgeon: Wellington Hampshire, MD;  Location: [facility];  Service: Cardiovascular;  Laterality: N/A;   LITHOTRIPSY      There were no vitals filed for this visit.   Subjective Assessment - 10/26/20 0912     Subjective Patient report she is feeling mostly well today. She was sick for a couple of weeks with a bad sinus infection and still feels a little of the effects of that. She had a good trip in [place] but was unable to get the car she was hoping to bring back. Has continued to wean from the orthopedic shoe and states she wear it when she goes out and about 2 hours a day. No pain upon arrival. States last time she had pain was 2 days ago after being up walking most of the day (had on orthopedic shoe)   Arrives with orthopedic shoe on the left foot.  Pertinent History Patient is a 55 y.o. female who presents to outpatient physical therapy with a referral for medical diagnosis closed fracture of head of metatarsal, left. This patient's chief complaints consist of left foot pain, leading to the following functional deficits:  difficulty with walking, using stairs, getting up and down stairs, prolonged standing (sits in a w/c to do a lot of tasks now), completing tasks without fear of falling. Relevant past medical history and comorbidities include carpal tunnel syndrome (maybe undiagnosed left), CHF, Diabetes, hypertension, osteoporosis, nonischemic cardiomyopathy, hx of pleural effusion left lung (2018), hx left heart  cath and coronary angiography (2018), L foot fracture (fall Oct 2020), left shoulder pain. Patient denies hx of cancer, stroke, seizures, unexplained weight loss, changes in bowel or bladder problems, new onset stumbling or dropping things.    Limitations House hold activities;Lifting;Standing;Walking   Functional Limitations: difficulty with walking, using stairs, getting up and down stairs, prolonged standing (sits in a w/c to do a lot of tasks now), completing tasks without fear of falling.   Diagnostic tests L foot Radiograph report 08/16/2020: "3 views of skeletally mature adult left foot: No fractures noted.  Severe osteopenia noted.  The fracture may have consolidated."    Patient Stated Goals "to be able to walk longer without having to use wheelchair"    Currently in Pain? No/denies    Pain Onset More than a month ago             OBJECTIVE FOTO = 57 (10/26/2020)     TREATMENT:    Therapeutic exercise: to centralize symptoms and improve ROM, strength, muscular endurance, and activity tolerance required for successful completion of functional activities.    Shoes doffed:   - seated heel raises, 1x20 L side only with 15# DB on L knee. - Standing B heel raises off edge of a2z pad, 2x20 with B UE support - standing B toe raises with heels on a2z pad, 2x20 with U UE support - tandem walking on 5 foot aeormat, 1x5 forwards/backwards with CGA and occasionally using UE support on wall (performed in hallway) - side stepping on 5 foot aeromat attempting to use only forefeet, 1x5 each direction with CGA.  - standing great toe flexion into floor with green theraband, 1x20 L side, BUE support. - standing lesser toe flexion into floor with green theraband, 1x20 L side, BUE support. - standing B heel raise with green theraband looped around heels (pressing against it outward), 1x20, BUE support - standing arch raise with green band wrapped around B ankles, 1x20 each side. - side stepping with  yellow theraband around ankles and BUE support, 1x20 feet each side. Attempting to maintain neutral foot alignment. - sit <> stand to plinth with yellow theraband around ankles, maintaining neutral ankle position. 1x10 (L knee discomfort)/.   Pt required multimodal cuing for proper technique and to facilitate improved neuromuscular control, strength, range of motion, and functional ability resulting in improved performance and form.   HOME EXERCISE PROGRAM   [email or web address] SS [id number] Home Exercise Program [[email or web address]]   Foot supination control with elastic band wrap -  Repeat 20 Times, Hold 1 Second(s), Complete 2 Sets, Perform 1 Times a Day   Heel raise with band around heels -  Repeat 20 Times, Hold 1 Second(s), Complete 2 Sets, Perform 1 Times a Day   GREAT TOE FLEXION WITH BAND -  Repeat 20 Times, Hold 1 Second(s), Complete 2 Sets, Perform 1 Times a Day   TOE FLEXION WITH  BAND: TOES 2-5  -  Repeat 20 Times, Hold 1 Second(s), Complete 2 Sets, Perform 1 Times a Day   Gait training: Toe off -  Repeat 20 Times, Hold 1 Second(s), Complete 2 Sets, Perform 1 Times a Day   Access Code: [email or web address] URL: [email or web address] Date: 09/07/2020 Prepared by: [name]   Exercises Seated Figure 4 Ankle Inversion with Resistance - 1 x daily - 2 sets - 20 reps - 2 seconds hold Seated Ankle Eversion with Resistance - 1 x daily - 2 sets - 20 reps - 2 seconds hold Standing Single Leg Stance with Unilateral Counter Support - 1 x daily - 3 reps - 30 seconds hold    PT Education - 10/26/20 1025     Education Details Exercise purpose/form. Self management techniques    Person(s) Educated Patient    Methods Explanation;Demonstration;Tactile cues;Verbal cues    Comprehension Verbalized understanding;Returned demonstration;Verbal cues required;Tactile cues required;Need further instruction              PT Short Term Goals - 09/07/20 1024       PT SHORT TERM GOAL #1   Title Be  independent with initial home exercise program for self-management of symptoms.    Baseline initial HEP provided at IE (08/24/2020);    Time 2    Period Weeks    Status Achieved    Target Date 09/07/20               PT Long Term Goals - 08/24/20 2033       PT LONG TERM GOAL #1   Title Be independent with a long-term home exercise program for self-management of symptoms.    Baseline initial HEP provided at IE (08/24/2020);    Time 12    Period Weeks    Status New   TARGET DATE FOR ALL LONG TERM GOALS: 11/16/2020     PT LONG TERM GOAL #2   Title Demonstrate improved FOTO score to 59 by visit #13 to demonstrate improvement in overall condition and self-reported functional ability.    Baseline 43 (08/24/2020);    Time 12    Period Weeks    Status New      PT LONG TERM GOAL #3   Title Patient will ambulate equal or greater than 600 feet during 6 minute walk test without post-op shoe or assistive device to improve patient's household and community mobility.    Baseline ambulated only ~ 20 feet without post-op shoe (08/24/2020);    Time 12    Period Weeks    Status New      PT LONG TERM GOAL #4   Title Improve left ankle strength to 4+/5 with no increase in pain for improved ability to allow patient to complete valued functional tasks such as reaching walking and community activities.    Baseline limited and painfull - see objective exam (08/24/2020);    Time 12    Period Weeks    Status New      PT LONG TERM GOAL #5   Title Complete community, work and/or recreational activities without limitation due to current condition.    Baseline Functional Limitations: difficulty with walking, using stairs, getting up and down stairs, prolonged standing (sits in a w/c to do a lot of tasks now), completing tasks without fear of falling (08/24/2020);    Time 12    Period Weeks    Status New      Additional Long Term Goals   Additional Long Term  Goals Yes      PT LONG TERM GOAL #6    Title Patient will demonstrate L ankle/toe A/PROM equal or greater than R ankle/toe A/PROM to improve walking mechanics to improve household and community ambulation.    Baseline limited and painful - see objective exam (08/24/2020);    Time 12    Period Weeks    Status New                   Plan - 10/26/20 1014     Clinical Impression Statement Patient tolerated treatment well with no increase in pain. Demonstrates significant improvement in self reported function reflected in FOTO score. Was able to advance to slightly harder resistance on several exercises. Plan to continue with progressive ankle/foot strengthening and functional strengthening as tolerated. Patient to return to prior HEP to be advanced as needed next session. Patient would benefit from continued management of limiting condition by skilled physical therapist to address remaining impairments and functional limitations to work towards stated goals and return to PLOF or maximal functional independence.    Personal Factors and Comorbidities Comorbidity 3+;Past/Current Experience;Fitness;Time since onset of injury/illness/exacerbation    Comorbidities carpal tunnel syndrome (maybe undiagnosed left), CHF, Diabetes, hypertension, osteoporosis, nonischemic cardiomyopathy, hx of pleural effusion left lung (2018), hx left heart cath and coronary angiography (2018), L foot fracture (fall Oct 2020), left shoulder pain.    Examination-Activity Limitations Caring for Others;Lift;Stand;Locomotion Level;Carry;Squat;Stairs    Examination-Participation Restrictions Cleaning;Laundry;Meal Prep;Interpersonal Relationship;Community Activity;Yard Work;Shop    Stability/Clinical Decision Making Evolving/Moderate complexity    Rehab Potential Good    PT Frequency 2x / week    PT Duration 12 weeks    PT Treatment/Interventions ADLs/Self Care Home Management;Aquatic Therapy;Biofeedback;Cryotherapy;Electrical Stimulation;Moist Heat;DME  Instruction;Gait training;Stair training;Functional mobility training;Therapeutic activities;Therapeutic exercise;Balance training;Neuromuscular re-education;Patient/family education;Orthotic Fit/Training;Manual techniques;Energy conservation;Spinal Manipulations;Joint Manipulations;Taping    PT Next Visit Plan ankle/foot strengthening as tolerated, wean from orthopedic shoe as tolerated. Balance training    PT Home Exercise Plan medbridge  Access Code: [email or web address]     [email or web address] SS [id number] Home Exercise Program [facility])    Consulted and Agree with Plan of Care Patient             Patient will benefit from skilled therapeutic intervention in order to improve the following deficits and impairments:  Improper body mechanics, Pain, Decreased coordination, Decreased activity tolerance, Decreased endurance, Decreased range of motion, Decreased strength, Hypomobility, Impaired perceived functional ability, Abnormal gait, Increased fascial restricitons, Impaired sensation, Decreased mobility, Postural dysfunction, Decreased balance, Difficulty walking, Impaired flexibility, Increased edema  Visit Diagnosis: Pain in left foot  Stiffness of left foot, not elsewhere classified  Muscle weakness (generalized)  Difficulty in walking, not elsewhere classified     Problem List Patient Active Problem List   Diagnosis Date Noted   Dermatitis 09/10/2020   NICM (nonischemic cardiomyopathy) ([place]) 08/22/2017   CHF (congestive heart failure) ([place]) 04/03/2017   DM (diabetes mellitus), type 2 ([place])    Essential hypertension    Hypercholesterolemia 11/10/2012    Everlean Alstrom. Graylon Good, PT, DPT 10/26/20, 10:25 AM   [facility] [address], [place], [address] Phone: [phone]   Fax:  [phone]  Name: Erin Hayes MRN: [id number] Date of Birth: 1966/02/11

## 2020-11-02 ENCOUNTER — Ambulatory Visit: Payer: Medicare Other | Admitting: Physical Therapy

## 2020-11-09 ENCOUNTER — Ambulatory Visit: Payer: Medicare Other | Attending: Podiatry

## 2020-11-09 ENCOUNTER — Other Ambulatory Visit: Payer: Self-pay

## 2020-11-09 DIAGNOSIS — R262 Difficulty in walking, not elsewhere classified: Secondary | ICD-10-CM | POA: Diagnosis present

## 2020-11-09 DIAGNOSIS — M79672 Pain in left foot: Secondary | ICD-10-CM | POA: Diagnosis present

## 2020-11-09 DIAGNOSIS — M25512 Pain in left shoulder: Secondary | ICD-10-CM | POA: Insufficient documentation

## 2020-11-09 DIAGNOSIS — M6281 Muscle weakness (generalized): Secondary | ICD-10-CM

## 2020-11-09 DIAGNOSIS — M542 Cervicalgia: Secondary | ICD-10-CM | POA: Diagnosis present

## 2020-11-09 DIAGNOSIS — G8929 Other chronic pain: Secondary | ICD-10-CM

## 2020-11-09 DIAGNOSIS — R202 Paresthesia of skin: Secondary | ICD-10-CM

## 2020-11-09 DIAGNOSIS — M25675 Stiffness of left foot, not elsewhere classified: Secondary | ICD-10-CM | POA: Diagnosis present

## 2020-11-09 NOTE — Therapy (Signed)
[facility] [address], [place], [address] Phone: [phone]   Fax:  [phone]  Physical Therapy Treatment  Patient Details  Name: Erin Hayes MRN: [id number] Date of Birth: 06/14/1965 Referring Provider (PT): Felipa Furnace, [place]   Encounter Date: 11/09/2020   PT End of Session - 11/09/20 1214     Visit Number 6    Number of Visits 24    Date for PT Re-Evaluation 11/16/20    Authorization Type [place] reporting period from 08/24/2020    Authorization Time Period 27 PT/OT/ST visit per calendar year    Authorization - Visit Number 21    Authorization - Number of Visits 27    Progress Note Due on Visit 10    PT Start Time 1115    PT Stop Time 1210    PT Time Calculation (min) 55 min    Equipment Utilized During Treatment Gait belt    Activity Tolerance Patient tolerated treatment well    Behavior During Therapy WFL for tasks assessed/performed             Past Medical History:  Diagnosis Date   Carpal tunnel syndrome of right wrist    Chronic combined systolic (congestive) and diastolic (congestive) heart failure ([place])    a. 03/2017 Echo: EF 20-25%, diff HK, Gr2 DD; b. 09/2017 Echo: EF 20-25%, diff HK, Gr2 DD.   Diabetes ([place])    GERD (gastroesophageal reflux disease)    HLD (hyperlipidemia)    Hypertension    NICM (nonischemic cardiomyopathy) ([place])    a. 03/2017 Echo: EF 20-25%, diff HK, Gr2 DD, mild to mod MR, nl RV fxn;  b. 03/2017 Cath: mild nonobs dzs, EF 25%; c. 09/2017 Echo: EF 20-25%, diff HK. Gr2 DD. Mild MR. Mildly reduced RV fxn.   Pleural effusion, left    a. 03/2017 s/p thoracentesis-->300 ml withdrawn--transudative.   Pneumonia 02/19/2017    Past Surgical History:  Procedure Laterality Date   COLONOSCOPY WITH PROPOFOL N/A 01/21/2017   Procedure: COLONOSCOPY WITH PROPOFOL;  Surgeon: Jonathon Bellows, MD;  Location: [facility] ENDOSCOPY;  Service: Gastroenterology;   Laterality: N/A;   ESOPHAGOGASTRODUODENOSCOPY (EGD) WITH PROPOFOL N/A 01/21/2017   Procedure: ESOPHAGOGASTRODUODENOSCOPY (EGD) WITH PROPOFOL;  Surgeon: Jonathon Bellows, MD;  Location: [facility] ENDOSCOPY;  Service: Gastroenterology;  Laterality: N/A;   FLEXIBLE SIGMOIDOSCOPY N/A 11/04/2016   Procedure: FLEXIBLE SIGMOIDOSCOPY;  Surgeon: Wilford Corner, MD;  Location: [facility] ENDOSCOPY;  Service: Endoscopy;  Laterality: N/A;   LEFT HEART CATH AND CORONARY ANGIOGRAPHY N/A 04/05/2017   Procedure: LEFT HEART CATH AND CORONARY ANGIOGRAPHY;  Surgeon: Wellington Hampshire, MD;  Location: [facility];  Service: Cardiovascular;  Laterality: N/A;   LITHOTRIPSY      There were no vitals filed for this visit.   Subjective Assessment - 11/09/20 1112     Subjective Pt reports feeling fine today without pain. Ambulates into clinic without surgical shoe, say gait feels fine and she doens't feel instability/pain. Feels that HEP is going well and continues to be challenging. No changes to medication and/or falls since last session.    Patient is accompained by: Family member   daughter and granddaughter   Pertinent History Patient is a 55 y.o. female who presents to outpatient physical therapy with a referral for medical diagnosis closed fracture of head of metatarsal, left. This patient's chief complaints consist of left foot pain, leading to the following functional deficits:  difficulty with walking,  using stairs, getting up and down stairs, prolonged standing (sits in a w/c to do a lot of tasks now), completing tasks without fear of falling. Relevant past medical history and comorbidities include carpal tunnel syndrome (maybe undiagnosed left), CHF, Diabetes, hypertension, osteoporosis, nonischemic cardiomyopathy, hx of pleural effusion left lung (2018), hx left heart cath and coronary angiography (2018), L foot fracture (fall Oct 2020), left shoulder pain. Patient denies hx of cancer, stroke, seizures, unexplained  weight loss, changes in bowel or bladder problems, new onset stumbling or dropping things.    Limitations House hold activities;Lifting;Standing;Walking   Functional Limitations: difficulty with walking, using stairs, getting up and down stairs, prolonged standing (sits in a w/c to do a lot of tasks now), completing tasks without fear of falling.   Diagnostic tests L foot Radiograph report 08/16/2020: "3 views of skeletally mature adult left foot: No fractures noted.  Severe osteopenia noted.  The fracture may have consolidated."    Patient Stated Goals "to be able to walk longer without having to use wheelchair"    Currently in Pain? No/denies    Pain Onset More than a month ago            VITALS (beginning): O2: 100% on RA HR: 90 bpm BP: 148/100 mmHg (did not take meds this AM)  VITALS (after multiple therex): O2: 100% on RA HR: 91 bpm BP: 163/103 mmHg BP (after 2x10 diaphragmatic breathing): 141/95 mmHg  VITALS (pot session): BP: 158/97 mmHg   Therapeutic exercise: to centralize symptoms and improve ROM, strength, muscular endurance, and activity tolerance required for successful completion of functional activities.    Shoes doffed:   - seated heel raises, 1x20 L side only with 15# DB on L knee. - Standing B heel raises off edge of a2z pad, 2x20 with B UE support with 2 finger support - standing B toe raises with heels on a2z pad, 2x20 with U UE support with 2 finger support - tandem walking on 5 foot aeormat, 1x5 forwards/backwards with CGA and occasionally using UE support on wall (performed in hallway) - side stepping on 5 foot aeromat attempting to use only forefeet, 1x5 each direction with CGA.  - standing great toe flexion into floor with green theraband, 1x20 L side, U UE support with 2 fingers - standing lesser toe flexion into floor with green theraband, 1x20 L side, U UE support with 2 fingers - standing B heel raise with green theraband looped around heels (pressing  against it outward), 1x20, BUE support 2 finger support - standing arch raise with green band wrapped around B ankles, 1x20 each side with U UE 2 finger support - side stepping with green theraband around ankles and BUE support, 2x10 feet each side. Attempting to maintain neutral foot alignment. - sit <> stand to plinth with yellow theraband around ankles, maintaining neutral ankle position. 1x10 (L knee discomfort). - diaphragmatic breathing with TC at stomach/ribs, to reduce DBP, 2x10  Pt required multimodal cuing for proper technique and to facilitate improved neuromuscular control, strength, range of motion, and functional ability resulting in improved performance and form.   Manual Therapy: to increase joint mobility of the to support increased overall ROM and provide relief to joint musculature. - L ankle DF/PF AROM meausurement pre/post MT - G3 talocrural DF mobs to improve DF AROM - G3 talocrural PF mobs to improve PF AROM    L ankle AROM (pre/post): DF: 20deg // 20deg PF: 20deg // 20deg     PT  Education - 11/09/20 1213     Education Details exercise purpoe/form. self management techniques, medication compliance, s/s high/low BP    Person(s) Educated Patient    Methods Explanation;Demonstration;Tactile cues;Verbal cues    Comprehension Verbalized understanding;Returned demonstration;Verbal cues required;Tactile cues required;Need further instruction              PT Short Term Goals - 09/07/20 1024       PT SHORT TERM GOAL #1   Title Be independent with initial home exercise program for self-management of symptoms.    Baseline initial HEP provided at IE (08/24/2020);    Time 2    Period Weeks    Status Achieved    Target Date 09/07/20               PT Long Term Goals - 08/24/20 2033       PT LONG TERM GOAL #1   Title Be independent with a long-term home exercise program for self-management of symptoms.    Baseline initial HEP provided at IE (08/24/2020);     Time 12    Period Weeks    Status New   TARGET DATE FOR ALL LONG TERM GOALS: 11/16/2020     PT LONG TERM GOAL #2   Title Demonstrate improved FOTO score to 59 by visit #13 to demonstrate improvement in overall condition and self-reported functional ability.    Baseline 43 (08/24/2020);    Time 12    Period Weeks    Status New      PT LONG TERM GOAL #3   Title Patient will ambulate equal or greater than 600 feet during 6 minute walk test without post-op shoe or assistive device to improve patient's household and community mobility.    Baseline ambulated only ~ 20 feet without post-op shoe (08/24/2020);    Time 12    Period Weeks    Status New      PT LONG TERM GOAL #4   Title Improve left ankle strength to 4+/5 with no increase in pain for improved ability to allow patient to complete valued functional tasks such as reaching walking and community activities.    Baseline limited and painfull - see objective exam (08/24/2020);    Time 12    Period Weeks    Status New      PT LONG TERM GOAL #5   Title Complete community, work and/or recreational activities without limitation due to current condition.    Baseline Functional Limitations: difficulty with walking, using stairs, getting up and down stairs, prolonged standing (sits in a w/c to do a lot of tasks now), completing tasks without fear of falling (08/24/2020);    Time 12    Period Weeks    Status New      Additional Long Term Goals   Additional Long Term Goals Yes      PT LONG TERM GOAL #6   Title Patient will demonstrate L ankle/toe A/PROM equal or greater than R ankle/toe A/PROM to improve walking mechanics to improve household and community ambulation.    Baseline limited and painful - see objective exam (08/24/2020);    Time 12    Period Weeks    Status New                   Plan - 11/09/20 1214     Clinical Impression Statement Pt tolerated treatment well today. DBP significantly elevated at start of session,  with pt noting she forgot to take her BP  meds this AM. BP monitored throughout session, and demonstrated improvements in recovery with seated rest break and pursed lip breathing (see vitals section for detail). Interventions continue to focus on ankle stability via strengthening and mobilizations for improved AROM. L ankle DF/PF AROM unchanged from pre/post MT, however, this could be due to short intervention time. Would benefit from increasing duration of MT with test/retest. Pt with improved reports of stiffness post session. Pt will continue to benefit from skilled OPPT services to address deficits and improve overall safety and independence with functional mobility. Will continue per POC.    Personal Factors and Comorbidities Comorbidity 3+;Past/Current Experience;Fitness;Time since onset of injury/illness/exacerbation    Comorbidities carpal tunnel syndrome (maybe undiagnosed left), CHF, Diabetes, hypertension, osteoporosis, nonischemic cardiomyopathy, hx of pleural effusion left lung (2018), hx left heart cath and coronary angiography (2018), L foot fracture (fall Oct 2020), left shoulder pain.    Examination-Activity Limitations Caring for Others;Lift;Stand;Locomotion Level;Carry;Squat;Stairs    Examination-Participation Restrictions Cleaning;Laundry;Meal Prep;Interpersonal Relationship;Community Activity;Yard Work;Shop    Stability/Clinical Decision Making Evolving/Moderate complexity    Rehab Potential Good    PT Frequency 2x / week    PT Duration 12 weeks    PT Treatment/Interventions ADLs/Self Care Home Management;Aquatic Therapy;Biofeedback;Cryotherapy;Electrical Stimulation;Moist Heat;DME Instruction;Gait training;Stair training;Functional mobility training;Therapeutic activities;Therapeutic exercise;Balance training;Neuromuscular re-education;Patient/family education;Orthotic Fit/Training;Manual techniques;Energy conservation;Spinal Manipulations;Joint Manipulations;Taping    PT Next Visit  Plan ankle/foot strengthening as tolerated, wean from orthopedic shoe as tolerated. Balance training    PT Home Exercise Plan medbridge  Access Code: [email or web address]     [email or web address] [id number] Home Exercise Program [facility])    Consulted and Agree with Plan of Care Patient             Patient will benefit from skilled therapeutic intervention in order to improve the following deficits and impairments:  Improper body mechanics, Pain, Decreased coordination, Decreased activity tolerance, Decreased endurance, Decreased range of motion, Decreased strength, Hypomobility, Impaired perceived functional ability, Abnormal gait, Increased fascial restricitons, Impaired sensation, Decreased mobility, Postural dysfunction, Decreased balance, Difficulty walking, Impaired flexibility, Increased edema  Visit Diagnosis: Pain in left foot  Muscle weakness (generalized)  Stiffness of left foot, not elsewhere classified  Difficulty in walking, not elsewhere classified  Chronic left shoulder pain  Cervicalgia  Paresthesia of skin     Problem List Patient Active Problem List   Diagnosis Date Noted   Dermatitis 09/10/2020   NICM (nonischemic cardiomyopathy) ([place]) 08/22/2017   CHF (congestive heart failure) ([place]) 04/03/2017   DM (diabetes mellitus), type 2 ([place])    Essential hypertension    Hypercholesterolemia 11/10/2012   Herminio Commons, PT, DPT 12:22 PM,11/09/20    [facility] [address], [place], [address] Phone: [phone]   Fax:  [phone]  Name: Erin Hayes MRN: [id number] Date of Birth: [birth year]

## 2020-11-23 ENCOUNTER — Ambulatory Visit: Payer: Medicare Other | Admitting: Physical Therapy

## 2020-11-23 ENCOUNTER — Encounter: Payer: Self-pay | Admitting: Physical Therapy

## 2020-11-23 DIAGNOSIS — M6281 Muscle weakness (generalized): Secondary | ICD-10-CM

## 2020-11-23 DIAGNOSIS — M79672 Pain in left foot: Secondary | ICD-10-CM

## 2020-11-23 DIAGNOSIS — R262 Difficulty in walking, not elsewhere classified: Secondary | ICD-10-CM

## 2020-11-23 DIAGNOSIS — M25675 Stiffness of left foot, not elsewhere classified: Secondary | ICD-10-CM

## 2020-11-23 NOTE — Therapy (Addendum)
[facility] [address], [place], [address] Phone: [phone]   Fax:  [phone]  Physical Therapy Treatment / Re-Certification  Patient Details  Name: Erin Hayes MRN: [id number] Date of Birth: 25-May-1965 Referring Provider (PT): Erin Hayes, [place]   Encounter Date: 11/23/2020   PT End of Session - 11/23/20 1041     Visit Number 7    Number of Visits 24    Date for PT Re-Evaluation 02/15/21    Authorization Type [place] reporting period from 08/24/2020    Authorization Time Period 27 PT/OT/ST visit per calendar year    Authorization - Visit Number 22    Authorization - Number of Visits 27    Progress Note Due on Visit 10    PT Start Time 1035    PT Stop Time 1115    PT Time Calculation (min) 40 min    Equipment Utilized During Treatment Gait belt    Activity Tolerance --    Behavior During Therapy WFL for tasks assessed/performed             Past Medical History:  Diagnosis Date   Carpal tunnel syndrome of right wrist    Chronic combined systolic (congestive) and diastolic (congestive) heart failure ([place])    a. 03/2017 Echo: EF 20-25%, diff HK, Gr2 DD; b. 09/2017 Echo: EF 20-25%, diff HK, Gr2 DD.   Diabetes ([place])    GERD (gastroesophageal reflux disease)    HLD (hyperlipidemia)    Hypertension    NICM (nonischemic cardiomyopathy) ([place])    a. 03/2017 Echo: EF 20-25%, diff HK, Gr2 DD, mild to mod MR, nl RV fxn;  b. 03/2017 Cath: mild nonobs dzs, EF 25%; c. 09/2017 Echo: EF 20-25%, diff HK. Gr2 DD. Mild MR. Mildly reduced RV fxn.   Pleural effusion, left    a. 03/2017 s/p thoracentesis-->300 ml withdrawn--transudative.   Pneumonia 02/19/2017    Past Surgical History:  Procedure Laterality Date   COLONOSCOPY WITH PROPOFOL N/A 01/21/2017   Procedure: COLONOSCOPY WITH PROPOFOL;  Surgeon: Jonathon Bellows, MD;  Location: [facility] ENDOSCOPY;  Service: Gastroenterology;   Laterality: N/A;   ESOPHAGOGASTRODUODENOSCOPY (EGD) WITH PROPOFOL N/A 01/21/2017   Procedure: ESOPHAGOGASTRODUODENOSCOPY (EGD) WITH PROPOFOL;  Surgeon: Jonathon Bellows, MD;  Location: [facility] ENDOSCOPY;  Service: Gastroenterology;  Laterality: N/A;   FLEXIBLE SIGMOIDOSCOPY N/A 11/04/2016   Procedure: FLEXIBLE SIGMOIDOSCOPY;  Surgeon: Wilford Corner, MD;  Location: [facility] ENDOSCOPY;  Service: Endoscopy;  Laterality: N/A;   LEFT HEART CATH AND CORONARY ANGIOGRAPHY N/A 04/05/2017   Procedure: LEFT HEART CATH AND CORONARY ANGIOGRAPHY;  Surgeon: Wellington Hampshire, MD;  Location: [facility] CV LAB;  Service: Cardiovascular;  Laterality: N/A;   LITHOTRIPSY      There were no vitals filed for this visit.   Subjective Assessment - 11/23/20 1038     Subjective Pateint reports she had weaned to the tennis shoe for about 3 weeks but a week ago her left foot swelled up and she could not get the tennis shoe on so she switched back to the orthopedic shoe (currently wearing). It is currenly hurting "a little bit" and swollen. Reports it is a 5/10 pain at the fracture site. State she figures her foot swelled up due to wearing the regular shoe but does not recall any provoking factors leading to it suddenly swelling. HEP has been going well. Felt okay after last PT session . States her BP has been fluxuating when she  checks it at [facility].    Patient is accompained by: Family member   daughter and granddaughter   Pertinent History Patient is a 55 y.o. female who presents to outpatient physical therapy with a referral for medical diagnosis closed fracture of head of metatarsal, left. This patient's chief complaints consist of left foot pain, leading to the following functional deficits:  difficulty with walking, using stairs, getting up and down stairs, prolonged standing (sits in a w/c to do a lot of tasks now), completing tasks without fear of falling. Relevant past medical history and comorbidities include carpal tunnel  syndrome (maybe undiagnosed left), CHF, Diabetes, hypertension, osteoporosis, nonischemic cardiomyopathy, hx of pleural effusion left lung (2018), hx left heart cath and coronary angiography (2018), L foot fracture (fall Oct 2020), left shoulder pain. Patient denies hx of cancer, stroke, seizures, unexplained weight loss, changes in bowel or bladder problems, new onset stumbling or dropping things.    Limitations House hold activities;Lifting;Standing;Walking   Functional Limitations: difficulty with walking, using stairs, getting up and down stairs, prolonged standing (sits in a w/c to do a lot of tasks now), completing tasks without fear of falling.   Diagnostic tests L foot Radiograph report 08/16/2020: "3 views of skeletally mature adult left foot: No fractures noted.  Severe osteopenia noted.  The fracture may have consolidated."    Patient Stated Goals "to be able to walk longer without having to use wheelchair"    Currently in Pain? Yes    Pain Score 5     Pain Onset More than a month ago             VITALS (beginning): O2: 198% on RA HR: 85 bpm BP: 180/120 mmHg (did not take meds this AM)   VITALS (after waiting ~5 min of quiet sitting): BP: 178/120 mmHg  Vitals taken at R arm with adult sized manual cuff      Therapeutic exercise: to centralize symptoms and improve ROM, strength, muscular endurance, and activity tolerance required for successful completion of functional activities.    Shoes doffed:  Vitals measurements to assess safety of interventions (see above)   Seated (due to elevated BP) - seated heel raises, 3x20 L side only with 15# DB on L knee. - L Ankle circuit: PF 3x20 green/black/black, eversion 3x20 green, figure 4 inversion 3x20 green, DF 3x20 green/blue/blue.  (Cuing for improved ROM/form, assistance with proper set up, bolster used under lower leg for PF and DF.  - education on importance of blood pressure control, risks of high blood pressure, symptoms to  watch for and when to follow up with emergent or routine medical care.   Pt required multimodal cuing for proper technique and to facilitate improved neuromuscular control, strength, range of motion, and functional ability resulting in improved performance and form.    PT Education - 11/23/20 1041     Education Details Exercise purpose/form. Self management techniques    Person(s) Educated Patient    Methods Explanation;Demonstration;Tactile cues;Verbal cues    Comprehension Verbalized understanding;Returned demonstration;Verbal cues required;Tactile cues required;Need further instruction              PT Short Term Goals - 09/07/20 1024       PT SHORT TERM GOAL #1   Title Be independent with initial home exercise program for self-management of symptoms.    Baseline initial HEP provided at IE (08/24/2020);    Time 2    Period Weeks    Status Achieved    Target Date  09/07/20               PT Long Term Goals - 11/24/20 1712       PT LONG TERM GOAL #1   Title Be independent with a long-term home exercise program for self-management of symptoms.    Baseline initial HEP provided at IE (08/24/2020);    Time 12    Period Weeks    Status Partially Met   TARGET DATE FOR ALL LONG TERM GOALS: 11/16/2020; updated to 02/15/2021     PT LONG TERM GOAL #2   Title Demonstrate improved FOTO score to 59 by visit #13 to demonstrate improvement in overall condition and self-reported functional ability.    Baseline 43 (08/24/2020); 57 (10/26/2020);    Time 12    Period Weeks    Status Partially Met      PT LONG TERM GOAL #3   Title Patient will ambulate equal or greater than 600 feet during 6 minute walk test without post-op shoe or assistive device to improve patient's household and community mobility.    Baseline ambulated only ~ 20 feet without post-op shoe (08/24/2020);    Time 12    Period Weeks    Status Deferred      PT LONG TERM GOAL #4   Title Improve left ankle strength to  4+/5 with no increase in pain for improved ability to allow patient to complete valued functional tasks such as reaching walking and community activities.    Baseline limited and painfull - see objective exam (08/24/2020);    Time 12    Period Weeks    Status Deferred      PT LONG TERM GOAL #5   Title Complete community, work and/or recreational activities without limitation due to current condition.    Baseline Functional Limitations: difficulty with walking, using stairs, getting up and down stairs, prolonged standing (sits in a w/c to do a lot of tasks now), completing tasks without fear of falling (08/24/2020);    Time 12    Period Weeks    Status Partially Met      PT LONG TERM GOAL #6   Title Patient will demonstrate L ankle/toe A/PROM equal or greater than R ankle/toe A/PROM to improve walking mechanics to improve household and community ambulation.    Baseline limited and painful - see objective exam (08/24/2020);    Time 12    Period Weeks    Status Deferred                   Plan - 11/23/20 1141     Clinical Impression Statement Patient tolerated treatment well overall but was limited to seated exercises due to severely elevated diastolic blood pressure to 120 mmHg and elevated systolic blood pressure to 180 mmHg. Did proceed with low systemic demand exercises in sitting due to fact that patient had forgotten to take medication this morning and was otherwise asymptomatic. Reviewed importance of blood pressure control, risks of high blood pressure, symptoms to watch for, and when to follow up with emergent or routine medical care. Patient's left forefoot with mild to minimal edema and TTP over distal 4th distal metatarsal. Tolerated ankle exercises without any increase in pain and was able to progress to greater resistance. Provided stronger bands for use at home and discussed HEP. Patient would benefit from continued management of limiting condition by skilled physical  therapist to address remaining impairments and functional limitations to work towards stated goals and return to [facility]  or maximal functional independence.    Personal Factors and Comorbidities Comorbidity 3+;Past/Current Experience;Fitness;Time since onset of injury/illness/exacerbation    Comorbidities carpal tunnel syndrome (maybe undiagnosed left), CHF, Diabetes, hypertension, osteoporosis, nonischemic cardiomyopathy, hx of pleural effusion left lung (2018), hx left heart cath and coronary angiography (2018), L foot fracture (fall Oct 2020), left shoulder pain.    Examination-Activity Limitations Caring for Others;Lift;Stand;Locomotion Level;Carry;Squat;Stairs    Examination-Participation Restrictions Cleaning;Laundry;Meal Prep;Interpersonal Relationship;Community Activity;Yard Work;Shop    Stability/Clinical Decision Making Evolving/Moderate complexity    Rehab Potential Good    PT Frequency 2x / week    PT Duration 12 weeks    PT Treatment/Interventions ADLs/Self Care Home Management;Aquatic Therapy;Biofeedback;Cryotherapy;Electrical Stimulation;Moist Heat;DME Instruction;Gait training;Stair training;Functional mobility training;Therapeutic activities;Therapeutic exercise;Balance training;Neuromuscular re-education;Patient/family education;Orthotic Fit/Training;Manual techniques;Energy conservation;Spinal Manipulations;Joint Manipulations;Taping    PT Next Visit Plan ankle/foot strengthening as tolerated, wean from orthopedic shoe as tolerated. Balance training    PT Home Exercise Plan medbridge  Access Code: [email or web address]     [email or web address] [id number] Home Exercise Program [place])    Consulted and Agree with Plan of Care Patient             Patient will benefit from skilled therapeutic intervention in order to improve the following deficits and impairments:  Improper body mechanics, Pain, Decreased coordination, Decreased activity tolerance, Decreased endurance, Decreased range of motion,  Decreased strength, Hypomobility, Impaired perceived functional ability, Abnormal gait, Increased fascial restricitons, Impaired sensation, Decreased mobility, Postural dysfunction, Decreased balance, Difficulty walking, Impaired flexibility, Increased edema  Visit Diagnosis: Pain in left foot  Muscle weakness (generalized)  Stiffness of left foot, not elsewhere classified  Difficulty in walking, not elsewhere classified     Problem List Patient Active Problem List   Diagnosis Date Noted   Dermatitis 09/10/2020   NICM (nonischemic cardiomyopathy) ([place]) 08/22/2017   CHF (congestive heart failure) ([place]) 04/03/2017   DM (diabetes mellitus), type 2 ([place])    Essential hypertension    Hypercholesterolemia 11/10/2012    Everlean Alstrom. Graylon Good, PT, DPT 11/23/20, 12:10 PM  [facility] [address] [place], [address] Phone: [phone]   Fax:  [phone]  Name: STARLA DELLER MRN: [id number] Date of Birth: October 13, 1965

## 2020-11-24 NOTE — Addendum Note (Signed)
Addended by: [name] R on: 11/24/2020 05:18 PM   Modules accepted: Orders

## 2020-11-28 ENCOUNTER — Telehealth: Payer: Self-pay | Admitting: Physical Therapy

## 2020-11-28 ENCOUNTER — Ambulatory Visit: Payer: Medicare Other | Admitting: Physical Therapy

## 2020-11-28 NOTE — Telephone Encounter (Signed)
Called patient when she did not come to her 1pm appointment today. Patient answered and said she didn't realize she had an appointment today. Rescheduled to 4pm tomorrow.   Everlean Alstrom. Graylon Good, PT, DPT 11/28/20, 1:33 PM

## 2020-11-29 ENCOUNTER — Encounter: Payer: Medicare Other | Admitting: Physical Therapy

## 2020-11-30 ENCOUNTER — Ambulatory Visit (INDEPENDENT_AMBULATORY_CARE_PROVIDER_SITE_OTHER): Payer: Medicare Other | Admitting: Dermatology

## 2020-11-30 ENCOUNTER — Other Ambulatory Visit: Payer: Self-pay

## 2020-11-30 DIAGNOSIS — L905 Scar conditions and fibrosis of skin: Secondary | ICD-10-CM | POA: Diagnosis not present

## 2020-11-30 DIAGNOSIS — L819 Disorder of pigmentation, unspecified: Secondary | ICD-10-CM | POA: Diagnosis not present

## 2020-11-30 DIAGNOSIS — L2081 Atopic neurodermatitis: Secondary | ICD-10-CM | POA: Diagnosis not present

## 2020-11-30 MED ORDER — TRIAMCINOLONE ACETONIDE 0.1 % EX CREA: TOPICAL_CREAM | CUTANEOUS | 1 refills | Status: DC

## 2020-11-30 NOTE — Progress Notes (Signed)
   New Patient Visit  Subjective  Erin Hayes is a 55 y.o. female who presents for the following: rash (On the arms and legs x 3 months - patient states not itchy, but she does pick at them leaving marks on her skin.).  The following portions of the chart were reviewed this encounter and updated as appropriate:   Tobacco  Allergies  Meds  Problems  Med Hx  Surg Hx  Fam Hx     Review of Systems:  No other skin or systemic complaints except as noted in HPI or Assessment and Plan.  Objective  Well appearing patient in no apparent distress; mood and affect are within normal limits.  A focused examination was performed including the arms and legs. Relevant physical exam findings are noted in the Assessment and Plan.  Extremities        Assessment & Plan  Atopic neurodermatitis with dyschromia and scarring Extremities Atopic dermatitis (eczema) is a chronic, relapsing, pruritic condition that can significantly affect quality of life. It is often associated with allergic rhinitis and/or asthma and can require treatment with topical medications, phototherapy, or in severe cases a biologic medication called Dupixent in children and adults.   Start TMC 0.1% cream to aa's BID x 2 weeks. Then decrease use to BID 5d/wk. Topical steroids (such as triamcinolone, fluocinolone, fluocinonide, mometasone, clobetasol, halobetasol, betamethasone, hydrocortisone) can cause thinning and lightening of the skin if they are used for too long in the same area. Your physician has selected the right strength medicine for your problem and area affected on the body. Please use your medication only as directed by your physician to prevent side effects.   Discussed other treatment options such as [place]; Opzelura; Dupixent and Adbry and Rinvoq triamcinolone cream (KENALOG) 0.1 % - Extremities Apply to aa's BID x 2 weeks. Then decrease use to BID 5d/wk.  Return in about 2 months (around 01/31/2021).  Luther Redo, CMA, am acting as scribe for Sarina Ser, MD . Documentation: I have reviewed the above documentation for accuracy and completeness, and I agree with the above.  Sarina Ser, MD

## 2020-11-30 NOTE — Patient Instructions (Signed)

## 2020-12-01 ENCOUNTER — Encounter: Payer: Self-pay | Admitting: Dermatology

## 2020-12-01 ENCOUNTER — Encounter: Payer: Self-pay | Admitting: Physical Therapy

## 2020-12-01 ENCOUNTER — Ambulatory Visit: Payer: Medicare Other | Admitting: Physical Therapy

## 2020-12-01 VITALS — BP 140/96 | HR 83

## 2020-12-01 DIAGNOSIS — R262 Difficulty in walking, not elsewhere classified: Secondary | ICD-10-CM

## 2020-12-01 DIAGNOSIS — M79672 Pain in left foot: Secondary | ICD-10-CM

## 2020-12-01 DIAGNOSIS — M25675 Stiffness of left foot, not elsewhere classified: Secondary | ICD-10-CM

## 2020-12-01 DIAGNOSIS — M6281 Muscle weakness (generalized): Secondary | ICD-10-CM

## 2020-12-01 NOTE — Therapy (Signed)
[facility] [address], [place], [address] Phone: [phone]   Fax:  [phone]  Physical Therapy Treatment  Patient Details  Name: Erin Hayes MRN: [id number] Date of Birth: 11/07/1965 Referring Provider (PT): Felipa Furnace, [place]   Encounter Date: 12/01/2020   PT End of Session - 12/01/20 1154     Visit Number 8    Number of Visits 24    Date for PT Re-Evaluation 02/15/21    Authorization Type [employer] reporting period from 08/24/2020    Authorization Time Period 27 PT/OT/ST visit per calendar year    Authorization - Visit Number 23    Authorization - Number of Visits 27    Progress Note Due on Visit 10    PT Start Time 0920    PT Stop Time 0945    PT Time Calculation (min) 25 min    Equipment Utilized During Treatment Gait belt    Behavior During Therapy WFL for tasks assessed/performed             Past Medical History:  Diagnosis Date   Carpal tunnel syndrome of right wrist    Chronic combined systolic (congestive) and diastolic (congestive) heart failure ([place])    a. 03/2017 Echo: EF 20-25%, diff HK, Gr2 DD; b. 09/2017 Echo: EF 20-25%, diff HK, Gr2 DD.   Diabetes ([place])    GERD (gastroesophageal reflux disease)    HLD (hyperlipidemia)    Hypertension    NICM (nonischemic cardiomyopathy) ([place])    a. 03/2017 Echo: EF 20-25%, diff HK, Gr2 DD, mild to mod MR, nl RV fxn;  b. 03/2017 Cath: mild nonobs dzs, EF 25%; c. 09/2017 Echo: EF 20-25%, diff HK. Gr2 DD. Mild MR. Mildly reduced RV fxn.   Pleural effusion, left    a. 03/2017 s/p thoracentesis-->300 ml withdrawn--transudative.   Pneumonia 02/19/2017    Past Surgical History:  Procedure Laterality Date   COLONOSCOPY WITH PROPOFOL N/A 01/21/2017   Procedure: COLONOSCOPY WITH PROPOFOL;  Surgeon: Jonathon Bellows, MD;  Location: [facility] ENDOSCOPY;  Service: Gastroenterology;  Laterality: N/A;   ESOPHAGOGASTRODUODENOSCOPY (EGD)  WITH PROPOFOL N/A 01/21/2017   Procedure: ESOPHAGOGASTRODUODENOSCOPY (EGD) WITH PROPOFOL;  Surgeon: Jonathon Bellows, MD;  Location: [facility] ENDOSCOPY;  Service: Gastroenterology;  Laterality: N/A;   FLEXIBLE SIGMOIDOSCOPY N/A 11/04/2016   Procedure: FLEXIBLE SIGMOIDOSCOPY;  Surgeon: Wilford Corner, MD;  Location: [facility] ENDOSCOPY;  Service: Endoscopy;  Laterality: N/A;   LEFT HEART CATH AND CORONARY ANGIOGRAPHY N/A 04/05/2017   Procedure: LEFT HEART CATH AND CORONARY ANGIOGRAPHY;  Surgeon: Wellington Hampshire, MD;  Location: [facility];  Service: Cardiovascular;  Laterality: N/A;   LITHOTRIPSY      Vitals:   12/01/20 0923  BP: (!) 140/96  Pulse: 83  SpO2: 99%     Subjective Assessment - 12/01/20 0918     Subjective Patient arrived 17 min late today. Patient reports she is feeling well with no pain upon arrival. She is wearing bedroom slipper and states her left foot is still too swollen to fit in the tennis shoe. She states she has been doing HEP and she felt okay after last PT session. She took her blood pressure medication this morning.    Patient is accompained by: Family member   daughter and granddaughter   Pertinent History Patient is a 55 y.o. female who presents to outpatient physical therapy with a referral for medical diagnosis closed fracture of head of metatarsal, left. This  patient's chief complaints consist of left foot pain, leading to the following functional deficits:  difficulty with walking, using stairs, getting up and down stairs, prolonged standing (sits in a w/c to do a lot of tasks now), completing tasks without fear of falling. Relevant past medical history and comorbidities include carpal tunnel syndrome (maybe undiagnosed left), CHF, Diabetes, hypertension, osteoporosis, nonischemic cardiomyopathy, hx of pleural effusion left lung (2018), hx left heart cath and coronary angiography (2018), L foot fracture (fall Oct 2020), left shoulder pain. Patient denies hx of cancer,  stroke, seizures, unexplained weight loss, changes in bowel or bladder problems, new onset stumbling or dropping things.    Limitations House hold activities;Lifting;Standing;Walking   Functional Limitations: difficulty with walking, using stairs, getting up and down stairs, prolonged standing (sits in a w/c to do a lot of tasks now), completing tasks without fear of falling.   Diagnostic tests L foot Radiograph report 08/16/2020: "3 views of skeletally mature adult left foot: No fractures noted.  Severe osteopenia noted.  The fracture may have consolidated."    Patient Stated Goals "to be able to walk longer without having to use wheelchair"    Currently in Pain? No/denies    Pain Onset More than a month ago              Therapeutic exercise: to centralize symptoms and improve ROM, strength, muscular endurance, and activity tolerance required for successful completion of functional activities.    Shoes doffed:   Vitals measurements to assess safety of interventions (see above)   - Standing B heel raises off edge of a2z pad, 2x20 with B UE support with 2 finger support - standing B toe raises with heels on a2z pad, 2x20 with U UE support with 2 finger support - tandem walking on 5 foot aeormat, 1x10 forwards/backwards with CGA and occasionally stepping off - side stepping on 5 foot aeromat attempting to use only forefeet, 2x5 each direction with CGA.  - standing arch raise on a2z pad with green band wrapped around B ankles, 1x20 each side with  B UE support.    Pt required multimodal cuing for proper technique and to facilitate improved neuromuscular control, strength, range of motion, and functional ability resulting in improved performance and form.      PT Education - 12/01/20 1153     Education Details Exercise purpose/form. Self management techniques    Person(s) Educated Patient    Methods Explanation;Demonstration;Tactile cues;Verbal cues    Comprehension Verbalized  understanding;Returned demonstration;Verbal cues required;Tactile cues required;Need further instruction              PT Short Term Goals - 09/07/20 1024       PT SHORT TERM GOAL #1   Title Be independent with initial home exercise program for self-management of symptoms.    Baseline initial HEP provided at IE (08/24/2020);    Time 2    Period Weeks    Status Achieved    Target Date 09/07/20               PT Long Term Goals - 11/24/20 1712       PT LONG TERM GOAL #1   Title Be independent with a long-term home exercise program for self-management of symptoms.    Baseline initial HEP provided at IE (08/24/2020);    Time 12    Period Weeks    Status Partially Met   TARGET DATE FOR ALL LONG TERM GOALS: 11/16/2020; updated to 02/15/2021  PT LONG TERM GOAL #2   Title Demonstrate improved FOTO score to 59 by visit #13 to demonstrate improvement in overall condition and self-reported functional ability.    Baseline 43 (08/24/2020); 57 (10/26/2020);    Time 12    Period Weeks    Status Partially Met      PT LONG TERM GOAL #3   Title Patient will ambulate equal or greater than 600 feet during 6 minute walk test without post-op shoe or assistive device to improve patient's household and community mobility.    Baseline ambulated only ~ 20 feet without post-op shoe (08/24/2020);    Time 12    Period Weeks    Status Deferred      PT LONG TERM GOAL #4   Title Improve left ankle strength to 4+/5 with no increase in pain for improved ability to allow patient to complete valued functional tasks such as reaching walking and community activities.    Baseline limited and painfull - see objective exam (08/24/2020);    Time 12    Period Weeks    Status Deferred      PT LONG TERM GOAL #5   Title Complete community, work and/or recreational activities without limitation due to current condition.    Baseline Functional Limitations: difficulty with walking, using stairs, getting up and  down stairs, prolonged standing (sits in a w/c to do a lot of tasks now), completing tasks without fear of falling (08/24/2020);    Time 12    Period Weeks    Status Partially Met      PT LONG TERM GOAL #6   Title Patient will demonstrate L ankle/toe A/PROM equal or greater than R ankle/toe A/PROM to improve walking mechanics to improve household and community ambulation.    Baseline limited and painful - see objective exam (08/24/2020);    Time 12    Period Weeks    Status Deferred                   Plan - 12/01/20 1153     Clinical Impression Statement Patient arrived 17 min late today due to transportation difficulties so time available for session was limited. Patient tolerated treatment well overall with no complaint of pain. Did arrive wearing normal shoes but continues to have mild swelling in the left forefoot that limits her ability to wear tennis shoes. Also continues to have altered gait pattern and may benefit from more specific gait training interventions next session. Blood pressure within range for safe participation in exercise today. Patient would benefit from continued management of limiting condition by skilled physical therapist to address remaining impairments and functional limitations to work towards stated goals and return to PLOF or maximal functional independence.    Personal Factors and Comorbidities Comorbidity 3+;Past/Current Experience;Fitness;Time since onset of injury/illness/exacerbation    Comorbidities carpal tunnel syndrome (maybe undiagnosed left), CHF, Diabetes, hypertension, osteoporosis, nonischemic cardiomyopathy, hx of pleural effusion left lung (2018), hx left heart cath and coronary angiography (2018), L foot fracture (fall Oct 2020), left shoulder pain.    Examination-Activity Limitations Caring for Others;Lift;Stand;Locomotion Level;Carry;Squat;Stairs    Examination-Participation Restrictions Cleaning;Laundry;Meal Prep;Interpersonal  Relationship;Community Activity;Yard Work;Shop    Stability/Clinical Decision Making Evolving/Moderate complexity    Rehab Potential Good    PT Frequency 2x / week    PT Duration 12 weeks    PT Treatment/Interventions ADLs/Self Care Home Management;Aquatic Therapy;Biofeedback;Cryotherapy;Electrical Stimulation;Moist Heat;DME Instruction;Gait training;Stair training;Functional mobility training;Therapeutic activities;Therapeutic exercise;Balance training;Neuromuscular re-education;Patient/family education;Orthotic Fit/Training;Manual techniques;Energy conservation;Spinal Manipulations;Joint Manipulations;Taping  PT Next Visit Plan ankle/foot strengthening as tolerated, wean from orthopedic shoe as tolerated. Balance training    PT Home Exercise Plan medbridge  Access Code: [email or web address]     [email or web address] [id number] Home Exercise Program [facility])    Consulted and Agree with Plan of Care Patient             Patient will benefit from skilled therapeutic intervention in order to improve the following deficits and impairments:  Improper body mechanics, Pain, Decreased coordination, Decreased activity tolerance, Decreased endurance, Decreased range of motion, Decreased strength, Hypomobility, Impaired perceived functional ability, Abnormal gait, Increased fascial restricitons, Impaired sensation, Decreased mobility, Postural dysfunction, Decreased balance, Difficulty walking, Impaired flexibility, Increased edema  Visit Diagnosis: Pain in left foot  Muscle weakness (generalized)  Stiffness of left foot, not elsewhere classified  Difficulty in walking, not elsewhere classified     Problem List Patient Active Problem List   Diagnosis Date Noted   Dermatitis 09/10/2020   NICM (nonischemic cardiomyopathy) ([place]) 08/22/2017   CHF (congestive heart failure) ([place]) 04/03/2017   DM (diabetes mellitus), type 2 ([place])    Essential hypertension    Hypercholesterolemia 11/10/2012    Everlean Alstrom. Graylon Good,  PT, DPT 12/01/20, 11:58 AM   [facility] [address], [place], [address] Phone: [phone]   Fax:  [phone]  Name: Erin Hayes MRN: [id number] Date of Birth: May 05, 1966

## 2020-12-05 ENCOUNTER — Telehealth: Payer: Self-pay | Admitting: Physical Therapy

## 2020-12-05 ENCOUNTER — Ambulatory Visit: Payer: Medicare Other | Attending: Podiatry | Admitting: Physical Therapy

## 2020-12-05 DIAGNOSIS — R202 Paresthesia of skin: Secondary | ICD-10-CM | POA: Insufficient documentation

## 2020-12-05 DIAGNOSIS — M6281 Muscle weakness (generalized): Secondary | ICD-10-CM | POA: Insufficient documentation

## 2020-12-05 DIAGNOSIS — R262 Difficulty in walking, not elsewhere classified: Secondary | ICD-10-CM | POA: Insufficient documentation

## 2020-12-05 DIAGNOSIS — M25675 Stiffness of left foot, not elsewhere classified: Secondary | ICD-10-CM | POA: Insufficient documentation

## 2020-12-05 DIAGNOSIS — M25512 Pain in left shoulder: Secondary | ICD-10-CM | POA: Insufficient documentation

## 2020-12-05 DIAGNOSIS — G8929 Other chronic pain: Secondary | ICD-10-CM | POA: Insufficient documentation

## 2020-12-05 DIAGNOSIS — M79672 Pain in left foot: Secondary | ICD-10-CM | POA: Insufficient documentation

## 2020-12-05 DIAGNOSIS — M542 Cervicalgia: Secondary | ICD-10-CM | POA: Insufficient documentation

## 2020-12-05 NOTE — Telephone Encounter (Signed)
Called patient when she did not show up to her 9:45 appointment this morning. Patient answered and said she totally forgot. She was able to reschedule to tomorrow at [place]. Graylon Good, PT, DPT 12/05/20, 10:25 AM

## 2020-12-07 ENCOUNTER — Ambulatory Visit: Payer: Medicare Other | Admitting: Physical Therapy

## 2020-12-07 ENCOUNTER — Encounter: Payer: Self-pay | Admitting: Physical Therapy

## 2020-12-07 VITALS — BP 142/110 | HR 84

## 2020-12-07 DIAGNOSIS — M79672 Pain in left foot: Secondary | ICD-10-CM | POA: Diagnosis present

## 2020-12-07 DIAGNOSIS — M25512 Pain in left shoulder: Secondary | ICD-10-CM

## 2020-12-07 DIAGNOSIS — M542 Cervicalgia: Secondary | ICD-10-CM | POA: Diagnosis present

## 2020-12-07 DIAGNOSIS — M25675 Stiffness of left foot, not elsewhere classified: Secondary | ICD-10-CM

## 2020-12-07 DIAGNOSIS — M6281 Muscle weakness (generalized): Secondary | ICD-10-CM | POA: Diagnosis not present

## 2020-12-07 DIAGNOSIS — R262 Difficulty in walking, not elsewhere classified: Secondary | ICD-10-CM | POA: Diagnosis present

## 2020-12-07 DIAGNOSIS — G8929 Other chronic pain: Secondary | ICD-10-CM

## 2020-12-07 DIAGNOSIS — R202 Paresthesia of skin: Secondary | ICD-10-CM

## 2020-12-07 NOTE — Therapy (Signed)
[facility] [address], [place], [address] Phone: [phone]   Fax:  [phone]  Physical Therapy Treatment  Patient Details  Name: Erin Hayes MRN: [id number] Date of Birth: [birth year] Referring Provider (PT): Felipa Furnace, [place]   Encounter Date: 12/07/2020   PT End of Session - 12/07/20 1136     Visit Number 9    Number of Visits 24    Date for PT Re-Evaluation 02/15/21    Authorization Type [place] reporting period from 08/24/2020    Authorization Time Period 27 PT/OT/ST visit per calendar year    Authorization - Visit Number 23    Authorization - Number of Visits 27    Progress Note Due on Visit 10    PT Start Time 1030    PT Stop Time 1108    PT Time Calculation (min) 38 min    Equipment Utilized During Treatment Gait belt    Activity Tolerance Patient tolerated treatment well    Behavior During Therapy WFL for tasks assessed/performed             Past Medical History:  Diagnosis Date   Carpal tunnel syndrome of right wrist    Chronic combined systolic (congestive) and diastolic (congestive) heart failure ([place])    a. 03/2017 Echo: EF 20-25%, diff HK, Gr2 DD; b. 09/2017 Echo: EF 20-25%, diff HK, Gr2 DD.   Diabetes ([place])    GERD (gastroesophageal reflux disease)    HLD (hyperlipidemia)    Hypertension    NICM (nonischemic cardiomyopathy) ([place])    a. 03/2017 Echo: EF 20-25%, diff HK, Gr2 DD, mild to mod MR, nl RV fxn;  b. 03/2017 Cath: mild nonobs dzs, EF 25%; c. 09/2017 Echo: EF 20-25%, diff HK. Gr2 DD. Mild MR. Mildly reduced RV fxn.   Pleural effusion, left    a. 03/2017 s/p thoracentesis-->300 ml withdrawn--transudative.   Pneumonia 02/19/2017    Past Surgical History:  Procedure Laterality Date   COLONOSCOPY WITH PROPOFOL N/A 01/21/2017   Procedure: COLONOSCOPY WITH PROPOFOL;  Surgeon: Jonathon Bellows, MD;  Location: [facility] ENDOSCOPY;  Service: Gastroenterology;   Laterality: N/A;   ESOPHAGOGASTRODUODENOSCOPY (EGD) WITH PROPOFOL N/A 01/21/2017   Procedure: ESOPHAGOGASTRODUODENOSCOPY (EGD) WITH PROPOFOL;  Surgeon: Jonathon Bellows, MD;  Location: [facility] ENDOSCOPY;  Service: Gastroenterology;  Laterality: N/A;   FLEXIBLE SIGMOIDOSCOPY N/A 11/04/2016   Procedure: FLEXIBLE SIGMOIDOSCOPY;  Surgeon: Wilford Corner, MD;  Location: [facility] ENDOSCOPY;  Service: Endoscopy;  Laterality: N/A;   LEFT HEART CATH AND CORONARY ANGIOGRAPHY N/A 04/05/2017   Procedure: LEFT HEART CATH AND CORONARY ANGIOGRAPHY;  Surgeon: Wellington Hampshire, MD;  Location: [facility] CV LAB;  Service: Cardiovascular;  Laterality: N/A;   LITHOTRIPSY      Subjective Assessment - 12/07/20 1133     Subjective Patient reports that she has been good since last appointment, Patient denies ankle pain today. Patient states HEP compliance and believes that HEP exercises are still challenging. Patient reports taking medications as usual this morning.    Patient is accompained by: Family member   daughter and granddaughter   Pertinent History Patient is a 55 y.o. female who presents to outpatient physical therapy with a referral for medical diagnosis closed fracture of head of metatarsal, left. This patient's chief complaints consist of left foot pain, leading to the following functional deficits:  difficulty with walking, using stairs, getting up and down stairs, prolonged standing (sits in a w/c to do a lot  of tasks now), completing tasks without fear of falling. Relevant past medical history and comorbidities include carpal tunnel syndrome (maybe undiagnosed left), CHF, Diabetes, hypertension, osteoporosis, nonischemic cardiomyopathy, hx of pleural effusion left lung (2018), hx left heart cath and coronary angiography (2018), L foot fracture (fall Oct 2020), left shoulder pain. Patient denies hx of cancer, stroke, seizures, unexplained weight loss, changes in bowel or bladder problems, new onset stumbling or dropping  things.    Limitations House hold activities;Lifting;Standing;Walking   Functional Limitations: difficulty with walking, using stairs, getting up and down stairs, prolonged standing (sits in a w/c to do a lot of tasks now), completing tasks without fear of falling.   Diagnostic tests L foot Radiograph report 08/16/2020: "3 views of skeletally mature adult left foot: No fractures noted.  Severe osteopenia noted.  The fracture may have consolidated."    Patient Stated Goals "to be able to walk longer without having to use wheelchair"    Currently in Pain? No/denies    Pain Score 0-No pain    Pain Onset More than a month ago              Vitals:   12/07/20 1127  BP: (!) 142/110  Pulse: 84  SpO2: 98%    OBJECTIVE:   VITALS (beginning): O2: 98% on RA HR: 84 bpm BP: 142/110 mmHg    VITALS (after performing standing exercises): O2 94% on RA HR 102 bpm 160/110 mmHG   Vitals taken at R arm with adult sized manual cuff  OBSERVATION:  - L ankle edema noted.   TREATMENT:   Shoes doffed:  Therapeutic exercise: to centralize symptoms and improve ROM, strength, muscular endurance, and activity tolerance required for successful completion of functional activities.  -Vitals measurements to assess safety of interventions (see above) - pursed lip breathing 1x15  - Standing B heel raises off edge of a2z pad, 2x20 with B UE support with 2 finger support - standing B toe raises with heels on a2z pad, 2x20 with U UE support with 2 finger support - tandem walking on 5 foot aeormat, 1x2 reps forwards/backwards with CGA and occasionally stepping off - side stepping on 5 foot aeromat attempting to use only forefeet, 2x2 reps each direction with CGA.  - standing step through/step back from floor 2x10 with L UE support on treadmill. Performed with R leg stepping forward and emphasizing L ankle push off. Step back performed with R leg stepping backwards with emphasizing L ankle dorsiflexion.  -  ambulation 200 feet with emphasis on L ankle push off.  - standing 3 way R ankle reach using slider with L UE support on treadmill, 1x10 - standing R ankle half circle using slider with two finger support on treadmill. 2x10 each direction. Performed clockwise and counter clockwise.   - standing lateral R ankle reach using slider and one finger support on treadmill 2x10  - standing toe taps with two finger support on treadmill for balance, 1 x10 with singular tap, 2x10 with double tap.  - standing arch raise on a2z pad with green band wrapped around B ankles, 1x20 each side with  B UE support.   Pt required multimodal cuing for proper technique and to facilitate improved neuromuscular control, strength, range of motion, and functional ability resulting in improved performance and form.    PT Education - 12/07/20 1135     Education Details Exercise purpose/form.    Person(s) Educated Patient    Methods Explanation;Demonstration;Tactile cues;Verbal cues  Comprehension Verbalized understanding;Returned demonstration;Tactile cues required;Verbal cues required              PT Short Term Goals - 09/07/20 1024       PT SHORT TERM GOAL #1   Title Be independent with initial home exercise program for self-management of symptoms.    Baseline initial HEP provided at IE (08/24/2020);    Time 2    Period Weeks    Status Achieved    Target Date 09/07/20               PT Long Term Goals - 11/24/20 1712       PT LONG TERM GOAL #1   Title Be independent with a long-term home exercise program for self-management of symptoms.    Baseline initial HEP provided at IE (08/24/2020);    Time 12    Period Weeks    Status Partially Met   TARGET DATE FOR ALL LONG TERM GOALS: 11/16/2020; updated to 02/15/2021     PT LONG TERM GOAL #2   Title Demonstrate improved FOTO score to 59 by visit #13 to demonstrate improvement in overall condition and self-reported functional ability.    Baseline 43  (08/24/2020); 57 (10/26/2020);    Time 12    Period Weeks    Status Partially Met      PT LONG TERM GOAL #3   Title Patient will ambulate equal or greater than 600 feet during 6 minute walk test without post-op shoe or assistive device to improve patient's household and community mobility.    Baseline ambulated only ~ 20 feet without post-op shoe (08/24/2020);    Time 12    Period Weeks    Status Deferred      PT LONG TERM GOAL #4   Title Improve left ankle strength to 4+/5 with no increase in pain for improved ability to allow patient to complete valued functional tasks such as reaching walking and community activities.    Baseline limited and painfull - see objective exam (08/24/2020);    Time 12    Period Weeks    Status Deferred      PT LONG TERM GOAL #5   Title Complete community, work and/or recreational activities without limitation due to current condition.    Baseline Functional Limitations: difficulty with walking, using stairs, getting up and down stairs, prolonged standing (sits in a w/c to do a lot of tasks now), completing tasks without fear of falling (08/24/2020);    Time 12    Period Weeks    Status Partially Met      PT LONG TERM GOAL #6   Title Patient will demonstrate L ankle/toe A/PROM equal or greater than R ankle/toe A/PROM to improve walking mechanics to improve household and community ambulation.    Baseline limited and painful - see objective exam (08/24/2020);    Time 12    Period Weeks    Status Deferred                   Plan - 12/07/20 1139     Clinical Impression Statement Patient tolerated treatment well with no complaints of pain. Patient L ankle swelling and strength limits patient ankle mobility and impacts patient gait pattern. Patient demonstrated improvement in L ankle push off after gait work during [place]. However, patient still has difficulty weight bearing on L LE and L ankle moves into valgus while ambulating. Patient's blood  pressure noted to be elevated so monitored during session and  advised patient to follow up with PCP if continues to be elevated. Patient will continue to benefit from skilled physical therapy to address impairments and maximize functional mobility.    Personal Factors and Comorbidities Comorbidity 3+;Past/Current Experience;Fitness;Time since onset of injury/illness/exacerbation    Comorbidities carpal tunnel syndrome (maybe undiagnosed left), CHF, Diabetes, hypertension, osteoporosis, nonischemic cardiomyopathy, hx of pleural effusion left lung (2018), hx left heart cath and coronary angiography (2018), L foot fracture (fall Oct 2020), left shoulder pain.    Examination-Activity Limitations Caring for Others;Lift;Stand;Locomotion Level;Carry;Squat;Stairs    Examination-Participation Restrictions Cleaning;Laundry;Meal Prep;Interpersonal Relationship;Community Activity;Yard Work;Shop    Stability/Clinical Decision Making Evolving/Moderate complexity    Rehab Potential Good    PT Frequency 2x / week    PT Duration 12 weeks    PT Treatment/Interventions ADLs/Self Care Home Management;Aquatic Therapy;Biofeedback;Cryotherapy;Electrical Stimulation;Moist Heat;DME Instruction;Gait training;Stair training;Functional mobility training;Therapeutic activities;Therapeutic exercise;Balance training;Neuromuscular re-education;Patient/family education;Orthotic Fit/Training;Manual techniques;Energy conservation;Spinal Manipulations;Joint Manipulations;Taping    PT Next Visit Plan ankle/foot strengthening as tolerated, wean from orthopedic shoe as tolerated. Balance training    PT Home Exercise Plan medbridge  Access Code: [email or web address]     [email or web address] SS [id number] Home Exercise Program [facility])    Consulted and Agree with Plan of Care Patient             Patient will benefit from skilled therapeutic intervention in order to improve the following deficits and impairments:  Improper body mechanics, Pain, Decreased  coordination, Decreased activity tolerance, Decreased endurance, Decreased range of motion, Decreased strength, Hypomobility, Impaired perceived functional ability, Abnormal gait, Increased fascial restricitons, Impaired sensation, Decreased mobility, Postural dysfunction, Decreased balance, Difficulty walking, Impaired flexibility, Increased edema  Visit Diagnosis: Muscle weakness (generalized)  Chronic left shoulder pain  Pain in left foot  Stiffness of left foot, not elsewhere classified  Cervicalgia  Difficulty in walking, not elsewhere classified  Paresthesia of skin     Problem List Patient Active Problem List   Diagnosis Date Noted   Dermatitis 09/10/2020   NICM (nonischemic cardiomyopathy) ([place]) 08/22/2017   CHF (congestive heart failure) ([place]) 04/03/2017   DM (diabetes mellitus), type 2 ([place])    Essential hypertension    Hypercholesterolemia 11/10/2012    Sherryll Burger, SPT  Student physical therapist under direct supervision of licensed physical therapists during the entirety of the session.   Everlean Alstrom. Graylon Good, PT, DPT 12/07/20, 2:14 PM   [facility] [address], [place], [address] Phone: [phone]   Fax:  [phone]  Name: Erin Hayes MRN: [id number] Date of Birth: 12/08/1965

## 2020-12-13 ENCOUNTER — Ambulatory Visit: Payer: Medicare Other | Admitting: Physical Therapy

## 2020-12-15 ENCOUNTER — Telehealth: Payer: Self-pay | Admitting: Physical Therapy

## 2020-12-15 NOTE — Telephone Encounter (Signed)
Patient missed appointment on Tuesday August 9th at 10:30 am. She states that she called the front desk to let them know. Patient states that she missed appointment because she got confused about which day her appointment was on. Patient confirms that she would like to continue PT and can come to her next visit on Wednesday 8/17 at 10:30.  Sherryll Burger, SPT  Student physical therapist under direct supervision of licensed physical therapists during the entirety of the session.   Everlean Alstrom. Graylon Good, PT, DPT 12/15/20, 1:33 PM

## 2020-12-21 ENCOUNTER — Telehealth: Payer: Self-pay | Admitting: Cardiovascular Disease

## 2020-12-21 ENCOUNTER — Ambulatory Visit: Payer: Medicare Other | Admitting: Physical Therapy

## 2020-12-21 NOTE — Telephone Encounter (Signed)
Please call to discuss handicap application. Patient states her PCP asked her to contact DR. Golllan to fill this out.

## 2020-12-21 NOTE — Telephone Encounter (Signed)
Dr. Rockey Situ reviewed qualification for handicap parking pass, pt does not meet criteria from a cardiac standpoint.   However, did grant 6 months disability parking pass (after chart review) d/t current rehab for muscle weakness, having to use assistance such as wheelchair to get around.   After 6 months pt will need her PCP or ortho (rehab) to sign for additional time for the disability parking pass.   Was able to reach pt regarding their request for a [place] disability parking pass. Advised based on their cardiac history, unable to have Dr. Rockey Situ sign application as they do not meet the [employer] requirement to uphold a parking place card for cardiac reason, Dr. Rockey Situ will sign for ortho and unable to walk without assistance of a device.  Advised falseitizing a disability parking pass is illegal in [place] and Dr. Rockey Situ or other provider can be penalize if audited.   Noted by [place] Division of Motor Vehicles it requires A person to have a cardiac condition to the extent that the person's functional limitations are classified in severity as Class III or Class IV according to standards set by the American Heart Association Cannot walk 200 feet without stopping to rest D/t shob associated with CHF Or other cardiac related condition  There are other criterias that a person can meet outside the cardiologist recommendation and a PCP, pulmonologist, orthopedics, or other speciality can sign off on if their medical condition falls under the [place] requirements such as  Mrs. Pasley meets the bolded criteria below at this time.   Cannot walk without the use of, or assistance from, a brace, cane, crutch, another person, prosthetic device, wheelchair, or other assistive device Is restricted by lung disease to such an extent that the person's forced (respiratory) expiratory volume of one second, when measured by spirometry, is  less than one liter, or the arterial oxygen tension is less than 60 mm/hg on room air at rest Uses  portable oxygen Is severely limited in their ability to walk due to an arthritic, neurological, or orthopedic condition Is totally blind or whose vision with glasses is so defective as to prevent the performance of ordinary activity for which eyesight is essential, as certified by a  licensed ophthalmologist, optometrist, or the Division of Services for the Blind  Pt verbalized understanding, thankful for the return call and explanation. Will come by the office to pick up her disability parking form, will need to fill out her portion and send in to [facility] with her payment. After 6 months, will need another provider other then cardiologist to sign for an extension.

## 2020-12-27 ENCOUNTER — Ambulatory Visit: Payer: Medicare Other | Admitting: Physical Therapy

## 2021-01-03 ENCOUNTER — Encounter: Payer: Medicare Other | Admitting: Physical Therapy

## 2021-01-24 ENCOUNTER — Ambulatory Visit (INDEPENDENT_AMBULATORY_CARE_PROVIDER_SITE_OTHER): Payer: Medicare Other

## 2021-01-24 ENCOUNTER — Other Ambulatory Visit: Payer: Self-pay

## 2021-01-24 DIAGNOSIS — Z111 Encounter for screening for respiratory tuberculosis: Secondary | ICD-10-CM | POA: Diagnosis not present

## 2021-01-26 ENCOUNTER — Other Ambulatory Visit: Payer: Self-pay

## 2021-01-26 ENCOUNTER — Ambulatory Visit: Payer: Medicare Other

## 2021-01-26 DIAGNOSIS — Z111 Encounter for screening for respiratory tuberculosis: Secondary | ICD-10-CM

## 2021-01-26 LAB — TB SKIN TEST
Induration: 0 mm
TB Skin Test: NEGATIVE

## 2021-01-31 ENCOUNTER — Other Ambulatory Visit: Payer: Self-pay

## 2021-01-31 ENCOUNTER — Ambulatory Visit (INDEPENDENT_AMBULATORY_CARE_PROVIDER_SITE_OTHER): Payer: Medicare Other | Admitting: Dermatology

## 2021-01-31 DIAGNOSIS — L2081 Atopic neurodermatitis: Secondary | ICD-10-CM

## 2021-01-31 MED ORDER — EUCRISA 2 % EX OINT: TOPICAL_OINTMENT | CUTANEOUS | 3 refills | Status: DC

## 2021-01-31 NOTE — Patient Instructions (Signed)

## 2021-02-01 NOTE — Progress Notes (Signed)
   Follow-Up Visit   Subjective  Erin Hayes is a 55 y.o. female who presents for the following: Follow-up (2 month follow up for atopic neurodermatitis - TMC 0.1% bid and she is much better but still itches sometimes).  The following portions of the chart were reviewed this encounter and updated as appropriate:   Tobacco  Allergies  Meds  Problems  Med Hx  Surg Hx  Fam Hx     Review of Systems:  No other skin or systemic complaints except as noted in HPI or Assessment and Plan.  Objective  Well appearing patient in no apparent distress; mood and affect are within normal limits.  A focused examination was performed including the arms and legs. Relevant physical exam findings are noted in the Assessment and Plan.  B/L arms and legs Macular hyper and hypo pigmentation with some scarring.    Assessment & Plan  Atopic neurodermatitis B/L arms and legs  Improved per patient -but not to goal  Atopic dermatitis (eczema) is a chronic, relapsing, pruritic condition that can significantly affect quality of life. It is often associated with allergic rhinitis and/or asthma and can require treatment with topical medications, phototherapy, or in severe cases a biologic medication called Dupixent in children and adults.   Continue TMC 0.1% QD on the weekends.  Start Eucrisa 2% ointment BID.   Topical steroids (such as triamcinolone, fluocinolone, fluocinonide, mometasone, clobetasol, halobetasol, betamethasone, hydrocortisone) can cause thinning and lightening of the skin if they are used for too long in the same area. Your physician has selected the right strength medicine for your problem and area affected on the body. Please use your medication only as directed by your physician to prevent side effects.   Crisaborole (EUCRISA) 2 % OINT - B/L arms and legs Apply to aa's BID.  Related Medications triamcinolone cream (KENALOG) 0.1 % Apply to aa's BID x 2 weeks. Then decrease use to  BID 5d/wk.  Return in about 2 months (around 04/02/2021) for atopic neurodermatitis follow up .  Luther Redo, CMA, am acting as scribe for Sarina Ser, MD . Documentation: I have reviewed the above documentation for accuracy and completeness, and I agree with the above.  Sarina Ser, MD

## 2021-02-02 ENCOUNTER — Encounter: Payer: Self-pay | Admitting: Dermatology

## 2021-02-09 ENCOUNTER — Telehealth: Payer: Self-pay

## 2021-02-09 NOTE — Telephone Encounter (Signed)
Patient has [employer]. Erin Hayes will not be covered until Erin Hayes is tried and failed. Okay to make switch?

## 2021-02-14 MED ORDER — TACROLIMUS 0.1 % EX OINT: TOPICAL_OINTMENT | Freq: Two times a day (BID) | CUTANEOUS | 2 refills | Status: DC

## 2021-02-14 NOTE — Telephone Encounter (Signed)
Tacrolimus sent in and patient advised of medication change.

## 2021-02-27 ENCOUNTER — Ambulatory Visit (INDEPENDENT_AMBULATORY_CARE_PROVIDER_SITE_OTHER): Payer: Medicare Other | Admitting: Podiatry

## 2021-02-27 ENCOUNTER — Ambulatory Visit (INDEPENDENT_AMBULATORY_CARE_PROVIDER_SITE_OTHER): Payer: Medicare Other

## 2021-02-27 ENCOUNTER — Other Ambulatory Visit: Payer: Self-pay

## 2021-02-27 ENCOUNTER — Encounter: Payer: Self-pay | Admitting: Podiatry

## 2021-02-27 DIAGNOSIS — M79675 Pain in left toe(s): Secondary | ICD-10-CM | POA: Diagnosis not present

## 2021-02-27 DIAGNOSIS — B351 Tinea unguium: Secondary | ICD-10-CM | POA: Diagnosis not present

## 2021-02-27 DIAGNOSIS — E1165 Type 2 diabetes mellitus with hyperglycemia: Secondary | ICD-10-CM

## 2021-02-27 DIAGNOSIS — M79674 Pain in right toe(s): Secondary | ICD-10-CM

## 2021-02-27 DIAGNOSIS — S9032XA Contusion of left foot, initial encounter: Secondary | ICD-10-CM

## 2021-02-27 DIAGNOSIS — S92302A Fracture of unspecified metatarsal bone(s), left foot, initial encounter for closed fracture: Secondary | ICD-10-CM | POA: Diagnosis not present

## 2021-02-27 DIAGNOSIS — S9030XA Contusion of unspecified foot, initial encounter: Secondary | ICD-10-CM | POA: Insufficient documentation

## 2021-02-27 NOTE — Progress Notes (Signed)
This patient returns to my office for at risk foot care.  This patient requires this care by a professional since this patient will be at risk due to having diabetes with angiopathy.  This patient is unable to cut nails herself since the patient cannot reach her nails.These nails are painful walking and wearing shoes.  This patient presents for at risk foot care today.  She also says she fell twice last week and her left foot became very swollen and painful.  She sought no treatment until today.  She requests her left foot be examined.  General Appearance  Alert, conversant and in no acute stress.  Vascular  Dorsalis pedis and posterior tibial  pulses are weakly  palpable  bilaterally.  Capillary return is within normal limits  bilaterally. Temperature is within normal limits  bilaterally.  Neurologic  Senn-Weinstein monofilament wire test within normal limits  bilaterally. Muscle power within normal limits bilaterally.  Nails Thick disfigured discolored nails with subungual debris  from hallux to fifth toes bilaterally. No evidence of bacterial infection or drainage bilaterally.  Orthopedic  No limitations of motion  feet .  No crepitus or effusions noted.  No bony pathology or digital deformities noted. Swelling over left forefoot dorsally.  Skin  normotropic skin with no porokeratosis noted bilaterally.  No signs of infections or ulcers noted.     Onychomycosis  Pain in right toes  Pain in left toes  Consent was obtained for treatment procedures.   Mechanical debridement of nails 1-5  bilaterally performed with a nail nipper.  Filed with dremel without incident. Xrays were taken left foot.  Discussed condition with this patient.  No new fractures noted on the xray just the old fractures.  Since the left foot is just swollen and not warm I will treat this as bone contusion with cam walker.  RTC prn for left foot pain.   Return office visit    3 months for preventative foot care treatment.                  Told patient to return for periodic foot care and evaluation due to potential at risk complications.   Gardiner Barefoot DPM

## 2021-04-05 ENCOUNTER — Ambulatory Visit: Payer: Medicare Other | Admitting: Dermatology

## 2021-05-11 LAB — HM DIABETES EYE EXAM

## 2021-05-15 ENCOUNTER — Encounter: Payer: Self-pay | Admitting: Internal Medicine

## 2021-05-15 ENCOUNTER — Other Ambulatory Visit: Payer: Self-pay

## 2021-05-15 ENCOUNTER — Ambulatory Visit (INDEPENDENT_AMBULATORY_CARE_PROVIDER_SITE_OTHER): Payer: Medicare Other | Admitting: Internal Medicine

## 2021-05-15 VITALS — BP 140/90 | HR 95 | Temp 97.5°F | Resp 18 | Ht 59.0 in | Wt 118.4 lb

## 2021-05-15 DIAGNOSIS — J3089 Other allergic rhinitis: Secondary | ICD-10-CM | POA: Diagnosis not present

## 2021-05-15 MED ORDER — CETIRIZINE HCL 10 MG PO TABS: 10.0000 mg | ORAL_TABLET | Freq: Every day | ORAL | 11 refills | Status: DC

## 2021-05-15 MED ORDER — FLUTICASONE PROPIONATE 50 MCG/ACT NA SUSP: 2.0000 | Freq: Every day | NASAL | 3 refills | Status: DC

## 2021-05-15 NOTE — Progress Notes (Signed)
HPI  Pt presents to the clinic today with c/o headache, watery eyes, runny nose an d post nasal drip. This started 2 days ago. The headache is located in her temples. She denies dizziness or vision changes. She is blowing clear mucous out of her nose. She denies difficulty swallowing. She denies nasal congestion, ear pain, cough, shortness of breath, nausea, vomiting or diarrhea. She denies fever, chills or body aches. She has not tried anything OTC for her symptoms. She has not had sick contacts diagnosed with covid or flu but she does work in childcare.  Review of Systems      Past Medical History:  Diagnosis Date   Carpal tunnel syndrome of right wrist    Chronic combined systolic (congestive) and diastolic (congestive) heart failure ([place])    a. 03/2017 Echo: EF 20-25%, diff HK, Gr2 DD; b. 09/2017 Echo: EF 20-25%, diff HK, Gr2 DD.   Diabetes ([place])    GERD (gastroesophageal reflux disease)    HLD (hyperlipidemia)    Hypertension    NICM (nonischemic cardiomyopathy) ([place])    a. 03/2017 Echo: EF 20-25%, diff HK, Gr2 DD, mild to mod MR, nl RV fxn;  b. 03/2017 Cath: mild nonobs dzs, EF 25%; c. 09/2017 Echo: EF 20-25%, diff HK. Gr2 DD. Mild MR. Mildly reduced RV fxn.   Pleural effusion, left    a. 03/2017 s/p thoracentesis-->300 ml withdrawn--transudative.   Pneumonia 02/19/2017    Family History  Problem Relation Age of Onset   Lung cancer Mother    Hypertension Father    CAD Father        a. MI age 17   Heart disease Father    COPD Neg Hx    Diabetes Mellitus II Neg Hx     Social History   Socioeconomic History   Marital status: Single    Spouse name: Not on file   Number of children: 3   Years of education: 12   Highest education level: 12th grade  Occupational History   Occupation: Materials engineer  Tobacco Use   Smoking status: Never   Smokeless tobacco: Never  Vaping Use   Vaping Use: Never used  Substance and Sexual Activity   Alcohol use: No   Drug use: No   Sexual  activity: Never  Other Topics Concern   Not on file  Social History Narrative   Not on file   Social Determinants of Health   Financial Resource Strain: Not on file  Food Insecurity: Not on file  Transportation Needs: Not on file  Physical Activity: Not on file  Stress: Not on file  Social Connections: Not on file  Intimate Partner Violence: Not on file    No Known Allergies   Constitutional: Positive headache. Denies fatigue, fever or abrupt weight changes.  HEENT:  Positive runny nose,  sore throat. Denies eye redness, eye pain, pressure behind the eyes, facial pain, nasal congestion, ear pain, ringing in the ears, wax buildup,  or bloody nose. Respiratory: Denies cough, difficulty breathing or shortness of breath.  Cardiovascular: Denies chest pain, chest tightness, palpitations or swelling in the hands or feet.   No other specific complaints in a complete review of systems (except as listed in HPI above).  Objective:  BP 140/90    Pulse 95    Temp (!) 97.5 F (36.4 C) (Temporal)    Resp 18    Ht 4\' 11"  (1.499 m)    Wt 118 lb 6.4 oz (53.7 kg)  SpO2 100%    BMI 23.91 kg/m   Wt Readings from Last 3 Encounters:  09/09/20 119 lb (54 kg)  05/20/20 116 lb (52.6 kg)  05/18/20 114 lb 9.6 oz (52 kg)     General: Appears her stated age, well developed, well nourished in NAD. HEENT: Head: normal shape and size, no sinus tenderness noted; Eyes: sclera white, no icterus, conjunctiva pink; Throat/Mouth: + PND. Teeth present, mucosa erythematous and moist, no exudate noted, no lesions or ulcerations noted.  Neck: No cervical lymphadenopathy.  Cardiovascular: Normal rate and rhythm. S1,S2 noted.  No murmur, rubs or gallops noted.  Pulmonary/Chest: Normal effort and positive vesicular breath sounds. No respiratory distress. No wheezes, rales or ronchi noted.       Assessment & Plan:   Allergic Rhinitis  No indication for covid or flu testing Get some rest and drink plenty of  water Do salt water gargles for the sore throat eRx for Zyrtec 10 mg at bedtime x 1 week eRx for Flonase 1 spray daily x 1 week  RTC as needed or if symptoms persist.   Webb Silversmith, NP This visit occurred during the SARS-CoV-2 public health emergency.  Safety protocols were in place, including screening questions prior to the visit, additional usage of staff PPE, and extensive cleaning of exam room while observing appropriate contact time as indicated for disinfecting solutions.

## 2021-06-06 ENCOUNTER — Encounter: Payer: Self-pay | Admitting: Internal Medicine

## 2021-06-08 ENCOUNTER — Ambulatory Visit: Payer: Medicare Other | Admitting: Podiatry

## 2021-06-18 ENCOUNTER — Encounter: Payer: Self-pay | Admitting: *Deleted

## 2021-06-18 ENCOUNTER — Emergency Department: Payer: Medicare Other

## 2021-06-18 ENCOUNTER — Other Ambulatory Visit: Payer: Self-pay

## 2021-06-18 ENCOUNTER — Inpatient Hospital Stay
Admission: EM | Admit: 2021-06-18 | Discharge: 2021-06-21 | DRG: 481 | Disposition: A | Discharge status: Skilled Nursing Facility | Payer: Medicare Other | Attending: Internal Medicine | Admitting: Internal Medicine

## 2021-06-18 DIAGNOSIS — Z8701 Personal history of pneumonia (recurrent): Secondary | ICD-10-CM | POA: Diagnosis not present

## 2021-06-18 DIAGNOSIS — I5022 Chronic systolic (congestive) heart failure: Secondary | ICD-10-CM | POA: Diagnosis not present

## 2021-06-18 DIAGNOSIS — W010XXA Fall on same level from slipping, tripping and stumbling without subsequent striking against object, initial encounter: Secondary | ICD-10-CM | POA: Diagnosis present

## 2021-06-18 DIAGNOSIS — E1169 Type 2 diabetes mellitus with other specified complication: Secondary | ICD-10-CM | POA: Diagnosis not present

## 2021-06-18 DIAGNOSIS — E785 Hyperlipidemia, unspecified: Secondary | ICD-10-CM

## 2021-06-18 DIAGNOSIS — Z7982 Long term (current) use of aspirin: Secondary | ICD-10-CM | POA: Diagnosis not present

## 2021-06-18 DIAGNOSIS — I10 Essential (primary) hypertension: Secondary | ICD-10-CM | POA: Diagnosis present

## 2021-06-18 DIAGNOSIS — E1165 Type 2 diabetes mellitus with hyperglycemia: Secondary | ICD-10-CM | POA: Diagnosis present

## 2021-06-18 DIAGNOSIS — I110 Hypertensive heart disease with heart failure: Secondary | ICD-10-CM | POA: Diagnosis present

## 2021-06-18 DIAGNOSIS — E119 Type 2 diabetes mellitus without complications: Secondary | ICD-10-CM | POA: Diagnosis not present

## 2021-06-18 DIAGNOSIS — W19XXXA Unspecified fall, initial encounter: Secondary | ICD-10-CM | POA: Diagnosis not present

## 2021-06-18 DIAGNOSIS — E7800 Pure hypercholesterolemia, unspecified: Secondary | ICD-10-CM | POA: Diagnosis present

## 2021-06-18 DIAGNOSIS — Z794 Long term (current) use of insulin: Secondary | ICD-10-CM

## 2021-06-18 DIAGNOSIS — I5042 Chronic combined systolic (congestive) and diastolic (congestive) heart failure: Secondary | ICD-10-CM | POA: Diagnosis present

## 2021-06-18 DIAGNOSIS — M79632 Pain in left forearm: Secondary | ICD-10-CM | POA: Diagnosis present

## 2021-06-18 DIAGNOSIS — K219 Gastro-esophageal reflux disease without esophagitis: Secondary | ICD-10-CM | POA: Diagnosis present

## 2021-06-18 DIAGNOSIS — D62 Acute posthemorrhagic anemia: Secondary | ICD-10-CM

## 2021-06-18 DIAGNOSIS — S72002P Fracture of unspecified part of neck of left femur, subsequent encounter for closed fracture with malunion: Secondary | ICD-10-CM | POA: Diagnosis not present

## 2021-06-18 DIAGNOSIS — Z8249 Family history of ischemic heart disease and other diseases of the circulatory system: Secondary | ICD-10-CM | POA: Diagnosis not present

## 2021-06-18 DIAGNOSIS — K08109 Complete loss of teeth, unspecified cause, unspecified class: Secondary | ICD-10-CM | POA: Diagnosis present

## 2021-06-18 DIAGNOSIS — Z79899 Other long term (current) drug therapy: Secondary | ICD-10-CM | POA: Diagnosis not present

## 2021-06-18 DIAGNOSIS — I160 Hypertensive urgency: Secondary | ICD-10-CM | POA: Diagnosis present

## 2021-06-18 DIAGNOSIS — I428 Other cardiomyopathies: Secondary | ICD-10-CM | POA: Diagnosis present

## 2021-06-18 DIAGNOSIS — Z419 Encounter for procedure for purposes other than remedying health state, unspecified: Secondary | ICD-10-CM

## 2021-06-18 DIAGNOSIS — Z20822 Contact with and (suspected) exposure to covid-19: Secondary | ICD-10-CM | POA: Diagnosis present

## 2021-06-18 DIAGNOSIS — S72002A Fracture of unspecified part of neck of left femur, initial encounter for closed fracture: Principal | ICD-10-CM | POA: Diagnosis present

## 2021-06-18 DIAGNOSIS — I509 Heart failure, unspecified: Secondary | ICD-10-CM

## 2021-06-18 DIAGNOSIS — Z791 Long term (current) use of non-steroidal anti-inflammatories (NSAID): Secondary | ICD-10-CM | POA: Diagnosis not present

## 2021-06-18 LAB — CBC WITH DIFFERENTIAL/PLATELET
Abs Immature Granulocytes: 0.08 10*3/uL — ABNORMAL HIGH (ref 0.00–0.07)
Basophils Absolute: 0.1 10*3/uL (ref 0.0–0.1)
Basophils Relative: 1 %
Eosinophils Absolute: 0.1 10*3/uL (ref 0.0–0.5)
Eosinophils Relative: 1 %
HCT: 37.8 % (ref 36.0–46.0)
Hemoglobin: 11.9 g/dL — ABNORMAL LOW (ref 12.0–15.0)
Immature Granulocytes: 1 %
Lymphocytes Relative: 22 %
Lymphs Abs: 1.7 10*3/uL (ref 0.7–4.0)
MCH: 27.3 pg (ref 26.0–34.0)
MCHC: 31.5 g/dL (ref 30.0–36.0)
MCV: 86.7 fL (ref 80.0–100.0)
Monocytes Absolute: 0.6 10*3/uL (ref 0.1–1.0)
Monocytes Relative: 7 %
Neutro Abs: 5.3 10*3/uL (ref 1.7–7.7)
Neutrophils Relative %: 68 %
Platelets: 338 10*3/uL (ref 150–400)
RBC: 4.36 MIL/uL (ref 3.87–5.11)
RDW: 13.2 % (ref 11.5–15.5)
WBC: 7.8 10*3/uL (ref 4.0–10.5)
nRBC: 0 % (ref 0.0–0.2)

## 2021-06-18 LAB — COMPREHENSIVE METABOLIC PANEL
ALT: 16 U/L (ref 0–44)
AST: 19 U/L (ref 15–41)
Albumin: 3.8 g/dL (ref 3.5–5.0)
Alkaline Phosphatase: 97 U/L (ref 38–126)
Anion gap: 8 (ref 5–15)
BUN: 20 mg/dL (ref 6–20)
CO2: 26 mmol/L (ref 22–32)
Calcium: 8.7 mg/dL — ABNORMAL LOW (ref 8.9–10.3)
Chloride: 102 mmol/L (ref 98–111)
Creatinine, Ser: 0.94 mg/dL (ref 0.44–1.00)
GFR, Estimated: >60 mL/min (ref >60)
Glucose, Bld: 122 mg/dL — ABNORMAL HIGH (ref 70–99)
Potassium: 3.5 mmol/L (ref 3.5–5.1)
Sodium: 136 mmol/L (ref 135–145)
Total Bilirubin: 0.8 mg/dL (ref 0.3–1.2)
Total Protein: 7 g/dL (ref 6.5–8.1)

## 2021-06-18 LAB — SURGICAL PCR SCREEN
MRSA, PCR: NEGATIVE
Staphylococcus aureus: NEGATIVE

## 2021-06-18 LAB — GLUCOSE, CAPILLARY: Glucose-Capillary: 151 mg/dL — ABNORMAL HIGH (ref 70–99)

## 2021-06-18 LAB — RESP PANEL BY RT-PCR (FLU A&B, COVID) ARPGX2
Influenza A by PCR: NEGATIVE
Influenza B by PCR: NEGATIVE
SARS Coronavirus 2 by RT PCR: NEGATIVE

## 2021-06-18 MED ORDER — EXENATIDE ER 2 MG/0.85ML ~~LOC~~ AUIJ: 2.0000 mg | AUTO-INJECTOR | SUBCUTANEOUS | Status: DC

## 2021-06-18 MED ORDER — TACROLIMUS 0.1 % EX OINT: TOPICAL_OINTMENT | Freq: Two times a day (BID) | CUTANEOUS | Status: DC

## 2021-06-18 MED ORDER — SPIRONOLACTONE 25 MG PO TABS
25.0000 mg | ORAL_TABLET | Freq: Every day | ORAL | Status: DC
  Administered 2021-06-18 – 2021-06-22 (×4): 25 mg via ORAL
  Filled 2021-06-18 (×4): qty 1

## 2021-06-18 MED ORDER — CARVEDILOL 3.125 MG PO TABS
18.7500 mg | ORAL_TABLET | Freq: Two times a day (BID) | ORAL | Status: DC
  Administered 2021-06-18 – 2021-06-22 (×8): 18.75 mg via ORAL
  Filled 2021-06-18: qty 3
  Filled 2021-06-18: qty 2
  Filled 2021-06-18: qty 3
  Filled 2021-06-18 (×4): qty 2
  Filled 2021-06-18: qty 3

## 2021-06-18 MED ORDER — FENTANYL CITRATE PF 50 MCG/ML IJ SOSY: 12.5000 ug | PREFILLED_SYRINGE | INTRAMUSCULAR | Status: DC | PRN

## 2021-06-18 MED ORDER — TRAZODONE HCL 50 MG PO TABS
25.0000 mg | ORAL_TABLET | Freq: Every evening | ORAL | Status: DC | PRN
  Administered 2021-06-20: 25 mg via ORAL
  Filled 2021-06-18: qty 1

## 2021-06-18 MED ORDER — METOCLOPRAMIDE HCL 5 MG/ML IJ SOLN
10.0000 mg | Freq: Once | INTRAMUSCULAR | Status: AC
  Administered 2021-06-18: 10 mg via INTRAVENOUS
  Filled 2021-06-18: qty 2

## 2021-06-18 MED ORDER — LABETALOL HCL 5 MG/ML IV SOLN: 20.0000 mg | INTRAVENOUS | Status: DC | PRN

## 2021-06-18 MED ORDER — ROSUVASTATIN CALCIUM 10 MG PO TABS
20.0000 mg | ORAL_TABLET | Freq: Every day | ORAL | Status: DC
Start: 2021-06-18 — End: 2021-06-22
  Administered 2021-06-18 – 2021-06-22 (×4): 20 mg via ORAL
  Filled 2021-06-18: qty 1
  Filled 2021-06-18: qty 2
  Filled 2021-06-18: qty 1
  Filled 2021-06-18 (×2): qty 2

## 2021-06-18 MED ORDER — ALBUTEROL SULFATE (2.5 MG/3ML) 0.083% IN NEBU: 2.5000 mg | INHALATION_SOLUTION | RESPIRATORY_TRACT | Status: DC | PRN

## 2021-06-18 MED ORDER — MORPHINE SULFATE (PF) 2 MG/ML IV SOLN: 0.5000 mg | INTRAVENOUS | Status: DC | PRN

## 2021-06-18 MED ORDER — MUPIROCIN 2 % EX OINT
1.0000 "application " | TOPICAL_OINTMENT | Freq: Two times a day (BID) | CUTANEOUS | Status: DC
  Administered 2021-06-18 – 2021-06-21 (×4): 1 via NASAL
  Filled 2021-06-18: qty 22

## 2021-06-18 MED ORDER — LORATADINE 10 MG PO TABS
10.0000 mg | ORAL_TABLET | Freq: Every day | ORAL | Status: DC
  Administered 2021-06-18 – 2021-06-22 (×4): 10 mg via ORAL
  Filled 2021-06-18 (×4): qty 1

## 2021-06-18 MED ORDER — FUROSEMIDE 20 MG PO TABS
20.0000 mg | ORAL_TABLET | Freq: Every day | ORAL | Status: DC
  Administered 2021-06-18 – 2021-06-22 (×4): 20 mg via ORAL
  Filled 2021-06-18 (×4): qty 1

## 2021-06-18 MED ORDER — TRIAMCINOLONE ACETONIDE 0.1 % EX CREA
TOPICAL_CREAM | Freq: Two times a day (BID) | CUTANEOUS | Status: DC
  Filled 2021-06-18: qty 15

## 2021-06-18 MED ORDER — MORPHINE SULFATE (PF) 4 MG/ML IV SOLN
4.0000 mg | Freq: Once | INTRAVENOUS | Status: AC
  Administered 2021-06-18: 4 mg via INTRAVENOUS
  Filled 2021-06-18: qty 1

## 2021-06-18 MED ORDER — ONDANSETRON HCL 4 MG PO TABS: 4.0000 mg | ORAL_TABLET | Freq: Three times a day (TID) | ORAL | Status: DC | PRN

## 2021-06-18 MED ORDER — OXYCODONE HCL 5 MG PO TABS
5.0000 mg | ORAL_TABLET | ORAL | Status: DC | PRN
  Administered 2021-06-20: 5 mg via ORAL
  Administered 2021-06-21 – 2021-06-22 (×2): 10 mg via ORAL
  Filled 2021-06-18: qty 1
  Filled 2021-06-18 (×2): qty 2

## 2021-06-18 MED ORDER — FLUTICASONE PROPIONATE 50 MCG/ACT NA SUSP
2.0000 | Freq: Every day | NASAL | Status: DC
  Administered 2021-06-18: 22:00:00 2 via NASAL
  Filled 2021-06-18: qty 16

## 2021-06-18 MED ORDER — HYDROCODONE-ACETAMINOPHEN 5-325 MG PO TABS
1.0000 | ORAL_TABLET | Freq: Four times a day (QID) | ORAL | Status: DC | PRN
  Administered 2021-06-18 – 2021-06-21 (×2): 2 via ORAL
  Filled 2021-06-18 (×2): qty 2

## 2021-06-18 MED ORDER — INSULIN GLARGINE-YFGN 100 UNIT/ML ~~LOC~~ SOLN
14.0000 [IU] | Freq: Every day | SUBCUTANEOUS | Status: DC
  Administered 2021-06-18 – 2021-06-20 (×3): 14 [IU] via SUBCUTANEOUS
  Filled 2021-06-18 (×4): qty 0.14

## 2021-06-18 MED ORDER — SODIUM CHLORIDE 0.9 % IV SOLN: INTRAVENOUS | Status: DC

## 2021-06-18 MED ORDER — CEFAZOLIN SODIUM-DEXTROSE 1-4 GM/50ML-% IV SOLN
1.0000 g | INTRAVENOUS | Status: AC
  Administered 2021-06-19: 1 g via INTRAVENOUS

## 2021-06-18 MED ORDER — MAGNESIUM HYDROXIDE 400 MG/5ML PO SUSP
30.0000 mL | Freq: Every day | ORAL | Status: DC | PRN
  Administered 2021-06-20: 30 mL via ORAL
  Filled 2021-06-18: qty 30

## 2021-06-18 MED ORDER — ONDANSETRON HCL 4 MG/2ML IJ SOLN
4.0000 mg | Freq: Once | INTRAMUSCULAR | Status: AC
  Administered 2021-06-18: 4 mg via INTRAVENOUS
  Filled 2021-06-18: qty 2

## 2021-06-18 MED ORDER — LOPERAMIDE HCL 1 MG/7.5ML PO SUSP
1.0000 mg | Freq: Every day | ORAL | Status: DC
Start: 2021-06-18 — End: 2021-06-22
  Administered 2021-06-20 – 2021-06-22 (×3): 1 mg via ORAL
  Filled 2021-06-18 (×5): qty 7.5

## 2021-06-18 MED ORDER — SACUBITRIL-VALSARTAN 49-51 MG PO TABS
1.0000 | ORAL_TABLET | Freq: Two times a day (BID) | ORAL | Status: DC
  Administered 2021-06-18 – 2021-06-22 (×7): 1 via ORAL
  Filled 2021-06-18 (×9): qty 1

## 2021-06-18 MED ORDER — FENTANYL CITRATE PF 50 MCG/ML IJ SOSY
50.0000 ug | PREFILLED_SYRINGE | Freq: Once | INTRAMUSCULAR | Status: AC
  Administered 2021-06-18: 50 ug via INTRAVENOUS
  Filled 2021-06-18: qty 1

## 2021-06-18 NOTE — ED Triage Notes (Signed)
BIB [facility] from home s/p fall. Slipped/ tripped and fell on L side. C/o L hip and thigh pain, also L arm pain. Wearing L CAM walker, possible shortening and rotation. Rates pain 10/10. Denies other sx. Denies hitting head, LOC or blood thinners. Alert, NAD, calm, interactive, EDPA into room upon arrival. No meds PTA. No NSL. Daughter to follow.

## 2021-06-18 NOTE — ED Notes (Signed)
Pt alert, NAD, calm, interactive, family at [facility], VSS.

## 2021-06-18 NOTE — Plan of Care (Signed)

## 2021-06-18 NOTE — ED Notes (Signed)
Pt not in room, taken to imaging xray/CT

## 2021-06-18 NOTE — H&P (Addendum)
[place]   PATIENT NAME: Erin Hayes    MR#:  [id number]  DATE OF BIRTH:  01-Apr-1966  DATE OF ADMISSION:  06/18/2021  PRIMARY CARE PHYSICIAN: [name], NP   Patient is coming from: Home  REQUESTING/REFERRING PHYSICIAN: Ashok Cordia, PA-C  CHIEF COMPLAINT:   Chief Complaint  Patient presents with   Fall    HISTORY OF PRESENT ILLNESS:  Erin Hayes is a 56 y.o. African-American female with medical history significant for chronic combined systolic and diastolic CHF, type 2 diabetes mellitus, GERD, dyslipidemia, hypertension and nonischemic cardiomyopathy, who presented to the ER with acute onset of left hip pain.  The patient stated that she was going out of the door when her walking boot got caught and she fell on her left side, with subsequent left hip pain and inability to ambulate.  She had a left foot fracture about a year ago that healed and then she fell on it with subsequent swelling close to 3 months ago when she started wearing a boot on her left foot.  She denies any paresthesias or focal muscle weakness.  She denied any chest pain or palpitations.  No headache or dizziness or blurred vision.  No presyncope or syncope.  No dysuria, oliguria or hematuria or flank pain.  No cough or wheezing or hemoptysis.  ED Course: When she came to the ER BP was 181/112 with temperature of 99 and otherwise normal vital signs.  Labs revealed borderline potassium of 3.5 and a calcium of 8.7.  Hemoglobin was 11.9 hematocrit 37.8.  Respiratory panel is currently pending.   EKG as reviewed by me : Pending Imaging: Noncontrasted head CT scan revealed no acute intracranial normalities.  2 view left femur x-ray showed basicervical femoral neck fracture.  Left forearm x-ray was negative. Left hip CT showed nondisplaced, noncomminuted fracture of the left femoral neck extending from medial mid femoral neck to the superior subcapital portion associated with mild valgus and apex anterior  angulation.  It showed no other fractures and normally aligned left hip joint.  The patient was given 4 mg of IV morphine sulfate and 4 mg of IV Zofran as well as 10 mg of IV Reglan and 50 mcg of IV fentanyl.  She will be admitted to a medical-surgical bed for further evaluation and management.  PAST MEDICAL HISTORY:   Past Medical History:  Diagnosis Date   Carpal tunnel syndrome of right wrist    Chronic combined systolic (congestive) and diastolic (congestive) heart failure ([place])    a. 03/2017 Echo: EF 20-25%, diff HK, Gr2 DD; b. 09/2017 Echo: EF 20-25%, diff HK, Gr2 DD.   Diabetes ([place])    GERD (gastroesophageal reflux disease)    HLD (hyperlipidemia)    Hypertension    NICM (nonischemic cardiomyopathy) ([place])    a. 03/2017 Echo: EF 20-25%, diff HK, Gr2 DD, mild to mod MR, nl RV fxn;  b. 03/2017 Cath: mild nonobs dzs, EF 25%; c. 09/2017 Echo: EF 20-25%, diff HK. Gr2 DD. Mild MR. Mildly reduced RV fxn.   Pleural effusion, left    a. 03/2017 s/p thoracentesis-->300 ml withdrawn--transudative.   Pneumonia 02/19/2017    PAST SURGICAL HISTORY:   Past Surgical History:  Procedure Laterality Date   COLONOSCOPY WITH PROPOFOL N/A 01/21/2017   Procedure: COLONOSCOPY WITH PROPOFOL;  Surgeon: Jonathon Bellows, MD;  [facility] END[facility]COPY;  Service: Gastroenterology;  Laterality: N/A;   ESOPHAGOGASTRODUODENOSCOPY (EGD) WITH PROPOFOL N/A 01/21/2017   Procedure: ESOPHAGOGASTRODUODENOSCOPY (EGD)  WITH PROPOFOL;  Surgeon: Jonathon Bellows, MD;  Location: [facility] ENDOSCOPY;  Service: Gastroenterology;  Laterality: N/A;   FLEXIBLE SIGMOIDOSCOPY N/A 11/04/2016   Procedure: FLEXIBLE SIGMOIDOSCOPY;  Surgeon: Wilford Corner, MD;  Location: [facility] ENDOSCOPY;  Service: Endoscopy;  Laterality: N/A;   LEFT HEART CATH AND CORONARY ANGIOGRAPHY N/A 04/05/2017   Procedure: LEFT HEART CATH AND CORONARY ANGIOGRAPHY;  Surgeon: Wellington Hampshire, MD;  Location: [facility] CV LAB;  Service: Cardiovascular;  Laterality: N/A;    LITHOTRIPSY      SOCIAL HISTORY:   Social History   Tobacco Use   Smoking status: Never   Smokeless tobacco: Never  Substance Use Topics   Alcohol use: No    FAMILY HISTORY:   Family History  Problem Relation Age of Onset   Lung cancer Mother    Hypertension Father    CAD Father        a. MI age 80   Heart disease Father    COPD Neg Hx    Diabetes Mellitus II Neg Hx     DRUG ALLERGIES:  No Known Allergies  REVIEW OF SYSTEMS:   Review of Systems  Neurological:  Sensory change: .MYCHF.  As per history of present illness. All pertinent systems were reviewed above. Constitutional, HEENT, cardiovascular, respiratory, GI, GU, musculoskeletal, neuro, psychiatric, endocrine, integumentary and hematologic systems were reviewed and are otherwise negative/unremarkable except for positive findings mentioned above in the HPI.   MEDICATIONS AT HOME:   Prior to Admission medications   Medication Sig Start Date End Date Taking? Authorizing Provider  aspirin 81 MG tablet Take 81 mg by mouth daily.   Yes [provider]  carvedilol (COREG) 12.5 MG tablet Take 1.5 tablets (18.75 mg total) by mouth 2 (two) times daily. 05/20/20  Yes Minna Merritts, MD  Crisaborole (EUCRISA) 2 % OINT Apply to aa's BID. 01/31/21  Yes Ralene Bathe, MD  fluticasone [facility]) 50 MCG/ACT nasal spray Place 2 sprays into both nostrils daily. Use for 4-6 weeks then stop and use seasonally or as needed. 05/15/21  Yes Baity, Coralie Keens, NP  furosemide (LASIX) 20 MG tablet Take 1 tablet (20 mg total) by mouth daily. 11/26/19  Yes Malfi, Lupita Raider, FNP  ibuprofen (ADVIL) 400 MG tablet Take 400 mg by mouth 3 (three) times daily. 02/10/20  Yes [provider]  insulin glargine (LANTUS) 100 UNIT/ML Solostar Pen Inject 14 Units into the skin at bedtime. 09/07/20  Yes [name], NP  loperamide (IMODIUM) 1 MG/5ML solution Take 5 mLs (1 mg total) by mouth daily. 09/21/20  Yes [name], NP   meloxicam (MOBIC) 15 MG tablet Take 1 tablet (15 mg total) by mouth daily. 01/12/20  Yes Edrick Kins, DPM  ondansetron (ZOFRAN) 4 MG tablet Take 1 tablet (4 mg total) by mouth every 8 (eight) hours as needed for nausea or vomiting. 09/21/20  Yes Baity, Coralie Keens, NP  rosuvastatin (CRESTOR) 20 MG tablet Take 1 tablet (20 mg total) by mouth daily. 05/20/20  Yes Gollan, Kathlene November, MD  sacubitril-valsartan (ENTRESTO) 49-51 MG Take 1 tablet by mouth 2 (two) times daily. 05/11/20  Yes Malfi, Lupita Raider, FNP  spironolactone (ALDACTONE) 25 MG tablet Take 1 tablet (25 mg total) by mouth daily. 05/11/20  Yes Malfi, Lupita Raider, FNP  tacrolimus (PROTOPIC) 0.1 % ointment Apply topically 2 (two) times daily. 02/14/21  Yes Ralene Bathe, MD  triamcinolone cream (KENALOG) 0.1 % Apply to aa's BID x 2 weeks. Then decrease  use to BID 5d/wk. 11/30/20  Yes Ralene Bathe, MD  albuterol (VENTOLIN HFA) 108 (90 Base) MCG/ACT inhaler Inhale 2 puffs into the lungs every 4 (four) hours as needed for wheezing or shortness of breath. 11/26/19   Malfi, Lupita Raider, FNP  cetirizine (ZYRTEC) 10 MG tablet Take 1 tablet (10 mg total) by mouth daily. 05/15/21   [name], NP  Exenatide ER (BYDUREON BCISE) 2 MG/0.85ML AUIJ Inject 2 mg into the skin once a week. 11/26/19   Malfi, Lupita Raider, FNP  glucose blood (CONTOUR TEST) test strip by Other route Two (2) times a day. 05/13/13   [provider]  Insulin Pen Needle 31G X 5 MM MISC Use as directed with Lantus 11/26/19   Malfi, Lupita Raider, FNP  metFORMIN (GLUCOPHAGE) 500 MG tablet Take 1 tablet (500 mg total) by mouth 2 (two) times daily with a meal. Patient not taking: Reported on 06/18/2021 11/26/19   Verl Bangs, FNP      VITAL SIGNS:  Blood pressure 140/88, pulse (!) 106, temperature 98.7 F (37.1 C), temperature source Oral, resp. rate 17, weight 54.4 kg, SpO2 93 %.  PHYSICAL EXAMINATION:  Physical Exam  GENERAL:  56 y.o.-year-old African-American female patient lying in  the bed with no acute distress.  EYES: Pupils equal, round, reactive to light and accommodation. No scleral icterus. Extraocular muscles intact.  HEENT: Head atraumatic, normocephalic. Oropharynx and nasopharynx clear.  NECK:  Supple, no jugular venous distention. No thyroid enlargement, no tenderness.  LUNGS: Normal breath sounds bilaterally, no wheezing, rales,rhonchi or crepitation. No use of accessory muscles of respiration.  CARDIOVASCULAR: Regular rate and rhythm, S1, S2 normal. No murmurs, rubs, or gallops.  ABDOMEN: Soft, nondistended, nontender. Bowel sounds present. No organomegaly or mass.  EXTREMITIES: No pedal edema, cyanosis, or clubbing. Musculoskeletal: Left lateral hip tenderness with lateral rotation. NEUROLOGIC: Cranial nerves II through XII are intact. Muscle strength 5/5 in all extremities. Sensation intact. Gait not checked.  PSYCHIATRIC: The patient is alert and oriented x 3.  Normal affect and good eye contact. SKIN: No obvious rash, lesion, or ulcer.   LABORATORY PANEL:   CBC Recent Labs  Lab 06/18/21 1747  WBC 7.8  HGB 11.9*  HCT 37.8  PLT 338   ------------------------------------------------------------------------------------------------------------------  Chemistries  Recent Labs  Lab 06/18/21 1747  NA 136  K 3.5  CL 102  CO2 26  GLUCOSE 122*  BUN 20  CREATININE 0.94  CALCIUM 8.7*  AST 19  ALT 16  ALKPHOS 97  BILITOT 0.8   ------------------------------------------------------------------------------------------------------------------  Cardiac Enzymes No results for input(s): TROPONINI in the last 168 hours. ------------------------------------------------------------------------------------------------------------------  RADIOLOGY:  DG Forearm Left  Result Date: 06/18/2021 CLINICAL DATA:  Slipped and fell.  Forearm pain. EXAM: LEFT FOREARM - 2 VIEW COMPARISON:  None. FINDINGS: There is no evidence of fracture or other focal bone  lesions. Soft tissues are unremarkable. IMPRESSION: Negative. Electronically Signed   By: Nelson Chimes M.D.   On: 06/18/2021 17:27   CT HEAD WO CONTRAST (5MM)  Result Date: 06/18/2021 CLINICAL DATA:  Slipped and fell onto the left side. Complaining of left hip and thigh pain and left arm pain. EXAM: CT HEAD WITHOUT CONTRAST TECHNIQUE: Contiguous axial images were obtained from the base of the skull through the vertex without intravenous contrast. RADIATION DOSE REDUCTION: This exam was performed according to the departmental dose-optimization program which includes automated exposure control, adjustment of the mA and/or kV according to patient size and/or use of iterative  reconstruction technique. COMPARISON:  04/25/2013. FINDINGS: Brain: No evidence of acute infarction, hemorrhage, hydrocephalus, extra-axial collection or mass lesion/mass effect. Mild periventricular white matter hypoattenuation posteriorly, consistent with chronic microvascular ischemic change. Vascular: No hyperdense vessel or unexpected calcification. Skull: Normal. Negative for fracture or focal lesion. Sinuses/Orbits: Globes and orbits are unremarkable. Visualized sinuses are clear. Other: None. IMPRESSION: 1. No acute intracranial abnormalities. Electronically Signed   By: Lajean Manes M.D.   On: 06/18/2021 18:21   CT Hip Left Wo Contrast  Result Date: 06/18/2021 CLINICAL DATA:  Fall. Left-sided hip pain. Femoral neck fracture on radiographs. EXAM: CT OF THE LEFT HIP WITHOUT CONTRAST TECHNIQUE: Multidetector CT imaging of the left hip was performed according to the standard protocol. Multiplanar CT image reconstructions were also generated. RADIATION DOSE REDUCTION: This exam was performed according to the departmental dose-optimization program which includes automated exposure control, adjustment of the mA and/or kV according to patient size and/or use of iterative reconstruction technique. COMPARISON:  Current left hip radiographs.  FINDINGS: Bones/Joint/Cartilage Nondisplaced, non comminuted fracture of the left femoral neck. Fracture extends from the inferior medial aspect of the mid femoral neck to the superior subcapital portion. There is mild valgus angulation. There is also mild apex anterior angulation. No other fractures.  No bone lesions. Hip joint is normally spaced and aligned. Ligaments Suboptimally assessed by CT. Muscles and Tendons No muscle injury/hematoma.  Tendons are grossly intact. Soft tissues No soft tissue mass or contusion. No acute findings noted within the visualized pelvis. IMPRESSION: 1. Nondisplaced, non comminuted fracture of the left femoral neck extending from the medial mid femoral neck to the superior subcapital portion, associated with mild valgus and apex anterior angulation. 2. No other fractures.  Normally aligned left hip joint. Electronically Signed   By: Lajean Manes M.D.   On: 06/18/2021 18:25   DG Hip Unilat W or Wo Pelvis 2-3 Views Left  Result Date: 06/18/2021 CLINICAL DATA:  Slipped and fell.  Left-sided pain. EXAM: DG HIP (WITH OR WITHOUT PELVIS) 2-3V LEFT COMPARISON:  CT abdomen 10/03/2016 FINDINGS: Suspicion of acute rami fractures on the left. Suspicion also of nondisplaced basicervical femoral neck fracture. Consider CT scan for confirmation. IMPRESSION: Nondisplaced basicervical fracture of the femoral neck. Suspicion also of superior and inferior rami fractures on the left. Electronically Signed   By: Nelson Chimes M.D.   On: 06/18/2021 17:29   DG Femur Min 2 Views Left  Result Date: 06/18/2021 CLINICAL DATA:  [place].  Left-sided pain. EXAM: LEFT FEMUR 2 VIEWS COMPARISON:  Hip imaging same day. FINDINGS: No evidence of femur fracture distal to the nondisplaced basicervical femoral neck fracture. IMPRESSION: Basicervical femoral neck fracture.  No fracture distal to that. Electronically Signed   By: Nelson Chimes M.D.   On: 06/18/2021 17:30      IMPRESSION AND PLAN:  Principal  Problem:   Closed left hip fracture (HCC) 1.  Closed left hip fracture secondary to mechanical fall. - The patient will be admitted to a medical-surgical bed. - Pain management will be provided. - Orthopedic consultation will be obtained. - I notified Dr. [name] about the patient. -The patient has a history of CHF and diabetes mellitus on insulin without history of CVA, CAD or renal failure with creatinine more than 2.  She is considered moderate risk for perioperative cardiovascular events per the revised cardiac risk index.  She has no current pulmonary issues.  2.  Uncontrolled hypertension tension with hypertensive urgency. - We will continue her Coreg that  should provide perioperative cardiovascular risk reduction. - We will continue her Entresto. - We will place on as needed IV labetalol. - BP is partly uncontrolled from pain.  3.  Type 2 diabetes mellitus. - We will place the patient on supplement coverage with NovoLog and continue her basal coverage. - We will hold off on metformin.  4.  Dyslipidemia. - We will continue statin therapy that should provide perioperative cardiovascular risk reduction.  5.  Chronic diastolic and systolic CHF. - We will continue her Entresto and Coreg as well as Aldactone.  DVT prophylaxis: SCD.  Medical prophylaxis is being held off pending postoperative. Advanced Care Planning:  Code Status: full code. Family Communication:  The plan of care was discussed in details with the patient (and family). I answered all questions. The patient agreed to proceed with the above mentioned plan. Further management will depend upon hospital course. Disposition Plan: Back to previous home environment Consults called: Orthopedic consult. All the records are reviewed and case discussed with ED provider.  Status is: Inpatient   At the time of the admission, it appears that the appropriate admission status for this patient is inpatient.  This is judged to be  reasonable and necessary in order to provide the required intensity of service to ensure the patient's safety given the presenting symptoms, physical exam findings and initial radiographic and laboratory data in the context of comorbid conditions.  The patient requires inpatient status due to high intensity of service, high risk of further deterioration and high frequency of surveillance required.  I certify that at the time of admission, it is my clinical judgment that the patient will require inpatient hospital care extending more than 2 midnights.                            Dispo: The patient is from: Home              Anticipated d/c is to: Home              Patient currently is not medically stable to d/c.              Difficult to place patient: No   Christel Mormon M.D on 06/18/2021 at 7:49 PM  [facility]   From 7 PM-7 AM, contact night-coverage [email or web address]  CC: Primary care physician; [name], NP

## 2021-06-18 NOTE — ED Notes (Signed)
EDPA in to update pt and family

## 2021-06-18 NOTE — ED Provider Notes (Signed)
[facility] Provider Note    Event Date/Time   First MD Initiated Contact with Patient 06/18/21 1631     (approximate)   History   Fall   HPI  Erin Hayes is a 56 y.o. female with history of diabetes, hypertension, heart disease, and recent left foot fracture presents emergency department after a fall.  Patient states she was trying to go out the door when her walking boot got caught and she fell on her left side.  Is complaining of left hip and femur pain.  She did hit her head and has some swelling above her left brow.  Complaining of some pain in the left forearm.  No numbness or tingling.  No LOC.      Physical Exam   Triage Vital Signs: ED Triage Vitals [06/18/21 1641]  Enc Vitals Group     BP      Pulse      Resp      Temp      Temp src      SpO2 97 %     Weight      Height      Head Circumference      Peak Flow      Pain Score      Pain Loc      Pain Edu?      Excl. in [place]?     Most recent vital signs: Vitals:   06/18/21 1840 06/18/21 1845  BP: (!) 165/99 (!) 186/98  Pulse: 86 95  Resp:    Temp:    SpO2: 95% 99%     General: Awake, no distress.   CV:  Good peripheral perfusion. regular rate and  rhythm Resp:  Normal effort. Lungs CTA Abd:  No distention.   Other:  Left hip and femur tender to palpation, decreased range of motion secondary discomfort, left foot is rotated out with boot in place, left forearm is tender to palpation, facial swelling noted above the left brow, no lacerations noted   ED Results / Procedures / Treatments   Labs (all labs ordered are listed, but only abnormal results are displayed) Labs Reviewed  COMPREHENSIVE METABOLIC PANEL - Abnormal; Notable for the following components:      Result Value   Glucose, Bld 122 (*)    Calcium 8.7 (*)    All other components within normal limits  CBC WITH DIFFERENTIAL/PLATELET - Abnormal; Notable for the following components:   Hemoglobin 11.9 (*)     Abs Immature Granulocytes 0.08 (*)    All other components within normal limits  RESP PANEL BY RT-PCR (FLU A&B, COVID) [id number]     EKG     RADIOLOGY CT of the head, x-ray of the left hip pelvis, left femur, left forearm    PROCEDURES:   Procedures   MEDICATIONS ORDERED IN ED: Medications  ondansetron (ZOFRAN) injection 4 mg (4 mg Intravenous Given 06/18/21 1743)  morphine (PF) 4 MG/ML injection 4 mg (4 mg Intravenous Given 06/18/21 1744)  fentaNYL (SUBLIMAZE) injection 50 mcg (50 mcg Intravenous Given 06/18/21 1906)  metoCLOPramide (REGLAN) injection 10 mg (10 mg Intravenous Given 06/18/21 1906)     IMPRESSION / MDM / ASSESSMENT AND PLAN / ED COURSE  I reviewed the triage vital signs and the nursing notes.                              Differential diagnosis includes,  but is not limited to, left hip fracture, femur fracture, left forearm fracture, facial fracture, subdural hematoma, SAH,  Imaging and labs ordered  X-ray of the left hip was reviewed by me.  I did see a femoral neck fracture.  Confirmed by radiology.  Radiology also comments there may be a upper and lower rami fracture.  We will order CT of the hip and CT was instructed to evaluate the rami also.  X-ray of the left forearm was reviewed by me and is negative for fracture.  Confirmed by radiology  X-ray of the left femur was reviewed by me and confirms femoral neck fracture.  Confirmed by radiology also  Patient was given morphine 4 mg IV.  Will monitor for pain control.  CT of the head and CT of the left hip are both pending  CT of the head was reviewed by me.  Radiologist reads as negative for any acute abnormality  CT of the left hip confirms left femoral neck fracture.  No rami fracture  Patient continued to have pain.  Given fentanyl 50 mcg IV.  Reglan as she started to have more vomiting.  Consult to orthopedics, consult hospitalist  Spoke with Dr. [name].  Like for [place] to have medicine admit  and he will see her on the floor.  Spoke with hospitalist.  Will be admitting the patient.  Patient is in stable condition at this time.      FINAL CLINICAL IMPRESSION(S) / ED DIAGNOSES   Final diagnoses:  Closed displaced fracture of left femoral neck ([place])  Fall, initial encounter     Rx / DC Orders   ED Discharge Orders     None        Note:  This document was prepared using Dragon voice recognition software and may include unintentional dictation errors.    Versie Starks, PA-C 06/18/21 [place], MD 06/18/21 2351

## 2021-06-19 ENCOUNTER — Other Ambulatory Visit: Payer: Self-pay

## 2021-06-19 ENCOUNTER — Inpatient Hospital Stay: Payer: Medicare Other | Admitting: Anesthesiology

## 2021-06-19 ENCOUNTER — Encounter
Admission: EM | Disposition: A | Discharge status: Skilled Nursing Facility | Payer: Self-pay | Source: Home / Self Care | Attending: Internal Medicine

## 2021-06-19 ENCOUNTER — Inpatient Hospital Stay: Payer: Medicare Other

## 2021-06-19 ENCOUNTER — Encounter: Payer: Self-pay | Admitting: Family Medicine

## 2021-06-19 DIAGNOSIS — I10 Essential (primary) hypertension: Secondary | ICD-10-CM

## 2021-06-19 DIAGNOSIS — E1169 Type 2 diabetes mellitus with other specified complication: Secondary | ICD-10-CM | POA: Diagnosis not present

## 2021-06-19 DIAGNOSIS — S72002A Fracture of unspecified part of neck of left femur, initial encounter for closed fracture: Secondary | ICD-10-CM | POA: Diagnosis not present

## 2021-06-19 HISTORY — PX: HIP PINNING,CANNULATED: SHX1758

## 2021-06-19 LAB — BASIC METABOLIC PANEL WITH GFR
Anion gap: 5 (ref 5–15)
BUN: 19 mg/dL (ref 6–20)
CO2: 23 mmol/L (ref 22–32)
Calcium: 7.8 mg/dL — ABNORMAL LOW (ref 8.9–10.3)
Chloride: 109 mmol/L (ref 98–111)
Creatinine, Ser: 0.89 mg/dL (ref 0.44–1.00)
GFR, Estimated: >60 mL/min
Glucose, Bld: 135 mg/dL — ABNORMAL HIGH (ref 70–99)
Potassium: 3.7 mmol/L (ref 3.5–5.1)
Sodium: 137 mmol/L (ref 135–145)

## 2021-06-19 LAB — GLUCOSE, CAPILLARY
Glucose-Capillary: 95 mg/dL (ref 70–99)
Glucose-Capillary: 91 mg/dL (ref 70–99)
Glucose-Capillary: 75 mg/dL (ref 70–99)
Glucose-Capillary: 73 mg/dL (ref 70–99)
Glucose-Capillary: 91 mg/dL (ref 70–99)
Glucose-Capillary: 222 mg/dL — ABNORMAL HIGH (ref 70–99)
Glucose-Capillary: 249 mg/dL — ABNORMAL HIGH (ref 70–99)

## 2021-06-19 LAB — CBC
HCT: 33.6 % — ABNORMAL LOW (ref 36.0–46.0)
Hemoglobin: 10.5 g/dL — ABNORMAL LOW (ref 12.0–15.0)
MCH: 27.6 pg (ref 26.0–34.0)
MCHC: 31.3 g/dL (ref 30.0–36.0)
MCV: 88.2 fL (ref 80.0–100.0)
Platelets: 252 10*3/uL (ref 150–400)
RBC: 3.81 MIL/uL — ABNORMAL LOW (ref 3.87–5.11)
RDW: 13.4 % (ref 11.5–15.5)
WBC: 8.3 10*3/uL (ref 4.0–10.5)
nRBC: 0 % (ref 0.0–0.2)

## 2021-06-19 LAB — HIV ANTIBODY (ROUTINE TESTING W REFLEX): HIV Screen 4th Generation wRfx: NONREACTIVE

## 2021-06-19 SURGERY — FIXATION, FEMUR, NECK, PERCUTANEOUS, USING SCREW
Anesthesia: General | Site: Hip | Laterality: Left

## 2021-06-19 MED ORDER — ONDANSETRON HCL 4 MG/2ML IJ SOLN: 4.0000 mg | Freq: Once | INTRAMUSCULAR | Status: DC | PRN

## 2021-06-19 MED ORDER — OXYCODONE HCL 5 MG/5ML PO SOLN: 5.0000 mg | Freq: Once | ORAL | Status: AC | PRN

## 2021-06-19 MED ORDER — ACETAMINOPHEN 10 MG/ML IV SOLN
INTRAVENOUS | Status: AC
  Filled 2021-06-19: qty 100

## 2021-06-19 MED ORDER — GLUCERNA SHAKE PO LIQD
237.0000 mL | Freq: Three times a day (TID) | ORAL | Status: DC
  Administered 2021-06-19 – 2021-06-21 (×5): 237 mL via ORAL
  Filled 2021-06-19 (×4): qty 237

## 2021-06-19 MED ORDER — FENTANYL CITRATE (PF) 100 MCG/2ML IJ SOLN
INTRAMUSCULAR | Status: DC | PRN
  Administered 2021-06-19: 50 ug via INTRAVENOUS

## 2021-06-19 MED ORDER — SUGAMMADEX SODIUM 200 MG/2ML IV SOLN
INTRAVENOUS | Status: DC | PRN
  Administered 2021-06-19: 200 mg via INTRAVENOUS

## 2021-06-19 MED ORDER — LIDOCAINE HCL (CARDIAC) PF 100 MG/5ML IV SOSY
PREFILLED_SYRINGE | INTRAVENOUS | Status: DC | PRN
  Administered 2021-06-19: 80 mg via INTRAVENOUS

## 2021-06-19 MED ORDER — DEXAMETHASONE SODIUM PHOSPHATE 10 MG/ML IJ SOLN
INTRAMUSCULAR | Status: DC | PRN
  Administered 2021-06-19: 10 mg via INTRAVENOUS

## 2021-06-19 MED ORDER — FENTANYL CITRATE (PF) 100 MCG/2ML IJ SOLN: 25.0000 ug | INTRAMUSCULAR | Status: DC | PRN

## 2021-06-19 MED ORDER — PROPOFOL 10 MG/ML IV BOLUS
INTRAVENOUS | Status: AC
  Filled 2021-06-19: qty 20

## 2021-06-19 MED ORDER — CEFAZOLIN SODIUM-DEXTROSE 1-4 GM/50ML-% IV SOLN
1.0000 g | Freq: Three times a day (TID) | INTRAVENOUS | Status: AC
  Administered 2021-06-19 – 2021-06-20 (×3): 1 g via INTRAVENOUS
  Filled 2021-06-19 (×3): qty 50

## 2021-06-19 MED ORDER — BUPIVACAINE-EPINEPHRINE (PF) 0.5% -1:200000 IJ SOLN
INTRAMUSCULAR | Status: AC
  Filled 2021-06-19: qty 30

## 2021-06-19 MED ORDER — ADULT MULTIVITAMIN W/MINERALS CH
1.0000 | ORAL_TABLET | Freq: Every day | ORAL | Status: DC
  Administered 2021-06-20 – 2021-06-22 (×3): 1 via ORAL
  Filled 2021-06-19 (×3): qty 1

## 2021-06-19 MED ORDER — ENOXAPARIN SODIUM 40 MG/0.4ML IJ SOSY
40.0000 mg | PREFILLED_SYRINGE | INTRAMUSCULAR | Status: DC
  Administered 2021-06-20 – 2021-06-22 (×3): 40 mg via SUBCUTANEOUS
  Filled 2021-06-19 (×3): qty 0.4

## 2021-06-19 MED ORDER — PHENYLEPHRINE HCL (PRESSORS) 10 MG/ML IV SOLN
INTRAVENOUS | Status: DC | PRN
  Administered 2021-06-19: 80 ug via INTRAVENOUS
  Administered 2021-06-19 (×2): 160 ug via INTRAVENOUS
  Administered 2021-06-19 (×2): 80 ug via INTRAVENOUS
  Administered 2021-06-19: 160 ug via INTRAVENOUS
  Administered 2021-06-19: 80 ug via INTRAVENOUS

## 2021-06-19 MED ORDER — CEFAZOLIN SODIUM-DEXTROSE 1-4 GM/50ML-% IV SOLN
INTRAVENOUS | Status: AC
  Filled 2021-06-19: qty 50

## 2021-06-19 MED ORDER — LIDOCAINE HCL (PF) 1 % IJ SOLN
INTRAMUSCULAR | Status: AC
  Filled 2021-06-19: qty 30

## 2021-06-19 MED ORDER — OXYCODONE HCL 5 MG PO TABS
ORAL_TABLET | ORAL | Status: AC
  Filled 2021-06-19: qty 1

## 2021-06-19 MED ORDER — EPHEDRINE 5 MG/ML INJ
INTRAVENOUS | Status: AC
  Filled 2021-06-19: qty 5

## 2021-06-19 MED ORDER — ROCURONIUM BROMIDE 100 MG/10ML IV SOLN
INTRAVENOUS | Status: DC | PRN
  Administered 2021-06-19: 50 mg via INTRAVENOUS

## 2021-06-19 MED ORDER — 0.9 % SODIUM CHLORIDE (POUR BTL) OPTIME
TOPICAL | Status: DC | PRN
  Administered 2021-06-19: 500 mL

## 2021-06-19 MED ORDER — FENTANYL CITRATE (PF) 100 MCG/2ML IJ SOLN
INTRAMUSCULAR | Status: AC
  Filled 2021-06-19: qty 2

## 2021-06-19 MED ORDER — MIDAZOLAM HCL 2 MG/2ML IJ SOLN
INTRAMUSCULAR | Status: DC | PRN
  Administered 2021-06-19: 1 mg via INTRAVENOUS

## 2021-06-19 MED ORDER — ACETAMINOPHEN 10 MG/ML IV SOLN: 1000.0000 mg | Freq: Once | INTRAVENOUS | Status: DC | PRN

## 2021-06-19 MED ORDER — ACETAMINOPHEN 10 MG/ML IV SOLN
INTRAVENOUS | Status: DC | PRN
  Administered 2021-06-19: 1000 mg via INTRAVENOUS

## 2021-06-19 MED ORDER — ONDANSETRON HCL 4 MG/2ML IJ SOLN
INTRAMUSCULAR | Status: AC
  Filled 2021-06-19: qty 2

## 2021-06-19 MED ORDER — PROPOFOL 10 MG/ML IV BOLUS
INTRAVENOUS | Status: DC | PRN
  Administered 2021-06-19: 150 mg via INTRAVENOUS

## 2021-06-19 MED ORDER — MIDAZOLAM HCL 2 MG/2ML IJ SOLN
INTRAMUSCULAR | Status: AC
  Filled 2021-06-19: qty 2

## 2021-06-19 MED ORDER — INSULIN ASPART 100 UNIT/ML IJ SOLN
0.0000 [IU] | INTRAMUSCULAR | Status: DC
  Administered 2021-06-19 (×2): 2 [IU] via SUBCUTANEOUS
  Administered 2021-06-20: 3 [IU] via SUBCUTANEOUS
  Administered 2021-06-20: 2 [IU] via SUBCUTANEOUS
  Administered 2021-06-20: 1 [IU] via SUBCUTANEOUS
  Filled 2021-06-19 (×5): qty 1

## 2021-06-19 MED ORDER — DEXAMETHASONE SODIUM PHOSPHATE 10 MG/ML IJ SOLN
INTRAMUSCULAR | Status: AC
  Filled 2021-06-19: qty 1

## 2021-06-19 MED ORDER — LACTATED RINGERS IV SOLN: INTRAVENOUS | Status: DC

## 2021-06-19 MED ORDER — LIDOCAINE HCL (PF) 1 % IJ SOLN
INTRAMUSCULAR | Status: DC | PRN
  Administered 2021-06-19: 50 mL

## 2021-06-19 MED ORDER — OXYCODONE HCL 5 MG PO TABS
5.0000 mg | ORAL_TABLET | Freq: Once | ORAL | Status: AC | PRN
  Administered 2021-06-19: 5 mg via ORAL

## 2021-06-19 MED ORDER — ONDANSETRON HCL 4 MG/2ML IJ SOLN
INTRAMUSCULAR | Status: DC | PRN
Start: 2021-06-19 — End: 2021-06-19
  Administered 2021-06-19: 4 mg via INTRAVENOUS

## 2021-06-19 MED ORDER — EPHEDRINE SULFATE (PRESSORS) 50 MG/ML IJ SOLN
INTRAMUSCULAR | Status: DC | PRN
  Administered 2021-06-19: 5 mg via INTRAVENOUS

## 2021-06-19 SURGICAL SUPPLY — 38 items
4.3MM DRILL BIT, 413MM IN LENGTH ×1 IMPLANT
APL PRP STRL LF DISP 70% ISPRP (MISCELLANEOUS) ×1
BIT DRILL 4.3X413 (BIT) ×2
BIT DRILL CANN 4.3X413 (BIT) IMPLANT
BIT DRILL OPENING REAMER ASSEM (INSTRUMENTS) ×2
BLADE SURG SZ11 CARB STEEL (BLADE) ×2 IMPLANT
BNDG COHESIVE 4X5 TAN ST LF (GAUZE/BANDAGES/DRESSINGS) ×2 IMPLANT
BOLT FEM NECK 75 L (Bolt) ×1 IMPLANT
CHLORAPREP W/TINT 26 (MISCELLANEOUS) ×2 IMPLANT
COMPLETE OPENING DRILL BIT/ REAMER ASSEMBLY ×1 IMPLANT
DRSG AQUACEL AG ADV 3.5X10 (GAUZE/BANDAGES/DRESSINGS) ×2 IMPLANT
GAUZE SPONGE 4X4 12PLY STRL (GAUZE/BANDAGES/DRESSINGS) ×2 IMPLANT
GLOVE SURG ENC MOIS LTX SZ8 (GLOVE) ×2 IMPLANT
GLOVE SURG ORTHO LTX SZ8.5 (GLOVE) ×2 IMPLANT
GLOVE SURG XRAY 8.5 LX (GLOVE) ×1 IMPLANT
GOWN STRL REUS W/ TWL LRG LVL3 (GOWN DISPOSABLE) ×1 IMPLANT
GOWN STRL REUS W/TWL LRG LVL3 (GOWN DISPOSABLE) ×2
GOWN STRL REUS W/TWL LRG LVL4 (GOWN DISPOSABLE) ×2 IMPLANT
GUIDEWIRE 3.2X400 (WIRE) ×1 IMPLANT
KIT TURNOVER KIT A (KITS) ×2 IMPLANT
MANIFOLD NEPTUNE II (INSTRUMENTS) ×2 IMPLANT
MAT ABSORB  FLUID 56X50 GRAY (MISCELLANEOUS) ×1
MAT ABSORB FLUID 56X50 GRAY (MISCELLANEOUS) ×1 IMPLANT
NDL SPNL 18GX3.5 QUINCKE PK (NEEDLE) ×1 IMPLANT
NEEDLE SPNL 18GX3.5 QUINCKE PK (NEEDLE) ×2 IMPLANT
NS IRRIG 500ML POUR BTL (IV SOLUTION) ×2 IMPLANT
PACK HIP COMPR (MISCELLANEOUS) ×2 IMPLANT
PLATE FEM NECK 1H (Plate) ×1 IMPLANT
REAMER DRILL BIT 10.2X251 (INSTRUMENTS) IMPLANT
SCREW ANTIROTATN FEM NECK 75 L (Screw) ×1 IMPLANT
SCREW LOCK ST 5.0X40 (Screw) ×1 IMPLANT
STAPLER SKIN PROX 35W (STAPLE) ×1 IMPLANT
STRAP SAFETY 5IN WIDE (MISCELLANEOUS) ×2 IMPLANT
SUT ETHILON 3 0 FSLX (SUTURE) ×1 IMPLANT
SUT VIC AB 0 CT1 36 (SUTURE) ×1 IMPLANT
SUT VIC AB 2-0 CT2 27 (SUTURE) ×1 IMPLANT
SYR 30ML LL (SYRINGE) ×2 IMPLANT
WATER STERILE IRR 500ML POUR (IV SOLUTION) ×1 IMPLANT

## 2021-06-19 NOTE — Progress Notes (Signed)
Initial Nutrition Assessment  DOCUMENTATION CODES:   Not applicable  INTERVENTION:   -Once diet is advanced, add:   -Glucerna Shake po TID, each supplement provides 220 kcal and 10 grams of protein  -MVI with minerals daily  NUTRITION DIAGNOSIS:   Increased nutrient needs related to post-op healing as evidenced by estimated needs.  GOAL:   Patient will meet greater than or equal to 90% of their needs  MONITOR:   PO intake, Supplement acceptance, Diet advancement, Labs, Weight trends, Skin, I & O's  REASON FOR ASSESSMENT:   Consult Assessment of nutrition requirement/status, Hip fracture protocol  ASSESSMENT:   Erin Hayes is a 56 y.o. African-American female with medical history significant for chronic combined systolic and diastolic CHF, type 2 diabetes mellitus, GERD, dyslipidemia, hypertension and nonischemic cardiomyopathy, who presented to the ER with acute onset of left hip pain.  The patient stated that she was going out of the door when her walking boot got caught and she fell on her left side, with subsequent left hip pain and inability to ambulate.  She had a left foot fracture about a year ago that healed and then she fell on it with subsequent swelling close to 3 months ago when she started wearing a boot on her left foot.  She denies any paresthesias or focal muscle weakness.  She denied any chest pain or palpitations.  No headache or dizziness or blurred vision.  No presyncope or syncope.  No dysuria, oliguria or hematuria or flank pain.  No cough or wheezing or hemoptysis.  Pt admitted with closed lt hip fracture.   Reviewed I/O's: +250 ml x 24 hours  UOP: 250 ml x 24 hours   Per orthopedics notes, plan for percutaneous pinning of lt hip. Pt currently NPO for procedure.   Pt sleeping soundly at time of visit. She did not arouse to touch or voice. No family present.   Reviewed wt hx; wt has been stable over the past year.   Medications reviewed and  include lasix, imodium, aldactone, and 0.9% sodium chloride infusion @ 50 ml/hr.   Lab Results  Component Value Date   HGBA1C 7.2 (A) 05/11/2020   PTA DM medications are 500 mg metformin BID and 14 units insulin glargine daily at bedtime.   Labs reviewed: CBGS: 91-151 (inpatient orders for glycemic control are 0-6 units insulin aspart every 4 hours and 14 units insulin glargine-yfgn dauly).    NUTRITION - FOCUSED PHYSICAL EXAM:  Flowsheet Row Most Recent Value  Orbital Region No depletion  Upper Arm Region No depletion  Thoracic and Lumbar Region No depletion  Buccal Region No depletion  Temple Region No depletion  Clavicle Bone Region No depletion  Clavicle and Acromion Bone Region No depletion  Scapular Bone Region No depletion  Dorsal Hand No depletion  Patellar Region No depletion  Anterior Thigh Region No depletion  Posterior Calf Region No depletion  Edema (RD Assessment) None  Hair Reviewed  Eyes Reviewed  Mouth Reviewed  Skin Reviewed  Nails Reviewed       Diet Order:   Diet Order             Diet NPO time specified  Diet effective now                   EDUCATION NEEDS:   Education needs have been addressed  Skin:  Skin Assessment: Reviewed RN Assessment  Last BM:  06/18/21  Height:   Ht Readings from Last  1 Encounters:  06/19/21 4\' 11"  (1.499 m)    Weight:   Wt Readings from Last 1 Encounters:  06/19/21 54.4 kg    Ideal Body Weight:  44.7 kg  BMI:  Body mass index is 24.24 kg/m.  Estimated Nutritional Needs:   Kcal:  1350-1550  Protein:  65-80 grams  Fluid:  > 1.3 L    Loistine Chance, RD, LDN, [place] Registered Dietitian II Certified Diabetes Care and Education Specialist Please refer to [facility] for RD and/or RD on-call/weekend/after hours pager

## 2021-06-19 NOTE — Anesthesia Preprocedure Evaluation (Addendum)
Anesthesia Evaluation  Patient identified by MRN, date of birth, ID band Patient awake    Reviewed: Allergy & Precautions, NPO status , Patient's Chart, lab work & pertinent test results  History of Anesthesia Complications Negative for: history of anesthetic complications  Airway Mallampati: IV   Neck ROM: Full    Dental  (+) Edentulous Upper, Edentulous Lower   Pulmonary neg pulmonary ROS,    Pulmonary exam normal breath sounds clear to auscultation       Cardiovascular hypertension, +CHF (EF 45-50% 2/2 NICM)  Normal cardiovascular exam Rhythm:Regular Rate:Normal  Echo 05/21/19:  Left ventricular ejection fraction, by visual estimation, is 45 to 50%. The left ventricle has normal function. There is mildly increased left ventricular hypertrophy.   Neuro/Psych negative neurological ROS     GI/Hepatic GERD  ,  Endo/Other  diabetes, Type 2  Renal/GU negative Renal ROS     Musculoskeletal   Abdominal   Peds  Hematology negative hematology ROS (+)   Anesthesia Other Findings Cardiology note 05/20/20:   Chronic systolic congestive heart failure ([place]) - Plan: EKG stable,  lab work reviewed, appears relatively euvolemic on exam Some report of high blood pressure, recommend she increase carvedilol up to 18.75 mg twice daily He does not have blood pressure cuff at home to monitor Much better number here today compared to primary care We will go slowly with medication changes  Type 2 diabetes mellitus without complication, without long-term current use of insulin (HCC)  hemoglobin A1c 12 down to 7 Remarkable improvement, discussed with her  NICM (nonischemic cardiomyopathy) ([place]) EF 45 to 50% Stay on entresto Carvedilol, spironolactone, Lasix  Essential hypertension Small changes as above to carvedilol  Reproductive/Obstetrics                            Anesthesia Physical Anesthesia  Plan  ASA: 3  Anesthesia Plan: General   Post-op Pain Management:    Induction: Intravenous  PONV Risk Score and Plan: 3 and Ondansetron, Dexamethasone and Treatment may vary due to age or medical condition  Airway Management Planned: Oral ETT  Additional Equipment:   Intra-op Plan:   Post-operative Plan: Extubation in OR  Informed Consent: I have reviewed the patients History and Physical, chart, labs and discussed the procedure including the risks, benefits and alternatives for the proposed anesthesia with the patient or authorized representative who has indicated his/her understanding and acceptance.     Dental advisory given  Plan Discussed with: CRNA  Anesthesia Plan Comments: (Patient consented for risks of anesthesia including but not limited to:  - adverse reactions to medications - damage to eyes, teeth, lips or other oral mucosa - nerve damage due to positioning  - sore throat or hoarseness - damage to heart, brain, nerves, lungs, other parts of body or loss of life  Informed patient about role of CRNA in peri- and intra-operative care.  Patient voiced understanding.)        Anesthesia Quick Evaluation

## 2021-06-19 NOTE — Op Note (Signed)
DATE OF SURGERY:  06/19/2021  TIME: 5:08 PM  PATIENT NAME:  Erin Hayes  AGE: 56 y.o.  PRE-OPERATIVE DIAGNOSIS:  Left Hip Non-displaced Femoral Neck Fracture  POST-OPERATIVE DIAGNOSIS:  SAME  PROCEDURE:  Left femoral neck FNS  SURGEON:  Renee Harder  EBL:  50 cc  COMPLICATIONS:  none  OPERATIVE IMPLANTS: Synthes FNS 1-hole plate, 50mm lag screw  PREOPERATIVE INDICATIONS:  Erin Hayes is a 56 y.o. year old who fell and suffered a hip fracture. She was brought into the ER and then admitted and optimized and then elected for surgical intervention.    The risks benefits and alternatives were discussed with the patient including but not limited to the risks of nonoperative treatment, versus surgical intervention including infection, bleeding, nerve injury, malunion, nonunion, hardware prominence, hardware failure, need for hardware removal, blood clots, cardiopulmonary complications, morbidity, mortality, among others, and they were willing to proceed.    OPERATIVE PROCEDURE:  The patient was brought to the operating room and placed in the supine position.  General anesthesia was administered.  She was placed on the fracture table.  Closed reduction was performed under C-arm guidance. The length of the femur was also measured using fluoroscopy. Time out was then performed after sterile prep and drape. She received preoperative antibiotics.  Incision was made at the level of the lesser trochanter.  Dissection was carried through IT band.  130 degree guide was utilized to place a pin center center in the femoral head.  Screw length measured 70 mm.  1 hole plate was assembled on the back table.  Reamer was utilized.  1 hole plate was impacted.  Locking screw was placed through the jig at the level of the lesser trochanter.  Antirotation screw was then placed.  Final AP and lateral x-rays were obtained and saved to the permanent record.  Thoroughly irrigated.  Closed in layered  fashion with 0 Vicryl in the IT band, 2-0 Vicryl subcutaneous tissue and staples in the skin.  Sterile dressing was applied.  Patient was woke from anesthesia and transferred to the PACU in stable condition.   POSTOPERATIVE PLAN: She will be touch down weightbearing of LLE [place] to start DVT ppx POD#1 - lovenox x 14 days Dressing change by nursing staff as needed to keep dressing clean and dry Outpatient f/u in clinic in 2 weeks for staple removal and xrays  Renee Harder

## 2021-06-19 NOTE — Transfer of Care (Signed)
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  Procedure(s) Performed: CANNULATED HIP PINNING (Left: Hip)  Patient Location: PACU  Anesthesia Type:General  Level of Consciousness: awake and drowsy  Airway & Oxygen Therapy: Patient Spontanous Breathing and Patient connected to face mask oxygen  Post-op Assessment: Report given to RN and Post -op Vital signs reviewed and stable  Post vital signs: Reviewed and stable  Last Vitals:  Vitals Value Taken Time  BP 147/96 06/19/21 1724  Temp    Pulse 77 06/19/21 1724  Resp 17 06/19/21 1724  SpO2 100 % 06/19/21 1724    Last Pain:  Vitals:   06/19/21 1413  TempSrc: Temporal  PainSc: 4       Patients Stated Pain Goal: 0 (86/75/44 9201)  Complications: No notable events documented.

## 2021-06-19 NOTE — Progress Notes (Signed)
PROGRESS NOTE    HALO SHEVLIN  PTW:[id number] DOB: 05/11/1965 DOA: 06/18/2021 PCP: [name], NP   Assessment & Plan:   Principal Problem:   Closed left hip fracture ([place])   Closed left hip fracture: secondary to mechanical fall. Will go for hip surgery today. Oxycodone, morphine prn. Ortho surg following and recs apprec   HTN urgency: urgency resolved but still w/ uncontrolled HTN. Continue on carvedilol, entresto, aldactone. IV labetalol prn   DM2: poorly controlled. Continue on glargine, SSI w/ accuchecks  HLD: continue on statin   Chronic combined CHF: continue on carvedilol, entresto, aldactone. Monitor I/Os.    DVT prophylaxis: SCDs Code Status: full  Family Communication:  Disposition Plan: depends on PT/OT recs (not consulted yet)  Level of care: Med-Surg  Status is: Inpatient Remains inpatient appropriate because: severity of illness, will go for hip surg today     Consultants:  Ortho surg   Procedures:   Antimicrobials:   Subjective: Pt c/o hip pain   Objective: Vitals:   06/18/21 2035 06/19/21 0300 06/19/21 0358 06/19/21 0720  BP: (!) 151/99  104/76 108/76  Pulse: 100  77 77  Resp: 17  17 16   Temp:  99.1 F (37.3 C)  98.7 F (37.1 C)  TempSrc:  Oral  Oral  SpO2: 98%  93% 97%  Weight:      Height:        Intake/Output Summary (Last 24 hours) at 06/19/2021 0735 Last data filed at 06/18/2021 1906 Gross per 24 hour  Intake 250 ml  Output --  Net 250 ml   Filed Weights   06/18/21 1646 06/18/21 2000  Weight: 54.4 kg 53.5 kg    Examination:  General exam: Appears calm and comfortable  Respiratory system: Clear to auscultation. Respiratory effort normal. Cardiovascular system: S1 & S2+. No rubs, gallops or clicks. No pedal edema. Gastrointestinal system: Abdomen is nondistended, soft and nontender. Normal bowel sounds heard. Central nervous system: Alert and oriented. Moves all extremities  Psychiatry: Judgement and insight  appear normal. Flat mood and affect     Data Reviewed: I have personally reviewed following labs and imaging studies  CBC: Recent Labs  Lab 06/18/21 1747 06/19/21 0623  WBC 7.8 8.3  NEUTROABS 5.3  --   HGB 11.9* 10.5*  HCT 37.8 33.6*  MCV 86.7 88.2  PLT 338 700   Basic Metabolic Panel: Recent Labs  Lab 06/18/21 1747 06/19/21 0623  NA 136 137  K 3.5 3.7  CL 102 109  CO2 26 23  GLUCOSE 122* 135*  BUN 20 19  CREATININE 0.94 0.89  CALCIUM 8.7* 7.8*   GFR: Estimated Creatinine Clearance: 52.1 mL/min (by C-G formula based on SCr of 0.89 mg/dL). Liver Function Tests: Recent Labs  Lab 06/18/21 1747  AST 19  ALT 16  ALKPHOS 97  BILITOT 0.8  PROT 7.0  ALBUMIN 3.8   No results for input(s): LIPASE, AMYLASE in the last 168 hours. No results for input(s): AMMONIA in the last 168 hours. Coagulation Profile: No results for input(s): INR, PROTIME in the last 168 hours. Cardiac Enzymes: No results for input(s): CKTOTAL, CKMB, CKMBINDEX, TROPONINI in the last 168 hours. BNP (last 3 results) No results for input(s): PROBNP in the last 8760 hours. HbA1C: No results for input(s): HGBA1C in the last 72 hours. CBG: Recent Labs  Lab 06/18/21 2125  GLUCAP 151*   Lipid Profile: No results for input(s): CHOL, HDL, LDLCALC, TRIG, CHOLHDL, LDLDIRECT in the last 72 hours.  Thyroid Function Tests: No results for input(s): TSH, T4TOTAL, FREET4, T3FREE, THYROIDAB in the last 72 hours. Anemia Panel: No results for input(s): VITAMINB12, FOLATE, FERRITIN, TIBC, IRON, RETICCTPCT in the last 72 hours. Sepsis Labs: No results for input(s): PROCALCITON, LATICACIDVEN in the last 168 hours.  Recent Results (from the past 240 hour(s))  Resp Panel by RT-PCR (Flu A&B, Covid) Nasopharyngeal Swab     Status: None   Collection Time: 06/18/21  7:50 PM   Specimen: Nasopharyngeal Swab; Nasopharyngeal(NP) swabs in vial transport medium  Result Value Ref Range Status   SARS Coronavirus 2 by RT  PCR NEGATIVE NEGATIVE Final    Comment: (NOTE) SARS-CoV-2 target nucleic acids are NOT DETECTED.  The SARS-CoV-2 RNA is generally detectable in upper respiratory specimens during the acute phase of infection. The lowest concentration of SARS-CoV-2 viral copies this assay can detect is 138 copies/mL. A negative result does not preclude SARS-Cov-2 infection and should not be used as the sole basis for treatment or other patient management decisions. A negative result may occur with  improper specimen collection/handling, submission of specimen other than nasopharyngeal swab, presence of viral mutation(s) within the areas targeted by this assay, and inadequate number of viral copies(<138 copies/mL). A negative result must be combined with clinical observations, patient history, and epidemiological information. The expected result is Negative.  Fact Sheet for Patients:  EntrepreneurPulse.com.au  Fact Sheet for Healthcare Providers:  IncredibleEmployment.be  This test is no t yet approved or cleared by the [place] FDA and  has been authorized for detection and/or diagnosis of SARS-CoV-2 by FDA under an Emergency Use Authorization (EUA). This EUA will remain  in effect (meaning this test can be used) for the duration of the COVID-19 declaration under Section 564(b)(1) of the Act, 21 U.S.C.section 360bbb-3(b)(1), unless the authorization is terminated  or revoked sooner.       Influenza A by PCR NEGATIVE NEGATIVE Final   Influenza B by PCR NEGATIVE NEGATIVE Final    Comment: (NOTE) The Xpert Xpress SARS-CoV-2/FLU/RSV plus assay is intended as an aid in the diagnosis of influenza from Nasopharyngeal swab specimens and should not be used as a sole basis for treatment. Nasal washings and aspirates are unacceptable for Xpert Xpress SARS-CoV-2/FLU/RSV testing.  Fact Sheet for Patients: EntrepreneurPulse.com.au  Fact Sheet for  Healthcare Providers: IncredibleEmployment.be  This test is not yet approved or cleared by the [place] FDA and has been authorized for detection and/or diagnosis of SARS-CoV-2 by FDA under an Emergency Use Authorization (EUA). This EUA will remain in effect (meaning this test can be used) for the duration of the COVID-19 declaration under Section 564(b)(1) of the Act, 21 U.S.C. section 360bbb-3(b)(1), unless the authorization is terminated or revoked.  Performed at [facility], [address], [place], [place] [address]   Surgical PCR screen     Status: None   Collection Time: 06/18/21  8:39 PM   Specimen: Nasal Mucosa; Nasal Swab  Result Value Ref Range Status   MRSA, PCR NEGATIVE NEGATIVE Final   Staphylococcus aureus NEGATIVE NEGATIVE Final    Comment: (NOTE) The Xpert SA Assay (FDA approved for NASAL specimens in patients 62 years of age and older), is one component of a comprehensive surveillance program. It is not intended to diagnose infection nor to guide or monitor treatment. Performed at [facility], [address], [place], [place] [address]          Radiology Studies: DG Forearm Left  Result Date: 06/18/2021 CLINICAL  DATA:  Slipped and fell.  Forearm pain. EXAM: LEFT FOREARM - 2 VIEW COMPARISON:  None. FINDINGS: There is no evidence of fracture or other focal bone lesions. Soft tissues are unremarkable. IMPRESSION: Negative. Electronically Signed   By: Nelson Chimes M.D.   On: 06/18/2021 17:27   CT HEAD WO CONTRAST (5MM)  Result Date: 06/18/2021 CLINICAL DATA:  Slipped and fell onto the left side. Complaining of left hip and thigh pain and left arm pain. EXAM: CT HEAD WITHOUT CONTRAST TECHNIQUE: Contiguous axial images were obtained from the base of the skull through the vertex without intravenous contrast. RADIATION DOSE REDUCTION: This exam was performed according to the departmental dose-optimization program which  includes automated exposure control, adjustment of the mA and/or kV according to patient size and/or use of iterative reconstruction technique. COMPARISON:  04/25/2013. FINDINGS: Brain: No evidence of acute infarction, hemorrhage, hydrocephalus, extra-axial collection or mass lesion/mass effect. Mild periventricular white matter hypoattenuation posteriorly, consistent with chronic microvascular ischemic change. Vascular: No hyperdense vessel or unexpected calcification. Skull: Normal. Negative for fracture or focal lesion. Sinuses/Orbits: Globes and orbits are unremarkable. Visualized sinuses are clear. Other: None. IMPRESSION: 1. No acute intracranial abnormalities. Electronically Signed   By: Lajean Manes M.D.   On: 06/18/2021 18:21   CT Hip Left Wo Contrast  Result Date: 06/18/2021 CLINICAL DATA:  Fall. Left-sided hip pain. Femoral neck fracture on radiographs. EXAM: CT OF THE LEFT HIP WITHOUT CONTRAST TECHNIQUE: Multidetector CT imaging of the left hip was performed according to the standard protocol. Multiplanar CT image reconstructions were also generated. RADIATION DOSE REDUCTION: This exam was performed according to the departmental dose-optimization program which includes automated exposure control, adjustment of the mA and/or kV according to patient size and/or use of iterative reconstruction technique. COMPARISON:  Current left hip radiographs. FINDINGS: Bones/Joint/Cartilage Nondisplaced, non comminuted fracture of the left femoral neck. Fracture extends from the inferior medial aspect of the mid femoral neck to the superior subcapital portion. There is mild valgus angulation. There is also mild apex anterior angulation. No other fractures.  No bone lesions. Hip joint is normally spaced and aligned. Ligaments Suboptimally assessed by CT. Muscles and Tendons No muscle injury/hematoma.  Tendons are grossly intact. Soft tissues No soft tissue mass or contusion. No acute findings noted within the  visualized pelvis. IMPRESSION: 1. Nondisplaced, non comminuted fracture of the left femoral neck extending from the medial mid femoral neck to the superior subcapital portion, associated with mild valgus and apex anterior angulation. 2. No other fractures.  Normally aligned left hip joint. Electronically Signed   By: Lajean Manes M.D.   On: 06/18/2021 18:25   DG Hip Unilat W or Wo Pelvis 2-3 Views Left  Result Date: 06/18/2021 CLINICAL DATA:  Slipped and fell.  Left-sided pain. EXAM: DG HIP (WITH OR WITHOUT PELVIS) 2-3V LEFT COMPARISON:  CT abdomen 10/03/2016 FINDINGS: Suspicion of acute rami fractures on the left. Suspicion also of nondisplaced basicervical femoral neck fracture. Consider CT scan for confirmation. IMPRESSION: Nondisplaced basicervical fracture of the femoral neck. Suspicion also of superior and inferior rami fractures on the left. Electronically Signed   By: Nelson Chimes M.D.   On: 06/18/2021 17:29   DG Femur Min 2 Views Left  Result Date: 06/18/2021 CLINICAL DATA:  [place].  Left-sided pain. EXAM: LEFT FEMUR 2 VIEWS COMPARISON:  Hip imaging same day. FINDINGS: No evidence of femur fracture distal to the nondisplaced basicervical femoral neck fracture. IMPRESSION: Basicervical femoral neck fracture.  No fracture distal to that.  Electronically Signed   By: Nelson Chimes M.D.   On: 06/18/2021 17:30        Scheduled Meds:  carvedilol  18.75 mg Oral BID   Exenatide ER  2 mg Subcutaneous Weekly   fluticasone  2 spray Each Nare Daily   furosemide  20 mg Oral Daily   insulin glargine-yfgn  14 Units Subcutaneous QHS   loperamide HCl  1 mg Oral Daily   loratadine  10 mg Oral Daily   mupirocin ointment  1 application Nasal BID   rosuvastatin  20 mg Oral Daily   sacubitril-valsartan  1 tablet Oral BID   spironolactone  25 mg Oral Daily   tacrolimus   Topical BID   triamcinolone cream   Topical BID   Continuous Infusions:  sodium chloride 100 mL/hr at 06/18/21 2214    ceFAZolin  (ANCEF) IV       LOS: 1 day    Time spent: 32 mins     Wyvonnia Dusky, MD [facility] Pager [phone]  If 7PM-7AM, please contact night-coverage 06/19/2021, 7:35 AM

## 2021-06-19 NOTE — Anesthesia Procedure Notes (Signed)
Procedure Name: Intubation Date/Time: 06/19/2021 4:01 PM Performed by: Johnna Acosta, CRNA Pre-anesthesia Checklist: Patient identified, Emergency Drugs available, Suction available, Patient being monitored and Timeout performed Patient Re-evaluated:Patient Re-evaluated prior to induction Oxygen Delivery Method: Circle system utilized Preoxygenation: Pre-oxygenation with 100% oxygen Induction Type: IV induction Ventilation: Mask ventilation without difficulty Laryngoscope Size: McGraph and 3 Grade View: Grade I Tube type: Oral Tube size: 7.0 mm Number of attempts: 1 Airway Equipment and Method: Stylet and Video-laryngoscopy Placement Confirmation: ETT inserted through vocal cords under direct vision, positive ETCO2 and breath sounds checked- equal and bilateral Secured at: 20 cm Tube secured with: Tape Dental Injury: Teeth and Oropharynx as per pre-operative assessment  Difficulty Due To: Difficulty was anticipated, Difficult Airway- due to limited oral opening and Difficult Airway- due to large tongue

## 2021-06-19 NOTE — Consult Note (Signed)
ORTHOPAEDIC CONSULTATION  REQUESTING PHYSICIAN: Wyvonnia Dusky, MD  Chief Complaint: Left hip pain  HPI: Erin Hayes is a 56 y.o. female who complains of left hip pain after a mechanical fall. The pain is sharp in character. The pain is worse with movement and better with rest. Denies any numbness, tingling or constitutional symptoms.  The patient lives at home with her daughter.  She does not use any ambulatory assistive device.  She is not on any anticoagulation.  Past Medical History:  Diagnosis Date   Carpal tunnel syndrome of right wrist    Chronic combined systolic (congestive) and diastolic (congestive) heart failure ([place])    a. 03/2017 Echo: EF 20-25%, diff HK, Gr2 DD; b. 09/2017 Echo: EF 20-25%, diff HK, Gr2 DD.   Diabetes ([place])    GERD (gastroesophageal reflux disease)    HLD (hyperlipidemia)    Hypertension    NICM (nonischemic cardiomyopathy) ([place])    a. 03/2017 Echo: EF 20-25%, diff HK, Gr2 DD, mild to mod MR, nl RV fxn;  b. 03/2017 Cath: mild nonobs dzs, EF 25%; c. 09/2017 Echo: EF 20-25%, diff HK. Gr2 DD. Mild MR. Mildly reduced RV fxn.   Pleural effusion, left    a. 03/2017 s/p thoracentesis-->300 ml withdrawn--transudative.   Pneumonia 02/19/2017   Past Surgical History:  Procedure Laterality Date   COLONOSCOPY WITH PROPOFOL N/A 01/21/2017   Procedure: COLONOSCOPY WITH PROPOFOL;  Surgeon: Jonathon Bellows, MD;  Location: [facility] ENDOSCOPY;  Service: Gastroenterology;  Laterality: N/A;   ESOPHAGOGASTRODUODENOSCOPY (EGD) WITH PROPOFOL N/A 01/21/2017   Procedure: ESOPHAGOGASTRODUODENOSCOPY (EGD) WITH PROPOFOL;  Surgeon: Jonathon Bellows, MD;  Location: [facility] ENDOSCOPY;  Service: Gastroenterology;  Laterality: N/A;   FLEXIBLE SIGMOIDOSCOPY N/A 11/04/2016   Procedure: FLEXIBLE SIGMOIDOSCOPY;  Surgeon: Wilford Corner, MD;  Location: [facility] ENDOSCOPY;  Service: Endoscopy;  Laterality: N/A;   LEFT HEART CATH AND CORONARY ANGIOGRAPHY N/A 04/05/2017   Procedure: LEFT HEART CATH AND  CORONARY ANGIOGRAPHY;  Surgeon: Wellington Hampshire, MD;  Location: [place] CV LAB;  Service: Cardiovascular;  Laterality: N/A;   LITHOTRIPSY     Social History   Socioeconomic History   Marital status: Single    Spouse name: Not on file   Number of children: 3   Years of education: 104   Highest education level: 12th grade  Occupational History   Occupation: home maker  Tobacco Use   Smoking status: Never   Smokeless tobacco: Never  Vaping Use   Vaping Use: Never used  Substance and Sexual Activity   Alcohol use: No   Drug use: No   Sexual activity: Never  Other Topics Concern   Not on file  Social History Narrative   Not on file   Social Determinants of Health   Financial Resource Strain: Not on file  Food Insecurity: Not on file  Transportation Needs: Not on file  Physical Activity: Not on file  Stress: Not on file  Social Connections: Not on file   Family History  Problem Relation Age of Onset   Lung cancer Mother    Hypertension Father    CAD Father        a. MI age 36   Heart disease Father    COPD Neg Hx    Diabetes Mellitus II Neg Hx    No Known Allergies Prior to Admission medications   Medication Sig Start Date End Date Taking? Authorizing Provider  aspirin 81 MG tablet Take 81 mg by mouth daily.   Yes [provider]  carvedilol (COREG) 12.5 MG tablet Take 1.5 tablets (18.75 mg total) by mouth 2 (two) times daily. 05/20/20  Yes Minna Merritts, MD  Crisaborole (EUCRISA) 2 % OINT Apply to aa's BID. 01/31/21  Yes Ralene Bathe, MD  fluticasone [facility]) 50 MCG/ACT nasal spray Place 2 sprays into both nostrils daily. Use for 4-6 weeks then stop and use seasonally or as needed. 05/15/21  Yes Baity, Coralie Keens, NP  furosemide (LASIX) 20 MG tablet Take 1 tablet (20 mg total) by mouth daily. 11/26/19  Yes Malfi, Lupita Raider, FNP  ibuprofen (ADVIL) 400 MG tablet Take 400 mg by mouth 3 (three) times daily. 02/10/20  Yes [provider]  insulin  glargine (LANTUS) 100 UNIT/ML Solostar Pen Inject 14 Units into the skin at bedtime. 09/07/20  Yes [name], NP  loperamide (IMODIUM) 1 MG/5ML solution Take 5 mLs (1 mg total) by mouth daily. 09/21/20  Yes [name], NP  meloxicam (MOBIC) 15 MG tablet Take 1 tablet (15 mg total) by mouth daily. 01/12/20  Yes Edrick Kins, DPM  ondansetron (ZOFRAN) 4 MG tablet Take 1 tablet (4 mg total) by mouth every 8 (eight) hours as needed for nausea or vomiting. 09/21/20  Yes Baity, Coralie Keens, NP  rosuvastatin (CRESTOR) 20 MG tablet Take 1 tablet (20 mg total) by mouth daily. 05/20/20  Yes Gollan, Kathlene November, MD  sacubitril-valsartan (ENTRESTO) 49-51 MG Take 1 tablet by mouth 2 (two) times daily. 05/11/20  Yes Malfi, Lupita Raider, FNP  spironolactone (ALDACTONE) 25 MG tablet Take 1 tablet (25 mg total) by mouth daily. 05/11/20  Yes Malfi, Lupita Raider, FNP  tacrolimus (PROTOPIC) 0.1 % ointment Apply topically 2 (two) times daily. 02/14/21  Yes Ralene Bathe, MD  triamcinolone cream (KENALOG) 0.1 % Apply to aa's BID x 2 weeks. Then decrease use to BID 5d/wk. 11/30/20  Yes Ralene Bathe, MD  albuterol (VENTOLIN HFA) 108 (90 Base) MCG/ACT inhaler Inhale 2 puffs into the lungs every 4 (four) hours as needed for wheezing or shortness of breath. 11/26/19   Malfi, Lupita Raider, FNP  cetirizine (ZYRTEC) 10 MG tablet Take 1 tablet (10 mg total) by mouth daily. 05/15/21   [name], NP  Exenatide ER (BYDUREON BCISE) 2 MG/0.85ML AUIJ Inject 2 mg into the skin once a week. 11/26/19   Malfi, Lupita Raider, FNP  glucose blood (CONTOUR TEST) test strip by Other route Two (2) times a day. 05/13/13   [provider]  Insulin Pen Needle 31G X 5 MM MISC Use as directed with Lantus 11/26/19   Malfi, Lupita Raider, FNP  metFORMIN (GLUCOPHAGE) 500 MG tablet Take 1 tablet (500 mg total) by mouth 2 (two) times daily with a meal. Patient not taking: Reported on 06/18/2021 11/26/19   Verl Bangs, FNP   DG Forearm Left  Result Date:  06/18/2021 CLINICAL DATA:  Slipped and fell.  Forearm pain. EXAM: LEFT FOREARM - 2 VIEW COMPARISON:  None. FINDINGS: There is no evidence of fracture or other focal bone lesions. Soft tissues are unremarkable. IMPRESSION: Negative. Electronically Signed   By: Nelson Chimes M.D.   On: 06/18/2021 17:27   CT HEAD WO CONTRAST (5MM)  Result Date: 06/18/2021 CLINICAL DATA:  Slipped and fell onto the left side. Complaining of left hip and thigh pain and left arm pain. EXAM: CT HEAD WITHOUT CONTRAST TECHNIQUE: Contiguous axial images were obtained from the base of the skull through the vertex without intravenous contrast. RADIATION DOSE REDUCTION:  This exam was performed according to the departmental dose-optimization program which includes automated exposure control, adjustment of the mA and/or kV according to patient size and/or use of iterative reconstruction technique. COMPARISON:  04/25/2013. FINDINGS: Brain: No evidence of acute infarction, hemorrhage, hydrocephalus, extra-axial collection or mass lesion/mass effect. Mild periventricular white matter hypoattenuation posteriorly, consistent with chronic microvascular ischemic change. Vascular: No hyperdense vessel or unexpected calcification. Skull: Normal. Negative for fracture or focal lesion. Sinuses/Orbits: Globes and orbits are unremarkable. Visualized sinuses are clear. Other: None. IMPRESSION: 1. No acute intracranial abnormalities. Electronically Signed   By: Lajean Manes M.D.   On: 06/18/2021 18:21   CT Hip Left Wo Contrast  Result Date: 06/18/2021 CLINICAL DATA:  Fall. Left-sided hip pain. Femoral neck fracture on radiographs. EXAM: CT OF THE LEFT HIP WITHOUT CONTRAST TECHNIQUE: Multidetector CT imaging of the left hip was performed according to the standard protocol. Multiplanar CT image reconstructions were also generated. RADIATION DOSE REDUCTION: This exam was performed according to the departmental dose-optimization program which includes  automated exposure control, adjustment of the mA and/or kV according to patient size and/or use of iterative reconstruction technique. COMPARISON:  Current left hip radiographs. FINDINGS: Bones/Joint/Cartilage Nondisplaced, non comminuted fracture of the left femoral neck. Fracture extends from the inferior medial aspect of the mid femoral neck to the superior subcapital portion. There is mild valgus angulation. There is also mild apex anterior angulation. No other fractures.  No bone lesions. Hip joint is normally spaced and aligned. Ligaments Suboptimally assessed by CT. Muscles and Tendons No muscle injury/hematoma.  Tendons are grossly intact. Soft tissues No soft tissue mass or contusion. No acute findings noted within the visualized pelvis. IMPRESSION: 1. Nondisplaced, non comminuted fracture of the left femoral neck extending from the medial mid femoral neck to the superior subcapital portion, associated with mild valgus and apex anterior angulation. 2. No other fractures.  Normally aligned left hip joint. Electronically Signed   By: Lajean Manes M.D.   On: 06/18/2021 18:25   DG Hip Unilat W or Wo Pelvis 2-3 Views Left  Result Date: 06/18/2021 CLINICAL DATA:  Slipped and fell.  Left-sided pain. EXAM: DG HIP (WITH OR WITHOUT PELVIS) 2-3V LEFT COMPARISON:  CT abdomen 10/03/2016 FINDINGS: Suspicion of acute rami fractures on the left. Suspicion also of nondisplaced basicervical femoral neck fracture. Consider CT scan for confirmation. IMPRESSION: Nondisplaced basicervical fracture of the femoral neck. Suspicion also of superior and inferior rami fractures on the left. Electronically Signed   By: Nelson Chimes M.D.   On: 06/18/2021 17:29   DG Femur Min 2 Views Left  Result Date: 06/18/2021 CLINICAL DATA:  [place].  Left-sided pain. EXAM: LEFT FEMUR 2 VIEWS COMPARISON:  Hip imaging same day. FINDINGS: No evidence of femur fracture distal to the nondisplaced basicervical femoral neck fracture. IMPRESSION:  Basicervical femoral neck fracture.  No fracture distal to that. Electronically Signed   By: Nelson Chimes M.D.   On: 06/18/2021 17:30    Positive ROS: All other systems have been reviewed and were otherwise negative with the exception of those mentioned in the HPI and as above.  Physical Exam: General: Alert, no acute distress Cardiovascular: No pedal edema Respiratory: No cyanosis, no use of accessory musculature GI: No organomegaly, abdomen is soft and non-tender Skin: No lesions in the area of chief complaint Neurologic: Sensation intact distally Psychiatric: Patient is competent for consent with normal mood and affect Lymphatic: No axillary or cervical lymphadenopathy  MUSCULOSKELETAL:  Left lower extremity: Diffuse tenderness  to palpation about the hip.  No tenderness about the knee or ankle, dorsiflexes and plantar flexes at the ankle appropriately.  Grossly motor and sensory intact   Assessment: 56 year old female with multiple medical comorbidities admitted with a nondisplaced left femoral neck fracture  Plan: -Appreciate hospitalist team support/management -We had a long discussion regarding her fracture pattern as well as risks and benefits of operative and nonoperative treatment options.  Recommendation was made for percutaneous pinning of left femoral neck fracture.  All questions were answered.  Please keep n.p.o., will plan for surgery later this afternoon.    Renee Harder, MD    06/19/2021 7:45 AM

## 2021-06-20 ENCOUNTER — Encounter: Payer: Self-pay | Admitting: Orthopaedic Surgery

## 2021-06-20 DIAGNOSIS — S72002A Fracture of unspecified part of neck of left femur, initial encounter for closed fracture: Secondary | ICD-10-CM | POA: Diagnosis not present

## 2021-06-20 DIAGNOSIS — I10 Essential (primary) hypertension: Secondary | ICD-10-CM | POA: Diagnosis not present

## 2021-06-20 DIAGNOSIS — E1169 Type 2 diabetes mellitus with other specified complication: Secondary | ICD-10-CM | POA: Diagnosis not present

## 2021-06-20 LAB — BASIC METABOLIC PANEL
Anion gap: 8 (ref 5–15)
BUN: 23 mg/dL — ABNORMAL HIGH (ref 6–20)
CO2: 20 mmol/L — ABNORMAL LOW (ref 22–32)
Calcium: 7.8 mg/dL — ABNORMAL LOW (ref 8.9–10.3)
Chloride: 110 mmol/L (ref 98–111)
Creatinine, Ser: 0.96 mg/dL (ref 0.44–1.00)
GFR, Estimated: >60 mL/min (ref >60)
Glucose, Bld: 239 mg/dL — ABNORMAL HIGH (ref 70–99)
Potassium: 3.9 mmol/L (ref 3.5–5.1)
Sodium: 138 mmol/L (ref 135–145)

## 2021-06-20 LAB — CBC
HCT: 35.5 % — ABNORMAL LOW (ref 36.0–46.0)
Hemoglobin: 11.3 g/dL — ABNORMAL LOW (ref 12.0–15.0)
MCH: 28.1 pg (ref 26.0–34.0)
MCHC: 31.8 g/dL (ref 30.0–36.0)
MCV: 88.3 fL (ref 80.0–100.0)
Platelets: 262 10*3/uL (ref 150–400)
RBC: 4.02 MIL/uL (ref 3.87–5.11)
RDW: 13.5 % (ref 11.5–15.5)
WBC: 10.7 10*3/uL — ABNORMAL HIGH (ref 4.0–10.5)
nRBC: 0 % (ref 0.0–0.2)

## 2021-06-20 LAB — GLUCOSE, CAPILLARY
Glucose-Capillary: 260 mg/dL — ABNORMAL HIGH (ref 70–99)
Glucose-Capillary: 193 mg/dL — ABNORMAL HIGH (ref 70–99)
Glucose-Capillary: 205 mg/dL — ABNORMAL HIGH (ref 70–99)
Glucose-Capillary: 113 mg/dL — ABNORMAL HIGH (ref 70–99)
Glucose-Capillary: 142 mg/dL — ABNORMAL HIGH (ref 70–99)

## 2021-06-20 NOTE — Evaluation (Signed)
Occupational Therapy Evaluation Patient Details Name: Erin Hayes MRN: [id number] DOB: [birth year] Today's Date: 06/20/2021   History of Present Illness Erin Hayes is a 56 y.o. African-American female with medical history significant for chronic combined systolic and diastolic CHF, type 2 diabetes mellitus, GERD, dyslipidemia, hypertension and nonischemic cardiomyopathy, who presented to the ER with acute onset of left hip pain. Found to have nondisplaced left femoral neck fracture now s/p Left femoral neck FNS on 06/19/21.   Clinical Impression   [name] was seen for OT evaluation this date. 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 Pt lives with daughter and 35 month old granddaughter in mobile home with ramped entrance. Pt presents to acute OT demonstrating impaired ADL performance and functional mobility 2/2 decreased activity tolerance and functional strength/ROM/balance deficits.   Pt currently requires MIN A exit L side of bed, assist for LLE mgmt. Upon sitting pt noted to have had loose BM. MIN A + RW for BSC t/f, SETUP + SUPERVISION for perihygeien in sitting. MAX A for LBD seated EOB. Pt tolerated bed>BSC>chair with cues for safety, elects to maintain LLE NWBing. Pt would benefit from skilled OT to address noted impairments and functional limitations (see below for any additional details). Upon hospital discharge, recommend STR to maximize pt safety and return to PLOF.      Recommendations for follow up therapy are one component of a multi-disciplinary discharge planning process, led by the attending physician.  Recommendations may be updated based on patient status, additional functional criteria and insurance authorization.   Follow Up Recommendations  Skilled nursing-short term rehab (<3 hours/day)    Assistance Recommended at Discharge Frequent or constant  Supervision/Assistance  Patient can return home with the following A little help with walking and/or transfers;A little help with bathing/dressing/bathroom;Help with stairs or ramp for entrance;Assistance with cooking/housework    Functional Status Assessment  Patient has had a recent decline in their functional status and demonstrates the ability to make significant improvements in function in a reasonable and predictable amount of time.  Equipment Recommendations  BSC/3in1    Recommendations for Other Services       Precautions / Restrictions Precautions Precautions: Fall Restrictions Weight Bearing Restrictions: Yes LLE Weight Bearing: Touchdown weight bearing      Mobility Bed Mobility Overal bed mobility: Needs Assistance Bed Mobility: Supine to Sit     Supine to sit: Min assist, HOB elevated     General bed mobility comments: assist for LLE mgmt    Transfers Overall transfer level: Needs assistance Equipment used: Rolling walker (2 wheels) Transfers: Sit to/from Stand, Bed to chair/wheelchair/BSC Sit to Stand: Min assist, From elevated surface     Step pivot transfers: Min assist     General transfer comment: elects to maintain NWBing      Balance Overall balance assessment: Needs assistance Sitting-balance support: No upper extremity supported, Feet supported Sitting balance-Leahy Scale: Good     Standing balance support: Bilateral upper extremity supported, During functional activity Standing balance-Leahy Scale: Poor                             ADL either performed or assessed with clinical judgement   ADL Overall ADL's : Needs assistance/impaired  General ADL Comments: MIN A + RW for BSC t/f, SETUP + SUPERVISION for perihygeien in sitting. MAX A for LBD seated EOB.      Pertinent Vitals/Pain Pain Assessment Pain Assessment: 0-10 Pain Score: 4  Pain Location: L hip Pain  Descriptors / Indicators: Discomfort, Dull Pain Intervention(s): Limited activity within patient's tolerance, Repositioned     Hand Dominance Right   Extremity/Trunk Assessment Upper Extremity Assessment Upper Extremity Assessment: Overall WFL for tasks assessed   Lower Extremity Assessment Lower Extremity Assessment: Generalized weakness       Communication Communication Communication: No difficulties   Cognition Arousal/Alertness: Awake/alert Behavior During Therapy: WFL for tasks assessed/performed Overall Cognitive Status: Within Functional Limits for tasks assessed                                        Home Living Family/patient expects to be discharged to:: Private residence Living Arrangements: Children (daughter and 55 month old granddaughter) Available Help at Discharge: Family;Available PRN/intermittently Type of Home: Mobile home Home Access: Ramped entrance     Home Layout: One level     Bathroom Shower/Tub: Walk-in shower;Tub/shower unit         Home Equipment: None          Prior Functioning/Environment Prior Level of Function : Independent/Modified Independent;Working/employed;Driving               ADLs Comments: works at a daycare        OT Problem List: Decreased strength;Decreased range of motion;Decreased activity tolerance;Impaired balance (sitting and/or standing);Decreased safety awareness      OT Treatment/Interventions: Self-care/ADL training;Therapeutic exercise;Energy conservation;DME and/or AE instruction;Therapeutic activities;Patient/family education;Balance training    OT Goals(Current goals can be found in the care plan section) Acute Rehab OT Goals Patient Stated Goal: to return to PLOF OT Goal Formulation: With patient Time For Goal Achievement: 07/04/21 Potential to Achieve Goals: Good ADL Goals Pt Will Perform Grooming: with set-up;with supervision;standing Pt Will Perform Lower Body Dressing:  with min guard assist;sit to/from stand Pt Will Transfer to Toilet: with set-up;with supervision;ambulating;regular height toilet  OT Frequency: Min 3X/week    Co-evaluation              AM-PAC OT "6 Clicks" Daily Activity     Outcome Measure Help from another person eating meals?: None Help from another person taking care of personal grooming?: A Little Help from another person toileting, which includes using toliet, bedpan, or urinal?: A Little Help from another person bathing (including washing, rinsing, drying)?: A Lot Help from another person to put on and taking off regular upper body clothing?: A Little Help from another person to put on and taking off regular lower body clothing?: A Lot 6 Click Score: 17   End of Session Equipment Utilized During Treatment: Gait belt;Rolling walker (2 wheels)  Activity Tolerance: Patient tolerated treatment well Patient left: in chair;with call bell/phone within reach;with chair alarm set  OT Visit Diagnosis: Other abnormalities of gait and mobility (R26.89);Muscle weakness (generalized) (M62.81)                Time: 9735-3299 OT Time Calculation (min): 28 min Charges:  OT General Charges $OT Visit: 1 Visit OT Evaluation $OT Eval Low Complexity: 1 Low OT Treatments $Self Care/Home Management : 8-22 mins  Dessie Coma, M.S. OTR/L  06/20/21, 11:11 AM  ascom [phone]

## 2021-06-20 NOTE — NC FL2 (Signed)
[place] LEVEL OF CARE SCREENING TOOL     IDENTIFICATION  Patient Name: Erin Hayes Birthdate: 02-06-1966 Sex: female Admission Date (Current Location): 06/18/2021  [facility] and [place] Number:  Engineering geologist and Address:  [facility], [address] [place], [place] [address]      Provider Number: [id number]  Attending Physician Name and Address:  Wyvonnia Dusky, MD  Relative Name and Phone Number:  Luetta Nutting [phone]    Current Level of Care: Hospital Recommended Level of Care: [place] Prior Approval Number:    Date Approved/Denied:   PASRR Number: [id number]  Discharge Plan: SNF    Current Diagnoses: Patient Active Problem List   Diagnosis Date Noted   Closed left hip fracture ([place]) 06/18/2021   NICM (nonischemic cardiomyopathy) ([place]) 08/22/2017   CHF (congestive heart failure) ([place]) 04/03/2017   DM (diabetes mellitus), type 2 ([place])    Essential hypertension    Hypercholesterolemia 11/10/2012    Orientation RESPIRATION BLADDER Height & Weight     Self, Time, Situation, Place  Normal Continent Weight: 54.4 kg Height:  4\' 11"  (149.9 cm)  BEHAVIORAL SYMPTOMS/MOOD NEUROLOGICAL BOWEL NUTRITION STATUS      Continent Diet (regular)  AMBULATORY STATUS COMMUNICATION OF NEEDS Skin   Extensive Assist Verbally Normal, Surgical wounds                       Personal Care Assistance Level of Assistance  Bathing, Feeding, Dressing Bathing Assistance: Limited assistance Feeding assistance: Independent Dressing Assistance: Limited assistance     Functional Limitations Info             SPECIAL CARE FACTORS FREQUENCY  PT (By licensed PT), OT (By licensed OT)     PT Frequency: 5 times per week OT Frequency: 5 times per week            Contractures Contractures Info: Not present    Additional Factors Info  Code Status, Allergies Code Status Info: full code Allergies Info: nkda            Current Medications (06/20/2021):  This is the current hospital active medication list Current Facility-Administered Medications  Medication Dose Route Frequency Provider Last Rate Last Admin   0.9 %  sodium chloride infusion   Intravenous Continuous Renee Harder, MD 50 mL/hr at 06/20/21 0703 New Bag at 06/20/21 0703   albuterol (PROVENTIL) (2.5 MG/3ML) 0.083% nebulizer solution 2.5 mg  2.5 mg Inhalation Q4H PRN Renee Harder, MD       carvedilol (COREG) tablet 18.75 mg  18.75 mg Oral BID Renee Harder, MD   18.75 mg at 06/20/21 0904   enoxaparin (LOVENOX) injection 40 mg  40 mg Subcutaneous Q24H Renee Harder, MD   40 mg at 06/20/21 0826   feeding supplement (GLUCERNA SHAKE) (GLUCERNA SHAKE) liquid 237 mL  237 mL Oral TID BM Renee Harder, MD   237 mL at 06/20/21 1611   fentaNYL (SUBLIMAZE) injection 12.5 mcg  12.5 mcg Intravenous Q4H PRN Renee Harder, MD       fluticasone Asencion Islam) 50 MCG/ACT nasal spray 2 spray  2 spray Each Nare Daily Renee Harder, MD   2 spray at 06/18/21 2159   furosemide (LASIX) tablet 20 mg  20 mg Oral Daily Renee Harder, MD   20 mg at 06/20/21 8101   HYDROcodone-acetaminophen (NORCO/VICODIN) 5-325 MG per tablet 1-2 tablet  1-2 tablet Oral Q6H PRN Renee Harder, MD   2 tablet at  06/18/21 2201   insulin aspart (novoLOG) injection 0-6 Units  0-6 Units Subcutaneous Q4H Renee Harder, MD   2 Units at 06/20/21 1151   insulin glargine-yfgn (SEMGLEE) injection 14 Units  14 Units Subcutaneous Dewain Penning, MD   14 Units at 06/19/21 2215   labetalol (NORMODYNE) injection 20 mg  20 mg Intravenous Q3H PRN Renee Harder, MD       loperamide HCl (IMODIUM) 1 MG/7.5ML suspension 1 mg  1 mg Oral Daily Renee Harder, MD   1 mg at 06/20/21 8563   loratadine (CLARITIN) tablet 10 mg  10 mg Oral Daily Renee Harder, MD   10 mg at 06/20/21 1497   morphine (PF) 2 MG/ML injection 0.5 mg  0.5 mg Intravenous Q2H PRN  Renee Harder, MD       multivitamin with minerals tablet 1 tablet  1 tablet Oral Daily Renee Harder, MD   1 tablet at 06/20/21 0263   mupirocin ointment (BACTROBAN) 2 % 1 application  1 application Nasal BID Renee Harder, MD   1 application at 78/58/85 2217   ondansetron [facility]) tablet 4 mg  4 mg Oral Q8H PRN Renee Harder, MD       oxyCODONE (Oxy IR/ROXICODONE) immediate release tablet 5-10 mg  5-10 mg Oral Q4H PRN Renee Harder, MD   5 mg at 06/20/21 1544   rosuvastatin (CRESTOR) tablet 20 mg  20 mg Oral Daily Renee Harder, MD   20 mg at 06/20/21 0277   sacubitril-valsartan (ENTRESTO) 49-51 mg per tablet  1 tablet Oral BID Renee Harder, MD   1 tablet at 06/20/21 4128   spironolactone (ALDACTONE) tablet 25 mg  25 mg Oral Daily Renee Harder, MD   25 mg at 06/20/21 7867   tacrolimus (PROTOPIC) 0.1 % ointment   Topical BID Renee Harder, MD       traZODone (DESYREL) tablet 25 mg  25 mg Oral QHS PRN Renee Harder, MD       triamcinolone cream (KENALOG) 0.1 % cream   Topical BID Renee Harder, MD   Given at 06/19/21 2218     Discharge Medications: Please see discharge summary for a list of discharge medications.  Relevant Imaging Results:  Relevant Lab Results:   Additional Information SS# [id number]  Conception Oms, RN

## 2021-06-20 NOTE — Progress Notes (Addendum)
PROGRESS NOTE   HPI was taken from Dr. Sidney Ace: Erin Hayes is a 56 y.o. African-American female with medical history significant for chronic combined systolic and diastolic CHF, type 2 diabetes mellitus, GERD, dyslipidemia, hypertension and nonischemic cardiomyopathy, who presented to the ER with acute onset of left hip pain.  The patient stated that she was going out of the door when her walking boot got caught and she fell on her left side, with subsequent left hip pain and inability to ambulate.  She had a left foot fracture about a year ago that healed and then she fell on it with subsequent swelling close to 3 months ago when she started wearing a boot on her left foot.  She denies any paresthesias or focal muscle weakness.  She denied any chest pain or palpitations.  No headache or dizziness or blurred vision.  No presyncope or syncope.  No dysuria, oliguria or hematuria or flank pain.  No cough or wheezing or hemoptysis.  ED Course: When she came to the ER BP was 181/112 with temperature of 99 and otherwise normal vital signs.  Labs revealed borderline potassium of 3.5 and a calcium of 8.7.  Hemoglobin was 11.9 hematocrit 37.8.  Respiratory panel is currently pending.   EKG as reviewed by me : Pending Imaging: Noncontrasted head CT scan revealed no acute intracranial normalities.  2 view left femur x-ray showed basicervical femoral neck fracture.  Left forearm x-ray was negative. Left hip CT showed nondisplaced, noncomminuted fracture of the left femoral neck extending from medial mid femoral neck to the superior subcapital portion associated with mild valgus and apex anterior angulation.  It showed no other fractures and normally aligned left hip joint.  The patient was given 4 mg of IV morphine sulfate and 4 mg of IV Zofran as well as 10 mg of IV Reglan and 50 mcg of IV fentanyl.  She will be admitted to a medical-surgical bed for further evaluation and management.   MADISSON KULAGA   IEP:[id number] DOB: 12/06/1965 DOA: 06/18/2021 PCP: [name], NP   Assessment & Plan:   Principal Problem:   Closed left hip fracture ([place])   Closed left hip fracture: secondary to mechanical fall. S/p left femoral neck FNS on 06/19/21 as per ortho surg. Oxycodone, morphine prn for pain. PT/OT consulted   HTN urgency: urgency resolved but still w/ uncontrolled HTN. Continue on entresto, aldactone & carvedilol. IV labetalol prn    DM2: poorly controlled. Continue on glargine, SSI w/ accuchecks   HLD: continue on statin   Chronic combined CHF: appears compensated. Continue on aldactone, coreg, & entresto. Monitor I/Os    DVT prophylaxis: lovenox  Code Status: full  Family Communication:  Disposition Plan: depends on PT/OT recs   Level of care: Med-Surg  Status is: Inpatient Remains inpatient appropriate because: severity of illness, PT/OT need to see pt. Will likely need SNF     Consultants:  Ortho surg   Procedures:   Antimicrobials:   Subjective: Pt c/o fatigue    Objective: Vitals:   06/19/21 1800 06/19/21 1834 06/19/21 2232 06/20/21 0443  BP:  123/81 (!) 125/93 136/88  Pulse: 80 85 85 79  Resp: 16 18 16 16   Temp: 98.5 F (36.9 C) 98.5 F (36.9 C) 97.9 F (36.6 C) 98.1 F (36.7 C)  TempSrc:  Oral  Oral  SpO2: 98% 98% 91% 98%  Weight:      Height:        Intake/Output Summary (Last  24 hours) at 06/20/2021 0746 Last data filed at 06/20/2021 0700 Gross per 24 hour  Intake 1487 ml  Output 520 ml  Net 967 ml   Filed Weights   06/18/21 1646 06/18/21 2000 06/19/21 1413  Weight: 54.4 kg 53.5 kg 54.4 kg    Examination:  General exam: Appears comfortable  Respiratory system: clear breath sounds b/l Cardiovascular system: S1/S2+. No gallops or rubs  Gastrointestinal system: Abd is soft, NT, ND & normal bowel sounds Central nervous system: Alert and oriented. Moves all extremities  Psychiatry: Judgement and insight appears normal. Appropriate mood  and affect     Data Reviewed: I have personally reviewed following labs and imaging studies  CBC: Recent Labs  Lab 06/18/21 1747 06/19/21 0623 06/20/21 0602  WBC 7.8 8.3 10.7*  NEUTROABS 5.3  --   --   HGB 11.9* 10.5* 11.3*  HCT 37.8 33.6* 35.5*  MCV 86.7 88.2 88.3  PLT 338 252 161   Basic Metabolic Panel: Recent Labs  Lab 06/18/21 1747 06/19/21 0623 06/20/21 0602  NA 136 137 138  K 3.5 3.7 3.9  CL 102 109 110  CO2 26 23 20*  GLUCOSE 122* 135* 239*  BUN 20 19 23*  CREATININE 0.94 0.89 0.96  CALCIUM 8.7* 7.8* 7.8*   GFR: Estimated Creatinine Clearance: 48.7 mL/min (by C-G formula based on SCr of 0.96 mg/dL). Liver Function Tests: Recent Labs  Lab 06/18/21 1747  AST 19  ALT 16  ALKPHOS 97  BILITOT 0.8  PROT 7.0  ALBUMIN 3.8   No results for input(s): LIPASE, AMYLASE in the last 168 hours. No results for input(s): AMMONIA in the last 168 hours. Coagulation Profile: No results for input(s): INR, PROTIME in the last 168 hours. Cardiac Enzymes: No results for input(s): CKTOTAL, CKMB, CKMBINDEX, TROPONINI in the last 168 hours. BNP (last 3 results) No results for input(s): PROBNP in the last 8760 hours. HbA1C: No results for input(s): HGBA1C in the last 72 hours. CBG: Recent Labs  Lab 06/19/21 1724 06/19/21 1753 06/19/21 2020 06/19/21 2329 06/20/21 0445  GLUCAP 73 91 222* 249* 260*   Lipid Profile: No results for input(s): CHOL, HDL, LDLCALC, TRIG, CHOLHDL, LDLDIRECT in the last 72 hours. Thyroid Function Tests: No results for input(s): TSH, T4TOTAL, FREET4, T3FREE, THYROIDAB in the last 72 hours. Anemia Panel: No results for input(s): VITAMINB12, FOLATE, FERRITIN, TIBC, IRON, RETICCTPCT in the last 72 hours. Sepsis Labs: No results for input(s): PROCALCITON, LATICACIDVEN in the last 168 hours.  Recent Results (from the past 240 hour(s))  Resp Panel by RT-PCR (Flu A&B, Covid) Nasopharyngeal Swab     Status: None   Collection Time: 06/18/21  7:50  PM   Specimen: Nasopharyngeal Swab; Nasopharyngeal(NP) swabs in vial transport medium  Result Value Ref Range Status   SARS Coronavirus 2 by RT PCR NEGATIVE NEGATIVE Final    Comment: (NOTE) SARS-CoV-2 target nucleic acids are NOT DETECTED.  The SARS-CoV-2 RNA is generally detectable in upper respiratory specimens during the acute phase of infection. The lowest concentration of SARS-CoV-2 viral copies this assay can detect is 138 copies/mL. A negative result does not preclude SARS-Cov-2 infection and should not be used as the sole basis for treatment or other patient management decisions. A negative result may occur with  improper specimen collection/handling, submission of specimen other than nasopharyngeal swab, presence of viral mutation(s) within the areas targeted by this assay, and inadequate number of viral copies(<138 copies/mL). A negative result must be combined with clinical observations, patient  history, and epidemiological information. The expected result is Negative.  Fact Sheet for Patients:  EntrepreneurPulse.com.au  Fact Sheet for Healthcare Providers:  IncredibleEmployment.be  This test is no t yet approved or cleared by the [place] FDA and  has been authorized for detection and/or diagnosis of SARS-CoV-2 by FDA under an Emergency Use Authorization (EUA). This EUA will remain  in effect (meaning this test can be used) for the duration of the COVID-19 declaration under Section 564(b)(1) of the Act, 21 U.S.C.section 360bbb-3(b)(1), unless the authorization is terminated  or revoked sooner.       Influenza A by PCR NEGATIVE NEGATIVE Final   Influenza B by PCR NEGATIVE NEGATIVE Final    Comment: (NOTE) The Xpert Xpress SARS-CoV-2/FLU/RSV plus assay is intended as an aid in the diagnosis of influenza from Nasopharyngeal swab specimens and should not be used as a sole basis for treatment. Nasal washings and aspirates are  unacceptable for Xpert Xpress SARS-CoV-2/FLU/RSV testing.  Fact Sheet for Patients: EntrepreneurPulse.com.au  Fact Sheet for Healthcare Providers: IncredibleEmployment.be  This test is not yet approved or cleared by the [place] FDA and has been authorized for detection and/or diagnosis of SARS-CoV-2 by FDA under an Emergency Use Authorization (EUA). This EUA will remain in effect (meaning this test can be used) for the duration of the COVID-19 declaration under Section 564(b)(1) of the Act, 21 U.S.C. section 360bbb-3(b)(1), unless the authorization is terminated or revoked.  Performed at [facility], [address] [place], [place] [address]   Surgical PCR screen     Status: None   Collection Time: 06/18/21  8:39 PM   Specimen: Nasal Mucosa; Nasal Swab  Result Value Ref Range Status   MRSA, PCR NEGATIVE NEGATIVE Final   Staphylococcus aureus NEGATIVE NEGATIVE Final    Comment: (NOTE) The Xpert SA Assay (FDA approved for NASAL specimens in patients 27 years of age and older), is one component of a comprehensive surveillance program. It is not intended to diagnose infection nor to guide or monitor treatment. Performed at [facility], [address] [place], [place] [address]          Radiology Studies: DG Forearm Left  Result Date: 06/18/2021 CLINICAL DATA:  Slipped and fell.  Forearm pain. EXAM: LEFT FOREARM - 2 VIEW COMPARISON:  None. FINDINGS: There is no evidence of fracture or other focal bone lesions. Soft tissues are unremarkable. IMPRESSION: Negative. Electronically Signed   By: Nelson Chimes M.D.   On: 06/18/2021 17:27   CT HEAD WO CONTRAST (5MM)  Result Date: 06/18/2021 CLINICAL DATA:  Slipped and fell onto the left side. Complaining of left hip and thigh pain and left arm pain. EXAM: CT HEAD WITHOUT CONTRAST TECHNIQUE: Contiguous axial images were obtained from the base of the skull through the  vertex without intravenous contrast. RADIATION DOSE REDUCTION: This exam was performed according to the departmental dose-optimization program which includes automated exposure control, adjustment of the mA and/or kV according to patient size and/or use of iterative reconstruction technique. COMPARISON:  04/25/2013. FINDINGS: Brain: No evidence of acute infarction, hemorrhage, hydrocephalus, extra-axial collection or mass lesion/mass effect. Mild periventricular white matter hypoattenuation posteriorly, consistent with chronic microvascular ischemic change. Vascular: No hyperdense vessel or unexpected calcification. Skull: Normal. Negative for fracture or focal lesion. Sinuses/Orbits: Globes and orbits are unremarkable. Visualized sinuses are clear. Other: None. IMPRESSION: 1. No acute intracranial abnormalities. Electronically Signed   By: Lajean Manes M.D.   On: 06/18/2021 18:21   CT Hip  Left Wo Contrast  Result Date: 06/18/2021 CLINICAL DATA:  Fall. Left-sided hip pain. Femoral neck fracture on radiographs. EXAM: CT OF THE LEFT HIP WITHOUT CONTRAST TECHNIQUE: Multidetector CT imaging of the left hip was performed according to the standard protocol. Multiplanar CT image reconstructions were also generated. RADIATION DOSE REDUCTION: This exam was performed according to the departmental dose-optimization program which includes automated exposure control, adjustment of the mA and/or kV according to patient size and/or use of iterative reconstruction technique. COMPARISON:  Current left hip radiographs. FINDINGS: Bones/Joint/Cartilage Nondisplaced, non comminuted fracture of the left femoral neck. Fracture extends from the inferior medial aspect of the mid femoral neck to the superior subcapital portion. There is mild valgus angulation. There is also mild apex anterior angulation. No other fractures.  No bone lesions. Hip joint is normally spaced and aligned. Ligaments Suboptimally assessed by CT. Muscles and  Tendons No muscle injury/hematoma.  Tendons are grossly intact. Soft tissues No soft tissue mass or contusion. No acute findings noted within the visualized pelvis. IMPRESSION: 1. Nondisplaced, non comminuted fracture of the left femoral neck extending from the medial mid femoral neck to the superior subcapital portion, associated with mild valgus and apex anterior angulation. 2. No other fractures.  Normally aligned left hip joint. Electronically Signed   By: Lajean Manes M.D.   On: 06/18/2021 18:25   DG C-Arm 1-60 Min-No Report  Result Date: 06/19/2021 Fluoroscopy was utilized by the requesting physician.  No radiographic interpretation.   DG HIP UNILAT WITH PELVIS 2-3 VIEWS LEFT  Result Date: 06/19/2021 CLINICAL DATA:  Left femoral neck nondisplaced fracture EXAM: DG HIP (WITH OR WITHOUT PELVIS) 2-3V LEFT COMPARISON:  06/18/2021 FINDINGS: Spot fluoroscopic views demonstrate operative fixation hardware traversing the intertrochanteric region into the femoral head. Anatomic alignment. No complicating feature. Expected postoperative changes. IMPRESSION: Status post ORIF of the left femoral neck fracture. Anatomic alignment. Electronically Signed   By: Jerilynn Mages.  Shick M.D.   On: 06/19/2021 17:08   DG Hip Unilat W or Wo Pelvis 2-3 Views Left  Result Date: 06/18/2021 CLINICAL DATA:  Slipped and fell.  Left-sided pain. EXAM: DG HIP (WITH OR WITHOUT PELVIS) 2-3V LEFT COMPARISON:  CT abdomen 10/03/2016 FINDINGS: Suspicion of acute rami fractures on the left. Suspicion also of nondisplaced basicervical femoral neck fracture. Consider CT scan for confirmation. IMPRESSION: Nondisplaced basicervical fracture of the femoral neck. Suspicion also of superior and inferior rami fractures on the left. Electronically Signed   By: Nelson Chimes M.D.   On: 06/18/2021 17:29   DG Femur Min 2 Views Left  Result Date: 06/18/2021 CLINICAL DATA:  [place].  Left-sided pain. EXAM: LEFT FEMUR 2 VIEWS COMPARISON:  Hip imaging same day.  FINDINGS: No evidence of femur fracture distal to the nondisplaced basicervical femoral neck fracture. IMPRESSION: Basicervical femoral neck fracture.  No fracture distal to that. Electronically Signed   By: Nelson Chimes M.D.   On: 06/18/2021 17:30        Scheduled Meds:  carvedilol  18.75 mg Oral BID   enoxaparin (LOVENOX) injection  40 mg Subcutaneous Q24H   feeding supplement (GLUCERNA SHAKE)  237 mL Oral TID BM   fluticasone  2 spray Each Nare Daily   furosemide  20 mg Oral Daily   insulin aspart  0-6 Units Subcutaneous Q4H   insulin glargine-yfgn  14 Units Subcutaneous QHS   loperamide HCl  1 mg Oral Daily   loratadine  10 mg Oral Daily   multivitamin with minerals  1 tablet Oral  Daily   mupirocin ointment  1 application Nasal BID   rosuvastatin  20 mg Oral Daily   sacubitril-valsartan  1 tablet Oral BID   spironolactone  25 mg Oral Daily   tacrolimus   Topical BID   triamcinolone cream   Topical BID   Continuous Infusions:  sodium chloride 50 mL/hr at 06/20/21 0703    ceFAZolin (ANCEF) IV 1 g (06/20/21 0612)     LOS: 2 days    Time spent: 25 mins     Wyvonnia Dusky, MD [facility] Pager [phone]  If 7PM-7AM, please contact night-coverage 06/20/2021, 7:46 AM

## 2021-06-20 NOTE — Evaluation (Signed)
Physical Therapy Evaluation Patient Details Name: Erin Hayes MRN: [id number] DOB: 04-30-1966 Today's Date: 06/20/2021  History of Present Illness  Pt is a 56 y.o. female c/o L hip pain s/p trip and fall on boot on L foot; pt noted to have L femoral neck fx.  (-) L forearm fx.  Per chart "She had a left foot fracture about a year ago that healed and then she fell on it with subsequent swelling close to 3 months ago when she started wearing a boot on her left foot".  S/p L hip cannulated pinning 06/19/21.  PMH includes DM, htn, heart disease, recent L foot fx, CHF, and R carpal tunnel syndrome.  Clinical Impression  Prior to hospital admission, pt was independent with functional mobility (most recently using CAM boot L foot--has an appt scheduled for 2/16 regarding this injury); lives with daughter and 24 month old granddaughter in 1 level home with ramp to enter.  Currently pt is min assist with transfers and to ambulate 20 feet with RW (pt keeping L LE NWB'ing during sessions activities).  0/10 L hip pain at rest beginning of session and 2/10 at rest end of session.  Pt would benefit from skilled PT to address noted impairments and functional limitations (see below for any additional details).  Upon hospital discharge, pt would benefit from SNF.    Recommendations for follow up therapy are one component of a multi-disciplinary discharge planning process, led by the attending physician.  Recommendations may be updated based on patient status, additional functional criteria and insurance authorization.  Follow Up Recommendations Skilled nursing-short term rehab (<3 hours/day)    Assistance Recommended at Discharge Frequent or constant Supervision/Assistance  Patient can return home with the following  A little help with walking and/or transfers;A little help with bathing/dressing/bathroom;Assistance with cooking/housework;Assist for transportation;Help with stairs or ramp for entrance     Equipment Recommendations Rolling walker (2 wheels);BSC/3in1;Wheelchair (measurements PT);Wheelchair cushion (measurements PT) (youth sized)  Recommendations for Other Services  OT consult    Functional Status Assessment Patient has had a recent decline in their functional status and demonstrates the ability to make significant improvements in function in a reasonable and predictable amount of time.     Precautions / Restrictions Precautions Precautions: Fall Restrictions Weight Bearing Restrictions: Yes LLE Weight Bearing: Touchdown weight bearing Other Position/Activity Restrictions: TTWB L LE      Mobility  Bed Mobility               General bed mobility comments: Deferred (pt in recliner beginning/end of session)    Transfers Overall transfer level: Needs assistance Equipment used: Rolling walker (2 wheels) Transfers: Sit to/from Stand Sit to Stand: Min assist           General transfer comment: vc's for UE/LE placement, overall technique, and L LE WB'ing status (pt kept L LE NWB'ing)    Ambulation/Gait Ambulation/Gait assistance: Min assist Gait Distance (Feet): 20 Feet Assistive device: Rolling walker (2 wheels) (youth sized)   Gait velocity: decreased     General Gait Details: initial assist for L LE NWB'ing status (pt wanting to keep NWB"ing); vc's for walker use and overall technique  Stairs            Wheelchair Mobility    Modified Rankin (Stroke Patients Only)       Balance Overall balance assessment: Needs assistance Sitting-balance support: No upper extremity supported, Feet supported Sitting balance-Leahy Scale: Good Sitting balance - Comments: steady sitting reaching within BOS  Standing balance support: Bilateral upper extremity supported, During functional activity Standing balance-Leahy Scale: Poor Standing balance comment: pt requiring assist for balance using walker                             Pertinent  Vitals/Pain Pain Assessment Pain Assessment: 0-10 Pain Score: 2  Pain Location: L hip Pain Descriptors / Indicators: Discomfort, Dull Pain Intervention(s): Limited activity within patient's tolerance, Monitored during session, Premedicated before session, Repositioned Vitals (HR and O2 on room air) stable and WFL throughout treatment session.    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, newspaper        Extremity/Trunk Assessment   Upper Extremity Assessment Upper Extremity Assessment: Overall WFL for tasks assessed    Lower Extremity Assessment Lower Extremity Assessment: Generalized weakness (at least 3/5 AROM R hip flexion, B knee flexion/extension, and DF/PF B; at least 3-/5 L hip flexion (limited d/t pain))    Cervical / Trunk Assessment Cervical / Trunk Assessment: Normal  Communication   Communication: No difficulties  Cognition Arousal/Alertness: Awake/alert Behavior During Therapy: WFL for tasks assessed/performed Overall Cognitive Status: Within Functional Limits for tasks assessed                                          General Comments  Nursing cleared pt for participation in physical therapy.  Pt agreeable to PT session.    Exercises Total Joint Exercises Quad Sets: AROM, Strengthening, Both, 10 reps, Supine Heel Slides: AAROM, Strengthening, Left, 10 reps, Supine Hip ABduction/ADduction: AAROM, Strengthening, Left, 10 reps, Supine    Assessment/Plan    PT Assessment Patient needs continued PT services  PT Problem List Decreased strength;Decreased activity tolerance;Decreased balance;Decreased mobility;Decreased knowledge of use of DME;Decreased knowledge of precautions;Pain;Decreased skin integrity       PT Treatment Interventions DME instruction;Gait training;Functional mobility training;Therapeutic activities;Therapeutic exercise;Balance training;Patient/family education    PT Goals (Current goals can be found in the Care Plan section)  Acute Rehab PT Goals Patient Stated Goal: to improve mobility and strength PT Goal Formulation: With patient Time For Goal Achievement: 07/04/21 Potential to Achieve Goals: Good    Frequency BID     Co-evaluation               AM-PAC PT "6 Clicks" Mobility  Outcome Measure Help needed turning from your back to your side while in a flat bed without using bedrails?: A Little Help needed moving from lying on your back to sitting on the side of a flat bed without using bedrails?: A Little Help needed moving to and from a bed to a chair (including a wheelchair)?: A Little Help needed standing up from a chair using your arms (e.g., wheelchair or bedside chair)?: A Little Help needed to walk in hospital room?: A Little Help needed climbing 3-5 steps  with a railing? : A Lot 6 Click Score: 17    End of Session Equipment Utilized During Treatment: Gait belt Activity Tolerance: Patient tolerated treatment well Patient left: in chair;with call bell/phone within reach;with chair alarm set Nurse Communication: Mobility status;Precautions;Weight bearing status PT Visit Diagnosis: Other abnormalities of gait and mobility (R26.89);Muscle weakness (generalized) (M62.81);History of falling (Z91.81);Pain Pain - Right/Left: Left Pain - part of body: Hip    Time: 1259-1330 PT Time Calculation (min) (ACUTE ONLY): 31 min   Charges:   PT Evaluation $PT Eval Low Complexity: 1  Low PT Treatments $Gait Training: 8-22 mins $Therapeutic Exercise: 8-22 mins       Leitha Bleak, PT 06/20/21, 4:33 PM

## 2021-06-20 NOTE — Anesthesia Postprocedure Evaluation (Signed)
Anesthesia Post Note  Patient: Erin Hayes  Procedure(s) Performed: CANNULATED HIP PINNING (Left: Hip)  Patient location during evaluation: PACU Anesthesia Type: General Level of consciousness: awake and alert, oriented and patient cooperative Pain management: pain level controlled Vital Signs Assessment: post-procedure vital signs reviewed and stable Respiratory status: spontaneous breathing, nonlabored ventilation and respiratory function stable Cardiovascular status: blood pressure returned to baseline and stable Postop Assessment: adequate PO intake Anesthetic complications: no   No notable events documented.   Last Vitals:  Vitals:   06/19/21 2232 06/20/21 0443  BP: (!) 125/93 136/88  Pulse: 85 79  Resp: 16 16  Temp: 36.6 C 36.7 C  SpO2: 91% 98%    Last Pain:  Vitals:   06/20/21 0443  TempSrc: Oral  PainSc:                  Darrin Nipper

## 2021-06-20 NOTE — Progress Notes (Signed)
Physical Therapy Treatment Patient Details Name: Erin Hayes MRN: [id number] DOB: 04/11/1966 Today's Date: 06/20/2021   History of Present Illness Pt is a 56 y.o. female c/o L hip pain s/p trip and fall on boot on L foot; pt noted to have L femoral neck fx.  (-) L forearm fx.  Per chart "She had a left foot fracture about a year ago that healed and then she fell on it with subsequent swelling close to 3 months ago when she started wearing a boot on her left foot".  S/p L hip cannulated pinning 06/19/21.  PMH includes DM, htn, heart disease, recent L foot fx, CHF, and R carpal tunnel syndrome.    PT Comments    Pt resting in recliner upon PT arrival and requesting to toilet.  5/10 L hip pain beginning/end of session at rest (pt reporting recent pain meds).  Min assist with stand pivot transfer recliner to/from [facility] and min assist to ambulate 20 feet with youth sized RW (limited distance d/t L hip pain).  Pt kept L LE [facility] during sessions activities.  Will continue to focus on strengthening, balance, and progressive functional mobility during hospitalization.    Recommendations for follow up therapy are one component of a multi-disciplinary discharge planning process, led by the attending physician.  Recommendations may be updated based on patient status, additional functional criteria and insurance authorization.  Follow Up Recommendations  Skilled nursing-short term rehab (<3 hours/day)     Assistance Recommended at Discharge Frequent or constant Supervision/Assistance  Patient can return home with the following A little help with walking and/or transfers;A little help with bathing/dressing/bathroom;Assistance with cooking/housework;Assist for transportation;Help with stairs or ramp for entrance   Equipment Recommendations  Rolling walker (2 wheels);BSC/3in1;Wheelchair (measurements PT);Wheelchair cushion (measurements PT) (youth sized)    Recommendations for Other Services OT  consult     Precautions / Restrictions Precautions Precautions: Fall Restrictions Weight Bearing Restrictions: Yes LLE Weight Bearing: Touchdown weight bearing Other Position/Activity Restrictions: TTWB L LE     Mobility  Bed Mobility             General bed mobility comments: Deferred (pt in recliner beginning/end of session)    Transfers Overall transfer level: Needs assistance Equipment used: None, Rolling walker (2 wheels) Transfers: Sit to/from Stand, Bed to chair/wheelchair/BSC Sit to Stand: Min assist Stand pivot transfers: Min assist (recliner to/from [facility])         General transfer comment: vc's for UE/LE placement, overall technique, and L LE WB'ing status (pt kept L LE NWB'ing)    Ambulation/Gait Ambulation/Gait assistance: Min assist Gait Distance (Feet): 20 Feet Assistive device: Rolling walker (2 wheels) (youth sized)   Gait velocity: decreased     General Gait Details: vc's for walker use and overall technique; pt kept L LE [place]   Stairs             Wheelchair Mobility    Modified Rankin (Stroke Patients Only)       Balance Overall balance assessment: Needs assistance Sitting-balance support: No upper extremity supported, Feet supported Sitting balance-Leahy Scale: Good Sitting balance - Comments: steady sitting reaching within BOS   Standing balance support: Bilateral upper extremity supported, During functional activity Standing balance-Leahy Scale: Poor Standing balance comment: pt requiring assist for balance using walker                            Cognition Arousal/Alertness: Awake/alert Behavior During Therapy: [facility]  for tasks assessed/performed Overall Cognitive Status: Within Functional Limits for tasks assessed                                          Exercises     General Comments        Pertinent Vitals/Pain Pain Assessment Pain Assessment: 0-10 Pain Score: 5  Pain Location:  L hip Pain Descriptors / Indicators: Discomfort, Dull Pain Intervention(s): Limited activity within patient's tolerance, Monitored during session, Premedicated before session, Repositioned Vitals (HR and O2 on room air) stable and WFL throughout treatment session.    Home Living Family/patient expects to be discharged to:: Private residence Living Arrangements: Children (daughter and 46 month old granddaughter) Available Help at Discharge: Family;Available PRN/intermittently Type of Home: Mobile home Home Access: Ramped entrance       Home Layout: One level Home Equipment: None      Prior Function            PT Goals (current goals can now be found in the care plan section) Acute Rehab PT Goals Patient Stated Goal: to improve mobility and strength PT Goal Formulation: With patient Time For Goal Achievement: 07/04/21 Potential to Achieve Goals: Good Progress towards PT goals: Progressing toward goals    Frequency    BID      PT Plan Current plan remains appropriate    Co-evaluation              AM-PAC PT "6 Clicks" Mobility   Outcome Measure  Help needed turning from your back to your side while in a flat bed without using bedrails?: A Little Help needed moving from lying on your back to sitting on the side of a flat bed without using bedrails?: A Little Help needed moving to and from a bed to a chair (including a wheelchair)?: A Little Help needed standing up from a chair using your arms (e.g., wheelchair or bedside chair)?: A Little Help needed to walk in hospital room?: A Little Help needed climbing 3-5 steps with a railing? : A Lot 6 Click Score: 17    End of Session Equipment Utilized During Treatment: Gait belt Activity Tolerance: Patient tolerated treatment well Patient left: in chair;with call bell/phone within reach;with chair alarm set Nurse Communication: Mobility status;Precautions;Weight bearing status PT Visit Diagnosis: Other abnormalities  of gait and mobility (R26.89);Muscle weakness (generalized) (M62.81);History of falling (Z91.81);Pain Pain - Right/Left: Left Pain - part of body: Hip     Time: 6294-7654 PT Time Calculation (min) (ACUTE ONLY): 16 min  Charges:  $Therapeutic Activity: 8-22 mins                    Leitha Bleak, PT 06/20/21, 5:30 PM

## 2021-06-21 DIAGNOSIS — D62 Acute posthemorrhagic anemia: Secondary | ICD-10-CM

## 2021-06-21 DIAGNOSIS — I5022 Chronic systolic (congestive) heart failure: Secondary | ICD-10-CM

## 2021-06-21 DIAGNOSIS — S72002P Fracture of unspecified part of neck of left femur, subsequent encounter for closed fracture with malunion: Secondary | ICD-10-CM

## 2021-06-21 DIAGNOSIS — Z794 Long term (current) use of insulin: Secondary | ICD-10-CM

## 2021-06-21 DIAGNOSIS — E119 Type 2 diabetes mellitus without complications: Secondary | ICD-10-CM | POA: Diagnosis not present

## 2021-06-21 DIAGNOSIS — E7800 Pure hypercholesterolemia, unspecified: Secondary | ICD-10-CM

## 2021-06-21 DIAGNOSIS — I160 Hypertensive urgency: Secondary | ICD-10-CM | POA: Diagnosis present

## 2021-06-21 LAB — CBC
HCT: 30.4 % — ABNORMAL LOW (ref 36.0–46.0)
Hemoglobin: 9.6 g/dL — ABNORMAL LOW (ref 12.0–15.0)
MCH: 27.1 pg (ref 26.0–34.0)
MCHC: 31.6 g/dL (ref 30.0–36.0)
MCV: 85.9 fL (ref 80.0–100.0)
Platelets: 240 10*3/uL (ref 150–400)
RBC: 3.54 MIL/uL — ABNORMAL LOW (ref 3.87–5.11)
RDW: 13.5 % (ref 11.5–15.5)
WBC: 9.6 10*3/uL (ref 4.0–10.5)
nRBC: 0 % (ref 0.0–0.2)

## 2021-06-21 LAB — GLUCOSE, CAPILLARY
Glucose-Capillary: 135 mg/dL — ABNORMAL HIGH (ref 70–99)
Glucose-Capillary: 141 mg/dL — ABNORMAL HIGH (ref 70–99)
Glucose-Capillary: 141 mg/dL — ABNORMAL HIGH (ref 70–99)
Glucose-Capillary: 161 mg/dL — ABNORMAL HIGH (ref 70–99)
Glucose-Capillary: 173 mg/dL — ABNORMAL HIGH (ref 70–99)
Glucose-Capillary: 69 mg/dL — ABNORMAL LOW (ref 70–99)

## 2021-06-21 LAB — FOLATE: Folate: 7.1 ng/mL (ref >5.9)

## 2021-06-21 LAB — BASIC METABOLIC PANEL
Anion gap: 6 (ref 5–15)
BUN: 19 mg/dL (ref 6–20)
CO2: 23 mmol/L (ref 22–32)
Calcium: 7.8 mg/dL — ABNORMAL LOW (ref 8.9–10.3)
Chloride: 110 mmol/L (ref 98–111)
Creatinine, Ser: 1.01 mg/dL — ABNORMAL HIGH (ref 0.44–1.00)
GFR, Estimated: >60 mL/min (ref >60)
Glucose, Bld: 95 mg/dL (ref 70–99)
Potassium: 3.2 mmol/L — ABNORMAL LOW (ref 3.5–5.1)
Sodium: 139 mmol/L (ref 135–145)

## 2021-06-21 LAB — FERRITIN: Ferritin: 50 ng/mL (ref 11–307)

## 2021-06-21 LAB — MAGNESIUM: Magnesium: 1.8 mg/dL (ref 1.7–2.4)

## 2021-06-21 LAB — IRON AND TIBC
Iron: 27 ug/dL — ABNORMAL LOW (ref 28–170)
Saturation Ratios: 9 % — ABNORMAL LOW (ref 10.4–31.8)
TIBC: 305 ug/dL (ref 250–450)
UIBC: 278 ug/dL

## 2021-06-21 LAB — VITAMIN B12: Vitamin B-12: 135 pg/mL — ABNORMAL LOW (ref 180–914)

## 2021-06-21 MED ORDER — INSULIN ASPART 100 UNIT/ML IJ SOLN
0.0000 [IU] | Freq: Three times a day (TID) | INTRAMUSCULAR | Status: DC
  Administered 2021-06-21 – 2021-06-22 (×3): 1 [IU] via SUBCUTANEOUS
  Filled 2021-06-21 (×2): qty 1

## 2021-06-21 MED ORDER — POTASSIUM CHLORIDE CRYS ER 20 MEQ PO TBCR
40.0000 meq | EXTENDED_RELEASE_TABLET | Freq: Once | ORAL | Status: AC
  Administered 2021-06-21: 40 meq via ORAL
  Filled 2021-06-21: qty 2

## 2021-06-21 MED ORDER — INSULIN GLARGINE-YFGN 100 UNIT/ML ~~LOC~~ SOLN
12.0000 [IU] | Freq: Every day | SUBCUTANEOUS | Status: DC
  Administered 2021-06-21: 12 [IU] via SUBCUTANEOUS
  Filled 2021-06-21 (×2): qty 0.12

## 2021-06-21 NOTE — TOC Progression Note (Signed)
Transition of Care [facility]) - Progression Note    Patient Details  Name: Erin Hayes MRN: [id number] Date of Birth: 1965-07-31  Transition of Care [facility]) CM/SW [place], RN Phone Number: 06/21/2021, 10:56 AM  Clinical Narrative:   Reviewed the bed offers with the patient, She stated that she will have her sisiter go check them out and then will decide She will let me know today her choice         Expected Discharge Plan and Services                                                 Social Determinants of Health (SDOH) Interventions    Readmission Risk Interventions No flowsheet data found.

## 2021-06-21 NOTE — Progress Notes (Signed)
PT Cancellation Note  Patient Details Name: Erin Hayes MRN: [id number] DOB: January 25, 1966   Cancelled Treatment:     Pt currently presenting with low blood glucose levels. Per Rn is symptomatic.Author will return later this date once blood sugars levels elevate.    Willette Pa 06/21/2021, 8:09 AM

## 2021-06-21 NOTE — Progress Notes (Signed)
PROGRESS NOTE    Erin Hayes  VOP:[id number] DOB: Mar 19, 1966 DOA: 06/18/2021 PCP: [name], NP    Chief Complaint  Patient presents with   Fall    Brief Narrative: Patient 56 year old African-American female history of chronic combined systolic diastolic CHF, type 2 diabetes, GERD, dyslipidemia, hypertension, nonischemic cardiomyopathy presented to the ED with left hip pain after mechanical fall.  Patient noted to have left foot fracture about a year ago healed and subsequently swelling close to 3 months and started wearing a boot on her left foot.  Patient also noted on presentation to be in hypertensive urgency.  CT head negative for any acute abnormalities.  Plain films of the left femur showed a basically cervical femoral neck fracture, left forearm x-ray negative.  Left hip CT with nondisplaced commuted fracture of the left femoral neck extending from medial mid femoral neck to superior subcapital portion associated with mild valgus and apex anterior angulation.  Patient seen in consultation by orthopedics and underwent surgical repair 04/18/2022.   Assessment & Plan:   Principal Problem:   Closed left hip fracture (HCC) Active Problems:   Hypertensive urgency   DM (diabetes mellitus), type 2 (HCC)   Essential hypertension   Hypercholesterolemia   CHF (congestive heart failure) (HCC)   Acute postoperative anemia due to expected blood loss  #1 closed left femoral neck fracture -Secondary to mechanical fall. -Patient seen in consultation by orthopedics and patient status post FNS of left femoral neck on 06/19/2021. -Orthopedics following and recommending touchdown weightbearing left lower extremity, Lovenox x2 weeks for DVT prophylaxis then transition to aspirin with outpatient follow-up 2 weeks post discharge for repeat x-rays and staple removal. -Pain management per orthopedics. -PT/OT.  2.  Hypertensive urgency -Hypertensive urgency resolved. -Continue home regimen  of Entresto, Aldactone, carvedilol, Lasix.  IV labetalol as needed.  3.  Hyperlipidemia -Statin.  4.  Diabetes mellitus type 2 -Hemoglobin A1c 7.2 (05/11/2020) -Repeat hemoglobin A1c. -Patient noted to have a CBG of 69 this morning. -Decrease Semglee to 12 units daily. -SSI.  5.  Chronic combined CHF -Compensated. -Continue home regimen Lasix, Aldactone, Coreg, Entresto.  6.  Acute postop acute blood loss anemia -Hemoglobin at 9.6 from 11.9 on admission. -Patient with no overt bleeding -Anemia panel obtained this morning with a iron level of 27, TIBC of 305, ferritin of 50, folate of 7.1. -Follow H&H. -Transfusion threshold hemoglobin < 7.   DVT prophylaxis: Lovenox Code Status: Full Family Communication: Updated patient.  No family at bedside. Disposition:   Status is: Inpatient Remains inpatient appropriate because: Severity of illness           Consultants:  Orthopedics: Dr. [name] 06/19/2021  Procedures:  Left femoral neck FNS per Dr. [name] 06/19/2021 CT left hip 06/18/2021 CT head 06/18/2021 Plain film of the left hip and pelvis 06/18/2021, 06/19/2021 Plain films of the left forearm 06/18/2021 Plain films of the left femur 06/18/2021  Antimicrobials:  None   Subjective: Sitting up in chair. Pain improving postop.  No chest pain.  No shortness of breath.  Denies any overt bleeding.  Objective: Vitals:   06/21/21 0500 06/21/21 0806 06/21/21 1150 06/21/21 1526  BP: 129/86 136/89 112/77 101/73  Pulse: 70 71 73 69  Resp: 17 18 16 18   Temp:  98.3 F (36.8 C) 98.4 F (36.9 C) 98 F (36.7 C)  TempSrc:      SpO2: 96% 99% 98% 91%  Weight:      Height:  Intake/Output Summary (Last 24 hours) at 06/21/2021 1626 Last data filed at 06/21/2021 1359 Gross per 24 hour  Intake 560 ml  Output 100 ml  Net 460 ml   Filed Weights   06/18/21 1646 06/18/21 2000 06/19/21 1413  Weight: 54.4 kg 53.5 kg 54.4 kg    Examination:  General exam: Appears calm  and comfortable  Respiratory system: Clear to auscultation. Respiratory effort normal. Cardiovascular system: S1 & S2 heard, RRR. No JVD, murmurs, rubs, gallops or clicks. No pedal edema. Gastrointestinal system: Abdomen is nondistended, soft and nontender. No organomegaly or masses felt. Normal bowel sounds heard. Central nervous system: Alert and oriented. No focal neurological deficits. Extremities: Symmetric 5 x 5 power.  Left hip with dressing C/d/I. Skin: No rashes, lesions or ulcers Psychiatry: Judgement and insight appear normal. Mood & affect appropriate.     Data Reviewed: I have personally reviewed following labs and imaging studies  CBC: Recent Labs  Lab 06/18/21 1747 06/19/21 0623 06/20/21 0602 06/21/21 0546  WBC 7.8 8.3 10.7* 9.6  NEUTROABS 5.3  --   --   --   HGB 11.9* 10.5* 11.3* 9.6*  HCT 37.8 33.6* 35.5* 30.4*  MCV 86.7 88.2 88.3 85.9  PLT 338 252 262 233    Basic Metabolic Panel: Recent Labs  Lab 06/18/21 1747 06/19/21 0623 06/20/21 0602 06/21/21 0546 06/21/21 0854  NA 136 137 138 139  --   K 3.5 3.7 3.9 3.2*  --   CL 102 109 110 110  --   CO2 26 23 20* 23  --   GLUCOSE 122* 135* 239* 95  --   BUN 20 19 23* 19  --   CREATININE 0.94 0.89 0.96 1.01*  --   CALCIUM 8.7* 7.8* 7.8* 7.8*  --   MG  --   --   --   --  1.8    GFR: Estimated Creatinine Clearance: 46.3 mL/min (A) (by C-G formula based on SCr of 1.01 mg/dL (H)).  Liver Function Tests: Recent Labs  Lab 06/18/21 1747  AST 19  ALT 16  ALKPHOS 97  BILITOT 0.8  PROT 7.0  ALBUMIN 3.8    CBG: Recent Labs  Lab 06/21/21 0021 06/21/21 0734 06/21/21 0840 06/21/21 1117 06/21/21 1549  GLUCAP 141* 69* 135* 173* 161*     Recent Results (from the past 240 hour(s))  Resp Panel by RT-PCR (Flu A&B, Covid) Nasopharyngeal Swab     Status: None   Collection Time: 06/18/21  7:50 PM   Specimen: Nasopharyngeal Swab; Nasopharyngeal(NP) swabs in vial transport medium  Result Value Ref Range  Status   SARS Coronavirus 2 by RT PCR NEGATIVE NEGATIVE Final    Comment: (NOTE) SARS-CoV-2 target nucleic acids are NOT DETECTED.  The SARS-CoV-2 RNA is generally detectable in upper respiratory specimens during the acute phase of infection. The lowest concentration of SARS-CoV-2 viral copies this assay can detect is 138 copies/mL. A negative result does not preclude SARS-Cov-2 infection and should not be used as the sole basis for treatment or other patient management decisions. A negative result may occur with  improper specimen collection/handling, submission of specimen other than nasopharyngeal swab, presence of viral mutation(s) within the areas targeted by this assay, and inadequate number of viral copies(<138 copies/mL). A negative result must be combined with clinical observations, patient history, and epidemiological information. The expected result is Negative.  Fact Sheet for Patients:  EntrepreneurPulse.com.au  Fact Sheet for Healthcare Providers:  IncredibleEmployment.be  This test is no t  yet approved or cleared by the [place] and  has been authorized for detection and/or diagnosis of SARS-CoV-2 by FDA under an Emergency Use Authorization (EUA). This EUA will remain  in effect (meaning this test can be used) for the duration of the COVID-19 declaration under Section 564(b)(1) of the Act, 21 U.S.C.section 360bbb-3(b)(1), unless the authorization is terminated  or revoked sooner.       Influenza A by PCR NEGATIVE NEGATIVE Final   Influenza B by PCR NEGATIVE NEGATIVE Final    Comment: (NOTE) The Xpert Xpress SARS-CoV-2/FLU/RSV plus assay is intended as an aid in the diagnosis of influenza from Nasopharyngeal swab specimens and should not be used as a sole basis for treatment. Nasal washings and aspirates are unacceptable for Xpert Xpress SARS-CoV-2/FLU/RSV testing.  Fact Sheet for  Patients: EntrepreneurPulse.com.au  Fact Sheet for Healthcare Providers: IncredibleEmployment.be  This test is not yet approved or cleared by the [place] FDA and has been authorized for detection and/or diagnosis of SARS-CoV-2 by FDA under an Emergency Use Authorization (EUA). This EUA will remain in effect (meaning this test can be used) for the duration of the COVID-19 declaration under Section 564(b)(1) of the Act, 21 U.S.C. section 360bbb-3(b)(1), unless the authorization is terminated or revoked.  Performed at [facility], [address], [place], [place] [address]   Surgical PCR screen     Status: None   Collection Time: 06/18/21  8:39 PM   Specimen: Nasal Mucosa; Nasal Swab  Result Value Ref Range Status   MRSA, PCR NEGATIVE NEGATIVE Final   Staphylococcus aureus NEGATIVE NEGATIVE Final    Comment: (NOTE) The Xpert SA Assay (FDA approved for NASAL specimens in patients 39 years of age and older), is one component of a comprehensive surveillance program. It is not intended to diagnose infection nor to guide or monitor treatment. Performed at [facility], [address] [place], [place] [address]          Radiology Studies: DG C-Arm 1-60 Min-No Report  Result Date: 06/19/2021 Fluoroscopy was utilized by the requesting physician.  No radiographic interpretation.   DG HIP UNILAT WITH PELVIS 2-3 VIEWS LEFT  Result Date: 06/19/2021 CLINICAL DATA:  Left femoral neck nondisplaced fracture EXAM: DG HIP (WITH OR WITHOUT PELVIS) 2-3V LEFT COMPARISON:  06/18/2021 FINDINGS: Spot fluoroscopic views demonstrate operative fixation hardware traversing the intertrochanteric region into the femoral head. Anatomic alignment. No complicating feature. Expected postoperative changes. IMPRESSION: Status post ORIF of the left femoral neck fracture. Anatomic alignment. Electronically Signed   By: Jerilynn Mages.  Shick M.D.   On: 06/19/2021  17:08        Scheduled Meds:  carvedilol  18.75 mg Oral BID   enoxaparin (LOVENOX) injection  40 mg Subcutaneous Q24H   feeding supplement (GLUCERNA SHAKE)  237 mL Oral TID BM   fluticasone  2 spray Each Nare Daily   furosemide  20 mg Oral Daily   insulin aspart  0-6 Units Subcutaneous TID WC   insulin glargine-yfgn  12 Units Subcutaneous QHS   loperamide HCl  1 mg Oral Daily   loratadine  10 mg Oral Daily   multivitamin with minerals  1 tablet Oral Daily   mupirocin ointment  1 application Nasal BID   rosuvastatin  20 mg Oral Daily   sacubitril-valsartan  1 tablet Oral BID   spironolactone  25 mg Oral Daily   triamcinolone cream   Topical BID   Continuous Infusions:  sodium chloride 50 mL/hr at 06/20/21  0703     LOS: 3 days    Time spent: 35 minutes    Irine Seal, MD [facility]   To contact the attending provider between 7A-7P or the covering provider during after hours 7P-7A, please log into the web site [email or web address] and access using universal [place] password for that web site. If you do not have the password, please call the hospital operator.  06/21/2021, 4:26 PM

## 2021-06-21 NOTE — TOC Progression Note (Signed)
Transition of Care [facility]) - Progression Note    Patient Details  Name: Erin Hayes MRN: [id number] Date of Birth: [birth year]  Transition of [facility]) CM/SW [place], RN Phone Number: 06/21/2021, 3:55 PM  Clinical Narrative:   met with the patient and explained that we need a choice so that she does not lose one of the bed offers she has, she called her sister and her sister stated that they are reviewing the BCDSS information and Medicare and will let me know in a few min which one         Expected Discharge Plan and Services                                                 Social Determinants of Health (SDOH) Interventions    Readmission Risk Interventions No flowsheet data found.

## 2021-06-21 NOTE — Care Management Important Message (Signed)
Important Message  Patient Details  Name: Erin Hayes MRN: [id number] Date of Birth: 1966/05/06   Medicare Important Message Given:  Yes     [name] [name] 06/21/2021, 2:44 PM

## 2021-06-21 NOTE — Plan of Care (Signed)
°  Problem: Education: Goal: Knowledge of General Education information will improve Description: Including pain rating scale, medication(s)/side effects and non-pharmacologic comfort measures Outcome: Progressing   Problem: Health Behavior/Discharge Planning: Goal: Ability to manage health-related needs will improve Outcome: Progressing   Problem: Clinical Measurements: Goal: Ability to maintain clinical measurements within normal limits will improve Outcome: Progressing Goal: Will remain free from infection Outcome: Progressing Goal: Diagnostic test results will improve Outcome: Progressing Goal: Respiratory complications will improve Outcome: Progressing Goal: Cardiovascular complication will be avoided Outcome: Progressing   Problem: Activity: Goal: Risk for activity intolerance will decrease Outcome: Progressing   Problem: Nutrition: Goal: Adequate nutrition will be maintained Outcome: Progressing   Problem: Coping: Goal: Level of anxiety will decrease Outcome: Progressing   Problem: Elimination: Goal: Will not experience complications related to bowel motility Outcome: Progressing Goal: Will not experience complications related to urinary retention Outcome: Progressing   Problem: Pain Managment: Goal: General experience of comfort will improve Outcome: Progressing   Problem: Safety: Goal: Ability to remain free from injury will improve Outcome: Progressing   Problem: Skin Integrity: Goal: Risk for impaired skin integrity will decrease Outcome: Progressing   Problem: Education: Goal: Verbalization of understanding the information provided (i.e., activity precautions, restrictions, etc) will improve Outcome: Progressing Goal: Individualized Educational Video(s) Outcome: Progressing   Problem: Activity: Goal: Ability to ambulate and perform ADLs will improve Outcome: Progressing   Problem: Self-Concept: Goal: Ability to maintain and perform role  responsibilities to the fullest extent possible will improve Outcome: Progressing   Problem: Clinical Measurements: Goal: Postoperative complications will be avoided or minimized Outcome: Progressing

## 2021-06-21 NOTE — Progress Notes (Signed)
Inpatient Diabetes Program Recommendations  AACE/ADA: New Consensus Statement on Inpatient Glycemic Control (2015)  Target Ranges:  Prepandial:   less than 140 mg/dL      Peak postprandial:   less than 180 mg/dL (1-2 hours)      Critically ill patients:  140 - 180 mg/dL    Latest Reference Range & Units 06/20/21 08:00 06/20/21 11:07 06/20/21 16:14 06/20/21 21:48  Glucose-Capillary 70 - 99 mg/dL 193 (H)  1 unit Novolog  205 (H)  2 units Novolog  113 (H) 142 (H)   14 units Semglee     Latest Reference Range & Units 06/21/21 07:34  Glucose-Capillary 70 - 99 mg/dL 69 (L)     Home DM Meds: Lantus 14 units QHS    Bydureon 2 mg Qweek   Current Orders: Semglee 14 units QHS  Novolog 0-6 units Q4H    MD- Note Hypoglycemia this AM (CBG 69)  Please consider reducing the Semglee to 12 units QHS    --Will follow patient during hospitalization--  Wyn Quaker RN, MSN, CDE Diabetes Coordinator Inpatient Glycemic Control Team Team Pager: [phone] (8a-5p)

## 2021-06-21 NOTE — TOC Progression Note (Signed)
Transition of Care [facility]) - Progression Note    Patient Details  Name: [name] MRN: [id number] Date of Birth: 1965-12-19  Transition of Care [facility]) CM/SW [place], RN Phone Number: 06/21/2021, 1:53 PM  Clinical Narrative:   Spoke with the patient to determine if she has chosen a SNF bed, She stated she is still waiting to hear back from her sister, I explained I will need to hold one of the beds or we could lose it, she stated that she will call her sister to see if she has chosen and will let mer know         Expected Discharge Plan and Services                                                 Social Determinants of Health (SDOH) Interventions    Readmission Risk Interventions No flowsheet data found.

## 2021-06-21 NOTE — TOC Progression Note (Addendum)
Transition of Care [facility]) - Progression Note    Patient Details  Name: NAYA ILAGAN MRN: [id number] Date of Birth: 05/03/1966  Transition of Care [facility]) CM/SW [place], RN Phone Number: 06/21/2021, 4:46 PM  Clinical Narrative:    Met with the patient and requested her bed choice, She called her sister and asked which one she recommended, Her sister stated that either [facility] or [facility] would be ok, she stated that she is looking up on the state site to determine if the facility has any siting's, She stated that she will determine which one and let me know, I explained that the anticipated DC is for tomorrow, I explained that the bed offer may not be available  and if not she will have to go to which one does still have an offer, I explained the reason that we started the process earlier is so that she would have a choice as to where she went for STR, she stated understanding and said she will let me know in the morning but would go to either one that had a bed offer if one  of them doesn't        Expected Discharge Plan and Services                                                 Social Determinants of Health (SDOH) Interventions    Readmission Risk Interventions No flowsheet data found.

## 2021-06-21 NOTE — Progress Notes (Signed)
Subjective:  Patient is sitting in bedside chair.  Reports mild pain in the hip, which is improving.  Objective:   VITALS:   Vitals:   06/20/21 2013 06/21/21 0500 06/21/21 0806 06/21/21 1150  BP: (!) 141/90 129/86 136/89 112/77  Pulse: 73 70 71 73  Resp: 17 17 18 16   Temp: 98.4 F (36.9 C)  98.3 F (36.8 C) 98.4 F (36.9 C)  TempSrc:      SpO2: 98% 96% 99% 98%  Weight:      Height:        PHYSICAL EXAM:  General: Alert, comfortable Left lower extremity: Dressings are clean dry and intact.  Left lower extremity is neurovascularly intact   LABS  Results for orders placed or performed during the hospital encounter of 06/18/21 (from the past 24 hour(s))  Glucose, capillary     Status: Abnormal   Collection Time: 06/20/21  4:14 PM  Result Value Ref Range   Glucose-Capillary 113 (H) 70 - 99 mg/dL  Glucose, capillary     Status: Abnormal   Collection Time: 06/20/21  9:48 PM  Result Value Ref Range   Glucose-Capillary 142 (H) 70 - 99 mg/dL  Glucose, capillary     Status: Abnormal   Collection Time: 06/21/21 12:21 AM  Result Value Ref Range   Glucose-Capillary 141 (H) 70 - 99 mg/dL  CBC     Status: Abnormal   Collection Time: 06/21/21  5:46 AM  Result Value Ref Range   WBC 9.6 4.0 - 10.5 K/uL   RBC 3.54 (L) 3.87 - 5.11 MIL/uL   Hemoglobin 9.6 (L) 12.0 - 15.0 g/dL   HCT 30.4 (L) 36.0 - 46.0 %   MCV 85.9 80.0 - 100.0 fL   MCH 27.1 26.0 - 34.0 pg   MCHC 31.6 30.0 - 36.0 g/dL   RDW 13.5 11.5 - 15.5 %   Platelets 240 150 - 400 K/uL   nRBC 0.0 0.0 - 0.2 %  Basic metabolic panel     Status: Abnormal   Collection Time: 06/21/21  5:46 AM  Result Value Ref Range   Sodium 139 135 - 145 mmol/L   Potassium 3.2 (L) 3.5 - 5.1 mmol/L   Chloride 110 98 - 111 mmol/L   CO2 23 22 - 32 mmol/L   Glucose, Bld 95 70 - 99 mg/dL   BUN 19 6 - 20 mg/dL   Creatinine, Ser 1.01 (H) 0.44 - 1.00 mg/dL   Calcium 7.8 (L) 8.9 - 10.3 mg/dL   GFR, Estimated >60 >60 mL/min   Anion gap 6 5 - 15   Glucose, capillary     Status: Abnormal   Collection Time: 06/21/21  7:34 AM  Result Value Ref Range   Glucose-Capillary 69 (L) 70 - 99 mg/dL  Glucose, capillary     Status: Abnormal   Collection Time: 06/21/21  8:40 AM  Result Value Ref Range   Glucose-Capillary 135 (H) 70 - 99 mg/dL  Magnesium     Status: None   Collection Time: 06/21/21  8:54 AM  Result Value Ref Range   Magnesium 1.8 1.7 - 2.4 mg/dL  Folate     Status: None   Collection Time: 06/21/21  8:54 AM  Result Value Ref Range   Folate 7.1 >5.9 ng/mL  Iron and TIBC     Status: Abnormal   Collection Time: 06/21/21  8:54 AM  Result Value Ref Range   Iron 27 (L) 28 - 170 ug/dL   TIBC 305 250 -  450 ug/dL   Saturation Ratios 9 (L) 10.4 - 31.8 %   UIBC 278 ug/dL  Ferritin     Status: None   Collection Time: 06/21/21  8:54 AM  Result Value Ref Range   Ferritin 50 11 - 307 ng/mL  Glucose, capillary     Status: Abnormal   Collection Time: 06/21/21 11:17 AM  Result Value Ref Range   Glucose-Capillary 173 (H) 70 - 99 mg/dL    DG C-Arm 1-60 Min-No Report  Result Date: 06/19/2021 Fluoroscopy was utilized by the requesting physician.  No radiographic interpretation.   DG HIP UNILAT WITH PELVIS 2-3 VIEWS LEFT  Result Date: 06/19/2021 CLINICAL DATA:  Left femoral neck nondisplaced fracture EXAM: DG HIP (WITH OR WITHOUT PELVIS) 2-3V LEFT COMPARISON:  06/18/2021 FINDINGS: Spot fluoroscopic views demonstrate operative fixation hardware traversing the intertrochanteric region into the femoral head. Anatomic alignment. No complicating feature. Expected postoperative changes. IMPRESSION: Status post ORIF of the left femoral neck fracture. Anatomic alignment. Electronically Signed   By: Jerilynn Mages.  Shick M.D.   On: 06/19/2021 17:08    Assessment/Plan: 2 Days Post-Op  L femoral neck percutaneous pinning with FNS  -Appreciate hospitalist team support -PT/OT: Touchdown weightbearing left lower extremity -DVT prophylaxis: Lovenox x2 weeks  then transition to aspirin -Discharge per PT/case management -Follow-up in clinic in 2 weeks for x-rays and staple removal      Renee Harder , MD 06/21/2021, 1:00 PM

## 2021-06-21 NOTE — Progress Notes (Addendum)
Physical Therapy Treatment Patient Details Name: Erin Hayes MRN: [id number] DOB: 1966/02/12 Today's Date: 06/21/2021   History of Present Illness Pt is a 56 y.o. female c/o L hip pain s/p trip and fall on boot on L foot; pt noted to have L femoral neck fx.  (-) L forearm fx.  Per chart "She had a left foot fracture about a year ago that healed and then she fell on it with subsequent swelling close to 3 months ago when she started wearing a boot on her left foot".  S/p L hip cannulated pinning 06/19/21.  PMH includes DM, htn, heart disease, recent L foot fx, CHF, and R carpal tunnel syndrome.    PT Comments    Pt was long sitting in bed upon arriving, She is A and O x 4 and agreeable to session. Blood sugars are within safe range for participation.  Does endorse 7/10 pain. RN made aware. She was able to exit bed with assistance + vcs. Stood EOB a few times prior to stand hop/pivot to recliner. Pt is TTWB however in standing prefers to be NWB. Overall she is doing well. Will not have assistance during the day at DC. PT recommends DC to SNF to address deficits while maximizing independence with ADLs.    Recommendations for follow up therapy are one component of a multi-disciplinary discharge planning process, led by the attending physician.  Recommendations may be updated based on patient status, additional functional criteria and insurance authorization.  Follow Up Recommendations  Skilled nursing-short term rehab (<3 hours/day) (pt does not have assistance at home.)     Assistance Recommended at Discharge Frequent or constant Supervision/Assistance  Patient can return home with the following A little help with walking and/or transfers;A little help with bathing/dressing/bathroom;Assistance with cooking/housework;Assist for transportation;Help with stairs or ramp for entrance   Equipment Recommendations  Rolling walker (2 wheels);BSC/3in1;Wheelchair (measurements PT);Wheelchair cushion  (measurements PT) (pediatric RW)       Precautions / Restrictions Precautions Precautions: Fall Restrictions Weight Bearing Restrictions: Yes LLE Weight Bearing: Touchdown weight bearing     Mobility  Bed Mobility Overal bed mobility: Needs Assistance Bed Mobility: Supine to Sit   Supine to sit: Min assist, HOB elevated   General bed mobility comments: Pt required physical assistance to progress LLE. Slow movin due to c/o sevre pain in L hip with movements.    Transfers Overall transfer level: Needs assistance Equipment used: Rolling walker (2 wheels) (Pediatric) Transfers: Sit to/from Stand, Bed to chair/wheelchair/BSC Sit to Stand: Min assist, From elevated surface Stand pivot transfers: Min assist         General transfer comment: Pt was able to stand several times EOB prior to stand pivot towards R to recliner. She does well overall however prefers to be completely NWB versus TTWB.    Ambulation/Gait Ambulation/Gait assistance: Min assist Gait Distance (Feet): 5 Feet Assistive device: Rolling walker (2 wheels) Gait Pattern/deviations:  (" hop to") Gait velocity: decreased     General Gait Details: Pt was able to clear RLE off floor during gait short distances. Limited distance due to pain/ fatigue/ wt bearing restrictions. Pt does have a ramp already at her current home.    Balance Overall balance assessment: Needs assistance Sitting-balance support: No upper extremity supported, Feet supported Sitting balance-Leahy Scale: Good     Standing balance support: Bilateral upper extremity supported, During functional activity Standing balance-Leahy Scale: Fair Standing balance comment: reliant on RW for support due to wt bearing restrictions  Cognition Arousal/Alertness: Awake/alert Behavior During Therapy: WFL for tasks assessed/performed Overall Cognitive Status: Within Functional Limits for tasks assessed        General Comments: Pt is A and O x 4.  Cooperative and pleasant throughout               Pertinent Vitals/Pain Pain Assessment Pain Assessment: 0-10 Pain Score: 7  Pain Location: L hip Pain Descriptors / Indicators: Discomfort, Dull Pain Intervention(s): Limited activity within patient's tolerance, Monitored during session, Premedicated before session, Repositioned     PT Goals (current goals can now be found in the care plan section) Acute Rehab PT Goals Patient Stated Goal: to improve mobility and strength Progress towards PT goals: Progressing toward goals    Frequency    BID      PT Plan Current plan remains appropriate       AM-PAC PT "6 Clicks" Mobility   Outcome Measure  Help needed turning from your back to your side while in a flat bed without using bedrails?: A Little Help needed moving from lying on your back to sitting on the side of a flat bed without using bedrails?: A Little Help needed moving to and from a bed to a chair (including a wheelchair)?: A Little Help needed standing up from a chair using your arms (e.g., wheelchair or bedside chair)?: A Little Help needed to walk in hospital room?: A Little Help needed climbing 3-5 steps with a railing? : A Lot 6 Click Score: 17    End of Session   Activity Tolerance: Patient tolerated treatment well Patient left: in chair;with call bell/phone within reach;with chair alarm set Nurse Communication: Mobility status;Precautions;Weight bearing status PT Visit Diagnosis: Other abnormalities of gait and mobility (R26.89);Muscle weakness (generalized) (M62.81);History of falling (Z91.81);Pain Pain - Right/Left: Left Pain - part of body: Hip     Time: 4098-1191 PT Time Calculation (min) (ACUTE ONLY): 15 min  Charges:  $Therapeutic Activity: 8-22 mins                     Julaine Fusi PTA 06/21/21, 9:59 AM

## 2021-06-21 NOTE — Progress Notes (Signed)
Occupational Therapy Treatment Patient Details Name: Erin Hayes MRN: [id number] DOB: 1965/08/05 Today's Date: 06/21/2021   History of present illness Pt is a 56 y.o. female c/o L hip pain s/p trip and fall on boot on L foot; pt noted to have L femoral neck fx.  (-) L forearm fx.  Per chart "She had a left foot fracture about a year ago that healed and then she fell on it with subsequent swelling close to 3 months ago when she started wearing a boot on her left foot".  S/p L hip cannulated pinning 06/19/21.  PMH includes DM, htn, heart disease, recent L foot fx, CHF, and R carpal tunnel syndrome.   OT comments  Erin Hayes was seen for OT treatment on this date. Upon arrival to room pt reclined in chair, agreeable to tx. Pt requires MIN A + RW for BSC t/f, SETUP + SUPERVISION for perihygeien in sitting. 1 minor LOB in standing requiring assist to correct. Pt making good progress toward goals. Pt continues to benefit from skilled OT services to maximize return to PLOF and minimize risk of future falls, injury, caregiver burden, and readmission. Will continue to follow POC. Discharge recommendation remains appropriate as pt does not have assistance during the day.     Recommendations for follow up therapy are one component of a multi-disciplinary discharge planning process, led by the attending physician.  Recommendations may be updated based on patient status, additional functional criteria and insurance authorization.    Follow Up Recommendations  Skilled nursing-short term rehab (<3 hours/day)    Assistance Recommended at Discharge Frequent or constant Supervision/Assistance  Patient can return home with the following  A little help with walking and/or transfers;A little help with bathing/dressing/bathroom;Help with stairs or ramp for entrance;Assistance with cooking/housework   Equipment Recommendations  BSC/3in1    Recommendations for Other Services      Precautions / Restrictions  Precautions Precautions: Fall Restrictions Weight Bearing Restrictions: Yes LLE Weight Bearing: Touchdown weight bearing       Mobility Bed Mobility               General bed mobility comments: received and left in chair    Transfers Overall transfer level: Needs assistance Equipment used: Rolling walker (2 wheels) Transfers: Sit to/from Stand, Bed to chair/wheelchair/BSC Sit to Stand: Min assist, From elevated surface     Step pivot transfers: Min assist     General transfer comment: prefers to keep [place]     Balance Overall balance assessment: Needs assistance Sitting-balance support: No upper extremity supported, Feet supported Sitting balance-Leahy Scale: Good     Standing balance support: Single extremity supported, During functional activity Standing balance-Leahy Scale: Fair                             ADL either performed or assessed with clinical judgement   ADL Overall ADL's : Needs assistance/impaired                                       General ADL Comments: MIN A + RW for BSC t/f, SETUP + SUPERVISION for perihygeien in sitting. 1 minor LOB in standing requiring assist to correct.      Cognition Arousal/Alertness: Awake/alert Behavior During Therapy: WFL for tasks assessed/performed Overall Cognitive Status: Within Functional Limits for tasks assessed  Pertinent Vitals/ Pain       Pain Assessment Pain Assessment: 0-10 Pain Score: 4  Pain Location: L hip Pain Descriptors / Indicators: Discomfort, Dull Pain Intervention(s): Limited activity within patient's tolerance, Repositioned   Frequency  Min 3X/week        Progress Toward Goals  OT Goals(current goals can now be found in the care plan section)  Progress towards OT goals: Progressing toward goals  Acute Rehab OT Goals Patient Stated Goal: to get help at home OT Goal Formulation: With  patient Time For Goal Achievement: 07/04/21 Potential to Achieve Goals: Good ADL Goals Pt Will Perform Grooming: with set-up;with supervision;standing Pt Will Perform Lower Body Dressing: with min guard assist;sit to/from stand Pt Will Transfer to Toilet: with set-up;with supervision;ambulating;regular height toilet  Plan Discharge plan remains appropriate;Frequency remains appropriate    Co-evaluation                 AM-PAC OT "6 Clicks" Daily Activity     Outcome Measure   Help from another person eating meals?: None Help from another person taking care of personal grooming?: A Little Help from another person toileting, which includes using toliet, bedpan, or urinal?: A Little Help from another person bathing (including washing, rinsing, drying)?: A Lot Help from another person to put on and taking off regular upper body clothing?: A Little Help from another person to put on and taking off regular lower body clothing?: A Lot 6 Click Score: 17    End of Session Equipment Utilized During Treatment: Rolling walker (2 wheels)  OT Visit Diagnosis: Other abnormalities of gait and mobility (R26.89);Muscle weakness (generalized) (M62.81)   Activity Tolerance Patient tolerated treatment well   Patient Left in chair;with call bell/phone within reach   Nurse Communication          Time: 1124-1140 OT Time Calculation (min): 16 min  Charges: OT General Charges $OT Visit: 1 Visit OT Treatments $Self Care/Home Management : 8-22 mins  Dessie Coma, M.S. OTR/L  06/21/21, 1:35 PM  ascom [phone]

## 2021-06-21 NOTE — Progress Notes (Signed)
Physical Therapy Treatment Patient Details Name: Erin Hayes MRN: [id number] DOB: [birth year] Today's Date: 06/21/2021   History of Present Illness Pt is a 56 y.o. female c/o L hip pain s/p trip and fall on boot on L foot; pt noted to have L femoral neck fx.  (-) L forearm fx.  Per chart "She had a left foot fracture about a year ago that healed and then she fell on it with subsequent swelling close to 3 months ago when she started wearing a boot on her left foot".  S/p L hip cannulated pinning 06/19/21.  PMH includes DM, htn, heart disease, recent L foot fx, CHF, and R carpal tunnel syndrome.    PT Comments    Pt was asleep in recliner upon arriving. She easily awakes and agrees to session. Was easily able to stand to RW and ambulate to doorway of room and return. No LOB noted. Pt was performed almost all standing activity with NWB even though is TTWB. She fatigues quickly with ambulation but overall is doing well. Chief Strategy Officer discussed available assistance/supervision available at home. " I don't have anyone who can help me during the day." PT is progressing extremely well however would benefit from having assistance during the day. " I do have a ramp at home." If pt continues to demonstrate improving abilities over next few PT session, will update recs. Currently remains appropriate for STR at DC. Will continue to follow and progress as able per current POC.    Recommendations for follow up therapy are one component of a multi-disciplinary discharge planning process, led by the attending physician.  Recommendations may be updated based on patient status, additional functional criteria and insurance authorization.  Follow Up Recommendations  Skilled nursing-short term rehab (<3 hours/day)     Assistance Recommended at Discharge Frequent or constant Supervision/Assistance  Patient can return home with the following A little help with walking and/or transfers;A little help with  bathing/dressing/bathroom;Assistance with cooking/housework;Assist for transportation;Help with stairs or ramp for entrance   Equipment Recommendations  Rolling walker (2 wheels);BSC/3in1;Wheelchair (measurements PT);Wheelchair cushion (measurements PT)       Precautions / Restrictions Precautions Precautions: Fall Restrictions Weight Bearing Restrictions: Yes LLE Weight Bearing: Touchdown weight bearing     Mobility  Bed Mobility Overal bed mobility: Needs Assistance Bed Mobility: Supine to Sit     Supine to sit: Min assist, HOB elevated     General bed mobility comments: in recliner pre/post session    Transfers Overall transfer level: Needs assistance Equipment used: Rolling walker (2 wheels) Transfers: Sit to/from Stand Sit to Stand: Min guard Stand pivot transfers: Min assist         General transfer comment: Pt required less assistance to stand from recliner this afternoon versus AM treatment    Ambulation/Gait Ambulation/Gait assistance: Min guard Gait Distance (Feet): 25 Feet Assistive device: Rolling walker (2 wheels) Gait Pattern/deviations: Step-through pattern, Antalgic Gait velocity: decreased     General Gait Details: pt was able to ambulate to doorway of room and back 2 x prior to returning to recliner. Pt was mostly NWB. Author did re-educate pt on TTWD but pt remained NWB throughout. Does fatigue quickly during gait.   Balance Overall balance assessment: Needs assistance Sitting-balance support: No upper extremity supported, Feet supported Sitting balance-Leahy Scale: Good     Standing balance support: Bilateral upper extremity supported Standing balance-Leahy Scale: Good Standing balance comment: reliant on RW for support due to wt bearing restrictions  Cognition Arousal/Alertness: Awake/alert Behavior During Therapy: WFL for tasks assessed/performed Overall Cognitive Status: Within Functional Limits for tasks assessed       General Comments: Pt is A and O x 4. Cooperative and pleasant throughout               Pertinent Vitals/Pain Pain Assessment Pain Assessment: 0-10 Pain Score: 3  Pain Location: L hip Pain Descriptors / Indicators: Discomfort Pain Intervention(s): Limited activity within patient's tolerance, Monitored during session, Premedicated before session, Repositioned     PT Goals (current goals can now be found in the care plan section) Acute Rehab PT Goals Patient Stated Goal: rehab then home Progress towards PT goals: Progressing toward goals    Frequency    BID      PT Plan Current plan remains appropriate       AM-PAC PT "6 Clicks" Mobility   Outcome Measure  Help needed turning from your back to your side while in a flat bed without using bedrails?: A Little Help needed moving from lying on your back to sitting on the side of a flat bed without using bedrails?: A Little Help needed moving to and from a bed to a chair (including a wheelchair)?: A Little Help needed standing up from a chair using your arms (e.g., wheelchair or bedside chair)?: A Little Help needed to walk in hospital room?: A Little Help needed climbing 3-5 steps with a railing? : A Lot 6 Click Score: 17    End of Session   Activity Tolerance: Patient tolerated treatment well;Patient limited by fatigue Patient left: in chair;with call bell/phone within reach;with chair alarm set Nurse Communication: Mobility status;Precautions;Weight bearing status PT Visit Diagnosis: Other abnormalities of gait and mobility (R26.89);Muscle weakness (generalized) (M62.81);History of falling (Z91.81);Pain Pain - Right/Left: Left Pain - part of body: Hip     Time: 1638-4536 PT Time Calculation (min) (ACUTE ONLY): 8 min  Charges:  $Gait Training: 8-22 mins                     Erin Hayes PTA 06/21/21, 4:43 PM

## 2021-06-22 ENCOUNTER — Ambulatory Visit: Payer: Medicare Other | Admitting: Podiatry

## 2021-06-22 DIAGNOSIS — S72002A Fracture of unspecified part of neck of left femur, initial encounter for closed fracture: Secondary | ICD-10-CM

## 2021-06-22 DIAGNOSIS — E119 Type 2 diabetes mellitus without complications: Secondary | ICD-10-CM

## 2021-06-22 LAB — CBC
HCT: 33.9 % — ABNORMAL LOW (ref 36.0–46.0)
Hemoglobin: 10.7 g/dL — ABNORMAL LOW (ref 12.0–15.0)
MCH: 27.3 pg (ref 26.0–34.0)
MCHC: 31.6 g/dL (ref 30.0–36.0)
MCV: 86.5 fL (ref 80.0–100.0)
Platelets: 269 10*3/uL (ref 150–400)
RBC: 3.92 MIL/uL (ref 3.87–5.11)
RDW: 13.9 % (ref 11.5–15.5)
WBC: 8.7 10*3/uL (ref 4.0–10.5)
nRBC: 0 % (ref 0.0–0.2)

## 2021-06-22 LAB — GLUCOSE, CAPILLARY
Glucose-Capillary: 128 mg/dL — ABNORMAL HIGH (ref 70–99)
Glucose-Capillary: 183 mg/dL — ABNORMAL HIGH (ref 70–99)
Glucose-Capillary: 96 mg/dL (ref 70–99)
Glucose-Capillary: 97 mg/dL (ref 70–99)

## 2021-06-22 LAB — BASIC METABOLIC PANEL
Anion gap: 5 (ref 5–15)
BUN: 17 mg/dL (ref 6–20)
CO2: 26 mmol/L (ref 22–32)
Calcium: 8.3 mg/dL — ABNORMAL LOW (ref 8.9–10.3)
Chloride: 107 mmol/L (ref 98–111)
Creatinine, Ser: 1 mg/dL (ref 0.44–1.00)
GFR, Estimated: >60 mL/min (ref >60)
Glucose, Bld: 96 mg/dL (ref 70–99)
Potassium: 4.1 mmol/L (ref 3.5–5.1)
Sodium: 138 mmol/L (ref 135–145)

## 2021-06-22 LAB — RESP PANEL BY RT-PCR (FLU A&B, COVID) ARPGX2
Influenza A by PCR: NEGATIVE
Influenza B by PCR: NEGATIVE
SARS Coronavirus 2 by RT PCR: NEGATIVE

## 2021-06-22 LAB — HEMOGLOBIN A1C
Hgb A1c MFr Bld: 6.5 % — ABNORMAL HIGH (ref 4.8–5.6)
Mean Plasma Glucose: 139.85 mg/dL

## 2021-06-22 LAB — MAGNESIUM: Magnesium: 1.8 mg/dL (ref 1.7–2.4)

## 2021-06-22 MED ORDER — ADULT MULTIVITAMIN W/MINERALS CH
1.0000 | ORAL_TABLET | Freq: Every day | ORAL | Status: DC
Start: 2021-06-23 — End: 2023-04-09

## 2021-06-22 MED ORDER — GLUCERNA SHAKE PO LIQD: 237.0000 mL | Freq: Three times a day (TID) | ORAL | 0 refills | Status: DC

## 2021-06-22 MED ORDER — ASPIRIN 81 MG PO TABS: 81.0000 mg | ORAL_TABLET | Freq: Every day | ORAL | Status: DC

## 2021-06-22 MED ORDER — OXYCODONE HCL 5 MG PO TABS: 5.0000 mg | ORAL_TABLET | ORAL | 0 refills | Status: DC | PRN

## 2021-06-22 MED ORDER — TRAZODONE HCL 50 MG PO TABS: 25.0000 mg | ORAL_TABLET | Freq: Every evening | ORAL | Status: DC | PRN

## 2021-06-22 MED ORDER — INSULIN GLARGINE 100 UNIT/ML SOLOSTAR PEN: 12.0000 [IU] | PEN_INJECTOR | Freq: Every day | SUBCUTANEOUS | 3 refills | Status: DC

## 2021-06-22 MED ORDER — ENOXAPARIN SODIUM 40 MG/0.4ML IJ SOSY: 40.0000 mg | PREFILLED_SYRINGE | INTRAMUSCULAR | Status: DC

## 2021-06-22 MED ORDER — ASPIRIN EC 81 MG PO TBEC: 81.0000 mg | DELAYED_RELEASE_TABLET | Freq: Every day | ORAL | 2 refills | Status: DC

## 2021-06-22 NOTE — TOC Progression Note (Addendum)
Transition of Care [facility]) - Progression Note    Patient Details  Name: Erin Hayes MRN: [id number] Date of Birth: 1965-09-20  Transition of Care [facility]) CM/SW [place], RN Phone Number: 06/22/2021, 9:13 AM  Clinical Narrative:   Spoke with the patient and she would like to go to [facility] in [place], I notified Tammy at [facility], awaiting DC order and summary, patient will go [facility]         Expected Discharge Plan and Services                                                 Social Determinants of Health (SDOH) Interventions    Readmission Risk Interventions No flowsheet data found.

## 2021-06-22 NOTE — TOC Progression Note (Signed)
Transition of Care [facility]) - Progression Note    Patient Details  Name: Erin Hayes MRN: [id number] Date of Birth: [birth year]  Transition of Care [facility]) CM/SW [place], RN Phone Number: 06/22/2021, 2:21 PM  Clinical Narrative:   The patient is going to room 707 at [facility] via EMS transport, I called EMS and she is 4th on the list to be picked up, The Nurse will call report and she is alert and oriented and will let her family know    Expected Discharge Plan: Skilled Nursing Facility Barriers to Discharge: Continued Medical Work up  Expected Discharge Plan and Services Expected Discharge Plan: [place]         Expected Discharge Date: 06/22/21                                     Social Determinants of Health (SDOH) Interventions    Readmission Risk Interventions No flowsheet data found.

## 2021-06-22 NOTE — Progress Notes (Signed)
Physical Therapy Treatment Patient Details Name: Erin Hayes MRN: [id number] DOB: Feb 06, 1966 Today's Date: 06/22/2021   History of Present Illness Pt is a 56 y.o. female c/o L hip pain s/p trip and fall on boot on L foot; pt noted to have L femoral neck fx.  (-) L forearm fx.  Per chart "She had a left foot fracture about a year ago that healed and then she fell on it with subsequent swelling close to 3 months ago when she started wearing a boot on her left foot".  S/p L hip cannulated pinning 06/19/21.  PMH includes DM, htn, heart disease, recent L foot fx, CHF, and R carpal tunnel syndrome.    PT Comments    Pt was long sitting in bed upon arriving. She agrees to session and is cooperative throughout. Pt was able to ambulate while maintaining TTWB. No LOB. Does require increased time but overall is progressing. She does not have assistance at home. Author recommends short stay at rehab to address deficits while maximizing independence with ADLs.    Recommendations for follow up therapy are one component of a multi-disciplinary discharge planning process, led by the attending physician.  Recommendations may be updated based on patient status, additional functional criteria and insurance authorization.  Follow Up Recommendations  Skilled nursing-short term rehab (<3 hours/day)     Assistance Recommended at Discharge Frequent or constant Supervision/Assistance  Patient can return home with the following A little help with walking and/or transfers;A little help with bathing/dressing/bathroom;Assistance with cooking/housework;Assist for transportation;Help with stairs or ramp for entrance   Equipment Recommendations  Rolling walker (2 wheels);BSC/3in1;Wheelchair (measurements PT);Wheelchair cushion (measurements PT)       Precautions / Restrictions Precautions Precautions: Fall Restrictions Weight Bearing Restrictions: Yes LLE Weight Bearing: Touchdown weight bearing Other  Position/Activity Restrictions: TTWB L LE     Mobility  Bed Mobility Overal bed mobility: Needs Assistance Bed Mobility: Supine to Sit  Supine to sit: Min assist, HOB elevated    Transfers Overall transfer level: Needs assistance Equipment used: Rolling walker (2 wheels) Transfers: Sit to/from Stand Sit to Stand: Min guard      General transfer comment: CGA for safety to stand at EOB. was able to adhere to proper TWB. Overall pt is progressing well.    Ambulation/Gait Ambulation/Gait assistance: Min guard Gait Distance (Feet): 12 Feet Assistive device: Rolling walker (2 wheels) Gait Pattern/deviations:  (" hop to.") Gait velocity: decreased     General Gait Details: pt was able to "hop" around room to recliner with CGA. Prefers to be NWB but overall did better.    Balance Overall balance assessment: Needs assistance Sitting-balance support: No upper extremity supported, Feet supported Sitting balance-Leahy Scale: Good     Standing balance support: Bilateral upper extremity supported Standing balance-Leahy Scale: Good     Cognition Arousal/Alertness: Awake/alert Behavior During Therapy: WFL for tasks assessed/performed Overall Cognitive Status: Within Functional Limits for tasks assessed    General Comments: Pt is A and O x 4. Cooperative and pleasant throughout               Pertinent Vitals/Pain Pain Assessment Pain Assessment: No/denies pain Pain Score: 0-No pain     PT Goals (current goals can now be found in the care plan section) Acute Rehab PT Goals Patient Stated Goal: rehab then home Progress towards PT goals: Progressing toward goals    Frequency    BID      PT Plan Current plan remains appropriate  AM-PAC PT "6 Clicks" Mobility   Outcome Measure  Help needed turning from your back to your side while in a flat bed without using bedrails?: A Little Help needed moving from lying on your back to sitting on the side of a flat bed  without using bedrails?: A Little Help needed moving to and from a bed to a chair (including a wheelchair)?: A Little Help needed standing up from a chair using your arms (e.g., wheelchair or bedside chair)?: A Little Help needed to walk in hospital room?: A Little Help needed climbing 3-5 steps with a railing? : A Lot 6 Click Score: 17    End of Session Equipment Utilized During Treatment: Gait belt Activity Tolerance: Patient tolerated treatment well Patient left: in chair;with call bell/phone within reach;with chair alarm set Nurse Communication: Mobility status;Precautions;Weight bearing status PT Visit Diagnosis: Other abnormalities of gait and mobility (R26.89);Muscle weakness (generalized) (M62.81);History of falling (Z91.81);Pain Pain - Right/Left: Left Pain - part of body: Hip     Time: 2993-7169 PT Time Calculation (min) (ACUTE ONLY): 8 min  Charges:  $Gait Training: 8-22 mins                     Julaine Fusi PTA 06/22/21, 12:12 PM

## 2021-06-22 NOTE — Progress Notes (Signed)
Attempted to call report on patient, placed on hold x 5 minutes. Will retry shortly.

## 2021-06-22 NOTE — Discharge Summary (Signed)
Physician Discharge Summary  Erin Hayes JSH:[id number] DOB: [birth year] DOA: 06/18/2021  PCP: Erin Fenton, NP  Admit date: 06/18/2021 Discharge date: 06/22/2021  Time spent: 60 minutes  Recommendations for Outpatient Follow-up:  Follow-up with MD at SNF.  Patient will need a basic metabolic profile, CBC done in 1 week. Follow-up with Dr. Sharlet Hayes, orthopedics in 2 weeks.   Discharge Diagnoses:  Principal Problem:   Closed left hip fracture ([place]) Active Problems:   Hypertensive urgency   DM (diabetes mellitus), type 2 (HCC)   Essential hypertension   Hypercholesterolemia   CHF (congestive heart failure) ([place])   Acute postoperative anemia due to expected blood loss   Discharge Condition: Stable and improved  Diet recommendation: Carb modified diet  Filed Weights   06/18/21 1646 06/18/21 2000 06/19/21 1413  Weight: 54.4 kg 53.5 kg 54.4 kg    History of present illness:  HPI per Dr. Ferman Hayes is a 56 y.o. African-American female with medical history significant for chronic combined systolic and diastolic CHF, type 2 diabetes mellitus, GERD, dyslipidemia, hypertension and nonischemic cardiomyopathy, who presented to the ER with acute onset of left hip pain.  The patient stated that she was going out of the door when her walking boot got caught and she fell on her left side, with subsequent left hip pain and inability to ambulate.  She had a left foot fracture about a year ago that healed and then she fell on it with subsequent swelling close to 3 months ago when she started wearing a boot on her left foot.  She denies any paresthesias or focal muscle weakness.  She denied any chest pain or palpitations.  No headache or dizziness or blurred vision.  No presyncope or syncope.  No dysuria, oliguria or hematuria or flank pain.  No cough or wheezing or hemoptysis.  ED Course: When she came to the ER BP was 181/112 with temperature of 99 and otherwise normal vital signs.   Labs revealed borderline potassium of 3.5 and a calcium of 8.7.  Hemoglobin was 11.9 hematocrit 37.8.  Respiratory panel is currently pending.   EKG as reviewed by me : Pending Imaging: Noncontrasted head CT scan revealed no acute intracranial normalities.  2 view left femur x-ray showed basicervical femoral neck fracture.  Left forearm x-ray was negative. Left hip CT showed nondisplaced, noncomminuted fracture of the left femoral neck extending from medial mid femoral neck to the superior subcapital portion associated with mild valgus and apex anterior angulation.  It showed no other fractures and normally aligned left hip joint.  The patient was given 4 mg of IV morphine sulfate and 4 mg of IV Zofran as well as 10 mg of IV Reglan and 50 mcg of IV fentanyl.  She will be admitted to a medical-surgical bed for further evaluation and management.  Hospital Course:  #1 closed left femoral neck fracture -Secondary to mechanical fall. -Patient seen in consultation by orthopedics and patient status post FNS of left femoral neck on 06/19/2021. -Orthopedics following and recommended touchdown weightbearing left lower extremity, Lovenox x2 weeks for DVT prophylaxis then transition to aspirin with outpatient follow-up 2 weeks post discharge for repeat x-rays and staple removal. -Pain management per orthopedics. -Patient was seen by PT/OT. -Patient will be discharged to SNF  2.  Hypertensive urgency -Hypertensive urgency resolved. -Patient maintained on home regimen of Entresto, Aldactone, carvedilol, Lasix.  IV labetalol as needed.  3.  Hyperlipidemia -Patient maintained on home regimen statin.  4.  Well-controlled diabetes mellitus type 2 -Hemoglobin A1c 7.2 (05/11/2020) -Repeat hemoglobin A1c 6.5.Erin Hayes -Patient noted to have a CBG of 69 and as such home regimen of Semglee decreased to 12 units daily.   -Patient maintained on sliding scale insulin.  5.  Chronic combined CHF -Compensated. -Patient  maintained on home regimen Lasix, Aldactone, Coreg, Entresto.  6.  Acute postop acute blood loss anemia -Hemoglobin at 9.6 from 11.9 on admission. -Patient with no overt bleeding -Anemia panel obtained with a iron level of 27, TIBC of 305, ferritin of 50, folate of 7.1. -Hemoglobin stabilized at 10.7 by day of discharge.   -Outpatient follow-up.     Procedures: Left femoral neck FNS per Dr. Sharlet Hayes 06/19/2021 CT left hip 06/18/2021 CT head 06/18/2021 Plain film of the left hip and pelvis 06/18/2021, 06/19/2021 Plain films of the left forearm 06/18/2021 Plain films of the left femur 06/18/2021  Consultations: Orthopedics: Dr. Sharlet Hayes 06/19/2021  Discharge Exam: Vitals:   06/22/21 0801 06/22/21 1217  BP: 123/83 104/74  Pulse: 71 77  Resp: 16 15  Temp: 98.1 F (36.7 C) 98.9 F (37.2 C)  SpO2: 99% 95%    General: NAD Cardiovascular: RRR no murmurs rubs or gallops.  No JVD.  No lower extremity edema. Respiratory: CTA B.  No wheezes, no crackles, no rhonchi.  Discharge Instructions   Discharge Instructions     Diet Carb Modified   Complete by: As directed    Discharge wound care:   Complete by: As directed    As above   Increase activity slowly   Complete by: As directed    TDWB LLE      Allergies as of 06/22/2021   No Known Allergies      Medication List     STOP taking these medications    metFORMIN 500 MG tablet Commonly known as: GLUCOPHAGE       TAKE these medications    albuterol 108 (90 Base) MCG/ACT inhaler Commonly known as: VENTOLIN HFA Inhale 2 puffs into the lungs every 4 (four) hours as needed for wheezing or shortness of breath.   aspirin EC 81 MG tablet Take 1 tablet (81 mg total) by mouth daily. Swallow whole. What changed: You were already taking a medication with the same name, and this prescription was added. Make sure you understand how and when to take each.   aspirin 81 MG tablet Take 1 tablet (81 mg total) by mouth  daily. Start taking on: July 08, 2021 What changed: These instructions start on July 08, 2021. If you are unsure what to do until then, ask your doctor or other care provider.   Bydureon BCise 2 MG/0.85ML Auij Generic drug: Exenatide ER Inject 2 mg into the skin once a week.   carvedilol 12.5 MG tablet Commonly known as: COREG Take 1.5 tablets (18.75 mg total) by mouth 2 (two) times daily.   cetirizine 10 MG tablet Commonly known as: ZYRTEC Take 1 tablet (10 mg total) by mouth daily.   Contour Test test strip Generic drug: glucose blood by Other route Two (2) times a day.   enoxaparin 40 MG/0.4ML injection Commonly known as: LOVENOX Inject 0.4 mLs (40 mg total) into the skin daily for 14 days. Do NOT expel air bubble from syringe before giving. Start taking on: June 23, 2021   Eucrisa 2 % Oint Generic drug: Crisaborole Apply to aa's BID.   feeding supplement (GLUCERNA SHAKE) Liqd Take 237 mLs by mouth 3 (three) times daily between meals.  fluticasone 50 MCG/ACT nasal spray Commonly known as: FLONASE Place 2 sprays into both nostrils daily. Use for 4-6 weeks then stop and use seasonally or as needed.   furosemide 20 MG tablet Commonly known as: LASIX Take 1 tablet (20 mg total) by mouth daily.   ibuprofen 400 MG tablet Commonly known as: ADVIL Take 400 mg by mouth 3 (three) times daily.   insulin glargine 100 UNIT/ML Solostar Pen Commonly known as: LANTUS Inject 12 Units into the skin at bedtime. What changed: how much to take   Insulin Pen Needle 31G X 5 MM Misc Use as directed with Lantus   loperamide 1 MG/5ML solution Commonly known as: IMODIUM Take 5 mLs (1 mg total) by mouth daily.   meloxicam 15 MG tablet Commonly known as: MOBIC Take 1 tablet (15 mg total) by mouth daily.   multivitamin with minerals Tabs tablet Take 1 tablet by mouth daily. Start taking on: June 23, 2021   ondansetron 4 MG tablet Commonly known as: ZOFRAN Take 1 tablet  (4 mg total) by mouth every 8 (eight) hours as needed for nausea or vomiting.   oxyCODONE 5 MG immediate release tablet Commonly known as: Oxy IR/ROXICODONE Take 1-2 tablets (5-10 mg total) by mouth every 4 (four) hours as needed for breakthrough pain ((for MODERATE breakthrough pain)).   rosuvastatin 20 MG tablet Commonly known as: CRESTOR Take 1 tablet (20 mg total) by mouth daily.   sacubitril-valsartan 49-51 MG Commonly known as: ENTRESTO Take 1 tablet by mouth 2 (two) times daily.   spironolactone 25 MG tablet Commonly known as: ALDACTONE Take 1 tablet (25 mg total) by mouth daily.   tacrolimus 0.1 % ointment Commonly known as: PROTOPIC Apply topically 2 (two) times daily.   traZODone 50 MG tablet Commonly known as: DESYREL Take 0.5 tablets (25 mg total) by mouth at bedtime as needed for sleep.   triamcinolone cream 0.1 % Commonly known as: KENALOG Apply to aa's BID x 2 weeks. Then decrease use to BID 5d/wk.               Durable Medical Equipment  (From admission, onward)           Start     Ordered   06/22/21 1253  For home use only DME 3 n 1  Once        06/22/21 1252   06/22/21 1252  For home use only DME Walker rolling  Once       Question Answer Comment  Walker: With 5 Inch Wheels   Patient needs a walker to treat with the following condition Hip fracture ([place])      06/22/21 1252              Discharge Care Instructions  (From admission, onward)           Start     Ordered   06/22/21 0000  Discharge wound care:       Comments: As above   06/22/21 1359           No Known Allergies  Contact information for follow-up providers     MD AT SNF Follow up.          Renee Harder, MD Follow up in 2 week(s).   Specialty: Orthopedic Surgery Contact information: [place] [address] [phone]              Contact information for after-discharge care     Destination  [facility] Preferred [facility] .   Service: Skilled Nursing Contact information: [address] [place] [phone]                      The results of significant diagnostics from this hospitalization (including imaging, microbiology, ancillary and laboratory) are listed below for reference.    Significant Diagnostic Studies: DG Forearm Left  Result Date: 06/18/2021 CLINICAL DATA:  Slipped and fell.  Forearm pain. EXAM: LEFT FOREARM - 2 VIEW COMPARISON:  None. FINDINGS: There is no evidence of fracture or other focal bone lesions. Soft tissues are unremarkable. IMPRESSION: Negative. Electronically Signed   By: Nelson Chimes M.D.   On: 06/18/2021 17:27   CT HEAD WO CONTRAST (5MM)  Result Date: 06/18/2021 CLINICAL DATA:  Slipped and fell onto the left side. Complaining of left hip and thigh pain and left arm pain. EXAM: CT HEAD WITHOUT CONTRAST TECHNIQUE: Contiguous axial images were obtained from the base of the skull through the vertex without intravenous contrast. RADIATION DOSE REDUCTION: This exam was performed according to the departmental dose-optimization program which includes automated exposure control, adjustment of the mA and/or kV according to patient size and/or use of iterative reconstruction technique. COMPARISON:  04/25/2013. FINDINGS: Brain: No evidence of acute infarction, hemorrhage, hydrocephalus, extra-axial collection or mass lesion/mass effect. Mild periventricular white matter hypoattenuation posteriorly, consistent with chronic microvascular ischemic change. Vascular: No hyperdense vessel or unexpected calcification. Skull: Normal. Negative for fracture or focal lesion. Sinuses/Orbits: Globes and orbits are unremarkable. Visualized sinuses are clear. Other: None. IMPRESSION: 1. No acute intracranial abnormalities. Electronically Signed   By: Lajean Manes M.D.   On: 06/18/2021 18:21   CT Hip Left Wo Contrast  Result Date:  06/18/2021 CLINICAL DATA:  Fall. Left-sided hip pain. Femoral neck fracture on radiographs. EXAM: CT OF THE LEFT HIP WITHOUT CONTRAST TECHNIQUE: Multidetector CT imaging of the left hip was performed according to the standard protocol. Multiplanar CT image reconstructions were also generated. RADIATION DOSE REDUCTION: This exam was performed according to the departmental dose-optimization program which includes automated exposure control, adjustment of the mA and/or kV according to patient size and/or use of iterative reconstruction technique. COMPARISON:  Current left hip radiographs. FINDINGS: Bones/Joint/Cartilage Nondisplaced, non comminuted fracture of the left femoral neck. Fracture extends from the inferior medial aspect of the mid femoral neck to the superior subcapital portion. There is mild valgus angulation. There is also mild apex anterior angulation. No other fractures.  No bone lesions. Hip joint is normally spaced and aligned. Ligaments Suboptimally assessed by CT. Muscles and Tendons No muscle injury/hematoma.  Tendons are grossly intact. Soft tissues No soft tissue mass or contusion. No acute findings noted within the visualized pelvis. IMPRESSION: 1. Nondisplaced, non comminuted fracture of the left femoral neck extending from the medial mid femoral neck to the superior subcapital portion, associated with mild valgus and apex anterior angulation. 2. No other fractures.  Normally aligned left hip joint. Electronically Signed   By: Lajean Manes M.D.   On: 06/18/2021 18:25   DG C-Arm 1-60 Min-No Report  Result Date: 06/19/2021 Fluoroscopy was utilized by the requesting physician.  No radiographic interpretation.   DG HIP UNILAT WITH PELVIS 2-3 VIEWS LEFT  Result Date: 06/19/2021 CLINICAL DATA:  Left femoral neck nondisplaced fracture EXAM: DG HIP (WITH OR WITHOUT PELVIS) 2-3V LEFT COMPARISON:  06/18/2021 FINDINGS: Spot fluoroscopic views demonstrate operative fixation hardware traversing the  intertrochanteric region into the femoral head.  Anatomic alignment. No complicating feature. Expected postoperative changes. IMPRESSION: Status post ORIF of the left femoral neck fracture. Anatomic alignment. Electronically Signed   By: Jerilynn Mages.  Shick M.D.   On: 06/19/2021 17:08   DG Hip Unilat W or Wo Pelvis 2-3 Views Left  Result Date: 06/18/2021 CLINICAL DATA:  Slipped and fell.  Left-sided pain. EXAM: DG HIP (WITH OR WITHOUT PELVIS) 2-3V LEFT COMPARISON:  CT abdomen 10/03/2016 FINDINGS: Suspicion of acute rami fractures on the left. Suspicion also of nondisplaced basicervical femoral neck fracture. Consider CT scan for confirmation. IMPRESSION: Nondisplaced basicervical fracture of the femoral neck. Suspicion also of superior and inferior rami fractures on the left. Electronically Signed   By: Nelson Chimes M.D.   On: 06/18/2021 17:29   DG Femur Min 2 Views Left  Result Date: 06/18/2021 CLINICAL DATA:  [place].  Left-sided pain. EXAM: LEFT FEMUR 2 VIEWS COMPARISON:  Hip imaging same day. FINDINGS: No evidence of femur fracture distal to the nondisplaced basicervical femoral neck fracture. IMPRESSION: Basicervical femoral neck fracture.  No fracture distal to that. Electronically Signed   By: Nelson Chimes M.D.   On: 06/18/2021 17:30    Microbiology: Recent Results (from the past 240 hour(s))  Resp Panel by RT-PCR (Flu A&B, Covid) Nasopharyngeal Swab     Status: None   Collection Time: 06/18/21  7:50 PM   Specimen: Nasopharyngeal Swab; Nasopharyngeal(NP) swabs in vial transport medium  Result Value Ref Range Status   SARS Coronavirus 2 by RT PCR NEGATIVE NEGATIVE Final    Comment: (NOTE) SARS-CoV-2 target nucleic acids are NOT DETECTED.  The SARS-CoV-2 RNA is generally detectable in upper respiratory specimens during the acute phase of infection. The lowest concentration of SARS-CoV-2 viral copies this assay can detect is 138 copies/mL. A negative result does not preclude SARS-Cov-2 infection and  should not be used as the sole basis for treatment or other patient management decisions. A negative result may occur with  improper specimen collection/handling, submission of specimen other than nasopharyngeal swab, presence of viral mutation(s) within the areas targeted by this assay, and inadequate number of viral copies(<138 copies/mL). A negative result must be combined with clinical observations, patient history, and epidemiological information. The expected result is Negative.  Fact Sheet for Patients:  EntrepreneurPulse.com.au  Fact Sheet for Healthcare Providers:  IncredibleEmployment.be  This test is no t yet approved or cleared by the [place] FDA and  has been authorized for detection and/or diagnosis of SARS-CoV-2 by FDA under an Emergency Use Authorization (EUA). This EUA will remain  in effect (meaning this test can be used) for the duration of the COVID-19 declaration under Section 564(b)(1) of the Act, 21 U.S.C.section 360bbb-3(b)(1), unless the authorization is terminated  or revoked sooner.       Influenza A by PCR NEGATIVE NEGATIVE Final   Influenza B by PCR NEGATIVE NEGATIVE Final    Comment: (NOTE) The Xpert Xpress SARS-CoV-2/FLU/RSV plus assay is intended as an aid in the diagnosis of influenza from Nasopharyngeal swab specimens and should not be used as a sole basis for treatment. Nasal washings and aspirates are unacceptable for Xpert Xpress SARS-CoV-2/FLU/RSV testing.  Fact Sheet for Patients: EntrepreneurPulse.com.au  Fact Sheet for Healthcare Providers: IncredibleEmployment.be  This test is not yet approved or cleared by the [place] FDA and has been authorized for detection and/or diagnosis of SARS-CoV-2 by FDA under an Emergency Use Authorization (EUA). This EUA will remain in effect (meaning this test can be used) for the duration of the  COVID-19 declaration  under Section 564(b)(1) of the Act, 21 U.S.C. section 360bbb-3(b)(1), unless the authorization is terminated or revoked.  Performed at [facility], [address], [place], [place] [address]   Surgical PCR screen     Status: None   Collection Time: 06/18/21  8:39 PM   Specimen: Nasal Mucosa; Nasal Swab  Result Value Ref Range Status   MRSA, PCR NEGATIVE NEGATIVE Final   Staphylococcus aureus NEGATIVE NEGATIVE Final    Comment: (NOTE) The Xpert SA Assay (FDA approved for NASAL specimens in patients 26 years of age and older), is one component of a comprehensive surveillance program. It is not intended to diagnose infection nor to guide or monitor treatment. Performed at [facility], [address] [place], [place] [address]   Resp Panel by RT-PCR (Flu A&B, Covid) Nasopharyngeal Swab     Status: None   Collection Time: 06/22/21 10:19 AM   Specimen: Nasopharyngeal Swab; Nasopharyngeal(NP) swabs in vial transport medium  Result Value Ref Range Status   SARS Coronavirus 2 by RT PCR NEGATIVE NEGATIVE Final    Comment: (NOTE) SARS-CoV-2 target nucleic acids are NOT DETECTED.  The SARS-CoV-2 RNA is generally detectable in upper respiratory specimens during the acute phase of infection. The lowest concentration of SARS-CoV-2 viral copies this assay can detect is 138 copies/mL. A negative result does not preclude SARS-Cov-2 infection and should not be used as the sole basis for treatment or other patient management decisions. A negative result may occur with  improper specimen collection/handling, submission of specimen other than nasopharyngeal swab, presence of viral mutation(s) within the areas targeted by this assay, and inadequate number of viral copies(<138 copies/mL). A negative result must be combined with clinical observations, patient history, and epidemiological information. The expected result is Negative.  Fact Sheet for Patients:   EntrepreneurPulse.com.au  Fact Sheet for Healthcare Providers:  IncredibleEmployment.be  This test is no t yet approved or cleared by the [place] FDA and  has been authorized for detection and/or diagnosis of SARS-CoV-2 by FDA under an Emergency Use Authorization (EUA). This EUA will remain  in effect (meaning this test can be used) for the duration of the COVID-19 declaration under Section 564(b)(1) of the Act, 21 U.S.C.section 360bbb-3(b)(1), unless the authorization is terminated  or revoked sooner.       Influenza A by PCR NEGATIVE NEGATIVE Final   Influenza B by PCR NEGATIVE NEGATIVE Final    Comment: (NOTE) The Xpert Xpress SARS-CoV-2/FLU/RSV plus assay is intended as an aid in the diagnosis of influenza from Nasopharyngeal swab specimens and should not be used as a sole basis for treatment. Nasal washings and aspirates are unacceptable for Xpert Xpress SARS-CoV-2/FLU/RSV testing.  Fact Sheet for Patients: EntrepreneurPulse.com.au  Fact Sheet for Healthcare Providers: IncredibleEmployment.be  This test is not yet approved or cleared by the [place] FDA and has been authorized for detection and/or diagnosis of SARS-CoV-2 by FDA under an Emergency Use Authorization (EUA). This EUA will remain in effect (meaning this test can be used) for the duration of the COVID-19 declaration under Section 564(b)(1) of the Act, 21 U.S.C. section 360bbb-3(b)(1), unless the authorization is terminated or revoked.  Performed at [facility], [address] [place], [place] [address]      Labs: Basic Metabolic Panel: Recent Labs  Lab 06/18/21 1747 06/19/21 5102 06/20/21 0602 06/21/21 0546 06/21/21 0854 06/22/21 0615  NA 136 137 138 139  --  138  K 3.5 3.7 3.9 3.2*  --  4.1  CL 102 109 110 110  --  107  CO2 26 23 20* 23  --  26  GLUCOSE 122* 135* 239* 95  --  96  BUN 20 19 23* 19   --  17  CREATININE 0.94 0.89 0.96 1.01*  --  1.00  CALCIUM 8.7* 7.8* 7.8* 7.8*  --  8.3*  MG  --   --   --   --  1.8 1.8   Liver Function Tests: Recent Labs  Lab 06/18/21 1747  AST 19  ALT 16  ALKPHOS 97  BILITOT 0.8  PROT 7.0  ALBUMIN 3.8   No results for input(s): LIPASE, AMYLASE in the last 168 hours. No results for input(s): AMMONIA in the last 168 hours. CBC: Recent Labs  Lab 06/18/21 1747 06/19/21 0623 06/20/21 0602 06/21/21 0546 06/22/21 0615  WBC 7.8 8.3 10.7* 9.6 8.7  NEUTROABS 5.3  --   --   --   --   HGB 11.9* 10.5* 11.3* 9.6* 10.7*  HCT 37.8 33.6* 35.5* 30.4* 33.9*  MCV 86.7 88.2 88.3 85.9 86.5  PLT 338 252 262 240 269   Cardiac Enzymes: No results for input(s): CKTOTAL, CKMB, CKMBINDEX, TROPONINI in the last 168 hours. BNP: BNP (last 3 results) No results for input(s): BNP in the last 8760 hours.  ProBNP (last 3 results) No results for input(s): PROBNP in the last 8760 hours.  CBG: Recent Labs  Lab 06/21/21 2012 06/22/21 0049 06/22/21 0426 06/22/21 0728 06/22/21 1203  GLUCAP 141* 128* 96 97 183*       Signed:  Irine Seal MD.  [facility] 06/22/2021, 2:08 PM

## 2021-07-11 ENCOUNTER — Ambulatory Visit: Payer: Medicare Other | Admitting: Dermatology

## 2021-07-11 ENCOUNTER — Telehealth: Payer: Self-pay | Admitting: Internal Medicine

## 2021-07-11 NOTE — Telephone Encounter (Signed)
I will, however patient needs to schedule a hospital follow-up with me ?

## 2021-07-11 NOTE — Telephone Encounter (Signed)
Erin Hayes with [facility] called and stated that patient has received orders for home health PT and skilled nursing and would like to know if [facility] will follow patient. Please advise. Patient is scheduled for a start of service tomorrow 07/12/21.  ?

## 2021-07-12 NOTE — Telephone Encounter (Signed)
Santiago Glad advised.  Pt has an appointment 07/13/2021 at [facility]  ? ?Thanks,  ? ?-Mickel Baas  ?

## 2021-07-13 ENCOUNTER — Other Ambulatory Visit: Payer: Self-pay

## 2021-07-13 ENCOUNTER — Other Ambulatory Visit: Payer: Self-pay | Admitting: Internal Medicine

## 2021-07-13 ENCOUNTER — Encounter: Payer: Self-pay | Admitting: Internal Medicine

## 2021-07-13 ENCOUNTER — Ambulatory Visit (INDEPENDENT_AMBULATORY_CARE_PROVIDER_SITE_OTHER): Payer: Medicare Other | Admitting: Internal Medicine

## 2021-07-13 VITALS — BP 158/96 | HR 77 | Temp 97.1°F | Wt 110.0 lb

## 2021-07-13 DIAGNOSIS — S72002D Fracture of unspecified part of neck of left femur, subsequent encounter for closed fracture with routine healing: Secondary | ICD-10-CM | POA: Diagnosis not present

## 2021-07-13 DIAGNOSIS — D62 Acute posthemorrhagic anemia: Secondary | ICD-10-CM

## 2021-07-13 DIAGNOSIS — I160 Hypertensive urgency: Secondary | ICD-10-CM

## 2021-07-13 DIAGNOSIS — N6452 Nipple discharge: Secondary | ICD-10-CM

## 2021-07-13 NOTE — Patient Instructions (Signed)
Hip Exercises °Ask your health care provider which exercises are safe for you. Do exercises exactly as told by your health care provider and adjust them as directed. It is normal to feel mild stretching, pulling, tightness, or discomfort as you do these exercises. Stop right away if you feel sudden pain or your pain gets worse. Do not begin these exercises until told by your health care provider. °Stretching and range-of-motion exercises °These exercises warm up your muscles and joints and improve the movement and flexibility of your hip. These exercises also help to relieve pain, numbness, and tingling. You may be asked to limit your range of motion if you had a hip replacement. Talk to your health care provider about these restrictions. °Hamstrings, supine ° °Lie on your back (supine position). °Loop a belt or towel over the ball of your left / right foot. The ball of your foot is on the walking surface, right under your toes. °Straighten your left / right knee and slowly pull on the belt or towel to raise your leg until you feel a gentle stretch behind your knee (hamstring). °Do not let your knee bend while you do this. °Keep your other leg flat on the floor. °Hold this position for __________ seconds. °Slowly return your leg to the starting position. °Repeat __________ times. Complete this exercise __________ times a day. °Hip rotation ° °Lie on your back on a firm surface. °With your left / right hand, gently pull your left / right knee toward the shoulder that is on the same side of the body. Stop when your knee is pointing toward the ceiling. °Hold your left / right ankle with your other hand. °Keeping your knee steady, gently pull your left / right ankle toward your other shoulder until you feel a stretch in your buttocks. °Keep your hips and shoulders firmly planted while you do this stretch. °Hold this position for __________ seconds. °Repeat __________ times. Complete this exercise __________ times a  day. °Seated stretch °This exercise is sometimes called hamstrings and adductors stretch. °Sit on the floor with your legs stretched wide. Keep your knees straight during this exercise. °Keeping your head and back in a straight line, bend at your waist to reach for your left foot (position A). You should feel a stretch in your right inner thigh (adductors). °Hold this position for __________ seconds. Then slowly return to the upright position. °Keeping your head and back in a straight line, bend at your waist to reach forward (position B). You should feel a stretch behind both of your thighs and knees (hamstrings). °Hold this position for __________ seconds. Then slowly return to the upright position. °Keeping your head and back in a straight line, bend at your waist to reach for your right foot (position C). You should feel a stretch in your left inner thigh (adductors). °Hold this position for __________ seconds. Then slowly return to the upright position. °Repeat __________ times. Complete this exercise __________ times a day. °Lunge °This exercise stretches the muscles of the hip (hip flexors). °Place your left / right knee on the floor and bend your other knee so that is directly over your ankle. You should be half-kneeling. °Keep good posture with your head over your shoulders. °Tighten your buttocks to point your tailbone downward. This will prevent your back from arching too much. °You should feel a gentle stretch in the front of your left / right thigh and hip. If you do not feel a stretch, slide your other foot   forward slightly and then slowly lunge forward with your chest up until your knee once again lines up over your ankle. °Make sure your tailbone continues to point downward. °Hold this position for __________ seconds. °Slowly return to the starting position. °Repeat __________ times. Complete this exercise __________ times a day. °Strengthening exercises °These exercises build strength and endurance  in your hip. Endurance is the ability to use your muscles for a long time, even after they get tired. °Bridge °This exercise strengthens the muscles of your hip (hip extensors). °Lie on your back on a firm surface with your knees bent and your feet flat on the floor. °Tighten your buttocks muscles and lift your bottom off the floor until the trunk of your body and your hips are level with your thighs. °Do not arch your back. °You should feel the muscles working in your buttocks and the back of your thighs. If you do not feel these muscles, slide your feet 1-2 inches (2.5-5 cm) farther away from your buttocks. °Hold this position for __________ seconds. °Slowly lower your hips to the starting position. °Let your muscles relax completely between repetitions. °Repeat __________ times. Complete this exercise __________ times a day. °Straight leg raises, side-lying °This exercise strengthens the muscles that move the hip joint away from the center of the body (hip abductors). °Lie on your side with your left / right leg in the top position. Lie so your head, shoulder, hip, and knee line up. You may bend your bottom knee slightly to help you balance. °Roll your hips slightly forward, so your hips are stacked directly over each other and your left / right knee is facing forward. °Leading with your heel, lift your top leg 4-6 inches (10-15 cm). You should feel the muscles in your top hip lifting. °Do not let your foot drift forward. °Do not let your knee roll toward the ceiling. °Hold this position for __________ seconds. °Slowly return to the starting position. °Let your muscles relax completely between repetitions. °Repeat __________ times. Complete this exercise __________ times a day. °Straight leg raises, side-lying °This exercise strengthens the muscles that move the hip joint toward the center of the body (hip adductors). °Lie on your side with your left / right leg in the bottom position. Lie so your head, shoulder,  hip, and knee line up. You may place your upper foot in front to help you balance. °Roll your hips slightly forward, so your hips are stacked directly over each other and your left / right knee is facing forward. °Tense the muscles in your inner thigh and lift your bottom leg 4-6 inches (10-15 cm). °Hold this position for __________ seconds. °Slowly return to the starting position. °Let your muscles relax completely between repetitions. °Repeat __________ times. Complete this exercise __________ times a day. °Straight leg raises, supine °This exercise strengthens the muscles in the front of your thigh (quadriceps). °Lie on your back (supine position) with your left / right leg extended and your other knee bent. °Tense the muscles in the front of your left / right thigh. You should see your kneecap slide up or see increased dimpling just above your knee. °Keep these muscles tight as you raise your leg 4-6 inches (10-15 cm) off the floor. Do not let your knee bend. °Hold this position for __________ seconds. °Keep these muscles tense as you lower your leg. °Relax the muscles slowly and completely between repetitions. °Repeat __________ times. Complete this exercise __________ times a day. °Hip abductors, standing °This   exercise strengthens the muscles that move the leg and hip joint away from the center of the body (hip abductors). °Tie one end of a rubber exercise band or tubing to a secure surface, such as a chair, table, or pole. °Loop the other end of the band or tubing around your left / right ankle. °Keeping your ankle with the band or tubing directly opposite the secured end, step away until there is tension in the tubing or band. Hold on to a chair, table, or pole as needed for balance. °Lift your left / right leg out to your side. While you do this: °Keep your back upright. °Keep your shoulders over your hips. °Keep your toes pointing forward. °Make sure to use your hip muscles to slowly lift your leg. Do not  tip your body or forcefully lift your leg. °Hold this position for __________ seconds. °Slowly return to the starting position. °Repeat __________ times. Complete this exercise __________ times a day. °Squats °This exercise strengthens the muscles in the front of your thigh (quadriceps). °Stand in a door frame so your feet and knees are in line with the frame. You may place your hands on the frame for balance. °Slowly bend your knees and lower your hips like you are going to sit in a chair. °Keep your lower legs in a straight-up-and-down position. °Do not let your hips go lower than your knees. °Do not bend your knees lower than told by your health care provider. °If your hip pain increases, do not bend as low. °Hold this position for ___________ seconds. °Slowly push with your legs to return to standing. Do not use your hands to pull yourself to standing. °Repeat __________ times. Complete this exercise __________ times a day. °This information is not intended to replace advice given to you by your health care provider. Make sure you discuss any questions you have with your health care provider. °Document Revised: 09/07/2020 Document Reviewed: 09/07/2020 °Elsevier Patient Education © 2022 [employer] ° °

## 2021-07-13 NOTE — Progress Notes (Signed)
Subjective:    Patient ID: Erin [facility], female    DOB: 02-07-1966, 56 y.o.   MRN: [id number]  HPI  Patient presents to clinic today for hospital follow-up.  She presented to the ER 2/12 after a fall.  She reported left hip pain.  She was found to be hypertensive with a BP of 181/112.  Labs revealed a mild anemia.  Her head CT was normal.  X-ray of her left hip showed a left femoral neck fracture which was confirmed by CT.  She had a negative x-ray of her left forearm.  She was admitted and Ortho was consulted.  She underwent a FNS of the left femoral neck fracture 2/13.  She was started on Lovenox for 2 weeks and then eventually transition to oral aspirin.  She was discharged to 2/16 2 SNF for rehab.  She was discharged from rehab on 07/15/2021.  Since that time, she has been staying at home and has someone there to help her.  She reports her pain is controlled.  She is taking aspirin as prescribed.  She is able to bear weight without much difficulty.  She starts physical therapy next week.  Her biggest concern is the pain, swelling and numbness of her left ankle.  This has been an ongoing issue.  She reports she mentioned this to orthopedics but they did not seem overly concerned about it.  She has a follow-up with Dr. Okey Dupre in 2 weeks.  Her BP today is 156/96.  She reports she was rushing to get out of the house this morning and did not take her antihypertensive medications.  Review of Systems     Past Medical History:  Diagnosis Date   Carpal tunnel syndrome of right wrist    Chronic combined systolic (congestive) and diastolic (congestive) heart failure (HCC)    a. 03/2017 Echo: EF 20-25%, diff HK, Gr2 DD; b. 09/2017 Echo: EF 20-25%, diff HK, Gr2 DD.   Diabetes (HCC)    GERD (gastroesophageal reflux disease)    HLD (hyperlipidemia)    Hypertension    NICM (nonischemic cardiomyopathy) (HCC)    a. 03/2017 Echo: EF 20-25%, diff HK, Gr2 DD, mild to mod MR, nl RV fxn;  b. 03/2017 Cath:  mild nonobs dzs, EF 25%; c. 09/2017 Echo: EF 20-25%, diff HK. Gr2 DD. Mild MR. Mildly reduced RV fxn.   Pleural effusion, left    a. 03/2017 s/p thoracentesis-->300 ml withdrawn--transudative.   Pneumonia 02/19/2017    Current Outpatient Medications  Medication Sig Dispense Refill   albuterol (VENTOLIN HFA) 108 (90 Base) MCG/ACT inhaler Inhale 2 puffs into the lungs every 4 (four) hours as needed for wheezing or shortness of breath. 6.7 g 1   aspirin 81 MG tablet Take 1 tablet (81 mg total) by mouth daily. 30 tablet    aspirin EC 81 MG tablet Take 1 tablet (81 mg total) by mouth daily. Swallow whole. 150 tablet 2   carvedilol (COREG) 12.5 MG tablet Take 1.5 tablets (18.75 mg total) by mouth 2 (two) times daily. 270 tablet 3   cetirizine (ZYRTEC) 10 MG tablet Take 1 tablet (10 mg total) by mouth daily. 30 tablet 11   Crisaborole (EUCRISA) 2 % OINT Apply to aa's BID. 100 g 3   enoxaparin (LOVENOX) 40 MG/0.4ML injection Inject 0.4 mLs (40 mg total) into the skin daily for 14 days. Do NOT expel air bubble from syringe before giving. 0 mL    Exenatide ER (BYDUREON BCISE) 2 MG/0.85ML AUIJ  Inject 2 mg into the skin once a week. 4 pen 1   feeding supplement, GLUCERNA SHAKE, (GLUCERNA SHAKE) LIQD Take 237 mLs by mouth 3 (three) times daily between meals.  0   fluticasone (FLONASE) 50 MCG/ACT nasal spray Place 2 sprays into both nostrils daily. Use for 4-6 weeks then stop and use seasonally or as needed. 16 g 3   furosemide (LASIX) 20 MG tablet Take 1 tablet (20 mg total) by mouth daily. 90 tablet 3   glucose blood (CONTOUR TEST) test strip by Other route Two (2) times a day.     ibuprofen (ADVIL) 400 MG tablet Take 400 mg by mouth 3 (three) times daily.     insulin glargine (LANTUS) 100 UNIT/ML Solostar Pen Inject 12 Units into the skin at bedtime. 15 mL 3   Insulin Pen Needle 31G X 5 MM MISC Use as directed with Lantus 100 each 3   loperamide (IMODIUM) 1 MG/5ML solution Take 5 mLs (1 mg total) by mouth  daily. 120 mL 0   meloxicam (MOBIC) 15 MG tablet Take 1 tablet (15 mg total) by mouth daily. 90 tablet 1   Multiple Vitamin (MULTIVITAMIN WITH MINERALS) TABS tablet Take 1 tablet by mouth daily.     ondansetron (ZOFRAN) 4 MG tablet Take 1 tablet (4 mg total) by mouth every 8 (eight) hours as needed for nausea or vomiting. 20 tablet 0   oxyCODONE (OXY IR/ROXICODONE) 5 MG immediate release tablet Take 1-2 tablets (5-10 mg total) by mouth every 4 (four) hours as needed for breakthrough pain ((for MODERATE breakthrough pain)). 10 tablet 0   rosuvastatin (CRESTOR) 20 MG tablet Take 1 tablet (20 mg total) by mouth daily. 90 tablet 3   sacubitril-valsartan (ENTRESTO) 49-51 MG Take 1 tablet by mouth 2 (two) times daily. 30 tablet 3   spironolactone (ALDACTONE) 25 MG tablet Take 1 tablet (25 mg total) by mouth daily. 90 tablet 2   tacrolimus (PROTOPIC) 0.1 % ointment Apply topically 2 (two) times daily. 100 g 2   traZODone (DESYREL) 50 MG tablet Take 0.5 tablets (25 mg total) by mouth at bedtime as needed for sleep.     triamcinolone cream (KENALOG) 0.1 % Apply to aa's BID x 2 weeks. Then decrease use to BID 5d/wk. 453.6 g 1   No current facility-administered medications for this visit.    No Known Allergies  Family History  Problem Relation Age of Onset   Lung cancer Mother    Hypertension Father    CAD Father        a. MI age 32   Heart disease Father    COPD Neg Hx    Diabetes Mellitus II Neg Hx     Social History   Socioeconomic History   Marital status: Single    Spouse name: Not on file   Number of children: 3   Years of education: 12   Highest education level: 12th grade  Occupational History   Occupation: Materials engineer  Tobacco Use   Smoking status: Never   Smokeless tobacco: Never  Vaping Use   Vaping Use: Never used  Substance and Sexual Activity   Alcohol use: No   Drug use: No   Sexual activity: Never  Other Topics Concern   Not on file  Social History Narrative    Not on file   Social Determinants of Health   Financial Resource Strain: Not on file  Food Insecurity: Not on file  Transportation Needs: Not on file  Physical Activity: Not on file  Stress: Not on file  Social Connections: Not on file  Intimate Partner Violence: Not on file     Constitutional: Denies fever, malaise, fatigue, headache or abrupt weight changes.  Respiratory: Denies difficulty breathing, shortness of breath, cough or sputum production.   Cardiovascular: Denies chest pain, chest tightness, palpitations or swelling in the hands or feet.  Musculoskeletal: Patient reports pain and swelling of her left ankle, slight difficulty with gait.  Denies decrease in range of motion, muscle pain .  Skin: Denies redness, rashes, lesions or ulcercations.  Neurological: Patient reports chronic numbness of her LLE.  Denies dizziness, difficulty with memory, difficulty with speech or problems with coordination.    No other specific complaints in a complete review of systems (except as listed in HPI above).  Objective:   Physical Exam  BP (!) 158/96 (BP Location: Right Arm, Patient Position: Sitting, Cuff Size: Normal)    Pulse 77    Temp (!) 97.1 F (36.2 C) (Temporal)    Wt 110 lb (49.9 kg) Comment: Per pt.   SpO2 98%    BMI 22.22 kg/m   Wt Readings from Last 3 Encounters:  06/19/21 120 lb (54.4 kg)  05/15/21 118 lb 6.4 oz (53.7 kg)  09/09/20 119 lb (54 kg)    General: Appears her stated age, well developed, well nourished in NAD. Skin: Warm, dry and intact. No redness or warmth of the left lower extremity noted. HEENT: Head: normal shape and size; Eyes: EOMs intact;  Cardiovascular: Normal rate and rhythm. S1,S2 noted.  No murmur, rubs or gallops noted.  Trace pitting LE edema.  Pulmonary/Chest: Normal effort and positive vesicular breath sounds. No respiratory distress. No wheezes, rales or ronchi noted.  Abdomen: Soft and nontender. Musculoskeletal: Normal flexion, extension  and rotation of the left ankle but with pain.  1+ swelling noted of the left foot and left ankle.  No pain with palpation of the left trochanter.  Unable to assess ROM as she is currently in a wheelchair Neurological: Alert and oriented.    BMET    Component Value Date/Time   NA 138 06/22/2021 0615   NA 142 06/19/2018 1905   NA 129 (L) 04/25/2013 2004   K 4.1 06/22/2021 0615   K 5.7 (H) 04/25/2013 2004   CL 107 06/22/2021 0615   CL 97 (L) 04/25/2013 2004   CO2 26 06/22/2021 0615   CO2 12 (L) 04/25/2013 2004   GLUCOSE 96 06/22/2021 0615   GLUCOSE 370 (H) 04/25/2013 2004   BUN 17 06/22/2021 0615   BUN 14 06/19/2018 1905   BUN 17 04/25/2013 2004   CREATININE 1.00 06/22/2021 0615   CREATININE 0.96 03/28/2020 1044   CALCIUM 8.3 (L) 06/22/2021 0615   CALCIUM 9.7 04/25/2013 2004   GFRNONAA >60 06/22/2021 0615   GFRNONAA 66 03/28/2020 1044   GFRAA 77 03/28/2020 1044    Lipid Panel     Component Value Date/Time   CHOL 248 (H) 03/28/2020 1044   CHOL 216 (H) 06/19/2018 1905   TRIG 173 (H) 03/28/2020 1044   HDL 59 03/28/2020 1044   HDL 53 06/19/2018 1905   CHOLHDL 4.2 03/28/2020 1044   LDLCALC 158 (H) 03/28/2020 1044    CBC    Component Value Date/Time   WBC 8.7 06/22/2021 0615   RBC 3.92 06/22/2021 0615   HGB 10.7 (L) 06/22/2021 0615   HGB 11.6 06/19/2018 1905   HCT 33.9 (L) 06/22/2021 0615   HCT 35.4  06/19/2018 1905   PLT 269 06/22/2021 0615   PLT 435 06/19/2018 1905   MCV 86.5 06/22/2021 0615   MCV 85 06/19/2018 1905   MCV 88 04/25/2013 2004   MCH 27.3 06/22/2021 0615   MCHC 31.6 06/22/2021 0615   RDW 13.9 06/22/2021 0615   RDW 12.4 06/19/2018 1905   RDW 12.3 04/25/2013 2004   LYMPHSABS 1.7 06/18/2021 1747   MONOABS 0.6 06/18/2021 1747   EOSABS 0.1 06/18/2021 1747   BASOSABS 0.1 06/18/2021 1747    Hgb A1C Lab Results  Component Value Date   HGBA1C 6.5 (H) 06/22/2021            Assessment & Plan:   Hospital follow-up for Left Femoral Neck  Fracture, Hypertensive Urgency, Acute Postoperative Blood Loss Anemia:  Hospital notes, labs and imaging reviewed CBC and c-Met were repeated in the SNF, reviewed his labs today She will continue pain medication per Ortho Continue Aspirin per Ortho Encouraged her to work with PT as previously scheduled Advised her to take her blood pressure medications as prescribed She will follow-up with orthopedics as previously scheduled   RTC in 10 weeks for follow-up of chronic conditions Webb Silversmith, NP This visit occurred during the SARS-CoV-2 [facility] health emergency.  Safety protocols were in place, including screening questions prior to the visit, additional usage of staff PPE, and extensive cleaning of exam room while observing appropriate contact time as indicated for disinfecting solutions.

## 2021-07-21 ENCOUNTER — Telehealth: Payer: Self-pay | Admitting: Internal Medicine

## 2021-07-21 ENCOUNTER — Telehealth: Payer: Self-pay

## 2021-07-21 NOTE — Telephone Encounter (Signed)
Home Health Verbal Orders - Caller/Agency: [facility] ? ?Callback Number: [phone] ? ?Requesting OT/PT/Skilled Nursing/Social Work/Speech Therapy: PT ? ?Frequency: 2 times week for 2 weeks and 1 time for 6 weeks  ? ?They would also like for her to have a glucometer ?

## 2021-07-21 NOTE — Telephone Encounter (Signed)
Hoyle Sauer Physical therapist with [facility] called and wanted to verify if any weight bearing restrictions with cam boot or any other restrictions.  Please advise ?

## 2021-07-24 MED ORDER — RELION CONFIRM GLUCOSE MONITOR W/DEVICE KIT: PACK | 0 refills | Status: DC

## 2021-07-24 NOTE — Telephone Encounter (Signed)
Verbal orders given to [facility] with [facility].  ? ?RX for glucose monitor sent to [employer]. ? ? ?Thanks,  ? ?-Mickel Baas  ?

## 2021-07-24 NOTE — Telephone Encounter (Signed)
Okay for verbal orders as requested and okay to send in a glucometer ?

## 2021-07-24 NOTE — Addendum Note (Signed)
Addended by: Ashley Royalty E on: 07/24/2021 03:17 PM ? ? Modules accepted: Orders ? ?

## 2021-08-29 ENCOUNTER — Telehealth: Payer: Self-pay | Admitting: Internal Medicine

## 2021-08-29 NOTE — Telephone Encounter (Signed)
Erin Hayes from [facility] called stating they have to end services for this pt. Pt is going back to work, still needs PT, Nursing, and OT... but will have to do outpatient. Pt is also requesting a rollator, shower bench, and wheel chair. This order can be faxed to a DME company..  ? ?Also note, I have no idea who her PCP is because it was [facility].  ? ?Erin Hayes gave me her CB number and said it's ok to leave message.  ?[phone] ?

## 2021-08-30 NOTE — Telephone Encounter (Signed)
She has to be seen in person for a mobility assessment for a wheelchair. Please have her schedule a follow up appt with me ?

## 2021-08-30 NOTE — Telephone Encounter (Signed)
Apt scheduled for 09/05/2021 at 10am. ? ?Thanks,  ? ?-Mickel Baas  ?

## 2021-09-05 ENCOUNTER — Ambulatory Visit: Admission: RE | Admit: 2021-09-05 | Payer: Medicare Other | Source: Ambulatory Visit

## 2021-09-05 ENCOUNTER — Ambulatory Visit (INDEPENDENT_AMBULATORY_CARE_PROVIDER_SITE_OTHER): Payer: Medicare Other | Admitting: Internal Medicine

## 2021-09-05 ENCOUNTER — Ambulatory Visit: Payer: Medicare Other

## 2021-09-05 ENCOUNTER — Encounter: Payer: Self-pay | Admitting: Internal Medicine

## 2021-09-05 VITALS — BP 128/88 | HR 87 | Temp 96.0°F | Wt 113.0 lb

## 2021-09-05 DIAGNOSIS — M7989 Other specified soft tissue disorders: Secondary | ICD-10-CM

## 2021-09-05 DIAGNOSIS — I10 Essential (primary) hypertension: Secondary | ICD-10-CM

## 2021-09-05 DIAGNOSIS — I5022 Chronic systolic (congestive) heart failure: Secondary | ICD-10-CM

## 2021-09-05 DIAGNOSIS — E119 Type 2 diabetes mellitus without complications: Secondary | ICD-10-CM | POA: Diagnosis not present

## 2021-09-05 DIAGNOSIS — I82562 Chronic embolism and thrombosis of left calf muscular vein: Secondary | ICD-10-CM

## 2021-09-05 DIAGNOSIS — R269 Unspecified abnormalities of gait and mobility: Secondary | ICD-10-CM

## 2021-09-05 DIAGNOSIS — Z794 Long term (current) use of insulin: Secondary | ICD-10-CM

## 2021-09-05 DIAGNOSIS — E7800 Pure hypercholesterolemia, unspecified: Secondary | ICD-10-CM

## 2021-09-05 DIAGNOSIS — I428 Other cardiomyopathies: Secondary | ICD-10-CM

## 2021-09-05 NOTE — Progress Notes (Signed)
? ?Subjective:  ? ? Patient ID: Erin Hayes, female    DOB: 02/17/1966, 56 y.o.   MRN: [id number] ? ?HPI ? ?Patient presents to clinic today for follow-up of chronic conditions. ? ?HTN with NICM: Her BP today is 128/88.  She is taking Carvedilol, Furosemide, Entresto and Spironolactone as prescribed.  ECG from 06/2021 reviewed. ? ?HLD: Her last LDL was 158, triglycerides 173, 03/2019.  She denies myalgias on Rosuvastatin.  She does not consume a low-fat diet. ? ?CHF: She denies chronic cough, shortness of breath but does intermittently have some lower extremity edema.  She is taking Carvedilol, Furosemide, Entresto and Spironolactone as prescribed.  Echo from 05/2019 reviewed. ? ?DM2: Her last A1c was 6.5%, 09/2020.  She does not check her sugars.  She is taking Metformin and Bydureon as prescribed.  She does not check her feet routinely.  Her last eye exam was in 2023, [place].  Flu 03/2020.  Pneumovax 09/2020.  COVID Moderna x2. ? ?She would also like to discuss getting a sliding shower bench, wheelchair and rolling walker.  She is still having limited weightbearing on her left lower extremity status post ORIF from hip fracture 06/2021.  She is not currently working with physical therapy.  She has been following up with orthopedics but think she needs a second opinion. ? ?Review of Systems ? ?   ?Past Medical History:  ?Diagnosis Date  ? Carpal tunnel syndrome of right wrist   ? Chronic combined systolic (congestive) and diastolic (congestive) heart failure (HCC)   ? a. 03/2017 Echo: EF 20-25%, diff HK, Gr2 DD; b. 09/2017 Echo: EF 20-25%, diff HK, Gr2 DD.  ? Diabetes ([place])   ? GERD (gastroesophageal reflux disease)   ? HLD (hyperlipidemia)   ? Hypertension   ? NICM (nonischemic cardiomyopathy) ([place])   ? a. 03/2017 Echo: EF 20-25%, diff HK, Gr2 DD, mild to mod MR, nl RV fxn;  b. 03/2017 Cath: mild nonobs dzs, EF 25%; c. 09/2017 Echo: EF 20-25%, diff HK. Gr2 DD. Mild MR. Mildly reduced RV fxn.  ? Pleural effusion,  left   ? a. 03/2017 s/p thoracentesis-->300 ml withdrawn--transudative.  ? Pneumonia 02/19/2017  ? ? ?Current Outpatient Medications  ?Medication Sig Dispense Refill  ? albuterol (VENTOLIN HFA) 108 (90 Base) MCG/ACT inhaler Inhale 2 puffs into the lungs every 4 (four) hours as needed for wheezing or shortness of breath. 6.7 g 1  ? aspirin 81 MG tablet Take 1 tablet (81 mg total) by mouth daily. 30 tablet   ? Blood Glucose Monitoring Suppl (RELION CONFIRM GLUCOSE MONITOR) w/Device KIT Use to check blood sugar daily.  DX: E11.9 1 kit 0  ? carvedilol (COREG) 12.5 MG tablet Take 1.5 tablets (18.75 mg total) by mouth 2 (two) times daily. 270 tablet 3  ? cetirizine (ZYRTEC) 10 MG tablet Take 1 tablet (10 mg total) by mouth daily. 30 tablet 11  ? Crisaborole (EUCRISA) 2 % OINT Apply to aa's BID. 100 g 3  ? enoxaparin (LOVENOX) 40 MG/0.4ML injection Inject 0.4 mLs (40 mg total) into the skin daily for 14 days. Do NOT expel air bubble from syringe before giving. 0 mL   ? Exenatide ER (BYDUREON BCISE) 2 MG/0.85ML AUIJ Inject 2 mg into the skin once a week. 4 pen 1  ? feeding supplement, GLUCERNA SHAKE, (GLUCERNA SHAKE) LIQD Take 237 mLs by mouth 3 (three) times daily between meals.  0  ? fluticasone (FLONASE) 50 MCG/ACT nasal spray Place 2 sprays into both  nostrils daily. Use for 4-6 weeks then stop and use seasonally or as needed. 16 g 3  ? furosemide (LASIX) 20 MG tablet Take 1 tablet (20 mg total) by mouth daily. 90 tablet 3  ? glucose blood (CONTOUR TEST) test strip by Other route Two (2) times a day.    ? ibuprofen (ADVIL) 400 MG tablet Take 400 mg by mouth 3 (three) times daily.    ? insulin glargine (LANTUS) 100 UNIT/ML Solostar Pen Inject 12 Units into the skin at bedtime. 15 mL 3  ? Insulin Pen Needle 31G X 5 MM MISC Use as directed with Lantus 100 each 3  ? loperamide (IMODIUM) 1 MG/5ML solution Take 5 mLs (1 mg total) by mouth daily. 120 mL 0  ? meloxicam (MOBIC) 15 MG tablet Take 1 tablet (15 mg total) by mouth  daily. 90 tablet 1  ? Multiple Vitamin (MULTIVITAMIN WITH MINERALS) TABS tablet Take 1 tablet by mouth daily.    ? ondansetron (ZOFRAN) 4 MG tablet Take 1 tablet (4 mg total) by mouth every 8 (eight) hours as needed for nausea or vomiting. 20 tablet 0  ? oxyCODONE (OXY IR/ROXICODONE) 5 MG immediate release tablet Take 1-2 tablets (5-10 mg total) by mouth every 4 (four) hours as needed for breakthrough pain ((for MODERATE breakthrough pain)). 10 tablet 0  ? rosuvastatin (CRESTOR) 20 MG tablet Take 1 tablet (20 mg total) by mouth daily. 90 tablet 3  ? sacubitril-valsartan (ENTRESTO) 49-51 MG Take 1 tablet by mouth 2 (two) times daily. 30 tablet 3  ? spironolactone (ALDACTONE) 25 MG tablet Take 1 tablet (25 mg total) by mouth daily. 90 tablet 2  ? tacrolimus (PROTOPIC) 0.1 % ointment Apply topically 2 (two) times daily. 100 g 2  ? traZODone (DESYREL) 50 MG tablet Take 0.5 tablets (25 mg total) by mouth at bedtime as needed for sleep.    ? triamcinolone cream (KENALOG) 0.1 % Apply to aa's BID x 2 weeks. Then decrease use to BID 5d/wk. 453.6 g 1  ? ?No current facility-administered medications for this visit.  ? ? ?No Known Allergies ? ?Family History  ?Problem Relation Age of Onset  ? Lung cancer Mother   ? Hypertension Father   ? CAD Father   ?     a. MI age 16  ? Heart disease Father   ? COPD Neg Hx   ? Diabetes Mellitus II Neg Hx   ? ? ?Social History  ? ?Socioeconomic History  ? Marital status: Single  ?  Spouse name: Not on file  ? Number of children: 3  ? Years of education: 44  ? Highest education level: 12th grade  ?Occupational History  ? Occupation: home maker  ?Tobacco Use  ? Smoking status: Never  ? Smokeless tobacco: Never  ?Vaping Use  ? Vaping Use: Never used  ?Substance and Sexual Activity  ? Alcohol use: No  ? Drug use: No  ? Sexual activity: Never  ?Other Topics Concern  ? Not on file  ?Social History Narrative  ? Not on file  ? ?Social Determinants of Health  ? ?Financial Resource Strain: Not on file   ?Food Insecurity: Not on file  ?Transportation Needs: Not on file  ?Physical Activity: Not on file  ?Stress: Not on file  ?Social Connections: Not on file  ?Intimate Partner Violence: Not on file  ? ? ? ?Constitutional: Denies fever, malaise, fatigue, headache or abrupt weight changes.  ?Respiratory: Denies difficulty breathing, shortness of breath, cough or  sputum production.   ?Cardiovascular: Pt reports swelling in legs. Denies chest pain, chest tightness, palpitations or swelling in the hands.  ?Musculoskeletal: Patient reports left hip pain, difficulty with gait.  Denies muscle pain or joint  swelling.  ?Skin: Denies redness, rashes, lesions or ulcercations.  ?Neurological: Patient reports difficulty with balance.  Denies dizziness, difficulty with memory, difficulty with speech or problems with coordination.  ? ? ?No other specific complaints in a complete review of systems (except as listed in HPI above). ? ?Objective:  ? Physical Exam ?BP 128/88 (BP Location: Left Arm, Patient Position: Sitting, Cuff Size: Normal)   Pulse 87   Temp (!) 96 ?F (35.6 ?C) (Temporal)   Wt 113 lb (51.3 kg)   SpO2 100%   BMI 22.82 kg/m?  ? ?Wt Readings from Last 3 Encounters:  ?07/13/21 110 lb (49.9 kg)  ?06/19/21 120 lb (54.4 kg)  ?05/15/21 118 lb 6.4 oz (53.7 kg)  ? ? ?General: Appears her stated age, chronically ill-appearing, in NAD. ?Skin: Warm, dry and intact. No ulcerations noted. ?HEENT: Head: normal shape and size; Eyes: sclera white, no icterus, conjunctiva pink, PERRLA and EOMs intact;  ?Cardiovascular: Normal rate and rhythm. S1,S2 noted.  No murmur, rubs or gallops noted.  1+ pitting LE edema. No carotid bruits noted. ?Pulmonary/Chest: Normal effort and positive vesicular breath sounds. No respiratory distress. No wheezes, rales or ronchi noted.  ?Musculoskeletal: In wheelchair.  She is not fully weightbearing on the LLE. ?Neurological: Alert and oriented.  ? ? ? ?BMET ?   ?Component Value Date/Time  ? NA 138  06/22/2021 0615  ? NA 142 06/19/2018 1905  ? NA 129 (L) 04/25/2013 2004  ? K 4.1 06/22/2021 0615  ? K 5.7 (H) 04/25/2013 2004  ? CL 107 06/22/2021 0615  ? CL 97 (L) 04/25/2013 2004  ? CO2 26 06/22/2021 0615  ? C

## 2021-09-05 NOTE — Assessment & Plan Note (Signed)
Continue Carvedilol, Furosemide, Entresto and Spironolactone ?Encourage low-salt diet ? ?

## 2021-09-05 NOTE — Assessment & Plan Note (Signed)
A1c and urine microalbumin today ?Encouraged her to consume a low-carb diet and exercise weight loss ?Continue Metformin and Bydureon ?We will request copy of eye exam ?Encourage routine foot exam ?Encouraged her to get a flu shot in the fall ?Pneumovax UTD ?Encouraged her to get a COVID booster ?

## 2021-09-05 NOTE — Assessment & Plan Note (Signed)
Continue Carvedilol, Furosemide, Entresto and Spironolactone ?Encourage low-salt diet ?She will continue to follow with cardiology ?

## 2021-09-05 NOTE — Assessment & Plan Note (Signed)
C-Met and lipid profile today Encouraged her to consume a low-fat diet Continue Rosuvastatin 

## 2021-09-05 NOTE — Patient Instructions (Signed)

## 2021-09-05 NOTE — Assessment & Plan Note (Signed)
Localized LLE edema, we will be ruling out DVT ?Continue Carvedilol, Furosemide, Entresto and Spironolactone ?Encourage low-salt diet ?Monitor weights ?She will continue to follow with cardiology ?

## 2021-09-06 ENCOUNTER — Ambulatory Visit
Admission: RE | Admit: 2021-09-06 | Discharge: 2021-09-06 | Disposition: A | Discharge status: Home / Self Care | Payer: Medicare Other | Source: Ambulatory Visit | Attending: Internal Medicine | Admitting: Internal Medicine

## 2021-09-06 DIAGNOSIS — M7989 Other specified soft tissue disorders: Secondary | ICD-10-CM | POA: Diagnosis not present

## 2021-09-07 ENCOUNTER — Telehealth: Payer: Self-pay

## 2021-09-07 MED ORDER — APIXABAN (ELIQUIS) VTE STARTER PACK (10MG AND 5MG): ORAL_TABLET | ORAL | 0 refills | Status: DC

## 2021-09-07 NOTE — Telephone Encounter (Signed)
This was the patient we discussed earlier, there is also a result note regarding this. ?

## 2021-09-07 NOTE — Addendum Note (Signed)
Addended by: [name] on: 09/07/2021 09:06 AM ? ? Modules accepted: Orders ? ?

## 2021-09-07 NOTE — Telephone Encounter (Signed)
Patient is calling about her ultrasound result. She was told to call her Ortho doctor about the DVT and they told her to call us and she would like a call back ASAP.  [phone] ?

## 2021-09-18 ENCOUNTER — Other Ambulatory Visit: Payer: Self-pay | Admitting: Family Medicine

## 2021-09-18 DIAGNOSIS — I10 Essential (primary) hypertension: Secondary | ICD-10-CM

## 2021-09-18 NOTE — Telephone Encounter (Signed)
Requested Prescriptions  ?Pending Prescriptions Disp Refills  ?? furosemide (LASIX) 20 MG tablet [Pharmacy Med Name: FUROSEMIDE 20 MG TABLET] 90 tablet 0  ?  Sig: Take 1 tablet (20 mg total) by mouth daily.  ?  ? Cardiovascular:  Diuretics - Loop Failed - 09/18/2021  1:53 PM  ?  ?  Failed - Ca in normal range and within 180 days  ?  Calcium  ?Date Value Ref Range Status  ?06/22/2021 8.3 (L) 8.9 - 10.3 mg/dL Final  ? ?Calcium, Total  ?Date Value Ref Range Status  ?04/25/2013 9.7 8.5 - 10.1 mg/dL Final  ?   ?  ?  Passed - K in normal range and within 180 days  ?  Potassium  ?Date Value Ref Range Status  ?06/22/2021 4.1 3.5 - 5.1 mmol/L Final  ?04/25/2013 5.7 (H) 3.5 - 5.1 mmol/L Final  ?   ?  ?  Passed - Na in normal range and within 180 days  ?  Sodium  ?Date Value Ref Range Status  ?06/22/2021 138 135 - 145 mmol/L Final  ?06/19/2018 142 134 - 144 mmol/L Final  ?04/25/2013 129 (L) 136 - 145 mmol/L Final  ?   ?  ?  Passed - Cr in normal range and within 180 days  ?  Creat  ?Date Value Ref Range Status  ?03/28/2020 0.96 0.50 - 1.05 mg/dL Final  ?  Comment:  ?  For patients >10 years of age, the reference limit ?for Creatinine is approximately 13% higher for people ?identified as African-American. ?. ?  ? ?Creatinine, Ser  ?Date Value Ref Range Status  ?06/22/2021 1.00 0.44 - 1.00 mg/dL Final  ?   ?  ?  Passed - Cl in normal range and within 180 days  ?  Chloride  ?Date Value Ref Range Status  ?06/22/2021 107 98 - 111 mmol/L Final  ?04/25/2013 97 (L) 98 - 107 mmol/L Final  ?   ?  ?  Passed - Mg Level in normal range and within 180 days  ?  Magnesium  ?Date Value Ref Range Status  ?06/22/2021 1.8 1.7 - 2.4 mg/dL Final  ?  Comment:  ?  Performed at [facility], [address] [place], [place] [address]  ?   ?  ?  Passed - Last BP in normal range  ?  BP Readings from Last 1 Encounters:  ?09/05/21 128/88  ?   ?  ?  Passed - Valid encounter within last 6 months  ?  Recent Outpatient Visits   ?      ? 1 week ago  Type 2 diabetes mellitus without complication, with long-term current use of insulin ([place])  ? [facility] [place], [place] W, NP  ? 2 months ago Closed fracture of neck of left femur with routine healing, subsequent encounter  ? [facility] [place], [place] W, NP  ? 4 months ago Non-seasonal allergic rhinitis, unspecified trigger  ? [facility] [place], [place] W, NP  ? 1 year ago Uncontrolled type 2 diabetes mellitus with hyperglycemia ([place])  ? [facility] [place] , Coralie Keens, NP  ? 1 year ago Essential hypertension  ? [facility], Lupita Raider, FNP  ?  ?  ? ?  ?  ?  ? ? ?

## 2021-09-19 ENCOUNTER — Other Ambulatory Visit: Payer: Medicare Other

## 2021-09-19 ENCOUNTER — Ambulatory Visit: Payer: Medicare Other | Admitting: Internal Medicine

## 2021-09-19 DIAGNOSIS — Z794 Long term (current) use of insulin: Secondary | ICD-10-CM

## 2021-09-20 ENCOUNTER — Other Ambulatory Visit: Payer: Self-pay

## 2021-09-20 DIAGNOSIS — E119 Type 2 diabetes mellitus without complications: Secondary | ICD-10-CM

## 2021-09-20 MED ORDER — INSULIN GLARGINE 100 UNIT/ML SOLOSTAR PEN: 12.0000 [IU] | PEN_INJECTOR | Freq: Every day | SUBCUTANEOUS | 3 refills | Status: DC

## 2021-09-29 ENCOUNTER — Encounter (INDEPENDENT_AMBULATORY_CARE_PROVIDER_SITE_OTHER): Payer: Self-pay | Admitting: Nurse Practitioner

## 2021-09-29 ENCOUNTER — Ambulatory Visit (INDEPENDENT_AMBULATORY_CARE_PROVIDER_SITE_OTHER): Payer: Medicare Other | Admitting: Nurse Practitioner

## 2021-09-29 VITALS — BP 177/121 | HR 86 | Resp 17

## 2021-09-29 DIAGNOSIS — E7800 Pure hypercholesterolemia, unspecified: Secondary | ICD-10-CM

## 2021-09-29 DIAGNOSIS — I82512 Chronic embolism and thrombosis of left femoral vein: Secondary | ICD-10-CM

## 2021-09-29 DIAGNOSIS — Z794 Long term (current) use of insulin: Secondary | ICD-10-CM

## 2021-09-29 DIAGNOSIS — E119 Type 2 diabetes mellitus without complications: Secondary | ICD-10-CM

## 2021-10-08 ENCOUNTER — Encounter (INDEPENDENT_AMBULATORY_CARE_PROVIDER_SITE_OTHER): Payer: Self-pay | Admitting: Nurse Practitioner

## 2021-10-08 NOTE — Progress Notes (Signed)
Subjective:    Patient ID: Erin Hayes, female    DOB: [birth year], 56 y.o.   MRN: [id number] Chief Complaint  Patient presents with   BCCCP    Referred by Dr Samantha Crimes is a 56 year old female that presents today for evaluation of left leg DVT.  The patient has had leg swelling for approximately a year however recent studies show a DVT of the left femoral and popliteal vein.  The patient underwent hip surgery due to fracture in February of this year.  She has been noting ongoing worsening swelling since that time.  At her follow-up visit with her PCP she was sent for DVT studies which showed that noted thrombus.  The studies were independently reviewed by myself and noted a chronic appearance of the thrombus.   Review of Systems  Cardiovascular:  Positive for leg swelling.  All other systems reviewed and are negative.     Objective:   Physical Exam Vitals reviewed.  HENT:     Head: Normocephalic.  Cardiovascular:     Rate and Rhythm: Normal rate.     Pulses: Normal pulses.  Pulmonary:     Effort: Pulmonary effort is normal.  Musculoskeletal:     Left lower leg: Edema present.  Skin:    General: Skin is warm and dry.  Neurological:     Mental Status: She is alert and oriented to person, place, and time.  Psychiatric:        Mood and Affect: Mood normal.        Behavior: Behavior normal.        Thought Content: Thought content normal.        Judgment: Judgment normal.    BP (!) 177/121 (BP Location: Right Arm)   Pulse 86   Resp 17   Past Medical History:  Diagnosis Date   Carpal tunnel syndrome of right wrist    Chronic combined systolic (congestive) and diastolic (congestive) heart failure ([place])    a. 03/2017 Echo: EF 20-25%, diff HK, Gr2 DD; b. 09/2017 Echo: EF 20-25%, diff HK, Gr2 DD.   Diabetes ([place])    GERD (gastroesophageal reflux disease)    HLD (hyperlipidemia)    Hypertension    NICM (nonischemic cardiomyopathy) ([place])    a. 03/2017 Echo: EF  20-25%, diff HK, Gr2 DD, mild to mod MR, nl RV fxn;  b. 03/2017 Cath: mild nonobs dzs, EF 25%; c. 09/2017 Echo: EF 20-25%, diff HK. Gr2 DD. Mild MR. Mildly reduced RV fxn.   Pleural effusion, left    a. 03/2017 s/p thoracentesis-->300 ml withdrawn--transudative.   Pneumonia 02/19/2017    Social History   Socioeconomic History   Marital status: Single    Spouse name: Not on file   Number of children: 3   Years of education: 12   Highest education level: 12th grade  Occupational History   Occupation: Materials engineer  Tobacco Use   Smoking status: Never   Smokeless tobacco: Never  Vaping Use   Vaping Use: Never used  Substance and Sexual Activity   Alcohol use: No   Drug use: No   Sexual activity: Never  Other Topics Concern   Not on file  Social History Narrative   Not on file   Social Determinants of Health   Financial Resource Strain: Not on file  Food Insecurity: Not on file  Transportation Needs: Not on file  Physical Activity: Not on file  Stress: Not on file  Social Connections:  Not on file  Intimate Partner Violence: Not on file    Past Surgical History:  Procedure Laterality Date   COLONOSCOPY WITH PROPOFOL N/A 01/21/2017   Procedure: COLONOSCOPY WITH PROPOFOL;  Surgeon: Jonathon Bellows, MD;  Location: [facility] ENDOSCOPY;  Service: Gastroenterology;  Laterality: N/A;   ESOPHAGOGASTRODUODENOSCOPY (EGD) WITH PROPOFOL N/A 01/21/2017   Procedure: ESOPHAGOGASTRODUODENOSCOPY (EGD) WITH PROPOFOL;  Surgeon: Jonathon Bellows, MD;  Location: [facility] ENDOSCOPY;  Service: Gastroenterology;  Laterality: N/A;   FLEXIBLE SIGMOIDOSCOPY N/A 11/04/2016   Procedure: FLEXIBLE SIGMOIDOSCOPY;  Surgeon: Wilford Corner, MD;  Location: [facility] ENDOSCOPY;  Service: Endoscopy;  Laterality: N/A;   HIP PINNING,CANNULATED Left 06/19/2021   Procedure: CANNULATED HIP PINNING;  Surgeon: Renee Harder, MD;  Location: [facility] ORS;  Service: Orthopedics;  Laterality: Left;   LEFT HEART CATH AND CORONARY ANGIOGRAPHY N/A  04/05/2017   Procedure: LEFT HEART CATH AND CORONARY ANGIOGRAPHY;  Surgeon: Wellington Hampshire, MD;  Location: [facility] CV LAB;  Service: Cardiovascular;  Laterality: N/A;   LITHOTRIPSY      Family History  Problem Relation Age of Onset   Lung cancer Mother    Hypertension Father    CAD Father        a. MI age 14   Heart disease Father    COPD Neg Hx    Diabetes Mellitus II Neg Hx     No Known Allergies     Latest Ref Rng & Units 06/22/2021    6:15 AM 06/21/2021    5:46 AM 06/20/2021    6:02 AM  CBC  WBC 4.0 - 10.5 K/uL 8.7   9.6   10.7    Hemoglobin 12.0 - 15.0 g/dL 10.7   9.6   11.3    Hematocrit 36.0 - 46.0 % 33.9   30.4   35.5    Platelets 150 - 400 K/uL 269   240   262        CMP     Component Value Date/Time   NA 138 06/22/2021 0615   NA 142 06/19/2018 1905   NA 129 (L) 04/25/2013 2004   K 4.1 06/22/2021 0615   K 5.7 (H) 04/25/2013 2004   CL 107 06/22/2021 0615   CL 97 (L) 04/25/2013 2004   CO2 26 06/22/2021 0615   CO2 12 (L) 04/25/2013 2004   GLUCOSE 96 06/22/2021 0615   GLUCOSE 370 (H) 04/25/2013 2004   BUN 17 06/22/2021 0615   BUN 14 06/19/2018 1905   BUN 17 04/25/2013 2004   CREATININE 1.00 06/22/2021 0615   CREATININE 0.96 03/28/2020 1044   CALCIUM 8.3 (L) 06/22/2021 0615   CALCIUM 9.7 04/25/2013 2004   PROT 7.0 06/18/2021 1747   PROT 6.2 06/19/2018 1905   PROT 8.6 (H) 04/25/2013 2004   ALBUMIN 3.8 06/18/2021 1747   ALBUMIN 3.8 06/19/2018 1905   ALBUMIN 3.9 04/25/2013 2004   AST 19 06/18/2021 1747   AST 35 04/25/2013 2004   ALT 16 06/18/2021 1747   ALT 29 04/25/2013 2004   ALKPHOS 97 06/18/2021 1747   ALKPHOS 106 04/25/2013 2004   BILITOT 0.8 06/18/2021 1747   BILITOT 0.3 06/19/2018 1905   BILITOT 0.8 04/25/2013 2004   GFRNONAA >60 06/22/2021 0615   GFRNONAA 66 03/28/2020 1044   GFRAA 77 03/28/2020 1044     No results found.     Assessment & Plan:   1. Chronic deep vein thrombosis (DVT) of femoral vein of left lower extremity  (HCC) Ultrasound renal 09/06/2021 shows evidence of left  lower extremity DVT following recent hip surgery.  The patient has been initiated on Eliquis.  She is advised to continue taking Eliquis twice daily.  Based upon the images reviewed as well as the onset of symptoms, this thrombus is likely older than 62 weeks old which would make thrombectomy likely unsuccessful.  The ultrasound shows there are chronic changes which would not be amenable to thrombectomy.  Based on this the patient is advised to continue with anticoagulation, to utilize compression, elevation and activity as tolerated.  We will have the patient return in 3 months to reevaluate the progress of the DVT.  2. Type 2 diabetes mellitus without complication, with long-term current use of insulin ([place]) Continue hypoglycemic medications as already ordered, these medications have been reviewed and there are no changes at this time.  Hgb A1C to be monitored as already arranged by primary service   3. Hypercholesterolemia Continue statin as ordered and reviewed, no changes at this time    Current Outpatient Medications on File Prior to Visit  Medication Sig Dispense Refill   albuterol (VENTOLIN HFA) 108 (90 Base) MCG/ACT inhaler 2 puff EVERY 4 HOURS (route: inhalation)     APIXABAN (ELIQUIS) VTE STARTER PACK (10MG AND 5MG) Take as directed on package: start with two-33m tablets twice daily for 7 days. On day 8, switch to one-521mtablet twice daily. 1 each 0   Blood Glucose Monitoring Suppl (RELION CONFIRM GLUCOSE MONITOR) w/Device KIT Use to check blood sugar daily.  DX: E11.9 1 kit 0   carvedilol (COREG) 12.5 MG tablet Take 1.5 tablets (18.75 mg total) by mouth 2 (two) times daily. 270 tablet 3   cetirizine (ZYRTEC) 10 MG tablet Take 1 tablet (10 mg total) by mouth daily. 30 tablet 11   Crisaborole (EUCRISA) 2 % OINT Apply to aa's BID. 100 g 3   fluticasone (FLONASE) 50 MCG/ACT nasal spray Place 2 sprays into both nostrils daily. Use for  4-6 weeks then stop and use seasonally or as needed. 16 g 3   furosemide (LASIX) 20 MG tablet Take 1 tablet (20 mg total) by mouth daily. 90 tablet 1   glucose blood (CONTOUR TEST) test strip by Other route Two (2) times a day.     Hyoscyamine Sulfate SL 0.125 MG SUBL hyoscyamine 0.125 mg sublingual tablet     ibuprofen (ADVIL) 200 MG tablet 1 tablet 3 TIMES DAILY (route: oral)     insulin glargine (LANTUS) 100 UNIT/ML Solostar Pen Inject 12 Units into the skin at bedtime. 15 mL 3   Insulin Pen Needle 31G X 5 MM MISC Use as directed with Lantus 100 each 3   Loperamide HCl 1 MG/7.5ML LIQD Imodium A-D 1 mg/7.5 mL oral liquid     meloxicam (MOBIC) 15 MG tablet 1 tablet DAILY (route: oral)     Multiple Vitamin (MULTIVITAMIN WITH MINERALS) TABS tablet Take 1 tablet by mouth daily.     ondansetron (ZOFRAN-ODT) 4 MG disintegrating tablet 1 tablet EVERY 8 HOURS (route: oral)     rosuvastatin (CRESTOR) 20 MG tablet Take 1 tablet (20 mg total) by mouth daily. 90 tablet 3   sacubitril-valsartan (ENTRESTO) 49-51 MG Take 1 tablet by mouth 2 (two) times daily. 30 tablet 3   spironolactone (ALDACTONE) 25 MG tablet Take 1 tablet (25 mg total) by mouth daily. 90 tablet 2   tacrolimus (PROTOPIC) 0.1 % ointment Apply topically 2 (two) times daily. 100 g 2   triamcinolone cream (KENALOG) 0.1 % Apply to aa's BID  x 2 weeks. Then decrease use to BID 5d/wk. 453.6 g 1   Exenatide ER (BYDUREON BCISE) 2 MG/0.85ML AUIJ Inject 2 mg into the skin once a week. (Patient not taking: Reported on 09/29/2021) 4 pen 1   traZODone (DESYREL) 50 MG tablet Take 0.5 tablets (25 mg total) by mouth at bedtime as needed for sleep. (Patient not taking: Reported on 09/29/2021)     No current facility-administered medications on file prior to visit.    There are no Patient Instructions on file for this visit. No follow-ups on file.   Kris Hartmann, NP

## 2021-11-14 NOTE — Progress Notes (Deleted)
. Cardiology Office Note  Date:  11/14/2021   ID:  Erin Hayes, DOB 1965-09-02, MRN [id number]  PCP:  [name], NP   No chief complaint on file.   HPI:  56 year old female with a history of  diabetes,  hypertension,  hyperlipidemia,  chronic diarrhea,  GERD,  pneumonia, 02/2017 chronic combined systolic and diastolic congestive heart failure/nonischemic cardiomyopathy (from PNA?) By cath 03/2017, nonobstructive disease,   EF 15-20% by cath, 20-25% by echo Left pleural effusion Ef up to 45 to 50% in Jan 2021 Who presents for routine follow-up of her nonischemic cardiomyopathy  Last seen in clinic by myself 2022  BP elevated with PMD She does not have blood pressure cuff at home  HBa1c 7.2, much better Now for much higher numbers 1-1/2 years ago, approximately 12  Denies chest pain, no shortness of breath Used a wheelchair to get into the office today In general no complaints  Cholesterol continues to run high, 250 Unclear if she is taking Crestor 20  Echo 05/21/2019 reviewed  1. Left ventricular ejection fraction, by visual estimation, is 45 to  50%. The left ventricle has normal function. There is mildly increased  left ventricular hypertrophy.   EKG personally reviewed by myself on todays visit Shows NSR with ivcd rater 85 bpm , LAFB Unable to exclude old anterior MI, no change from prior EKG January 2021  Prior medical records reviewed on today's visit  ED 02/04/17 due to pneumonia  Admitted 02/18/17 due to LLL pneumonia. Initially needed IV antibiotics & then transitioned to oral antibiotics. Discharged  Admitted 04/03/17 due to acute HF.   IV diuretics  echo and  catheterization by Dr.  Fletcher Anon showing nonischemic cardiomyopathy ejection fraction less than 25%   Recently she reports she has been doing well, denies any lower extremity edema or shortness of breath or abdominal bloating Weight has been stable, Carvedilol is on her list but she does  not have this medication No significant change in her weight Feels almost back to normal   cardiac catheterization November 2018 Ejection fraction 25% or less, mild nonobstructive coronary disease, left ventricular end-diastolic pressure 33   PMH:   has a past medical history of Carpal tunnel syndrome of right wrist, Chronic combined systolic (congestive) and diastolic (congestive) heart failure ([place]), Diabetes ([place]), GERD (gastroesophageal reflux disease), HLD (hyperlipidemia), Hypertension, NICM (nonischemic cardiomyopathy) ([place]), Pleural effusion, left, and Pneumonia (02/19/2017).  PSH:    Past Surgical History:  Procedure Laterality Date   COLONOSCOPY WITH PROPOFOL N/A 01/21/2017   Procedure: COLONOSCOPY WITH PROPOFOL;  Surgeon: Jonathon Bellows, MD;  Location: [facility] ENDOSCOPY;  Service: Gastroenterology;  Laterality: N/A;   ESOPHAGOGASTRODUODENOSCOPY (EGD) WITH PROPOFOL N/A 01/21/2017   Procedure: ESOPHAGOGASTRODUODENOSCOPY (EGD) WITH PROPOFOL;  Surgeon: Jonathon Bellows, MD;  Location: [facility] ENDOSCOPY;  Service: Gastroenterology;  Laterality: N/A;   FLEXIBLE SIGMOIDOSCOPY N/A 11/04/2016   Procedure: FLEXIBLE SIGMOIDOSCOPY;  Surgeon: Wilford Corner, MD;  Location: [facility] ENDOSCOPY;  Service: Endoscopy;  Laterality: N/A;   HIP PINNING,CANNULATED Left 06/19/2021   Procedure: CANNULATED HIP PINNING;  Surgeon: Renee Harder, MD;  Location: [facility] ORS;  Service: Orthopedics;  Laterality: Left;   LEFT HEART CATH AND CORONARY ANGIOGRAPHY N/A 04/05/2017   Procedure: LEFT HEART CATH AND CORONARY ANGIOGRAPHY;  Surgeon: Wellington Hampshire, MD;  Location: [place] CV LAB;  Service: Cardiovascular;  Laterality: N/A;   LITHOTRIPSY      Current Outpatient Medications  Medication Sig Dispense Refill   albuterol (VENTOLIN HFA) 108 (90 Base) MCG/ACT  inhaler Inhale 2 puffs into the lungs every 4 (four) hours as needed for wheezing or shortness of breath. 6.7 g 1   aspirin 81 MG tablet Take 81 mg by mouth daily.      chlorhexidine (PERIDEX) 0.12 % solution 15 mLs 2 (two) times daily.     Exenatide ER (BYDUREON BCISE) 2 MG/0.85ML AUIJ Inject 2 mg into the skin once a week. 4 pen 1   furosemide (LASIX) 20 MG tablet Take 1 tablet (20 mg total) by mouth daily. 90 tablet 3   glucose blood (CONTOUR TEST) test strip by Other route Two (2) times a day.     HYDROcodone-acetaminophen (NORCO/VICODIN) 5-325 MG tablet Take 1 tablet by mouth every 6 (six) hours as needed.     hydrocortisone 2.5 % lotion Apply topically.     Hyoscyamine Sulfate SL 0.125 MG SUBL Dissolve 1-2 tablets on your tongue every 4-6 hours as needed for cramping or diarrhea     ibuprofen (ADVIL) 400 MG tablet Take 400 mg by mouth 3 (three) times daily.     insulin glargine (LANTUS) 100 UNIT/ML Solostar Pen Inject 14 Units into the skin at bedtime. 15 mL 3   Insulin Pen Needle 31G X 5 MM MISC Use as directed with Lantus 100 each 3   loperamide (IMODIUM) 1 MG/5ML solution Take 1 mg by mouth daily.     meloxicam (MOBIC) 15 MG tablet Take 1 tablet (15 mg total) by mouth daily. 90 tablet 1   metFORMIN (GLUCOPHAGE) 500 MG tablet Take 1 tablet (500 mg total) by mouth 2 (two) times daily with a meal. 180 tablet 1   ondansetron (ZOFRAN) 4 MG tablet Take by mouth.     sacubitril-valsartan (ENTRESTO) 49-51 MG Take 1 tablet by mouth 2 (two) times daily. 30 tablet 3   spironolactone (ALDACTONE) 25 MG tablet Take 1 tablet (25 mg total) by mouth daily. 90 tablet 2   carvedilol (COREG) 12.5 MG tablet Take 1. Tablet  2 (two) times daily. 270 tablet 3   rosuvastatin (CRESTOR) 20 MG tablet Take 1 tablet (20 mg total) by mouth daily. 90 tablet 3   No current facility-administered medications for this visit.     Allergies:   Patient has no known allergies.   Social History:  The patient  reports that she has never smoked. She has never used smokeless tobacco. She reports that she does not drink alcohol and does not use drugs.   Family History:   family history  includes CAD in her father; Heart disease in her father; Hypertension in her father; Lung cancer in her mother.    Review of Systems: Review of Systems  Constitutional: Negative.   Respiratory: Negative.    Cardiovascular: Negative.   Gastrointestinal: Negative.   Musculoskeletal: Negative.   Neurological: Negative.   Psychiatric/Behavioral: Negative.    All other systems reviewed and are negative.    PHYSICAL EXAM: VS:  There were no vitals taken for this visit. , BMI There is no height or weight on file to calculate BMI. Constitutional:  oriented to person, place, and time. No distress.  Presenting in a wheelchair HENT:  Head: Grossly normal Eyes:  no discharge. No scleral icterus.  Neck: No JVD, no carotid bruits  Cardiovascular: Regular rate and rhythm, no murmurs appreciated Pulmonary/Chest: Clear to auscultation bilaterally, no wheezes or rails Abdominal: Soft.  no distension.  no tenderness.  Musculoskeletal: Normal range of motion Neurological:  normal muscle tone. Coordination normal. No atrophy Skin: Skin  warm and dry Psychiatric: normal affect, pleasant   Recent Labs: 06/18/2021: ALT 16 06/22/2021: BUN 17; Creatinine, Ser 1.00; Hemoglobin 10.7; Magnesium 1.8; Platelets 269; Potassium 4.1; Sodium 138    Lipid Panel Lab Results  Component Value Date   CHOL 248 (H) 03/28/2020   HDL 59 03/28/2020   LDLCALC 158 (H) 03/28/2020   TRIG 173 (H) 03/28/2020      Wt Readings from Last 3 Encounters:  09/05/21 113 lb (51.3 kg)  07/13/21 110 lb (49.9 kg)  06/19/21 120 lb (54.4 kg)      ASSESSMENT AND PLAN:  Chronic systolic congestive heart failure ([place]) - Plan: EKG stable,  lab work reviewed, appears relatively euvolemic on exam Some report of high blood pressure, recommend she increase carvedilol up to 18.75 mg twice daily He does not have blood pressure cuff at home to monitor Much better number here today compared to primary care We will go slowly with  medication changes  Type 2 diabetes mellitus without complication, without long-term current use of insulin (HCC)  hemoglobin A1c 12 down to 7 Remarkable improvement, discussed with her  NICM (nonischemic cardiomyopathy) ([place]) EF 45 to 50% Stay on entresto Carvedilol, spironolactone, Lasix  Essential hypertension Small changes as above to carvedilol   Recommended booster  Total encounter time more than 25 minutes  Greater than 50% was spent in counseling and coordination of care with the patient    No orders of the defined types were placed in this encounter.    Signed, Esmond Plants, M.D., Ph.D. 11/14/2021  [place], [place]

## 2021-11-15 ENCOUNTER — Ambulatory Visit: Payer: Medicare Other | Admitting: Cardiovascular Disease

## 2021-11-15 DIAGNOSIS — I5022 Chronic systolic (congestive) heart failure: Secondary | ICD-10-CM

## 2021-11-15 DIAGNOSIS — I10 Essential (primary) hypertension: Secondary | ICD-10-CM

## 2021-11-15 DIAGNOSIS — E782 Mixed hyperlipidemia: Secondary | ICD-10-CM

## 2021-11-15 DIAGNOSIS — I428 Other cardiomyopathies: Secondary | ICD-10-CM

## 2021-11-15 DIAGNOSIS — Z794 Long term (current) use of insulin: Secondary | ICD-10-CM

## 2021-11-24 ENCOUNTER — Ambulatory Visit: Payer: Self-pay | Admitting: *Deleted

## 2021-11-24 ENCOUNTER — Emergency Department
Admission: EM | Admit: 2021-11-24 | Discharge: 2021-11-24 | Disposition: A | Discharge status: Home / Self Care | Payer: Medicare Other | Attending: Emergency Medicine | Admitting: Emergency Medicine

## 2021-11-24 ENCOUNTER — Other Ambulatory Visit: Payer: Self-pay

## 2021-11-24 ENCOUNTER — Emergency Department: Payer: Medicare Other

## 2021-11-24 DIAGNOSIS — Z7901 Long term (current) use of anticoagulants: Secondary | ICD-10-CM | POA: Insufficient documentation

## 2021-11-24 DIAGNOSIS — I82402 Acute embolism and thrombosis of unspecified deep veins of left lower extremity: Secondary | ICD-10-CM | POA: Insufficient documentation

## 2021-11-24 DIAGNOSIS — M7989 Other specified soft tissue disorders: Secondary | ICD-10-CM | POA: Diagnosis present

## 2021-11-24 MED ORDER — APIXABAN (ELIQUIS) VTE STARTER PACK (10MG AND 5MG): ORAL_TABLET | ORAL | 0 refills | Status: DC

## 2021-11-24 NOTE — ED Provider Triage Note (Signed)
Emergency Medicine Provider Triage Evaluation Note  Erin Hayes , a 56 y.o. female  was evaluated in triage.  Patient has a history of diabetes, hypertension, hyperlipidemia and DVT.  Patient has experienced some worsening leg pain on the left despite taking Eliquis and is concern for acute on chronic DVT.  Review of Systems  Positive: Patient has leg pain.  Negative: No chest pain or abdominal pain.   Physical Exam  BP (!) 179/101 (BP Location: Right Arm)   Pulse 91   Temp 98.6 F (37 C) (Oral)   Resp 17   SpO2 97%  Gen:   Awake, no distress   Resp:  Normal effort  MSK:   Moves extremities without difficulty  Other:    Medical Decision Making  Medically screening exam initiated at 5:13 PM.  Appropriate orders placed.  Erin Hayes was informed that the remainder of the evaluation will be completed by another provider, this initial triage assessment does not replace that evaluation, and the importance of remaining in the ED until their evaluation is complete.     Vallarie Mare [place], [place] 11/24/21 1714

## 2021-11-24 NOTE — ED Triage Notes (Signed)
Pt presents to ED with c/o of leg pain. Pt states she has has current blood clot to the L leg. Pt states she is taking eliquis.

## 2021-11-24 NOTE — Discharge Instructions (Signed)
Restart Eliquis?

## 2021-11-24 NOTE — Telephone Encounter (Signed)
Summary: Swelling in legs and feet   Pt called reporting that she has swelling in her legs and feet   Best contact: [phone]      Chief Complaint: swollen left lower leg and left foot Symptoms: pain with walking,  Frequency: 1 month Pertinent Negatives: Patient denies chest pain, vision changes or weakness, no breathing difficulty Disposition: '[x]'$ ED /'[]'$ Urgent Care (no appt availability in office) / '[]'$ Appointment(In office/virtual)/ '[]'$  [place] [facility]/ '[]'$ Home Care/ '[]'$ Refused Recommended Disposition /'[]'$ [place] [facility]/ '[]'$  Follow-up with PCP Additional Notes: Advised to go to ED to R/O DVT  Reason for Disposition  [1] Can't walk or can barely walk AND [2] new-onset  Answer Assessment - Initial Assessment Questions 1. ONSET: "When did the swelling start?" (e.g., minutes, hours, days)     1 month 2. LOCATION: "What part of the leg is swollen?"  "Are both legs swollen or just one leg?"     Left leg towards ankle- foot swollen 3. SEVERITY: "How bad is the swelling?" (e.g., localized; mild, moderate, severe)   - Localized: Small area of swelling localized to one leg.   - MILD pedal edema: Swelling limited to foot and ankle, pitting edema < 1/4 inch (6 mm) deep, rest and elevation eliminate most or all swelling.   - MODERATE edema: Swelling of lower leg to knee, pitting edema > 1/4 inch (6 mm) deep, rest and elevation only partially reduce swelling.   - SEVERE edema: Swelling extends above knee, facial or hand swelling present.      moderate 4. REDNESS: "Does the swelling look red or infected?"     no 5. PAIN: "Is the swelling painful to touch?" If Yes, ask: "How painful is it?"   (Scale 1-10; mild, moderate or severe)     Yes-moderate 6. FEVER: "Do you have a fever?" If Yes, ask: "What is it, how was it measured, and when did it start?"      no 7. CAUSE: "What do you think is causing the leg swelling?"     ? DVT left leg 8. MEDICAL HISTORY: "Do you have a history of  blood clots (e.g., DVT), cancer, heart failure, kidney disease, or liver failure?"     yes 9. RECURRENT SYMPTOM: "Have you had leg swelling before?" If Yes, ask: "When was the last time?" "What happened that time?"     Yes - DVT 10. OTHER SYMPTOMS: "Do you have any other symptoms?" (e.g., chest pain, difficulty breathing)       no 11. PREGNANCY: "Is there any chance you are pregnant?" "When was your last menstrual period?"       *No Answer*  Protocols used: Leg Swelling and Edema-A-AH

## 2021-11-24 NOTE — Telephone Encounter (Signed)
Noted, will review ED note

## 2021-11-24 NOTE — ED Provider Notes (Signed)
[facility] Provider Note  Patient Contact: 9:52 PM (approximate)   History   Leg Pain   HPI  Erin Hayes is a 56 y.o. female with a history of prior DVT, presents to the emergency department with left leg swelling.  Patient states that she started Eliquis in May and finished starter pack but did not continue anticoagulation.  Patient states that she has had new progressively worsening left calf pain.  No pleuritic chest pain or shortness of breath.  She has been afebrile.      Physical Exam   Triage Vital Signs: ED Triage Vitals  Enc Vitals Group     BP 11/24/21 1710 (!) 179/101     Pulse Rate 11/24/21 1710 91     Resp 11/24/21 1710 17     Temp 11/24/21 1710 98.6 F (37 C)     Temp Source 11/24/21 1710 Oral     SpO2 11/24/21 1710 97 %     Weight --      Height --      Head Circumference --      Peak Flow --      Pain Score 11/24/21 1713 10     Pain Loc --      Pain Edu? --      Excl. in [place]? --     Most recent vital signs: Vitals:   11/24/21 1710  BP: (!) 179/101  Pulse: 91  Resp: 17  Temp: 98.6 F (37 C)  SpO2: 97%     General: Alert and in no acute distress. Eyes:  PERRL. EOMI. Head: No acute traumatic findings ENT:      Ears:       Nose: No congestion/rhinnorhea.      Mouth/Throat: Mucous membranes are moist. Neck: No stridor. No cervical spine tenderness to palpation. Cardiovascular:  Good peripheral perfusion Respiratory: Normal respiratory effort without tachypnea or retractions. Lungs CTAB. Good air entry to the bases with no decreased or absent breath sounds. Gastrointestinal: Bowel sounds 4 quadrants. Soft and nontender to palpation. No guarding or rigidity. No palpable masses. No distention. No CVA tenderness. Musculoskeletal: Full range of motion to all extremities.  Neurologic:  No gross focal neurologic deficits are appreciated.  Skin: Patient has left calf pain with palpation.    ED Results / Procedures /  Treatments   Labs (all labs ordered are listed, but only abnormal results are displayed) Labs Reviewed - No data to display      RADIOLOGY  I personally viewed and evaluated these images as part of my medical decision making, as well as reviewing the written report by the radiologist.  ED Provider Interpretation:  Patient has a nonocclusive DVT involving the profunda femoral vein, popliteal vein and posterior tibial vein     PROCEDURES:  Critical Care performed: No  Procedures   MEDICATIONS ORDERED IN ED: Medications - No data to display   IMPRESSION / MDM / [place] / ED COURSE  I reviewed the triage vital signs and the nursing notes.                              Assessment and plan Calf pain 56 year old female presents to the emergency department with left calf pain.  Patient was hypertensive at triage but vital signs were otherwise reassuring.  Patient was alert, active and nontoxic-appearing.  Venous ultrasound concerning for DVT.  Will restart patient on Eliquis.  Return precautions were given to return with new or worsening symptoms.      FINAL CLINICAL IMPRESSION(S) / ED DIAGNOSES   Final diagnoses:  Acute deep vein thrombosis (DVT) of left lower extremity, unspecified vein (HCC)     Rx / DC Orders   ED Discharge Orders          Ordered    APIXABAN (ELIQUIS) VTE STARTER PACK ('10MG'$  AND '5MG'$ )        11/24/21 2148             Note:  This document was prepared using Dragon voice recognition software and may include unintentional dictation errors.   Vallarie Mare [place], [place] 11/24/21 2155    Merlyn Lot, MD 11/24/21 [phone]

## 2021-11-24 NOTE — Telephone Encounter (Signed)
Summary: Swelling in legs and feet   Pt called reporting that she has swelling in her legs and feet      Voicemail left.

## 2021-11-24 NOTE — Telephone Encounter (Signed)
FYI

## 2021-12-25 ENCOUNTER — Ambulatory Visit: Payer: Medicare Other | Admitting: Internal Medicine

## 2021-12-28 ENCOUNTER — Other Ambulatory Visit (INDEPENDENT_AMBULATORY_CARE_PROVIDER_SITE_OTHER): Payer: Self-pay | Admitting: Nurse Practitioner

## 2021-12-28 DIAGNOSIS — I82512 Chronic embolism and thrombosis of left femoral vein: Secondary | ICD-10-CM

## 2022-01-02 ENCOUNTER — Encounter (INDEPENDENT_AMBULATORY_CARE_PROVIDER_SITE_OTHER): Payer: Medicare Other

## 2022-01-02 ENCOUNTER — Ambulatory Visit (INDEPENDENT_AMBULATORY_CARE_PROVIDER_SITE_OTHER): Payer: Medicare Other | Admitting: Nurse Practitioner

## 2022-01-02 NOTE — Progress Notes (Unsigned)
Cardiology Office Note  Date:  01/03/2022   ID:  Erin Hayes, DOB 20-Apr-1966, MRN [id number]  PCP:  [name], NP   Chief Complaint  Patient presents with   12 month follow up     "Doing well." Medications reviewed by the patient verbally.     HPI:  56 year old female with a history of  diabetes,  hypertension,  hyperlipidemia,  chronic diarrhea,  GERD,  pneumonia, 02/2017 chronic combined systolic and diastolic congestive heart failure/nonischemic cardiomyopathy (from PNA?) By cath 03/2017, nonobstructive disease,   EF 15-20% by cath, 20-25% by echo Left pleural effusion Ef up to 45 to 50% in Jan 2021 DVT  5/23 and 7/23 following closed displaced fracture left femoral neck 2/23 Who presents for routine follow-up of her nonischemic cardiomyopathy  Last seen in clinic by myself January 2022 February 2023 closed displaced fracture left femoral neck Ultrasound Sep 06, 2021 DVT Ultrasound November 24, 2021 positive for DVT lower extremity  Seen in the emergency room November 24, 2021 for left leg swelling, diagnosed with DVT Ultrasound concerning for DVT, was restarted on Eliquis  Has appt with PMD 9/8, running out of eliquis  Has swelling left leg  BP elevated today Did not take meds this Am, usually takes with food but did not eat yet Has blood pressure cuff at home, has not been checking  PMD to check labs next week  Using walker to get around, followed by left leg pain when she walks presumably from DVT  Blood work reviewed A1c 6.5 in feb 2023,  trending down  EKG personally reviewed by myself on todays visit Normal sinus rhythm rate 88 bpm consider old anterior MI, old inferior MI Other Past medical history reviewed Echo 05/21/2019 reviewed  1. Left ventricular ejection fraction, by visual estimation, is 45 to  50%. The left ventricle has normal function. There is mildly increased  left ventricular hypertrophy.   Prior medical records reviewed on today's  visit  ED 02/04/17 due to pneumonia  Admitted 02/18/17 due to LLL pneumonia. Initially needed IV antibiotics & then transitioned to oral antibiotics. Discharged  Admitted 04/03/17 due to acute HF.   IV diuretics  echo and  catheterization by Dr.  Fletcher Anon showing nonischemic cardiomyopathy ejection fraction less than 25%   Recently she reports she has been doing well, denies any lower extremity edema or shortness of breath or abdominal bloating Weight has been stable, Carvedilol is on her list but she does not have this medication No significant change in her weight Feels almost back to normal   cardiac catheterization November 2018 Ejection fraction 25% or less, mild nonobstructive coronary disease, left ventricular end-diastolic pressure 33   PMH:   has a past medical history of Carpal tunnel syndrome of right wrist, Chronic combined systolic (congestive) and diastolic (congestive) heart failure ([place]), Diabetes ([place]), GERD (gastroesophageal reflux disease), HLD (hyperlipidemia), Hypertension, NICM (nonischemic cardiomyopathy) ([place]), Pleural effusion, left, and Pneumonia (02/19/2017).  PSH:    Past Surgical History:  Procedure Laterality Date   COLONOSCOPY WITH PROPOFOL N/A 01/21/2017   Procedure: COLONOSCOPY WITH PROPOFOL;  Surgeon: Jonathon Bellows, MD;  Location: [facility] ENDOSCOPY;  Service: Gastroenterology;  Laterality: N/A;   ESOPHAGOGASTRODUODENOSCOPY (EGD) WITH PROPOFOL N/A 01/21/2017   Procedure: ESOPHAGOGASTRODUODENOSCOPY (EGD) WITH PROPOFOL;  Surgeon: Jonathon Bellows, MD;  Location: [facility] ENDOSCOPY;  Service: Gastroenterology;  Laterality: N/A;   FLEXIBLE SIGMOIDOSCOPY N/A 11/04/2016   Procedure: FLEXIBLE SIGMOIDOSCOPY;  Surgeon: Wilford Corner, MD;  Location: [facility] ENDOSCOPY;  Service: Endoscopy;  Laterality: N/A;   HIP PINNING,CANNULATED Left 06/19/2021   Procedure: CANNULATED HIP PINNING;  Surgeon: Renee Harder, MD;  Location: [facility] ORS;  Service: Orthopedics;  Laterality: Left;    LEFT HEART CATH AND CORONARY ANGIOGRAPHY N/A 04/05/2017   Procedure: LEFT HEART CATH AND CORONARY ANGIOGRAPHY;  Surgeon: Wellington Hampshire, MD;  Location: [facility] CV LAB;  Service: Cardiovascular;  Laterality: N/A;   LITHOTRIPSY      Current Outpatient Medications on File Prior to Visit  Medication Sig Dispense Refill   albuterol (VENTOLIN HFA) 108 (90 Base) MCG/ACT inhaler 2 puff EVERY 4 HOURS (route: inhalation)     APIXABAN (ELIQUIS) VTE STARTER PACK ($RemoveBefor'10MG'LblynEpjFjCc$  AND $Re'5MG'vDb$ ) Take as directed on package: start with two-$RemoveBefore'5mg'LjKLZJJuwJsLP$  tablets twice daily for 7 days. On day 8, switch to one-$RemoveBefor'5mg'OPiNQMImoSOQ$  tablet twice daily. 1 each 0   Blood Glucose Monitoring Suppl (RELION CONFIRM GLUCOSE MONITOR) w/Device KIT Use to check blood sugar daily.  DX: E11.9 1 kit 0   carvedilol (COREG) 12.5 MG tablet Take 1.5 tablets (18.75 mg total) by mouth 2 (two) times daily. 270 tablet 3   cetirizine (ZYRTEC) 10 MG tablet Take 1 tablet (10 mg total) by mouth daily. 30 tablet 11   Crisaborole (EUCRISA) 2 % OINT Apply to aa's BID. 100 g 3   fluticasone (FLONASE) 50 MCG/ACT nasal spray Place 2 sprays into both nostrils daily. Use for 4-6 weeks then stop and use seasonally or as needed. 16 g 3   furosemide (LASIX) 20 MG tablet Take 1 tablet (20 mg total) by mouth daily. 90 tablet 1   glucose blood (CONTOUR TEST) test strip by Other route Two (2) times a day.     Hyoscyamine Sulfate SL 0.125 MG SUBL hyoscyamine 0.125 mg sublingual tablet     ibuprofen (ADVIL) 200 MG tablet 1 tablet 3 TIMES DAILY (route: oral)     insulin glargine (LANTUS) 100 UNIT/ML Solostar Pen Inject 12 Units into the skin at bedtime. 15 mL 3   Insulin Pen Needle 31G X 5 MM MISC Use as directed with Lantus 100 each 3   Loperamide HCl 1 MG/7.5ML LIQD Imodium A-D 1 mg/7.5 mL oral liquid     meloxicam (MOBIC) 15 MG tablet 1 tablet DAILY (route: oral)     Multiple Vitamin (MULTIVITAMIN WITH MINERALS) TABS tablet Take 1 tablet by mouth daily.     ondansetron (ZOFRAN-ODT) 4  MG disintegrating tablet 1 tablet EVERY 8 HOURS (route: oral)     rosuvastatin (CRESTOR) 20 MG tablet Take 1 tablet (20 mg total) by mouth daily. 90 tablet 3   sacubitril-valsartan (ENTRESTO) 49-51 MG Take 1 tablet by mouth 2 (two) times daily. 30 tablet 3   spironolactone (ALDACTONE) 25 MG tablet Take 1 tablet (25 mg total) by mouth daily. 90 tablet 2   tacrolimus (PROTOPIC) 0.1 % ointment Apply topically 2 (two) times daily. 100 g 2   triamcinolone cream (KENALOG) 0.1 % Apply to aa's BID x 2 weeks. Then decrease use to BID 5d/wk. 453.6 g 1   Exenatide ER (BYDUREON BCISE) 2 MG/0.85ML AUIJ Inject 2 mg into the skin once a week. (Patient not taking: Reported on 09/29/2021) 4 pen 1   traZODone (DESYREL) 50 MG tablet Take 0.5 tablets (25 mg total) by mouth at bedtime as needed for sleep. (Patient not taking: Reported on 09/29/2021)     No current facility-administered medications on file prior to visit.       Allergies:   Patient has no known allergies.  Social History:  The patient  reports that she has never smoked. She has never used smokeless tobacco. She reports that she does not drink alcohol and does not use drugs.   Family History:   family history includes CAD in her father; Heart disease in her father; Hypertension in her father; Lung cancer in her mother.    Review of Systems: Review of Systems  Constitutional: Negative.   Respiratory: Negative.    Cardiovascular: Negative.   Gastrointestinal: Negative.   Musculoskeletal: Negative.   Neurological: Negative.   Psychiatric/Behavioral: Negative.    All other systems reviewed and are negative.    PHYSICAL EXAM: VS:  BP (!) 152/100 (BP Location: Left Arm, Patient Position: Sitting, Cuff Size: Normal)   Pulse 88   Ht $R'4\' 11"'Lp$  (1.499 m)   Wt 121 lb 4 oz (55 kg)   SpO2 97%   BMI 24.49 kg/m  , BMI Body mass index is 24.49 kg/m. Constitutional:  oriented to person, place, and time. No distress.  Walking with a walker HENT:   Head: Grossly normal Eyes:  no discharge. No scleral icterus.  Neck: No JVD, no carotid bruits  Cardiovascular: Regular rate and rhythm, no murmurs appreciated 1+ pitting left lower extremity edema Pulmonary/Chest: Clear to auscultation bilaterally, no wheezes or rails Abdominal: Soft.  no distension.  no tenderness.  Musculoskeletal: Normal range of motion Neurological:  normal muscle tone. Coordination normal. No atrophy Skin: Skin warm and dry Psychiatric: normal affect, pleasant  Recent Labs: 06/18/2021: ALT 16 06/22/2021: BUN 17; Creatinine, Ser 1.00; Hemoglobin 10.7; Magnesium 1.8; Platelets 269; Potassium 4.1; Sodium 138    Lipid Panel Lab Results  Component Value Date   CHOL 248 (H) 03/28/2020   HDL 59 03/28/2020   LDLCALC 158 (H) 03/28/2020   TRIG 173 (H) 03/28/2020      Wt Readings from Last 3 Encounters:  01/03/22 121 lb 4 oz (55 kg)  09/05/21 113 lb (51.3 kg)  07/13/21 110 lb (49.9 kg)      ASSESSMENT AND PLAN:  Chronic systolic congestive heart failure ([place]) - Plan: EKG stable, Stressed importance of taking her medications, blood pressure elevated on today's visit Appears euvolemic, recommend she call us with blood pressure measurements Meds refilled, no changes made  Type 2 diabetes mellitus without complication, without long-term current use of insulin (HCC)  hemoglobin A1c 12 down to 6.5 Remarkable improvement, discussed with her  NICM (nonischemic cardiomyopathy) (HCC) EF 45 to 50% Stay on current occasions including entresto, Carvedilol, spironolactone, Lasix Has repeat BMP scheduled next week with primary care  Essential hypertension Blood pressure elevated, did not take medications this morning Recommend she call us with blood pressure measurements of elevated for additional medication titration  Left lower extremity DVT We have refilled her Eliquis, was scheduled to run out before she was able to see primary care.  Presumably DVT was  provoked following left leg surgery in February very   Recommended booster  Total encounter time more than 30 minutes  Greater than 50% was spent in counseling and coordination of care with the patient    Orders Placed This Encounter  Procedures   EKG 12-Lead     Signed, Esmond Plants, M.D., Ph.D. 01/03/2022  [place], [place]

## 2022-01-03 ENCOUNTER — Ambulatory Visit: Payer: Medicare Other | Attending: Cardiovascular Disease | Admitting: Cardiovascular Disease

## 2022-01-03 ENCOUNTER — Encounter: Payer: Self-pay | Admitting: Cardiovascular Disease

## 2022-01-03 ENCOUNTER — Ambulatory Visit: Payer: Medicare Other | Admitting: Internal Medicine

## 2022-01-03 VITALS — BP 152/100 | HR 88 | Ht 59.0 in | Wt 121.2 lb

## 2022-01-03 DIAGNOSIS — I10 Essential (primary) hypertension: Secondary | ICD-10-CM | POA: Diagnosis not present

## 2022-01-03 DIAGNOSIS — I428 Other cardiomyopathies: Secondary | ICD-10-CM

## 2022-01-03 DIAGNOSIS — E119 Type 2 diabetes mellitus without complications: Secondary | ICD-10-CM | POA: Diagnosis not present

## 2022-01-03 DIAGNOSIS — I825Y9 Chronic embolism and thrombosis of unspecified deep veins of unspecified proximal lower extremity: Secondary | ICD-10-CM

## 2022-01-03 DIAGNOSIS — E782 Mixed hyperlipidemia: Secondary | ICD-10-CM | POA: Diagnosis not present

## 2022-01-03 DIAGNOSIS — I5022 Chronic systolic (congestive) heart failure: Secondary | ICD-10-CM | POA: Diagnosis not present

## 2022-01-03 DIAGNOSIS — Z794 Long term (current) use of insulin: Secondary | ICD-10-CM

## 2022-01-03 MED ORDER — APIXABAN 5 MG PO TABS: 5.0000 mg | ORAL_TABLET | Freq: Two times a day (BID) | ORAL | 1 refills | Status: DC

## 2022-01-03 MED ORDER — SACUBITRIL-VALSARTAN 49-51 MG PO TABS: 1.0000 | ORAL_TABLET | Freq: Two times a day (BID) | ORAL | 5 refills | Status: DC

## 2022-01-03 MED ORDER — ROSUVASTATIN CALCIUM 20 MG PO TABS: 20.0000 mg | ORAL_TABLET | Freq: Every day | ORAL | 3 refills | Status: DC

## 2022-01-03 MED ORDER — CARVEDILOL 12.5 MG PO TABS: 18.7500 mg | ORAL_TABLET | Freq: Two times a day (BID) | ORAL | 3 refills | Status: DC

## 2022-01-03 NOTE — Patient Instructions (Signed)
Medication Instructions:  ?No changes ? ?If you need a refill on your cardiac medications before your next appointment, please call your pharmacy.  ? ?Lab work: ?No new labs needed ? ?Testing/Procedures: ?No new testing needed ? ?Follow-Up: ?At [facility], you and your health needs are our priority.  As part of our continuing mission to provide you with exceptional heart care, we have created designated Provider Care Teams.  These Care Teams include your primary Cardiologist (physician) and Advanced Practice Providers (APPs -  Physician Assistants and Nurse Practitioners) who all work together to provide you with the care you need, when you need it. ? ?You will need a follow up appointment in 6 months, APP ok ? ?Providers on your designated Care Team:   ?Christopher Berge, NP ?Ryan Dunn, PA-C ?Cadence Furth, PA-C ? ?COVID-19 Vaccine Information can be found at: [email or web address] For questions related to vaccine distribution or appointments, please email [email or web address] or call [phone].  ? ?

## 2022-01-12 ENCOUNTER — Ambulatory Visit: Payer: Medicare Other | Admitting: Internal Medicine

## 2022-01-12 DIAGNOSIS — D649 Anemia, unspecified: Secondary | ICD-10-CM | POA: Insufficient documentation

## 2022-01-12 NOTE — Progress Notes (Deleted)
Subjective:    Patient ID: Erin Hayes, female    DOB: 1965/09/18, 56 y.o.   MRN: [id number]  HPI  Patient presents to clinic today for her annual exam.  Flu: 05/2019 Tetanus: COVID: Moderna x2 Pneumovax: 09/2020 Shingrix: Pap smear: Mammogram: Bone density: Colon screening: 01/2017 Vision screening: Dentist:  Diet: Exercise:  Review of Systems     Past Medical History:  Diagnosis Date   Carpal tunnel syndrome of right wrist    Chronic combined systolic (congestive) and diastolic (congestive) heart failure ([place])    a. 03/2017 Echo: EF 20-25%, diff HK, Gr2 DD; b. 09/2017 Echo: EF 20-25%, diff HK, Gr2 DD.   Diabetes ([place])    GERD (gastroesophageal reflux disease)    HLD (hyperlipidemia)    Hypertension    NICM (nonischemic cardiomyopathy) ([place])    a. 03/2017 Echo: EF 20-25%, diff HK, Gr2 DD, mild to mod MR, nl RV fxn;  b. 03/2017 Cath: mild nonobs dzs, EF 25%; c. 09/2017 Echo: EF 20-25%, diff HK. Gr2 DD. Mild MR. Mildly reduced RV fxn.   Pleural effusion, left    a. 03/2017 s/p thoracentesis-->300 ml withdrawn--transudative.   Pneumonia 02/19/2017    Current Outpatient Medications  Medication Sig Dispense Refill   albuterol (VENTOLIN HFA) 108 (90 Base) MCG/ACT inhaler 2 puff EVERY 4 HOURS (route: inhalation)     apixaban (ELIQUIS) 5 MG TABS tablet Take 1 tablet (5 mg total) by mouth 2 (two) times daily. 60 tablet 1   APIXABAN (ELIQUIS) VTE STARTER PACK (10MG AND 5MG) Take as directed on package: start with two-106m tablets twice daily for 7 days. On day 8, switch to one-524mtablet twice daily. 1 each 0   Blood Glucose Monitoring Suppl (RELION CONFIRM GLUCOSE MONITOR) w/Device KIT Use to check blood sugar daily.  DX: E11.9 1 kit 0   carvedilol (COREG) 12.5 MG tablet Take 1.5 tablets (18.75 mg total) by mouth 2 (two) times daily. 270 tablet 3   cetirizine (ZYRTEC) 10 MG tablet Take 1 tablet (10 mg total) by mouth daily. 30 tablet 11   Crisaborole (EUCRISA) 2 % OINT Apply  to aa's BID. 100 g 3   Exenatide ER (BYDUREON BCISE) 2 MG/0.85ML AUIJ Inject 2 mg into the skin once a week. (Patient not taking: Reported on 09/29/2021) 4 pen 1   fluticasone (FLONASE) 50 MCG/ACT nasal spray Place 2 sprays into both nostrils daily. Use for 4-6 weeks then stop and use seasonally or as needed. 16 g 3   furosemide (LASIX) 20 MG tablet Take 1 tablet (20 mg total) by mouth daily. 90 tablet 1   glucose blood (CONTOUR TEST) test strip by Other route Two (2) times a day.     Hyoscyamine Sulfate SL 0.125 MG SUBL hyoscyamine 0.125 mg sublingual tablet     ibuprofen (ADVIL) 200 MG tablet 1 tablet 3 TIMES DAILY (route: oral)     insulin glargine (LANTUS) 100 UNIT/ML Solostar Pen Inject 12 Units into the skin at bedtime. 15 mL 3   Insulin Pen Needle 31G X 5 MM MISC Use as directed with Lantus 100 each 3   Loperamide HCl 1 MG/7.5ML LIQD Imodium A-D 1 mg/7.5 mL oral liquid     meloxicam (MOBIC) 15 MG tablet 1 tablet DAILY (route: oral)     Multiple Vitamin (MULTIVITAMIN WITH MINERALS) TABS tablet Take 1 tablet by mouth daily.     ondansetron (ZOFRAN-ODT) 4 MG disintegrating tablet 1 tablet EVERY 8 HOURS (route: oral)  rosuvastatin (CRESTOR) 20 MG tablet Take 1 tablet (20 mg total) by mouth daily. 90 tablet 3   sacubitril-valsartan (ENTRESTO) 49-51 MG Take 1 tablet by mouth 2 (two) times daily. 60 tablet 5   spironolactone (ALDACTONE) 25 MG tablet Take 1 tablet (25 mg total) by mouth daily. 90 tablet 2   tacrolimus (PROTOPIC) 0.1 % ointment Apply topically 2 (two) times daily. 100 g 2   traZODone (DESYREL) 50 MG tablet Take 0.5 tablets (25 mg total) by mouth at bedtime as needed for sleep. (Patient not taking: Reported on 09/29/2021)     triamcinolone cream (KENALOG) 0.1 % Apply to aa's BID x 2 weeks. Then decrease use to BID 5d/wk. 453.6 g 1   No current facility-administered medications for this visit.    No Known Allergies  Family History  Problem Relation Age of Onset   Lung cancer  Mother    Hypertension Father    CAD Father        a. MI age 24   Heart disease Father    COPD Neg Hx    Diabetes Mellitus II Neg Hx     Social History   Socioeconomic History   Marital status: Single    Spouse name: Not on file   Number of children: 3   Years of education: 12   Highest education level: 12th grade  Occupational History   Occupation: Materials engineer  Tobacco Use   Smoking status: Never   Smokeless tobacco: Never  Vaping Use   Vaping Use: Never used  Substance and Sexual Activity   Alcohol use: No   Drug use: No   Sexual activity: Never  Other Topics Concern   Not on file  Social History Narrative   Not on file   Social Determinants of Health   Financial Resource Strain: Medium Risk (04/26/2017)   Overall Financial Resource Strain (CARDIA)    Difficulty of Paying Living Expenses: Somewhat hard  Food Insecurity: Food Insecurity Present (06/06/2017)   Hunger Vital Sign    Worried About Charity fundraiser in the Last Year: Often true    Ran Out of Food in the Last Year: Often true  Transportation Needs: Unmet Transportation Needs (04/26/2017)   PRAPARE - Hydrologist (Medical): Yes    Lack of Transportation (Non-Medical): Yes  Physical Activity: Insufficiently Active (04/26/2017)   Exercise Vital Sign    Days of Exercise per Week: 1 day    Minutes of Exercise per Session: 10 min  Stress: No Stress Concern Present (04/26/2017)   [place] of Stress : Not at all  Social Connections: Unknown (04/26/2017)   Social Connection and Isolation Panel [NHANES]    Frequency of Communication with Friends and Family: More than three times a week    Frequency of Social Gatherings with Friends and Family: More than three times a week    Attends Religious Services: More than 4 times per year    Active Member of [employer] or Organizations: Not on file    Attends English as a second language teacher Meetings: Never    Marital Status: Not on file  Intimate Partner Violence: Not At Risk (04/26/2017)   Humiliation, Afraid, Rape, and Kick questionnaire    Fear of Current or Ex-Partner: No    Emotionally Abused: No    Physically Abused: No    Sexually Abused: No     Constitutional: Denies fever, malaise, fatigue,  headache or abrupt weight changes.  HEENT: Denies eye pain, eye redness, ear pain, ringing in the ears, wax buildup, runny nose, nasal congestion, bloody nose, or sore throat. Respiratory: Denies difficulty breathing, shortness of breath, cough or sputum production.   Cardiovascular: Denies chest pain, chest tightness, palpitations or swelling in the hands or feet.  Gastrointestinal: Denies abdominal pain, bloating, constipation, diarrhea or blood in the stool.  GU: Denies urgency, frequency, pain with urination, burning sensation, blood in urine, odor or discharge. Musculoskeletal: Denies decrease in range of motion, difficulty with gait, muscle pain or joint pain and swelling.  Skin: Denies redness, rashes, lesions or ulcercations.  Neurological: Denies dizziness, difficulty with memory, difficulty with speech or problems with balance and coordination.  Psych: Denies anxiety, depression, SI/HI.  No other specific complaints in a complete review of systems (except as listed in HPI above).  Objective:   Physical Exam   There were no vitals taken for this visit. Wt Readings from Last 3 Encounters:  01/03/22 121 lb 4 oz (55 kg)  09/05/21 113 lb (51.3 kg)  07/13/21 110 lb (49.9 kg)    General: Appears their stated age, well developed, well nourished in NAD. Skin: Warm, dry and intact. No rashes, lesions or ulcerations noted. HEENT: Head: normal shape and size; Eyes: sclera white, no icterus, conjunctiva pink, PERRLA and EOMs intact; Ears: Tm's gray and intact, normal light reflex; Nose: mucosa pink and moist, septum midline; Throat/Mouth: Teeth present,  mucosa pink and moist, no exudate, lesions or ulcerations noted.  Neck:  Neck supple, trachea midline. No masses, lumps or thyromegaly present.  Cardiovascular: Normal rate and rhythm. S1,S2 noted.  No murmur, rubs or gallops noted. No JVD or BLE edema. No carotid bruits noted. Pulmonary/Chest: Normal effort and positive vesicular breath sounds. No respiratory distress. No wheezes, rales or ronchi noted.  Abdomen: Soft and nontender. Normal bowel sounds. No distention or masses noted. Liver, spleen and kidneys non palpable. Musculoskeletal: Normal range of motion. No signs of joint swelling. No difficulty with gait.  Neurological: Alert and oriented. Cranial nerves II-XII grossly intact. Coordination normal.  Psychiatric: Mood and affect normal. Behavior is normal. Judgment and thought content normal.    BMET    Component Value Date/Time   NA 138 06/22/2021 0615   NA 142 06/19/2018 1905   NA 129 (L) 04/25/2013 2004   K 4.1 06/22/2021 0615   K 5.7 (H) 04/25/2013 2004   CL 107 06/22/2021 0615   CL 97 (L) 04/25/2013 2004   CO2 26 06/22/2021 0615   CO2 12 (L) 04/25/2013 2004   GLUCOSE 96 06/22/2021 0615   GLUCOSE 370 (H) 04/25/2013 2004   BUN 17 06/22/2021 0615   BUN 14 06/19/2018 1905   BUN 17 04/25/2013 2004   CREATININE 1.00 06/22/2021 0615   CREATININE 0.96 03/28/2020 1044   CALCIUM 8.3 (L) 06/22/2021 0615   CALCIUM 9.7 04/25/2013 2004   GFRNONAA >60 06/22/2021 0615   GFRNONAA 66 03/28/2020 1044   GFRAA 77 03/28/2020 1044    Lipid Panel     Component Value Date/Time   CHOL 248 (H) 03/28/2020 1044   CHOL 216 (H) 06/19/2018 1905   TRIG 173 (H) 03/28/2020 1044   HDL 59 03/28/2020 1044   HDL 53 06/19/2018 1905   CHOLHDL 4.2 03/28/2020 1044   LDLCALC 158 (H) 03/28/2020 1044    CBC    Component Value Date/Time   WBC 8.7 06/22/2021 0615   RBC 3.92 06/22/2021 0615   HGB  10.7 (L) 06/22/2021 0615   HGB 11.6 06/19/2018 1905   HCT 33.9 (L) 06/22/2021 0615   HCT 35.4  06/19/2018 1905   PLT 269 06/22/2021 0615   PLT 435 06/19/2018 1905   MCV 86.5 06/22/2021 0615   MCV 85 06/19/2018 1905   MCV 88 04/25/2013 2004   MCH 27.3 06/22/2021 0615   MCHC 31.6 06/22/2021 0615   RDW 13.9 06/22/2021 0615   RDW 12.4 06/19/2018 1905   RDW 12.3 04/25/2013 2004   LYMPHSABS 1.7 06/18/2021 1747   MONOABS 0.6 06/18/2021 1747   EOSABS 0.1 06/18/2021 1747   BASOSABS 0.1 06/18/2021 1747    Hgb A1C Lab Results  Component Value Date   HGBA1C 6.5 (H) 06/22/2021           Assessment & Plan:   Preventative Health Maintenance:  Flu shot today Tetanus UTD Encouraged her to get her COVID booster Pneumovax UTD Discussed Shingrix vaccine, she will check coverage with her insurance company and schedule a nurse visit if she would like to have this done Pap smear Mammogram and bone density ordered-she will call to schedule Colon screening UTD Encouraged her to consume a balanced diet and exercise regimen Advised her to see an eye doctor and dentist annually We will check CBC, c-Met, lipid, A1c, urine microalbumin and hep C today  RTC in 6 months, follow-up chronic conditions Webb Silversmith, NP

## 2022-01-19 ENCOUNTER — Ambulatory Visit (INDEPENDENT_AMBULATORY_CARE_PROVIDER_SITE_OTHER): Payer: Medicare Other | Admitting: Internal Medicine

## 2022-01-19 ENCOUNTER — Encounter: Payer: Self-pay | Admitting: Internal Medicine

## 2022-01-19 VITALS — BP 154/103 | HR 89 | Temp 96.8°F | Ht 59.0 in | Wt 120.0 lb

## 2022-01-19 DIAGNOSIS — Z780 Asymptomatic menopausal state: Secondary | ICD-10-CM

## 2022-01-19 DIAGNOSIS — E119 Type 2 diabetes mellitus without complications: Secondary | ICD-10-CM

## 2022-01-19 DIAGNOSIS — Z0001 Encounter for general adult medical examination with abnormal findings: Secondary | ICD-10-CM

## 2022-01-19 DIAGNOSIS — Z794 Long term (current) use of insulin: Secondary | ICD-10-CM

## 2022-01-19 DIAGNOSIS — Z1231 Encounter for screening mammogram for malignant neoplasm of breast: Secondary | ICD-10-CM

## 2022-01-19 DIAGNOSIS — R29898 Other symptoms and signs involving the musculoskeletal system: Secondary | ICD-10-CM

## 2022-01-19 DIAGNOSIS — Z1159 Encounter for screening for other viral diseases: Secondary | ICD-10-CM

## 2022-01-19 MED ORDER — INSULIN GLARGINE 100 UNIT/ML SOLOSTAR PEN: 12.0000 [IU] | PEN_INJECTOR | Freq: Every day | SUBCUTANEOUS | 1 refills | Status: DC

## 2022-01-19 NOTE — Progress Notes (Signed)
Subjective:    Patient ID: Erin Hayes, female    DOB: Feb 07, 1966, 56 y.o.   MRN: [id number]  HPI  Patient presents to clinic today for her annual exam.  Flu: 05/2019 Tetanus: > 10 years ago COVID: Moderna X2 Pneumovax: 09/2020 Shingrix: Never Pap smear: >5 years ago Mammogram: Bone density: Colon screening: 01/2017 Vision screening: annually Dentist: biannually  Diet: She does eat meat. She consumes fruits and veggies. She does eat some fried foods. She drinks mostly Dt. Coke, unsweet tea. Exercise: None  Review of Systems     Past Medical History:  Diagnosis Date   Carpal tunnel syndrome of right wrist    Chronic combined systolic (congestive) and diastolic (congestive) heart failure ([place])    a. 03/2017 Echo: EF 20-25%, diff HK, Gr2 DD; b. 09/2017 Echo: EF 20-25%, diff HK, Gr2 DD.   Diabetes ([place])    GERD (gastroesophageal reflux disease)    HLD (hyperlipidemia)    Hypertension    NICM (nonischemic cardiomyopathy) ([place])    a. 03/2017 Echo: EF 20-25%, diff HK, Gr2 DD, mild to mod MR, nl RV fxn;  b. 03/2017 Cath: mild nonobs dzs, EF 25%; c. 09/2017 Echo: EF 20-25%, diff HK. Gr2 DD. Mild MR. Mildly reduced RV fxn.   Pleural effusion, left    a. 03/2017 s/p thoracentesis-->300 ml withdrawn--transudative.   Pneumonia 02/19/2017    Current Outpatient Medications  Medication Sig Dispense Refill   albuterol (VENTOLIN HFA) 108 (90 Base) MCG/ACT inhaler 2 puff EVERY 4 HOURS (route: inhalation)     apixaban (ELIQUIS) 5 MG TABS tablet Take 1 tablet (5 mg total) by mouth 2 (two) times daily. 60 tablet 1   APIXABAN (ELIQUIS) VTE STARTER PACK ($RemoveBefor'10MG'KZgArOFkDelT$  AND $Re'5MG'zcH$ ) Take as directed on package: start with two-$RemoveBefore'5mg'gxRQwgfnZBxIZ$  tablets twice daily for 7 days. On day 8, switch to one-$RemoveBefor'5mg'vHYkPFgxNRIN$  tablet twice daily. 1 each 0   Blood Glucose Monitoring Suppl (RELION CONFIRM GLUCOSE MONITOR) w/Device KIT Use to check blood sugar daily.  DX: E11.9 1 kit 0   carvedilol (COREG) 12.5 MG tablet Take 1.5 tablets (18.75 mg  total) by mouth 2 (two) times daily. 270 tablet 3   cetirizine (ZYRTEC) 10 MG tablet Take 1 tablet (10 mg total) by mouth daily. 30 tablet 11   Crisaborole (EUCRISA) 2 % OINT Apply to aa's BID. 100 g 3   Exenatide ER (BYDUREON BCISE) 2 MG/0.85ML AUIJ Inject 2 mg into the skin once a week. (Patient not taking: Reported on 09/29/2021) 4 pen 1   fluticasone (FLONASE) 50 MCG/ACT nasal spray Place 2 sprays into both nostrils daily. Use for 4-6 weeks then stop and use seasonally or as needed. 16 g 3   furosemide (LASIX) 20 MG tablet Take 1 tablet (20 mg total) by mouth daily. 90 tablet 1   glucose blood (CONTOUR TEST) test strip by Other route Two (2) times a day.     Hyoscyamine Sulfate SL 0.125 MG SUBL hyoscyamine 0.125 mg sublingual tablet     ibuprofen (ADVIL) 200 MG tablet 1 tablet 3 TIMES DAILY (route: oral)     insulin glargine (LANTUS) 100 UNIT/ML Solostar Pen Inject 12 Units into the skin at bedtime. 15 mL 3   Insulin Pen Needle 31G X 5 MM MISC Use as directed with Lantus 100 each 3   Loperamide HCl 1 MG/7.5ML LIQD Imodium A-D 1 mg/7.5 mL oral liquid     meloxicam (MOBIC) 15 MG tablet 1 tablet DAILY (route: oral)     Multiple  Vitamin (MULTIVITAMIN WITH MINERALS) TABS tablet Take 1 tablet by mouth daily.     ondansetron (ZOFRAN-ODT) 4 MG disintegrating tablet 1 tablet EVERY 8 HOURS (route: oral)     rosuvastatin (CRESTOR) 20 MG tablet Take 1 tablet (20 mg total) by mouth daily. 90 tablet 3   sacubitril-valsartan (ENTRESTO) 49-51 MG Take 1 tablet by mouth 2 (two) times daily. 60 tablet 5   spironolactone (ALDACTONE) 25 MG tablet Take 1 tablet (25 mg total) by mouth daily. 90 tablet 2   tacrolimus (PROTOPIC) 0.1 % ointment Apply topically 2 (two) times daily. 100 g 2   traZODone (DESYREL) 50 MG tablet Take 0.5 tablets (25 mg total) by mouth at bedtime as needed for sleep. (Patient not taking: Reported on 09/29/2021)     triamcinolone cream (KENALOG) 0.1 % Apply to aa's BID x 2 weeks. Then decrease  use to BID 5d/wk. 453.6 g 1   No current facility-administered medications for this visit.    No Known Allergies  Family History  Problem Relation Age of Onset   Lung cancer Mother    Hypertension Father    CAD Father        a. MI age 88   Heart disease Father    COPD Neg Hx    Diabetes Mellitus II Neg Hx     Social History   Socioeconomic History   Marital status: Single    Spouse name: Not on file   Number of children: 3   Years of education: 12   Highest education level: 12th grade  Occupational History   Occupation: Materials engineer  Tobacco Use   Smoking status: Never   Smokeless tobacco: Never  Vaping Use   Vaping Use: Never used  Substance and Sexual Activity   Alcohol use: No   Drug use: No   Sexual activity: Never  Other Topics Concern   Not on file  Social History Narrative   Not on file   Social Determinants of Health   Financial Resource Strain: Medium Risk (04/26/2017)   Overall Financial Resource Strain (CARDIA)    Difficulty of Paying Living Expenses: Somewhat hard  Food Insecurity: Food Insecurity Present (06/06/2017)   Hunger Vital Sign    Worried About [place] in the Last Year: Often true    Ran Out of Food in the Last Year: Often true  Transportation Needs: Unmet Transportation Needs (04/26/2017)   PRAPARE - Hydrologist (Medical): Yes    Lack of Transportation (Non-Medical): Yes  Physical Activity: Insufficiently Active (04/26/2017)   Exercise Vital Sign    Days of Exercise per Week: 1 day    Minutes of Exercise per Session: 10 min  Stress: No Stress Concern Present (04/26/2017)   [place] of Stress : Not at all  Social Connections: Unknown (04/26/2017)   Social Connection and Isolation Panel [NHANES]    Frequency of Communication with Friends and Family: More than three times a week    Frequency of Social Gatherings with  Friends and Family: More than three times a week    Attends Religious Services: More than 4 times per year    Active Member of [employer] or Organizations: Not on file    Attends Archivist Meetings: Never    Marital Status: Not on file  Intimate Partner Violence: Not At Risk (04/26/2017)   Humiliation, Afraid, Rape, and Kick questionnaire  Fear of Current or Ex-Partner: No    Emotionally Abused: No    Physically Abused: No    Sexually Abused: No     Constitutional: Denies fever, malaise, fatigue, headache or abrupt weight changes.  HEENT: Denies eye pain, eye redness, ear pain, ringing in the ears, wax buildup, runny nose, nasal congestion, bloody nose, or sore throat. Respiratory: Denies difficulty breathing, shortness of breath, cough or sputum production.   Cardiovascular: Denies chest pain, chest tightness, palpitations or swelling in the hands or feet.  Gastrointestinal: Denies abdominal pain, bloating, constipation, diarrhea or blood in the stool.  GU: Denies urgency, frequency, pain with urination, burning sensation, blood in urine, odor or discharge. Musculoskeletal: Pt reports weakness of left leg. Denies decrease in range of motion, difficulty with gait, muscle pain or joint pain and swelling.  Skin: Denies redness, rashes, lesions or ulcercations.  Neurological: Denies dizziness, difficulty with memory, difficulty with speech or problems with balance and coordination.  Psych: Denies anxiety, depression, SI/HI.  No other specific complaints in a complete review of systems (except as listed in HPI above).  Objective:   Physical Exam   BP (!) 154/103 (BP Location: Left Arm, Patient Position: Sitting, Cuff Size: Small)   Pulse 89   Temp (!) 96.8 F (36 C) (Temporal)   Ht $R'4\' 11"'nY$  (1.499 m)   Wt 120 lb (54.4 kg) Comment: Per pt  SpO2 99%   BMI 24.24 kg/m   Wt Readings from Last 3 Encounters:  01/03/22 121 lb 4 oz (55 kg)  09/05/21 113 lb (51.3 kg)  07/13/21 110  lb (49.9 kg)    General: Appears her stated age, well developed, well nourished in NAD. Skin: Warm, dry and intact. No  ulcerations noted. HEENT: Head: normal shape and size; Eyes: sclera white, no icterus, conjunctiva pink, PERRLA and EOMs intact;  Neck:  Neck supple, trachea midline. No masses, lumps or thyromegaly present.  Cardiovascular: Normal rate and rhythm. S1,S2 noted.  No murmur, rubs or gallops noted. No JVD or BLE edema. No carotid bruits noted. Pulmonary/Chest: Normal effort and positive vesicular breath sounds. No respiratory distress. No wheezes, rales or ronchi noted.  Abdomen:  Normal bowel sounds.  Musculoskeletal: Strength 5/5 BUE, strength 4/5 LLE/5 out of 5 RLE.  In wheelchair today. Neurological: Alert and oriented. Cranial nerves II-XII grossly intact. Coordination normal.  Psychiatric: Mood and affect normal. Behavior is normal. Judgment and thought content normal.     BMET    Component Value Date/Time   NA 138 06/22/2021 0615   NA 142 06/19/2018 1905   NA 129 (L) 04/25/2013 2004   K 4.1 06/22/2021 0615   K 5.7 (H) 04/25/2013 2004   CL 107 06/22/2021 0615   CL 97 (L) 04/25/2013 2004   CO2 26 06/22/2021 0615   CO2 12 (L) 04/25/2013 2004   GLUCOSE 96 06/22/2021 0615   GLUCOSE 370 (H) 04/25/2013 2004   BUN 17 06/22/2021 0615   BUN 14 06/19/2018 1905   BUN 17 04/25/2013 2004   CREATININE 1.00 06/22/2021 0615   CREATININE 0.96 03/28/2020 1044   CALCIUM 8.3 (L) 06/22/2021 0615   CALCIUM 9.7 04/25/2013 2004   GFRNONAA >60 06/22/2021 0615   GFRNONAA 66 03/28/2020 1044   GFRAA 77 03/28/2020 1044    Lipid Panel     Component Value Date/Time   CHOL 248 (H) 03/28/2020 1044   CHOL 216 (H) 06/19/2018 1905   TRIG 173 (H) 03/28/2020 1044   HDL 59 03/28/2020 1044  HDL 53 06/19/2018 1905   CHOLHDL 4.2 03/28/2020 1044   LDLCALC 158 (H) 03/28/2020 1044    CBC    Component Value Date/Time   WBC 8.7 06/22/2021 0615   RBC 3.92 06/22/2021 0615   HGB 10.7  (L) 06/22/2021 0615   HGB 11.6 06/19/2018 1905   HCT 33.9 (L) 06/22/2021 0615   HCT 35.4 06/19/2018 1905   PLT 269 06/22/2021 0615   PLT 435 06/19/2018 1905   MCV 86.5 06/22/2021 0615   MCV 85 06/19/2018 1905   MCV 88 04/25/2013 2004   MCH 27.3 06/22/2021 0615   MCHC 31.6 06/22/2021 0615   RDW 13.9 06/22/2021 0615   RDW 12.4 06/19/2018 1905   RDW 12.3 04/25/2013 2004   LYMPHSABS 1.7 06/18/2021 1747   MONOABS 0.6 06/18/2021 1747   EOSABS 0.1 06/18/2021 1747   BASOSABS 0.1 06/18/2021 1747    Hgb A1C Lab Results  Component Value Date   HGBA1C 6.5 (H) 06/22/2021           Assessment & Plan:   Preventative Health Maintenance:  She declines flu shot today She declines tetanus for financial reasons, advised if she gets bit or cut to get this done Encouraged her to get her COVID booster Pneumovax UTD Discussed Shingrix vaccine, she will check coverage with her insurance company and get this done at the pharmacy if she would like She declines Pap smear today Mammogram and density has been ordered she just needs to call and schedule this Colon screening UTD Encouraged her to consume a balanced diet and exercise regimen Advised her to see an eye doctor and dentist annually We will check CBC, c-Met, lipid, A1c, urine microalbumin and hep C today  RTC in 6 months, follow-up chronic conditions Webb Silversmith, NP

## 2022-01-19 NOTE — Patient Instructions (Signed)

## 2022-01-20 LAB — LIPID PANEL
Cholesterol: 241 mg/dL — ABNORMAL HIGH (ref <200)
HDL: 65 mg/dL (ref >=50)
LDL Cholesterol (Calc): 150 mg/dL (calc) — ABNORMAL HIGH
Non-HDL Cholesterol (Calc): 176 mg/dL (calc) — ABNORMAL HIGH (ref <130)
Total CHOL/HDL Ratio: 3.7 (calc) (ref <5.0)
Triglycerides: 135 mg/dL (ref <150)

## 2022-01-20 LAB — HEMOGLOBIN A1C
Hgb A1c MFr Bld: 7.4 % of total Hgb — ABNORMAL HIGH (ref <5.7)
Mean Plasma Glucose: 166 mg/dL
eAG (mmol/L): 9.2 mmol/L

## 2022-01-20 LAB — MICROALBUMIN / CREATININE URINE RATIO
Creatinine, Urine: 219 mg/dL (ref 20–275)
Microalb Creat Ratio: 602 mcg/mg creat — ABNORMAL HIGH (ref <30)
Microalb, Ur: 131.8 mg/dL

## 2022-01-20 LAB — COMPLETE METABOLIC PANEL WITH GFR
AG Ratio: 1.4 (calc) (ref 1.0–2.5)
ALT: 12 U/L (ref 6–29)
AST: 13 U/L (ref 10–35)
Albumin: 3.8 g/dL (ref 3.6–5.1)
Alkaline phosphatase (APISO): 88 U/L (ref 37–153)
BUN: 13 mg/dL (ref 7–25)
CO2: 23 mmol/L (ref 20–32)
Calcium: 8.3 mg/dL — ABNORMAL LOW (ref 8.6–10.4)
Chloride: 106 mmol/L (ref 98–110)
Creat: 0.89 mg/dL (ref 0.50–1.03)
Globulin: 2.8 g/dL (calc) (ref 1.9–3.7)
Glucose, Bld: 140 mg/dL — ABNORMAL HIGH (ref 65–99)
Potassium: 3.4 mmol/L — ABNORMAL LOW (ref 3.5–5.3)
Sodium: 141 mmol/L (ref 135–146)
Total Bilirubin: 0.6 mg/dL (ref 0.2–1.2)
Total Protein: 6.6 g/dL (ref 6.1–8.1)
eGFR: 75 mL/min/{1.73_m2} (ref >=60)

## 2022-01-20 LAB — CBC
HCT: 38.9 % (ref 35.0–45.0)
Hemoglobin: 13 g/dL (ref 11.7–15.5)
MCH: 28.1 pg (ref 27.0–33.0)
MCHC: 33.4 g/dL (ref 32.0–36.0)
MCV: 84.2 fL (ref 80.0–100.0)
MPV: 10.6 fL (ref 7.5–12.5)
Platelets: 341 10*3/uL (ref 140–400)
RBC: 4.62 10*6/uL (ref 3.80–5.10)
RDW: 13.1 % (ref 11.0–15.0)
WBC: 8.3 10*3/uL (ref 3.8–10.8)

## 2022-01-29 ENCOUNTER — Telehealth: Payer: Self-pay | Admitting: *Deleted

## 2022-01-29 MED ORDER — SACUBITRIL-VALSARTAN 97-103 MG PO TABS: 1.0000 | ORAL_TABLET | Freq: Two times a day (BID) | ORAL | 3 refills | Status: DC

## 2022-01-29 NOTE — Telephone Encounter (Signed)
Spoke with the patient and reviewed her medications. She reports taking all medications as prescribed and on a consistent basis. Will increase Entresto to 97/103 mg BID.

## 2022-01-29 NOTE — Telephone Encounter (Signed)
-----   Message from Minna Merritts, MD sent at 01/27/2022 12:09 PM EDT ----- Blood pressure elevated with primary care Can we confirm she is on all of the medications listed at current dosing If she is taking her medications on a consistent basis and blood pressure remains elevated, we can increase the dose of Entresto up to 97/103 mg twice daily Thx TG ----- Message ----- From: [name], NP Sent: 01/19/2022  11:10 AM EDT To: Minna Merritts, MD  Blood pressure remains significantly elevated.  Would you be agreeable to increasing dose of Entresto to 97-103 mg daily?

## 2022-02-12 ENCOUNTER — Ambulatory Visit: Payer: Medicare Other | Admitting: Cardiovascular Disease

## 2022-03-15 ENCOUNTER — Other Ambulatory Visit: Payer: Medicare Other

## 2022-04-25 ENCOUNTER — Telehealth: Payer: Self-pay | Admitting: Internal Medicine

## 2022-04-25 ENCOUNTER — Other Ambulatory Visit: Payer: Self-pay

## 2022-04-25 ENCOUNTER — Emergency Department
Admission: EM | Admit: 2022-04-25 | Discharge: 2022-04-25 | Disposition: A | Discharge status: Home / Self Care | Payer: Medicare Other | Attending: Emergency Medicine | Admitting: Emergency Medicine

## 2022-04-25 ENCOUNTER — Ambulatory Visit: Payer: Self-pay

## 2022-04-25 ENCOUNTER — Emergency Department: Payer: Medicare Other

## 2022-04-25 DIAGNOSIS — M79672 Pain in left foot: Secondary | ICD-10-CM | POA: Diagnosis present

## 2022-04-25 DIAGNOSIS — E876 Hypokalemia: Secondary | ICD-10-CM | POA: Diagnosis not present

## 2022-04-25 DIAGNOSIS — I509 Heart failure, unspecified: Secondary | ICD-10-CM | POA: Insufficient documentation

## 2022-04-25 DIAGNOSIS — I825Y2 Chronic embolism and thrombosis of unspecified deep veins of left proximal lower extremity: Secondary | ICD-10-CM | POA: Insufficient documentation

## 2022-04-25 DIAGNOSIS — E119 Type 2 diabetes mellitus without complications: Secondary | ICD-10-CM | POA: Insufficient documentation

## 2022-04-25 DIAGNOSIS — M7989 Other specified soft tissue disorders: Secondary | ICD-10-CM | POA: Diagnosis not present

## 2022-04-25 DIAGNOSIS — I110 Hypertensive heart disease with heart failure: Secondary | ICD-10-CM | POA: Insufficient documentation

## 2022-04-25 DIAGNOSIS — M109 Gout, unspecified: Secondary | ICD-10-CM | POA: Diagnosis not present

## 2022-04-25 LAB — CBC WITH DIFFERENTIAL/PLATELET
Abs Immature Granulocytes: 0.03 10*3/uL (ref 0.00–0.07)
Basophils Absolute: 0.1 10*3/uL (ref 0.0–0.1)
Basophils Relative: 1 %
Eosinophils Absolute: 0.1 10*3/uL (ref 0.0–0.5)
Eosinophils Relative: 1 %
HCT: 40.9 % (ref 36.0–46.0)
Hemoglobin: 13.1 g/dL (ref 12.0–15.0)
Immature Granulocytes: 0 %
Lymphocytes Relative: 20 %
Lymphs Abs: 2 10*3/uL (ref 0.7–4.0)
MCH: 27.3 pg (ref 26.0–34.0)
MCHC: 32 g/dL (ref 30.0–36.0)
MCV: 85.2 fL (ref 80.0–100.0)
Monocytes Absolute: 0.5 10*3/uL (ref 0.1–1.0)
Monocytes Relative: 5 %
Neutro Abs: 7.3 10*3/uL (ref 1.7–7.7)
Neutrophils Relative %: 73 %
Platelets: 352 10*3/uL (ref 150–400)
RBC: 4.8 MIL/uL (ref 3.87–5.11)
RDW: 12.9 % (ref 11.5–15.5)
WBC: 9.9 10*3/uL (ref 4.0–10.5)
nRBC: 0 % (ref 0.0–0.2)

## 2022-04-25 LAB — COMPREHENSIVE METABOLIC PANEL
ALT: 15 U/L (ref 0–44)
AST: 22 U/L (ref 15–41)
Albumin: 3.9 g/dL (ref 3.5–5.0)
Alkaline Phosphatase: 86 U/L (ref 38–126)
Anion gap: 12 (ref 5–15)
BUN: 16 mg/dL (ref 6–20)
CO2: 23 mmol/L (ref 22–32)
Calcium: 8.3 mg/dL — ABNORMAL LOW (ref 8.9–10.3)
Chloride: 105 mmol/L (ref 98–111)
Creatinine, Ser: 0.85 mg/dL (ref 0.44–1.00)
GFR, Estimated: >60 mL/min (ref >60)
Glucose, Bld: 193 mg/dL — ABNORMAL HIGH (ref 70–99)
Potassium: 3.1 mmol/L — ABNORMAL LOW (ref 3.5–5.1)
Sodium: 140 mmol/L (ref 135–145)
Total Bilirubin: 0.9 mg/dL (ref 0.3–1.2)
Total Protein: 7.3 g/dL (ref 6.5–8.1)

## 2022-04-25 LAB — URIC ACID: Uric Acid, Serum: 4.1 mg/dL (ref 2.5–7.1)

## 2022-04-25 LAB — BRAIN NATRIURETIC PEPTIDE: B Natriuretic Peptide: 653.8 pg/mL — ABNORMAL HIGH (ref 0.0–100.0)

## 2022-04-25 MED ORDER — ONDANSETRON 4 MG PO TBDP
4.0000 mg | ORAL_TABLET | Freq: Once | ORAL | Status: AC
  Administered 2022-04-25: 4 mg via ORAL
  Filled 2022-04-25: qty 1

## 2022-04-25 MED ORDER — APIXABAN (ELIQUIS) VTE STARTER PACK (10MG AND 5MG): ORAL_TABLET | ORAL | 0 refills | Status: DC

## 2022-04-25 MED ORDER — OXYCODONE-ACETAMINOPHEN 5-325 MG PO TABS
1.0000 | ORAL_TABLET | Freq: Once | ORAL | Status: AC
  Administered 2022-04-25: 1 via ORAL
  Filled 2022-04-25: qty 1

## 2022-04-25 MED ORDER — CEPHALEXIN 500 MG PO CAPS: 500.0000 mg | ORAL_CAPSULE | Freq: Two times a day (BID) | ORAL | 0 refills | Status: AC

## 2022-04-25 MED ORDER — HYDROCODONE-ACETAMINOPHEN 5-325 MG PO TABS: 1.0000 | ORAL_TABLET | ORAL | 0 refills | Status: DC | PRN

## 2022-04-25 MED ORDER — IBUPROFEN 600 MG PO TABS
600.0000 mg | ORAL_TABLET | Freq: Once | ORAL | Status: AC
  Administered 2022-04-25: 600 mg via ORAL
  Filled 2022-04-25: qty 1

## 2022-04-25 MED ORDER — PREDNISONE 20 MG PO TABS: 40.0000 mg | ORAL_TABLET | Freq: Every day | ORAL | 0 refills | Status: AC

## 2022-04-25 NOTE — ED Provider Triage Note (Signed)
Emergency Medicine Provider Triage Evaluation Note  Erin Hayes , a 56 y.o. female  was evaluated in triage.  Pt complains of L foot pain and swelling. HX chf, no CP or shob. No HX gout. No trauma.  Review of Systems  Positive: L foot edema, pain Negative: CP, shob, fever  Physical Exam  There were no vitals taken for this visit. Gen:   Awake, no distress   Resp:  Normal effort  MSK:   Moves extremities without difficulty , L foot edema Other:    Medical Decision Making  Medically screening exam initiated at 4:05 PM.  Appropriate orders placed.  Erin Hayes was informed that the remainder of the evaluation will be completed by another provider, this initial triage assessment does not replace that evaluation, and the importance of remaining in the ED until their evaluation is complete.  Labs, [place], xray   Darletta Moll, [place] 04/25/22 1607

## 2022-04-25 NOTE — ED Triage Notes (Signed)
Pt to ED via POV for pain and swelling in her left foot. Pt states that she was diagnosed with a blood clot recently. Pt states that the pain and swelling is just in her foot. Pt denies any injury.

## 2022-04-25 NOTE — Telephone Encounter (Signed)
Pt called at Home # but it is daughter's #. Gave pt's # to call [phone], LVMTCB to speak with NT about sx.   Summary: left foot swelling   Pt has swelling of the left foot / she stated she has been seen for this before and schedule appt for tomorrow / please advise if needed

## 2022-04-25 NOTE — Discharge Instructions (Signed)
For your swelling:  Take the prednisone as prescribed  Take the hydrocodone for severe pain  You an apply topical voltaren gel as well which I have prescribed  DO NOT TAKE mobic, advil/ibuprofen, aspirin while on the blood thinner  It is very important to follow-up with your primary regarding your blood thinner

## 2022-04-25 NOTE — ED Provider Notes (Signed)
[facility] Provider Note    Event Date/Time   First MD Initiated Contact with Patient 04/25/22 1841     (approximate)   History   No chief complaint on file.   HPI  Erin Hayes is a 56 y.o. female here with foot pain.  The patient states that over the last several days she has had progressively worsening left foot pain.  She describes pain as an aching, throbbing sensation in her left toe along with pain with any kind of range of motion.  She has a history of DVT and has also not been taking anticoagulation because she says she ran out of it.  Denies any bony injuries.  She states the joint has felt intermittently warmth and localized primarily to the great toe.  No history of gout.  No recent medication or diet changes.  No other complaints.     Physical Exam   Triage Vital Signs: ED Triage Vitals [04/25/22 1606]  Enc Vitals Group     BP (!) 182/118     Pulse Rate 95     Resp 16     Temp 98.2 F (36.8 C)     Temp Source Oral     SpO2 94 %     Weight      Height      Head Circumference      Peak Flow      Pain Score 7     Pain Loc      Pain Edu?      Excl. in [place]?     Most recent vital signs: Vitals:   04/25/22 1606 04/25/22 2046  BP: (!) 182/118 (!) 196/104  Pulse: 95 92  Resp: 16 16  Temp: 98.2 F (36.8 C) 98.2 F (36.8 C)  SpO2: 94% 96%     General: Awake, no distress.  CV:  Good peripheral perfusion.  Regular rate and rhythm.  No murmurs. Resp:  Normal effort.  Lungs clear to auscultation bilaterally. Abd:  No distention.  No tenderness. Other:  In the left foot, there is significant tenderness and mild erythema with swelling over the first MTP joint.  There is some mild surrounding redness but this does not streak up the foot or leg.  No other bony tenderness.  She has some mild pitting edema bilaterally.   ED Results / Procedures / Treatments   Labs (all labs ordered are listed, but only abnormal results are  displayed) Labs Reviewed  COMPREHENSIVE METABOLIC PANEL - Abnormal; Notable for the following components:      Result Value   Potassium 3.1 (*)    Glucose, Bld 193 (*)    Calcium 8.3 (*)    All other components within normal limits  BRAIN NATRIURETIC PEPTIDE - Abnormal; Notable for the following components:   B Natriuretic Peptide 653.8 (*)    All other components within normal limits  CBC WITH DIFFERENTIAL/PLATELET  URIC ACID     EKG    RADIOLOGY DG foot left: Osteopenia, no acute findings Ultrasound left: Chronic thrombus with no occlusion and no acute abnormality   I also independently reviewed and agree with radiologist interpretations.   PROCEDURES:  Critical Care performed: No    MEDICATIONS ORDERED IN ED: Medications  ondansetron (ZOFRAN-ODT) disintegrating tablet 4 mg (4 mg Oral Given 04/25/22 1933)  oxyCODONE-acetaminophen (PERCOCET/ROXICET) 5-325 MG per tablet 1 tablet (1 tablet Oral Given 04/25/22 1933)  ibuprofen (ADVIL) tablet 600 mg (600 mg Oral Given 04/25/22 1933)  IMPRESSION / MDM / ASSESSMENT AND PLAN / ED COURSE  I reviewed the triage vital signs and the nursing notes.                              Differential diagnosis includes, but is not limited to, gout, inflammatory arthritis, sprain/strain, cellulitis, osteomyelitis, unlikely atypical DVT, peripheral edema from CHF  Patient's presentation is most consistent with acute presentation with potential threat to life or bodily function.  56 year old female with past medical history of hypertension, diabetes, CHF, here with left foot pain.  Clinically, suspect acute gouty arthritis with focal tenderness and involvement of the MTP joint.  Differential includes atypical cellulitis although the skin is not necessarily tender.  Plain films showed no acute bony abnormality.  Ultrasound shows a chronic thrombus and she is supposedly supposed to be on anticoagulants but has not had it.  Will refill  these.  Her screening lab work is very reassuring.  CMP with mild hypokalemia but normal renal function.  CBC with no leukocytosis or anemia.  BNP elevated at 653.  She does not appear to be in heart failure clinically.   Will treat with prednisone given her anticoagulant use, empiric abx in the event of early cellultiis, and d/c home. I refilled her anticoagulant and discussed the importance of adherence and PCP f/u.  FINAL CLINICAL IMPRESSION(S) / ED DIAGNOSES   Final diagnoses:  Acute gout involving toe of left foot, unspecified cause  Chronic deep vein thrombosis (DVT) of proximal vein of left lower extremity (HCC)     Rx / DC Orders   ED Discharge Orders          Ordered    APIXABAN (ELIQUIS) VTE STARTER PACK ('10MG'$  AND '5MG'$ )        04/25/22 2014    HYDROcodone-acetaminophen (NORCO/VICODIN) 5-325 MG tablet  Every 4 hours PRN        04/25/22 2014    predniSONE (DELTASONE) 20 MG tablet  Daily        04/25/22 2014    cephALEXin (KEFLEX) 500 MG capsule  2 times daily        04/25/22 2042             Note:  This document was prepared using Dragon voice recognition software and may include unintentional dictation errors.   Duffy Bruce, MD 04/25/22 2146

## 2022-04-25 NOTE — Telephone Encounter (Signed)
Pt advised.  She agreed to go to the ED.   Thanks,   -Mickel Baas

## 2022-04-25 NOTE — Telephone Encounter (Signed)
  Chief Complaint: left foot swelling Symptoms: left foot swelling , pain , discolored dark , limping to walk. Cool to touch. Hx blood clots in same leg.  Frequency: greater than 1 week  Pertinent Negatives: Patient denies na  Disposition: '[x]'$ ED /'[]'$ Urgent Care (no appt availability in office) / '[]'$ Appointment(In office/virtual)/ '[]'$  [facility]/ '[]'$ Home Care/ '[]'$ Refused Recommended Disposition /'[]'$ [facility]/ '[]'$  Follow-up with PCP Additional Notes:   Recommended ED due to sx and hx blood clot in same leg. Please advise if appt should be canceled for tomorrow.      Reason for Disposition  Entire foot is cool or blue in comparison to other foot  Answer Assessment - Initial Assessment Questions 1. ONSET: "When did the pain start?"      Greater than 1 week  2. LOCATION: "Where is the pain located?"      Foot left  3. PAIN: "How bad is the pain?"    (Scale 1-10; or mild, moderate, severe)  - MILD (1-3): doesn't interfere with normal activities.   - MODERATE (4-7): interferes with normal activities (e.g., work or school) or awakens from sleep, limping.   - SEVERE (8-10): excruciating pain, unable to do any normal activities, unable to walk.      Limping  4. WORK OR EXERCISE: "Has there been any recent work or exercise that involved this part of the body?"      Not sure did do a lot of walking more than usual.  5. CAUSE: "What do you think is causing the foot pain?"     Not sure possible walking on foot too much 6. OTHER SYMPTOMS: "Do you have any other symptoms?" (e.g., leg pain, rash, fever, numbness)     Foot discolored dark , cool to touch, swollen . Hx blood clot  7. PREGNANCY: "Is there any chance you are pregnant?" "When was your last menstrual period?"     na  Protocols used: Foot Pain-A-AH

## 2022-04-25 NOTE — Telephone Encounter (Signed)
Left message for patient to call back and schedule Medicare Annual Wellness Visit (AWV) to be done virtually or by telephone.  No hx of AWV eligible as of 08/05/21  Please schedule at anytime with [facility].      32 Minutes appointment   Any questions, please call me at [phone]

## 2022-04-25 NOTE — Telephone Encounter (Signed)
Agree with ED.  Okay to cancel appointment if she goes to the ED.

## 2022-04-25 NOTE — ED Notes (Signed)
Signature pad unable to connect at time of discharge; discharge signature not obtained due to this equipment malfunction.

## 2022-04-26 ENCOUNTER — Encounter: Payer: Self-pay | Admitting: Internal Medicine

## 2022-04-26 ENCOUNTER — Ambulatory Visit (INDEPENDENT_AMBULATORY_CARE_PROVIDER_SITE_OTHER): Payer: Medicare Other | Admitting: Internal Medicine

## 2022-04-26 VITALS — BP 134/86 | HR 88 | Temp 96.8°F | Wt 117.0 lb

## 2022-04-26 DIAGNOSIS — L03119 Cellulitis of unspecified part of limb: Secondary | ICD-10-CM

## 2022-04-26 DIAGNOSIS — I5022 Chronic systolic (congestive) heart failure: Secondary | ICD-10-CM

## 2022-04-26 DIAGNOSIS — I82562 Chronic embolism and thrombosis of left calf muscular vein: Secondary | ICD-10-CM

## 2022-04-26 DIAGNOSIS — M10272 Drug-induced gout, left ankle and foot: Secondary | ICD-10-CM

## 2022-04-26 MED ORDER — FUROSEMIDE 40 MG PO TABS: 40.0000 mg | ORAL_TABLET | Freq: Every day | ORAL | 0 refills | Status: DC

## 2022-04-26 NOTE — Patient Instructions (Signed)
Gout  Gout is a condition that causes painful swelling of the joints. Gout is a type of inflammation of the joints (arthritis). This condition is caused by having too much uric acid in the body. Uric acid is a chemical that forms when the body breaks down substances called purines. Purines are important for building body proteins. When the body has too much uric acid, sharp crystals can form and build up inside the joints. This causes pain and swelling. Gout attacks can happen quickly and may be very painful (acute gout). Over time, the attacks can affect more joints and become more frequent (chronic gout). Gout can also cause uric acid to build up under the skin and inside the kidneys. What are the causes? This condition is caused by too much uric acid in your blood. This can happen because: Your kidneys do not remove enough uric acid from your blood. This is the most common cause. Your body makes too much uric acid. This can happen with some cancers and cancer treatments. It can also occur if your body is breaking down too many red blood cells (hemolytic anemia). You eat too many foods that are high in purines. These foods include organ meats and some seafood. Alcohol, especially beer, is also high in purines. A gout attack may be triggered by trauma or stress. What increases the risk? The following factors may make you more likely to develop this condition: Having a family history of gout. Being female and middle-aged. Being female and having gone through menopause. Taking certain medicines, including aspirin, cyclosporine, diuretics, levodopa, and niacin. Having an organ transplant. Having certain conditions, such as: Being obese. Lead poisoning. Kidney disease. A skin condition called psoriasis. Other factors include: Losing weight too quickly. Being dehydrated. Frequently drinking alcohol, especially beer. Frequently drinking beverages that are sweetened with a type of sugar called  fructose. What are the signs or symptoms? An attack of acute gout happens quickly. It usually occurs in just one joint. The most common place is the big toe. Attacks often start at night. Other joints that may be affected include joints of the feet, ankle, knee, fingers, wrist, or elbow. Symptoms of this condition may include: Severe pain. Warmth. Swelling. Stiffness. Tenderness. The affected joint may be very painful to touch. Shiny, red, or purple skin. Chills and fever. Chronic gout may cause symptoms more frequently. More joints may be involved. You may also have white or yellow lumps (tophi) on your hands or feet or in other areas near your joints. How is this diagnosed? This condition is diagnosed based on your symptoms, your medical history, and a physical exam. You may have tests, such as: Blood tests to measure uric acid levels. Removal of joint fluid with a thin needle (aspiration) to look for uric acid crystals. X-rays to look for joint damage. How is this treated? Treatment for this condition has two phases: treating an acute attack and preventing future attacks. Acute gout treatment may include medicines to reduce pain and swelling, including: NSAIDs, such as ibuprofen. Steroids. These are strong anti-inflammatory medicines that can be taken by mouth (orally) or injected into a joint. Colchicine. This medicine relieves pain and swelling when it is taken soon after an attack. It can be given by mouth or through an IV. Preventive treatment may include: Daily use of smaller doses of NSAIDs or colchicine. Use of a medicine that reduces uric acid levels in your blood, such as allopurinol. Changes to your diet. You may need to see   a dietitian about what to eat and drink to prevent gout. Follow these instructions at home: During a gout attack  If directed, put ice on the affected area. To do this: Put ice in a plastic bag. Place a towel between your skin and the bag. Leave the  ice on for 20 minutes, 2-3 times a day. Remove the ice if your skin turns bright red. This is very important. If you cannot feel pain, heat, or cold, you have a greater risk of damage to the area. Raise (elevate) the affected joint above the level of your heart as often as possible. Rest the joint as much as possible. If the affected joint is in your leg, you may be given crutches to use. Follow instructions from your health care provider about eating or drinking restrictions. Avoiding future gout attacks Follow a low-purine diet as told by your dietitian or health care provider. Avoid foods and drinks that are high in purines, including liver, kidney, anchovies, asparagus, herring, mushrooms, mussels, and beer. Maintain a healthy weight or lose weight if you are overweight. If you want to lose weight, talk with your health care provider. Do not lose weight too quickly. Start or maintain an exercise program as told by your health care provider. Eating and drinking Avoid drinking beverages that contain fructose. Drink enough fluids to keep your urine pale yellow. If you drink alcohol: Limit how much you have to: 0-1 drink a day for women who are not pregnant. 0-2 drinks a day for men. Know how much alcohol is in a drink. In the [place] one drink equals one 12 oz bottle of beer (355 mL), one 5 oz glass of wine (148 mL), or one 1 oz glass of hard liquor (44 mL). General instructions Take over-the-counter and prescription medicines only as told by your health care provider. Ask your health care provider if the medicine prescribed to you requires you to avoid driving or using machinery. Return to your normal activities as told by your health care provider. Ask your health care provider what activities are safe for you. Keep all follow-up visits. This is important. Where to find more information National Institutes of Health: www.niams.nih.gov Contact a health care provider if you have: Another  gout attack. Continuing symptoms of a gout attack after 10 days of treatment. Side effects from your medicines. Chills or a fever. Burning pain when you urinate. Pain in your lower back or abdomen. Get help right away if you: Have severe or uncontrolled pain. Cannot urinate. Summary Gout is painful swelling of the joints caused by having too much uric acid in the body. The most common site for gout to occur is in the big toe, but it can affect other joints in the body. Medicines and dietary changes can help to prevent and treat gout attacks. This information is not intended to replace advice given to you by your health care provider. Make sure you discuss any questions you have with your health care provider. Document Revised: 01/25/2021 Document Reviewed: 01/25/2021 Elsevier Patient Education  2023 Elsevier Inc.  

## 2022-04-26 NOTE — Progress Notes (Signed)
Subjective:    Patient ID: Erin Hayes, female    DOB: [birth year], 56 y.o.   MRN: [id number]  HPI  Patient presents to clinic today for ER follow-up.  She presented to the ER yesterday with complaint of left foot pain and swelling.  Labs showed a potassium of 3.3 and a BNP of 653.8.  Ultrasound LLE showed chronic nonocclusive thrombus.  X-ray of the left foot showed osteopenia but no other acute findings.  She was given ibuprofen, oxycodone and Zofran in the ER.  She was diagnosed with acute gout of the left foot with possible cellulitis and given Rx for Prednisone, Keflex.  They also refilled her Eliquis.  She was discharged and advised to follow-up with her PCP.  Since that time she reports improvement in the pain and swelling in her left foot.  She has not had chance to pick up her medications at this time.  Review of Systems     Past Medical History:  Diagnosis Date   Carpal tunnel syndrome of right wrist    Chronic combined systolic (congestive) and diastolic (congestive) heart failure ([place])    a. 03/2017 Echo: EF 20-25%, diff HK, Gr2 DD; b. 09/2017 Echo: EF 20-25%, diff HK, Gr2 DD.   Diabetes ([place])    GERD (gastroesophageal reflux disease)    HLD (hyperlipidemia)    Hypertension    NICM (nonischemic cardiomyopathy) ([place])    a. 03/2017 Echo: EF 20-25%, diff HK, Gr2 DD, mild to mod MR, nl RV fxn;  b. 03/2017 Cath: mild nonobs dzs, EF 25%; c. 09/2017 Echo: EF 20-25%, diff HK. Gr2 DD. Mild MR. Mildly reduced RV fxn.   Pleural effusion, left    a. 03/2017 s/p thoracentesis-->300 ml withdrawn--transudative.   Pneumonia 02/19/2017    Current Outpatient Medications  Medication Sig Dispense Refill   albuterol (VENTOLIN HFA) 108 (90 Base) MCG/ACT inhaler 2 puff EVERY 4 HOURS (route: inhalation)     APIXABAN (ELIQUIS) VTE STARTER PACK (10MG AND 5MG) Take as directed on package: start with two-34m tablets twice daily for 7 days. On day 8, switch to one-572mtablet twice daily. 1 each 0    Blood Glucose Monitoring Suppl (RELION CONFIRM GLUCOSE MONITOR) w/Device KIT Use to check blood sugar daily.  DX: E11.9 1 kit 0   carvedilol (COREG) 12.5 MG tablet Take 1.5 tablets (18.75 mg total) by mouth 2 (two) times daily. 270 tablet 3   cephALEXin (KEFLEX) 500 MG capsule Take 1 capsule (500 mg total) by mouth 2 (two) times daily for 7 days. 14 capsule 0   cetirizine (ZYRTEC) 10 MG tablet Take 1 tablet (10 mg total) by mouth daily. 30 tablet 11   Crisaborole (EUCRISA) 2 % OINT Apply to aa's BID. 100 g 3   Exenatide ER (BYDUREON BCISE) 2 MG/0.85ML AUIJ Inject 2 mg into the skin once a week. 4 pen 1   fluticasone (FLONASE) 50 MCG/ACT nasal spray Place 2 sprays into both nostrils daily. Use for 4-6 weeks then stop and use seasonally or as needed. 16 g 3   furosemide (LASIX) 20 MG tablet Take 1 tablet (20 mg total) by mouth daily. 90 tablet 1   glucose blood (CONTOUR TEST) test strip by Other route Two (2) times a day.     HYDROcodone-acetaminophen (NORCO/VICODIN) 5-325 MG tablet Take 1 tablet by mouth every 4 (four) hours as needed for severe pain. 12 tablet 0   Hyoscyamine Sulfate SL 0.125 MG SUBL hyoscyamine 0.125 mg sublingual tablet  insulin glargine (LANTUS) 100 UNIT/ML Solostar Pen Inject 12 Units into the skin at bedtime. 15 mL 1   Insulin Pen Needle 31G X 5 MM MISC Use as directed with Lantus 100 each 3   Loperamide HCl 1 MG/7.5ML LIQD Imodium A-D 1 mg/7.5 mL oral liquid     Multiple Vitamin (MULTIVITAMIN WITH MINERALS) TABS tablet Take 1 tablet by mouth daily.     ondansetron (ZOFRAN-ODT) 4 MG disintegrating tablet 1 tablet EVERY 8 HOURS (route: oral)     predniSONE (DELTASONE) 20 MG tablet Take 2 tablets (40 mg total) by mouth daily for 5 days. 10 tablet 0   rosuvastatin (CRESTOR) 20 MG tablet Take 1 tablet (20 mg total) by mouth daily. 90 tablet 3   sacubitril-valsartan (ENTRESTO) 97-103 MG Take 1 tablet by mouth 2 (two) times daily. 180 tablet 3   spironolactone (ALDACTONE) 25 MG  tablet Take 1 tablet (25 mg total) by mouth daily. 90 tablet 2   tacrolimus (PROTOPIC) 0.1 % ointment Apply topically 2 (two) times daily. 100 g 2   traZODone (DESYREL) 50 MG tablet Take 0.5 tablets (25 mg total) by mouth at bedtime as needed for sleep.     triamcinolone cream (KENALOG) 0.1 % Apply to aa's BID x 2 weeks. Then decrease use to BID 5d/wk. 453.6 g 1   No current facility-administered medications for this visit.    No Known Allergies  Family History  Problem Relation Age of Onset   Lung cancer Mother    Hypertension Father    CAD Father        a. MI age 46   Heart disease Father    COPD Neg Hx    Diabetes Mellitus II Neg Hx     Social History   Socioeconomic History   Marital status: Single    Spouse name: Not on file   Number of children: 3   Years of education: 12   Highest education level: 12th grade  Occupational History   Occupation: Materials engineer  Tobacco Use   Smoking status: Never   Smokeless tobacco: Never  Vaping Use   Vaping Use: Never used  Substance and Sexual Activity   Alcohol use: No   Drug use: No   Sexual activity: Never  Other Topics Concern   Not on file  Social History Narrative   Not on file   Social Determinants of Health   Financial Resource Strain: Medium Risk (04/26/2017)   Overall Financial Resource Strain (CARDIA)    Difficulty of Paying Living Expenses: Somewhat hard  Food Insecurity: Food Insecurity Present (06/06/2017)   Hunger Vital Sign    Worried About [place] in the Last Year: Often true    Ran Out of Food in the Last Year: Often true  Transportation Needs: Unmet Transportation Needs (04/26/2017)   PRAPARE - Hydrologist (Medical): Yes    Lack of Transportation (Non-Medical): Yes  Physical Activity: Insufficiently Active (04/26/2017)   Exercise Vital Sign    Days of Exercise per Week: 1 day    Minutes of Exercise per Session: 10 min  Stress: No Stress Concern Present  (04/26/2017)   [place] of Stress : Not at all  Social Connections: Unknown (04/26/2017)   Social Connection and Isolation Panel [NHANES]    Frequency of Communication with Friends and Family: More than three times a week    Frequency of Social  Gatherings with Friends and Family: More than three times a week    Attends Religious Services: More than 4 times per year    Active Member of [employer] or Organizations: Not on file    Attends Archivist Meetings: Never    Marital Status: Not on file  Intimate Partner Violence: Not At Risk (04/26/2017)   Humiliation, Afraid, Rape, and Kick questionnaire    Fear of Current or Ex-Partner: No    Emotionally Abused: No    Physically Abused: No    Sexually Abused: No     Constitutional: Denies fever, malaise, fatigue, headache or abrupt weight changes.  HEENT: Denies eye pain, eye redness, ear pain, ringing in the ears, wax buildup, runny nose, nasal congestion, bloody nose, or sore throat. Respiratory: Denies difficulty breathing, shortness of breath, cough or sputum production.   Cardiovascular: Patient reports swelling of lower extremities.  Denies chest pain, chest tightness, palpitations or swelling in the hands or feet.  Gastrointestinal: Denies abdominal pain, bloating, constipation, diarrhea or blood in the stool.  Musculoskeletal: Patient reports left foot pain and swelling.  Denies decrease in range of motion, difficulty with gait, muscle pain.  Skin: Denies redness, rashes, lesions or ulcercations.  Neurological: Denies dizziness, difficulty with memory, difficulty with speech or problems with balance and coordination.    No other specific complaints in a complete review of systems (except as listed in HPI above).  Objective:   Physical Exam  BP 134/86 (BP Location: Right Arm, Patient Position: Sitting, Cuff Size: Normal)   Pulse 88   Temp (!) 96.8  F (36 C) (Temporal)   Wt 117 lb (53.1 kg)   SpO2 97%   BMI 23.63 kg/m   Wt Readings from Last 3 Encounters:  01/19/22 120 lb (54.4 kg)  01/03/22 121 lb 4 oz (55 kg)  09/05/21 113 lb (51.3 kg)    General: Appears her stated age, chronically ill appearing, in NAD. Skin: Warm, dry and intact. No ulcerations noted.  No redness or warmth noted of the foot. Cardiovascular: Normal rate and rhythm. S1,S2 noted.  Murmur noted. No JVD.  1+ pitting BLE edema.  Pulmonary/Chest: Normal effort and positive vesicular breath sounds. No respiratory distress. No wheezes, rales or ronchi noted.  Musculoskeletal: Pain with palpation over the left MTP.  Gait not visualized that she is in a wheelchair. Neurological: Alert and oriented.    BMET    Component Value Date/Time   NA 140 04/25/2022 1608   NA 142 06/19/2018 1905   NA 129 (L) 04/25/2013 2004   K 3.1 (L) 04/25/2022 1608   K 5.7 (H) 04/25/2013 2004   CL 105 04/25/2022 1608   CL 97 (L) 04/25/2013 2004   CO2 23 04/25/2022 1608   CO2 12 (L) 04/25/2013 2004   GLUCOSE 193 (H) 04/25/2022 1608   GLUCOSE 370 (H) 04/25/2013 2004   BUN 16 04/25/2022 1608   BUN 14 06/19/2018 1905   BUN 17 04/25/2013 2004   CREATININE 0.85 04/25/2022 1608   CREATININE 0.89 01/19/2022 1105   CALCIUM 8.3 (L) 04/25/2022 1608   CALCIUM 9.7 04/25/2013 2004   GFRNONAA >60 04/25/2022 1608   GFRNONAA 66 03/28/2020 1044   GFRAA 77 03/28/2020 1044    Lipid Panel     Component Value Date/Time   CHOL 241 (H) 01/19/2022 1105   CHOL 216 (H) 06/19/2018 1905   TRIG 135 01/19/2022 1105   HDL 65 01/19/2022 1105   HDL 53 06/19/2018 1905   CHOLHDL  3.7 01/19/2022 1105   LDLCALC 150 (H) 01/19/2022 1105    CBC    Component Value Date/Time   WBC 9.9 04/25/2022 1608   RBC 4.80 04/25/2022 1608   HGB 13.1 04/25/2022 1608   HGB 11.6 06/19/2018 1905   HCT 40.9 04/25/2022 1608   HCT 35.4 06/19/2018 1905   PLT 352 04/25/2022 1608   PLT 435 06/19/2018 1905   MCV 85.2  04/25/2022 1608   MCV 85 06/19/2018 1905   MCV 88 04/25/2013 2004   MCH 27.3 04/25/2022 1608   MCHC 32.0 04/25/2022 1608   RDW 12.9 04/25/2022 1608   RDW 12.4 06/19/2018 1905   RDW 12.3 04/25/2013 2004   LYMPHSABS 2.0 04/25/2022 1608   MONOABS 0.5 04/25/2022 1608   EOSABS 0.1 04/25/2022 1608   BASOSABS 0.1 04/25/2022 1608    Hgb A1C Lab Results  Component Value Date   HGBA1C 7.4 (H) 01/19/2022           Assessment & Plan:   ER Follow Up for Acute Gout, Left Foot, Cellulitis, CHF, Chronic DVT LLE:  ER notes, labs and imaging reviewed Advised her to continue Prednisone and Eliquis as prescribed No evidence of cellulitis on exam, advised her not to pick up the Keflex Encouraged elevation and compression stockings Reinforced DASH diet Encouraged her to monitor daily weights Will increase furosemide to 40 mg daily  RTC in 3 months for follow-up of chronic conditions Webb Silversmith, NP

## 2022-06-26 ENCOUNTER — Encounter: Payer: Self-pay | Admitting: Emergency Medicine

## 2022-06-26 ENCOUNTER — Emergency Department: Payer: 59

## 2022-06-26 ENCOUNTER — Other Ambulatory Visit: Payer: Self-pay

## 2022-06-26 ENCOUNTER — Emergency Department
Admission: EM | Admit: 2022-06-26 | Discharge: 2022-06-26 | Disposition: A | Discharge status: Home / Self Care | Payer: 59 | Attending: Emergency Medicine | Admitting: Emergency Medicine

## 2022-06-26 DIAGNOSIS — R0789 Other chest pain: Secondary | ICD-10-CM | POA: Diagnosis not present

## 2022-06-26 DIAGNOSIS — R0902 Hypoxemia: Secondary | ICD-10-CM | POA: Diagnosis not present

## 2022-06-26 DIAGNOSIS — I110 Hypertensive heart disease with heart failure: Secondary | ICD-10-CM | POA: Diagnosis not present

## 2022-06-26 DIAGNOSIS — R079 Chest pain, unspecified: Secondary | ICD-10-CM | POA: Diagnosis not present

## 2022-06-26 DIAGNOSIS — M79672 Pain in left foot: Secondary | ICD-10-CM | POA: Diagnosis not present

## 2022-06-26 DIAGNOSIS — I10 Essential (primary) hypertension: Secondary | ICD-10-CM | POA: Diagnosis not present

## 2022-06-26 DIAGNOSIS — S92345A Nondisplaced fracture of fourth metatarsal bone, left foot, initial encounter for closed fracture: Secondary | ICD-10-CM | POA: Insufficient documentation

## 2022-06-26 DIAGNOSIS — E119 Type 2 diabetes mellitus without complications: Secondary | ICD-10-CM | POA: Diagnosis not present

## 2022-06-26 DIAGNOSIS — I509 Heart failure, unspecified: Secondary | ICD-10-CM | POA: Insufficient documentation

## 2022-06-26 DIAGNOSIS — Y92410 Unspecified street and highway as the place of occurrence of the external cause: Secondary | ICD-10-CM | POA: Diagnosis not present

## 2022-06-26 DIAGNOSIS — M7989 Other specified soft tissue disorders: Secondary | ICD-10-CM | POA: Diagnosis not present

## 2022-06-26 MED ORDER — HYDROCODONE-ACETAMINOPHEN 5-325 MG PO TABS: 1.0000 | ORAL_TABLET | ORAL | 0 refills | Status: DC | PRN

## 2022-06-26 MED ORDER — MELOXICAM 7.5 MG PO TABS
15.0000 mg | ORAL_TABLET | Freq: Once | ORAL | Status: AC
  Administered 2022-06-26: 15 mg via ORAL
  Filled 2022-06-26: qty 2

## 2022-06-26 NOTE — Discharge Instructions (Addendum)
Please use ibuprofen (Motrin) up to 800 mg every 8 hours, naproxen (Naprosyn) up to 500 mg every 12 hours, and/or acetaminophen (Tylenol) up to 4 g/day for any continued pain.  Please do not use this medication regimen for longer than 7 days
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
## 2022-07-02 ENCOUNTER — Ambulatory Visit: Payer: Self-pay

## 2022-07-02 ENCOUNTER — Telehealth: Payer: Self-pay

## 2022-07-02 NOTE — Telephone Encounter (Signed)
     Patient  visit on [place]   at 2/20   Have you been able to follow up with your primary care physician? Yes 3/13  The patient was or was not able to obtain any needed medicine or equipment. yes  Are there diet recommendations that you are having difficulty following? Na   Patient expresses understanding of discharge instructions and education provided has no other needs at this time.  Yes      [facility] [phone] [address], [place], [place] [address] Phone: [phone] Email: [email or web address]
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
## 2022-07-03 ENCOUNTER — Ambulatory Visit (INDEPENDENT_AMBULATORY_CARE_PROVIDER_SITE_OTHER): Payer: 59 | Admitting: Internal Medicine

## 2022-07-03 ENCOUNTER — Encounter: Payer: Self-pay | Admitting: Internal Medicine

## 2022-07-03 DIAGNOSIS — R2681 Unsteadiness on feet: Secondary | ICD-10-CM

## 2022-07-03 DIAGNOSIS — R0789 Other chest pain: Secondary | ICD-10-CM | POA: Diagnosis not present

## 2022-07-03 DIAGNOSIS — R269 Unspecified abnormalities of gait and mobility: Secondary | ICD-10-CM | POA: Diagnosis not present

## 2022-07-03 DIAGNOSIS — S92345D Nondisplaced fracture of fourth metatarsal bone, left foot, subsequent encounter for fracture with routine healing: Secondary | ICD-10-CM

## 2022-07-03 MED ORDER — METHOCARBAMOL 500 MG PO TABS
500.0000 mg | ORAL_TABLET | Freq: Three times a day (TID) | ORAL | 0 refills | Status: DC | PRN
Start: 2022-07-03 — End: 2023-01-31
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
## 2022-07-03 NOTE — Patient Instructions (Signed)
   Chest Wall Pain Chest wall pain is pain in or around the bones and muscles of your chest. Chest wall pain may be caused by: An injury. Coughing a lot. Using your chest and arm muscles too much. Sometimes, the cause may not be known. This pain may take a few weeks or longer to get better. Follow these instructions at home: Managing pain, stiffness, and swelling If told, put ice on the painful area: Put ice in a plastic bag. Place a towel between your skin and the bag. Leave the ice on for 20 minutes, 2-3 times a day.  Activity Rest as told by your doctor. Avoid doing things that cause pain. This includes lifting heavy items. Ask your doctor what activities are safe for you. General instructions  Take over-the-counter and prescription medicines only as told by your doctor. Do not use any products that contain nicotine or tobacco, such as cigarettes, e-cigarettes, and chewing tobacco. If you need help quitting, ask your doctor. Keep all follow-up visits as told by your doctor. This is important. Contact a doctor if: You have a fever. Your chest pain gets worse. You have new symptoms. Get help right away if: You feel sick to your stomach (nauseous) or you throw up (vomit). You feel sweaty or light-headed. You have a cough with mucus from your lungs (sputum) or you cough up blood. You are short of breath. These symptoms may be an emergency. Do not wait to see if the symptoms will go away. Get medical help right away. Call your local emergency services (911 in the [place] Do not drive yourself to the hospital. Summary Chest wall pain is pain in or around the bones and muscles of your chest. It may be treated with ice, rest, and medicines. Your condition may also get better if you avoid doing things that cause pain. Contact a doctor if you have a fever, chest pain that gets worse, or new symptoms. Get help right away if you feel light-headed or you get short of breath. These symptoms  may be an emergency. This information is not intended to replace advice given to you by your health care provider. Make sure you discuss any questions you have with your health care provider. Document Revised: 07/26/2020 Document Reviewed: 07/08/2020 Elsevier Patient Education  2023 [employer]  
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
## 2022-07-10 ENCOUNTER — Ambulatory Visit: Payer: 59 | Attending: Cardiovascular Disease | Admitting: Cardiovascular Disease

## 2022-07-10 ENCOUNTER — Encounter: Payer: Self-pay | Admitting: Cardiovascular Disease

## 2022-07-10 VITALS — BP 160/90 | HR 90 | Ht 59.0 in | Wt 119.0 lb

## 2022-07-10 DIAGNOSIS — I428 Other cardiomyopathies: Secondary | ICD-10-CM

## 2022-07-10 DIAGNOSIS — Z794 Long term (current) use of insulin: Secondary | ICD-10-CM | POA: Diagnosis not present

## 2022-07-10 DIAGNOSIS — E119 Type 2 diabetes mellitus without complications: Secondary | ICD-10-CM | POA: Diagnosis not present

## 2022-07-10 DIAGNOSIS — I5022 Chronic systolic (congestive) heart failure: Secondary | ICD-10-CM

## 2022-07-10 DIAGNOSIS — E782 Mixed hyperlipidemia: Secondary | ICD-10-CM

## 2022-07-10 DIAGNOSIS — I10 Essential (primary) hypertension: Secondary | ICD-10-CM | POA: Diagnosis not present

## 2022-07-10 DIAGNOSIS — I825Y9 Chronic embolism and thrombosis of unspecified deep veins of unspecified proximal lower extremity: Secondary | ICD-10-CM

## 2022-07-10 MED ORDER — CARVEDILOL 12.5 MG PO TABS: 18.7500 mg | ORAL_TABLET | Freq: Two times a day (BID) | ORAL | 3 refills | Status: DC

## 2022-07-10 MED ORDER — SACUBITRIL-VALSARTAN 97-103 MG PO TABS: 1.0000 | ORAL_TABLET | Freq: Two times a day (BID) | ORAL | 3 refills | Status: DC

## 2022-07-10 MED ORDER — ROSUVASTATIN CALCIUM 20 MG PO TABS: 20.0000 mg | ORAL_TABLET | Freq: Every day | ORAL | 3 refills | Status: DC

## 2022-07-10 MED ORDER — POTASSIUM CHLORIDE CRYS ER 20 MEQ PO TBCR: 20.0000 meq | EXTENDED_RELEASE_TABLET | ORAL | 3 refills | Status: DC

## 2022-07-10 MED ORDER — APIXABAN 2.5 MG PO TABS: 2.5000 mg | ORAL_TABLET | Freq: Two times a day (BID) | ORAL | 3 refills | Status: DC

## 2022-07-10 MED ORDER — SPIRONOLACTONE 25 MG PO TABS: 25.0000 mg | ORAL_TABLET | Freq: Every day | ORAL | 3 refills | Status: DC

## 2022-07-10 MED ORDER — FUROSEMIDE 40 MG PO TABS: 40.0000 mg | ORAL_TABLET | ORAL | 0 refills | Status: DC

## 2022-07-10 NOTE — Patient Instructions (Addendum)
Medication Instructions:  Lasix 40 daily with potassium 20 meq Take extra lasix after lunch with potassium sparingly for leg swelling   When you are not moving well from injury, Take eliquis 2.5 mg twice a day for DVT/blood clot prevention  If you need a refill on your cardiac medications before your next appointment, please call your pharmacy.   Lab work: No new labs needed  Testing/Procedures: No new testing needed  Follow-Up: At [facility], you and your health needs are our priority.  As part of our continuing mission to provide you with exceptional heart care, we have created designated Provider Care Teams.  These Care Teams include your primary Cardiologist (physician) and Advanced Practice Providers (APPs -  Physician Assistants and Nurse Practitioners) who all work together to provide you with the care you need, when you need it.  You will need a follow up appointment in 6 months  Providers on your designated Care Team:   Murray Hodgkins, NP Christell Faith, PA-C Cadence Kathlen Mody, [place]  COVID-19 Vaccine Information can be found at: [email or web address] For questions related to vaccine distribution or appointments, please email [email or web address] or call [phone].

## 2022-07-11 ENCOUNTER — Telehealth: Payer: Self-pay | Admitting: Internal Medicine

## 2022-07-11 NOTE — Telephone Encounter (Signed)
Called patient to schedule Medicare Annual Wellness Visit (AWV). Left message for patient to call back and schedule Medicare Annual Wellness Visit (AWV).  Last date of AWV: none  Please schedule an appointment at any time with Kirke Shaggy, NHA  .  If any questions, please contact me.  Thank you ,  Sherol Dade; [place] Direct Dial: [phone]

## 2022-08-02 ENCOUNTER — Ambulatory Visit: Payer: 59 | Admitting: Internal Medicine

## 2022-08-02 NOTE — Progress Notes (Deleted)
Subjective:    Patient ID: Erin Hayes, female    DOB: [birth year], 57 y.o.   MRN: [id number]  HPI  Patient presents to clinic today for follow-up of chronic conditions.  HTN with NICM: Her BP today is.  She is taking Carvedilol, Furosemide, Entresto, Eliquis and Spironolactone as prescribed.  ECG from 07/2022 reviewed.  HLD: Her last LDL was 150, triglycerides 135, 01/2022.  She denies myalgias on Rosuvastatin.  She does not consume a low-fat diet.  CHF: She denies chronic cough, shortness of breath but does have some lower extremity edema.  She is taking Carvedilol, Furosemide, Entresto and Spironolactone as prescribed.  Echo from 05/2019 reviewed.  DM2: Her last A1c was 7.4%, 01/2022.  She does not check her sugars.  She is taking Lantus, metformin and Bydureon as prescribed.  She does not check her feet routinely.  Her last eye exam was in 2023, [facility].  Flu 03/2020.  Pneumovax 09/2020.  COVID Moderna x 2.  Review of Systems     Past Medical History:  Diagnosis Date   Carpal tunnel syndrome of right wrist    Chronic combined systolic (congestive) and diastolic (congestive) heart failure ([place])    a. 03/2017 Echo: EF 20-25%, diff HK, Gr2 DD; b. 09/2017 Echo: EF 20-25%, diff HK, Gr2 DD.   Diabetes ([place])    GERD (gastroesophageal reflux disease)    HLD (hyperlipidemia)    Hypertension    NICM (nonischemic cardiomyopathy) ([place])    a. 03/2017 Echo: EF 20-25%, diff HK, Gr2 DD, mild to mod MR, nl RV fxn;  b. 03/2017 Cath: mild nonobs dzs, EF 25%; c. 09/2017 Echo: EF 20-25%, diff HK. Gr2 DD. Mild MR. Mildly reduced RV fxn.   Pleural effusion, left    a. 03/2017 s/p thoracentesis-->300 ml withdrawn--transudative.   Pneumonia 02/19/2017    Current Outpatient Medications  Medication Sig Dispense Refill   albuterol (VENTOLIN HFA) 108 (90 Base) MCG/ACT inhaler 2 puff EVERY 4 HOURS (route: inhalation)     apixaban (ELIQUIS) 2.5 MG TABS tablet Take 1 tablet (2.5 mg total) by mouth 2  (two) times daily. 60 tablet 3   carvedilol (COREG) 12.5 MG tablet Take 1.5 tablets (18.75 mg total) by mouth 2 (two) times daily. 270 tablet 3   cetirizine (ZYRTEC) 10 MG tablet Take 1 tablet (10 mg total) by mouth daily. 30 tablet 11   Crisaborole (EUCRISA) 2 % OINT Apply to aa's BID. 100 g 3   Exenatide ER (BYDUREON BCISE) 2 MG/0.85ML AUIJ Inject 2 mg into the skin once a week. 4 pen 1   fluticasone (FLONASE) 50 MCG/ACT nasal spray Place 2 sprays into both nostrils daily. Use for 4-6 weeks then stop and use seasonally or as needed. 16 g 3   furosemide (LASIX) 40 MG tablet Take 1 tablet (40 mg total) by mouth as directed. Take Lasix 40 mg once daily. May take extra lasix after lunch with potassium sparingly for leg swelling 90 tablet 0   HYDROcodone-acetaminophen (NORCO) 5-325 MG tablet Take 1 tablet by mouth every 4 (four) hours as needed for moderate pain. 6 tablet 0   Hyoscyamine Sulfate SL 0.125 MG SUBL hyoscyamine 0.125 mg sublingual tablet     insulin glargine (LANTUS) 100 UNIT/ML Solostar Pen Inject 12 Units into the skin at bedtime. 15 mL 1   Loperamide HCl 1 MG/7.5ML LIQD Imodium A-D 1 mg/7.5 mL oral liquid     methocarbamol (ROBAXIN) 500 MG tablet Take 1 tablet (500 mg total)  by mouth every 8 (eight) hours as needed for muscle spasms. 15 tablet 0   Multiple Vitamin (MULTIVITAMIN WITH MINERALS) TABS tablet Take 1 tablet by mouth daily.     ondansetron (ZOFRAN-ODT) 4 MG disintegrating tablet 1 tablet EVERY 8 HOURS (route: oral)     potassium chloride SA (KLOR-CON M) 20 MEQ tablet Take 1 tablet (20 mEq total) by mouth as directed. Take 1 tablet once daily and Take extra potassium if you take lasix after lunch. 90 tablet 3   rosuvastatin (CRESTOR) 20 MG tablet Take 1 tablet (20 mg total) by mouth daily. 90 tablet 3   sacubitril-valsartan (ENTRESTO) 97-103 MG Take 1 tablet by mouth 2 (two) times daily. 180 tablet 3   spironolactone (ALDACTONE) 25 MG tablet Take 1 tablet (25 mg total) by mouth  daily. 90 tablet 3   tacrolimus (PROTOPIC) 0.1 % ointment Apply topically 2 (two) times daily. 100 g 2   triamcinolone cream (KENALOG) 0.1 % Apply to aa's BID x 2 weeks. Then decrease use to BID 5d/wk. 453.6 g 1   No current facility-administered medications for this visit.    No Known Allergies  Family History  Problem Relation Age of Onset   Lung cancer Mother    Hypertension Father    CAD Father        a. MI age 64   Heart disease Father    COPD Neg Hx    Diabetes Mellitus II Neg Hx     Social History   Socioeconomic History   Marital status: Single    Spouse name: Not on file   Number of children: 3   Years of education: 12   Highest education level: 12th grade  Occupational History   Occupation: Materials engineer  Tobacco Use   Smoking status: Never   Smokeless tobacco: Never  Vaping Use   Vaping Use: Never used  Substance and Sexual Activity   Alcohol use: No   Drug use: No   Sexual activity: Never  Other Topics Concern   Not on file  Social History Narrative   Not on file   Social Determinants of Health   Financial Resource Strain: Medium Risk (04/26/2017)   Overall Financial Resource Strain (CARDIA)    Difficulty of Paying Living Expenses: Somewhat hard  Food Insecurity: Food Insecurity Present (06/06/2017)   Hunger Vital Sign    Worried About [place] in the Last Year: Often true    Ran Out of Food in the Last Year: Often true  Transportation Needs: Unmet Transportation Needs (04/26/2017)   PRAPARE - Hydrologist (Medical): Yes    Lack of Transportation (Non-Medical): Yes  Physical Activity: Insufficiently Active (04/26/2017)   Exercise Vital Sign    Days of Exercise per Week: 1 day    Minutes of Exercise per Session: 10 min  Stress: No Stress Concern Present (04/26/2017)   [place] of Stress : Not at all  Social Connections: Unknown  (04/26/2017)   Social Connection and Isolation Panel [NHANES]    Frequency of Communication with Friends and Family: More than three times a week    Frequency of Social Gatherings with Friends and Family: More than three times a week    Attends Religious Services: More than 4 times per year    Active Member of [employer] or Organizations: Not on file    Attends Archivist Meetings: Never  Marital Status: Not on file  Intimate Partner Violence: Not At Risk (04/26/2017)   Humiliation, Afraid, Rape, and Kick questionnaire    Fear of Current or Ex-Partner: No    Emotionally Abused: No    Physically Abused: No    Sexually Abused: No     Constitutional: Denies fever, malaise, fatigue, headache or abrupt weight changes.  HEENT: Denies eye pain, eye redness, ear pain, ringing in the ears, wax buildup, runny nose, nasal congestion, bloody nose, or sore throat. Respiratory: Denies difficulty breathing, shortness of breath, cough or sputum production.   Cardiovascular: Patient reports swelling in legs.  Denies chest pain, chest tightness, palpitations or swelling in the hands.  Gastrointestinal: Denies abdominal pain, bloating, constipation, diarrhea or blood in the stool.  GU: Denies urgency, frequency, pain with urination, burning sensation, blood in urine, odor or discharge. Musculoskeletal: Denies decrease in range of motion, difficulty with gait, muscle pain or joint pain and swelling.  Skin: Denies redness, rashes, lesions or ulcercations.  Neurological: Denies dizziness, difficulty with memory, difficulty with speech or problems with balance and coordination.  Psych: Denies anxiety, depression, SI/HI.  No other specific complaints in a complete review of systems (except as listed in HPI above).  Objective:   Physical Exam  There were no vitals taken for this visit. Wt Readings from Last 3 Encounters:  07/10/22 119 lb (54 kg)  07/03/22 119 lb (54 kg)  06/26/22 117 lb (53.1 kg)     General: Appears their stated age, well developed, well nourished in NAD. Skin: Warm, dry and intact. No rashes, lesions or ulcerations noted. HEENT: Head: normal shape and size; Eyes: sclera white, no icterus, conjunctiva pink, PERRLA and EOMs intact; Ears: Tm's gray and intact, normal light reflex; Nose: mucosa pink and moist, septum midline; Throat/Mouth: Teeth present, mucosa pink and moist, no exudate, lesions or ulcerations noted.  Neck:  Neck supple, trachea midline. No masses, lumps or thyromegaly present.  Cardiovascular: Normal rate and rhythm. S1,S2 noted.  No murmur, rubs or gallops noted. No JVD or BLE edema. No carotid bruits noted. Pulmonary/Chest: Normal effort and positive vesicular breath sounds. No respiratory distress. No wheezes, rales or ronchi noted.  Abdomen: Soft and nontender. Normal bowel sounds. No distention or masses noted. Liver, spleen and kidneys non palpable. Musculoskeletal: Normal range of motion. No signs of joint swelling. No difficulty with gait.  Neurological: Alert and oriented. Cranial nerves II-XII grossly intact. Coordination normal.  Psychiatric: Mood and affect normal. Behavior is normal. Judgment and thought content normal.    BMET    Component Value Date/Time   NA 140 04/25/2022 1608   NA 142 06/19/2018 1905   NA 129 (L) 04/25/2013 2004   K 3.1 (L) 04/25/2022 1608   K 5.7 (H) 04/25/2013 2004   CL 105 04/25/2022 1608   CL 97 (L) 04/25/2013 2004   CO2 23 04/25/2022 1608   CO2 12 (L) 04/25/2013 2004   GLUCOSE 193 (H) 04/25/2022 1608   GLUCOSE 370 (H) 04/25/2013 2004   BUN 16 04/25/2022 1608   BUN 14 06/19/2018 1905   BUN 17 04/25/2013 2004   CREATININE 0.85 04/25/2022 1608   CREATININE 0.89 01/19/2022 1105   CALCIUM 8.3 (L) 04/25/2022 1608   CALCIUM 9.7 04/25/2013 2004   GFRNONAA >60 04/25/2022 1608   GFRNONAA 66 03/28/2020 1044   GFRAA 77 03/28/2020 1044    Lipid Panel     Component Value Date/Time   CHOL 241 (H) 01/19/2022  1105   CHOL  216 (H) 06/19/2018 1905   TRIG 135 01/19/2022 1105   HDL 65 01/19/2022 1105   HDL 53 06/19/2018 1905   CHOLHDL 3.7 01/19/2022 1105   LDLCALC 150 (H) 01/19/2022 1105    CBC    Component Value Date/Time   WBC 9.9 04/25/2022 1608   RBC 4.80 04/25/2022 1608   HGB 13.1 04/25/2022 1608   HGB 11.6 06/19/2018 1905   HCT 40.9 04/25/2022 1608   HCT 35.4 06/19/2018 1905   PLT 352 04/25/2022 1608   PLT 435 06/19/2018 1905   MCV 85.2 04/25/2022 1608   MCV 85 06/19/2018 1905   MCV 88 04/25/2013 2004   MCH 27.3 04/25/2022 1608   MCHC 32.0 04/25/2022 1608   RDW 12.9 04/25/2022 1608   RDW 12.4 06/19/2018 1905   RDW 12.3 04/25/2013 2004   LYMPHSABS 2.0 04/25/2022 1608   MONOABS 0.5 04/25/2022 1608   EOSABS 0.1 04/25/2022 1608   BASOSABS 0.1 04/25/2022 1608    Hgb A1C Lab Results  Component Value Date   HGBA1C 7.4 (H) 01/19/2022            Assessment & Plan:      RTC in 6 months for your annual exam Webb Silversmith, NP

## 2022-09-18 ENCOUNTER — Ambulatory Visit: Payer: Medicare Other | Admitting: Podiatry

## 2022-09-25 ENCOUNTER — Ambulatory Visit (INDEPENDENT_AMBULATORY_CARE_PROVIDER_SITE_OTHER): Payer: 59 | Admitting: Podiatry

## 2022-09-25 ENCOUNTER — Ambulatory Visit (INDEPENDENT_AMBULATORY_CARE_PROVIDER_SITE_OTHER): Payer: 59

## 2022-09-25 DIAGNOSIS — S9032XA Contusion of left foot, initial encounter: Secondary | ICD-10-CM | POA: Diagnosis not present

## 2022-09-25 DIAGNOSIS — S92302A Fracture of unspecified metatarsal bone(s), left foot, initial encounter for closed fracture: Secondary | ICD-10-CM | POA: Diagnosis not present

## 2022-09-25 NOTE — Progress Notes (Signed)
Subjective:  Patient ID: Erin Hayes, female    DOB: 05/18/1965,  MRN: [id number]  No chief complaint on file.   57 y.o. female presents with the above complaint.  Patient presents with new complaint of left second through fifth closed surgical neck fracture.  Patient states that it is causing her some pain.  She wanted to get it evaluated she states that she does not recall any injury it may have been due to stress.  She denies seeing anyone else prior to seeing me   Review of Systems: Negative except as noted in the HPI. Denies N/V/F/Ch.  Past Medical History:  Diagnosis Date   Carpal tunnel syndrome of right wrist    Chronic combined systolic (congestive) and diastolic (congestive) heart failure (HCC)    a. 03/2017 Echo: EF 20-25%, diff HK, Gr2 DD; b. 09/2017 Echo: EF 20-25%, diff HK, Gr2 DD.   Diabetes (HCC)    GERD (gastroesophageal reflux disease)    HLD (hyperlipidemia)    Hypertension    NICM (nonischemic cardiomyopathy) (HCC)    a. 03/2017 Echo: EF 20-25%, diff HK, Gr2 DD, mild to mod MR, nl RV fxn;  b. 03/2017 Cath: mild nonobs dzs, EF 25%; c. 09/2017 Echo: EF 20-25%, diff HK. Gr2 DD. Mild MR. Mildly reduced RV fxn.   Pleural effusion, left    a. 03/2017 s/p thoracentesis-->300 ml withdrawn--transudative.   Pneumonia 02/19/2017    Current Outpatient Medications:    albuterol (VENTOLIN HFA) 108 (90 Base) MCG/ACT inhaler, 2 puff EVERY 4 HOURS (route: inhalation), Disp: , Rfl:    apixaban (ELIQUIS) 2.5 MG TABS tablet, Take 1 tablet (2.5 mg total) by mouth 2 (two) times daily., Disp: 60 tablet, Rfl: 3   carvedilol (COREG) 12.5 MG tablet, Take 1.5 tablets (18.75 mg total) by mouth 2 (two) times daily., Disp: 270 tablet, Rfl: 3   cetirizine (ZYRTEC) 10 MG tablet, Take 1 tablet (10 mg total) by mouth daily., Disp: 30 tablet, Rfl: 11   Crisaborole (EUCRISA) 2 % OINT, Apply to aa's BID., Disp: 100 g, Rfl: 3   Exenatide ER (BYDUREON BCISE) 2 MG/0.85ML AUIJ, Inject 2 mg into the  skin once a week., Disp: 4 pen, Rfl: 1   fluticasone (FLONASE) 50 MCG/ACT nasal spray, Place 2 sprays into both nostrils daily. Use for 4-6 weeks then stop and use seasonally or as needed., Disp: 16 g, Rfl: 3   furosemide (LASIX) 40 MG tablet, Take 1 tablet (40 mg total) by mouth as directed. Take Lasix 40 mg once daily. May take extra lasix after lunch with potassium sparingly for leg swelling, Disp: 90 tablet, Rfl: 0   HYDROcodone-acetaminophen (NORCO) 5-325 MG tablet, Take 1 tablet by mouth every 4 (four) hours as needed for moderate pain., Disp: 6 tablet, Rfl: 0   Hyoscyamine Sulfate SL 0.125 MG SUBL, hyoscyamine 0.125 mg sublingual tablet, Disp: , Rfl:    insulin glargine (LANTUS) 100 UNIT/ML Solostar Pen, Inject 12 Units into the skin at bedtime., Disp: 15 mL, Rfl: 1   Loperamide HCl 1 MG/7.5ML LIQD, Imodium A-D 1 mg/7.5 mL oral liquid, Disp: , Rfl:    methocarbamol (ROBAXIN) 500 MG tablet, Take 1 tablet (500 mg total) by mouth every 8 (eight) hours as needed for muscle spasms., Disp: 15 tablet, Rfl: 0   Multiple Vitamin (MULTIVITAMIN WITH MINERALS) TABS tablet, Take 1 tablet by mouth daily., Disp: , Rfl:    ondansetron (ZOFRAN-ODT) 4 MG disintegrating tablet, 1 tablet EVERY 8 HOURS (route: oral), Disp: , Rfl:  potassium chloride SA (KLOR-CON M) 20 MEQ tablet, Take 1 tablet (20 mEq total) by mouth as directed. Take 1 tablet once daily and Take extra potassium if you take lasix after lunch., Disp: 90 tablet, Rfl: 3   rosuvastatin (CRESTOR) 20 MG tablet, Take 1 tablet (20 mg total) by mouth daily., Disp: 90 tablet, Rfl: 3   sacubitril-valsartan (ENTRESTO) 97-103 MG, Take 1 tablet by mouth 2 (two) times daily., Disp: 180 tablet, Rfl: 3   spironolactone (ALDACTONE) 25 MG tablet, Take 1 tablet (25 mg total) by mouth daily., Disp: 90 tablet, Rfl: 3   tacrolimus (PROTOPIC) 0.1 % ointment, Apply topically 2 (two) times daily., Disp: 100 g, Rfl: 2   triamcinolone cream (KENALOG) 0.1 %, Apply to aa's  BID x 2 weeks. Then decrease use to BID 5d/wk., Disp: 453.6 g, Rfl: 1  Social History   Tobacco Use  Smoking Status Never  Smokeless Tobacco Never    No Known Allergies Objective:  There were no vitals filed for this visit. There is no height or weight on file to calculate BMI. Constitutional Well developed. Well nourished.  Vascular Dorsalis pedis pulses palpable bilaterally. Posterior tibial pulses palpable bilaterally. Capillary refill normal to all digits.  No cyanosis or clubbing noted. Pedal hair growth normal.  Neurologic Normal speech. Oriented to person, place, and time. Epicritic sensation to light touch grossly present bilaterally.  Dermatologic Nails well groomed and normal in appearance. No open wounds. No skin lesions.  Orthopedic: Pain on palpation across the metatarsal head second through 5.  Pain with range of motion of the digits.   Radiographs: 3 views discussed mature adult left foot: Fractures noted at left second through fifth surgical neck.  Minimally displaced likely due to stress.  Osteoporosis noted of the bone Assessment:   1. Contusion of left foot, initial encounter   2. Closed fracture of head of metatarsal, left, initial encounter    Plan:  Patient was evaluated and treated and all questions answered.  Left second through fifth nondisplaced metatarsal head fracture -All questions or concerns were discussed with the patient in extensive detail.  Given that this is not due to traumatic injury is likely due to stress I encourage increased vitamin D intake as well as offloading with cam boot immobilization.  She states understanding will wear cam boot -Cam boot was dispensed  No follow-ups on file.   Left metatarsal 2 through 5 surgical neck stress fracture cam boot immobilization

## 2022-11-20 ENCOUNTER — Ambulatory Visit: Payer: 59 | Admitting: Podiatry

## 2022-11-21 ENCOUNTER — Ambulatory Visit: Payer: 59

## 2022-11-28 DIAGNOSIS — I110 Hypertensive heart disease with heart failure: Secondary | ICD-10-CM | POA: Diagnosis not present

## 2022-11-28 DIAGNOSIS — Z1152 Encounter for screening for COVID-19: Secondary | ICD-10-CM | POA: Diagnosis not present

## 2022-11-28 DIAGNOSIS — I5020 Unspecified systolic (congestive) heart failure: Secondary | ICD-10-CM | POA: Diagnosis not present

## 2022-11-28 DIAGNOSIS — I5022 Chronic systolic (congestive) heart failure: Secondary | ICD-10-CM | POA: Diagnosis not present

## 2022-11-28 DIAGNOSIS — I10 Essential (primary) hypertension: Secondary | ICD-10-CM | POA: Diagnosis not present

## 2022-11-28 DIAGNOSIS — N178 Other acute kidney failure: Secondary | ICD-10-CM | POA: Diagnosis not present

## 2022-11-28 DIAGNOSIS — I2489 Other forms of acute ischemic heart disease: Secondary | ICD-10-CM | POA: Diagnosis not present

## 2022-11-28 DIAGNOSIS — T508X5A Adverse effect of diagnostic agents, initial encounter: Secondary | ICD-10-CM | POA: Diagnosis not present

## 2022-11-28 DIAGNOSIS — I82220 Acute embolism and thrombosis of inferior vena cava: Secondary | ICD-10-CM | POA: Diagnosis not present

## 2022-11-28 DIAGNOSIS — I3139 Other pericardial effusion (noninflammatory): Secondary | ICD-10-CM | POA: Diagnosis not present

## 2022-11-28 DIAGNOSIS — R1011 Right upper quadrant pain: Secondary | ICD-10-CM | POA: Diagnosis not present

## 2022-11-28 DIAGNOSIS — E1010 Type 1 diabetes mellitus with ketoacidosis without coma: Secondary | ICD-10-CM | POA: Diagnosis not present

## 2022-11-28 DIAGNOSIS — I214 Non-ST elevation (NSTEMI) myocardial infarction: Secondary | ICD-10-CM | POA: Diagnosis not present

## 2022-11-28 DIAGNOSIS — I429 Cardiomyopathy, unspecified: Secondary | ICD-10-CM | POA: Diagnosis not present

## 2022-11-28 DIAGNOSIS — I82423 Acute embolism and thrombosis of iliac vein, bilateral: Secondary | ICD-10-CM | POA: Diagnosis not present

## 2022-11-28 DIAGNOSIS — N179 Acute kidney failure, unspecified: Secondary | ICD-10-CM | POA: Diagnosis not present

## 2022-11-28 DIAGNOSIS — N1411 Contrast-induced nephropathy: Secondary | ICD-10-CM | POA: Diagnosis not present

## 2022-11-28 DIAGNOSIS — I823 Embolism and thrombosis of renal vein: Secondary | ICD-10-CM | POA: Diagnosis not present

## 2022-11-28 DIAGNOSIS — R1031 Right lower quadrant pain: Secondary | ICD-10-CM | POA: Diagnosis not present

## 2022-11-28 DIAGNOSIS — Z7984 Long term (current) use of oral hypoglycemic drugs: Secondary | ICD-10-CM | POA: Diagnosis not present

## 2022-11-28 DIAGNOSIS — I255 Ischemic cardiomyopathy: Secondary | ICD-10-CM | POA: Diagnosis not present

## 2022-11-28 DIAGNOSIS — Z86718 Personal history of other venous thrombosis and embolism: Secondary | ICD-10-CM | POA: Diagnosis not present

## 2022-11-28 DIAGNOSIS — R9431 Abnormal electrocardiogram [ECG] [EKG]: Secondary | ICD-10-CM | POA: Diagnosis not present

## 2022-11-28 DIAGNOSIS — E7800 Pure hypercholesterolemia, unspecified: Secondary | ICD-10-CM | POA: Diagnosis not present

## 2022-11-28 DIAGNOSIS — I82493 Acute embolism and thrombosis of other specified deep vein of lower extremity, bilateral: Secondary | ICD-10-CM | POA: Diagnosis not present

## 2022-11-28 DIAGNOSIS — Z7901 Long term (current) use of anticoagulants: Secondary | ICD-10-CM | POA: Diagnosis not present

## 2022-11-28 DIAGNOSIS — R0600 Dyspnea, unspecified: Secondary | ICD-10-CM | POA: Diagnosis not present

## 2022-11-28 DIAGNOSIS — I82413 Acute embolism and thrombosis of femoral vein, bilateral: Secondary | ICD-10-CM | POA: Diagnosis not present

## 2022-11-28 DIAGNOSIS — Z7982 Long term (current) use of aspirin: Secondary | ICD-10-CM | POA: Diagnosis not present

## 2022-12-06 ENCOUNTER — Ambulatory Visit: Payer: 59 | Admitting: Podiatry

## 2022-12-06 ENCOUNTER — Telehealth: Payer: Self-pay | Admitting: *Deleted

## 2022-12-06 NOTE — Transitions of Care (Post Inpatient/ED Visit) (Signed)
   12/06/2022  Name: Erin Hayes MRN: [id number] DOB: [birth year]  Today's TOC FU Call Status: Today's TOC FU Call Status:: Unsuccessful Call (1st Attempt) Unsuccessful Call (1st Attempt) Date: 12/06/22  Attempted to reach the patient regarding the most recent Inpatient/ED visit.  Follow Up Plan: Additional outreach attempts will be made to reach the patient to complete the Transitions of Care (Post Inpatient/ED visit) call.   Gean Maidens BSN RN [facility] [phone]

## 2022-12-07 ENCOUNTER — Ambulatory Visit: Payer: 59

## 2022-12-07 DIAGNOSIS — Z0000 Encounter for general adult medical examination without abnormal findings: Secondary | ICD-10-CM | POA: Diagnosis not present

## 2022-12-07 DIAGNOSIS — E119 Type 2 diabetes mellitus without complications: Secondary | ICD-10-CM

## 2022-12-07 DIAGNOSIS — Z1211 Encounter for screening for malignant neoplasm of colon: Secondary | ICD-10-CM

## 2022-12-07 NOTE — Progress Notes (Signed)
Subjective:   Erin Hayes is a 57 y.o. female who presents for an Initial Medicare Annual Wellness Visit.  Visit Complete: Virtual  I connected with  Erin Hayes on 12/07/22 by a audio enabled telemedicine application and verified that I am speaking with the correct person using two identifiers.  Patient Location: Home  Provider Location: Office/Clinic  I discussed the limitations of evaluation and management by telemedicine. The patient expressed understanding and agreed to proceed.  Vital Signs: Patient was unable to self-report vital signs via telehealth due to a lack of equipment at home.  Review of Systems     Cardiac Risk Factors include: advanced age (>31men, >51 women);diabetes mellitus;hypertension     Objective:    There were no vitals filed for this visit. There is no height or weight on file to calculate BMI.     12/07/2022   10:33 AM 06/26/2022    8:27 AM 04/25/2022    4:07 PM 06/19/2021    2:08 PM 06/18/2021   10:22 PM 06/18/2021    8:00 PM 06/18/2021    4:47 PM  Advanced Directives  Does Patient Have a Medical Advance Directive? No No No No  No No  Would patient like information on creating a medical advance directive? No - Patient declined No - Patient declined No - Patient declined No - Patient declined No - Patient declined      Current Medications (verified) Outpatient Encounter Medications as of 12/07/2022  Medication Sig   albuterol (VENTOLIN HFA) 108 (90 Base) MCG/ACT inhaler 2 puff EVERY 4 HOURS (route: inhalation)   apixaban (ELIQUIS) 2.5 MG TABS tablet Take 1 tablet (2.5 mg total) by mouth 2 (two) times daily.   Crisaborole (EUCRISA) 2 % OINT Apply to aa's BID.   fluticasone (FLONASE) 50 MCG/ACT nasal spray Place 2 sprays into both nostrils daily. Use for 4-6 weeks then stop and use seasonally or as needed.   furosemide (LASIX) 40 MG tablet Take 1 tablet (40 mg total) by mouth as directed. Take Lasix 40 mg once daily. May take extra lasix after  lunch with potassium sparingly for leg swelling   HYDROcodone-acetaminophen (NORCO) 5-325 MG tablet Take 1 tablet by mouth every 4 (four) hours as needed for moderate pain.   Hyoscyamine Sulfate SL 0.125 MG SUBL hyoscyamine 0.125 mg sublingual tablet   insulin glargine (LANTUS) 100 UNIT/ML Solostar Pen Inject 12 Units into the skin at bedtime.   Loperamide HCl 1 MG/7.5ML LIQD Imodium A-D 1 mg/7.5 mL oral liquid   methocarbamol (ROBAXIN) 500 MG tablet Take 1 tablet (500 mg total) by mouth every 8 (eight) hours as needed for muscle spasms.   Multiple Vitamin (MULTIVITAMIN WITH MINERALS) TABS tablet Take 1 tablet by mouth daily.   potassium chloride SA (KLOR-CON M) 20 MEQ tablet Take 1 tablet (20 mEq total) by mouth as directed. Take 1 tablet once daily and Take extra potassium if you take lasix after lunch.   rosuvastatin (CRESTOR) 20 MG tablet Take 1 tablet (20 mg total) by mouth daily.   sacubitril-valsartan (ENTRESTO) 97-103 MG Take 1 tablet by mouth 2 (two) times daily.   spironolactone (ALDACTONE) 25 MG tablet Take 1 tablet (25 mg total) by mouth daily.   tacrolimus (PROTOPIC) 0.1 % ointment Apply topically 2 (two) times daily.   triamcinolone cream (KENALOG) 0.1 % Apply to aa's BID x 2 weeks. Then decrease use to BID 5d/wk.   carvedilol (COREG) 12.5 MG tablet Take 1.5 tablets (18.75 mg total) by  mouth 2 (two) times daily.   cetirizine (ZYRTEC) 10 MG tablet Take 1 tablet (10 mg total) by mouth daily. (Patient not taking: Reported on 12/07/2022)   Exenatide ER (BYDUREON BCISE) 2 MG/0.85ML AUIJ Inject 2 mg into the skin once a week. (Patient not taking: Reported on 12/07/2022)   ondansetron (ZOFRAN-ODT) 4 MG disintegrating tablet 1 tablet EVERY 8 HOURS (route: oral) (Patient not taking: Reported on 12/07/2022)   No facility-administered encounter medications on file as of 12/07/2022.    Allergies (verified) Patient has no known allergies.   History: Past Medical History:  Diagnosis Date   Carpal  tunnel syndrome of right wrist    Chronic combined systolic (congestive) and diastolic (congestive) heart failure (HCC)    a. 03/2017 Echo: EF 20-25%, diff HK, Gr2 DD; b. 09/2017 Echo: EF 20-25%, diff HK, Gr2 DD.   Diabetes (HCC)    GERD (gastroesophageal reflux disease)    HLD (hyperlipidemia)    Hypertension    NICM (nonischemic cardiomyopathy) (HCC)    a. 03/2017 Echo: EF 20-25%, diff HK, Gr2 DD, mild to mod MR, nl RV fxn;  b. 03/2017 Cath: mild nonobs dzs, EF 25%; c. 09/2017 Echo: EF 20-25%, diff HK. Gr2 DD. Mild MR. Mildly reduced RV fxn.   Pleural effusion, left    a. 03/2017 s/p thoracentesis-->300 ml withdrawn--transudative.   Pneumonia 02/19/2017   Past Surgical History:  Procedure Laterality Date   COLONOSCOPY WITH PROPOFOL N/A 01/21/2017   Procedure: COLONOSCOPY WITH PROPOFOL;  Surgeon: Wyline Mood, MD;  Location[facility] ENDOSCOPY;  Service: Gastroenterology;  Laterality: N/A;   ESOPHAGOGASTRODUODENOSCOPY (EGD) WITH PROPOFOL N/A 01/21/2017   Procedure: ESOPHAGOGASTRODUODENOSCOPY (EGD) WITH PROPOFOL;  Surgeon: Wyline Mood, MD;  Location[facility] ENDOSCOPY;  Service: Gastroenterology;  Laterality: N/A;   FLEXIBLE SIGMOIDOSCOPY N/A 11/04/2016   Procedure: FLEXIBLE SIGMOIDOSCOPY;  Surgeon: Charlott Rakes, MD;  Location: [facility] ENDOSCOPY;  Service: Endoscopy;  Laterality: N/A;   HIP PINNING,CANNULATED Left 06/19/2021   Procedure: CANNULATED HIP PINNING;  Surgeon: Ross Marcus, MD;  Location: [facility] ORS;  Service: Orthopedics;  Laterality: Left;   LEFT HEART CATH AND CORONARY ANGIOGRAPHY N/A 04/05/2017   Procedure: LEFT HEART CATH AND CORONARY ANGIOGRAPHY;  Surgeon: Iran Ouch, MD;  Location: [facility] INVASIVE CV LAB;  Service: Cardiovascular;  Laterality: N/A;   LITHOTRIPSY     Family History  Problem Relation Age of Onset   Lung cancer Mother    Hypertension Father    CAD Father        a. MI age 101   Heart disease Father    COPD Neg Hx    Diabetes Mellitus II Neg Hx    Social  History   Socioeconomic History   Marital status: Single    Spouse name: Not on file   Number of children: 3   Years of education: 12   Highest education level: 12th grade  Occupational History   Occupation: Arts development officer  Tobacco Use   Smoking status: Never   Smokeless tobacco: Never  Vaping Use   Vaping status: Never Used  Substance and Sexual Activity   Alcohol use: No   Drug use: No   Sexual activity: Never  Other Topics Concern   Not on file  Social History Narrative   Not on file   Social Determinants of Health   Financial Resource Strain: Low Risk  (12/07/2022)   Overall Financial Resource Strain (CARDIA)    Difficulty of Paying Living Expenses: Not hard at all  Food Insecurity: No Food Insecurity (  12/07/2022)   Hunger Vital Sign    Worried About Running Out of Food in the Last Year: Never true    Ran Out of Food in the Last Year: Never true  Transportation Needs: No Transportation Needs (12/07/2022)   PRAPARE - Administrator, Civil Service (Medical): No    Lack of Transportation (Non-Medical): No  Physical Activity: Unknown (12/07/2022)   Exercise Vital Sign    Days of Exercise per Week: 2 days    Minutes of Exercise per Session: Not on file  Stress: No Stress Concern Present (12/07/2022)   [employer] of Occupational Health - Occupational Stress Questionnaire    Feeling of Stress : Not at all  Social Connections: Moderately Isolated (12/07/2022)   Social Connection and Isolation Panel [NHANES]    Frequency of Communication with Friends and Family: More than three times a week    Frequency of Social Gatherings with Friends and Family: More than three times a week    Attends Religious Services: More than 4 times per year    Active Member of [employer] or Organizations: No    Attends Engineer, structural: Never    Marital Status: Divorced    Tobacco Counseling Counseling given: Not Answered   Clinical Intake:  Pre-visit preparation completed:  Yes  Pain : No/denies pain     Nutritional Risks: None Diabetes: Yes CBG done?: No Did pt. bring in CBG monitor from home?: No  How often do you need to have someone help you when you read instructions, pamphlets, or other written materials from your doctor or pharmacy?: 1 - Never  Interpreter Needed?: No  Information entered by :: Kennedy Bucker, LPN   Activities of Daily Living    12/07/2022   10:33 AM 04/26/2022    3:30 PM  In your present state of health, do you have any difficulty performing the following activities:  Hearing? 0 0  Vision? 0 1  Difficulty concentrating or making decisions? 0 0  Walking or climbing stairs? 1 1  Comment right now- has foot fx   Dressing or bathing? 0 0  Doing errands, shopping? 0 0  Preparing Food and eating ? N   Using the Toilet? N   In the past six months, have you accidently leaked urine? N   Do you have problems with loss of bowel control? N   Managing your Medications? N   Managing your Finances? N   Housekeeping or managing your Housekeeping? N     Patient Care Team: Lorre Munroe, NP as PCP - General (Internal Medicine) Antonieta Iba, MD as PCP - Cardiology (Cardiology) Antonieta Iba, MD as Consulting Physician (Cardiology) Delma Freeze, FNP as Nurse Practitioner (Family Medicine)  Indicate any recent Medical Services you may have received from other than [facility] providers in the past year (date may be approximate).     Assessment:   This is a routine wellness examination for Erin Hayes.  Hearing/Vision screen Hearing Screening - Comments:: No aids Vision Screening - Comments:: Wears glasses- [facility]  Dietary issues and exercise activities discussed:     Goals Addressed             This Visit's Progress    DIET - EAT MORE FRUITS AND VEGETABLES         Depression Screen    12/07/2022   10:31 AM 04/26/2022    3:30 PM 01/19/2022   10:55 AM 07/13/2021    9:32 AM 09/09/2020  2:10 PM 05/18/2020     9:31 AM 05/18/2019   11:24 AM  PHQ 2/9 Scores  PHQ - 2 Score 0 0 0 0 0 0 0  PHQ- 9 Score 0  1 1 2       Fall Risk    12/07/2022   10:33 AM 04/26/2022    3:30 PM 01/19/2022   10:54 AM 07/13/2021    9:32 AM 09/09/2020    2:11 PM  Fall Risk   Falls in the past year? 0 0 1 1 0  Number falls in past yr: 0  1 1 0  Injury with Fall? 0 0 1 1   Risk for fall due to : No Fall Risks  History of fall(s) History of fall(s) No Fall Risks  Follow up Falls prevention discussed;Falls evaluation completed  Falls evaluation completed Falls evaluation completed Falls evaluation completed    MEDICARE RISK AT HOME:  Medicare Risk at Home - 12/07/22 1034     Any stairs in or around the home? No    If so, are there any without handrails? No    Home free of loose throw rugs in walkways, pet beds, electrical cords, etc? Yes    Adequate lighting in your home to reduce risk of falls? Yes    Life alert? No    Use of a cane, walker or w/c? Yes   walker   Grab bars in the bathroom? No    Shower chair or bench in shower? No    Elevated toilet seat or a handicapped toilet? No             TIMED UP AND GO:  Was the test performed? No    Cognitive Function:        12/07/2022   10:38 AM  6CIT Screen  What Year? 0 points  What month? 0 points  What time? 0 points  Count back from 20 0 points  Months in reverse 0 points  Repeat phrase 0 points  Total Score 0 points    Immunizations Immunization History  Administered Date(s) Administered   Influenza,inj,Quad PF,6+ Mos 04/29/2013, 01/24/2018, 05/18/2019   Influenza-Unspecified 02/10/2015   Moderna Sars-Covid-2 Vaccination 10/09/2019, 11/06/2019   PPD Test 01/24/2021   Pneumococcal Polysaccharide-23 12/12/2011, 04/29/2013, 09/09/2020    TDAP status: Due, Education has been provided regarding the importance of this vaccine. Advised may receive this vaccine at local pharmacy or Health Dept. Aware to provide a copy of the vaccination record if  obtained from local pharmacy or Health Dept. Verbalized acceptance and understanding.  Flu Vaccine status: Declined, Education has been provided regarding the importance of this vaccine but patient still declined. Advised may receive this vaccine at local pharmacy or Health Dept. Aware to provide a copy of the vaccination record if obtained from local pharmacy or Health Dept. Verbalized acceptance and understanding.  Pneumococcal vaccine status: Up to date  Covid-19 vaccine status: Completed vaccines  Qualifies for Shingles Vaccine? Yes   Zostavax completed No   Shingrix Completed?: No.    Education has been provided regarding the importance of this vaccine. Patient has been advised to call insurance company to determine out of pocket expense if they have not yet received this vaccine. Advised may also receive vaccine at local pharmacy or Health Dept. Verbalized acceptance and understanding.  Screening Tests Health Maintenance  Topic Date Due   DTaP/Tdap/Td (1 - Tdap) Never done   PAP SMEAR-Modifier  Never done   MAMMOGRAM  Never done  Zoster Vaccines- Shingrix (1 of 2) Never done   Colonoscopy  01/21/2022   OPHTHALMOLOGY EXAM  05/11/2022   HEMOGLOBIN A1C  07/20/2022   FOOT EXAM  09/06/2022   INFLUENZA VACCINE  12/06/2022   COVID-19 Vaccine (3 - 2023-24 season) 01/06/2023 (Originally 01/05/2022)   Diabetic kidney evaluation - Urine ACR  01/20/2023   Diabetic kidney evaluation - eGFR measurement  04/26/2023   Medicare Annual Wellness (AWV)  12/07/2023   Hepatitis C Screening  Completed   HIV Screening  Completed   HPV VACCINES  Aged Out    Health Maintenance  Health Maintenance Due  Topic Date Due   DTaP/Tdap/Td (1 - Tdap) Never done   PAP SMEAR-Modifier  Never done   MAMMOGRAM  Never done   Zoster Vaccines- Shingrix (1 of 2) Never done   Colonoscopy  01/21/2022   OPHTHALMOLOGY EXAM  05/11/2022   HEMOGLOBIN A1C  07/20/2022   FOOT EXAM  09/06/2022   INFLUENZA VACCINE   12/06/2022    Colorectal cancer screening: Type of screening: Colonoscopy. Completed 01/21/17. Repeat every 5 years  Mammogram status: Ordered 01/19/22. Pt provided with contact info and advised to call to schedule appt.    Lung Cancer Screening: (Low Dose CT Chest recommended if Age 34-80 years, 20 pack-year currently smoking OR have quit w/in 15years.) does not qualify.    Additional Screening:  Hepatitis C Screening: does qualify; Completed 01/19/22  Vision Screening: Recommended annual ophthalmology exams for early detection of glaucoma and other disorders of the eye. Is the patient up to date with their annual eye exam?  Yes  Who is the provider or what is the name of the office in which the patient attends annual eye exams?  Eye If pt is not established with a provider, would they like to be referred to a provider to establish care? No .   Dental Screening: Recommended annual dental exams for proper oral hygiene  Diabetic Foot Exam: Diabetic Foot Exam: Completed 09/05/21  Community Resource Referral / Chronic Care Management: CRR required this visit?  No   CCM required this visit?  No     Plan:     I have personally reviewed and noted the following in the patient's chart:   Medical and social history Use of alcohol, tobacco or illicit drugs  Current medications and supplements including opioid prescriptions. Patient is currently taking opioid prescriptions. Information provided to patient regarding non-opioid alternatives. Patient advised to discuss non-opioid treatment plan with their provider. Functional ability and status Nutritional status Physical activity Advanced directives List of other physicians Hospitalizations, surgeries, and ER visits in previous 12 months Vitals Screenings to include cognitive, depression, and falls Referrals and appointments  In addition, I have reviewed and discussed with patient certain preventive protocols, quality metrics,  and best practice recommendations. A written personalized care plan for preventive services as well as general preventive health recommendations were provided to patient.     Hal Hope, LPN   4/0/9811   After Visit Summary: (MyChart) Due to this being a telephonic visit, the after visit summary with patients personalized plan was offered to patient via MyChart   Nurse Notes: none

## 2022-12-07 NOTE — Patient Instructions (Addendum)
Erin Hayes , Thank you for taking time to come for your Medicare Wellness Visit. I appreciate your ongoing commitment to your health goals. Please review the following plan we discussed and let me know if I can assist you in the future.   Referrals/Orders/Follow-Ups/Clinician Recommendations: referral for colonoscopy and nutritionist sent  This is a list of the screening recommended for you and due dates:  Health Maintenance  Topic Date Due   DTaP/Tdap/Td vaccine (1 - Tdap) Never done   Pap Smear  Never done   Mammogram  Never done   Zoster (Shingles) Vaccine (1 of 2) Never done   Colon Cancer Screening  01/21/2022   Eye exam for diabetics  05/11/2022   Hemoglobin A1C  07/20/2022   Complete foot exam   09/06/2022   Flu Shot  12/06/2022   COVID-19 Vaccine (3 - 2023-24 season) 01/06/2023*   Yearly kidney health urinalysis for diabetes  01/20/2023   Yearly kidney function blood test for diabetes  04/26/2023   Medicare Annual Wellness Visit  12/07/2023   Hepatitis C Screening  Completed   HIV Screening  Completed   HPV Vaccine  Aged Out  *Topic was postponed. The date shown is not the original due date.    Advanced directives: (Declined) Advance directive discussed with you today. Even though you declined this today, please call our office should you change your mind, and we can give you the proper paperwork for you to fill out.  Next Medicare Annual Wellness Visit scheduled for next year: Yes   12/12/23 @ 9:15 am by phone  Preventive Care 40-64 Years, Female Preventive care refers to lifestyle choices and visits with your health care provider that can promote health and wellness. What does preventive care include? A yearly physical exam. This is also called an annual well check. Dental exams once or twice a year. Routine eye exams. Ask your health care provider how often you should have your eyes checked. Personal lifestyle choices, including: Daily care of your teeth and  gums. Regular physical activity. Eating a healthy diet. Avoiding tobacco and drug use. Limiting alcohol use. Practicing safe sex. Taking low-dose aspirin daily starting at age 9. Taking vitamin and mineral supplements as recommended by your health care provider. What happens during an annual well check? The services and screenings done by your health care provider during your annual well check will depend on your age, overall health, lifestyle risk factors, and family history of disease. Counseling  Your health care provider may ask you questions about your: Alcohol use. Tobacco use. Drug use. Emotional well-being. Home and relationship well-being. Sexual activity. Eating habits. Work and work Astronomer. Method of birth control. Menstrual cycle. Pregnancy history. Screening  You may have the following tests or measurements: Height, weight, and BMI. Blood pressure. Lipid and cholesterol levels. These may be checked every 5 years, or more frequently if you are over 60 years old. Skin check. Lung cancer screening. You may have this screening every year starting at age 61 if you have a 30-pack-year history of smoking and currently smoke or have quit within the past 15 years. Fecal occult blood test (FOBT) of the stool. You may have this test every year starting at age 41. Flexible sigmoidoscopy or colonoscopy. You may have a sigmoidoscopy every 5 years or a colonoscopy every 10 years starting at age 55. Hepatitis C blood test. Hepatitis B blood test. Sexually transmitted disease (STD) testing. Diabetes screening. This is done by checking your blood sugar (glucose)  after you have not eaten for a while (fasting). You may have this done every 1-3 years. Mammogram. This may be done every 1-2 years. Talk to your health care provider about when you should start having regular mammograms. This may depend on whether you have a family history of breast cancer. BRCA-related cancer  screening. This may be done if you have a family history of breast, ovarian, tubal, or peritoneal cancers. Pelvic exam and Pap test. This may be done every 3 years starting at age 72. Starting at age 53, this may be done every 5 years if you have a Pap test in combination with an HPV test. Bone density scan. This is done to screen for osteoporosis. You may have this scan if you are at high risk for osteoporosis. Discuss your test results, treatment options, and if necessary, the need for more tests with your health care provider. Vaccines  Your health care provider may recommend certain vaccines, such as: Influenza vaccine. This is recommended every year. Tetanus, diphtheria, and acellular pertussis (Tdap, Td) vaccine. You may need a Td booster every 10 years. Zoster vaccine. You may need this after age 76. Pneumococcal 13-valent conjugate (PCV13) vaccine. You may need this if you have certain conditions and were not previously vaccinated. Pneumococcal polysaccharide (PPSV23) vaccine. You may need one or two doses if you smoke cigarettes or if you have certain conditions. Talk to your health care provider about which screenings and vaccines you need and how often you need them. This information is not intended to replace advice given to you by your health care provider. Make sure you discuss any questions you have with your health care provider. Document Released: 05/20/2015 Document Revised: 01/11/2016 Document Reviewed: 02/22/2015 Elsevier Interactive Patient Education  2017 [employer].    Fall Prevention in the Home Falls can cause injuries. They can happen to people of all ages. There are many things you can do to make your home safe and to help prevent falls. What can I do on the outside of my home? Regularly fix the edges of walkways and driveways and fix any cracks. Remove anything that might make you trip as you walk through a door, such as a raised step or threshold. Trim any  bushes or trees on the path to your home. Use bright outdoor lighting. Clear any walking paths of anything that might make someone trip, such as rocks or tools. Regularly check to see if handrails are loose or broken. Make sure that both sides of any steps have handrails. Any raised decks and porches should have guardrails on the edges. Have any leaves, snow, or ice cleared regularly. Use sand or salt on walking paths during winter. Clean up any spills in your garage right away. This includes oil or grease spills. What can I do in the bathroom? Use night lights. Install grab bars by the toilet and in the tub and shower. Do not use towel bars as grab bars. Use non-skid mats or decals in the tub or shower. If you need to sit down in the shower, use a plastic, non-slip stool. Keep the floor dry. Clean up any water that spills on the floor as soon as it happens. Remove soap buildup in the tub or shower regularly. Attach bath mats securely with double-sided non-slip rug tape. Do not have throw rugs and other things on the floor that can make you trip. What can I do in the bedroom? Use night lights. Make sure that you have a  light by your bed that is easy to reach. Do not use any sheets or blankets that are too big for your bed. They should not hang down onto the floor. Have a firm chair that has side arms. You can use this for support while you get dressed. Do not have throw rugs and other things on the floor that can make you trip. What can I do in the kitchen? Clean up any spills right away. Avoid walking on wet floors. Keep items that you use a lot in easy-to-reach places. If you need to reach something above you, use a strong step stool that has a grab bar. Keep electrical cords out of the way. Do not use floor polish or wax that makes floors slippery. If you must use wax, use non-skid floor wax. Do not have throw rugs and other things on the floor that can make you trip. What can I do  with my stairs? Do not leave any items on the stairs. Make sure that there are handrails on both sides of the stairs and use them. Fix handrails that are broken or loose. Make sure that handrails are as long as the stairways. Check any carpeting to make sure that it is firmly attached to the stairs. Fix any carpet that is loose or worn. Avoid having throw rugs at the top or bottom of the stairs. If you do have throw rugs, attach them to the floor with carpet tape. Make sure that you have a light switch at the top of the stairs and the bottom of the stairs. If you do not have them, ask someone to add them for you. What else can I do to help prevent falls? Wear shoes that: Do not have high heels. Have rubber bottoms. Are comfortable and fit you well. Are closed at the toe. Do not wear sandals. If you use a stepladder: Make sure that it is fully opened. Do not climb a closed stepladder. Make sure that both sides of the stepladder are locked into place. Ask someone to hold it for you, if possible. Clearly mark and make sure that you can see: Any grab bars or handrails. First and last steps. Where the edge of each step is. Use tools that help you move around (mobility aids) if they are needed. These include: Canes. Walkers. Scooters. Crutches. Turn on the lights when you go into a dark area. Replace any light bulbs as soon as they burn out. Set up your furniture so you have a clear path. Avoid moving your furniture around. If any of your floors are uneven, fix them. If there are any pets around you, be aware of where they are. Review your medicines with your doctor. Some medicines can make you feel dizzy. This can increase your chance of falling. Ask your doctor what other things that you can do to help prevent falls. This information is not intended to replace advice given to you by your health care provider. Make sure you discuss any questions you have with your health care  provider. Document Released: 02/17/2009 Document Revised: 09/29/2015 Document Reviewed: 05/28/2014 Elsevier Interactive Patient Education  2017 [employer].

## 2022-12-10 ENCOUNTER — Telehealth: Payer: Self-pay

## 2022-12-10 NOTE — Telephone Encounter (Signed)
Received referral for patient to schedule her colonoscopy.  During triage she stated that she was recently hospitalized 07/24 for blood clot. She just recently started Eliquis.    Informed patient that it is too soon for [place] to schedule her colonoscopy and we will defer it 6 months.  Thanks,  [place], [place]

## 2022-12-11 ENCOUNTER — Ambulatory Visit (INDEPENDENT_AMBULATORY_CARE_PROVIDER_SITE_OTHER): Payer: 59 | Admitting: Podiatry

## 2022-12-11 DIAGNOSIS — S92302A Fracture of unspecified metatarsal bone(s), left foot, initial encounter for closed fracture: Secondary | ICD-10-CM

## 2022-12-11 DIAGNOSIS — S9032XA Contusion of left foot, initial encounter: Secondary | ICD-10-CM

## 2022-12-11 NOTE — Progress Notes (Signed)
Subjective:  Patient ID: Erin Hayes, female    DOB: 09/10/1965,  MRN: [id number]  Chief Complaint  Patient presents with   Foot Pain    57 y.o. female presents with the above complaint.  Patient is for follow-up of surgical neck fracture/stress fracture.  She says doing better a little bit sore still.  She has not been walking as much on the foot.  Denies any other acute complaints   Review of Systems: Negative except as noted in the HPI. Denies N/V/F/Ch.  Past Medical History:  Diagnosis Date   Carpal tunnel syndrome of right wrist    Chronic combined systolic (congestive) and diastolic (congestive) heart failure (HCC)    a. 03/2017 Echo: EF 20-25%, diff HK, Gr2 DD; b. 09/2017 Echo: EF 20-25%, diff HK, Gr2 DD.   Diabetes (HCC)    GERD (gastroesophageal reflux disease)    HLD (hyperlipidemia)    Hypertension    NICM (nonischemic cardiomyopathy) (HCC)    a. 03/2017 Echo: EF 20-25%, diff HK, Gr2 DD, mild to mod MR, nl RV fxn;  b. 03/2017 Cath: mild nonobs dzs, EF 25%; c. 09/2017 Echo: EF 20-25%, diff HK. Gr2 DD. Mild MR. Mildly reduced RV fxn.   Pleural effusion, left    a. 03/2017 s/p thoracentesis-->300 ml withdrawn--transudative.   Pneumonia 02/19/2017    Current Outpatient Medications:    albuterol (VENTOLIN HFA) 108 (90 Base) MCG/ACT inhaler, 2 puff EVERY 4 HOURS (route: inhalation), Disp: , Rfl:    apixaban (ELIQUIS) 2.5 MG TABS tablet, Take 1 tablet (2.5 mg total) by mouth 2 (two) times daily., Disp: 60 tablet, Rfl: 3   carvedilol (COREG) 12.5 MG tablet, Take 1.5 tablets (18.75 mg total) by mouth 2 (two) times daily., Disp: 270 tablet, Rfl: 3   cetirizine (ZYRTEC) 10 MG tablet, Take 1 tablet (10 mg total) by mouth daily. (Patient not taking: Reported on 12/07/2022), Disp: 30 tablet, Rfl: 11   Crisaborole (EUCRISA) 2 % OINT, Apply to aa's BID., Disp: 100 g, Rfl: 3   Exenatide ER (BYDUREON BCISE) 2 MG/0.85ML AUIJ, Inject 2 mg into the skin once a week. (Patient not taking:  Reported on 12/07/2022), Disp: 4 pen, Rfl: 1   fluticasone (FLONASE) 50 MCG/ACT nasal spray, Place 2 sprays into both nostrils daily. Use for 4-6 weeks then stop and use seasonally or as needed., Disp: 16 g, Rfl: 3   furosemide (LASIX) 40 MG tablet, Take 1 tablet (40 mg total) by mouth as directed. Take Lasix 40 mg once daily. May take extra lasix after lunch with potassium sparingly for leg swelling, Disp: 90 tablet, Rfl: 0   HYDROcodone-acetaminophen (NORCO) 5-325 MG tablet, Take 1 tablet by mouth every 4 (four) hours as needed for moderate pain., Disp: 6 tablet, Rfl: 0   Hyoscyamine Sulfate SL 0.125 MG SUBL, hyoscyamine 0.125 mg sublingual tablet, Disp: , Rfl:    insulin glargine (LANTUS) 100 UNIT/ML Solostar Pen, Inject 12 Units into the skin at bedtime., Disp: 15 mL, Rfl: 1   Loperamide HCl 1 MG/7.5ML LIQD, Imodium A-D 1 mg/7.5 mL oral liquid, Disp: , Rfl:    methocarbamol (ROBAXIN) 500 MG tablet, Take 1 tablet (500 mg total) by mouth every 8 (eight) hours as needed for muscle spasms., Disp: 15 tablet, Rfl: 0   Multiple Vitamin (MULTIVITAMIN WITH MINERALS) TABS tablet, Take 1 tablet by mouth daily., Disp: , Rfl:    ondansetron (ZOFRAN-ODT) 4 MG disintegrating tablet, 1 tablet EVERY 8 HOURS (route: oral) (Patient not taking: Reported on 12/07/2022),  Disp: , Rfl:    potassium chloride SA (KLOR-CON M) 20 MEQ tablet, Take 1 tablet (20 mEq total) by mouth as directed. Take 1 tablet once daily and Take extra potassium if you take lasix after lunch., Disp: 90 tablet, Rfl: 3   rosuvastatin (CRESTOR) 20 MG tablet, Take 1 tablet (20 mg total) by mouth daily., Disp: 90 tablet, Rfl: 3   sacubitril-valsartan (ENTRESTO) 97-103 MG, Take 1 tablet by mouth 2 (two) times daily., Disp: 180 tablet, Rfl: 3   spironolactone (ALDACTONE) 25 MG tablet, Take 1 tablet (25 mg total) by mouth daily., Disp: 90 tablet, Rfl: 3   tacrolimus (PROTOPIC) 0.1 % ointment, Apply topically 2 (two) times daily., Disp: 100 g, Rfl: 2    triamcinolone cream (KENALOG) 0.1 %, Apply to aa's BID x 2 weeks. Then decrease use to BID 5d/wk., Disp: 453.6 g, Rfl: 1  Social History   Tobacco Use  Smoking Status Never  Smokeless Tobacco Never    No Known Allergies Objective:  There were no vitals filed for this visit. There is no height or weight on file to calculate BMI. Constitutional Well developed. Well nourished.  Vascular Dorsalis pedis pulses palpable bilaterally. Posterior tibial pulses palpable bilaterally. Capillary refill normal to all digits.  No cyanosis or clubbing noted. Pedal hair growth normal.  Neurologic Normal speech. Oriented to person, place, and time. Epicritic sensation to light touch grossly present bilaterally.  Dermatologic Nails well groomed and normal in appearance. No open wounds. No skin lesions.  Orthopedic: No further pain on palpation across the metatarsal head second through 5.  No further pain with range of motion of the digits.   Radiographs: 3 views discussed mature adult left foot: Fractures noted at left second through fifth surgical neck.  Minimally displaced likely due to stress.  Osteoporosis noted of the bone Assessment:   1. Contusion of left foot, initial encounter   2. Closed fracture of head of metatarsal, left, initial encounter     Plan:  Patient was evaluated and treated and all questions answered.  Left second through fifth nondisplaced metatarsal head fracture -All questions or concerns were discussed with the patient in extensive detail.   -Clinically doing better.  I encouraged her to start walking more without the cam boot.  She states understanding her pain is improving.  If any foot and ankle issues on future she will come back and see me. No follow-ups on file.   Left metatarsal 2 through 5 surgical neck stress fracture cam boot immobilization

## 2022-12-17 ENCOUNTER — Encounter: Payer: Self-pay | Admitting: Internal Medicine

## 2022-12-17 ENCOUNTER — Ambulatory Visit (INDEPENDENT_AMBULATORY_CARE_PROVIDER_SITE_OTHER): Payer: 59 | Admitting: Internal Medicine

## 2022-12-17 VITALS — BP 162/98 | HR 85 | Temp 95.9°F

## 2022-12-17 DIAGNOSIS — I5022 Chronic systolic (congestive) heart failure: Secondary | ICD-10-CM | POA: Diagnosis not present

## 2022-12-17 DIAGNOSIS — I82220 Acute embolism and thrombosis of inferior vena cava: Secondary | ICD-10-CM | POA: Diagnosis not present

## 2022-12-17 DIAGNOSIS — E119 Type 2 diabetes mellitus without complications: Secondary | ICD-10-CM

## 2022-12-17 DIAGNOSIS — Z794 Long term (current) use of insulin: Secondary | ICD-10-CM

## 2022-12-17 DIAGNOSIS — I82409 Acute embolism and thrombosis of unspecified deep veins of unspecified lower extremity: Secondary | ICD-10-CM | POA: Diagnosis not present

## 2022-12-17 DIAGNOSIS — I2489 Other forms of acute ischemic heart disease: Secondary | ICD-10-CM

## 2022-12-17 DIAGNOSIS — N179 Acute kidney failure, unspecified: Secondary | ICD-10-CM

## 2022-12-17 MED ORDER — GLIPIZIDE ER 5 MG PO TB24: 5.0000 mg | ORAL_TABLET | Freq: Every day | ORAL | 0 refills | Status: DC

## 2022-12-17 NOTE — Progress Notes (Signed)
Subjective:    Patient ID: Erin Hayes, female    DOB: 1965/07/07, 57 y.o.   MRN: [id number]  HPI  Patient presents to clinic today for hosptial follow-up.  She presented to the ER 7/24 with complaint of weakness and abdominal pain.  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 Additionally, there is thrombus extension into the partially occluded right renal vein. Absence of collateral vascularization and central position of the thrombus favors an acute/subacute chronicity. Wedge-shaped focal hypoenhancement within the posterior medial spleen which may represent splenic infarct. Moderate sized partially imaged pericardial effusion. Consider further evaluation with echocardiography. Circumferential bladder wall thickening with perivesicular stranding which may be secondary to venous congestion in the setting of IVC thrombus, although cystitis may have a similar appearance. Correlate clinically.   CTA of the chest did not show any evidence of PE.  She was started on a heparin drip which was subsequently transitioned to Eliquis.  Hematology was consulted and they recommend monoclonal gammopathy workup and outpatient hypercoagulable workup.  Echocardiogram showed an EF of 25%, decreased contractile function involving the septum, 1.6 cm aneurysmal segment in the mid left ventricular septum without clear evidence of full ventricular septal defect, small circumferential pericardial effusion without tamponade physiology, and intrapulmonary shunt. No anginal symptoms, troponins peaked at 2.3K, BNP 84, and EKG with age-indeterminate anterior infarct. Underwent Myocardial perfusion study for risk stratification of NSTEMI, which showed a low-risk study and no significant ischemia noted. She was started on Lipitor 80 mg this admission.   Nephrology was consulted secondary to AKI.  They recommended she hold her losartan and spironolactone but restart them on discharge.  She was discharged on 7/30.  Since that time, she reports she is feeling better. She is sleeping well at night. Appetite is good. She denies abdominal pain, chest pain or shortness of breath. She is not checking her blood sugars.  She is stopped her insulin and Entresto as prescribed.  She has a follow-up appoint with cardiology tomorrow.  She reports she was not referred to hematology.  Review of Systems     Past Medical History:  Diagnosis Date   Carpal tunnel syndrome of right wrist    Chronic combined systolic (congestive) and diastolic (congestive) heart failure (HCC)    a. 03/2017 Echo: EF 20-25%, diff HK, Gr2 DD; b. 09/2017 Echo: EF 20-25%, diff HK, Gr2 DD.   Diabetes (HCC)    GERD (gastroesophageal reflux disease)    HLD (hyperlipidemia)    Hypertension    NICM (nonischemic cardiomyopathy) (HCC)    a. 03/2017 Echo: EF 20-25%, diff HK, Gr2 DD, mild to mod MR, nl RV fxn;  b. 03/2017 Cath: mild nonobs dzs, EF 25%; c. 09/2017 Echo: EF 20-25%, diff HK. Gr2 DD. Mild MR. Mildly reduced RV fxn.   Pleural effusion, left    a. 03/2017 s/p thoracentesis-->300 ml withdrawn--transudative.   Pneumonia 02/19/2017    Current Outpatient Medications  Medication Sig Dispense Refill   albuterol (VENTOLIN HFA) 108 (90 Base) MCG/ACT inhaler 2 puff EVERY 4 HOURS (route: inhalation)     apixaban (ELIQUIS) 2.5 MG TABS tablet Take 1 tablet (2.5 mg total) by mouth 2 (two) times daily. 60 tablet 3   carvedilol (COREG) 12.5 MG tablet Take 1.5 tablets (18.75 mg total) by mouth 2 (two) times daily. 270 tablet 3   cetirizine (ZYRTEC) 10 MG tablet  Take 1 tablet (10 mg total) by mouth daily. (Patient not taking: Reported on 12/07/2022) 30 tablet 11   Crisaborole (EUCRISA) 2 % OINT Apply to aa's BID. 100 g 3   Exenatide ER (BYDUREON BCISE) 2 MG/0.85ML AUIJ Inject 2 mg into the skin  once a week. (Patient not taking: Reported on 12/07/2022) 4 pen 1   fluticasone (FLONASE) 50 MCG/ACT nasal spray Place 2 sprays into both nostrils daily. Use for 4-6 weeks then stop and use seasonally or as needed. 16 g 3   furosemide (LASIX) 40 MG tablet Take 1 tablet (40 mg total) by mouth as directed. Take Lasix 40 mg once daily. May take extra lasix after lunch with potassium sparingly for leg swelling 90 tablet 0   HYDROcodone-acetaminophen (NORCO) 5-325 MG tablet Take 1 tablet by mouth every 4 (four) hours as needed for moderate pain. 6 tablet 0   Hyoscyamine Sulfate SL 0.125 MG SUBL hyoscyamine 0.125 mg sublingual tablet     insulin glargine (LANTUS) 100 UNIT/ML Solostar Pen Inject 12 Units into the skin at bedtime. 15 mL 1   Loperamide HCl 1 MG/7.5ML LIQD Imodium A-D 1 mg/7.5 mL oral liquid     methocarbamol (ROBAXIN) 500 MG tablet Take 1 tablet (500 mg total) by mouth every 8 (eight) hours as needed for muscle spasms. 15 tablet 0   Multiple Vitamin (MULTIVITAMIN WITH MINERALS) TABS tablet Take 1 tablet by mouth daily.     ondansetron (ZOFRAN-ODT) 4 MG disintegrating tablet 1 tablet EVERY 8 HOURS (route: oral) (Patient not taking: Reported on 12/07/2022)     potassium chloride SA (KLOR-CON M) 20 MEQ tablet Take 1 tablet (20 mEq total) by mouth as directed. Take 1 tablet once daily and Take extra potassium if you take lasix after lunch. 90 tablet 3   rosuvastatin (CRESTOR) 20 MG tablet Take 1 tablet (20 mg total) by mouth daily. 90 tablet 3   sacubitril-valsartan (ENTRESTO) 97-103 MG Take 1 tablet by mouth 2 (two) times daily. 180 tablet 3   spironolactone (ALDACTONE) 25 MG tablet Take 1 tablet (25 mg total) by mouth daily. 90 tablet 3   tacrolimus (PROTOPIC) 0.1 % ointment Apply topically 2 (two) times daily. 100 g 2   triamcinolone cream (KENALOG) 0.1 % Apply to aa's BID x 2 weeks. Then decrease use to BID 5d/wk. 453.6 g 1   No current facility-administered medications for this visit.    No  Known Allergies  Family History  Problem Relation Age of Onset   Lung cancer Mother    Hypertension Father    CAD Father        a. MI age 49   Heart disease Father    COPD Neg Hx    Diabetes Mellitus II Neg Hx     Social History   Socioeconomic History   Marital status: Single    Spouse name: Not on file   Number of children: 3   Years of education: 12   Highest education level: 12th grade  Occupational History   Occupation: Arts development officer  Tobacco Use   Smoking status: Never   Smokeless tobacco: Never  Vaping Use   Vaping status: Never Used  Substance and Sexual Activity   Alcohol use: No   Drug use: No   Sexual activity: Never  Other Topics Concern   Not on file  Social History Narrative   Not on file   Social Determinants of Health   Financial Resource Strain: Low Risk  (12/07/2022)  Overall Financial Resource Strain (CARDIA)    Difficulty of Paying Living Expenses: Not hard at all  Food Insecurity: No Food Insecurity (12/07/2022)   Hunger Vital Sign    Worried About Running Out of Food in the Last Year: Never true    Ran Out of Food in the Last Year: Never true  Transportation Needs: No Transportation Needs (12/07/2022)   PRAPARE - Administrator, Civil Service (Medical): No    Lack of Transportation (Non-Medical): No  Physical Activity: Unknown (12/07/2022)   Exercise Vital Sign    Days of Exercise per Week: 2 days    Minutes of Exercise per Session: Not on file  Stress: No Stress Concern Present (12/07/2022)   [employer] of Occupational Health - Occupational Stress Questionnaire    Feeling of Stress : Not at all  Social Connections: Moderately Isolated (12/07/2022)   Social Connection and Isolation Panel [NHANES]    Frequency of Communication with Friends and Family: More than three times a week    Frequency of Social Gatherings with Friends and Family: More than three times a week    Attends Religious Services: More than 4 times per year     Active Member of [employer] or Organizations: No    Attends Banker Meetings: Never    Marital Status: Divorced  Catering manager Violence: Not At Risk (12/07/2022)   Humiliation, Afraid, Rape, and Kick questionnaire    Fear of Current or Ex-Partner: No    Emotionally Abused: No    Physically Abused: No    Sexually Abused: No     Constitutional: Denies fever, malaise, fatigue, headache or abrupt weight changes.  HEENT: Denies eye pain, eye redness, ear pain, ringing in the ears, wax buildup, runny nose, nasal congestion, bloody nose, or sore throat. Respiratory: Denies difficulty breathing, shortness of breath, cough or sputum production.   Cardiovascular: Patient reports swelling of left lower extremity.  Denies chest pain, chest tightness, palpitations or swelling in the hands.  Gastrointestinal: Denies abdominal pain, bloating, constipation, diarrhea or blood in the stool.  GU: Denies urgency, frequency, pain with urination, burning sensation, blood in urine, odor or discharge. Musculoskeletal: Patient reports left foot pain, difficulty with gait.  Denies decrease in range of motion, muscle pain or joint pain and swelling.  Skin: Denies redness, rashes, lesions or ulcercations.  Neurological: Denies dizziness, difficulty with memory, difficulty with speech or problems with balance and coordination.  Psych: Denies anxiety, depression, SI/HI.  No other specific complaints in a complete review of systems (except as listed in HPI above).  Objective:   Physical Exam   BP (!) 162/98 (BP Location: Right Arm, Patient Position: Sitting, Cuff Size: Normal)   Pulse 85   Temp (!) 95.9 F (35.5 C) (Temporal)   SpO2 98%   Wt Readings from Last 3 Encounters:  07/10/22 119 lb (54 kg)  07/03/22 119 lb (54 kg)  06/26/22 117 lb (53.1 kg)    General: Appears her stated age, chronic ill-appearing, in NAD. Skin: Bruising noted to BUE.  Scabbed lesions noted to left lower  extremity. HEENT: Head: normal shape and size; Eyes: sclera white, no icterus, conjunctiva pink, PERRLA and EOMs intact;  Cardiovascular: Normal rate and rhythm. S1,S2 noted.  Murmur noted. No JVD.  1+ pitting LLE edema. No carotid bruits noted. Pulmonary/Chest: Normal effort and positive vesicular breath sounds. No respiratory distress. No wheezes, rales or ronchi noted.  Abdomen: Soft and nontender. Normal bowel sounds.  Musculoskeletal: In wheelchair  today. Neurological: Alert and oriented.    BMET    Component Value Date/Time   NA 140 04/25/2022 1608   NA 142 06/19/2018 1905   NA 129 (L) 04/25/2013 2004   K 3.1 (L) 04/25/2022 1608   K 5.7 (H) 04/25/2013 2004   CL 105 04/25/2022 1608   CL 97 (L) 04/25/2013 2004   CO2 23 04/25/2022 1608   CO2 12 (L) 04/25/2013 2004   GLUCOSE 193 (H) 04/25/2022 1608   GLUCOSE 370 (H) 04/25/2013 2004   BUN 16 04/25/2022 1608   BUN 14 06/19/2018 1905   BUN 17 04/25/2013 2004   CREATININE 0.85 04/25/2022 1608   CREATININE 0.89 01/19/2022 1105   CALCIUM 8.3 (L) 04/25/2022 1608   CALCIUM 9.7 04/25/2013 2004   GFRNONAA >60 04/25/2022 1608   GFRNONAA 66 03/28/2020 1044   GFRAA 77 03/28/2020 1044    Lipid Panel     Component Value Date/Time   CHOL 241 (H) 01/19/2022 1105   CHOL 216 (H) 06/19/2018 1905   TRIG 135 01/19/2022 1105   HDL 65 01/19/2022 1105   HDL 53 06/19/2018 1905   CHOLHDL 3.7 01/19/2022 1105   LDLCALC 150 (H) 01/19/2022 1105    CBC    Component Value Date/Time   WBC 9.9 04/25/2022 1608   RBC 4.80 04/25/2022 1608   HGB 13.1 04/25/2022 1608   HGB 11.6 06/19/2018 1905   HCT 40.9 04/25/2022 1608   HCT 35.4 06/19/2018 1905   PLT 352 04/25/2022 1608   PLT 435 06/19/2018 1905   MCV 85.2 04/25/2022 1608   MCV 85 06/19/2018 1905   MCV 88 04/25/2013 2004   MCH 27.3 04/25/2022 1608   MCHC 32.0 04/25/2022 1608   RDW 12.9 04/25/2022 1608   RDW 12.4 06/19/2018 1905   RDW 12.3 04/25/2013 2004   LYMPHSABS 2.0 04/25/2022 1608    MONOABS 0.5 04/25/2022 1608   EOSABS 0.1 04/25/2022 1608   BASOSABS 0.1 04/25/2022 1608    Hgb A1C Lab Results  Component Value Date   HGBA1C 7.4 (H) 01/19/2022           Assessment & Plan:  Hospital follow-up for IVC thrombosis, recurrent DVT, CHF annual exam, demand ischemia, AKI, DM 2:  Hospital notes, labs and imaging reviewed She will continue current meds as previously prescribed Rx for glipizide 5 mg XL daily in place of insulin, last A1c 6.6 Will send in meter, strips and lancets She will follow-up with hematology as an outpatient-referral placed She plans to follow-up with cardiology tomorrow and they will likely restart Entresto at that time   Schedule an appointment for your annual exam Nicki Reaper, NP
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
## 2022-12-17 NOTE — Patient Instructions (Signed)
Deep Vein Thrombosis  Deep vein thrombosis (DVT) is a condition in which a blood clot forms in a vein of the deep venous system. This can occur in the lower leg, thigh, pelvis, arm, or neck. A clot is blood that has thickened into a gel or solid. This condition is serious and can be life-threatening if the clot travels to the arteries of the lungs and causes a blockage (pulmonary embolism). A DVT can also damage veins in the leg, which can lead to long-term venous disease, leg pain, swelling, discoloration, and ulcers or sores (post-thrombotic syndrome). What are the causes? This condition may be caused by: A slowdown of blood flow. Damage to a vein. A condition that causes blood to clot more easily, such as certain bleeding disorders. What increases the risk? The following factors may make you more likely to develop this condition: Obesity. Being older, especially older than age 60. Being inactive or not moving around (sedentary lifestyle). This may include: Sitting or lying down for longer than 4-6 hours other than to sleep at night. Being in the hospital, or having major or lengthy surgery. Having any recent bone injuries, such as breaks (fractures), that reduce movement, especially in the lower extremities. Having recent orthopedic surgery on the lower extremities. Being pregnant, giving birth, or having recently given birth. Taking medicines that contain estrogen, such as birth control or hormone replacement therapy. Using products that contain nicotine or tobacco, especially if you use hormonal birth control. Having a history of a blood vessel disease (peripheral vascular disease) or congestive heart disease. Having a history of cancer, especially if being treated with chemotherapy. What are the signs or symptoms? Symptoms of this condition include: Swelling, pain, pressure, or tenderness in an arm or a leg. An arm or a leg becoming warm, red, or discolored. A leg turning very pale or  blue. You may have a large DVT. This is rare. If the clot is in your leg, you may notice that symptoms get worse when you stand or walk. In some cases, there are no symptoms. How is this diagnosed? This condition is diagnosed with: Your medical history and a physical exam. Tests, such as: Blood tests to check how well your blood clots. Doppler ultrasound. This is the best way to find a DVT. CT venogram. Contrast dye is injected into a vein, and X-rays are taken to check for clots. This is helpful for veins in the chest or pelvis. How is this treated? Treatment for this condition depends on: The cause of your DVT. The size and location of your DVT, or having more than one DVT. Your risk for bleeding or developing more clots. Other medical conditions you may have. Treatment may include: Taking a blood thinner medicine (anticoagulant) to prevent more clots from forming or current clots from growing. Wearing compression stockings. Injecting medicines into the affected vein to break up the clot (catheter-directed thrombolysis). Surgical procedures, when DVT is severe or hard to treat. These may be done to: Isolate and remove your clot. Place an inferior vena cava (IVC) filter. This filter is placed into a large vein called the inferior vena cava to catch blood clots before they reach your lungs. You may get some medical treatments for 6 months or longer. Follow these instructions at home: If you are taking blood thinners: Talk with your health care provider before you take any medicines that contain aspirin or NSAIDs, such as ibuprofen. These medicines increase your risk for dangerous bleeding. Take your medicine exactly   as told, at the same time every day. Do not skip a dose. Do not take more than the prescribed dose. This is important. Ask your health care provider about foods and medicines that could change or interact with the way your blood thinner works. Avoid these foods and medicines  if you are told to do so. Avoid anything that may cause bleeding or bruising. You may bleed more easily while taking blood thinners. Be very careful when using knives, scissors, or other sharp objects. Use an electric razor instead of a blade. Avoid activities that could cause injury or bruising, and follow instructions for preventing falls. Tell your health care provider if you have had any internal bleeding, bleeding ulcers, or neurologic diseases, such as strokes or cerebral aneurysms. Wear a medical alert bracelet or carry a card that lists what medicines you take. General instructions Take over-the-counter and prescription medicines only as told by your health care provider. Return to your normal activities as told by your health care provider. Ask your health care provider what activities are safe for you. If recommended, wear compression stockings as told by your health care provider. These stockings help to prevent blood clots and reduce swelling in your legs. Never wear your compression stockings while sleeping at night. Keep all follow-up visits. This is important. Where to find more information American Heart Association: www.heart.org Centers for Disease Control and Prevention: www.cdc.gov National Heart, Lung, and Blood Institute: www.nhlbi.nih.gov Contact a health care provider if: You miss a dose of your blood thinner. You have unusual bruising or other color changes. You have new or worse pain, swelling, or redness in an arm or a leg. You have worsening numbness or tingling in an arm or a leg. You have a significant color change (pale or blue) in the extremity that has the DVT. Get help right away if: You have signs or symptoms that a blood clot has moved to the lungs. These may include: Shortness of breath. Chest pain. Fast or irregular heartbeats (palpitations). Light-headedness, dizziness, or fainting. Coughing up blood. You have signs or symptoms that your blood is  too thin. These may include: Blood in your vomit, stool, or urine. A cut that will not stop bleeding. A menstrual period that is heavier than usual. A severe headache or confusion. These symptoms may be an emergency. Get help right away. Call 911. Do not wait to see if the symptoms will go away. Do not drive yourself to the hospital. Summary Deep vein thrombosis (DVT) happens when a blood clot forms in a deep vein. This may occur in the lower leg, thigh, pelvis, arm, or neck. Symptoms affect the arm or leg and can include swelling, pain, tenderness, warmth, redness, or discoloration. This condition may be treated with medicines. In severe cases, a procedure or surgery may be done to remove or dissolve the clots. If you are taking blood thinners, take them exactly as told. Do not skip a dose. Do not take more than is prescribed. Get help right away if you have a severe headache, shortness of breath, chest pain, fast or irregular heartbeats, or blood in your vomit, urine, or stool. This information is not intended to replace advice given to you by your health care provider. Make sure you discuss any questions you have with your health care provider. Document Revised: 11/14/2020 Document Reviewed: 11/14/2020 Elsevier Patient Education  2024 Elsevier Inc.  

## 2022-12-18 ENCOUNTER — Ambulatory Visit: Payer: 59 | Admitting: Cardiovascular Disease

## 2022-12-28 ENCOUNTER — Inpatient Hospital Stay: Payer: 59

## 2022-12-28 ENCOUNTER — Inpatient Hospital Stay: Payer: 59 | Admitting: Oncology

## 2023-01-01 ENCOUNTER — Inpatient Hospital Stay: Payer: 59

## 2023-01-01 ENCOUNTER — Inpatient Hospital Stay: Payer: 59 | Attending: Oncology | Admitting: Oncology

## 2023-01-09 NOTE — Progress Notes (Deleted)
Cardiology Office Note    Date:  01/09/2023   ID:  Erin Hayes, DOB 1966/05/04, MRN [id number]  PCP:  Lorre Munroe, NP  Cardiologist:  Julien Nordmann, MD  Electrophysiologist:  None   Chief Complaint: Hospital follow-up  History of Present Illness:   Erin Hayes is a 57 y.o. 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  Was admitted to the hospital in 2018 with pneumonia.  A month later, she was readmitted with dyspnea and lower extremity edema.  Echo at that time showed an EF of 20 to 25% with grade 2 diastolic dysfunction.  LHC showed minimal nonobstructive CAD.  Follow-up echo in 09/2017 continue to show LV dysfunction with an EF of 20 to 25%.  Echo in 2021 showed an improvement in her LV systolic function with an EF of 45 to 50%, hypokinesis of the anteroseptal wall and the basal anterior wall, mildly increased LVH, grade 1 diastolic dysfunction, and normal RV systolic function and ventricular cavity size.  She was last seen in our office in 07/2019 with recommendation to take Lasix 40 mg daily with a as needed Lasix for lower extremity swelling.  At that time, apixaban was refilled at 2.5 mg twice daily as needed for prophylaxis.  She was admitted to [facility] in 11/2022 after presenting with weakness and abdominal pain and was found to have a partially occlusive thrombus extending into the infrahepatic IVC from the bilateral common femoral veins and involving the bilateral internal, external, and common iliac veins as well as thrombus extension into a partially occluded right renal vein.  There was also which shaped focal hypoenhancement within the posterior medial spleen.  She was evaluated by hematology  during the admission with recommendation for monoclonal gammopathy workup and outpatient hypercoagulability evaluation.  Lifelong anticoagulation was recommended.  CTA of the chest showed no evidence of PE.  Admission was complicated by contrast-induced nephropathy with AKI with serum creatinine peaking at 2.3 (baseline around 0.9-1.0).  The IVC DVT extending into the renal vein was not felt to be contributory to AKI.  There was no occlusive renal arterial disease.  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  Initial and peak troponin 2308.  BNP 84.  EKG with concern for age-indeterminate anterior MI.  Echo showed an EF of 25%, akinesis/dyskinesis involving the basal, mid, and apical septum and anteroseptal segments, marked thinning and akinesis of the mid LV septum/anteroseptal segment without evidence of ventricular septal defect, normal RV size with mildly reduced systolic function, small circumferential pericardial effusion that was mostly lateral/posterior to the LV without evidence of tamponade physiology, and agitated saline study consistent with small intrapulmonary shunt.  Lexiscan MPI showed a small, mild in severity, fixed perfusion defect involving the mid anteroseptal and mid inferoseptal segments felt to likely be due to underlying LBBB and less likely scar.  LV systolic function was read as normal with a poststress EF of greater than 60%.  Small to moderate pericardial effusion was noted.  No significant coronary artery calcifications were noted on attenuated CT imaging.  No significant ischemia noted.  Overall, this was a low risk study.  During  the admission GDMT was held.  At time of discharge, she was sent home with XL 25 mg daily, hydralazine 25 mg 3 times daily, and atorvastatin 80 mg.  ***   Labs independently reviewed: 11/2022 - Hgb 11.7, PLT 374,  magnesium 2.4, potassium 4.8, BUN 31, serum creatinine 2.01, albumin 3.4, AST/ALT normal, TC 240, TG 130, HDL 51, LDL 166, TSH normal, A1c 6.6  Past Medical History:  Diagnosis Date   Carpal tunnel syndrome of right wrist    Chronic combined systolic (congestive) and diastolic (congestive) heart failure (HCC)    a. 03/2017 Echo: EF 20-25%, diff HK, Gr2 DD; b. 09/2017 Echo: EF 20-25%, diff HK, Gr2 DD.   Diabetes (HCC)    GERD (gastroesophageal reflux disease)    HLD (hyperlipidemia)    Hypertension    NICM (nonischemic cardiomyopathy) (HCC)    a. 03/2017 Echo: EF 20-25%, diff HK, Gr2 DD, mild to mod MR, nl RV fxn;  b. 03/2017 Cath: mild nonobs dzs, EF 25%; c. 09/2017 Echo: EF 20-25%, diff HK. Gr2 DD. Mild MR. Mildly reduced RV fxn.   Pleural effusion, left    a. 03/2017 s/p thoracentesis-->300 ml withdrawn--transudative.   Pneumonia 02/19/2017    Past Surgical History:  Procedure Laterality Date   COLONOSCOPY WITH PROPOFOL N/A 01/21/2017   Procedure: COLONOSCOPY WITH PROPOFOL;  Surgeon: Wyline Mood, MD;  Location[facility] ENDOSCOPY;  Service: Gastroenterology;  Laterality: N/A;   ESOPHAGOGASTRODUODENOSCOPY (EGD) WITH PROPOFOL N/A 01/21/2017   Procedure: ESOPHAGOGASTRODUODENOSCOPY (EGD) WITH PROPOFOL;  Surgeon: Wyline Mood, MD;  Location: [facility] ENDOSCOPY;  Service: Gastroenterology;  Laterality: N/A;   FLEXIBLE SIGMOIDOSCOPY N/A 11/04/2016   Procedure: FLEXIBLE SIGMOIDOSCOPY;  Surgeon: Charlott Rakes, MD;  Location: [facility] ENDOSCOPY;  Service: Endoscopy;  Laterality: N/A;   HIP PINNING,CANNULATED Left 06/19/2021   Procedure: CANNULATED HIP PINNING;  Surgeon: Ross Marcus, MD;  Location: [facility] ORS;  Service: Orthopedics;  Laterality: Left;   LEFT HEART CATH AND CORONARY ANGIOGRAPHY N/A 04/05/2017   Procedure: LEFT HEART CATH AND CORONARY ANGIOGRAPHY;  Surgeon: Iran Ouch, MD;  Location: [facility] INVASIVE CV LAB;  Service: Cardiovascular;  Laterality: N/A;   LITHOTRIPSY      Current  Medications: No outpatient medications have been marked as taking for the 01/10/23 encounter (Appointment) with Sondra Barges, PA-C.    Allergies:   Patient has no known allergies.   Social History   Socioeconomic History   Marital status: Single    Spouse name: Not on file   Number of children: 3   Years of education: 12   Highest education level: 12th grade  Occupational History   Occupation: Arts development officer  Tobacco Use   Smoking status: Never   Smokeless tobacco: Never  Vaping Use   Vaping status: Never Used  Substance and Sexual Activity   Alcohol use: No   Drug use: No   Sexual activity: Never  Other Topics Concern   Not on file  Social History Narrative   Not on file   Social Determinants of Health   Financial Resource Strain: Low Risk  (12/07/2022)   Overall Financial Resource Strain (CARDIA)    Difficulty of Paying Living Expenses: Not hard at all  Food Insecurity: No Food Insecurity (12/07/2022)   Hunger Vital Sign    Worried About Running Out of Food in the Last Year: Never true    Ran Out of Food in the Last Year: Never true  Transportation Needs: No Transportation Needs (12/07/2022)   PRAPARE - Transportation  Lack of Transportation (Medical): No    Lack of Transportation (Non-Medical): No  Physical Activity: Unknown (12/07/2022)   Exercise Vital Sign    Days of Exercise per Week: 2 days    Minutes of Exercise per Session: Not on file  Stress: No Stress Concern Present (12/07/2022)   [employer] of Occupational Health - Occupational Stress Questionnaire    Feeling of Stress : Not at all  Social Connections: Moderately Isolated (12/07/2022)   Social Connection and Isolation Panel [NHANES]    Frequency of Communication with Friends and Family: More than three times a week    Frequency of Social Gatherings with Friends and Family: More than three times a week    Attends Religious Services: More than 4 times per year    Active Member of [employer] or Organizations: No     Attends Banker Meetings: Never    Marital Status: Divorced     Family History:  The patient's family history includes CAD in her father; Heart disease in her father; Hypertension in her father; Lung cancer in her mother. There is no history of COPD or Diabetes Mellitus II.  ROS:   12-point review of systems is negative unless otherwise noted in the HPI.   EKGs/Labs/Other Studies Reviewed:    Studies reviewed were summarized above. The additional studies were reviewed today:  2D echo 04/04/2017: - Left ventricle: The cavity size was normal. There was moderate    concentric hypertrophy. Systolic function was severely reduced.    The estimated ejection fraction was in the range of 20% to 25%.    Diffuse hypokinesis. Regional wall motion abnormalities cannot be    excluded. Features are consistent with a pseudonormal left    ventricular filling pattern, with concomitant abnormal relaxation    and increased filling pressure (grade 2 diastolic dysfunction).  - Mitral valve: There was mild to moderate regurgitation.  - Right ventricle: Systolic function was normal.  - Pericardium, extracardiac: A small pericardial effusion was    identified, circumferential.   Impressions:   - Left pleural effusion noted. Unable to exclude mass in the    pleural space measuring 7.5 x 4.5 cm.  __________  [facility] 04/05/2017: There is severe left ventricular systolic dysfunction. LV end diastolic pressure is severely elevated. The left ventricular ejection fraction is less than 25% by visual estimate.   1.  Mild nonobstructive coronary artery disease. 2.  Severely reduced LV systolic function with an ejection fraction of 15-20% with global hypokinesis. 3.  Severely elevated left ventricular end-diastolic pressure at 33 mmHg.   Recommendations: The patient has nonischemic cardiomyopathy with severely reduced LV systolic function.  She continues to be significantly volume overloaded  based on left ventricular end-diastolic pressure.  I recommend continuing IV diuresis for another 1 or 2 days.  Continue optimal medical therapy for heart failure. __________  2D echo 09/25/2017: - Left ventricle: The cavity size was normal. Systolic function was    severely reduced. The estimated ejection fraction was in the    range of 20% to 25%. Diffuse hypokinesis Regional wall motion    abnormalities cannot be excluded. Features are consistent with a    pseudonormal left ventricular filling pattern, with concomitant    abnormal relaxation and increased filling pressure (grade 2    diastolic dysfunction).  - Mitral valve: There was mild regurgitation.  - Left atrium: The atrium was normal in size.  - Right ventricle: Systolic function was mildly reduced.  - Pulmonary arteries: Systolic  pressure was within the normal    range.  - Pericardium, extracardiac: A small pericardial effusion was    identified.  __________  2D echo 05/21/2019: 1. Left ventricular ejection fraction, by visual estimation, is 45 to  50%. The left ventricle has normal function. There is mildly increased  left ventricular hypertrophy.   2. Left ventricular diastolic parameters are consistent with Grade I  diastolic dysfunction (impaired relaxation).   3. The left ventricle demonstrates hypokinesis of the anteroseptal wall,  basal anterior wall   4. Global right ventricle has normal systolic function.The right  ventricular size is normal. No increase in right ventricular wall  thickness.   5. Left atrial size was normal.   6. TR signal is inadequate for assessing pulmonary artery systolic  pressure.  __________  2D echo 11/29/2022 [facility]): Summary   1. The left ventricle is normal in size with normal wall thickness.    2. The left ventricular systolic function is severely decreased, LVEF is  visually estimated at 25%.    3. There is decreased contractile function (akinesis/dyskinesis) involving  the basal,  mid and apical septum and anteroseptal segment(s).    4. There is marked thinning and akinesis of the mid left ventricular  septum/anteroseptal segment(s) (1.6 cm in length) without evidence of  ventricular septal defect by color Doppler examination or saline contrast  bubble study.    5. The right ventricle is normal in size, with mildly reduced systolic  function.   6. There is a small, circumferential pericardial effusion which is mostly  lateral/posterior to the left ventricle.    7. There is no echocardiographic evidence of tamponade physiology.    8. Agitated saline study is consistent with intrapulmonary shunt (late  bubble crossing, small/grade I, < 30 bubbles in LV).  __________  Nuclear stress test 12/03/2022 [facility]): - Low risk study  - No significant ischemia noted  - There is a small, mild in severity, fixed perfusion defect involving the  mid anteroseptal and mid inferoseptal segments. This is likely due to LBBB  and less likely due to scar.  - Left ventricular systolic function is normal. Post stress the ejection  fraction is > 60%.  - No significant coronary calcifications were noted on the attenuation CT  - A small to moderate (mostly posteriorly 1.4 cm) pericardial effusion is  noted    EKG:  EKG is ordered today.  The EKG ordered today demonstrates ***  Recent Labs: 04/25/2022: ALT 15; B Natriuretic Peptide 653.8; BUN 16; Creatinine, Ser 0.85; Hemoglobin 13.1; Platelets 352; Potassium 3.1; Sodium 140  Recent Lipid Panel    Component Value Date/Time   CHOL 241 (H) 01/19/2022 1105   CHOL 216 (H) 06/19/2018 1905   TRIG 135 01/19/2022 1105   HDL 65 01/19/2022 1105   HDL 53 06/19/2018 1905   CHOLHDL 3.7 01/19/2022 1105   LDLCALC 150 (H) 01/19/2022 1105    PHYSICAL EXAM:    VS:  There were no vitals taken for this visit.  BMI: There is no height or weight on file to calculate BMI.  Physical Exam  Wt Readings from Last 3 Encounters:  07/10/22 119 lb (54 kg)   07/03/22 119 lb (54 kg)  06/26/22 117 lb (53.1 kg)     ASSESSMENT & PLAN:   Chronic combined systolic and diastolic CHF secondary to NICM:  High-sensitivity troponin:  HTN: Blood pressure  HLD: LDL 166.  Recurrent DVT:  AKI: Discharge serum creatinine 2.0 in 11/2022 with a baseline  around 0.9-1.0.   {Are you ordering a CV Procedure (e.g. stress test, cath, DCCV, TEE, etc)?   Press F2        :[id number]}     Disposition: F/u with Dr. Mariah Milling or an APP in ***.   Medication Adjustments/Labs and Tests Ordered: Current medicines are reviewed at length with the patient today.  Concerns regarding medicines are outlined above. Medication changes, Labs and Tests ordered today are summarized above and listed in the Patient Instructions accessible in Encounters.   Signed, Eula Listen, PA-C 01/09/2023 2:20 PM     [facility] [place] [address] [place], [place] [address] [phone]

## 2023-01-10 ENCOUNTER — Ambulatory Visit: Payer: 59 | Attending: Cardiovascular Disease | Admitting: Physician Assistant

## 2023-01-11 ENCOUNTER — Encounter: Payer: Self-pay | Admitting: Pharmacist

## 2023-01-11 ENCOUNTER — Encounter: Payer: Self-pay | Admitting: Physician Assistant

## 2023-01-21 ENCOUNTER — Ambulatory Visit: Payer: 59 | Admitting: Internal Medicine

## 2023-01-21 NOTE — Progress Notes (Deleted)
Subjective:    Patient ID: Erin Hayes, female    DOB: 11/21/1965, 57 y.o.   MRN: [id number]  HPI  Patient presents to clinic today for her annual exam.  Flu: 03/2020 Tetanus: COVID: X 2 Pneumovax: 09/2020 Shingrix: Never Pap smear: >5 years ago Mammogram: >2 years ago Bone density: Never Colon screening: 01/2017 Vision screening: Dentist:  Diet: Exercise:   Review of Systems     Past Medical History:  Diagnosis Date   Carpal tunnel syndrome of right wrist    Chronic combined systolic (congestive) and diastolic (congestive) heart failure (HCC)    a. 03/2017 Echo: EF 20-25%, diff HK, Gr2 DD; b. 09/2017 Echo: EF 20-25%, diff HK, Gr2 DD.   Diabetes (HCC)    GERD (gastroesophageal reflux disease)    HLD (hyperlipidemia)    Hypertension    NICM (nonischemic cardiomyopathy) (HCC)    a. 03/2017 Echo: EF 20-25%, diff HK, Gr2 DD, mild to mod MR, nl RV fxn;  b. 03/2017 Cath: mild nonobs dzs, EF 25%; c. 09/2017 Echo: EF 20-25%, diff HK. Gr2 DD. Mild MR. Mildly reduced RV fxn.   Pleural effusion, left    a. 03/2017 s/p thoracentesis-->300 ml withdrawn--transudative.   Pneumonia 02/19/2017    Current Outpatient Medications  Medication Sig Dispense Refill   albuterol (VENTOLIN HFA) 108 (90 Base) MCG/ACT inhaler 2 puff EVERY 4 HOURS (route: inhalation)     apixaban (ELIQUIS) 2.5 MG TABS tablet Take 1 tablet (2.5 mg total) by mouth 2 (two) times daily. 60 tablet 3   carvedilol (COREG) 12.5 MG tablet Take 1.5 tablets (18.75 mg total) by mouth 2 (two) times daily. 270 tablet 3   cetirizine (ZYRTEC) 10 MG tablet Take 1 tablet (10 mg total) by mouth daily. (Patient not taking: Reported on 12/07/2022) 30 tablet 11   Crisaborole (EUCRISA) 2 % OINT Apply to aa's BID. 100 g 3   fluticasone (FLONASE) 50 MCG/ACT nasal spray Place 2 sprays into both nostrils daily. Use for 4-6 weeks then stop and use seasonally or as needed. 16 g 3   furosemide (LASIX) 40 MG tablet Take 1 tablet (40 mg total)  by mouth as directed. Take Lasix 40 mg once daily. May take extra lasix after lunch with potassium sparingly for leg swelling 90 tablet 0   glipiZIDE (GLUCOTROL XL) 5 MG 24 hr tablet Take 1 tablet (5 mg total) by mouth daily with breakfast. 90 tablet 0   Hyoscyamine Sulfate SL 0.125 MG SUBL hyoscyamine 0.125 mg sublingual tablet     Loperamide HCl 1 MG/7.5ML LIQD Imodium A-D 1 mg/7.5 mL oral liquid     methocarbamol (ROBAXIN) 500 MG tablet Take 1 tablet (500 mg total) by mouth every 8 (eight) hours as needed for muscle spasms. 15 tablet 0   Multiple Vitamin (MULTIVITAMIN WITH MINERALS) TABS tablet Take 1 tablet by mouth daily.     ondansetron (ZOFRAN-ODT) 4 MG disintegrating tablet      potassium chloride SA (KLOR-CON M) 20 MEQ tablet Take 1 tablet (20 mEq total) by mouth as directed. Take 1 tablet once daily and Take extra potassium if you take lasix after lunch. 90 tablet 3   rosuvastatin (CRESTOR) 20 MG tablet Take 1 tablet (20 mg total) by mouth daily. 90 tablet 3   sacubitril-valsartan (ENTRESTO) 97-103 MG Take 1 tablet by mouth 2 (two) times daily. (Patient not taking: Reported on 12/17/2022) 180 tablet 3   spironolactone (ALDACTONE) 25 MG tablet Take 1 tablet (25 mg total) by mouth  daily. 90 tablet 3   tacrolimus (PROTOPIC) 0.1 % ointment Apply topically 2 (two) times daily. 100 g 2   triamcinolone cream (KENALOG) 0.1 % Apply to aa's BID x 2 weeks. Then decrease use to BID 5d/wk. 453.6 g 1   No current facility-administered medications for this visit.    No Known Allergies  Family History  Problem Relation Age of Onset   Lung cancer Mother    Hypertension Father    CAD Father        a. MI age 43   Heart disease Father    COPD Neg Hx    Diabetes Mellitus II Neg Hx     Social History   Socioeconomic History   Marital status: Single    Spouse name: Not on file   Number of children: 3   Years of education: 12   Highest education level: 12th grade  Occupational History    Occupation: Arts development officer  Tobacco Use   Smoking status: Never   Smokeless tobacco: Never  Vaping Use   Vaping status: Never Used  Substance and Sexual Activity   Alcohol use: No   Drug use: No   Sexual activity: Never  Other Topics Concern   Not on file  Social History Narrative   Not on file   Social Determinants of Health   Financial Resource Strain: Low Risk  (12/07/2022)   Overall Financial Resource Strain (CARDIA)    Difficulty of Paying Living Expenses: Not hard at all  Food Insecurity: No Food Insecurity (12/07/2022)   Hunger Vital Sign    Worried About Running Out of Food in the Last Year: Never true    Ran Out of Food in the Last Year: Never true  Transportation Needs: No Transportation Needs (12/07/2022)   PRAPARE - Administrator, Civil Service (Medical): No    Lack of Transportation (Non-Medical): No  Physical Activity: Unknown (12/07/2022)   Exercise Vital Sign    Days of Exercise per Week: 2 days    Minutes of Exercise per Session: Not on file  Stress: No Stress Concern Present (12/07/2022)   [employer] of Occupational Health - Occupational Stress Questionnaire    Feeling of Stress : Not at all  Social Connections: Moderately Isolated (12/07/2022)   Social Connection and Isolation Panel [NHANES]    Frequency of Communication with Friends and Family: More than three times a week    Frequency of Social Gatherings with Friends and Family: More than three times a week    Attends Religious Services: More than 4 times per year    Active Member of [employer] or Organizations: No    Attends Banker Meetings: Never    Marital Status: Divorced  Catering manager Violence: Not At Risk (12/07/2022)   Humiliation, Afraid, Rape, and Kick questionnaire    Fear of Current or Ex-Partner: No    Emotionally Abused: No    Physically Abused: No    Sexually Abused: No     Constitutional: Denies fever, malaise, fatigue, headache or abrupt weight changes.  HEENT:  Denies eye pain, eye redness, ear pain, ringing in the ears, wax buildup, runny nose, nasal congestion, bloody nose, or sore throat. Respiratory: Denies difficulty breathing, shortness of breath, cough or sputum production.   Cardiovascular: Denies chest pain, chest tightness, palpitations or swelling in the hands or feet.  Gastrointestinal: Denies abdominal pain, bloating, constipation, diarrhea or blood in the stool.  GU: Denies urgency, frequency, pain with urination, burning sensation,  blood in urine, odor or discharge. Musculoskeletal: Denies decrease in range of motion, difficulty with gait, muscle pain or joint pain and swelling.  Skin: Denies redness, rashes, lesions or ulcercations.  Neurological: Denies dizziness, difficulty with memory, difficulty with speech or problems with balance and coordination.  Psych: Denies anxiety, depression, SI/HI.  No other specific complaints in a complete review of systems (except as listed in HPI above).  Objective:   Physical Exam   There were no vitals taken for this visit. Wt Readings from Last 3 Encounters:  07/10/22 119 lb (54 kg)  07/03/22 119 lb (54 kg)  06/26/22 117 lb (53.1 kg)    General: Appears their stated age, well developed, well nourished in NAD. Skin: Warm, dry and intact. No rashes, lesions or ulcerations noted. HEENT: Head: normal shape and size; Eyes: sclera white, no icterus, conjunctiva pink, PERRLA and EOMs intact; Ears: Tm's gray and intact, normal light reflex; Nose: mucosa pink and moist, septum midline; Throat/Mouth: Teeth present, mucosa pink and moist, no exudate, lesions or ulcerations noted.  Neck:  Neck supple, trachea midline. No masses, lumps or thyromegaly present.  Cardiovascular: Normal rate and rhythm. S1,S2 noted.  No murmur, rubs or gallops noted. No JVD or BLE edema. No carotid bruits noted. Pulmonary/Chest: Normal effort and positive vesicular breath sounds. No respiratory distress. No wheezes, rales or  ronchi noted.  Abdomen: Soft and nontender. Normal bowel sounds. No distention or masses noted. Liver, spleen and kidneys non palpable. Musculoskeletal: Normal range of motion. No signs of joint swelling. No difficulty with gait.  Neurological: Alert and oriented. Cranial nerves II-XII grossly intact. Coordination normal.  Psychiatric: Mood and affect normal. Behavior is normal. Judgment and thought content normal.     BMET    Component Value Date/Time   NA 140 04/25/2022 1608   NA 142 06/19/2018 1905   NA 129 (L) 04/25/2013 2004   K 3.1 (L) 04/25/2022 1608   K 5.7 (H) 04/25/2013 2004   CL 105 04/25/2022 1608   CL 97 (L) 04/25/2013 2004   CO2 23 04/25/2022 1608   CO2 12 (L) 04/25/2013 2004   GLUCOSE 193 (H) 04/25/2022 1608   GLUCOSE 370 (H) 04/25/2013 2004   BUN 16 04/25/2022 1608   BUN 14 06/19/2018 1905   BUN 17 04/25/2013 2004   CREATININE 0.85 04/25/2022 1608   CREATININE 0.89 01/19/2022 1105   CALCIUM 8.3 (L) 04/25/2022 1608   CALCIUM 9.7 04/25/2013 2004   GFRNONAA >60 04/25/2022 1608   GFRNONAA 66 03/28/2020 1044   GFRAA 77 03/28/2020 1044    Lipid Panel     Component Value Date/Time   CHOL 241 (H) 01/19/2022 1105   CHOL 216 (H) 06/19/2018 1905   TRIG 135 01/19/2022 1105   HDL 65 01/19/2022 1105   HDL 53 06/19/2018 1905   CHOLHDL 3.7 01/19/2022 1105   LDLCALC 150 (H) 01/19/2022 1105    CBC    Component Value Date/Time   WBC 9.9 04/25/2022 1608   RBC 4.80 04/25/2022 1608   HGB 13.1 04/25/2022 1608   HGB 11.6 06/19/2018 1905   HCT 40.9 04/25/2022 1608   HCT 35.4 06/19/2018 1905   PLT 352 04/25/2022 1608   PLT 435 06/19/2018 1905   MCV 85.2 04/25/2022 1608   MCV 85 06/19/2018 1905   MCV 88 04/25/2013 2004   MCH 27.3 04/25/2022 1608   MCHC 32.0 04/25/2022 1608   RDW 12.9 04/25/2022 1608   RDW 12.4 06/19/2018 1905   RDW 12.3  04/25/2013 2004   LYMPHSABS 2.0 04/25/2022 1608   MONOABS 0.5 04/25/2022 1608   EOSABS 0.1 04/25/2022 1608   BASOSABS 0.1  04/25/2022 1608    Hgb A1C Lab Results  Component Value Date   HGBA1C 7.4 (H) 01/19/2022           Assessment & Plan:   Preventative health maintenance:  Flu She declines tetanus for financial reasons, advised her if she gets bit or cut to go get this done Pneumovax UTD Encouraged her to get her COVID booster Discussed Shingrix vaccine, she will check coverage with her insurance company and schedule visit if she would like to have this done Pap smear Mammogram and bone density ordered-she will call to schedule Referral to GI for screening colonoscopy Encouraged her to consume a balanced diet and exercise regimen Advised her to see an eye doctor and dentist annually We will check CBC, c-Met, lipid, A1c and urine microalbumin today  RTC in 6 months, follow-up chronic conditions Nicki Reaper, NP

## 2023-01-23 ENCOUNTER — Encounter: Payer: Self-pay | Admitting: Pharmacist

## 2023-01-31 ENCOUNTER — Ambulatory Visit (INDEPENDENT_AMBULATORY_CARE_PROVIDER_SITE_OTHER): Payer: 59 | Admitting: Internal Medicine

## 2023-01-31 ENCOUNTER — Encounter: Payer: Self-pay | Admitting: Internal Medicine

## 2023-01-31 VITALS — BP 178/122 | HR 93 | Temp 95.5°F

## 2023-01-31 DIAGNOSIS — Z0001 Encounter for general adult medical examination with abnormal findings: Secondary | ICD-10-CM | POA: Diagnosis not present

## 2023-01-31 DIAGNOSIS — Z124 Encounter for screening for malignant neoplasm of cervix: Secondary | ICD-10-CM

## 2023-01-31 DIAGNOSIS — E119 Type 2 diabetes mellitus without complications: Secondary | ICD-10-CM | POA: Diagnosis not present

## 2023-01-31 DIAGNOSIS — I5022 Chronic systolic (congestive) heart failure: Secondary | ICD-10-CM | POA: Diagnosis not present

## 2023-01-31 DIAGNOSIS — Z780 Asymptomatic menopausal state: Secondary | ICD-10-CM

## 2023-01-31 DIAGNOSIS — Z794 Long term (current) use of insulin: Secondary | ICD-10-CM

## 2023-01-31 LAB — CBC
HCT: 41.4 % (ref 35.0–45.0)
Hemoglobin: 12.9 g/dL (ref 11.7–15.5)
MCH: 27.2 pg (ref 27.0–33.0)
MCHC: 31.2 g/dL — ABNORMAL LOW (ref 32.0–36.0)
MCV: 87.2 fL (ref 80.0–100.0)
MPV: 10.7 fL (ref 7.5–12.5)
Platelets: 388 10*3/uL (ref 140–400)
RBC: 4.75 10*6/uL (ref 3.80–5.10)
RDW: 12.5 % (ref 11.0–15.0)
WBC: 7.3 10*3/uL (ref 3.8–10.8)

## 2023-01-31 MED ORDER — SACUBITRIL-VALSARTAN 97-103 MG PO TABS: 1.0000 | ORAL_TABLET | Freq: Two times a day (BID) | ORAL | 1 refills | Status: DC

## 2023-01-31 MED ORDER — ROSUVASTATIN CALCIUM 20 MG PO TABS: 20.0000 mg | ORAL_TABLET | Freq: Every day | ORAL | 1 refills | Status: DC

## 2023-01-31 MED ORDER — SPIRONOLACTONE 25 MG PO TABS: 25.0000 mg | ORAL_TABLET | Freq: Every day | ORAL | 1 refills | Status: DC

## 2023-01-31 MED ORDER — METOPROLOL SUCCINATE ER 25 MG PO TB24: 25.0000 mg | ORAL_TABLET | Freq: Every day | ORAL | 1 refills | Status: DC

## 2023-01-31 NOTE — Progress Notes (Signed)
Subjective:    Patient ID: Erin Hayes, female    DOB: 1965-10-03, 57 y.o.   MRN: [id number]  HPI  Patient presents to clinic today for her annual exam.  Flu: 05/2019 Tetanus: > 10 years ago COVID: X 2 Pneumovax: 09/2020 Shingrix: never Pap smear: > 5 years ago Mammogram: never Bone density: never Colon screening: 01/2017 Vision screening: annually Dentist: as needed  Diet: She does eat meat. She consumes fruits and veggies. She does eat fried foods. She drinks mostly juice, water. Exercise: None   Review of Systems     Past Medical History:  Diagnosis Date   Carpal tunnel syndrome of right wrist    Chronic combined systolic (congestive) and diastolic (congestive) heart failure (HCC)    a. 03/2017 Echo: EF 20-25%, diff HK, Gr2 DD; b. 09/2017 Echo: EF 20-25%, diff HK, Gr2 DD.   Diabetes (HCC)    GERD (gastroesophageal reflux disease)    HLD (hyperlipidemia)    Hypertension    NICM (nonischemic cardiomyopathy) (HCC)    a. 03/2017 Echo: EF 20-25%, diff HK, Gr2 DD, mild to mod MR, nl RV fxn;  b. 03/2017 Cath: mild nonobs dzs, EF 25%; c. 09/2017 Echo: EF 20-25%, diff HK. Gr2 DD. Mild MR. Mildly reduced RV fxn.   Pleural effusion, left    a. 03/2017 s/p thoracentesis-->300 ml withdrawn--transudative.   Pneumonia 02/19/2017    Current Outpatient Medications  Medication Sig Dispense Refill   albuterol (VENTOLIN HFA) 108 (90 Base) MCG/ACT inhaler 2 puff EVERY 4 HOURS (route: inhalation)     apixaban (ELIQUIS) 2.5 MG TABS tablet Take 1 tablet (2.5 mg total) by mouth 2 (two) times daily. 60 tablet 3   carvedilol (COREG) 12.5 MG tablet Take 1.5 tablets (18.75 mg total) by mouth 2 (two) times daily. 270 tablet 3   cetirizine (ZYRTEC) 10 MG tablet Take 1 tablet (10 mg total) by mouth daily. (Patient not taking: Reported on 12/07/2022) 30 tablet 11   Crisaborole (EUCRISA) 2 % OINT Apply to aa's BID. 100 g 3   fluticasone (FLONASE) 50 MCG/ACT nasal spray Place 2 sprays into both  nostrils daily. Use for 4-6 weeks then stop and use seasonally or as needed. 16 g 3   furosemide (LASIX) 40 MG tablet Take 1 tablet (40 mg total) by mouth as directed. Take Lasix 40 mg once daily. May take extra lasix after lunch with potassium sparingly for leg swelling 90 tablet 0   glipiZIDE (GLUCOTROL XL) 5 MG 24 hr tablet Take 1 tablet (5 mg total) by mouth daily with breakfast. 90 tablet 0   Hyoscyamine Sulfate SL 0.125 MG SUBL hyoscyamine 0.125 mg sublingual tablet     Loperamide HCl 1 MG/7.5ML LIQD Imodium A-D 1 mg/7.5 mL oral liquid     methocarbamol (ROBAXIN) 500 MG tablet Take 1 tablet (500 mg total) by mouth every 8 (eight) hours as needed for muscle spasms. 15 tablet 0   Multiple Vitamin (MULTIVITAMIN WITH MINERALS) TABS tablet Take 1 tablet by mouth daily.     ondansetron (ZOFRAN-ODT) 4 MG disintegrating tablet      potassium chloride SA (KLOR-CON M) 20 MEQ tablet Take 1 tablet (20 mEq total) by mouth as directed. Take 1 tablet once daily and Take extra potassium if you take lasix after lunch. 90 tablet 3   rosuvastatin (CRESTOR) 20 MG tablet Take 1 tablet (20 mg total) by mouth daily. 90 tablet 3   sacubitril-valsartan (ENTRESTO) 97-103 MG Take 1 tablet by mouth 2 (two)  times daily. (Patient not taking: Reported on 12/17/2022) 180 tablet 3   spironolactone (ALDACTONE) 25 MG tablet Take 1 tablet (25 mg total) by mouth daily. 90 tablet 3   tacrolimus (PROTOPIC) 0.1 % ointment Apply topically 2 (two) times daily. 100 g 2   triamcinolone cream (KENALOG) 0.1 % Apply to aa's BID x 2 weeks. Then decrease use to BID 5d/wk. 453.6 g 1   No current facility-administered medications for this visit.    No Known Allergies  Family History  Problem Relation Age of Onset   Lung cancer Mother    Hypertension Father    CAD Father        a. MI age 40   Heart disease Father    COPD Neg Hx    Diabetes Mellitus II Neg Hx     Social History   Socioeconomic History   Marital status: Single     Spouse name: Not on file   Number of children: 3   Years of education: 12   Highest education level: 12th grade  Occupational History   Occupation: Arts development officer  Tobacco Use   Smoking status: Never   Smokeless tobacco: Never  Vaping Use   Vaping status: Never Used  Substance and Sexual Activity   Alcohol use: No   Drug use: No   Sexual activity: Never  Other Topics Concern   Not on file  Social History Narrative   Not on file   Social Determinants of Health   Financial Resource Strain: Low Risk  (12/07/2022)   Overall Financial Resource Strain (CARDIA)    Difficulty of Paying Living Expenses: Not hard at all  Food Insecurity: No Food Insecurity (12/07/2022)   Hunger Vital Sign    Worried About Running Out of Food in the Last Year: Never true    Ran Out of Food in the Last Year: Never true  Transportation Needs: No Transportation Needs (12/07/2022)   PRAPARE - Administrator, Civil Service (Medical): No    Lack of Transportation (Non-Medical): No  Physical Activity: Unknown (12/07/2022)   Exercise Vital Sign    Days of Exercise per Week: 2 days    Minutes of Exercise per Session: Not on file  Stress: No Stress Concern Present (12/07/2022)   [employer] of Occupational Health - Occupational Stress Questionnaire    Feeling of Stress : Not at all  Social Connections: Moderately Isolated (12/07/2022)   Social Connection and Isolation Panel [NHANES]    Frequency of Communication with Friends and Family: More than three times a week    Frequency of Social Gatherings with Friends and Family: More than three times a week    Attends Religious Services: More than 4 times per year    Active Member of [employer] or Organizations: No    Attends Banker Meetings: Never    Marital Status: Divorced  Catering manager Violence: Not At Risk (12/07/2022)   Humiliation, Afraid, Rape, and Kick questionnaire    Fear of Current or Ex-Partner: No    Emotionally Abused: No     Physically Abused: No    Sexually Abused: No     Constitutional: Denies fever, malaise, fatigue, headache or abrupt weight changes.  HEENT: Denies eye pain, eye redness, ear pain, ringing in the ears, wax buildup, runny nose, nasal congestion, bloody nose, or sore throat. Respiratory: Denies difficulty breathing, shortness of breath, cough or sputum production.   Cardiovascular: Denies chest pain, chest tightness, palpitations or swelling in the  hands or feet.  Gastrointestinal: Denies abdominal pain, bloating, constipation, diarrhea or blood in the stool.  GU: Denies urgency, frequency, pain with urination, burning sensation, blood in urine, odor or discharge. Musculoskeletal: Patient reports left ankle pain, difficulty with gait.  Denies decrease in range of motion,  muscle pain or joint swelling.  Skin: Denies redness, rashes, lesions or ulcercations.  Neurological: Denies dizziness, difficulty with memory, difficulty with speech or problems with balance and coordination.  Psych: Denies anxiety, depression, SI/HI.  No other specific complaints in a complete review of systems (except as listed in HPI above).  Objective:   Physical Exam   BP (!) 178/122 (BP Location: Right Arm, Patient Position: Sitting, Cuff Size: Normal)   Pulse 93   Temp (!) 95.5 F (35.3 C) (Temporal)   SpO2 96%   Wt Readings from Last 3 Encounters:  07/10/22 119 lb (54 kg)  07/03/22 119 lb (54 kg)  06/26/22 117 lb (53.1 kg)    General: Appears her stated age, well developed, well nourished in NAD. Skin: Warm, dry and intact. No ulcerations noted. HEENT: Head: normal shape and size; Eyes: sclera white, no icterus, conjunctiva pink, PERRLA and EOMs intact;  Neck:  Neck supple, trachea midline. No masses, lumps or thyromegaly present.  Cardiovascular: Normal rate and rhythm. S1,S2 noted.  No murmur, rubs or gallops noted. No JVD or BLE edema. No carotid bruits noted. Pulmonary/Chest: Normal effort and  positive vesicular breath sounds. No respiratory distress. No wheezes, rales or ronchi noted.  Abdomen: Soft and nontender. Normal bowel sounds.  Musculoskeletal: Walking boot to left lower extremity.  In wheelchair for assistance with gait. Neurological: Alert and oriented. Cranial nerves II-XII grossly intact. Coordination normal.  Psychiatric: Mood and affect normal. Behavior is normal. Judgment and thought content normal.   BMET    Component Value Date/Time   NA 140 04/25/2022 1608   NA 142 06/19/2018 1905   NA 129 (L) 04/25/2013 2004   K 3.1 (L) 04/25/2022 1608   K 5.7 (H) 04/25/2013 2004   CL 105 04/25/2022 1608   CL 97 (L) 04/25/2013 2004   CO2 23 04/25/2022 1608   CO2 12 (L) 04/25/2013 2004   GLUCOSE 193 (H) 04/25/2022 1608   GLUCOSE 370 (H) 04/25/2013 2004   BUN 16 04/25/2022 1608   BUN 14 06/19/2018 1905   BUN 17 04/25/2013 2004   CREATININE 0.85 04/25/2022 1608   CREATININE 0.89 01/19/2022 1105   CALCIUM 8.3 (L) 04/25/2022 1608   CALCIUM 9.7 04/25/2013 2004   GFRNONAA >60 04/25/2022 1608   GFRNONAA 66 03/28/2020 1044   GFRAA 77 03/28/2020 1044    Lipid Panel     Component Value Date/Time   CHOL 241 (H) 01/19/2022 1105   CHOL 216 (H) 06/19/2018 1905   TRIG 135 01/19/2022 1105   HDL 65 01/19/2022 1105   HDL 53 06/19/2018 1905   CHOLHDL 3.7 01/19/2022 1105   LDLCALC 150 (H) 01/19/2022 1105    CBC    Component Value Date/Time   WBC 9.9 04/25/2022 1608   RBC 4.80 04/25/2022 1608   HGB 13.1 04/25/2022 1608   HGB 11.6 06/19/2018 1905   HCT 40.9 04/25/2022 1608   HCT 35.4 06/19/2018 1905   PLT 352 04/25/2022 1608   PLT 435 06/19/2018 1905   MCV 85.2 04/25/2022 1608   MCV 85 06/19/2018 1905   MCV 88 04/25/2013 2004   MCH 27.3 04/25/2022 1608   MCHC 32.0 04/25/2022 1608   RDW 12.9 04/25/2022  1608   RDW 12.4 06/19/2018 1905   RDW 12.3 04/25/2013 2004   LYMPHSABS 2.0 04/25/2022 1608   MONOABS 0.5 04/25/2022 1608   EOSABS 0.1 04/25/2022 1608   BASOSABS  0.1 04/25/2022 1608    Hgb A1C Lab Results  Component Value Date   HGBA1C 7.4 (H) 01/19/2022           Assessment & Plan:   Preventative health maintenance:  Flu shot declined Tetanus declined Encouraged her to get her COVID booster Pneumovax UTD Discussed Shingrix vaccine, will check coverage with her insurance company and schedule visit if she would like to have this done Referral to GYN for Pap smear Pap smear Mammogram previously ordered-she just needs to call and schedule this Bone density ordered-she will call to schedule Colon screening UTD Encouraged her to consume a balanced diet and exercise regimen Advised her to see an eye doctor and dentist annually We will check CBC, c-Met, lipid, A1c and urine microalbumin today  RTC in 3 months, follow-up chronic conditions Nicki Reaper, NP

## 2023-01-31 NOTE — Patient Instructions (Signed)
Health Maintenance for Postmenopausal Women Menopause is a normal process in which your ability to get pregnant comes to an end. This process happens slowly over many months or years, usually between the ages of 48 and 55. Menopause is complete when you have missed your menstrual period for 12 months. It is important to talk with your health care provider about some of the most common conditions that affect women after menopause (postmenopausal women). These include heart disease, cancer, and bone loss (osteoporosis). Adopting a healthy lifestyle and getting preventive care can help to promote your health and wellness. The actions you take can also lower your chances of developing some of these common conditions. What are the signs and symptoms of menopause? During menopause, you may have the following symptoms: Hot flashes. These can be moderate or severe. Night sweats. Decrease in sex drive. Mood swings. Headaches. Tiredness (fatigue). Irritability. Memory problems. Problems falling asleep or staying asleep. Talk with your health care provider about treatment options for your symptoms. Do I need hormone replacement therapy? Hormone replacement therapy is effective in treating symptoms that are caused by menopause, such as hot flashes and night sweats. Hormone replacement carries certain risks, especially as you become older. If you are thinking about using estrogen or estrogen with progestin, discuss the benefits and risks with your health care provider. How can I reduce my risk for heart disease and stroke? The risk of heart disease, heart attack, and stroke increases as you age. One of the causes may be a change in the body's hormones during menopause. This can affect how your body uses dietary fats, triglycerides, and cholesterol. Heart attack and stroke are medical emergencies. There are many things that you can do to help prevent heart disease and stroke. Watch your blood pressure High  blood pressure causes heart disease and increases the risk of stroke. This is more likely to develop in people who have high blood pressure readings or are overweight. Have your blood pressure checked: Every 3-5 years if you are 18-39 years of age. Every year if you are 40 years old or older. Eat a healthy diet  Eat a diet that includes plenty of vegetables, fruits, low-fat dairy products, and lean protein. Do not eat a lot of foods that are high in solid fats, added sugars, or sodium. Get regular exercise Get regular exercise. This is one of the most important things you can do for your health. Most adults should: Try to exercise for at least 150 minutes each week. The exercise should increase your heart rate and make you sweat (moderate-intensity exercise). Try to do strengthening exercises at least twice each week. Do these in addition to the moderate-intensity exercise. Spend less time sitting. Even light physical activity can be beneficial. Other tips Work with your health care provider to achieve or maintain a healthy weight. Do not use any products that contain nicotine or tobacco. These products include cigarettes, chewing tobacco, and vaping devices, such as e-cigarettes. If you need help quitting, ask your health care provider. Know your numbers. Ask your health care provider to check your cholesterol and your blood sugar (glucose). Continue to have your blood tested as directed by your health care provider. Do I need screening for cancer? Depending on your health history and family history, you may need to have cancer screenings at different stages of your life. This may include screening for: Breast cancer. Cervical cancer. Lung cancer. Colorectal cancer. What is my risk for osteoporosis? After menopause, you may be   at increased risk for osteoporosis. Osteoporosis is a condition in which bone destruction happens more quickly than new bone creation. To help prevent osteoporosis or  the bone fractures that can happen because of osteoporosis, you may take the following actions: If you are 19-50 years old, get at least 1,000 mg of calcium and at least 600 international units (IU) of vitamin D per day. If you are older than age 50 but younger than age 70, get at least 1,200 mg of calcium and at least 600 international units (IU) of vitamin D per day. If you are older than age 70, get at least 1,200 mg of calcium and at least 800 international units (IU) of vitamin D per day. Smoking and drinking excessive alcohol increase the risk of osteoporosis. Eat foods that are rich in calcium and vitamin D, and do weight-bearing exercises several times each week as directed by your health care provider. How does menopause affect my mental health? Depression may occur at any age, but it is more common as you become older. Common symptoms of depression include: Feeling depressed. Changes in sleep patterns. Changes in appetite or eating patterns. Feeling an overall lack of motivation or enjoyment of activities that you previously enjoyed. Frequent crying spells. Talk with your health care provider if you think that you are experiencing any of these symptoms. General instructions See your health care provider for regular wellness exams and vaccines. This may include: Scheduling regular health, dental, and eye exams. Getting and maintaining your vaccines. These include: Influenza vaccine. Get this vaccine each year before the flu season begins. Pneumonia vaccine. Shingles vaccine. Tetanus, diphtheria, and pertussis (Tdap) booster vaccine. Your health care provider may also recommend other immunizations. Tell your health care provider if you have ever been abused or do not feel safe at home. Summary Menopause is a normal process in which your ability to get pregnant comes to an end. This condition causes hot flashes, night sweats, decreased interest in sex, mood swings, headaches, or lack  of sleep. Treatment for this condition may include hormone replacement therapy. Take actions to keep yourself healthy, including exercising regularly, eating a healthy diet, watching your weight, and checking your blood pressure and blood sugar levels. Get screened for cancer and depression. Make sure that you are up to date with all your vaccines. This information is not intended to replace advice given to you by your health care provider. Make sure you discuss any questions you have with your health care provider. Document Revised: 09/12/2020 Document Reviewed: 09/12/2020 Elsevier Patient Education  2024 Elsevier Inc.  

## 2023-02-01 LAB — COMPLETE METABOLIC PANEL WITH GFR
AG Ratio: 1.6 (calc) (ref 1.0–2.5)
ALT: 16 U/L (ref 6–29)
AST: 16 U/L (ref 10–35)
Albumin: 4 g/dL (ref 3.6–5.1)
Alkaline phosphatase (APISO): 109 U/L (ref 37–153)
BUN: 12 mg/dL (ref 7–25)
CO2: 26 mmol/L (ref 20–32)
Calcium: 8.8 mg/dL (ref 8.6–10.4)
Chloride: 104 mmol/L (ref 98–110)
Creat: 0.81 mg/dL (ref 0.50–1.03)
Globulin: 2.5 g/dL (ref 1.9–3.7)
Glucose, Bld: 114 mg/dL — ABNORMAL HIGH (ref 65–99)
Potassium: 3.7 mmol/L (ref 3.5–5.3)
Sodium: 140 mmol/L (ref 135–146)
Total Bilirubin: 1.1 mg/dL (ref 0.2–1.2)
Total Protein: 6.5 g/dL (ref 6.1–8.1)
eGFR: 84 mL/min/{1.73_m2} (ref >=60)

## 2023-02-01 LAB — MICROALBUMIN / CREATININE URINE RATIO
Creatinine, Urine: 190 mg/dL (ref 20–275)
Microalb Creat Ratio: 1276 mg/g{creat} — ABNORMAL HIGH (ref <30)
Microalb, Ur: 242.4 mg/dL

## 2023-02-01 LAB — HEMOGLOBIN A1C
Hgb A1c MFr Bld: 7.8 %{Hb} — ABNORMAL HIGH (ref <5.7)
Mean Plasma Glucose: 177 mg/dL
eAG (mmol/L): 9.8 mmol/L

## 2023-02-01 LAB — LIPID PANEL
Cholesterol: 160 mg/dL (ref <200)
HDL: 65 mg/dL (ref >=50)
LDL Cholesterol (Calc): 79 mg/dL
Non-HDL Cholesterol (Calc): 95 mg/dL (ref <130)
Total CHOL/HDL Ratio: 2.5 (calc) (ref <5.0)
Triglycerides: 80 mg/dL (ref <150)

## 2023-03-18 NOTE — Progress Notes (Unsigned)
Cardiology Office Note  Date:  03/19/2023   ID:  Erin Hayes, DOB 08/01/1965, MRN [id number]  PCP:  Lorre Munroe, NP   Chief Complaint  Patient presents with   6 month follow up     "Doing well." Medications reviewed by the patient verbally.     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, placed back on Eliquis Who presents for routine follow-up of her nonischemic cardiomyopathy  Last seen in clinic by myself March 2024  Hospital records requested and reviewed from [facility] Admission to the hospital July 2024 recurrent DVT, weakness Partially occlusive thrombus extending into bilateral common femoral veins, iliac vein, infrahepatic IVC CTA PE study with no pulmonary embolism was started on heparin transition to Eliquis  Echocardiogram with EF 25%, 1.6 cm aneurysmal segment mid left ventricular septum, Underwent Myoview which was low risk Was started on Lipitor 80 (previously on Crestor)  Creatinine 2.43 up from 0.9 felt secondary to contrast nephropathy as she received contrast studies at [facility] Nephrology consulted during admission, Entresto/losartan, spironolactone held Plan was to restart these as an outpatient  In follow-up today, some medication confusion, did not bring list with her of what she is taking Lipitor and Crestor both on her list Lasix is not on her list Hydralazine 25 once a day, Entresto  back on her list with spironolactone, unclear if she is taking  Lab work reviewed A1C 7.8 Total chol 160, 79 Creatinine 0.81 in January 31, 2023  Continues to wear boot on her left ankle/foot for stress fracture  February 2023 closed displaced fracture left femoral neck Ultrasound Sep 06, 2021 DVT Ultrasound November 24, 2021 positive for DVT lower extremity Completed Eliquis 5 mg twice daily for 6 months 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 at [facility]  EKG personally reviewed by myself on todays visit EKG Interpretation Date/Time:  Tuesday March 19 2023 09:15:25 EST Ventricular Rate:  77 PR Interval:  144 QRS Duration:  124 QT Interval:  424 QTC Calculation: 479 R Axis:   -51  Text Interpretation: Normal sinus rhythm Left axis deviation Left bundle branch block When compared with ECG of 19-Mar-2023 09:14, No significant change was found Confirmed by Julien Nordmann [phone]) on 03/19/2023 9:27:41 AM   Seen in the emergency room November 24, 2021 for left leg swelling, diagnosed with DVT Ultrasound concerning for DVT, was restarted on Eliquis  Other Past medical history reviewed Echo 05/21/2019 reviewed  1. Left ventricular ejection fraction, by visual estimation, is 45 to  50%. The left ventricle has normal function. There is mildly increased  left ventricular hypertrophy.   Prior medical records reviewed on today's visit  ED 02/04/17 due to pneumonia  Admitted 02/18/17 due to LLL pneumonia. Initially needed IV antibiotics & then transitioned to oral antibiotics. Discharged  Admitted 04/03/17 due to acute HF.   IV diuretics  echo and  catheterization by Dr.  Kirke Corin showing nonischemic cardiomyopathy ejection fraction less than 25%   Recently she reports she has been doing well, denies any lower extremity edema or shortness of breath or abdominal bloating Weight has been  stable, Carvedilol is on her list but she does not have this medication No significant change in her weight Feels almost back to normal   cardiac catheterization November 2018 Ejection fraction 25% or less, mild nonobstructive coronary disease, left ventricular  end-diastolic pressure 33   PMH:   has a past medical history of Carpal tunnel syndrome of right wrist, Chronic combined systolic (congestive) and diastolic (congestive) heart failure (HCC), Diabetes (HCC), GERD (gastroesophageal reflux disease), HLD (hyperlipidemia), Hypertension, NICM (nonischemic cardiomyopathy) (HCC), Pleural effusion, left, and Pneumonia (02/19/2017).  PSH:    Past Surgical History:  Procedure Laterality Date   COLONOSCOPY WITH PROPOFOL N/A 01/21/2017   Procedure: COLONOSCOPY WITH PROPOFOL;  Surgeon: Wyline Mood, MD;  Location: [facility] ENDOSCOPY;  Service: Gastroenterology;  Laterality: N/A;   ESOPHAGOGASTRODUODENOSCOPY (EGD) WITH PROPOFOL N/A 01/21/2017   Procedure: ESOPHAGOGASTRODUODENOSCOPY (EGD) WITH PROPOFOL;  Surgeon: Wyline Mood, MD;  Location: [facility] ENDOSCOPY;  Service: Gastroenterology;  Laterality: N/A;   FLEXIBLE SIGMOIDOSCOPY N/A 11/04/2016   Procedure: FLEXIBLE SIGMOIDOSCOPY;  Surgeon: Charlott Rakes, MD;  Location: [facility] ENDOSCOPY;  Service: Endoscopy;  Laterality: N/A;   HIP PINNING,CANNULATED Left 06/19/2021   Procedure: CANNULATED HIP PINNING;  Surgeon: Ross Marcus, MD;  Location: [facility] ORS;  Service: Orthopedics;  Laterality: Left;   LEFT HEART CATH AND CORONARY ANGIOGRAPHY N/A 04/05/2017   Procedure: LEFT HEART CATH AND CORONARY ANGIOGRAPHY;  Surgeon: Iran Ouch, MD;  Location: [facility] INVASIVE CV LAB;  Service: Cardiovascular;  Laterality: N/A;   LITHOTRIPSY     Current Outpatient Medications on File Prior to Visit  Medication Sig Dispense Refill   albuterol (VENTOLIN HFA) 108 (90 Base) MCG/ACT inhaler      apixaban (ELIQUIS) 5 MG TABS tablet Take 5 mg by mouth 2 (two) times daily.     atorvastatin (LIPITOR) 80 MG tablet Take 1 tablet by mouth daily.     glipiZIDE (GLUCOTROL XL) 5 MG 24 hr tablet Take 1 tablet (5 mg total) by mouth daily with breakfast. 90 tablet 0   hydrALAZINE (APRESOLINE) 25 MG tablet Take 25 mg by mouth daily.     Loperamide  HCl 1 MG/7.5ML LIQD Imodium A-D 1 mg/7.5 mL oral liquid     metoprolol succinate (TOPROL-XL) 25 MG 24 hr tablet Take 1 tablet (25 mg total) by mouth daily. 90 tablet 1   Multiple Vitamin (MULTIVITAMIN WITH MINERALS) TABS tablet Take 1 tablet by mouth daily.     rosuvastatin (CRESTOR) 20 MG tablet Take 1 tablet (20 mg total) by mouth daily. 90 tablet 1   sacubitril-valsartan (ENTRESTO) 97-103 MG Take 1 tablet by mouth 2 (two) times daily. 180 tablet 1   spironolactone (ALDACTONE) 25 MG tablet Take 1 tablet (25 mg total) by mouth daily. 90 tablet 1   No current facility-administered medications on file prior to visit.    Allergies:   Patient has no known allergies.   Social History:  The patient  reports that she has never smoked. She has never used smokeless tobacco. She reports that she does not drink alcohol and does not use drugs.   Family History:   family history includes CAD in her father; Heart disease in her father; Hypertension in her father; Lung cancer in her mother.    Review of Systems: Review of Systems  Constitutional: Negative.   Respiratory: Negative.    Cardiovascular: Negative.   Gastrointestinal: Negative.   Musculoskeletal: Negative.   Neurological: Negative.   Psychiatric/Behavioral: Negative.    All other systems reviewed and are negative.  PHYSICAL EXAM: VS:  BP (!) 158/100 (BP Location: Left Arm, Patient Position: Sitting, Cuff Size: Normal)   Pulse 77   Ht 4\' 11"  (1.499 m)   Wt 120 lb (54.4 kg) Comment: weight at home  SpO2 96%   BMI 24.24 kg/m  , BMI Body mass index is 24.24 kg/m. Constitutional:  oriented to person, place, and time. No distress.  HENT:  Head: Grossly normal Eyes:  no discharge. No scleral icterus.  Neck: No JVD, no carotid bruits  Cardiovascular: Regular rate and rhythm, no murmurs appreciated Pulmonary/Chest: Clear to auscultation bilaterally, no wheezes or rails Abdominal: Soft.  no distension.  no tenderness.   Musculoskeletal: Normal range of motion Neurological:  normal muscle tone. Coordination normal. No atrophy Skin: Skin warm and dry Psychiatric: normal affect, pleasant  Recent Labs: 04/25/2022: B Natriuretic Peptide 653.8 01/31/2023: ALT 16; BUN 12; Creat 0.81; Hemoglobin 12.9; Platelets 388; Potassium 3.7; Sodium 140    Lipid Panel Lab Results  Component Value Date   CHOL 160 01/31/2023   HDL 65 01/31/2023   LDLCALC 79 01/31/2023   TRIG 80 01/31/2023      Wt Readings from Last 3 Encounters:  03/19/23 120 lb (54.4 kg)  07/10/22 119 lb (54 kg)  07/03/22 119 lb (54 kg)     ASSESSMENT AND PLAN:  Chronic systolic congestive heart failure (HCC) -  Echocardiogram 45 to 50% in January 2021 Echocardiogram performed November 29, 2022 [facility] EF estimated 25% Medications held at discharge from [facility] She did not bring medication list with her We have recommended she restart Lasix 40 daily, Entresto 97/103 twice daily, spironolactone 25 daily, metoprolol succinate 25 daily  Type 2 diabetes mellitus without complication, without long-term current use of insulin (HCC)  hemoglobin A1c mildly elevated Stressed importance of calorie restriction Unable to exercise at this time  NICM (nonischemic cardiomyopathy) (HCC) EF 45 to 50% in 2021 Down to 25% July 2024 Restart medications as above  Essential hypertension Blood pressure elevated, recommend she restart medications as above Suggest she bring her medications with her on next clinic visit or at least an updated list  Left lower extremity DVT Recurrent DVTs in 2023, July 2024 Left foot with stress fracture in a boot, on Eliquis 5 twice daily  Hyperlipidemia Her list has both Lipitor and Crestor, recommend she stop the Lipitor and take Crestor 20 daily which she was taking previously She will call us to clarify    Orders Placed This Encounter  Procedures   EKG 12-Lead   EKG 12-Lead     Signed, Dossie Arbour, M.D.,  Ph.D. 03/19/2023  [facility] [place], [place] [phone]

## 2023-03-19 ENCOUNTER — Encounter: Payer: Self-pay | Admitting: Cardiovascular Disease

## 2023-03-19 ENCOUNTER — Ambulatory Visit: Payer: 59 | Attending: Cardiovascular Disease | Admitting: Cardiovascular Disease

## 2023-03-19 VITALS — BP 158/100 | HR 77 | Ht 59.0 in | Wt 120.0 lb

## 2023-03-19 DIAGNOSIS — I428 Other cardiomyopathies: Secondary | ICD-10-CM

## 2023-03-19 DIAGNOSIS — E119 Type 2 diabetes mellitus without complications: Secondary | ICD-10-CM | POA: Diagnosis not present

## 2023-03-19 DIAGNOSIS — Z794 Long term (current) use of insulin: Secondary | ICD-10-CM

## 2023-03-19 DIAGNOSIS — I5022 Chronic systolic (congestive) heart failure: Secondary | ICD-10-CM

## 2023-03-19 DIAGNOSIS — I825Y9 Chronic embolism and thrombosis of unspecified deep veins of unspecified proximal lower extremity: Secondary | ICD-10-CM

## 2023-03-19 DIAGNOSIS — E782 Mixed hyperlipidemia: Secondary | ICD-10-CM | POA: Diagnosis not present

## 2023-03-19 DIAGNOSIS — I10 Essential (primary) hypertension: Secondary | ICD-10-CM

## 2023-03-19 MED ORDER — FUROSEMIDE 40 MG PO TABS: 40.0000 mg | ORAL_TABLET | Freq: Every day | ORAL | 3 refills | Status: DC

## 2023-03-19 MED ORDER — HYDRALAZINE HCL 25 MG PO TABS: 25.0000 mg | ORAL_TABLET | Freq: Three times a day (TID) | ORAL | 6 refills | Status: DC

## 2023-03-19 NOTE — Patient Instructions (Addendum)
Medication Instructions:  Increase hydralazine up to three times a day Restart Entresto 97/103 mg twice a day Restart spironolactone 25 mg daily Restart metoprolol 25 daily Restart lasix 40 Mg daily Stay on apixaban/Eliquis 5 mg twice a day  Stay on crestor/rosuvastatin  Make sure you are not take atorvastatin/lipitor  If you need a refill on your cardiac medications before your next appointment, please call your pharmacy.   Lab work: No new labs needed  Testing/Procedures: No new testing needed  Follow-Up: At [facility], you and your health needs are our priority.  As part of our continuing mission to provide you with exceptional heart care, we have created designated Provider Care Teams.  These Care Teams include your primary Cardiologist (physician) and Advanced Practice Providers (APPs -  Physician Assistants and Nurse Practitioners) who all work together to provide you with the care you need, when you need it.  You will need a follow up appointment in 6 months  Providers on your designated Care Team:   Nicolasa Ducking, NP Eula Listen, PA-C Cadence Fransico [place], [place]  COVID-19 Vaccine Information can be found at: [email or web address] For questions related to vaccine distribution or appointments, please email [email or web address] or call [phone].

## 2023-04-06 ENCOUNTER — Inpatient Hospital Stay
Admission: EM | Admit: 2023-04-06 | Discharge: 2023-04-09 | DRG: 064 | Disposition: A | Discharge status: Home Health | Payer: 59 | Attending: Internal Medicine | Admitting: Internal Medicine

## 2023-04-06 ENCOUNTER — Emergency Department: Payer: 59

## 2023-04-06 ENCOUNTER — Other Ambulatory Visit: Payer: Self-pay

## 2023-04-06 DIAGNOSIS — I2694 Multiple subsegmental pulmonary emboli without acute cor pulmonale: Secondary | ICD-10-CM | POA: Diagnosis present

## 2023-04-06 DIAGNOSIS — I3139 Other pericardial effusion (noninflammatory): Secondary | ICD-10-CM | POA: Diagnosis not present

## 2023-04-06 DIAGNOSIS — I619 Nontraumatic intracerebral hemorrhage, unspecified: Secondary | ICD-10-CM | POA: Diagnosis present

## 2023-04-06 DIAGNOSIS — N179 Acute kidney failure, unspecified: Secondary | ICD-10-CM | POA: Diagnosis present

## 2023-04-06 DIAGNOSIS — I5022 Chronic systolic (congestive) heart failure: Secondary | ICD-10-CM | POA: Insufficient documentation

## 2023-04-06 DIAGNOSIS — Z86718 Personal history of other venous thrombosis and embolism: Secondary | ICD-10-CM

## 2023-04-06 DIAGNOSIS — I10 Essential (primary) hypertension: Secondary | ICD-10-CM

## 2023-04-06 DIAGNOSIS — R4701 Aphasia: Secondary | ICD-10-CM | POA: Diagnosis present

## 2023-04-06 DIAGNOSIS — I482 Chronic atrial fibrillation, unspecified: Secondary | ICD-10-CM | POA: Diagnosis not present

## 2023-04-06 DIAGNOSIS — R29700 NIHSS score 0: Secondary | ICD-10-CM | POA: Diagnosis present

## 2023-04-06 DIAGNOSIS — E119 Type 2 diabetes mellitus without complications: Secondary | ICD-10-CM

## 2023-04-06 DIAGNOSIS — I63412 Cerebral infarction due to embolism of left middle cerebral artery: Principal | ICD-10-CM | POA: Diagnosis present

## 2023-04-06 DIAGNOSIS — Z91199 Patient's noncompliance with other medical treatment and regimen due to unspecified reason: Secondary | ICD-10-CM

## 2023-04-06 DIAGNOSIS — E7800 Pure hypercholesterolemia, unspecified: Secondary | ICD-10-CM | POA: Diagnosis present

## 2023-04-06 DIAGNOSIS — I447 Left bundle-branch block, unspecified: Secondary | ICD-10-CM | POA: Diagnosis present

## 2023-04-06 DIAGNOSIS — Z87891 Personal history of nicotine dependence: Secondary | ICD-10-CM

## 2023-04-06 DIAGNOSIS — Z79899 Other long term (current) drug therapy: Secondary | ICD-10-CM

## 2023-04-06 DIAGNOSIS — E1169 Type 2 diabetes mellitus with other specified complication: Secondary | ICD-10-CM | POA: Insufficient documentation

## 2023-04-06 DIAGNOSIS — I639 Cerebral infarction, unspecified: Principal | ICD-10-CM | POA: Diagnosis present

## 2023-04-06 DIAGNOSIS — I82402 Acute embolism and thrombosis of unspecified deep veins of left lower extremity: Secondary | ICD-10-CM | POA: Diagnosis present

## 2023-04-06 DIAGNOSIS — Z7901 Long term (current) use of anticoagulants: Secondary | ICD-10-CM

## 2023-04-06 DIAGNOSIS — I5042 Chronic combined systolic (congestive) and diastolic (congestive) heart failure: Secondary | ICD-10-CM | POA: Diagnosis present

## 2023-04-06 DIAGNOSIS — Z801 Family history of malignant neoplasm of trachea, bronchus and lung: Secondary | ICD-10-CM

## 2023-04-06 DIAGNOSIS — Z9049 Acquired absence of other specified parts of digestive tract: Secondary | ICD-10-CM

## 2023-04-06 DIAGNOSIS — Z7984 Long term (current) use of oral hypoglycemic drugs: Secondary | ICD-10-CM

## 2023-04-06 DIAGNOSIS — Z794 Long term (current) use of insulin: Secondary | ICD-10-CM

## 2023-04-06 DIAGNOSIS — I2699 Other pulmonary embolism without acute cor pulmonale: Secondary | ICD-10-CM

## 2023-04-06 DIAGNOSIS — E785 Hyperlipidemia, unspecified: Secondary | ICD-10-CM | POA: Insufficient documentation

## 2023-04-06 DIAGNOSIS — Z86711 Personal history of pulmonary embolism: Secondary | ICD-10-CM

## 2023-04-06 DIAGNOSIS — Z8673 Personal history of transient ischemic attack (TIA), and cerebral infarction without residual deficits: Secondary | ICD-10-CM

## 2023-04-06 DIAGNOSIS — Z8249 Family history of ischemic heart disease and other diseases of the circulatory system: Secondary | ICD-10-CM

## 2023-04-06 DIAGNOSIS — K219 Gastro-esophageal reflux disease without esophagitis: Secondary | ICD-10-CM | POA: Diagnosis present

## 2023-04-06 DIAGNOSIS — E876 Hypokalemia: Secondary | ICD-10-CM

## 2023-04-06 DIAGNOSIS — I824Y2 Acute embolism and thrombosis of unspecified deep veins of left proximal lower extremity: Secondary | ICD-10-CM

## 2023-04-06 DIAGNOSIS — I428 Other cardiomyopathies: Secondary | ICD-10-CM | POA: Diagnosis present

## 2023-04-06 DIAGNOSIS — Z91148 Patient's other noncompliance with medication regimen for other reason: Secondary | ICD-10-CM

## 2023-04-06 DIAGNOSIS — I110 Hypertensive heart disease with heart failure: Secondary | ICD-10-CM | POA: Diagnosis present

## 2023-04-06 DIAGNOSIS — R233 Spontaneous ecchymoses: Secondary | ICD-10-CM | POA: Diagnosis present

## 2023-04-06 DIAGNOSIS — I160 Hypertensive urgency: Secondary | ICD-10-CM

## 2023-04-06 DIAGNOSIS — I700 Atherosclerosis of aorta: Secondary | ICD-10-CM | POA: Diagnosis present

## 2023-04-06 HISTORY — DX: Cerebral infarction, unspecified: I63.9

## 2023-04-06 HISTORY — DX: Other pulmonary embolism without acute cor pulmonale: I26.99

## 2023-04-06 HISTORY — DX: Acute embolism and thrombosis of unspecified deep veins of unspecified distal lower extremity: I82.4Z9

## 2023-04-06 LAB — COMPREHENSIVE METABOLIC PANEL
ALT: 36 U/L (ref 0–44)
AST: 22 U/L (ref 15–41)
Albumin: 3.6 g/dL (ref 3.5–5.0)
Alkaline Phosphatase: 102 U/L (ref 38–126)
Anion gap: 10 (ref 5–15)
BUN: 20 mg/dL (ref 6–20)
CO2: 22 mmol/L (ref 22–32)
Calcium: 7.9 mg/dL — ABNORMAL LOW (ref 8.9–10.3)
Chloride: 105 mmol/L (ref 98–111)
Creatinine, Ser: 1.1 mg/dL — ABNORMAL HIGH (ref 0.44–1.00)
GFR, Estimated: 58 mL/min — ABNORMAL LOW (ref >60)
Glucose, Bld: 191 mg/dL — ABNORMAL HIGH (ref 70–99)
Potassium: 3.2 mmol/L — ABNORMAL LOW (ref 3.5–5.1)
Sodium: 137 mmol/L (ref 135–145)
Total Bilirubin: 0.5 mg/dL (ref <1.2)
Total Protein: 6.6 g/dL (ref 6.5–8.1)

## 2023-04-06 LAB — CBC
HCT: 39.6 % (ref 36.0–46.0)
Hemoglobin: 13 g/dL (ref 12.0–15.0)
MCH: 27.9 pg (ref 26.0–34.0)
MCHC: 32.8 g/dL (ref 30.0–36.0)
MCV: 85 fL (ref 80.0–100.0)
Platelets: 231 10*3/uL (ref 150–400)
RBC: 4.66 MIL/uL (ref 3.87–5.11)
RDW: 13.4 % (ref 11.5–15.5)
WBC: 9.6 10*3/uL (ref 4.0–10.5)
nRBC: 0 % (ref 0.0–0.2)

## 2023-04-06 LAB — URINALYSIS, ROUTINE W REFLEX MICROSCOPIC
Bilirubin Urine: NEGATIVE
Glucose, UA: 50 mg/dL — AB
Ketones, ur: NEGATIVE mg/dL
Leukocytes,Ua: NEGATIVE
Nitrite: NEGATIVE
Protein, ur: 100 mg/dL — AB
Specific Gravity, Urine: 1.012 (ref 1.005–1.030)
pH: 6 (ref 5.0–8.0)

## 2023-04-06 LAB — LIPASE, BLOOD: Lipase: 35 U/L (ref 11–51)

## 2023-04-06 MED ORDER — HYDRALAZINE HCL 50 MG PO TABS
25.0000 mg | ORAL_TABLET | Freq: Once | ORAL | Status: AC
  Administered 2023-04-06: 25 mg via ORAL
  Filled 2023-04-06: qty 1

## 2023-04-06 MED ORDER — LABETALOL HCL 5 MG/ML IV SOLN
10.0000 mg | Freq: Once | INTRAVENOUS | Status: AC
  Administered 2023-04-06: 10 mg via INTRAVENOUS
  Filled 2023-04-06: qty 4

## 2023-04-06 MED ORDER — ACETAMINOPHEN 500 MG PO TABS
1000.0000 mg | ORAL_TABLET | Freq: Once | ORAL | Status: AC
  Administered 2023-04-06: 1000 mg via ORAL
  Filled 2023-04-06: qty 2

## 2023-04-06 MED ORDER — POTASSIUM CHLORIDE CRYS ER 20 MEQ PO TBCR
40.0000 meq | EXTENDED_RELEASE_TABLET | Freq: Once | ORAL | Status: AC
  Administered 2023-04-06: 40 meq via ORAL
  Filled 2023-04-06: qty 2

## 2023-04-06 MED ORDER — IOHEXOL 350 MG/ML SOLN
75.0000 mL | Freq: Once | INTRAVENOUS | Status: AC | PRN
  Administered 2023-04-06: 75 mL via INTRAVENOUS

## 2023-04-06 MED ORDER — SACUBITRIL-VALSARTAN 97-103 MG PO TABS
1.0000 | ORAL_TABLET | Freq: Two times a day (BID) | ORAL | Status: DC
  Administered 2023-04-06 – 2023-04-09 (×6): 1 via ORAL
  Filled 2023-04-06 (×7): qty 1

## 2023-04-06 NOTE — ED Notes (Addendum)
Pt monitor showing high BP of 209/138. MD made aware.

## 2023-04-06 NOTE — ED Triage Notes (Signed)
Pt comes with c/o abdominal pain and just not feeling well. Pt states this started today. Pt states mid belly pain. Pt denies any N/V or urinary symptoms.   Family states pt has been talking weird and that is why she brought her over.   Pt denies any recent falls.  Pt aware of location but confused on day of week.   Pt went to Fire station and CBG was 240 something per daughter.

## 2023-04-06 NOTE — ED Provider Notes (Signed)
[facility] Provider Note    Event Date/Time   First MD Initiated Contact with Patient 04/06/23 1507     (approximate)   History   Chief Complaint: Abdominal Pain   HPI  Erin Hayes is a 57 y.o. female with a history of hypertension, hyperlipidemia, diabetes who comes to the ED due to confusion that started abruptly at 12:30 PM today, lasted about an hour, and then has resolved.  Patient feels back to normal currently.  Denies any preceding illness or trauma.  Denies dysuria frequency urgency vomiting or pain.  No headaches or vision changes.  No neck pain or fever.  Denies paresthesias or motor weakness or change in balance or coordination.  Patient does endorse feeling dizzy afterward, which has now resolved as well.  In triage patient had reported malaise and some abdominal discomfort, which she now denies.  Daughter at bedside was there with the patient, denies slurred speech or facial droop or other apparent neurologic deficits.  Reports that speech was clear but confused          Physical Exam   Triage Vital Signs: ED Triage Vitals  Encounter Vitals Group     BP 04/06/23 1417 (!) 122/92     Systolic BP Percentile --      Diastolic BP Percentile --      Pulse Rate 04/06/23 1417 82     Resp 04/06/23 1417 19     Temp 04/06/23 1417 98.4 F (36.9 C)     Temp Source 04/06/23 1417 Oral     SpO2 04/06/23 1417 100 %     Weight --      Height --      Head Circumference --      Peak Flow --      Pain Score 04/06/23 1426 4     Pain Loc --      Pain Education --      Exclude from Growth Chart --     Most recent vital signs: Vitals:   04/06/23 2230 04/06/23 2235  BP: (!) 182/102 (!) 176/106  Pulse:  88  Resp:  (!) 21  Temp:    SpO2:  95%    General: Awake, no distress.  CV:  Good peripheral perfusion.  Regular rate and rhythm Resp:  Normal effort.  Clear to auscultation bilaterally Abd:  No distention.  Soft  nontender Other:  Cranial nerves III through XII intact.  Moist oral mucosa.  No lower extremity edema. No motor drift, symmetric sensation.  Normal cerebellar function.  Normal speech and language. NIH stroke scale 0   ED Results / Procedures / Treatments   Labs (all labs ordered are listed, but only abnormal results are displayed) Labs Reviewed  COMPREHENSIVE METABOLIC PANEL - Abnormal; Notable for the following components:      Result Value   Potassium 3.2 (*)    Glucose, Bld 191 (*)    Creatinine, Ser 1.10 (*)    Calcium 7.9 (*)    GFR, Estimated 58 (*)    All other components within normal limits  URINALYSIS, ROUTINE W REFLEX MICROSCOPIC - Abnormal; Notable for the following components:   Color, Urine YELLOW (*)    APPearance CLEAR (*)    Glucose, UA 50 (*)    Hgb urine dipstick SMALL (*)    Protein, ur 100 (*)    Bacteria, UA RARE (*)    All other components within normal limits  LIPASE, BLOOD  CBC  EKG Interpreted by me Sinus rhythm rate of 94.  Right axis, left bundle branch block.  No acute ischemic changes.   RADIOLOGY MRI brain interpreted by me, shows small area of infarct in the left parietal brain.  Radiology report reviewed   PROCEDURES:  .Critical Care  Performed by: Sharman Cheek, MD Authorized by: Sharman Cheek, MD   Critical care provider statement:    Critical care time (minutes):  35   Critical care time was exclusive of:  Separately billable procedures and treating other patients   Critical care was necessary to treat or prevent imminent or life-threatening deterioration of the following conditions:  CNS failure or compromise and circulatory failure   Critical care was time spent personally by me on the following activities:  Development of treatment plan with patient or surrogate, discussions with consultants, evaluation of patient's response to treatment, examination of patient, obtaining history from patient or surrogate, ordering  and performing treatments and interventions, ordering and review of laboratory studies, ordering and review of radiographic studies, pulse oximetry, re-evaluation of patient's condition and review of old charts   Care discussed with: admitting provider      MEDICATIONS ORDERED IN ED: Medications  sacubitril-valsartan (ENTRESTO) 97-103 mg per tablet (1 tablet Oral Given 04/06/23 2020)  potassium chloride SA (KLOR-CON M) CR tablet 40 mEq (40 mEq Oral Given 04/06/23 1538)  acetaminophen (TYLENOL) tablet 1,000 mg (1,000 mg Oral Given 04/06/23 1813)  labetalol (NORMODYNE) injection 10 mg (10 mg Intravenous Given 04/06/23 1849)  hydrALAZINE (APRESOLINE) tablet 25 mg (25 mg Oral Given 04/06/23 2055)  iohexol (OMNIPAQUE) 350 MG/ML injection 75 mL (75 mLs Intravenous Contrast Given 04/06/23 2149)     IMPRESSION / MDM / ASSESSMENT AND PLAN / ED COURSE  I reviewed the triage vital signs and the nursing notes.  DDx: Dehydration, orthostatic symptoms, AKI, anemia, UTI, TIA, CVA  Patient's presentation is most consistent with acute presentation with potential threat to life or bodily function.  Patient presents with a brief episode of confusion without focal neurologic deficits which has resolved.  No other specific symptoms to localize a source of illness.  Serum labs unremarkable.  Will obtain urinalysis and MRI brain due to age and risk factors for atherosclerotic disease.   Clinical Course as of 04/06/23 2253  Sat Apr 06, 2023  1831 MRI d/w radiology who notes + for small infarct in L insula/parietal lobe. [PS]  1859 Case discussed with neurology who recommends obtaining CT angiogram head neck now to ensure there is not a critical vascular lesion due to potential explanation of symptoms being fluent aphasia representing an at risk left MCA territory.  If no concern for LVO, can admit at [facility].  Patient has not had any of her blood pressure medicine today, last blood pressure 220/130.  10 mg IV  labetalol ordered, will also order her hydralazine and Entresto which are overdue. [PS]    Clinical Course User Index [PS] Sharman Cheek, MD    ----------------------------------------- 10:51 PM on 04/06/2023 ----------------------------------------- Still awaiting CT angio read, after which patient will be ready for disposition, admission to [facility] versus transfer to [facility] depending on any findings for LVO.   FINAL CLINICAL IMPRESSION(S) / ED DIAGNOSES   Final diagnoses:  Acute ischemic stroke (HCC)  Chronic atrial fibrillation (HCC)  Type 2 diabetes mellitus without complication, without long-term current use of insulin (HCC)     Rx / DC Orders   ED Discharge Orders     None  Note:  This document was prepared using Dragon voice recognition software and may include unintentional dictation errors.   Sharman Cheek, MD 04/06/23 2253

## 2023-04-07 ENCOUNTER — Inpatient Hospital Stay: Payer: 59

## 2023-04-07 ENCOUNTER — Inpatient Hospital Stay (HOSPITAL_COMMUNITY)
Admit: 2023-04-07 | Discharge: 2023-04-07 | Disposition: A | Discharge status: Home / Self Care | Payer: 59 | Attending: Family Medicine | Admitting: Family Medicine

## 2023-04-07 ENCOUNTER — Emergency Department: Payer: 59

## 2023-04-07 DIAGNOSIS — E7800 Pure hypercholesterolemia, unspecified: Secondary | ICD-10-CM | POA: Diagnosis present

## 2023-04-07 DIAGNOSIS — I619 Nontraumatic intracerebral hemorrhage, unspecified: Secondary | ICD-10-CM | POA: Diagnosis present

## 2023-04-07 DIAGNOSIS — I63412 Cerebral infarction due to embolism of left middle cerebral artery: Secondary | ICD-10-CM | POA: Diagnosis present

## 2023-04-07 DIAGNOSIS — I2699 Other pulmonary embolism without acute cor pulmonale: Secondary | ICD-10-CM

## 2023-04-07 DIAGNOSIS — Z86718 Personal history of other venous thrombosis and embolism: Secondary | ICD-10-CM

## 2023-04-07 DIAGNOSIS — E1169 Type 2 diabetes mellitus with other specified complication: Secondary | ICD-10-CM | POA: Insufficient documentation

## 2023-04-07 DIAGNOSIS — Z7901 Long term (current) use of anticoagulants: Secondary | ICD-10-CM | POA: Diagnosis not present

## 2023-04-07 DIAGNOSIS — E119 Type 2 diabetes mellitus without complications: Secondary | ICD-10-CM | POA: Diagnosis present

## 2023-04-07 DIAGNOSIS — I3139 Other pericardial effusion (noninflammatory): Secondary | ICD-10-CM | POA: Diagnosis not present

## 2023-04-07 DIAGNOSIS — I160 Hypertensive urgency: Secondary | ICD-10-CM | POA: Diagnosis present

## 2023-04-07 DIAGNOSIS — Z91148 Patient's other noncompliance with medication regimen for other reason: Secondary | ICD-10-CM | POA: Diagnosis not present

## 2023-04-07 DIAGNOSIS — I700 Atherosclerosis of aorta: Secondary | ICD-10-CM | POA: Diagnosis present

## 2023-04-07 DIAGNOSIS — E876 Hypokalemia: Secondary | ICD-10-CM | POA: Diagnosis present

## 2023-04-07 DIAGNOSIS — I5042 Chronic combined systolic (congestive) and diastolic (congestive) heart failure: Secondary | ICD-10-CM

## 2023-04-07 DIAGNOSIS — Z794 Long term (current) use of insulin: Secondary | ICD-10-CM | POA: Diagnosis not present

## 2023-04-07 DIAGNOSIS — R233 Spontaneous ecchymoses: Secondary | ICD-10-CM | POA: Diagnosis present

## 2023-04-07 DIAGNOSIS — I482 Chronic atrial fibrillation, unspecified: Secondary | ICD-10-CM | POA: Diagnosis present

## 2023-04-07 DIAGNOSIS — N179 Acute kidney failure, unspecified: Secondary | ICD-10-CM | POA: Diagnosis not present

## 2023-04-07 DIAGNOSIS — I6389 Other cerebral infarction: Secondary | ICD-10-CM

## 2023-04-07 DIAGNOSIS — I428 Other cardiomyopathies: Secondary | ICD-10-CM | POA: Diagnosis present

## 2023-04-07 DIAGNOSIS — I82402 Acute embolism and thrombosis of unspecified deep veins of left lower extremity: Secondary | ICD-10-CM | POA: Diagnosis present

## 2023-04-07 DIAGNOSIS — I639 Cerebral infarction, unspecified: Secondary | ICD-10-CM | POA: Diagnosis not present

## 2023-04-07 DIAGNOSIS — I110 Hypertensive heart disease with heart failure: Secondary | ICD-10-CM | POA: Diagnosis present

## 2023-04-07 DIAGNOSIS — I5022 Chronic systolic (congestive) heart failure: Secondary | ICD-10-CM | POA: Diagnosis not present

## 2023-04-07 DIAGNOSIS — R29700 NIHSS score 0: Secondary | ICD-10-CM | POA: Diagnosis present

## 2023-04-07 DIAGNOSIS — I447 Left bundle-branch block, unspecified: Secondary | ICD-10-CM | POA: Diagnosis present

## 2023-04-07 DIAGNOSIS — I2694 Multiple subsegmental pulmonary emboli without acute cor pulmonale: Secondary | ICD-10-CM | POA: Diagnosis present

## 2023-04-07 DIAGNOSIS — R4701 Aphasia: Secondary | ICD-10-CM | POA: Diagnosis present

## 2023-04-07 DIAGNOSIS — K219 Gastro-esophageal reflux disease without esophagitis: Secondary | ICD-10-CM | POA: Diagnosis present

## 2023-04-07 DIAGNOSIS — Z7984 Long term (current) use of oral hypoglycemic drugs: Secondary | ICD-10-CM | POA: Diagnosis not present

## 2023-04-07 DIAGNOSIS — E785 Hyperlipidemia, unspecified: Secondary | ICD-10-CM | POA: Insufficient documentation

## 2023-04-07 LAB — LIPID PANEL
Cholesterol: 217 mg/dL — ABNORMAL HIGH (ref 0–200)
HDL: 65 mg/dL (ref >40)
LDL Cholesterol: 131 mg/dL — ABNORMAL HIGH (ref 0–99)
Total CHOL/HDL Ratio: 3.3 {ratio}
Triglycerides: 106 mg/dL (ref <150)
VLDL: 21 mg/dL (ref 0–40)

## 2023-04-07 LAB — CBC
HCT: 38 % (ref 36.0–46.0)
Hemoglobin: 12.3 g/dL (ref 12.0–15.0)
MCH: 27.8 pg (ref 26.0–34.0)
MCHC: 32.4 g/dL (ref 30.0–36.0)
MCV: 86 fL (ref 80.0–100.0)
Platelets: 331 10*3/uL (ref 150–400)
RBC: 4.42 MIL/uL (ref 3.87–5.11)
RDW: 13.7 % (ref 11.5–15.5)
WBC: 10.3 10*3/uL (ref 4.0–10.5)
nRBC: 0 % (ref 0.0–0.2)

## 2023-04-07 LAB — ECHOCARDIOGRAM COMPLETE BUBBLE STUDY
AR max vel: 1.66 cm2
AV Peak grad: 8.3 mm[Hg]
Ao pk vel: 1.44 m/s
Calc EF: 44.2 %
Single Plane A2C EF: 42.5 %
Single Plane A4C EF: 45.6 %

## 2023-04-07 LAB — BASIC METABOLIC PANEL
Anion gap: 10 (ref 5–15)
BUN: 19 mg/dL (ref 6–20)
CO2: 24 mmol/L (ref 22–32)
Calcium: 8 mg/dL — ABNORMAL LOW (ref 8.9–10.3)
Chloride: 103 mmol/L (ref 98–111)
Creatinine, Ser: 1.07 mg/dL — ABNORMAL HIGH (ref 0.44–1.00)
GFR, Estimated: 60 mL/min — ABNORMAL LOW (ref >60)
Glucose, Bld: 196 mg/dL — ABNORMAL HIGH (ref 70–99)
Potassium: 4.1 mmol/L (ref 3.5–5.1)
Sodium: 137 mmol/L (ref 135–145)

## 2023-04-07 LAB — CBG MONITORING, ED
Glucose-Capillary: 153 mg/dL — ABNORMAL HIGH (ref 70–99)
Glucose-Capillary: 139 mg/dL — ABNORMAL HIGH (ref 70–99)

## 2023-04-07 LAB — APTT: aPTT: 26 s (ref 24–36)

## 2023-04-07 LAB — GLUCOSE, CAPILLARY: Glucose-Capillary: 145 mg/dL — ABNORMAL HIGH (ref 70–99)

## 2023-04-07 LAB — HEPARIN LEVEL (UNFRACTIONATED)
Heparin Unfractionated: <0.1 [IU]/mL — ABNORMAL LOW (ref 0.30–0.70)
Heparin Unfractionated: 0.12 [IU]/mL — ABNORMAL LOW (ref 0.30–0.70)
Heparin Unfractionated: 0.26 [IU]/mL — ABNORMAL LOW (ref 0.30–0.70)

## 2023-04-07 LAB — PROTIME-INR
INR: 1.1 (ref 0.8–1.2)
Prothrombin Time: 14.2 s (ref 11.4–15.2)

## 2023-04-07 LAB — HIV ANTIBODY (ROUTINE TESTING W REFLEX): HIV Screen 4th Generation wRfx: NONREACTIVE

## 2023-04-07 MED ORDER — ACETAMINOPHEN 650 MG RE SUPP: 650.0000 mg | Freq: Four times a day (QID) | RECTAL | Status: DC | PRN

## 2023-04-07 MED ORDER — TRAZODONE HCL 50 MG PO TABS
25.0000 mg | ORAL_TABLET | Freq: Every evening | ORAL | Status: DC | PRN
  Filled 2023-04-07: qty 1

## 2023-04-07 MED ORDER — SODIUM CHLORIDE 0.9 % IV SOLN: INTRAVENOUS | Status: DC

## 2023-04-07 MED ORDER — ROSUVASTATIN CALCIUM 10 MG PO TABS
20.0000 mg | ORAL_TABLET | Freq: Every day | ORAL | Status: DC
  Administered 2023-04-07 – 2023-04-09 (×3): 20 mg via ORAL
  Filled 2023-04-07: qty 1
  Filled 2023-04-07 (×2): qty 2

## 2023-04-07 MED ORDER — ALBUTEROL SULFATE HFA 108 (90 BASE) MCG/ACT IN AERS: 2.0000 | INHALATION_SPRAY | RESPIRATORY_TRACT | Status: DC | PRN

## 2023-04-07 MED ORDER — HEPARIN SODIUM (PORCINE) 5000 UNIT/ML IJ SOLN: 60.0000 [IU]/kg | Freq: Once | INTRAMUSCULAR | Status: DC

## 2023-04-07 MED ORDER — HEPARIN BOLUS VIA INFUSION
3300.0000 [IU] | Freq: Once | INTRAVENOUS | Status: DC
  Filled 2023-04-07: qty 3300

## 2023-04-07 MED ORDER — HEPARIN (PORCINE) 25000 UT/250ML-% IV SOLN
950.0000 [IU]/h | INTRAVENOUS | Status: DC
  Administered 2023-04-07: 750 [IU]/h via INTRAVENOUS
  Administered 2023-04-08: 950 [IU]/h via INTRAVENOUS
  Administered 2023-04-08: 1000 [IU]/h via INTRAVENOUS
  Filled 2023-04-07 (×2): qty 250

## 2023-04-07 MED ORDER — INSULIN ASPART 100 UNIT/ML IJ SOLN
0.0000 [IU] | Freq: Three times a day (TID) | INTRAMUSCULAR | Status: DC
  Administered 2023-04-07 – 2023-04-08 (×3): 1 [IU] via SUBCUTANEOUS
  Administered 2023-04-08: 2 [IU] via SUBCUTANEOUS
  Administered 2023-04-08 – 2023-04-09 (×3): 1 [IU] via SUBCUTANEOUS
  Filled 2023-04-07 (×7): qty 1

## 2023-04-07 MED ORDER — ACETAMINOPHEN 325 MG PO TABS
650.0000 mg | ORAL_TABLET | Freq: Four times a day (QID) | ORAL | Status: DC | PRN
  Administered 2023-04-08 – 2023-04-09 (×2): 650 mg via ORAL
  Filled 2023-04-07 (×2): qty 2

## 2023-04-07 MED ORDER — SACUBITRIL-VALSARTAN 97-103 MG PO TABS
1.0000 | ORAL_TABLET | Freq: Two times a day (BID) | ORAL | Status: DC
  Filled 2023-04-07: qty 1

## 2023-04-07 MED ORDER — ADULT MULTIVITAMIN W/MINERALS CH
1.0000 | ORAL_TABLET | Freq: Every day | ORAL | Status: DC
  Administered 2023-04-07 – 2023-04-09 (×3): 1 via ORAL
  Filled 2023-04-07 (×3): qty 1

## 2023-04-07 MED ORDER — ONDANSETRON HCL 4 MG PO TABS: 4.0000 mg | ORAL_TABLET | Freq: Four times a day (QID) | ORAL | Status: DC | PRN

## 2023-04-07 MED ORDER — FUROSEMIDE 40 MG PO TABS
40.0000 mg | ORAL_TABLET | Freq: Every day | ORAL | Status: DC
  Administered 2023-04-07: 40 mg via ORAL
  Filled 2023-04-07: qty 1

## 2023-04-07 MED ORDER — GLIPIZIDE ER 5 MG PO TB24
5.0000 mg | ORAL_TABLET | Freq: Every day | ORAL | Status: DC
  Administered 2023-04-07: 5 mg via ORAL
  Filled 2023-04-07: qty 1

## 2023-04-07 MED ORDER — IOHEXOL 350 MG/ML SOLN
75.0000 mL | Freq: Once | INTRAVENOUS | Status: AC | PRN
  Administered 2023-04-07: 75 mL via INTRAVENOUS

## 2023-04-07 MED ORDER — ALBUTEROL SULFATE (2.5 MG/3ML) 0.083% IN NEBU: 2.5000 mg | INHALATION_SOLUTION | RESPIRATORY_TRACT | Status: DC | PRN

## 2023-04-07 MED ORDER — STROKE: EARLY STAGES OF RECOVERY BOOK: Freq: Once | Status: AC

## 2023-04-07 MED ORDER — SPIRONOLACTONE 25 MG PO TABS
25.0000 mg | ORAL_TABLET | Freq: Every day | ORAL | Status: DC
  Administered 2023-04-07: 25 mg via ORAL
  Filled 2023-04-07: qty 1

## 2023-04-07 MED ORDER — MAGNESIUM HYDROXIDE 400 MG/5ML PO SUSP: 30.0000 mL | Freq: Every day | ORAL | Status: DC | PRN

## 2023-04-07 MED ORDER — ONDANSETRON HCL 4 MG/2ML IJ SOLN: 4.0000 mg | Freq: Four times a day (QID) | INTRAMUSCULAR | Status: DC | PRN

## 2023-04-07 MED ORDER — METOPROLOL SUCCINATE ER 25 MG PO TB24
25.0000 mg | ORAL_TABLET | Freq: Every day | ORAL | Status: DC
  Administered 2023-04-07 – 2023-04-09 (×3): 25 mg via ORAL
  Filled 2023-04-07 (×3): qty 1

## 2023-04-07 MED ORDER — HYDRALAZINE HCL 25 MG PO TABS
25.0000 mg | ORAL_TABLET | Freq: Three times a day (TID) | ORAL | Status: DC
  Administered 2023-04-07 – 2023-04-09 (×7): 25 mg via ORAL
  Filled 2023-04-07 (×7): qty 1

## 2023-04-07 MED ORDER — HEPARIN (PORCINE) 25000 UT/250ML-% IV SOLN: 14.0000 [IU]/kg/h | INTRAVENOUS | Status: DC

## 2023-04-07 NOTE — H&P (Signed)
[place]   PATIENT NAME: Erin Hayes    MR#:  [id number]  DATE OF BIRTH:  1965/07/14  DATE OF ADMISSION:  04/06/2023  PRIMARY CARE PHYSICIAN: Lorre Munroe, NP   Patient is coming from: Home.  REQUESTING/REFERRING PHYSICIAN: Chiquita Loth, MD  CHIEF COMPLAINT:  Altered mental status with confusion   HISTORY OF PRESENT ILLNESS:  Erin Hayes is a 57 y.o. female with medical history significant for chronic combined systolic and diastolic CHF, GERD, hypertension, dyslipidemia and nonischemic cardiomyopathy, who presented to the emergency room with acute onset of altered mental status with confusion that started around 12:30 PM and lasted an hour then resolved.  No headache but has been having dizziness has now resolved.  She denied any focal muscle weakness or paresthesias or gait abnormality.  No dysuria, oliguria or hematuria or flank pain.  No chest pain or palpitations.  No cough or wheezing or hemoptysis.  She is in the left leg boot for left lower extremity fracture a few months ago.  She denies any current left leg pain.  No bleeding diathesis.  ED Course: When the patient came to the ER, BP was 122/92 and later 184/117 with otherwise normal vital signs and later on respiratory rate was 27.  Labs revealed hypokalemia 3.2 and blood glucose 191 with a calcium 7.9 with otherwise unremarkable CMP.  CBC was within normal. EKG as reviewed by me : EKG showed normal sinus rhythm with a rate of 94 with IVCD.  With T wave inversion laterally and inferiorly. Imaging: CT HEAD:   1. No acute intracranial abnormality. 2. Previously identified acute left MCA distribution infarct not well visualized by CT. No acute intracranial hemorrhage. 3. Underlying moderate chronic microvascular ischemic disease with multiple remote infarcts as above.   CTA HEAD AND NECK:   1. Acute distal left M4 occlusion, corresponding with the previously identified small left MCA distribution infarct. 2.  Fetal type origin of the PCAs with overall diminutive vertebrobasilar system. 3. Diffuse tortuosity of the major arterial vasculature of the head and neck, suggesting chronic underlying hypertension. 4. Acute pulmonary embolism within the right main and partially visualized segmental pulmonary arteries. Further evaluation with dedicated CTA of the chest recommended for complete evaluation. 5. 1.5 cm nodule at the thyroid isthmus. Further evaluation with dedicated thyroid ultrasound recommended. This could be performed in mild emergent outpatient basis. (Ref: J Am Coll Radiol. 2015 Feb;12(2): 143-50).  Brain MRI without contrast revealed the following: 1. Small acute cortical infarct within the left insula and adjacent left parietal lobe. Petechial hemorrhage also present at this site. 2. Background chronic small vessel ischemic disease and chronic infarcts as outlined. 3. Chronic microhemorrhages within the supratentorial and infratentorial brain, in a distribution suggesting sequelae of chronic hypertensive microangiopathy.  Chest CTA revealed the following: 1. Marked severity right-sided pulmonary embolism with extension to involve multiple upper lobe branches. 2. Moderate to large pericardial effusion. 3. Thick curvilinear areas of low attenuation within the lumen of the left ventricle, which may represent areas of thrombus. Further evaluation with an echocardiogram and cardiology consultation is recommended. 4. Mild left lower lobe and right basilar atelectasis. 5. Evidence of prior cholecystectomy. 6. Aortic atherosclerosis.  The patient was given 10 mg of IV labetalol, 25 mg of p.o. hydralazine, 40 mill Cabbell p.o. potassium chloride, Entresto and 1 g of p.o. Tylenol.  She was initially started on IV heparin drip.  Phone vascular surgery consultation was obtained and Dr. Myra Gianotti who  did not think the patient needs urgent IVC filter even if she has  lower extremity DVT.  She  will be admitted to a progressive unit bed for further evaluation and management.   PAST MEDICAL HISTORY:   Past Medical History:  Diagnosis Date   Carpal tunnel syndrome of right wrist    Chronic combined systolic (congestive) and diastolic (congestive) heart failure (HCC)    a. 03/2017 Echo: EF 20-25%, diff HK, Gr2 DD; b. 09/2017 Echo: EF 20-25%, diff HK, Gr2 DD.   Diabetes (HCC)    GERD (gastroesophageal reflux disease)    HLD (hyperlipidemia)    Hypertension    NICM (nonischemic cardiomyopathy) (HCC)    a. 03/2017 Echo: EF 20-25%, diff HK, Gr2 DD, mild to mod MR, nl RV fxn;  b. 03/2017 Cath: mild nonobs dzs, EF 25%; c. 09/2017 Echo: EF 20-25%, diff HK. Gr2 DD. Mild MR. Mildly reduced RV fxn.   Pleural effusion, left    a. 03/2017 s/p thoracentesis-->300 ml withdrawn--transudative.   Pneumonia 02/19/2017    PAST SURGICAL HISTORY:   Past Surgical History:  Procedure Laterality Date   COLONOSCOPY WITH PROPOFOL N/A 01/21/2017   Procedure: COLONOSCOPY WITH PROPOFOL;  Surgeon: Wyline Mood, MD;  Location: [facility] ENDOSCOPY;  Service: Gastroenterology;  Laterality: N/A;   ESOPHAGOGASTRODUODENOSCOPY (EGD) WITH PROPOFOL N/A 01/21/2017   Procedure: ESOPHAGOGASTRODUODENOSCOPY (EGD) WITH PROPOFOL;  Surgeon: Wyline Mood, MD;  Location: [facility] ENDOSCOPY;  Service: Gastroenterology;  Laterality: N/A;   FLEXIBLE SIGMOIDOSCOPY N/A 11/04/2016   Procedure: FLEXIBLE SIGMOIDOSCOPY;  Surgeon: Charlott Rakes, MD;  Location: [facility] ENDOSCOPY;  Service: Endoscopy;  Laterality: N/A;   HIP PINNING,CANNULATED Left 06/19/2021   Procedure: CANNULATED HIP PINNING;  Surgeon: Ross Marcus, MD;  Location: [facility] ORS;  Service: Orthopedics;  Laterality: Left;   LEFT HEART CATH AND CORONARY ANGIOGRAPHY N/A 04/05/2017   Procedure: LEFT HEART CATH AND CORONARY ANGIOGRAPHY;  Surgeon: Iran Ouch, MD;  Location: [facility] INVASIVE CV LAB;  Service: Cardiovascular;  Laterality: N/A;   LITHOTRIPSY      SOCIAL HISTORY:    Social History   Tobacco Use   Smoking status: Never   Smokeless tobacco: Never  Substance Use Topics   Alcohol use: No    FAMILY HISTORY:   Family History  Problem Relation Age of Onset   Lung cancer Mother    Hypertension Father    CAD Father        a. MI age 25   Heart disease Father    COPD Neg Hx    Diabetes Mellitus II Neg Hx     DRUG ALLERGIES:  No Known Allergies  REVIEW OF SYSTEMS:   ROS As per history of present illness. All pertinent systems were reviewed above. Constitutional, HEENT, cardiovascular, respiratory, GI, GU, musculoskeletal, neuro, psychiatric, endocrine, integumentary and hematologic systems were reviewed and are otherwise negative/unremarkable except for positive findings mentioned above in the HPI.   MEDICATIONS AT HOME:   Prior to Admission medications   Medication Sig Start Date End Date Taking? Authorizing Provider  albuterol (VENTOLIN HFA) 108 (90 Base) MCG/ACT inhaler  07/21/21  Yes [provider]  apixaban (ELIQUIS) 5 MG TABS tablet Take 5 mg by mouth 2 (two) times daily. 12/04/22 04/07/23 Yes [provider]  furosemide (LASIX) 40 MG tablet Take 1 tablet (40 mg total) by mouth daily. 03/19/23 06/17/23 Yes Gollan, Tollie Pizza, MD  glipiZIDE (GLUCOTROL XL) 5 MG 24 hr tablet Take 1 tablet (5 mg total) by mouth daily with breakfast. 12/17/22  Yes  Lorre Munroe, NP  hydrALAZINE (APRESOLINE) 25 MG tablet Take 1 tablet (25 mg total) by mouth 3 (three) times daily. 03/19/23  Yes Antonieta Iba, MD  loperamide HCl (IMODIUM) 1 MG/7.5ML solution Take 15 mL (2 mg total) by mouth four (4) times a day as needed for diarrhea.   Yes [provider]  metoprolol succinate (TOPROL-XL) 25 MG 24 hr tablet Take 1 tablet (25 mg total) by mouth daily. 01/31/23  Yes Lorre Munroe, NP  rosuvastatin (CRESTOR) 20 MG tablet Take 1 tablet (20 mg total) by mouth daily. 01/31/23  Yes Baity, Salvadore Oxford, NP  sacubitril-valsartan (ENTRESTO) 97-103  MG Take 1 tablet by mouth 2 (two) times daily. 01/31/23  Yes Lorre Munroe, NP  spironolactone (ALDACTONE) 25 MG tablet Take 1 tablet (25 mg total) by mouth daily. 01/31/23  Yes Lorre Munroe, NP  Multiple Vitamin (MULTIVITAMIN WITH MINERALS) TABS tablet Take 1 tablet by mouth daily. 06/23/21   Rodolph Bong, MD      VITAL SIGNS:  Blood pressure (!) 180/110, pulse 92, temperature 98.1 F (36.7 C), resp. rate (!) 21, weight 54.4 kg, SpO2 99%.  PHYSICAL EXAMINATION:  Physical Exam  GENERAL:  57 y.o.-year-old female patient lying in the bed with no acute distress.  EYES: Pupils equal, round, reactive to light and accommodation. No scleral icterus. Extraocular muscles intact.  HEENT: Head atraumatic, normocephalic. Oropharynx and nasopharynx clear.  NECK:  Supple, no jugular venous distention. No thyroid enlargement, no tenderness.  LUNGS: Normal breath sounds bilaterally, no wheezing, rales,rhonchi or crepitation. No use of accessory muscles of respiration.  CARDIOVASCULAR: Regular rate and rhythm, S1, S2 normal. No murmurs, rubs, or gallops.  ABDOMEN: Soft, nondistended, nontender. Bowel sounds present. No organomegaly or mass.  EXTREMITIES: No pedal edema, cyanosis, or clubbing.  NEUROLOGIC: Cranial nerves II through XII are intact. Muscle strength 5/5 in all extremities. Sensation intact. Gait not checked.  PSYCHIATRIC: The patient is alert and oriented x 3.  Normal affect and good eye contact. SKIN: No obvious rash, lesion, or ulcer.   LABORATORY PANEL:   CBC Recent Labs  Lab 04/07/23 0447  WBC 10.3  HGB 12.3  HCT 38.0  PLT 331   ------------------------------------------------------------------------------------------------------------------  Chemistries  Recent Labs  Lab 04/06/23 1445 04/07/23 0447  NA 137 137  K 3.2* 4.1  CL 105 103  CO2 22 24  GLUCOSE 191* 196*  BUN 20 19  CREATININE 1.10* 1.07*  CALCIUM 7.9* 8.0*  AST 22  --   ALT 36  --   ALKPHOS 102   --   BILITOT 0.5  --    ------------------------------------------------------------------------------------------------------------------  Cardiac Enzymes No results for input(s): "TROPONINI" in the last 168 hours. ------------------------------------------------------------------------------------------------------------------  RADIOLOGY:  US Venous Img Lower Bilateral (DVT)  Result Date: 04/07/2023 CLINICAL DATA:  Bilateral lower extremity swelling. EXAM: BILATERAL LOWER EXTREMITY VENOUS DOPPLER ULTRASOUND TECHNIQUE: Gray-scale sonography with graded compression, as well as color Doppler and duplex ultrasound were performed to evaluate the lower extremity deep venous systems from the level of the common femoral vein and including the common femoral, femoral, profunda femoral, popliteal and calf veins including the posterior tibial, peroneal and gastrocnemius veins when visible. The superficial great saphenous vein was also interrogated. Spectral Doppler was utilized to evaluate flow at rest and with distal augmentation maneuvers in the common femoral, femoral and popliteal veins. COMPARISON:  None Available. FINDINGS: RIGHT LOWER EXTREMITY Common Femoral Vein: No evidence of thrombus. Normal compressibility, respiratory phasicity and response to  augmentation. Saphenofemoral Junction: No evidence of thrombus. Normal compressibility and flow on color Doppler imaging. Profunda Femoral Vein: No evidence of thrombus. Normal compressibility and flow on color Doppler imaging. Femoral Vein: No evidence of thrombus. Normal compressibility, respiratory phasicity and response to augmentation. Popliteal Vein: No evidence of thrombus. Normal compressibility, respiratory phasicity and response to augmentation. Calf Veins: No evidence of thrombus. Normal compressibility and flow on color Doppler imaging. Superficial Great Saphenous Vein: No evidence of thrombus. Normal compressibility. Venous Reflux:  None. Other  Findings:  None. LEFT LOWER EXTREMITY Common Femoral Vein: No evidence of thrombus. Normal compressibility, respiratory phasicity and response to augmentation. Saphenofemoral Junction: No evidence of thrombus. Normal compressibility and flow on color Doppler imaging. Profunda Femoral Vein: No evidence of thrombus. Normal compressibility and flow on color Doppler imaging. Femoral Vein: Evidence of occlusive thrombus with abnormal compressibility, respiratory phasicity and response to augmentation. Popliteal Vein: No evidence of thrombus. Normal compressibility, respiratory phasicity and response to augmentation. Calf Veins: Evidence of occlusive thrombus within the LEFT posterior tibial vein and LEFT peroneal vein with abnormal compressibility and flow on color Doppler imaging. Superficial Great Saphenous Vein: No evidence of thrombus. Normal compressibility. Venous Reflux:  None. Other Findings:  None. IMPRESSION: 1. Evidence of occlusive DVT within the LEFT femoral vein, LEFT posterior tibial vein and LEFT peroneal vein. 2. No evidence of DVT within the RIGHT lower extremity. Electronically Signed   By: Aram Candela M.D.   On: 04/07/2023 04:02   CT Angio Chest PE W/Cm &/Or Wo Cm  Result Date: 04/07/2023 CLINICAL DATA:  Altered mental status. EXAM: CT ANGIOGRAPHY CHEST WITH CONTRAST TECHNIQUE: Multidetector CT imaging of the chest was performed using the standard protocol during bolus administration of intravenous contrast. Multiplanar CT image reconstructions and MIPs were obtained to evaluate the vascular anatomy. RADIATION DOSE REDUCTION: This exam was performed according to the departmental dose-optimization program which includes automated exposure control, adjustment of the mA and/or kV according to patient size and/or use of iterative reconstruction technique. CONTRAST:  75mL OMNIPAQUE IOHEXOL 350 MG/ML SOLN COMPARISON:  April 04, 2017 FINDINGS: Cardiovascular: There is mild calcification of the  proximal portion of the descending thoracic aorta, without evidence of aortic aneurysm. Satisfactory opacification of the pulmonary arteries to the segmental level. Marked severity intraluminal low attenuation is seen within the distal aspect of the right pulmonary artery with extension to involve multiple upper lobe branches. There is left ventricular hypertrophy without evidence of right heart strain. Thick curvilinear areas of low attenuation are seen within the lumen of the left ventricle. A moderate to large pericardial effusion is seen (approximately 9.9 mm in AP thickness). Mediastinum/Nodes: No enlarged mediastinal, hilar, or axillary lymph nodes. Thyroid gland, trachea, and esophagus demonstrate no significant findings. Lungs/Pleura: A mild atelectatic changes are seen within the anterior aspect of the left lower lobe and posteromedial aspect of the right lung base. No pleural effusion or pneumothorax is identified. Upper Abdomen: Multiple surgical clips are seen within the gallbladder fossa. Musculoskeletal: A chronic fracture deformity is seen involving the body of the sternum. No acute osseous abnormalities are identified. Review of the MIP images confirms the above findings. IMPRESSION: 1. Marked severity right-sided pulmonary embolism with extension to involve multiple upper lobe branches. 2. Moderate to large pericardial effusion. 3. Thick curvilinear areas of low attenuation within the lumen of the left ventricle, which may represent areas of thrombus. Further evaluation with an echocardiogram and cardiology consultation is recommended. 4. Mild left lower lobe and right basilar  atelectasis. 5. Evidence of prior cholecystectomy. 6. Aortic atherosclerosis. Aortic Atherosclerosis (ICD10-I70.0). Electronically Signed   By: Aram Candela M.D.   On: 04/07/2023 02:07   CT ANGIO HEAD NECK W WO CM  Result Date: 04/07/2023 CLINICAL DATA:  Follow-up examination for stroke. EXAM: CT ANGIOGRAPHY HEAD AND  NECK WITH AND WITHOUT CONTRAST TECHNIQUE: Multidetector CT imaging of the head and neck was performed using the standard protocol during bolus administration of intravenous contrast. Multiplanar CT image reconstructions and MIPs were obtained to evaluate the vascular anatomy. Carotid stenosis measurements (when applicable) are obtained utilizing NASCET criteria, using the distal internal carotid diameter as the denominator. RADIATION DOSE REDUCTION: This exam was performed according to the departmental dose-optimization program which includes automated exposure control, adjustment of the mA and/or kV according to patient size and/or use of iterative reconstruction technique. CONTRAST:  75mL OMNIPAQUE IOHEXOL 350 MG/ML SOLN COMPARISON:  MRI from earlier the same day. FINDINGS: CT HEAD FINDINGS Brain: Cerebral volume within normal limits. Moderate chronic microvascular ischemic disease. Remote right parietal infarct noted. Few small remote lacunar infarcts about the thalami. Chronic right cerebellar infarct. Previously identified acute left MCA distribution infarct not visualized by CT. No acute intracranial hemorrhage. No other acute large vessel territory infarct. No mass lesion or midline shift. No hydrocephalus or extra-axial fluid collection. Vascular: No abnormal hyperdense vessel. Skull: Scalp soft tissues within normal limits.  Calvarium intact. Sinuses/Orbits: Globes orbital soft tissues within normal limits. Other: None. Review of the MIP images confirms the above findings CTA NECK FINDINGS Aortic arch: Visualized aortic arch within normal limits for caliber with standard 3 vessel morphology. No stenosis about the origin the great vessels. Right carotid system: Right common and internal carotid arteries are tortuous but patent without stenosis or dissection. Left carotid system: Left common and internal carotid arteries are tortuous but patent without stenosis or dissection. Vertebral arteries: Both  vertebral arteries arise from the subclavian arteries. No proximal subclavian artery stenosis. Vertebral arteries patent without stenosis or dissection. Skeleton: No discrete or worrisome osseous lesions. Other neck: No other acute finding. 1.5 cm nodule present at the thyroid isthmus. Upper chest: Scattered filling defects are seen within the right main and partially visualized segmental pulmonary arteries, consistent with acute pulmonary embolism (series 8, image 157). Main pulmonary artery within normal limits for caliber at 2.7 cm. Review of the MIP images confirms the above findings CTA HEAD FINDINGS Anterior circulation: Both internal carotid arteries are patent to the termini without stenosis. A1 segments patent bilaterally. Right A1 hypoplastic. Normal anterior communicating artery complex. Anterior cerebral arteries patent without stenosis. No M1 stenosis or occlusion. No proximal MCA branch occlusion or high-grade stenosis. Right MCA branches well perfused. On the left, there is and distal left M4 occlusion (the series 10, image 78)., corresponding with the previously identified small left MCA distribution infarct. Posterior circulation: Left vertebral artery patent without stenosis. Right vertebral artery is widely patent to the takeoff of the right PICA, but occluded distally. Right PICA patent. Left PICA origin not well seen. Basilar markedly diminutive and irregular but patent to its distal aspect. Superior cerebral arteries patent bilaterally. Fetal type origin of the PCAs bilaterally both of which are widely patent to their distal aspects. Venous sinuses: Patent allowing for timing the contrast bolus. Anatomic variants: As above.  No aneurysm. Review of the MIP images confirms the above findings IMPRESSION: CT HEAD: 1. No acute intracranial abnormality. 2. Previously identified acute left MCA distribution infarct not well visualized by CT. No  acute intracranial hemorrhage. 3. Underlying moderate  chronic microvascular ischemic disease with multiple remote infarcts as above. CTA HEAD AND NECK: 1. Acute distal left M4 occlusion, corresponding with the previously identified small left MCA distribution infarct. 2. Fetal type origin of the PCAs with overall diminutive vertebrobasilar system. 3. Diffuse tortuosity of the major arterial vasculature of the head and neck, suggesting chronic underlying hypertension. 4. Acute pulmonary embolism within the right main and partially visualized segmental pulmonary arteries. Further evaluation with dedicated CTA of the chest recommended for complete evaluation. 5. 1.5 cm nodule at the thyroid isthmus. Further evaluation with dedicated thyroid ultrasound recommended. This could be performed in mild emergent outpatient basis. (Ref: J Am Coll Radiol. 2015 Feb;12(2): 143-50). Critical Value/emergent results were called by telephone at the time of interpretation on 04/07/2023 at 1:16 am to provider Dr. Dolores Frame, who verbally acknowledged these results. Electronically Signed   By: Rise Mu M.D.   On: 04/07/2023 01:25   MR BRAIN WO CONTRAST  Result Date: 04/06/2023 CLINICAL DATA:  Provided history: Mental status change, unknown cause. Speech difficulty. EXAM: MRI HEAD WITHOUT CONTRAST TECHNIQUE: Multiplanar, multiecho pulse sequences of the brain and surrounding structures were obtained without intravenous contrast. COMPARISON:  Head CT 06/18/2021. FINDINGS: Brain: No age advanced or lobar predominant parenchymal atrophy. Small acute cortical infarct within the left insula and adjacent left parietal lobe. Petechial hemorrhage also present at this site. 11 mm chronic white matter infarct within the right parietal lobe. Adjacent small chronic cortical infarct within the right parietal lobe (with a small amount of chronic hemosiderin deposition at this site). Moderate multifocal T2 FLAIR hyperintense signal abnormality elsewhere within the cerebral white matter,  nonspecific but compatible chronic small vessel ischemic disease. Chronic lacunar infarcts within the bilateral deep gray nuclei. Mild chronic small vessel ischemic changes within the pons. Small chronic infarcts within the cerebellar vermis and bilateral cerebellar hemispheres. Chronic microhemorrhages within the supratentorial and infratentorial brain (with a deep gray nuclei, brainstem and cerebellar predominance). No evidence of an intracranial mass. No extra-axial fluid collection. No midline shift. Vascular: Maintained flow voids within the proximal large arterial vessels. Skull and upper cervical spine: No focal worrisome marrow lesion. Sinuses/Orbits: No mass or acute finding within the imaged orbits. No significant paranasal sinus disease. Impression #1 called by telephone at the time of interpretation on 04/06/2023 at 6:30 pm to provider PHILLIP STAFFORD , who verbally acknowledged these results. IMPRESSION: 1. Small acute cortical infarct within the left insula and adjacent left parietal lobe. Petechial hemorrhage also present at this site. 2. Background chronic small vessel ischemic disease and chronic infarcts as outlined. 3. Chronic microhemorrhages within the supratentorial and infratentorial brain, in a distribution suggesting sequelae of chronic hypertensive microangiopathy. Electronically Signed   By: Jackey Loge D.O.   On: 04/06/2023 18:31      IMPRESSION AND PLAN:  Assessment and Plan: * Acute CVA (cerebrovascular accident) [facility]) - The patient will be admitted to an observation medically monitored bed.   - We will follow neuro checks q.4 hours for 24 hours.   - The patient will be placed on aspirin.   - Will obtain a 2D echo with bubble study .   - A neurology consultation  as well as physical/occupation/speech therapy consults will be obtained in a.m.[name]   - The patient will be placed on statin therapy and fasting lipids will be checked.   Acute pulmonary embolism (HCC) - The  patient be placed on IV heparin if cleared by neurology  to replace Eliquis. - Will follow bilateral lower extremity venous Doppler results. - We will obtain 2D echo to assess for RV strain.  Hypertensive urgency - We will continue antihypertensive therapy with permissive parameters given acute CVA.  Hypokalemia - Potassium will be replaced.  Chronic combined systolic and diastolic CHF (congestive heart failure) (HCC) - We will continue Entresto, Lasix, Toprol-XL and Aldactone.  History of recurrent deep vein thrombosis (DVT) - The patient was on p.o. Eliquis. - Given current right-sided acute PE, will place him on heparin if cleared by neurology.  Dyslipidemia - We will continue statin therapy.  DM (diabetes mellitus), type 2 (HCC) - The patient will be placed on supplemental coverage with NovoLog. - We will continue Glucotrol XL.   DVT prophylaxis: Lovenox.  Advanced Care Planning:  Code Status: full code.  Family Communication:  The plan of care was discussed in details with the patient (and family). I answered all questions. The patient agreed to proceed with the above mentioned plan. Further management will depend upon hospital course. Disposition Plan: Back to previous home environment Consults called: none.  All the records are reviewed and case discussed with ED provider.  Status is: Inpatient  At the time of the admission, it appears that the appropriate admission status for this patient is inpatient.  This is judged to be reasonable and necessary in order to provide the required intensity of service to ensure the patient's safety given the presenting symptoms, physical exam findings and initial radiographic and laboratory data in the context of comorbid conditions.  The patient requires inpatient status due to high intensity of service, high risk of further deterioration and high frequency of surveillance required.  I certify that at the time of admission, it is my  clinical judgment that the patient will require inpatient hospital care extending more than 2 midnights.                            Dispo: The patient is from: Home              Anticipated d/c is to: Home              Patient currently is not medically stable to d/c.              Difficult to place patient: No  Hannah Beat M.D on 04/07/2023 at 5:29 AM  [facility]   From 7 PM-7 AM, contact night-coverage [email or web address]  CC: Primary care physician; Lorre Munroe, NP

## 2023-04-07 NOTE — Progress Notes (Signed)
Progress Note   Patient: Erin Hayes ZOX:[id number] DOB: [birth year] DOA: 04/06/2023     0 DOS: the patient was seen and examined on 04/07/2023   Brief hospital course: Erin Hayes is a 57 y.o. female with medical history significant for chronic combined systolic and diastolic CHF, GERD, hypertension, dyslipidemia and nonischemic cardiomyopathy, who presented to the emergency room with acute onset of altered mental status with confusion.  Patient had a history of PE/DVT a year ago, patient also had a left lower extremity fracture 4 months ago.  MRI of the brain showed small acute cortical infarct within the left insular, CT angiogram of the neck and head showed acute distal left M4 occlusion. CT angiogram of chest showed severe right sided pulmonary embolism with extension to multiple upper lobes branches.  Duplex ultrasound showed extensive left lower extremity DVT. Patient has been seen by teleneurology, started on heparin drip.   Principal Problem:   Acute CVA (cerebrovascular accident) (HCC) Active Problems:   Acute pulmonary embolism (HCC)   Hypertensive urgency   Chronic combined systolic and diastolic CHF (congestive heart failure) (HCC)   Hypokalemia   DM (diabetes mellitus), type 2 (HCC)   AKI (acute kidney injury) (HCC)   Dyslipidemia   Acute ischemic stroke (HCC)   History of recurrent deep vein thrombosis (DVT)   Assessment and Plan: * Acute CVA (cerebrovascular accident) (HCC) involving left MCA distribution. Left ICA M4 branch occlusion. Given concurrent pulmonary emboli, suspicion raised for thromboembolic stroke.  Echocardiogram has been ordered, bubble study will be performed to rule out PFO. Patient has been seen by teleneurology, I spoke with Dr. Otelia Limes, he will follow the patient. Heparin drip was started, will continue.   Acute pulmonary embolism (HCC) Left lower extremity occlusive DVT. Patient had a history of DVT a year ago, this is the second episode.   Patient also has left lower extremity ankle fracture, which she will predispose patient to have DVT. However, since this is second episode, with the severity of DVT/PE, the patient will need lifetime anticoagulation. Continue heparin drip.  Hypertensive urgency As needed blood pressure medicine ordered.  Hypokalemia Potassium has normalized.  Chronic combined systolic and diastolic CHF (congestive heart failure) (HCC) - We will continue Entresto, Lasix, Toprol-XL and Aldactone.   Dyslipidemia - We will continue statin therapy.  DM (diabetes mellitus), type 2 (HCC) - The patient will be placed on supplemental coverage with NovoLog. Discontinue oral diabetic medicine.       Subjective: Patient doing well today, no longer has any chest pain or shortness of breath.  Physical Exam: Vitals:   04/07/23 0743 04/07/23 0745 04/07/23 0936 04/07/23 0938  BP:  (!) 151/100 (!) 150/102 (!) 150/102  Pulse:  88 91   Resp:  (!) 21    Temp: 98.2 F (36.8 C) 98 F (36.7 C)    TempSrc: Oral Oral    SpO2:  99%    Weight:       General exam: Appears calm and comfortable  Respiratory system: Clear to auscultation. Respiratory effort normal. Cardiovascular system: S1 & S2 heard, RRR. No JVD, murmurs, rubs, gallops or clicks. No pedal edema. Gastrointestinal system: Abdomen is nondistended, soft and nontender. No organomegaly or masses felt. Normal bowel sounds heard. Central nervous system: Alert and oriented. No focal neurological deficits. Extremities: Symmetric 5 x 5 power. Skin: No rashes, lesions or ulcers Psychiatry: Judgement and insight appear normal. Mood & affect appropriate.    Data Reviewed:  Reviewed CT scan results,  duplex ultrasound results and lab results.  Family Communication: None  Disposition: Status is: Inpatient Remains inpatient appropriate because: Severity of disease, IV treatment.     Time spent: No charge minutes  Author: Marrion Coy,  MD 04/07/2023 10:23 AM  For on call review [email or web address].

## 2023-04-07 NOTE — Assessment & Plan Note (Signed)
-   The patient be placed on IV heparin if cleared by neurology to replace Eliquis. - Will follow bilateral lower extremity venous Doppler results. - We will obtain 2D echo to assess for RV strain.

## 2023-04-07 NOTE — Assessment & Plan Note (Signed)
-   We will continue Entresto, Lasix, Toprol-XL and Aldactone.

## 2023-04-07 NOTE — Consult Note (Addendum)
NEURO HOSPITALIST CONSULT NOTE   Requesting physician: Dr. Chipper Herb  Reason for Consult: Acute onset of aphasia  History obtained from:  Patient and Chart     HPI:                                                                                                                                          Erin Hayes is a 57 y.o. 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 Family described this as "talking weird". There was no associated facial droop, dysarthria, loss of balance, vision loss, incoordination, limb numbness, limb weakness or neck pain. However, she was complaining of abdominal pain, dizziness and malaise.   MRI reveals a small acute cortical infarct within the left insula and adjacent left parietal lobe.   Past Medical History:  Diagnosis Date   Carpal tunnel syndrome of right wrist    Chronic combined systolic (congestive) and diastolic (congestive) heart failure (HCC)    a. 03/2017 Echo: EF 20-25%, diff HK, Gr2 DD; b. 09/2017 Echo: EF 20-25%, diff HK, Gr2 DD.   Diabetes (HCC)    GERD (gastroesophageal reflux disease)    HLD (hyperlipidemia)    Hypertension    NICM (nonischemic cardiomyopathy) (HCC)    a. 03/2017 Echo: EF 20-25%, diff HK, Gr2 DD, mild to mod MR, nl RV fxn;  b. 03/2017 Cath: mild nonobs dzs, EF 25%; c. 09/2017 Echo: EF 20-25%, diff HK. Gr2 DD. Mild MR. Mildly reduced RV fxn.   Pleural effusion, left    a. 03/2017 s/p thoracentesis-->300 ml withdrawn--transudative.   Pneumonia 02/19/2017    Past Surgical History:  Procedure Laterality Date   COLONOSCOPY WITH PROPOFOL N/A 01/21/2017   Procedure: COLONOSCOPY WITH PROPOFOL;   Surgeon: Wyline Mood, MD;  Location: [facility] ENDOSCOPY;  Service: Gastroenterology;  Laterality: N/A;   ESOPHAGOGASTRODUODENOSCOPY (EGD) WITH PROPOFOL N/A 01/21/2017   Procedure: ESOPHAGOGASTRODUODENOSCOPY (EGD) WITH PROPOFOL;  Surgeon: Wyline Mood, MD;  Location: [facility] ENDOSCOPY;  Service: Gastroenterology;  Laterality: N/A;   FLEXIBLE SIGMOIDOSCOPY N/A 11/04/2016   Procedure: FLEXIBLE SIGMOIDOSCOPY;  Surgeon: Charlott Rakes, MD;  Location: [facility] ENDOSCOPY;  Service: Endoscopy;  Laterality: N/A;   HIP PINNING,CANNULATED Left 06/19/2021   Procedure: CANNULATED HIP PINNING;  Surgeon: Ross Marcus, MD;  Location: [facility] ORS;  Service: Orthopedics;  Laterality: Left;   LEFT HEART CATH AND CORONARY ANGIOGRAPHY N/A 04/05/2017   Procedure: LEFT HEART CATH AND CORONARY ANGIOGRAPHY;  Surgeon: Iran Ouch, MD;  Location: [facility] INVASIVE CV LAB;  Service: Cardiovascular;  Laterality: N/A;   LITHOTRIPSY      Family History  Problem Relation Age of Onset   Lung cancer Mother    Hypertension Father    CAD Father        a. MI age 100   Heart disease Father    COPD Neg Hx    Diabetes Mellitus II Neg Hx              Social History:  reports that she has never smoked. She has never used smokeless tobacco. She reports that she does not drink alcohol and does not use drugs.  No Known Allergies  MEDICATIONS:                                                                                                                     No current facility-administered medications on file prior to encounter.   Current Outpatient Medications on File Prior to Encounter  Medication Sig Dispense Refill   albuterol (VENTOLIN HFA) 108 (90 Base) MCG/ACT inhaler Inhale 1-2 puffs into the lungs every 6 (six) hours as needed for wheezing or shortness of breath.     apixaban (ELIQUIS) 5 MG TABS tablet Take 5 mg by mouth 2 (two) times daily.     furosemide (LASIX) 40 MG tablet Take 1 tablet (40 mg total) by mouth daily. 90  tablet 3   glipiZIDE (GLUCOTROL XL) 5 MG 24 hr tablet Take 1 tablet (5 mg total) by mouth daily with breakfast. 90 tablet 0   hydrALAZINE (APRESOLINE) 25 MG tablet Take 1 tablet (25 mg total) by mouth 3 (three) times daily. 90 tablet 6   loperamide HCl (IMODIUM) 1 MG/7.5ML solution Take 15 mL (2 mg total) by mouth four (4) times a day as needed for diarrhea.     metoprolol succinate (TOPROL-XL) 25 MG 24 hr tablet Take 1 tablet (25 mg total) by mouth daily. 90 tablet 1   rosuvastatin (CRESTOR) 20 MG tablet Take 1 tablet (20 mg total) by mouth daily. 90 tablet 1   sacubitril-valsartan (ENTRESTO) 97-103 MG Take 1 tablet by mouth 2 (two) times daily. 180 tablet 1   spironolactone (ALDACTONE) 25 MG tablet Take 1 tablet (25 mg total) by mouth daily. 90 tablet 1   Multiple Vitamin (MULTIVITAMIN WITH MINERALS) TABS tablet Take 1 tablet by mouth daily.      Scheduled:  [START ON 04/08/2023]  stroke: early stages of recovery book   Does not apply Once   furosemide  40 mg Oral Daily   hydrALAZINE  25 mg Oral TID   insulin aspart  0-9 Units Subcutaneous TID WC   metoprolol succinate  25 mg Oral Daily   multivitamin with minerals  1 tablet Oral Daily   rosuvastatin  20 mg Oral Daily   sacubitril-valsartan  1 tablet Oral BID   spironolactone  25 mg Oral Daily   Continuous:  sodium chloride Stopped (04/07/23 0620)   heparin 750 Units/hr (04/07/23 0554)  ROS:                                                                                                                                       As per HPI.    Blood pressure (!) 150/102, pulse 91, temperature 98 F (36.7 C), temperature source Oral, resp. rate (!) 21, weight 54.4 kg, SpO2 99%.   General Examination:                                                                                                       Physical Exam HEENT- [place]/AT   Lungs- Respirations unlabored Extremities- Warm and well-perfused  Neurological Examination Mental  Status: Awake and alert. Fully oriented x 5. Thought content appropriate.  Speech is fluent but with some phonemic paraphasias and difficulty identifying uncommon objects when testing naming. Repetition is impaired. Comprehension intact for all questions and commands. No dysarthria.  Cranial Nerves: II: Temporal visual fields intact with no extinction to DSS. PERRL. III,IV, VI: No ptosis. EOMI. No nystagmus. V: Temp sensation equal bilaterally, but with extinction on the right to DSS with fine touch.  VII: Smile symmetric VIII: Hearing intact to voice IX,X: No hypophonia or hoarseness XI: Symmetric XII: Midline tongue extension Motor: RUE: 5/5 without drift LUE: 5/5 without drift RLE: 5/5 LLE: 5/5 Sensory: Temp and FT intact x 4. Positive for extinction on the right with DSS. Deep Tendon Reflexes: 2+ and symmetric bilateral biceps, brachioradialis and patellae Cerebellar: No ataxia with FNF bilaterally Gait: Deferred    Lab Results: Basic Metabolic Panel: Recent Labs  Lab 04/06/23 1445 04/07/23 0447  NA 137 137  K 3.2* 4.1  CL 105 103  CO2 22 24  GLUCOSE 191* 196*  BUN 20 19  CREATININE 1.10* 1.07*  CALCIUM 7.9* 8.0*    CBC: Recent Labs  Lab 04/06/23 1445 04/07/23 0447  WBC 9.6 10.3  HGB 13.0 12.3  HCT 39.6 38.0  MCV 85.0 86.0  PLT 231 331    Cardiac Enzymes: No results for input(s): "CKTOTAL", "CKMB", "CKMBINDEX", "TROPONINI" in the last 168 hours.  Lipid Panel: Recent Labs  Lab 04/07/23 0447  CHOL 217*  TRIG 106  HDL 65  CHOLHDL 3.3  VLDL 21  LDLCALC 161*    Imaging: US Venous Img Lower Bilateral (DVT)  Result Date: 04/07/2023 CLINICAL DATA:  Bilateral lower extremity swelling. EXAM: BILATERAL LOWER EXTREMITY VENOUS DOPPLER ULTRASOUND TECHNIQUE: Gray-scale sonography with graded compression, as well as color Doppler and duplex ultrasound were  performed to evaluate the lower extremity deep venous systems from the level of the common femoral vein and  including the common femoral, femoral, profunda femoral, popliteal and calf veins including the posterior tibial, peroneal and gastrocnemius veins when visible. The superficial great saphenous vein was also interrogated. Spectral Doppler was utilized to evaluate flow at rest and with distal augmentation maneuvers in the common femoral, femoral and popliteal veins. COMPARISON:  None Available. FINDINGS: RIGHT LOWER EXTREMITY Common Femoral Vein: No evidence of thrombus. Normal compressibility, respiratory phasicity and response to augmentation. Saphenofemoral Junction: No evidence of thrombus. Normal compressibility and flow on color Doppler imaging. Profunda Femoral Vein: No evidence of thrombus. Normal compressibility and flow on color Doppler imaging. Femoral Vein: No evidence of thrombus. Normal compressibility, respiratory phasicity and response to augmentation. Popliteal Vein: No evidence of thrombus. Normal compressibility, respiratory phasicity and response to augmentation. Calf Veins: No evidence of thrombus. Normal compressibility and flow on color Doppler imaging. Superficial Great Saphenous Vein: No evidence of thrombus. Normal compressibility. Venous Reflux:  None. Other Findings:  None. LEFT LOWER EXTREMITY Common Femoral Vein: No evidence of thrombus. Normal compressibility, respiratory phasicity and response to augmentation. Saphenofemoral Junction: No evidence of thrombus. Normal compressibility and flow on color Doppler imaging. Profunda Femoral Vein: No evidence of thrombus. Normal compressibility and flow on color Doppler imaging. Femoral Vein: Evidence of occlusive thrombus with abnormal compressibility, respiratory phasicity and response to augmentation. Popliteal Vein: No evidence of thrombus. Normal compressibility, respiratory phasicity and response to augmentation. Calf Veins: Evidence of occlusive thrombus within the LEFT posterior tibial vein and LEFT peroneal vein with abnormal  compressibility and flow on color Doppler imaging. Superficial Great Saphenous Vein: No evidence of thrombus. Normal compressibility. Venous Reflux:  None. Other Findings:  None. IMPRESSION: 1. Evidence of occlusive DVT within the LEFT femoral vein, LEFT posterior tibial vein and LEFT peroneal vein. 2. No evidence of DVT within the RIGHT lower extremity. Electronically Signed   By: Aram Candela M.D.   On: 04/07/2023 04:02   CT Angio Chest PE W/Cm &/Or Wo Cm  Result Date: 04/07/2023 CLINICAL DATA:  Altered mental status. EXAM: CT ANGIOGRAPHY CHEST WITH CONTRAST TECHNIQUE: Multidetector CT imaging of the chest was performed using the standard protocol during bolus administration of intravenous contrast. Multiplanar CT image reconstructions and MIPs were obtained to evaluate the vascular anatomy. RADIATION DOSE REDUCTION: This exam was performed according to the departmental dose-optimization program which includes automated exposure control, adjustment of the mA and/or kV according to patient size and/or use of iterative reconstruction technique. CONTRAST:  75mL OMNIPAQUE IOHEXOL 350 MG/ML SOLN COMPARISON:  April 04, 2017 FINDINGS: Cardiovascular: There is mild calcification of the proximal portion of the descending thoracic aorta, without evidence of aortic aneurysm. Satisfactory opacification of the pulmonary arteries to the segmental level. Marked severity intraluminal low attenuation is seen within the distal aspect of the right pulmonary artery with extension to involve multiple upper lobe branches. There is left ventricular hypertrophy without evidence of right heart strain. Thick curvilinear areas of low attenuation are seen within the lumen of the left ventricle. A moderate to large pericardial effusion is seen (approximately 9.9 mm in AP thickness). Mediastinum/Nodes: No enlarged mediastinal, hilar, or axillary lymph nodes. Thyroid gland, trachea, and esophagus demonstrate no significant findings.  Lungs/Pleura: A mild atelectatic changes are seen within the anterior aspect of the left lower lobe and posteromedial aspect of the right lung base. No pleural effusion or pneumothorax is identified. Upper Abdomen: Multiple surgical clips are  seen within the gallbladder fossa. Musculoskeletal: A chronic fracture deformity is seen involving the body of the sternum. No acute osseous abnormalities are identified. Review of the MIP images confirms the above findings. IMPRESSION: 1. Marked severity right-sided pulmonary embolism with extension to involve multiple upper lobe branches. 2. Moderate to large pericardial effusion. 3. Thick curvilinear areas of low attenuation within the lumen of the left ventricle, which may represent areas of thrombus. Further evaluation with an echocardiogram and cardiology consultation is recommended. 4. Mild left lower lobe and right basilar atelectasis. 5. Evidence of prior cholecystectomy. 6. Aortic atherosclerosis. Aortic Atherosclerosis (ICD10-I70.0). Electronically Signed   By: Aram Candela M.D.   On: 04/07/2023 02:07   CT ANGIO HEAD NECK W WO CM  Result Date: 04/07/2023 CLINICAL DATA:  Follow-up examination for stroke. EXAM: CT ANGIOGRAPHY HEAD AND NECK WITH AND WITHOUT CONTRAST TECHNIQUE: Multidetector CT imaging of the head and neck was performed using the standard protocol during bolus administration of intravenous contrast. Multiplanar CT image reconstructions and MIPs were obtained to evaluate the vascular anatomy. Carotid stenosis measurements (when applicable) are obtained utilizing NASCET criteria, using the distal internal carotid diameter as the denominator. RADIATION DOSE REDUCTION: This exam was performed according to the departmental dose-optimization program which includes automated exposure control, adjustment of the mA and/or kV according to patient size and/or use of iterative reconstruction technique. CONTRAST:  75mL OMNIPAQUE IOHEXOL 350 MG/ML SOLN  COMPARISON:  MRI from earlier the same day. FINDINGS: CT HEAD FINDINGS Brain: Cerebral volume within normal limits. Moderate chronic microvascular ischemic disease. Remote right parietal infarct noted. Few small remote lacunar infarcts about the thalami. Chronic right cerebellar infarct. Previously identified acute left MCA distribution infarct not visualized by CT. No acute intracranial hemorrhage. No other acute large vessel territory infarct. No mass lesion or midline shift. No hydrocephalus or extra-axial fluid collection. Vascular: No abnormal hyperdense vessel. Skull: Scalp soft tissues within normal limits.  Calvarium intact. Sinuses/Orbits: Globes orbital soft tissues within normal limits. Other: None. Review of the MIP images confirms the above findings CTA NECK FINDINGS Aortic arch: Visualized aortic arch within normal limits for caliber with standard 3 vessel morphology. No stenosis about the origin the great vessels. Right carotid system: Right common and internal carotid arteries are tortuous but patent without stenosis or dissection. Left carotid system: Left common and internal carotid arteries are tortuous but patent without stenosis or dissection. Vertebral arteries: Both vertebral arteries arise from the subclavian arteries. No proximal subclavian artery stenosis. Vertebral arteries patent without stenosis or dissection. Skeleton: No discrete or worrisome osseous lesions. Other neck: No other acute finding. 1.5 cm nodule present at the thyroid isthmus. Upper chest: Scattered filling defects are seen within the right main and partially visualized segmental pulmonary arteries, consistent with acute pulmonary embolism (series 8, image 157). Main pulmonary artery within normal limits for caliber at 2.7 cm. Review of the MIP images confirms the above findings CTA HEAD FINDINGS Anterior circulation: Both internal carotid arteries are patent to the termini without stenosis. A1 segments patent bilaterally.  Right A1 hypoplastic. Normal anterior communicating artery complex. Anterior cerebral arteries patent without stenosis. No M1 stenosis or occlusion. No proximal MCA branch occlusion or high-grade stenosis. Right MCA branches well perfused. On the left, there is and distal left M4 occlusion (the series 10, image 78)., corresponding with the previously identified small left MCA distribution infarct. Posterior circulation: Left vertebral artery patent without stenosis. Right vertebral artery is widely patent to the takeoff of the right PICA,  but occluded distally. Right PICA patent. Left PICA origin not well seen. Basilar markedly diminutive and irregular but patent to its distal aspect. Superior cerebral arteries patent bilaterally. Fetal type origin of the PCAs bilaterally both of which are widely patent to their distal aspects. Venous sinuses: Patent allowing for timing the contrast bolus. Anatomic variants: As above.  No aneurysm. Review of the MIP images confirms the above findings IMPRESSION: CT HEAD: 1. No acute intracranial abnormality. 2. Previously identified acute left MCA distribution infarct not well visualized by CT. No acute intracranial hemorrhage. 3. Underlying moderate chronic microvascular ischemic disease with multiple remote infarcts as above. CTA HEAD AND NECK: 1. Acute distal left M4 occlusion, corresponding with the previously identified small left MCA distribution infarct. 2. Fetal type origin of the PCAs with overall diminutive vertebrobasilar system. 3. Diffuse tortuosity of the major arterial vasculature of the head and neck, suggesting chronic underlying hypertension. 4. Acute pulmonary embolism within the right main and partially visualized segmental pulmonary arteries. Further evaluation with dedicated CTA of the chest recommended for complete evaluation. 5. 1.5 cm nodule at the thyroid isthmus. Further evaluation with dedicated thyroid ultrasound recommended. This could be performed in  mild emergent outpatient basis. (Ref: J Am Coll Radiol. 2015 Feb;12(2): 143-50). Critical Value/emergent results were called by telephone at the time of interpretation on 04/07/2023 at 1:16 am to provider Dr. Dolores Frame, who verbally acknowledged these results. Electronically Signed   By: Rise Mu M.D.   On: 04/07/2023 01:25   MR BRAIN WO CONTRAST  Result Date: 04/06/2023 CLINICAL DATA:  Provided history: Mental status change, unknown cause. Speech difficulty. EXAM: MRI HEAD WITHOUT CONTRAST TECHNIQUE: Multiplanar, multiecho pulse sequences of the brain and surrounding structures were obtained without intravenous contrast. COMPARISON:  Head CT 06/18/2021. FINDINGS: Brain: No age advanced or lobar predominant parenchymal atrophy. Small acute cortical infarct within the left insula and adjacent left parietal lobe. Petechial hemorrhage also present at this site. 11 mm chronic white matter infarct within the right parietal lobe. Adjacent small chronic cortical infarct within the right parietal lobe (with a small amount of chronic hemosiderin deposition at this site). Moderate multifocal T2 FLAIR hyperintense signal abnormality elsewhere within the cerebral white matter, nonspecific but compatible chronic small vessel ischemic disease. Chronic lacunar infarcts within the bilateral deep gray nuclei. Mild chronic small vessel ischemic changes within the pons. Small chronic infarcts within the cerebellar vermis and bilateral cerebellar hemispheres. Chronic microhemorrhages within the supratentorial and infratentorial brain (with a deep gray nuclei, brainstem and cerebellar predominance). No evidence of an intracranial mass. No extra-axial fluid collection. No midline shift. Vascular: Maintained flow voids within the proximal large arterial vessels. Skull and upper cervical spine: No focal worrisome marrow lesion. Sinuses/Orbits: No mass or acute finding within the imaged orbits. No significant paranasal sinus  disease. Impression #1 called by telephone at the time of interpretation on 04/06/2023 at 6:30 pm to provider PHILLIP STAFFORD , who verbally acknowledged these results. IMPRESSION: 1. Small acute cortical infarct within the left insula and adjacent left parietal lobe. Petechial hemorrhage also present at this site. 2. Background chronic small vessel ischemic disease and chronic infarcts as outlined. 3. Chronic microhemorrhages within the supratentorial and infratentorial brain, in a distribution suggesting sequelae of chronic hypertensive microangiopathy. Electronically Signed   By: Jackey Loge D.O.   On: 04/06/2023 18:31     Assessment: 57 y.o. 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 Family described this as "talking weird". There was no associated facial droop, dysarthria, loss of balance, vision loss, incoordination, limb numbness, limb weakness or neck pain. However, she was complaining of abdominal pain, dizziness and malaise. MRI reveals a small acute cortical infarct within the left insula and adjacent left parietal lobe. - Exam reveals mild expressive aphasia with sensory extinction on the right. No motor weakness or ataxia noted.  - Stroke protocol imaging: - MRI brain: Small acute cortical infarct within the left insula and adjacent left parietal lobe. Petechial hemorrhage also present at this site. Background chronic small vessel ischemic disease and chronic infarcts. Chronic microhemorrhages within the supratentorial and infratentorial brain, in a distribution suggesting sequelae of chronic hypertensive microangiopathy. - CTA of head and neck: Acute distal left M4  occlusion, corresponding with the previously identified small left MCA distribution infarct. Fetal type origin of the PCAs with overall diminutive vertebrobasilar system. Diffuse tortuosity of the major arterial vasculature of the head and neck, suggesting chronic underlying hypertension.  - CTA also reveals an acute pulmonary embolism within the right main and partially visualized segmental pulmonary arteries. 1.5 cm nodule at the thyroid isthmus was also noted, with dedicated thyroid ultrasound recommended by Radiology.  - TTE completed with report pending - Stroke risk factors: CHF, DM, HLD, HTN and chronic infarcts visible on MRI - Overall impression: Acute left MCA territory cortically-based infarction with petechial hemorrhage.   Recommendations: 1. HgbA1c, fasting lipid panel 2. TTE is pending 3. PT consult, OT consult, Speech consult 4. Stop ASA and continue IV heparin. No indication for an antiplatelet agent for stroke prevention if already on anticoagulation. Can consider transitioning to an anticoagulant other than Eliquis as her PE and stroke occurred despite Eliquis, although change may not be needed if she endorses partial or non-compliance.  5. Continue rosuvastatin 6. Will need to be on lifelong anticoagulation  7. Risk factor modification 8. Telemetry monitoring 9. Frequent neuro checks 10. NPO until passes stroke swallow screen 11. Glycemic control 12. Benefits of anticoagulation significantly outweigh the risks. The petechial hemorrhage has a relatively low risk of rebleeding. Risk of propagation of PE off anticoagulation significantly outweighs risk of cerebral petechial hemorrhage extension on anticoagulation.     Electronically signed: Dr. Caryl Pina 04/07/2023, 10:55 AM

## 2023-04-07 NOTE — Progress Notes (Addendum)
ANTICOAGULATION CONSULT NOTE  Pharmacy Consult for heparin infusion Indication: pulmonary embolus  No Known Allergies  Patient Measurements: Weight: 54.4 kg (120 lb) (From O/V note 03/19/23) Heparin Dosing Weight: 54.4 kg  Vital Signs: Temp: 98.1 F (36.7 C) (11/30 2316) Temp Source: Oral (11/30 2316) BP: 166/102 (12/01 0100) Pulse Rate: 83 (11/30 2316)  Labs: Recent Labs    04/06/23 1445  HGB 13.0  HCT 39.6  PLT 231  CREATININE 1.10*    Estimated Creatinine Clearance: 41.5 mL/min (A) (by C-G formula based on SCr of 1.1 mg/dL (H)).   Medical History: Past Medical History:  Diagnosis Date   Carpal tunnel syndrome of right wrist    Chronic combined systolic (congestive) and diastolic (congestive) heart failure (HCC)    a. 03/2017 Echo: EF 20-25%, diff HK, Gr2 DD; b. 09/2017 Echo: EF 20-25%, diff HK, Gr2 DD.   Diabetes (HCC)    GERD (gastroesophageal reflux disease)    HLD (hyperlipidemia)    Hypertension    NICM (nonischemic cardiomyopathy) (HCC)    a. 03/2017 Echo: EF 20-25%, diff HK, Gr2 DD, mild to mod MR, nl RV fxn;  b. 03/2017 Cath: mild nonobs dzs, EF 25%; c. 09/2017 Echo: EF 20-25%, diff HK. Gr2 DD. Mild MR. Mildly reduced RV fxn.   Pleural effusion, left    a. 03/2017 s/p thoracentesis-->300 ml withdrawn--transudative.   Pneumonia 02/19/2017    Medications:  PTA Meds: Eliquis 5 mg BID, last dose unknown  Assessment: Pt is a 57 yo female with h/o DVT on Eliquis, presenting to ED due to confusion that started abruptly at 12:30 PM today, lasting about an hour, found with "marked severity right-sided pulmonary embolism with extension to involve multiple upper lobe branches."  Per neurology note:  heparin low intensity no bolus protocol  Goal of Therapy:  Heparin level 0.3-0.5 units/ml aPTT 66-85 seconds Monitor platelets by anticoagulation protocol: Yes    Plan:  No boluses, per neurology Start heparin infusion at 750 units/hr Will follow aPTT until  correlation w/ HL confirmed Will check aPTT in 6 hr after start of infusion HL & CBC daily while on heparin  Otelia Sergeant, PharmD, [facility] 04/07/2023 2:18 AM

## 2023-04-07 NOTE — Consult Note (Signed)
[facility] [place]  Patient name: Erin Hayes MRN: [id number] DOB: 1965/09/12 Sex: female   REQUESTING PROVIDER:    hospitalists   REASON FOR CONSULT:    DVT/PE  HISTORY OF PRESENT ILLNESS:   Erin Hayes is a 57 y.o. female, who presented to the hospital with altered mental status and confusion.  This resolved spontaneously.  During her brain and neck imaging, PE was identified.  This led to a dedicated PET scan which shows a large distal right sided PE.  She also has DVT.  The patient has a prior history of PE and DVT a year ago and has been on Eliquis which she states she has been taking.  There is also concern of LV thrombus.  The patient is now fully anticoagulated with heparin.  The patient has a history of diabetes.  She takes a statin for hypercholesterolemia.  She is a former smoker  PAST MEDICAL HISTORY    Past Medical History:  Diagnosis Date   Carpal tunnel syndrome of right wrist    Chronic combined systolic (congestive) and diastolic (congestive) heart failure (HCC)    a. 03/2017 Echo: EF 20-25%, diff HK, Gr2 DD; b. 09/2017 Echo: EF 20-25%, diff HK, Gr2 DD.   Diabetes (HCC)    GERD (gastroesophageal reflux disease)    HLD (hyperlipidemia)    Hypertension    NICM (nonischemic cardiomyopathy) (HCC)    a. 03/2017 Echo: EF 20-25%, diff HK, Gr2 DD, mild to mod MR, nl RV fxn;  b. 03/2017 Cath: mild nonobs dzs, EF 25%; c. 09/2017 Echo: EF 20-25%, diff HK. Gr2 DD. Mild MR. Mildly reduced RV fxn.   Pleural effusion, left    a. 03/2017 s/p thoracentesis-->300 ml withdrawn--transudative.   Pneumonia 02/19/2017     FAMILY HISTORY   Family History  Problem Relation Age of Onset   Lung cancer Mother    Hypertension Father    CAD Father        a. MI age 74   Heart disease Father    COPD Neg Hx    Diabetes Mellitus II Neg Hx     SOCIAL HISTORY:   Social History   Socioeconomic History   Marital  status: Single    Spouse name: Not on file   Number of children: 3   Years of education: 12   Highest education level: 12th grade  Occupational History   Occupation: Arts development officer  Tobacco Use   Smoking status: Never   Smokeless tobacco: Never  Vaping Use   Vaping status: Never Used  Substance and Sexual Activity   Alcohol use: No   Drug use: No   Sexual activity: Never  Other Topics Concern   Not on file  Social History Narrative   Not on file   Social Determinants of Health   Financial Resource Strain: Low Risk  (12/07/2022)   Overall Financial Resource Strain (CARDIA)    Difficulty of Paying Living Expenses: Not hard at all  Food Insecurity: No Food Insecurity (12/07/2022)   Hunger Vital Sign    Worried About Running Out of Food in the Last Year: Never true    Ran Out of Food in the Last Year: Never true  Transportation Needs: No Transportation Needs (12/07/2022)   PRAPARE - Administrator, Civil Service (Medical): No    Lack of Transportation (Non-Medical): No  Physical Activity: Unknown (12/07/2022)   Exercise Vital Sign    Days of Exercise per Week:  2 days    Minutes of Exercise per Session: Not on file  Stress: No Stress Concern Present (12/07/2022)   [employer] of Occupational Health - Occupational Stress Questionnaire    Feeling of Stress : Not at all  Social Connections: Moderately Isolated (12/07/2022)   Social Connection and Isolation Panel [NHANES]    Frequency of Communication with Friends and Family: More than three times a week    Frequency of Social Gatherings with Friends and Family: More than three times a week    Attends Religious Services: More than 4 times per year    Active Member of [employer] or Organizations: No    Attends Banker Meetings: Never    Marital Status: Divorced  Catering manager Violence: Not At Risk (12/07/2022)   Humiliation, Afraid, Rape, and Kick questionnaire    Fear of Current or Ex-Partner: No    Emotionally  Abused: No    Physically Abused: No    Sexually Abused: No    ALLERGIES:    No Known Allergies  CURRENT MEDICATIONS:    Current Facility-Administered Medications  Medication Dose Route Frequency Provider Last Rate Last Admin   [START ON 04/08/2023]  stroke: early stages of recovery book   Does not apply Once Mansy, Jan A, MD       0.9 %  sodium chloride infusion   Intravenous Continuous Mansy, Vernetta Honey, MD   Held at 04/07/23 5784   acetaminophen (TYLENOL) tablet 650 mg  650 mg Oral Q6H PRN Mansy, Jan A, MD       Or   acetaminophen (TYLENOL) suppository 650 mg  650 mg Rectal Q6H PRN Mansy, Jan A, MD       albuterol (PROVENTIL) (2.5 MG/3ML) 0.083% nebulizer solution 2.5 mg  2.5 mg Nebulization Q4H PRN Otelia Sergeant, RPH       furosemide (LASIX) tablet 40 mg  40 mg Oral Daily Mansy, Jan A, MD   40 mg at 04/07/23 0940   heparin ADULT infusion 100 units/mL (25000 units/222mL)  750 Units/hr Intravenous Continuous Otelia Sergeant, RPH 7.5 mL/hr at 04/07/23 0554 750 Units/hr at 04/07/23 0554   hydrALAZINE (APRESOLINE) tablet 25 mg  25 mg Oral TID Mansy, Jan A, MD   25 mg at 04/07/23 6962   insulin aspart (novoLOG) injection 0-9 Units  0-9 Units Subcutaneous TID WC Marrion Coy, MD   1 Units at 04/07/23 1351   magnesium hydroxide (MILK OF MAGNESIA) suspension 30 mL  30 mL Oral Daily PRN Mansy, Jan A, MD       metoprolol succinate (TOPROL-XL) 24 hr tablet 25 mg  25 mg Oral Daily Mansy, Jan A, MD   25 mg at 04/07/23 0936   multivitamin with minerals tablet 1 tablet  1 tablet Oral Daily Mansy, Jan A, MD   1 tablet at 04/07/23 0944   ondansetron (ZOFRAN) tablet 4 mg  4 mg Oral Q6H PRN Mansy, Jan A, MD       Or   ondansetron [facility]) injection 4 mg  4 mg Intravenous Q6H PRN Mansy, Jan A, MD       rosuvastatin (CRESTOR) tablet 20 mg  20 mg Oral Daily Mansy, Jan A, MD   20 mg at 04/07/23 9528   sacubitril-valsartan (ENTRESTO) 97-103 mg per tablet  1 tablet Oral BID Sharman Cheek, MD   1 tablet at  04/07/23 0945   spironolactone (ALDACTONE) tablet 25 mg  25 mg Oral Daily Mansy, Vernetta Honey, MD   25 mg  at 04/07/23 0951   traZODone (DESYREL) tablet 25 mg  25 mg Oral QHS PRN Mansy, Vernetta Honey, MD       Current Outpatient Medications  Medication Sig Dispense Refill   albuterol (VENTOLIN HFA) 108 (90 Base) MCG/ACT inhaler Inhale 1-2 puffs into the lungs every 6 (six) hours as needed for wheezing or shortness of breath.     apixaban (ELIQUIS) 5 MG TABS tablet Take 5 mg by mouth 2 (two) times daily.     furosemide (LASIX) 40 MG tablet Take 1 tablet (40 mg total) by mouth daily. 90 tablet 3   glipiZIDE (GLUCOTROL XL) 5 MG 24 hr tablet Take 1 tablet (5 mg total) by mouth daily with breakfast. 90 tablet 0   hydrALAZINE (APRESOLINE) 25 MG tablet Take 1 tablet (25 mg total) by mouth 3 (three) times daily. 90 tablet 6   loperamide HCl (IMODIUM) 1 MG/7.5ML solution Take 15 mL (2 mg total) by mouth four (4) times a day as needed for diarrhea.     metoprolol succinate (TOPROL-XL) 25 MG 24 hr tablet Take 1 tablet (25 mg total) by mouth daily. 90 tablet 1   rosuvastatin (CRESTOR) 20 MG tablet Take 1 tablet (20 mg total) by mouth daily. 90 tablet 1   sacubitril-valsartan (ENTRESTO) 97-103 MG Take 1 tablet by mouth 2 (two) times daily. 180 tablet 1   spironolactone (ALDACTONE) 25 MG tablet Take 1 tablet (25 mg total) by mouth daily. 90 tablet 1   Multiple Vitamin (MULTIVITAMIN WITH MINERALS) TABS tablet Take 1 tablet by mouth daily.      REVIEW OF SYSTEMS:   [X]  denotes positive finding, [ ]  denotes negative finding Cardiac  Comments:  Chest pain or chest pressure:    Shortness of breath upon exertion:    Short of breath when lying flat:    Irregular heart rhythm:        Vascular    Pain in calf, thigh, or hip brought on by ambulation:    Pain in feet at night that wakes you up from your sleep:     Blood clot in your veins: x   Leg swelling:         Pulmonary    Oxygen at home:    Productive cough:      Wheezing:         Neurologic    Sudden weakness in arms or legs:     Sudden numbness in arms or legs:     Sudden onset of difficulty speaking or slurred speech:    Temporary loss of vision in one eye:     Problems with dizziness:         Gastrointestinal    Blood in stool:      Vomited blood:         Genitourinary    Burning when urinating:     Blood in urine:        Psychiatric    Major depression:         Hematologic    Bleeding problems:    Problems with blood clotting too easily:        Skin    Rashes or ulcers:        Constitutional    Fever or chills:     PHYSICAL EXAM:   Vitals:   04/07/23 1000 04/07/23 1030 04/07/23 1100 04/07/23 1411  BP: (!) 169/104 128/84 125/89 (!) 147/103  Pulse: 94 99 87 87  Resp: 19 20 (!) 21 16  Temp:      TempSrc:      SpO2: 100% 91% 97% 98%  Weight:        GENERAL: The patient is a well-nourished female, in no acute distress. The vital signs are documented above. CARDIAC: There is a regular rate and rhythm.  PULMONARY: Nonlabored respirations ABDOMEN: Soft and non-tender  MUSCULOSKELETAL: There are no major deformities or cyanosis. NEUROLOGIC: No focal weakness or paresthesias are detected. SKIN: There are no ulcers or rashes noted. PSYCHIATRIC: The patient has a normal affect.  STUDIES:   I have reviewed the following: CTA neck: 1. Acute distal left M4 occlusion, corresponding with the previously identified small left MCA distribution infarct. 2. Fetal type origin of the PCAs with overall diminutive vertebrobasilar system. 3. Diffuse tortuosity of the major arterial vasculature of the head and neck, suggesting chronic underlying hypertension. 4. Acute pulmonary embolism within the right main and partially visualized segmental pulmonary arteries. Further evaluation with dedicated CTA of the chest recommended for complete evaluation. 5. 1.5 cm nodule at the thyroid isthmus.  MRI brain: 1. Small acute cortical  infarct within the left insula and adjacent left parietal lobe. Petechial hemorrhage also present at this site. 2. Background chronic small vessel ischemic disease and chronic infarcts as outlined. 3. Chronic microhemorrhages within the supratentorial and infratentorial brain, in a distribution suggesting sequelae of chronic hypertensive microangiopathy.  CTA chest: 1. Marked severity right-sided pulmonary embolism with extension to involve multiple upper lobe branches. 2. Moderate to large pericardial effusion. 3. Thick curvilinear areas of low attenuation within the lumen of the left ventricle, which may represent areas of thrombus. Further evaluation with an echocardiogram and cardiology consultation is recommended. 4. Mild left lower lobe and right basilar atelectasis. 5. Evidence of prior cholecystectomy. 6. Aortic atherosclerosis.  Lower extremity Doppler: 1. Evidence of occlusive DVT within the LEFT femoral vein, LEFT posterior tibial vein and LEFT peroneal vein. 2. No evidence of DVT within the RIGHT lower extremity.   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  The patient states that she has been taking her medications suspect this would be a treatment failure.  Would recommend IV heparin for now and consultation with hematology for recommendations regarding medication change.  She is not having trouble with shortness of breath, is not tachycardic, and is not on oxygen, therefore I do not think that interventional treatment of her PE and DVT are indicated at this time.   Charlena Cross, MD, FACS [facility] [phone] Pager [phone]

## 2023-04-07 NOTE — Consult Note (Signed)
[facility] TeleNeurology Consult Services  Stat Consult  Patient Name:   Erin Hayes, Erin Hayes Date of Birth:   1966-04-16 Identification Number:   MRN - [id number] Date of Service:   04/07/2023 03:32:09  Diagnosis:       U72.536 - Cerebrovascular accident (CVA) due to embolism of left middle cerebral artery (HCCC)  Impression 57 year old female with past medical history of hypertension hyperlipidemia diabetes, DVT on Eliquis presented to the emergency room with an episode of transient confusion brought in by her daughter. She currently denies any focal weakness numbness changes to speech swallowing or vision. NIH 0. Her last dose of Eliquis was last night.  She is hypertensive with systolic in 180s to 200s. MRI of the brain revealed Small acute cortical infarct within the left insula and adjacent left parietal lobe with minimal petechial hemorrhage also present at this site. There are chronic microhemorrhages within the supratentorial and infratentorial brain, in a distribution suggesting sequelae of chronic hypertensive microangiopathy. CTA head and neck showed distal left m4 occlusion. I could not see a discussion about NIR in the notes, however given her being asymptomatic and distal occlusion, not a candidate for NIR.  CTA chest confirmed severity right-sided pulmonary embolism with extension to involve multiple upper lobe branches. Patient has an acute pulmonary embolus despite being compliant on Eliquis, with a history of PE, and there is possibility of LV thrombus. I discussed with the primary team that in the setting of an acute ischemic infarct and minimal petechial hemorrhage on his existing chronic microhemorrhages there is risk of intracranial hemorrhage extension, however this should be a risk versus benefit analysis. With the possibility of an LV thrombus, the patient should be anticoagulated with IV heparin. I discussed the risks with the patient.  She may be  started on heparin low intensity no bolus protocol  l please obtain 2decho to evaluate cardiac thrombi. Continue close monitoring and obtain 24-hour CT head      Recommendations: Our recommendations are outlined below.  Diagnostic Studies : CT without contrast at 24 hours  Laboratory Studies : Lipid panel I ordered Hemoglobin A1c  Antithrombotic Medication : Permissive hypertension, Antihypertensives with prn for first 24-48 hrs post stroke onset. If BP greater than 200/100 give Labetalol IV or Vasotec IV Statins for LDL goal less than 70  Nursing Recommendations : IV Fluids, avoid dextrose containing fluids, Maintain euglycemia Neuro checks q4 hrs x 24 hrs and then per shift Head of bed 30 degrees Continue with Telemetry  Consultations : Recommend Speech therapy if failed dysphagia screen Physical therapy/Occupational therapy  DVT Prophylaxis : Choice of Primary Team  Disposition : Neurology will follow    ----------------------------------------------------------------------------------------------------   Advanced Imaging: CTA Head and Neck Completed.  LVO:No  Patient in not a candidate for NIR    Metrics: Dispatch Time: 04/07/2023 03:28:59 Callback Response Time: 04/07/2023 03:32:38  Primary Provider Notified of Diagnostic Impression and Management Plan on: 04/07/2023 05:21:14     ----------------------------------------------------------------------------------------------------  Chief Complaint: TIA  History of Present Illness: Patient is a 57 year old Female. 62F pt being admitted for stroke, hospitalist requesting s/c hx Afib, pt is non-compliant with Eliquis, also has PEs, HTN, does not meds for HTN either. MRI shows petechial hemorrhages and chronic micro hemorrhages CTA does not show any hemorrhages would like to start heparin, want to see if it is safe to do so   Past Medical History:      Hypertension  Medications:  No  Anticoagulant use  No Antiplatelet  use Reviewed EMR for current medications  Allergies:  Reviewed  Social History: Drug Use: No  Family History:  There is no family history of premature cerebrovascular disease pertinent to this consultation  ROS : 14 Points Review of Systems was performed and was negative except mentioned in HPI.  Past Surgical History: There Is No Surgical History Contributory To Today's Visit   Examination: BP(180/105), Pulse(86), 1A: Level of Consciousness - Alert; keenly responsive + 0 1B: Ask Month and Age - Both Questions Right + 0 1C: Blink Eyes & Squeeze Hands - Performs Both Tasks + 0 2: Test Horizontal Extraocular Movements - Normal + 0 3: Test Visual Fields - No Visual Loss + 0 4: Test Facial Palsy (Use Grimace if Obtunded) - Normal symmetry + 0 5A: Test Left Arm Motor Drift - No Drift for 10 Seconds + 0 5B: Test Right Arm Motor Drift - No Drift for 10 Seconds + 0 6A: Test Left Leg Motor Drift - No Drift for 5 Seconds + 0 6B: Test Right Leg Motor Drift - No Drift for 5 Seconds + 0 7: Test Limb Ataxia (FNF/Heel-Shin) - No Ataxia + 0 8: Test Sensation - Normal; No sensory loss + 0 9: Test Language/Aphasia - Normal; No aphasia + 0 10: Test Dysarthria - Normal + 0 11: Test Extinction/Inattention - No abnormality + 0  NIHSS Score: 0  Spoke with : primary team    This consult was conducted in real time using interactive audio and Immunologist. Patient was informed of the technology being used for this visit and agreed to proceed. Patient located in hospital and provider located at home/office setting.  Patient is being evaluated for possible acute neurologic impairment and high probability of imminent or life - threatening deterioration.I spent total of 35 minutes providing care to this patient, including time for face to face visit via telemedicine, review of medical records, imaging studies and discussion of findings with providers, the  patient and / or family.   Dr Lerry Paterson   [facility] For Inpatient follow-up with [facility] physician please call [facility] at [phone]. As we are not an outpatient service for any post hospital discharge needs please contact the hospital for assistance.  If you have any questions for the [facility] physicians or need to reconsult for clinical or diagnostic changes please contact us via [facility] at [phone].

## 2023-04-07 NOTE — Progress Notes (Addendum)
ANTICOAGULATION CONSULT NOTE  Pharmacy Consult for heparin infusion Indication: pulmonary embolus  No Known Allergies  Patient Measurements: Height: 4\' 11"  (149.9 cm) Weight: 55.1 kg (121 lb 7.6 oz) IBW/kg (Calculated) : 43.2 Heparin Dosing Weight: 54.4 kg  Vital Signs: Temp: 98.5 F (36.9 C) (12/01 2101) Temp Source: Oral (12/01 2101) BP: 164/113 (12/01 2101) Pulse Rate: 83 (12/01 2101)  Labs: Recent Labs    04/06/23 1445 04/07/23 0447 04/07/23 1447 04/07/23 2154  HGB 13.0 12.3  --   --   HCT 39.6 38.0  --   --   PLT 231 331  --   --   APTT  --  26  --   --   LABPROT  --  14.2  --   --   INR  --  1.1  --   --   HEPARINUNFRC  --  <0.10* 0.12* 0.26*  CREATININE 1.10* 1.07*  --   --     Estimated Creatinine Clearance: 42.9 mL/min (A) (by C-G formula based on SCr of 1.07 mg/dL (H)).   Medical History: Past Medical History:  Diagnosis Date   Carpal tunnel syndrome of right wrist    Chronic combined systolic (congestive) and diastolic (congestive) heart failure (HCC)    a. 03/2017 Echo: EF 20-25%, diff HK, Gr2 DD; b. 09/2017 Echo: EF 20-25%, diff HK, Gr2 DD.   Diabetes (HCC)    GERD (gastroesophageal reflux disease)    HLD (hyperlipidemia)    Hypertension    NICM (nonischemic cardiomyopathy) (HCC)    a. 03/2017 Echo: EF 20-25%, diff HK, Gr2 DD, mild to mod MR, nl RV fxn;  b. 03/2017 Cath: mild nonobs dzs, EF 25%; c. 09/2017 Echo: EF 20-25%, diff HK. Gr2 DD. Mild MR. Mildly reduced RV fxn.   Pleural effusion, left    a. 03/2017 s/p thoracentesis-->300 ml withdrawn--transudative.   Pneumonia 02/19/2017    Medications:  PTA Meds: Eliquis 5 mg BID, last dose unknown  Assessment: Pt is a 57 yo female with h/o DVT on Eliquis, presenting to ED due to confusion that started abruptly at 12:30 PM today, lasting about an hour, found with "marked severity right-sided pulmonary embolism with extension to involve multiple upper lobe branches."  Per neurology note:  heparin  low intensity no bolus protocol  Goal of Therapy:  Heparin level 0.3-0.5 units/ml aPTT 66-85 seconds Monitor platelets by anticoagulation protocol: Yes   12/01@1447 : HL 0.12, subtherapeutic@750  units/hr 12/01@ 2154: HL 0.26, subtherapeutic@950  units/hr  Plan:  No boluses, per neurology Increase heparin infusion rate to 1000 units/hr Will check HL in 6 hr after rate change HL & CBC daily while on heparin  Merryl Hacker, PharmD Clinical Pharmacist 04/07/2023 10:15 PM

## 2023-04-07 NOTE — Assessment & Plan Note (Signed)
-   We will continue antihypertensive therapy with permissive parameters given acute CVA.

## 2023-04-07 NOTE — Progress Notes (Addendum)
Consult received for 2 IVs for heparin and blood pressure meds per primary RN.

## 2023-04-07 NOTE — Progress Notes (Signed)
OT Cancellation Note  Patient Details Name: Erin Hayes MRN: [id number] DOB: 1965-11-16   Cancelled Treatment:    Reason Eval/Treat Not Completed: Patient not medically ready. Consult received, chart reviewed. Pt with +PE, +DVT, and +CVA. Heparin drip initiated at 5:54am on 12/1. Vascular consulted and pending recommendations. Per therapy protocol, pt contraindicated at this time for therapy.    Arman Filter., MPH, MS, OTR/L ascom [phone] 04/07/23, 7:58 AM

## 2023-04-07 NOTE — Assessment & Plan Note (Signed)
-   We will continue statin therapy. 

## 2023-04-07 NOTE — ED Provider Notes (Signed)
-----------------------------------------   1:21 AM on 04/07/2023 -----------------------------------------   Delayed due to to radiology; spoke with radiologist regarding patient's CTA head/neck: very distal infarct not amenable to intervention.  Additionally, radiologist sees possible PEs; recommends CTA chest.   ----------------------------------------- 2:10 AM on 04/07/2023 -----------------------------------------   Discussed with radiologist CTA chest positive for PEs and pericardial effusion; patient will need echo.  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  LLE in boot for fracture.  Will consult hospitalist services for evaluation and admission.   ----------------------------------------- 3:10 AM on 04/07/2023 -----------------------------------------   Hospitalist requesting neurology consultation regarding initiation of IV heparin given the microhemorrhages seen on patient's MRI. Note hemorrhages not noted on CTA. Have consulted teleneurology. Also discussed with vascular surgery regarding need for urgent/emergent IVC filter - no need at this time as patient is not exhibiting tachycardia, take tachypnea nor hypoxia.  She is not complaining of chest pain or shortness of breath.  She is primarily being admitted for CVA symptoms.  Even if she is positive for DVT, no need for urgent intervention at this time.   ----------------------------------------- 3:25 AM on 04/07/2023 -----------------------------------------   Hospitalist spoke with teleneurology while in the room who recommends low-dose heparin drip without bolus.  CRITICAL CARE Performed by: Irean Hong   Total critical care time: 30 minutes  Critical care time was exclusive of separately billable procedures and treating other patients.  Critical care was necessary to treat or prevent imminent or life-threatening deterioration.  Critical care was time spent personally by me on the  following activities: development of treatment plan with patient and/or surrogate as well as nursing, discussions with consultants, evaluation of patient's response to treatment, examination of patient, obtaining history from patient or surrogate, ordering and performing treatments and interventions, ordering and review of laboratory studies, ordering and review of radiographic studies, pulse oximetry and re-evaluation of patient's condition.    Irean Hong, MD 04/07/23 [phone]

## 2023-04-07 NOTE — Assessment & Plan Note (Signed)
-   The patient will be placed on supplemental coverage with NovoLog. - We will continue Glucotrol XL.

## 2023-04-07 NOTE — Progress Notes (Signed)
SLP Cancellation Note  Patient Details Name: [name] MRN: [id number] DOB: [birth year]   Cancelled treatment:       Reason Eval/Treat Not Completed: SLP screened, no needs identified, will sign off (Per chart review, NIHSS = 0. No documented changes or subjective report of changes speech/language/cognition or swallowing.)  Erin Hayes, M.S., CCC-SLP Speech-Language Pathologist [facility] [phone] Arnette Felts)  [name] 04/07/2023, 9:35 AM

## 2023-04-07 NOTE — Progress Notes (Signed)
ANTICOAGULATION CONSULT NOTE  Pharmacy Consult for heparin infusion Indication: pulmonary embolus  No Known Allergies  Patient Measurements: Weight: 54.4 kg (120 lb) (From O/V note 03/19/23) Heparin Dosing Weight: 54.4 kg  Vital Signs: Temp: 98 F (36.7 C) (12/01 0745) Temp Source: Oral (12/01 0745) BP: 147/103 (12/01 1411) Pulse Rate: 87 (12/01 1411)  Labs: Recent Labs    04/06/23 1445 04/07/23 0447 04/07/23 1447  HGB 13.0 12.3  --   HCT 39.6 38.0  --   PLT 231 331  --   APTT  --  26  --   LABPROT  --  14.2  --   INR  --  1.1  --   HEPARINUNFRC  --  <0.10* 0.12*  CREATININE 1.10* 1.07*  --     Estimated Creatinine Clearance: 42.6 mL/min (A) (by C-G formula based on SCr of 1.07 mg/dL (H)).   Medical History: Past Medical History:  Diagnosis Date   Carpal tunnel syndrome of right wrist    Chronic combined systolic (congestive) and diastolic (congestive) heart failure (HCC)    a. 03/2017 Echo: EF 20-25%, diff HK, Gr2 DD; b. 09/2017 Echo: EF 20-25%, diff HK, Gr2 DD.   Diabetes (HCC)    GERD (gastroesophageal reflux disease)    HLD (hyperlipidemia)    Hypertension    NICM (nonischemic cardiomyopathy) (HCC)    a. 03/2017 Echo: EF 20-25%, diff HK, Gr2 DD, mild to mod MR, nl RV fxn;  b. 03/2017 Cath: mild nonobs dzs, EF 25%; c. 09/2017 Echo: EF 20-25%, diff HK. Gr2 DD. Mild MR. Mildly reduced RV fxn.   Pleural effusion, left    a. 03/2017 s/p thoracentesis-->300 ml withdrawn--transudative.   Pneumonia 02/19/2017    Medications:  PTA Meds: Eliquis 5 mg BID, last dose unknown  Assessment: Pt is a 58 yo female with h/o DVT on Eliquis, presenting to ED due to confusion that started abruptly at 12:30 PM today, lasting about an hour, found with "marked severity right-sided pulmonary embolism with extension to involve multiple upper lobe branches."  Per neurology note:  heparin low intensity no bolus protocol  Goal of Therapy:  Heparin level 0.3-0.5 units/ml aPTT 66-85  seconds Monitor platelets by anticoagulation protocol: Yes   12/01@1447 : HL 0.12, subtherapeutic@750  units/hr   Plan:  No boluses, per neurology Start heparin infusion at 950 units/hr Will follow aPTT until correlation w/ HL confirmed Will check aPTT in 6 hr after rate change HL & CBC daily while on heparin  Bettey Costa, PharmD Clinical Pharmacist 04/07/2023 3:12 PM

## 2023-04-07 NOTE — Assessment & Plan Note (Signed)
repleted   Serial BMP 

## 2023-04-07 NOTE — Hospital Course (Signed)
Erin Hayes is a 57 y.o. female with medical history significant for chronic combined systolic and diastolic CHF, GERD, hypertension, dyslipidemia and nonischemic cardiomyopathy, who presented to the emergency room with acute onset of altered mental status with confusion.  Patient had a history of PE/DVT a year ago, patient also had a left lower extremity fracture 4 months ago.  MRI of the brain showed small acute cortical infarct within the left insular, CT angiogram of the neck and head showed acute distal left M4 occlusion. CT angiogram of chest showed severe right sided pulmonary embolism with extension to multiple upper lobes branches.  Duplex ultrasound showed extensive left lower extremity DVT. Patient has been seen by teleneurology, started on heparin drip. Patient is seen by vascular surgery, no need for intervention.  She was also seen by hematology, she did not fail Eliquis treatment, she was not compliant.  At this point, treatment will be changed to Xarelto.

## 2023-04-07 NOTE — ED Notes (Signed)
Teleneurology  called per Dr.Sung spoke with Tyrus  for a teleneurology consult

## 2023-04-07 NOTE — Assessment & Plan Note (Signed)
-   The patient was on p.o. Eliquis. - Given current right-sided acute PE, will place him on heparin if cleared by neurology.

## 2023-04-07 NOTE — Assessment & Plan Note (Addendum)
-   The patient will be admitted to an observation medically monitored bed.   - We will follow neuro checks q.4 hours for 24 hours.   - The patient will be placed on aspirin.   - Will obtain a 2D echo with bubble study .   - A neurology consultation  as well as physical/occupation/speech therapy consults will be obtained in a.m.[name]   - The patient will be placed on statin therapy and fasting lipids will be checked.

## 2023-04-07 NOTE — Progress Notes (Signed)
PT Cancellation Note  Patient Details Name: Erin Hayes MRN: [id number] DOB: 21-Jan-1966   Cancelled Treatment:    Reason Eval/Treat Not Completed: Patient not medically ready PT orders received, chart reviewed. Pt noted to have acute CVA, + for PE, and LLE DVT. Pt started on IV heparin ~2 hours ago. Per protocol, will hold PT evaluation at this time.  Aleda Grana, PT, DPT 04/07/23, 7:57 AM   Sandi Mariscal 04/07/2023, 7:56 AM

## 2023-04-08 ENCOUNTER — Other Ambulatory Visit (HOSPITAL_COMMUNITY): Payer: Self-pay

## 2023-04-08 ENCOUNTER — Encounter: Payer: Self-pay | Admitting: Family Medicine

## 2023-04-08 ENCOUNTER — Telehealth (HOSPITAL_COMMUNITY): Payer: Self-pay | Admitting: Pharmacy Technician

## 2023-04-08 DIAGNOSIS — I6389 Other cerebral infarction: Secondary | ICD-10-CM

## 2023-04-08 DIAGNOSIS — I639 Cerebral infarction, unspecified: Secondary | ICD-10-CM | POA: Diagnosis not present

## 2023-04-08 DIAGNOSIS — I5042 Chronic combined systolic (congestive) and diastolic (congestive) heart failure: Secondary | ICD-10-CM | POA: Diagnosis not present

## 2023-04-08 DIAGNOSIS — I2699 Other pulmonary embolism without acute cor pulmonale: Secondary | ICD-10-CM | POA: Diagnosis not present

## 2023-04-08 DIAGNOSIS — I5022 Chronic systolic (congestive) heart failure: Secondary | ICD-10-CM

## 2023-04-08 LAB — CBC
HCT: 39 % (ref 36.0–46.0)
Hemoglobin: 12.9 g/dL (ref 12.0–15.0)
MCH: 27.6 pg (ref 26.0–34.0)
MCHC: 33.1 g/dL (ref 30.0–36.0)
MCV: 83.5 fL (ref 80.0–100.0)
Platelets: 345 10*3/uL (ref 150–400)
RBC: 4.67 MIL/uL (ref 3.87–5.11)
RDW: 13.9 % (ref 11.5–15.5)
WBC: 10.3 10*3/uL (ref 4.0–10.5)
nRBC: 0 % (ref 0.0–0.2)

## 2023-04-08 LAB — HEPARIN LEVEL (UNFRACTIONATED)
Heparin Unfractionated: 0.48 [IU]/mL (ref 0.30–0.70)
Heparin Unfractionated: 0.51 [IU]/mL (ref 0.30–0.70)

## 2023-04-08 LAB — BASIC METABOLIC PANEL
Anion gap: 9 (ref 5–15)
BUN: 25 mg/dL — ABNORMAL HIGH (ref 6–20)
CO2: 22 mmol/L (ref 22–32)
Calcium: 7.9 mg/dL — ABNORMAL LOW (ref 8.9–10.3)
Chloride: 105 mmol/L (ref 98–111)
Creatinine, Ser: 1.36 mg/dL — ABNORMAL HIGH (ref 0.44–1.00)
GFR, Estimated: 45 mL/min — ABNORMAL LOW (ref >60)
Glucose, Bld: 140 mg/dL — ABNORMAL HIGH (ref 70–99)
Potassium: 4.1 mmol/L (ref 3.5–5.1)
Sodium: 136 mmol/L (ref 135–145)

## 2023-04-08 LAB — GLUCOSE, CAPILLARY
Glucose-Capillary: 137 mg/dL — ABNORMAL HIGH (ref 70–99)
Glucose-Capillary: 137 mg/dL — ABNORMAL HIGH (ref 70–99)
Glucose-Capillary: 173 mg/dL — ABNORMAL HIGH (ref 70–99)

## 2023-04-08 LAB — PHOSPHORUS: Phosphorus: 3 mg/dL (ref 2.5–4.6)

## 2023-04-08 LAB — MAGNESIUM: Magnesium: 1.7 mg/dL (ref 1.7–2.4)

## 2023-04-08 NOTE — Progress Notes (Signed)
Heart Failure Nurse Navigator Progress Note  PCP: Lorre Munroe, NP PCP-Cardiologist: Julien Nordmann, MD Admission Diagnosis:  Acute ischemic stroke Chronic atrial fibrillation Type 2 diabetes mellitus without complication, without long term current use of insulin Admitted from: Home  Presentation:   Erin Hayes presented with confusion that started abruptly, lasted about an hour and then resolved.  ECHO/ LVEF: 20-25%  Clinical Course:  Past Medical History:  Diagnosis Date   Carpal tunnel syndrome of right wrist    Chronic combined systolic (congestive) and diastolic (congestive) heart failure (HCC)    a. 03/2017 Echo: EF 20-25%, diff HK, Gr2 DD; b. 09/2017 Echo: EF 20-25%, diff HK, Gr2 DD.   Diabetes (HCC)    GERD (gastroesophageal reflux disease)    HLD (hyperlipidemia)    Hypertension    NICM (nonischemic cardiomyopathy) (HCC)    a. 03/2017 Echo: EF 20-25%, diff HK, Gr2 DD, mild to mod MR, nl RV fxn;  b. 03/2017 Cath: mild nonobs dzs, EF 25%; c. 09/2017 Echo: EF 20-25%, diff HK. Gr2 DD. Mild MR. Mildly reduced RV fxn.   Pleural effusion, left    a. 03/2017 s/p thoracentesis-->300 ml withdrawn--transudative.   Pneumonia 02/19/2017     Social History   Socioeconomic History   Marital status: Single    Spouse name: Not on file   Number of children: 3   Years of education: 12   Highest education level: 12th grade  Occupational History   Occupation: Arts development officer  Tobacco Use   Smoking status: Never   Smokeless tobacco: Never  Vaping Use   Vaping status: Never Used  Substance and Sexual Activity   Alcohol use: No   Drug use: No   Sexual activity: Never  Other Topics Concern   Not on file  Social History Narrative   Not on file   Social Determinants of Health   Financial Resource Strain: Low Risk  (04/08/2023)   Overall Financial Resource Strain (CARDIA)    Difficulty of Paying Living Expenses: Not hard at all  Food Insecurity: No Food Insecurity (12/07/2022)    Hunger Vital Sign    Worried About Running Out of Food in the Last Year: Never true    Ran Out of Food in the Last Year: Never true  Transportation Needs: No Transportation Needs (04/08/2023)   PRAPARE - Administrator, Civil Service (Medical): No    Lack of Transportation (Non-Medical): No  Physical Activity: Unknown (12/07/2022)   Exercise Vital Sign    Days of Exercise per Week: 2 days    Minutes of Exercise per Session: Not on file  Stress: No Stress Concern Present (12/07/2022)   [employer] of Occupational Health - Occupational Stress Questionnaire    Feeling of Stress : Not at all  Social Connections: Moderately Isolated (12/07/2022)   Social Connection and Isolation Panel [NHANES]    Frequency of Communication with Friends and Family: More than three times a week    Frequency of Social Gatherings with Friends and Family: More than three times a week    Attends Religious Services: More than 4 times per year    Active Member of [employer] or Organizations: No    Attends Engineer, structural: Never    Marital Status: Divorced   Water engineer and Provision:  Detailed education and instructions provided on heart failure disease management including the following:  Signs and symptoms of Heart Failure When to call the physician Importance of daily weights Low sodium diet  Fluid restriction Medication management Anticipated future follow-up appointments  Patient education given on each of the above topics.  Patient acknowledges understanding via teach back method and acceptance of all instructions.  Education Materials:  "Living Better With Heart Failure" Booklet, HF zone tool, & Daily Weight Tracker Tool.  Patient has scale at home: No-her daughter Joice Lofts will get her one. Patient has pill box at home: Yes    High Risk Criteria for Readmission and/or Poor Patient Outcomes: Heart failure hospital admissions (last 6 months): 1  No Show rate:  26% Difficult social situation: None Demonstrates medication adherence: Yes Primary Language: English Literacy level: Reading, Writing, and Comprehension  Barriers of Care:   Diet & Fluid Restrictions Daily Weights Medication Compliance  Considerations/Referrals:   Referral made to Heart Failure Pharmacist Stewardship: Yes Referral made to Heart Failure CSW/NCM TOC: No Referral made to Heart & Vascular TOC clinic: Yes-hospital follow-up appointment 04/15/23 @ 3:30 pm.  Patient and daughter Restaurant manager, fast food" aware of appointment time and location.   Items for Follow-up on DC/TOC: Diet & Fluid Restrictions Daily Weights Medication Compliance Continued Heart Failure Education  Roxy Horseman, RN, BSN [facility] Heart Failure Navigator Secure Chat Only

## 2023-04-08 NOTE — Evaluation (Signed)
Physical Therapy Evaluation Patient Details Name: Erin Hayes MRN: [id number] DOB: [birth year] Today's Date: 04/08/2023  History of Present Illness  Pt is 57 y/o admitted 04/06/23 for Acute CVA. Pt presented to hospital with reports of altered mental status/confusion and abdominal pain. PmHx includes: HTN, GERD, systolic an ddiastolic CHF, dyslipidemia, and nonischemic cardiomyopathy.   Clinical Impression  Pt received in bed and agreed to PT session. Pt reported that she does not have any WB precautions for the boot placed on her LLE. Pt performed bed mobility ModI, STS with the use of RW (2wheels) SUP, and amb with RW ~81ft CGA. VC necessary during session for RW management. Pt ended session in recliner following amb due to fatigue with RN present. Pt tolerated Tx well and will continue to benefit from skilled PT sessions to improve activity tolerance and functional mobility to maximize safety/return to PLOF following D/C. Communication with MD occurred via Secure Chat for Tx approval.      If plan is discharge home, recommend the following: A little help with walking and/or transfers;Help with stairs or ramp for entrance;Supervision due to cognitive status   Can travel by private vehicle        Equipment Recommendations Rolling walker (2 wheels);BSC/3in1  Recommendations for Other Services       Functional Status Assessment Patient has had a recent decline in their functional status and demonstrates the ability to make significant improvements in function in a reasonable and predictable amount of time.     Precautions / Restrictions Precautions Precautions: Fall Restrictions Weight Bearing Restrictions: No      Mobility  Bed Mobility Overal bed mobility: Modified Independent             General bed mobility comments: Pt performed bed mobility ModI and did not report any s/sx relative to dizziness while seated EOB.    Transfers Overall transfer level: Needs  assistance Equipment used: Rolling walker (2 wheels) Transfers: Sit to/from Stand Sit to Stand: Supervision           General transfer comment: Pt performed STS with the use of RW (2wheels) SUP. Pt did not report any s/sx relative to dizziness when standing.    Ambulation/Gait Ambulation/Gait assistance: Contact guard assist Gait Distance (Feet): 40 Feet Assistive device: Rolling walker (2 wheels) Gait Pattern/deviations: Step-through pattern Gait velocity: decreased     General Gait Details: Pt amb with the use of RW (2wheels) CGA. VC necessary for RW management.  Stairs            Wheelchair Mobility     Tilt Bed    Modified Rankin (Stroke Patients Only)       Balance Overall balance assessment: Needs assistance Sitting-balance support: Feet supported Sitting balance-Leahy Scale: Good     Standing balance support: Bilateral upper extremity supported, During functional activity Standing balance-Leahy Scale: Fair                               Pertinent Vitals/Pain Pain Assessment Pain Assessment: No/denies pain    Home Living Family/patient expects to be discharged to:: Private residence Living Arrangements: Children (Daughter and granddaughter) Available Help at Discharge: Family;Available 24 hours/day Type of Home: House Home Access: Ramped entrance       Home Layout: One level Home Equipment: Shower seat;Wheelchair - manual      Prior Function Prior Level of Function : Independent/Modified Independent  Mobility Comments: Pt reports IND prior to admission. Pt states using WC for long distance mobility due to developed fatigue. ADLs Comments: Pt reports IND prior to admission     Extremity/Trunk Assessment   Upper Extremity Assessment Upper Extremity Assessment: Overall WFL for tasks assessed    Lower Extremity Assessment Lower Extremity Assessment: Overall WFL for tasks assessed;LLE deficits/detail LLE  Deficits / Details: Pt arrived to hospital with LLE fracture and has been recovering. Pt uses a boot that does not have any specific WB precautions. Pt reports that LLE has been improving       Communication   Communication Communication: No apparent difficulties Cueing Techniques: Verbal cues  Cognition Arousal: Alert Behavior During Therapy: WFL for tasks assessed/performed Overall Cognitive Status: Within Functional Limits for tasks assessed                                 General Comments: AOx4. Pt pleasant and willing to participate in PT session.        General Comments      Exercises     Assessment/Plan    PT Assessment Patient needs continued PT services  PT Problem List Decreased activity tolerance;Decreased mobility;Decreased knowledge of use of DME       PT Treatment Interventions DME instruction;Gait training;Functional mobility training;Therapeutic activities;Therapeutic exercise    PT Goals (Current goals can be found in the Care Plan section)  Acute Rehab PT Goals Patient Stated Goal: To go home PT Goal Formulation: With patient Time For Goal Achievement: 04/22/23 Potential to Achieve Goals: Good    Frequency Min 1X/week     Co-evaluation               AM-PAC PT "6 Clicks" Mobility  Outcome Measure Help needed turning from your back to your side while in a flat bed without using bedrails?: None Help needed moving from lying on your back to sitting on the side of a flat bed without using bedrails?: None Help needed moving to and from a bed to a chair (including a wheelchair)?: A Little Help needed standing up from a chair using your arms (e.g., wheelchair or bedside chair)?: A Little Help needed to walk in hospital room?: A Little Help needed climbing 3-5 steps with a railing? : A Little 6 Click Score: 20    End of Session Equipment Utilized During Treatment: Gait belt Activity Tolerance: Patient tolerated treatment  well Patient left: in chair;with call bell/phone within reach;with chair alarm set;with nursing/sitter in room Nurse Communication: Mobility status PT Visit Diagnosis: Other abnormalities of gait and mobility (R26.89);Difficulty in walking, not elsewhere classified (R26.2)    Time: 0950-1008 PT Time Calculation (min) (ACUTE ONLY): 18 min   Charges:                 Roi Jafari Sauvignon Howard SPT, LAT, ATC  Cloy Cozzens Sauvignon-Howard 04/08/2023, 11:06 AM

## 2023-04-08 NOTE — Progress Notes (Signed)
Heart Failure Stewardship Pharmacy Note  PCP: Lorre Munroe, NP PCP-Cardiologist: Julien Nordmann, MD  HPI: Erin Hayes is a 57 y.o. female with chronic combined systolic and diastolic CHF, GERD, hypertension, T2DM, dyslipidemia and nonischemic cardiomyopathy who presented with acute onset AMS. CT head 03/27/23 showed acute distal M4 occlusion. CTA PE 04/07/23 showed right -sided PE with extension to multiple upper lobe branches, moderate to large pericardial effusion. Echo with bubble study performed on 04/07/23 showing LVEF of 20-25%, mild concentric LVH, grade I diastolic dysfunction, normal RV function, and negative bubble study. Reports that Entresto and spironolactone were stopped her most recent hospital admission and she had been off of it for a few months before recently restarting at her last outpatient visit.  Pertinent cardiac history: Echo in 03/2017 showed LVEF of 20-25% with grade II diastolic dysfunction. LHC during the admission showed mild nonobstructive CAD. LVEF was unchanged on echo in 09/2017. LVEF improved to 45-50% in 05/2019. Stress test in 11/2022 showed LVEF normal and perfusion defects representative of LBBB.   Pertinent Lab Values: Creat  Date Value Ref Range Status  01/31/2023 0.81 0.50 - 1.03 mg/dL Final   Creatinine, Ser  Date Value Ref Range Status  04/08/2023 1.36 (H) 0.44 - 1.00 mg/dL Final   BUN  Date Value Ref Range Status  04/08/2023 25 (H) 6 - 20 mg/dL Final  40/98/1191 14 6 - 24 mg/dL Final  47/82/9562 17 7 - 18 mg/dL Final   Potassium  Date Value Ref Range Status  04/08/2023 4.1 3.5 - 5.1 mmol/L Final  04/25/2013 5.7 (H) 3.5 - 5.1 mmol/L Final   Sodium  Date Value Ref Range Status  04/08/2023 136 135 - 145 mmol/L Final  06/19/2018 142 134 - 144 mmol/L Final  04/25/2013 129 (L) 136 - 145 mmol/L Final   B Natriuretic Peptide  Date Value Ref Range Status  04/25/2022 653.8 (H) 0.0 - 100.0 pg/mL Final    Comment:    Performed at [facility], [address], [place], [place] [address]   Magnesium  Date Value Ref Range Status  04/08/2023 1.7 1.7 - 2.4 mg/dL Final    Comment:    Performed at [facility], [address] [place], [place] [address]   Hgb A1c MFr Bld  Date Value Ref Range Status  01/31/2023 7.8 (H) <5.7 % of total Hgb Final    Comment:    For someone without known diabetes, a hemoglobin A1c value of 6.5% or greater indicates that they may have  diabetes and this should be confirmed with a follow-up  test. . For someone with known diabetes, a value <7% indicates  that their diabetes is well controlled and a value  greater than or equal to 7% indicates suboptimal  control. A1c targets should be individualized based on  duration of diabetes, age, comorbid conditions, and  other considerations. . Currently, no consensus exists regarding use of hemoglobin A1c for diagnosis of diabetes for children. .    TSH  Date Value Ref Range Status  04/04/2017 0.975 0.350 - 4.500 uIU/mL Final    Comment:    Performed by a 3rd Generation assay with a functional sensitivity of <=0.01 uIU/mL.    Vital Signs: Admission weight: 121.47 lbs Temp:  [98.3 F (36.8 C)-99.5 F (37.5 C)] 98.4 F (36.9 C) (12/02 0735) Pulse Rate:  [76-99] 77 (12/02 0735) Cardiac Rhythm: Normal sinus rhythm (12/01 2059) Resp:  [15-27] 16 (12/02 0735) BP: (96-169)/(78-128) 139/93 (12/02 0735) SpO2:  [  91 %-100 %] 100 % (12/02 0735) Weight:  [55.1 kg (121 lb 7.6 oz)] 55.1 kg (121 lb 7.6 oz) (12/01 2101)  Intake/Output Summary (Last 24 hours) at 04/08/2023 0810 Last data filed at 04/08/2023 0272 Gross per 24 hour  Intake 334.68 ml  Output --  Net 334.68 ml    Current Heart Failure Medications:  Loop diuretic: none Beta-Blocker: metoprolol succinate 25 mg daily ACEI/ARB/ARNI: Entresto 97-103 mg bid MRA: none SGLT2i: none   Prior to admission Heart Failure Medications:  Loop diuretic: furosemide 40 mg  daily Beta-Blocker: metoprolol succinate 25 mg daily ACEI/ARB/ARNI: Entresto 97-103 mg bid MRA: none SGLT2i: none Other: hydralazine 25 mg TID  Assessment: 1. Acute on chronic combined systolic and diastolic heart failure (LVEF initially recovered, but now 20-25%) with grade I diastolic dysfunction, due to NICM. NYHA class II symptoms.  -Symptoms: Denies any HF symptoms today. -Volume: Appears euvolemic. Suspect AKI on admission could be due to hypovolemia with recent restart of furosemide along with other HF medications. Received furosemide yesterday, but stopped today. -Hemodynamics: BP stable after restarting Entresto 97-103 mg BID -BB: Continue current metoprolol dose -ACEI/ARB/ARNI: Continue Entresto 97-103 mg BID -MRA: Agree with holding MRA for now given AKI. This can likely be restarted when creatinine normalizes off furosemide -SGLT2i: Would likely benefit from its addition for HF, DM, and CKD. Would consider adding after creatinine begins to trend down. -Other: hydralazine 25 mg TID. Can consider adding isosorbide to approximate BiDil.  Plan: 1) Medication changes recommended at this time: -Would recommend stopping oral furosemide at discharge -Consider adding isosorbide ER 30 mg daily to current hydralazine to approximate BiDil.  2) Patient assistance: -Pending  3) Education: - Patient has been educated on current HF medications and potential additions to HF medication regimen - Patient verbalizes understanding that over the next few months, these medication doses may change and more medications may be added to optimize HF regimen - Patient has been educated on basic disease state pathophysiology and goals of therapy  Medication Assistance / Insurance Benefits Check: Does the patient have prescription insurance?    Type of insurance plan:  Does the patient qualify for medication assistance through manufacturers or grants? Pending   Outpatient Pharmacy: Prior to  admission outpatient pharmacy: [facility]     Please do not hesitate to reach out with questions or concerns,  Enos Fling, PharmD, CPP, BCPS Heart Failure Pharmacist  Phone - [phone] 04/08/2023 10:23 AM

## 2023-04-08 NOTE — Evaluation (Signed)
Occupational Therapy Evaluation Patient Details Name: Erin Hayes MRN: [id number] DOB: 1965/06/22 Today's Date: 04/08/2023   History of Present Illness Pt is 57 y/o admitted 04/06/23 for Acute CVA. Pt presented to hospital with reports of altered mental status/confusion and abdominal pain. PmHx includes: HTN, GERD, systolic an ddiastolic CHF, dyslipidemia, and nonischemic cardiomyopathy.   Clinical Impression   Patient presenting with decreased Ind in self care,balance, functional mobility/transfers, endurance, and safety awareness. Patient reports being Ind with use of wheelchair for long distances and being Ind with self care needs. She lives with daughter and granddaughter who assist as needed but daughter possibly works outside of the home. Patient currently functioning at supervision for sit <>Stand. Pt ambulating with RW and min guard secondary to pt displaying R inattention and bumping into all doorways and objects on R side with mobility tasks to locate bathroom. Pt having difficulty managing/coordinating use of R UE for hygiene while in standing without cuing for tasks. Pt would need 24/7 supervision at home secondary to R inattention and safety concerns. Patient will benefit from acute OT to increase overall independence in the areas of ADLs, functional mobility, and safety awareness in order to safely discharge.      If plan is discharge home, recommend the following: Supervision due to cognitive status;Other (comment) (supervision secondary to visual deficits)    Functional Status Assessment  Patient has had a recent decline in their functional status and demonstrates the ability to make significant improvements in function in a reasonable and predictable amount of time.  Equipment Recommendations  None recommended by OT       Precautions / Restrictions Precautions Precautions: Fall Restrictions Weight Bearing Restrictions: No      Mobility Bed Mobility                General bed mobility comments: Pt seated in recliner chair upon entering the room    Transfers Overall transfer level: Needs assistance Equipment used: Rolling walker (2 wheels) Transfers: Sit to/from Stand Sit to Stand: Supervision                  Balance Overall balance assessment: Needs assistance Sitting-balance support: Feet supported Sitting balance-Leahy Scale: Good     Standing balance support: Bilateral upper extremity supported, During functional activity Standing balance-Leahy Scale: Fair                             ADL either performed or assessed with clinical judgement   ADL Overall ADL's : Needs assistance/impaired     Grooming: Wash/dry hands;Standing;Supervision/safety                   Toilet Transfer: Supervision/safety;Regular Toilet;Rolling walker (2 wheels)   Toileting- Clothing Manipulation and Hygiene: Sit to/from stand;Contact guard assist;Minimal assistance               Vision Patient Visual Report: No change from baseline Vision Assessment?: Yes Additional Comments: Pt with R inattention throughout session and bumping into all objects on R side when            Pertinent Vitals/Pain Pain Assessment Pain Assessment: No/denies pain     Extremity/Trunk Assessment Upper Extremity Assessment Upper Extremity Assessment: Right hand dominant;RUE deficits/detail RUE Deficits / Details: appears WFLs during testing but apraxia noted during self care tasks   Lower Extremity Assessment Lower Extremity Assessment: Overall WFL for tasks assessed;LLE deficits/detail LLE Deficits / Details: Pt arrived to hospital  with LLE fracture and has been recovering. Pt uses a boot that does not have any specific WB precautions. Pt reports that LLE has been improving       Communication Communication Communication: No apparent difficulties Cueing Techniques: Verbal cues   Cognition Arousal: Alert Behavior During Therapy: WFL  for tasks assessed/performed Overall Cognitive Status: Within Functional Limits for tasks assessed                                 General Comments: AOx4. Pt pleasant and willing to participate in PT session.                Home Living Family/patient expects to be discharged to:: Private residence Living Arrangements: Children Available Help at Discharge: Family;Available 24 hours/day Type of Home: House Home Access: Ramped entrance     Home Layout: One level     Bathroom Shower/Tub: Chief Strategy Officer: Standard     Home Equipment: Shower seat;Wheelchair - manual          Prior Functioning/Environment Prior Level of Function : Independent/Modified Independent;Driving             Mobility Comments: Pt reports IND prior to admission. Pt states using WC for long distance mobility due to developed fatigue. ADLs Comments: Pt reports IND prior to admission        OT Problem List: Decreased strength;Decreased activity tolerance;Decreased safety awareness;Impaired balance (sitting and/or standing);Decreased knowledge of use of DME or AE      OT Treatment/Interventions: Self-care/ADL training;Therapeutic exercise;Therapeutic activities;Energy conservation;DME and/or AE instruction;Patient/family education;Balance training    OT Goals(Current goals can be found in the care plan section) Acute Rehab OT Goals Patient Stated Goal: to go home OT Goal Formulation: With patient Time For Goal Achievement: 04/22/23 Potential to Achieve Goals: Fair ADL Goals Pt Will Perform Grooming: with supervision;standing Pt Will Perform Lower Body Dressing: with supervision;sit to/from stand Pt Will Transfer to Toilet: with supervision Pt Will Perform Toileting - Clothing Manipulation and hygiene: with supervision  OT Frequency: Min 1X/week       AM-PAC OT "6 Clicks" Daily Activity     Outcome Measure Help from another person eating meals?: A  Little Help from another person taking care of personal grooming?: A Little Help from another person toileting, which includes using toliet, bedpan, or urinal?: A Little Help from another person bathing (including washing, rinsing, drying)?: A Little Help from another person to put on and taking off regular upper body clothing?: A Little Help from another person to put on and taking off regular lower body clothing?: A Little 6 Click Score: 18   End of Session Equipment Utilized During Treatment: Rolling walker (2 wheels) Nurse Communication: Mobility status  Activity Tolerance: Patient tolerated treatment well Patient left: in bed;with call bell/phone within reach;with bed alarm set  OT Visit Diagnosis: Unsteadiness on feet (R26.81);Repeated falls (R29.6);Muscle weakness (generalized) (M62.81)                Time: 4098-1191 OT Time Calculation (min): 15 min Charges:  OT General Charges $OT Visit: 1 Visit OT Evaluation $OT Eval Low Complexity: 1 Low OT Treatments $Self Care/Home Management : 8-22 mins Jackquline Denmark, MS, OTR/L , CBIS ascom [phone]  04/08/23, 2:33 PM

## 2023-04-08 NOTE — Consult Note (Signed)
[facility]  [phone] [phone]  ID: Erin Hayes OB: December 18, 1965  MR#: [id number]  OZD#:[id number]  Patient Care Team: Lorre Munroe, NP as PCP - General (Internal Medicine) Antonieta Iba, MD as PCP - Cardiology (Cardiology) Antonieta Iba, MD as Consulting Physician (Cardiology) Delma Freeze, FNP as Nurse Practitioner (Family Medicine)  CHIEF COMPLAINT: Pulmonary embolism.  INTERVAL HISTORY: Patient is a 57 year old female who was recently admitted to the hospital with altered mental status thought to be secondary to a CVA and was noted to have large right-sided pulmonary embolism.  Patient is a poor historian and although previously stated she was taking Eliquis, she now states she has not taken any anticoagulation for several months.  She currently feels well and is nearly back to her baseline.  She has no current neurologic complaints.  She denies any recent fevers or illnesses.  She has a good appetite and denies weight loss.  She has chronic shortness of breath.  She denies any chest pain, cough, or hemoptysis.  She has no nausea, vomiting, constipation, or diarrhea.  She has no urinary complaints.  Patient offers no further specific complaints today.  REVIEW OF SYSTEMS:   Review of Systems  Constitutional:  Positive for malaise/fatigue. Negative for fever and weight loss.  Respiratory:  Positive for shortness of breath. Negative for cough and hemoptysis.   Cardiovascular: Negative.  Negative for chest pain and leg swelling.  Gastrointestinal: Negative.  Negative for abdominal pain, blood in stool and melena.  Genitourinary: Negative.  Negative for dysuria.  Musculoskeletal: Negative.  Negative for back pain.  Skin: Negative.  Negative for rash.  Neurological: Negative.  Negative for dizziness, focal weakness, weakness and headaches.  Psychiatric/Behavioral: Negative.  The patient is not nervous/anxious.     As per HPI.  Otherwise, a complete review of systems is negative.  PAST MEDICAL HISTORY: Past Medical History:  Diagnosis Date   Carpal tunnel syndrome of right wrist    Chronic combined systolic (congestive) and diastolic (congestive) heart failure (HCC)    a. 03/2017 Echo: EF 20-25%, diff HK, Gr2 DD; b. 09/2017 Echo: EF 20-25%, diff HK, Gr2 DD.   Diabetes (HCC)    GERD (gastroesophageal reflux disease)    HLD (hyperlipidemia)    Hypertension    NICM (nonischemic cardiomyopathy) (HCC)    a. 03/2017 Echo: EF 20-25%, diff HK, Gr2 DD, mild to mod MR, nl RV fxn;  b. 03/2017 Cath: mild nonobs dzs, EF 25%; c. 09/2017 Echo: EF 20-25%, diff HK. Gr2 DD. Mild MR. Mildly reduced RV fxn.   Pleural effusion, left    a. 03/2017 s/p thoracentesis-->300 ml withdrawn--transudative.   Pneumonia 02/19/2017    PAST SURGICAL HISTORY: Past Surgical History:  Procedure Laterality Date   COLONOSCOPY WITH PROPOFOL N/A 01/21/2017   Procedure: COLONOSCOPY WITH PROPOFOL;  Surgeon: Wyline Mood, MD;  Location[facility] ENDOSCOPY;  Service: Gastroenterology;  Laterality: N/A;   ESOPHAGOGASTRODUODENOSCOPY (EGD) WITH PROPOFOL N/A 01/21/2017   Procedure: ESOPHAGOGASTRODUODENOSCOPY (EGD) WITH PROPOFOL;  Surgeon: Wyline Mood, MD;  Location: [facility] ENDOSCOPY;  Service: Gastroenterology;  Laterality: N/A;   FLEXIBLE SIGMOIDOSCOPY N/A 11/04/2016   Procedure: FLEXIBLE SIGMOIDOSCOPY;  Surgeon: Charlott Rakes, MD;  Location: [facility] ENDOSCOPY;  Service: Endoscopy;  Laterality: N/A;   HIP PINNING,CANNULATED Left 06/19/2021   Procedure: CANNULATED HIP PINNING;  Surgeon: Ross Marcus, MD;  Location: [facility] ORS;  Service: Orthopedics;  Laterality: Left;   LEFT HEART CATH AND CORONARY ANGIOGRAPHY N/A 04/05/2017   Procedure: LEFT HEART  CATH AND CORONARY ANGIOGRAPHY;  Surgeon: Iran Ouch, MD;  Location: [facility] INVASIVE CV LAB;  Service: Cardiovascular;  Laterality: N/A;   LITHOTRIPSY      FAMILY HISTORY: Family History  Problem Relation Age of  Onset   Lung cancer Mother    Hypertension Father    CAD Father        a. MI age 60   Heart disease Father    COPD Neg Hx    Diabetes Mellitus II Neg Hx     ADVANCED DIRECTIVES (Y/N):  @ADVDIR @  HEALTH MAINTENANCE: Social History   Tobacco Use   Smoking status: Never   Smokeless tobacco: Never  Vaping Use   Vaping status: Never Used  Substance Use Topics   Alcohol use: No   Drug use: No     Colonoscopy:  PAP:  Bone density:  Lipid panel:  No Known Allergies  Current Facility-Administered Medications  Medication Dose Route Frequency Provider Last Rate Last Admin   acetaminophen (TYLENOL) tablet 650 mg  650 mg Oral Q6H PRN Mansy, Jan A, MD   650 mg at 04/08/23 0555   Or   acetaminophen (TYLENOL) suppository 650 mg  650 mg Rectal Q6H PRN Mansy, Jan A, MD       albuterol (PROVENTIL) (2.5 MG/3ML) 0.083% nebulizer solution 2.5 mg  2.5 mg Nebulization Q4H PRN Belue, Lendon Collar, RPH       heparin ADULT infusion 100 units/mL (25000 units/271mL)  1,000 Units/hr Intravenous Continuous Hunt, Madison H, RPH 10 mL/hr at 04/08/23 1011 1,000 Units/hr at 04/08/23 1011   hydrALAZINE (APRESOLINE) tablet 25 mg  25 mg Oral TID Mansy, Jan A, MD   25 mg at 04/08/23 1007   insulin aspart (novoLOG) injection 0-9 Units  0-9 Units Subcutaneous TID WC Marrion Coy, MD   1 Units at 04/08/23 1007   magnesium hydroxide (MILK OF MAGNESIA) suspension 30 mL  30 mL Oral Daily PRN Mansy, Jan A, MD       metoprolol succinate (TOPROL-XL) 24 hr tablet 25 mg  25 mg Oral Daily Mansy, Jan A, MD   25 mg at 04/08/23 1007   multivitamin with minerals tablet 1 tablet  1 tablet Oral Daily Mansy, Jan A, MD   1 tablet at 04/08/23 1007   ondansetron (ZOFRAN) tablet 4 mg  4 mg Oral Q6H PRN Mansy, Jan A, MD       Or   ondansetron [facility]) injection 4 mg  4 mg Intravenous Q6H PRN Mansy, Jan A, MD       rosuvastatin (CRESTOR) tablet 20 mg  20 mg Oral Daily Mansy, Jan A, MD   20 mg at 04/08/23 1007   sacubitril-valsartan  (ENTRESTO) 97-103 mg per tablet  1 tablet Oral BID Sharman Cheek, MD   1 tablet at 04/08/23 1007   traZODone (DESYREL) tablet 25 mg  25 mg Oral QHS PRN Mansy, Jan A, MD        OBJECTIVE: Vitals:   04/08/23 0733 04/08/23 0735  BP: (!) 139/93 (!) 139/93  Pulse: 76 77  Resp: 16 16  Temp: 98.4 F (36.9 C) 98.4 F (36.9 C)  SpO2: 100% 100%     Body mass index is 24.53 kg/m.    ECOG FS:1 - Symptomatic but completely ambulatory  General: Well-developed, well-nourished, no acute distress. Eyes: Pink conjunctiva, anicteric sclera. HEENT: Normocephalic, moist mucous membranes. Lungs: No audible wheezing or coughing. Heart: Regular rate and rhythm. Abdomen: Soft, nontender, no obvious distention. Musculoskeletal: No edema, cyanosis,  or clubbing. Neuro: Alert, answering all questions appropriately. Cranial nerves grossly intact. Skin: No rashes or petechiae noted. Psych: Normal affect. Lymphatics: No cervical, calvicular, axillary or inguinal LAD.   LAB RESULTS:  Lab Results  Component Value Date   NA 136 04/08/2023   K 4.1 04/08/2023   CL 105 04/08/2023   CO2 22 04/08/2023   GLUCOSE 140 (H) 04/08/2023   BUN 25 (H) 04/08/2023   CREATININE 1.36 (H) 04/08/2023   CALCIUM 7.9 (L) 04/08/2023   PROT 6.6 04/06/2023   ALBUMIN 3.6 04/06/2023   AST 22 04/06/2023   ALT 36 04/06/2023   ALKPHOS 102 04/06/2023   BILITOT 0.5 04/06/2023   GFRNONAA 45 (L) 04/08/2023   GFRAA 77 03/28/2020    Lab Results  Component Value Date   WBC 10.3 04/08/2023   NEUTROABS 7.3 04/25/2022   HGB 12.9 04/08/2023   HCT 39.0 04/08/2023   MCV 83.5 04/08/2023   PLT 345 04/08/2023     STUDIES: ECHOCARDIOGRAM COMPLETE BUBBLE STUDY  Result Date: 04/07/2023    ECHOCARDIOGRAM REPORT   Patient Name:   Erin Hayes Date of Exam: 04/07/2023 Medical Rec #:  [id number]      Height:       59.0 in Accession #:    [id number]     Weight:       120.0 lb Date of Birth:  July 01, 1965      BSA:          1.484 m  Patient Age:    57 years       BP:           125/89 mmHg Patient Gender: F              HR:           85 bpm. Exam Location:  [facility] Procedure: 2D Echo and Saline Contrast Bubble Study Indications:     Stroke 434.91/I63.9  History:         Patient has prior history of Echocardiogram examinations.  Sonographer:     Elwin Sleight RDCS Referring Phys:  [id number] Vernetta Honey MANSY Diagnosing Phys: Chilton Si MD  Sonographer Comments: Image acquisition challenging due to respiratory motion. IMPRESSIONS  1. Left ventricular ejection fraction, by estimation, is 20 to 25%. The left ventricle has severely decreased function. The left ventricle demonstrates regional wall motion abnormalities (see scoring diagram/findings for description). There is mild concentric left ventricular hypertrophy. Left ventricular diastolic parameters are consistent with Grade I diastolic dysfunction (impaired relaxation).  2. Right ventricular systolic function is normal. The right ventricular size is normal.  3. A small pericardial effusion is present.  4. The mitral valve is normal in structure. No evidence of mitral valve regurgitation. No evidence of mitral stenosis.  5. The aortic valve is tricuspid. Aortic valve regurgitation is not visualized. No aortic stenosis is present.  6. The inferior vena cava is normal in size with greater than 50% respiratory variability, suggesting right atrial pressure of 3 mmHg.  7. Agitated saline contrast bubble study was negative, with no evidence of any interatrial shunt. FINDINGS  Left Ventricle: Left ventricular ejection fraction, by estimation, is 20 to 25%. The left ventricle has severely decreased function. The left ventricle demonstrates regional wall motion abnormalities. The left ventricular internal cavity size was normal  in size. There is mild concentric left ventricular hypertrophy. Left ventricular diastolic parameters are consistent with Grade I diastolic dysfunction (impaired relaxation).  LV Wall  Scoring: The inferior wall, mid anteroseptal  segment, mid inferoseptal segment, and basal inferoseptal segment are akinetic. The mid and distal anterior wall, mid and distal lateral wall, entire apex, posterior wall, and basal anteroseptal segment are hypokinetic. The antero-lateral wall and basal anterior segment are normal. Right Ventricle: The right ventricular size is normal. No increase in right ventricular wall thickness. Right ventricular systolic function is normal. Left Atrium: Left atrial size was normal in size. Right Atrium: Right atrial size was normal in size. Pericardium: A small pericardial effusion is present. Mitral Valve: The mitral valve is normal in structure. No evidence of mitral valve regurgitation. No evidence of mitral valve stenosis. Tricuspid Valve: The tricuspid valve is normal in structure. Tricuspid valve regurgitation is not demonstrated. No evidence of tricuspid stenosis. Aortic Valve: The aortic valve is tricuspid. Aortic valve regurgitation is not visualized. No aortic stenosis is present. Aortic valve peak gradient measures 8.3 mmHg. Pulmonic Valve: The pulmonic valve was normal in structure. Pulmonic valve regurgitation is not visualized. No evidence of pulmonic stenosis. Aorta: The aortic root is normal in size and structure. Venous: The inferior vena cava is normal in size with greater than 50% respiratory variability, suggesting right atrial pressure of 3 mmHg. IAS/Shunts: No atrial level shunt detected by color flow Doppler. Agitated saline contrast was given intravenously to evaluate for intracardiac shunting. Agitated saline contrast bubble study was negative, with no evidence of any interatrial shunt.  LEFT VENTRICLE PLAX 2D LVIDd:         3.45 cm LV PW:         1.15 cm LV IVS:        1.05 cm LVOT diam:     1.70 cm LV SV:         38 LV SV Index:   26 LVOT Area:     2.27 cm  LV Volumes (MOD) LV vol d, MOD A2C: 83.3 ml LV vol d, MOD A4C: 92.1 ml LV vol s, MOD A2C: 47.9 ml  LV vol s, MOD A4C: 50.1 ml LV SV MOD A2C:     35.4 ml LV SV MOD A4C:     92.1 ml LV SV MOD BP:      39.2 ml RIGHT VENTRICLE RV Basal diam:  2.80 cm RV S prime:     9.03 cm/s TAPSE (M-mode): 2.0 cm LEFT ATRIUM             Index        RIGHT ATRIUM           Index LA Vol (A2C):   28.3 ml 19.06 ml/m  RA Area:     12.30 cm LA Vol (A4C):   29.8 ml 20.08 ml/m  RA Volume:   29.10 ml  19.60 ml/m LA Biplane Vol: 29.7 ml 20.01 ml/m  AORTIC VALVE                 PULMONIC VALVE AV Area (Vmax): 1.66 cm     PV Vmax:        1.11 m/s AV Vmax:        144.00 cm/s  PV Peak grad:   4.9 mmHg AV Peak Grad:   8.3 mmHg     RVOT Peak grad: 2 mmHg LVOT Vmax:      105.00 cm/s LVOT Vmean:     65.900 cm/s LVOT VTI:       0.167 m  AORTA Ao Root diam: 3.00 cm Ao Asc diam:  2.70 cm  SHUNTS Systemic VTI:  0.17 m Systemic Diam:  1.70 cm Chilton Si MD Electronically signed by Chilton Si MD Signature Date/Time: 04/07/2023/4:25:40 PM    Final    US Venous Img Lower Bilateral (DVT)  Result Date: 04/07/2023 CLINICAL DATA:  Bilateral lower extremity swelling. EXAM: BILATERAL LOWER EXTREMITY VENOUS DOPPLER ULTRASOUND TECHNIQUE: Gray-scale sonography with graded compression, as well as color Doppler and duplex ultrasound were performed to evaluate the lower extremity deep venous systems from the level of the common femoral vein and including the common femoral, femoral, profunda femoral, popliteal and calf veins including the posterior tibial, peroneal and gastrocnemius veins when visible. The superficial great saphenous vein was also interrogated. Spectral Doppler was utilized to evaluate flow at rest and with distal augmentation maneuvers in the common femoral, femoral and popliteal veins. COMPARISON:  None Available. FINDINGS: RIGHT LOWER EXTREMITY Common Femoral Vein: No evidence of thrombus. Normal compressibility, respiratory phasicity and response to augmentation. Saphenofemoral Junction: No evidence of thrombus. Normal  compressibility and flow on color Doppler imaging. Profunda Femoral Vein: No evidence of thrombus. Normal compressibility and flow on color Doppler imaging. Femoral Vein: No evidence of thrombus. Normal compressibility, respiratory phasicity and response to augmentation. Popliteal Vein: No evidence of thrombus. Normal compressibility, respiratory phasicity and response to augmentation. Calf Veins: No evidence of thrombus. Normal compressibility and flow on color Doppler imaging. Superficial Great Saphenous Vein: No evidence of thrombus. Normal compressibility. Venous Reflux:  None. Other Findings:  None. LEFT LOWER EXTREMITY Common Femoral Vein: No evidence of thrombus. Normal compressibility, respiratory phasicity and response to augmentation. Saphenofemoral Junction: No evidence of thrombus. Normal compressibility and flow on color Doppler imaging. Profunda Femoral Vein: No evidence of thrombus. Normal compressibility and flow on color Doppler imaging. Femoral Vein: Evidence of occlusive thrombus with abnormal compressibility, respiratory phasicity and response to augmentation. Popliteal Vein: No evidence of thrombus. Normal compressibility, respiratory phasicity and response to augmentation. Calf Veins: Evidence of occlusive thrombus within the LEFT posterior tibial vein and LEFT peroneal vein with abnormal compressibility and flow on color Doppler imaging. Superficial Great Saphenous Vein: No evidence of thrombus. Normal compressibility. Venous Reflux:  None. Other Findings:  None. IMPRESSION: 1. Evidence of occlusive DVT within the LEFT femoral vein, LEFT posterior tibial vein and LEFT peroneal vein. 2. No evidence of DVT within the RIGHT lower extremity. Electronically Signed   By: Aram Candela M.D.   On: 04/07/2023 04:02   CT Angio Chest PE W/Cm &/Or Wo Cm  Result Date: 04/07/2023 CLINICAL DATA:  Altered mental status. EXAM: CT ANGIOGRAPHY CHEST WITH CONTRAST TECHNIQUE: Multidetector CT imaging of  the chest was performed using the standard protocol during bolus administration of intravenous contrast. Multiplanar CT image reconstructions and MIPs were obtained to evaluate the vascular anatomy. RADIATION DOSE REDUCTION: This exam was performed according to the departmental dose-optimization program which includes automated exposure control, adjustment of the mA and/or kV according to patient size and/or use of iterative reconstruction technique. CONTRAST:  75mL OMNIPAQUE IOHEXOL 350 MG/ML SOLN COMPARISON:  April 04, 2017 FINDINGS: Cardiovascular: There is mild calcification of the proximal portion of the descending thoracic aorta, without evidence of aortic aneurysm. Satisfactory opacification of the pulmonary arteries to the segmental level. Marked severity intraluminal low attenuation is seen within the distal aspect of the right pulmonary artery with extension to involve multiple upper lobe branches. There is left ventricular hypertrophy without evidence of right heart strain. Thick curvilinear areas of low attenuation are seen within the lumen of the left ventricle. A moderate to large pericardial effusion is seen (approximately  9.9 mm in AP thickness). Mediastinum/Nodes: No enlarged mediastinal, hilar, or axillary lymph nodes. Thyroid gland, trachea, and esophagus demonstrate no significant findings. Lungs/Pleura: A mild atelectatic changes are seen within the anterior aspect of the left lower lobe and posteromedial aspect of the right lung base. No pleural effusion or pneumothorax is identified. Upper Abdomen: Multiple surgical clips are seen within the gallbladder fossa. Musculoskeletal: A chronic fracture deformity is seen involving the body of the sternum. No acute osseous abnormalities are identified. Review of the MIP images confirms the above findings. IMPRESSION: 1. Marked severity right-sided pulmonary embolism with extension to involve multiple upper lobe branches. 2. Moderate to large  pericardial effusion. 3. Thick curvilinear areas of low attenuation within the lumen of the left ventricle, which may represent areas of thrombus. Further evaluation with an echocardiogram and cardiology consultation is recommended. 4. Mild left lower lobe and right basilar atelectasis. 5. Evidence of prior cholecystectomy. 6. Aortic atherosclerosis. Aortic Atherosclerosis (ICD10-I70.0). Electronically Signed   By: Aram Candela M.D.   On: 04/07/2023 02:07   CT ANGIO HEAD NECK W WO CM  Result Date: 04/07/2023 CLINICAL DATA:  Follow-up examination for stroke. EXAM: CT ANGIOGRAPHY HEAD AND NECK WITH AND WITHOUT CONTRAST TECHNIQUE: Multidetector CT imaging of the head and neck was performed using the standard protocol during bolus administration of intravenous contrast. Multiplanar CT image reconstructions and MIPs were obtained to evaluate the vascular anatomy. Carotid stenosis measurements (when applicable) are obtained utilizing NASCET criteria, using the distal internal carotid diameter as the denominator. RADIATION DOSE REDUCTION: This exam was performed according to the departmental dose-optimization program which includes automated exposure control, adjustment of the mA and/or kV according to patient size and/or use of iterative reconstruction technique. CONTRAST:  75mL OMNIPAQUE IOHEXOL 350 MG/ML SOLN COMPARISON:  MRI from earlier the same day. FINDINGS: CT HEAD FINDINGS Brain: Cerebral volume within normal limits. Moderate chronic microvascular ischemic disease. Remote right parietal infarct noted. Few small remote lacunar infarcts about the thalami. Chronic right cerebellar infarct. Previously identified acute left MCA distribution infarct not visualized by CT. No acute intracranial hemorrhage. No other acute large vessel territory infarct. No mass lesion or midline shift. No hydrocephalus or extra-axial fluid collection. Vascular: No abnormal hyperdense vessel. Skull: Scalp soft tissues within normal  limits.  Calvarium intact. Sinuses/Orbits: Globes orbital soft tissues within normal limits. Other: None. Review of the MIP images confirms the above findings CTA NECK FINDINGS Aortic arch: Visualized aortic arch within normal limits for caliber with standard 3 vessel morphology. No stenosis about the origin the great vessels. Right carotid system: Right common and internal carotid arteries are tortuous but patent without stenosis or dissection. Left carotid system: Left common and internal carotid arteries are tortuous but patent without stenosis or dissection. Vertebral arteries: Both vertebral arteries arise from the subclavian arteries. No proximal subclavian artery stenosis. Vertebral arteries patent without stenosis or dissection. Skeleton: No discrete or worrisome osseous lesions. Other neck: No other acute finding. 1.5 cm nodule present at the thyroid isthmus. Upper chest: Scattered filling defects are seen within the right main and partially visualized segmental pulmonary arteries, consistent with acute pulmonary embolism (series 8, image 157). Main pulmonary artery within normal limits for caliber at 2.7 cm. Review of the MIP images confirms the above findings CTA HEAD FINDINGS Anterior circulation: Both internal carotid arteries are patent to the termini without stenosis. A1 segments patent bilaterally. Right A1 hypoplastic. Normal anterior communicating artery complex. Anterior cerebral arteries patent without stenosis. No M1 stenosis or  occlusion. No proximal MCA branch occlusion or high-grade stenosis. Right MCA branches well perfused. On the left, there is and distal left M4 occlusion (the series 10, image 78)., corresponding with the previously identified small left MCA distribution infarct. Posterior circulation: Left vertebral artery patent without stenosis. Right vertebral artery is widely patent to the takeoff of the right PICA, but occluded distally. Right PICA patent. Left PICA origin not well  seen. Basilar markedly diminutive and irregular but patent to its distal aspect. Superior cerebral arteries patent bilaterally. Fetal type origin of the PCAs bilaterally both of which are widely patent to their distal aspects. Venous sinuses: Patent allowing for timing the contrast bolus. Anatomic variants: As above.  No aneurysm. Review of the MIP images confirms the above findings IMPRESSION: CT HEAD: 1. No acute intracranial abnormality. 2. Previously identified acute left MCA distribution infarct not well visualized by CT. No acute intracranial hemorrhage. 3. Underlying moderate chronic microvascular ischemic disease with multiple remote infarcts as above. CTA HEAD AND NECK: 1. Acute distal left M4 occlusion, corresponding with the previously identified small left MCA distribution infarct. 2. Fetal type origin of the PCAs with overall diminutive vertebrobasilar system. 3. Diffuse tortuosity of the major arterial vasculature of the head and neck, suggesting chronic underlying hypertension. 4. Acute pulmonary embolism within the right main and partially visualized segmental pulmonary arteries. Further evaluation with dedicated CTA of the chest recommended for complete evaluation. 5. 1.5 cm nodule at the thyroid isthmus. Further evaluation with dedicated thyroid ultrasound recommended. This could be performed in mild emergent outpatient basis. (Ref: J Am Coll Radiol. 2015 Feb;12(2): 143-50). Critical Value/emergent results were called by telephone at the time of interpretation on 04/07/2023 at 1:16 am to provider Dr. Dolores Frame, who verbally acknowledged these results. Electronically Signed   By: Rise Mu M.D.   On: 04/07/2023 01:25   MR BRAIN WO CONTRAST  Result Date: 04/06/2023 CLINICAL DATA:  Provided history: Mental status change, unknown cause. Speech difficulty. EXAM: MRI HEAD WITHOUT CONTRAST TECHNIQUE: Multiplanar, multiecho pulse sequences of the brain and surrounding structures were obtained  without intravenous contrast. COMPARISON:  Head CT 06/18/2021. FINDINGS: Brain: No age advanced or lobar predominant parenchymal atrophy. Small acute cortical infarct within the left insula and adjacent left parietal lobe. Petechial hemorrhage also present at this site. 11 mm chronic white matter infarct within the right parietal lobe. Adjacent small chronic cortical infarct within the right parietal lobe (with a small amount of chronic hemosiderin deposition at this site). Moderate multifocal T2 FLAIR hyperintense signal abnormality elsewhere within the cerebral white matter, nonspecific but compatible chronic small vessel ischemic disease. Chronic lacunar infarcts within the bilateral deep gray nuclei. Mild chronic small vessel ischemic changes within the pons. Small chronic infarcts within the cerebellar vermis and bilateral cerebellar hemispheres. Chronic microhemorrhages within the supratentorial and infratentorial brain (with a deep gray nuclei, brainstem and cerebellar predominance). No evidence of an intracranial mass. No extra-axial fluid collection. No midline shift. Vascular: Maintained flow voids within the proximal large arterial vessels. Skull and upper cervical spine: No focal worrisome marrow lesion. Sinuses/Orbits: No mass or acute finding within the imaged orbits. No significant paranasal sinus disease. Impression #1 called by telephone at the time of interpretation on 04/06/2023 at 6:30 pm to provider PHILLIP STAFFORD , who verbally acknowledged these results. IMPRESSION: 1. Small acute cortical infarct within the left insula and adjacent left parietal lobe. Petechial hemorrhage also present at this site. 2. Background chronic small vessel ischemic disease and chronic infarcts as  outlined. 3. Chronic microhemorrhages within the supratentorial and infratentorial brain, in a distribution suggesting sequelae of chronic hypertensive microangiopathy. Electronically Signed   By: Jackey Loge D.O.   On:  04/06/2023 18:31    ASSESSMENT: Pulmonary embolism.  PLAN:    Pulmonary embolism: Patient has no documented history of a prior pulmonary embolism.  She does have a history of DVT in July 2023.  Patient is a poor historian and initially reported she was on Eliquis, but now is stating she has not taken any blood thinners in a long time.  This does not appear to be an Eliquis failure, but patient has now had her second blood clot therefore will initiate full hypercoagulable workup.  Recommend transitioning to Xarelto, since this is a once a day medication and her compliance is unclear.  No further interventions are needed.  Will arrange follow-up in the cancer center in January 2025.  Okay to discharge from hematology standpoint.  Appreciate consult, will follow.   Jeralyn Ruths, MD   04/08/2023 1:02 PM

## 2023-04-08 NOTE — Progress Notes (Addendum)
Progress Note   Patient: Erin Hayes ZOX:[id number] DOB: [birth year] DOA: 04/06/2023     1 DOS: the patient was seen and examined on 04/08/2023   Brief hospital course: ARENA MAKKI is a 57 y.o. female with medical history significant for chronic combined systolic and diastolic CHF, GERD, hypertension, dyslipidemia and nonischemic cardiomyopathy, who presented to the emergency room with acute onset of altered mental status with confusion.  Patient had a history of PE/DVT a year ago, patient also had a left lower extremity fracture 4 months ago.  MRI of the brain showed small acute cortical infarct within the left insular, CT angiogram of the neck and head showed acute distal left M4 occlusion. CT angiogram of chest showed severe right sided pulmonary embolism with extension to multiple upper lobes branches.  Duplex ultrasound showed extensive left lower extremity DVT. Patient has been seen by teleneurology, started on heparin drip.   Principal Problem:   Acute CVA (cerebrovascular accident) (HCC) Active Problems:   Acute pulmonary embolism (HCC)   Hypertensive urgency   Chronic combined systolic and diastolic CHF (congestive heart failure) (HCC)   Hypokalemia   DM (diabetes mellitus), type 2 (HCC)   AKI (acute kidney injury) (HCC)   Dyslipidemia   Acute ischemic stroke (HCC)   History of recurrent deep vein thrombosis (DVT)   Assessment and Plan: * Acute CVA (cerebrovascular accident) (HCC) involving left MCA distribution. Left ICA M4 branch occlusion. Given concurrent pulmonary emboli, suspicion raised for thromboembolic stroke.  Echocardiogram has been ordered, bubble study will be performed to rule out PFO. Patient has been seen by teleneurology, I spoke with Dr. Otelia Limes, he will follow the patient. Heparin drip was started, will continue. Patient states that she was taking Eliquis, potentially failed Eliquis treatment.  Oncology/hematology consult obtained.     Acute  pulmonary embolism (HCC) Left lower extremity occlusive DVT. Patient had a history of DVT a year ago, this is the second episode.  Patient also has left lower extremity ankle fracture, which she will predispose patient to have DVT. However, since this is second episode, with the severity of DVT/PE, the patient will need lifetime anticoagulation. Continue heparin drip. Patient was also evaluated by vascular surgery, pending decision if interventions needed.   Acute kidney injury. Patient renal function worse today, hold off diuretics.  Recheck a BMP tomorrow.  Hypertensive urgency As needed blood pressure medicine ordered.   Hypokalemia Potassium has normalized.   Chronic combined systolic and diastolic CHF (congestive heart failure) (HCC) - We will continue Entresto, Lasix, Toprol-XL and Aldactone. Repeated echocardiogram showed ejection fraction now at 20 to 25%.  Consult from cardiology is obtained.     Dyslipidemia - We will continue statin therapy.   DM (diabetes mellitus), type 2 (HCC) - The patient will be placed on supplemental coverage with NovoLog. Discontinue oral diabetic medicine.          Subjective:  Patient doing well today, no significant short of breath.  No chest pain.  Physical Exam: Vitals:   04/08/23 0419 04/08/23 0517 04/08/23 0733 04/08/23 0735  BP: 96/79 (!) 141/93 (!) 139/93 (!) 139/93  Pulse: 83 77 76 77  Resp: 17 15 16 16   Temp: 98.3 F (36.8 C)  98.4 F (36.9 C) 98.4 F (36.9 C)  TempSrc:      SpO2: 99%  100% 100%  Weight:      Height:       General exam: Appears calm and comfortable  Respiratory system: Clear to auscultation.  Respiratory effort normal. Cardiovascular system: S1 & S2 heard, RRR. No JVD, murmurs, rubs, gallops or clicks. No pedal edema. Gastrointestinal system: Abdomen is nondistended, soft and nontender. No organomegaly or masses felt. Normal bowel sounds heard. Central nervous system: Alert and oriented. No focal  neurological deficits. Extremities: Symmetric 5 x 5 power. Skin: No rashes, lesions or ulcers Psychiatry: Judgement and insight appear normal. Mood & affect appropriate.    Data Reviewed:  Results reviewed.  Family Communication: None  Disposition: Status is: Inpatient Remains inpatient appropriate because: Severity of disease, IV treatment.     Time spent: 35 minutes  Author: Marrion Coy, MD 04/08/2023 12:16 PM  For on call review [email or web address].

## 2023-04-08 NOTE — Progress Notes (Signed)
Transition of Care [facility]) - Inpatient Brief Assessment   Patient Details  Name: Erin Hayes MRN: [id number] Date of Birth: 09-03-1965  Transition of Care [facility]) CM/SW Contact:    Truddie Hidden, RN Phone Number: 04/08/2023, 10:20 AM   Clinical Narrative: TOC continuing to follow patient's progress throughout discharge planning.    Transition of Care Asessment: Insurance and Status: Insurance coverage has been reviewed Patient has primary care physician: Yes Home environment has been reviewed: Home Prior level of function:: Independent Prior/Current Home Services: No current home services Social Determinants of Health Reivew: SDOH reviewed no interventions necessary Readmission risk has been reviewed: Yes Transition of care needs: transition of care needs identified, TOC will continue to follow

## 2023-04-08 NOTE — Consult Note (Signed)
Cardiology Consult    Patient ID: ABBIEGAIL Hayes MRN: [id number], DOB/AGE: 59-Jan-1967   Admit date: 04/06/2023 Date of Consult: 04/08/2023  Primary Physician: Lorre Munroe, NP Primary Cardiologist: Julien Nordmann, MD Requesting Provider: Marrion Coy MD  Patient Profile    Erin Hayes is a 57 y.o. female with a h/o chronic combined syst/diast CHF, NICM, HTN, HL, DMII, and DVT, who is being seen today for the evaluation of Reduced EF 20 to 25% 12/01 at the request of Dr.Zhang.  Past Medical History  Subjective  Past Medical History:  Diagnosis Date   Carpal tunnel syndrome of right wrist    Chronic combined systolic (congestive) and diastolic (congestive) heart failure (HCC)    a. 03/2017 Echo: EF 20-25%, diff HK, Gr2 DD; b. 09/2017 Echo: EF 20-25%, diff HK, Gr2 DD; c. 04/2020 Echo: EF 45-50%, mild LVH, antsept/basal ant HK, nl RV fxn; d. 11/2022 Echo: EF 25%, basal, mid & apical and antsept AK/DK.; e. 04/2023 Echo: EF 20-25%, mild conc LVH, GrI DD, nl RV fxn, small pericardial effusion.   Diabetes (HCC)    GERD (gastroesophageal reflux disease)    HLD (hyperlipidemia)    Hypertension    Lower leg DVT (deep venous thrombosis) (HCC)    NICM (nonischemic cardiomyopathy) (HCC)    a. 03/2017 Echo: EF 20-25%; b. 03/2017 Cath: mild nonobs dzs, EF 25%; c. 09/2017 Echo: EF 20-25%; d. 04/2020 Echo: EF 45-50%; e. 11/2022 Echo: EF 25%; f. 11/2022 MV: small, mild, fixed antsept/infsept defects, likely 2/2 LBBB->low risk; g. 04/2023 Echo: EF 20-25%.   Pleural effusion, left    a. 03/2017 s/p thoracentesis-->300 ml withdrawn--transudative.   Pneumonia 02/19/2017   Pulmonary embolism (HCC)    Stroke [facility])     Past Surgical History:  Procedure Laterality Date   COLONOSCOPY WITH PROPOFOL N/A 01/21/2017   Procedure: COLONOSCOPY WITH PROPOFOL;  Surgeon: Wyline Mood, MD;  Location: [facility] ENDOSCOPY;  Service: Gastroenterology;  Laterality: N/A;   ESOPHAGOGASTRODUODENOSCOPY (EGD) WITH PROPOFOL N/A  01/21/2017   Procedure: ESOPHAGOGASTRODUODENOSCOPY (EGD) WITH PROPOFOL;  Surgeon: Wyline Mood, MD;  Location: [facility] ENDOSCOPY;  Service: Gastroenterology;  Laterality: N/A;   FLEXIBLE SIGMOIDOSCOPY N/A 11/04/2016   Procedure: FLEXIBLE SIGMOIDOSCOPY;  Surgeon: Charlott Rakes, MD;  Location: [facility] ENDOSCOPY;  Service: Endoscopy;  Laterality: N/A;   HIP PINNING,CANNULATED Left 06/19/2021   Procedure: CANNULATED HIP PINNING;  Surgeon: Ross Marcus, MD;  Location: [facility] ORS;  Service: Orthopedics;  Laterality: Left;   LEFT HEART CATH AND CORONARY ANGIOGRAPHY N/A 04/05/2017   Procedure: LEFT HEART CATH AND CORONARY ANGIOGRAPHY;  Surgeon: Iran Ouch, MD;  Location: [facility] INVASIVE CV LAB;  Service: Cardiovascular;  Laterality: N/A;   LITHOTRIPSY       Allergies  No Known Allergies    History of Present Illness     Erin Hayes is a 57 year old female patient with medical history that includes chronic combined systolic and diastolic congestive heart failure (CHF), gastroesophageal reflux disease (GERD), hypertension, dyslipidemia, DVT, and nonischemic cardiomyopathy.  Cardiomyopathy was initially diagnosed in October 2018 in the setting of admission for pneumonia.  EF at that time was 20 to 25%.  Diagnostic catheterization showed minimal, nonobstructive CAD, and she was manage medically.  Follow-up echo in May 2019 showed persistent LV dysfunction with an EF of 20 to 25% however, December 2021 echo, showed improvement to 45-50%.  Following left femoral neck fracture in February 2023, patient developed nonocclusive DVT in May 2023 and subsequently occlusive DVT in July 2023.  She was treated with Eliquis which was subsequently discontinued following completion of course.  In July 2024 she was admitted to [facility] with recurrent DVT and weakness.  She was found to have a partially occlusive thrombus extending into the bilateral common femoral veins, iliac vein, and infrahepatic IVC.  CTA of the chest was  negative for PE at that time.  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  Stress testing was undertaken and was low risk, showing a fixed defect most consistent with left bundle branch block.    Erin Hayes has been on Eliquis since July 2024 and reports compliance.  She was last in cardiology clinic in November 2024, at which time she was doing well.  She notes that she was in her usual state of health on November 30, when her family noted that she was having alteration of mental status.  She was taken to the emergency department where she was afebrile and hypertensive at 180/110.  ECG showed sinus rhythm at 94 with a left bundle branch block.  Lab work was notable for hypokalemia (3.2), and otherwise unremarkable. An MRI of the brain on 11/30 revealed a small acute cortical infarct in the left insula and adjacent left parietal lobe, along with a petechial hemorrhage at the same location. CTA of the head and neck, showed acute distal left M4 occlusion with incidental finding of acute PE in the right main pulmonary embolism and partially visualized segmental pulmonary arteries.  This was followed by CT of the chest, which confirmed right-sided PE.  She was seen by vascular surgery and was not felt to require any interventional treatment.  Recommendation was made for heparin infusion in the setting of Eliquis failure.  A follow-up echo has been performed and again shows reduced EF of 20-25% with grade 1 diastolic dysfunction.  We have been asked to evaluate secondary to cardiomyopathy.  Patient denies any recent history of chest pain, dyspnea, palpitations, PND, orthopnea, edema, or early satiety.  Ambulation is somewhat limited and she uses a wheelchair for longer distances.  She sometimes uses a walker at home.  Inpatient Medications  Subjective    hydrALAZINE  25 mg Oral TID   insulin aspart  0-9 Units  Subcutaneous TID WC   metoprolol succinate  25 mg Oral Daily   multivitamin with minerals  1 tablet Oral Daily   rosuvastatin  20 mg Oral Daily   sacubitril-valsartan  1 tablet Oral BID    Family History    Family History  Problem Relation Age of Onset   Lung cancer Mother    Hypertension Father    CAD Father        a. MI age 33   Heart disease Father    COPD Neg Hx    Diabetes Mellitus II Neg Hx    She indicated that her mother is deceased. She indicated that her father is deceased. She indicated that the status of her neg hx is unknown.   Social History    Social History   Socioeconomic History   Marital status: Single    Spouse name: Not on file   Number of children: 3   Years of education: 12   Highest education level: 12th grade  Occupational History   Occupation: home maker  Tobacco Use   Smoking status: Never   Smokeless tobacco: Never  Vaping Use   Vaping status:  Never Used  Substance and Sexual Activity   Alcohol use: No   Drug use: No   Sexual activity: Never  Other Topics Concern   Not on file  Social History Narrative   Not on file   Social Determinants of Health   Financial Resource Strain: Low Risk  (04/08/2023)   Overall Financial Resource Strain (CARDIA)    Difficulty of Paying Living Expenses: Not hard at all  Food Insecurity: No Food Insecurity (12/07/2022)   Hunger Vital Sign    Worried About Running Out of Food in the Last Year: Never true    Ran Out of Food in the Last Year: Never true  Transportation Needs: No Transportation Needs (04/08/2023)   PRAPARE - Administrator, Civil Service (Medical): No    Lack of Transportation (Non-Medical): No  Physical Activity: Unknown (12/07/2022)   Exercise Vital Sign    Days of Exercise per Week: 2 days    Minutes of Exercise per Session: Not on file  Stress: No Stress Concern Present (12/07/2022)   [employer] of Occupational Health - Occupational Stress Questionnaire    Feeling of  Stress : Not at all  Social Connections: Moderately Isolated (12/07/2022)   Social Connection and Isolation Panel [NHANES]    Frequency of Communication with Friends and Family: More than three times a week    Frequency of Social Gatherings with Friends and Family: More than three times a week    Attends Religious Services: More than 4 times per year    Active Member of [employer] or Organizations: No    Attends Banker Meetings: Never    Marital Status: Divorced  Catering manager Violence: Not At Risk (12/07/2022)   Humiliation, Afraid, Rape, and Kick questionnaire    Fear of Current or Ex-Partner: No    Emotionally Abused: No    Physically Abused: No    Sexually Abused: No     Review of Systems    General:  No chills, fever, night sweats or weight changes.  Cardiovascular:  No chest pain, dyspnea on exertion, edema, orthopnea, palpitations, paroxysmal nocturnal dyspnea. Dermatological: No rash, lesions/masses Respiratory: No cough, dyspnea Urologic: No hematuria, dysuria Abdominal:   No nausea, vomiting, diarrhea, bright red blood per rectum, melena, or hematemesis Neurologic:  +++ AMS on presentation.  No visual changes, changes in mental status. All other systems reviewed and are otherwise negative except as noted above.    Objective  Physical Exam    Blood pressure (!) 139/93, pulse 77, temperature 98.4 F (36.9 C), resp. rate 16, height 4\' 11"  (1.499 m), weight 55.1 kg, SpO2 100%.  General: Pleasant, NAD Psych: Normal affect. Neuro: Alert and oriented X 3. Moves all extremities spontaneously. HEENT: Normal  Neck: Supple without bruits or JVD. Lungs:  Resp regular and unlabored. Heart: RRR no s3, + s4.  2/6 SEM @ the RUSB. Abdomen: Soft, non-tender, non-distended, BS + x 4.  Extremities: No clubbing, cyanosis or edema. DP/PT2+, Radials 2+ and equal bilaterally.  Labs      BNP    Component Value Date/Time   BNP 653.8 (H) 04/25/2022 1608   Lab Results   Component Value Date   WBC 10.3 04/08/2023   HGB 12.9 04/08/2023   HCT 39.0 04/08/2023   MCV 83.5 04/08/2023   PLT 345 04/08/2023    Recent Labs  Lab 04/06/23 1445 04/07/23 0447 04/08/23 0458  NA 137   < > 136  K 3.2*   < > 4.1  CL 105   < > 105  CO2 22   < > 22  BUN 20   < > 25*  CREATININE 1.10*   < > 1.36*  CALCIUM 7.9*   < > 7.9*  PROT 6.6  --   --   BILITOT 0.5  --   --   ALKPHOS 102  --   --   ALT 36  --   --   AST 22  --   --   GLUCOSE 191*   < > 140*   < > = values in this interval not displayed.   Lab Results  Component Value Date   CHOL 217 (H) 04/07/2023   HDL 65 04/07/2023   LDLCALC 131 (H) 04/07/2023   TRIG 106 04/07/2023     Radiology Studies    US Venous Img Lower Bilateral (DVT)  Result Date: 04/07/2023 CLINICAL DATA:  Bilateral lower extremity swelling. EXAM: BILATERAL LOWER EXTREMITY VENOUS DOPPLER ULTRASOUND TECHNIQUE: Gray-scale sonography with graded compression, as well as color Doppler and duplex ultrasound were performed to evaluate the lower extremity deep venous systems from the level of the common femoral vein and including the common femoral, femoral, profunda femoral, popliteal and calf veins including the posterior tibial, peroneal and gastrocnemius veins when visible. The superficial great saphenous vein was also interrogated. Spectral Doppler was utilized to evaluate flow at rest and with distal augmentation maneuvers in the common femoral, femoral and popliteal veins. COMPARISON:  None Available. FINDINGS: RIGHT LOWER EXTREMITY Common Femoral Vein: No evidence of thrombus. Normal compressibility, respiratory phasicity and response to augmentation. Saphenofemoral Junction: No evidence of thrombus. Normal compressibility and flow on color Doppler imaging. Profunda Femoral Vein: No evidence of thrombus. Normal compressibility and flow on color Doppler imaging. Femoral Vein: No evidence of thrombus. Normal compressibility, respiratory phasicity  and response to augmentation. Popliteal Vein: No evidence of thrombus. Normal compressibility, respiratory phasicity and response to augmentation. Calf Veins: No evidence of thrombus. Normal compressibility and flow on color Doppler imaging. Superficial Great Saphenous Vein: No evidence of thrombus. Normal compressibility. Venous Reflux:  None. Other Findings:  None. LEFT LOWER EXTREMITY Common Femoral Vein: No evidence of thrombus. Normal compressibility, respiratory phasicity and response to augmentation. Saphenofemoral Junction: No evidence of thrombus. Normal compressibility and flow on color Doppler imaging. Profunda Femoral Vein: No evidence of thrombus. Normal compressibility and flow on color Doppler imaging. Femoral Vein: Evidence of occlusive thrombus with abnormal compressibility, respiratory phasicity and response to augmentation. Popliteal Vein: No evidence of thrombus. Normal compressibility, respiratory phasicity and response to augmentation. Calf Veins: Evidence of occlusive thrombus within the LEFT posterior tibial vein and LEFT peroneal vein with abnormal compressibility and flow on color Doppler imaging. Superficial Great Saphenous Vein: No evidence of thrombus. Normal compressibility. Venous Reflux:  None. Other Findings:  None. IMPRESSION: 1. Evidence of occlusive DVT within the LEFT femoral vein, LEFT posterior tibial vein and LEFT peroneal vein. 2. No evidence of DVT within the RIGHT lower extremity. Electronically Signed   By: Aram Candela M.D.   On: 04/07/2023 04:02   CT Angio Chest PE W/Cm &/Or Wo Cm  Result Date: 04/07/2023 CLINICAL DATA:  Altered mental status. EXAM: CT ANGIOGRAPHY CHEST WITH CONTRAST TECHNIQUE: Multidetector CT imaging of the chest was performed using the standard protocol during bolus administration of intravenous contrast. Multiplanar CT image reconstructions and MIPs were obtained to evaluate the vascular anatomy. RADIATION DOSE REDUCTION: This exam was  performed according to the departmental dose-optimization program which includes automated  exposure control, adjustment of the mA and/or kV according to patient size and/or use of iterative reconstruction technique. CONTRAST:  75mL OMNIPAQUE IOHEXOL 350 MG/ML SOLN COMPARISON:  April 04, 2017 FINDINGS: Cardiovascular: There is mild calcification of the proximal portion of the descending thoracic aorta, without evidence of aortic aneurysm. Satisfactory opacification of the pulmonary arteries to the segmental level. Marked severity intraluminal low attenuation is seen within the distal aspect of the right pulmonary artery with extension to involve multiple upper lobe branches. There is left ventricular hypertrophy without evidence of right heart strain. Thick curvilinear areas of low attenuation are seen within the lumen of the left ventricle. A moderate to large pericardial effusion is seen (approximately 9.9 mm in AP thickness). Mediastinum/Nodes: No enlarged mediastinal, hilar, or axillary lymph nodes. Thyroid gland, trachea, and esophagus demonstrate no significant findings. Lungs/Pleura: A mild atelectatic changes are seen within the anterior aspect of the left lower lobe and posteromedial aspect of the right lung base. No pleural effusion or pneumothorax is identified. Upper Abdomen: Multiple surgical clips are seen within the gallbladder fossa. Musculoskeletal: A chronic fracture deformity is seen involving the body of the sternum. No acute osseous abnormalities are identified. Review of the MIP images confirms the above findings. IMPRESSION: 1. Marked severity right-sided pulmonary embolism with extension to involve multiple upper lobe branches. 2. Moderate to large pericardial effusion. 3. Thick curvilinear areas of low attenuation within the lumen of the left ventricle, which may represent areas of thrombus. Further evaluation with an echocardiogram and cardiology consultation is recommended. 4. Mild left  lower lobe and right basilar atelectasis. 5. Evidence of prior cholecystectomy. 6. Aortic atherosclerosis. Aortic Atherosclerosis (ICD10-I70.0). Electronically Signed   By: Aram Candela M.D.   On: 04/07/2023 02:07   CT ANGIO HEAD NECK W WO CM  Result Date: 04/07/2023 CLINICAL DATA:  Follow-up examination for stroke. EXAM: CT ANGIOGRAPHY HEAD AND NECK WITH AND WITHOUT CONTRAST TECHNIQUE: Multidetector CT imaging of the head and neck was performed using the standard protocol during bolus administration of intravenous contrast. Multiplanar CT image reconstructions and MIPs were obtained to evaluate the vascular anatomy. Carotid stenosis measurements (when applicable) are obtained utilizing NASCET criteria, using the distal internal carotid diameter as the denominator. RADIATION DOSE REDUCTION: This exam was performed according to the departmental dose-optimization program which includes automated exposure control, adjustment of the mA and/or kV according to patient size and/or use of iterative reconstruction technique. CONTRAST:  75mL OMNIPAQUE IOHEXOL 350 MG/ML SOLN COMPARISON:  MRI from earlier the same day. FINDINGS: CT HEAD FINDINGS Brain: Cerebral volume within normal limits. Moderate chronic microvascular ischemic disease. Remote right parietal infarct noted. Few small remote lacunar infarcts about the thalami. Chronic right cerebellar infarct. Previously identified acute left MCA distribution infarct not visualized by CT. No acute intracranial hemorrhage. No other acute large vessel territory infarct. No mass lesion or midline shift. No hydrocephalus or extra-axial fluid collection. Vascular: No abnormal hyperdense vessel. Skull: Scalp soft tissues within normal limits.  Calvarium intact. Sinuses/Orbits: Globes orbital soft tissues within normal limits. Other: None. Review of the MIP images confirms the above findings CTA NECK FINDINGS Aortic arch: Visualized aortic arch within normal limits for caliber  with standard 3 vessel morphology. No stenosis about the origin the great vessels. Right carotid system: Right common and internal carotid arteries are tortuous but patent without stenosis or dissection. Left carotid system: Left common and internal carotid arteries are tortuous but patent without stenosis or dissection. Vertebral arteries: Both vertebral arteries arise from  the subclavian arteries. No proximal subclavian artery stenosis. Vertebral arteries patent without stenosis or dissection. Skeleton: No discrete or worrisome osseous lesions. Other neck: No other acute finding. 1.5 cm nodule present at the thyroid isthmus. Upper chest: Scattered filling defects are seen within the right main and partially visualized segmental pulmonary arteries, consistent with acute pulmonary embolism (series 8, image 157). Main pulmonary artery within normal limits for caliber at 2.7 cm. Review of the MIP images confirms the above findings CTA HEAD FINDINGS Anterior circulation: Both internal carotid arteries are patent to the termini without stenosis. A1 segments patent bilaterally. Right A1 hypoplastic. Normal anterior communicating artery complex. Anterior cerebral arteries patent without stenosis. No M1 stenosis or occlusion. No proximal MCA branch occlusion or high-grade stenosis. Right MCA branches well perfused. On the left, there is and distal left M4 occlusion (the series 10, image 78)., corresponding with the previously identified small left MCA distribution infarct. Posterior circulation: Left vertebral artery patent without stenosis. Right vertebral artery is widely patent to the takeoff of the right PICA, but occluded distally. Right PICA patent. Left PICA origin not well seen. Basilar markedly diminutive and irregular but patent to its distal aspect. Superior cerebral arteries patent bilaterally. Fetal type origin of the PCAs bilaterally both of which are widely patent to their distal aspects. Venous sinuses:  Patent allowing for timing the contrast bolus. Anatomic variants: As above.  No aneurysm. Review of the MIP images confirms the above findings IMPRESSION: CT HEAD: 1. No acute intracranial abnormality. 2. Previously identified acute left MCA distribution infarct not well visualized by CT. No acute intracranial hemorrhage. 3. Underlying moderate chronic microvascular ischemic disease with multiple remote infarcts as above. CTA HEAD AND NECK: 1. Acute distal left M4 occlusion, corresponding with the previously identified small left MCA distribution infarct. 2. Fetal type origin of the PCAs with overall diminutive vertebrobasilar system. 3. Diffuse tortuosity of the major arterial vasculature of the head and neck, suggesting chronic underlying hypertension. 4. Acute pulmonary embolism within the right main and partially visualized segmental pulmonary arteries. Further evaluation with dedicated CTA of the chest recommended for complete evaluation. 5. 1.5 cm nodule at the thyroid isthmus. Further evaluation with dedicated thyroid ultrasound recommended. This could be performed in mild emergent outpatient basis. (Ref: J Am Coll Radiol. 2015 Feb;12(2): 143-50). Critical Value/emergent results were called by telephone at the time of interpretation on 04/07/2023 at 1:16 am to provider Dr. Dolores Frame, who verbally acknowledged these results. Electronically Signed   By: Rise Mu M.D.   On: 04/07/2023 01:25   MR BRAIN WO CONTRAST  Result Date: 04/06/2023 CLINICAL DATA:  Provided history: Mental status change, unknown cause. Speech difficulty. EXAM: MRI HEAD WITHOUT CONTRAST TECHNIQUE: Multiplanar, multiecho pulse sequences of the brain and surrounding structures were obtained without intravenous contrast. COMPARISON:  Head CT 06/18/2021. FINDINGS: Brain: No age advanced or lobar predominant parenchymal atrophy. Small acute cortical infarct within the left insula and adjacent left parietal lobe. Petechial hemorrhage  also present at this site. 11 mm chronic white matter infarct within the right parietal lobe. Adjacent small chronic cortical infarct within the right parietal lobe (with a small amount of chronic hemosiderin deposition at this site). Moderate multifocal T2 FLAIR hyperintense signal abnormality elsewhere within the cerebral white matter, nonspecific but compatible chronic small vessel ischemic disease. Chronic lacunar infarcts within the bilateral deep gray nuclei. Mild chronic small vessel ischemic changes within the pons. Small chronic infarcts within the cerebellar vermis and bilateral cerebellar hemispheres. Chronic microhemorrhages within  the supratentorial and infratentorial brain (with a deep gray nuclei, brainstem and cerebellar predominance). No evidence of an intracranial mass. No extra-axial fluid collection. No midline shift. Vascular: Maintained flow voids within the proximal large arterial vessels. Skull and upper cervical spine: No focal worrisome marrow lesion. Sinuses/Orbits: No mass or acute finding within the imaged orbits. No significant paranasal sinus disease. Impression #1 called by telephone at the time of interpretation on 04/06/2023 at 6:30 pm to provider PHILLIP STAFFORD , who verbally acknowledged these results. IMPRESSION: 1. Small acute cortical infarct within the left insula and adjacent left parietal lobe. Petechial hemorrhage also present at this site. 2. Background chronic small vessel ischemic disease and chronic infarcts as outlined. 3. Chronic microhemorrhages within the supratentorial and infratentorial brain, in a distribution suggesting sequelae of chronic hypertensive microangiopathy. Electronically Signed   By: Jackey Loge D.O.   On: 04/06/2023 18:31      ECG & Cardiac Imaging    Regular sinus rhythm, 94, left branch block- personally reviewed.  Assessment & Plan    1.   Acute stroke: Patient presented on November 30 with altered mental status and was found to have  an acute cortical infarct within the left insula and adjacent left parietal lobe.  Still with some word finding but overall, strength seems to be normal.  Management per medicine and neurology.  2.  Left lower extremity DVT and PE: Patient with prior history of DVT, most recently diagnosed in July 2024.  She has been on Eliquis.  CT at that time was negative for PE.  On presentation, CT of the head and neck incidentally showed right-sided PE.  Follow-up CTA confirmed.  She reports compliance with Eliquis.  In the setting, this represents Eliquis failure.  She has been seen by vascular surgery and is not felt to require intervention at this time.  She is currently on heparin with plan to transition to Xarelto.  3.  Chronic heart failure with reduced ejection fraction/nonischemic cardiomyopathy: This dates back to October 2018.  She did have a period of recovery in 2021 however, during admission July 2024 at [facility], [place] was again noted to be in the 20 to 25% range.  She has been well-managed in the outpatient setting and has not been experiencing heart failure symptoms.  She is euvolemic on examination today.  Continue beta-blocker, hydralazine, and Entresto.  She was on spironolactone at home, which can be resumed if okay with medicine/neurology (pressures downtrending in the 130s).  Consider SGLT2 inhibitor.  4.  Primary hypertension:  Patient BP is mildly elevated 139/93 mmHg, continue current regimen with plan to resume spironolactone.  5. Hyperlipidemia:  The patient's most recent lipid panel (12/1) indicates elevated LDL (131 mg/dL) and total cholesterol (217 mg/dL).  She is prescribed rosuvastatin 20 mg daily in the outpatient setting and I note that she previously LDL as low as 48.  Current LDL of 131, suggest that she likely has not been taking statin at home and certainly brings into question compliance with other medications as well.  6.  Type 2 diabetes mellitus: Per medicine team.   Risk  Assessment/Risk Scores:        New York Heart Association (NYHA) Functional Class NYHA Class II    Signed, Nicolasa Ducking, NP 04/08/2023, 4:22 PM  For questions or updates, please contact   Please consult [email or web address] for contact info under Cardiology/STEMI.

## 2023-04-08 NOTE — Progress Notes (Addendum)
ANTICOAGULATION CONSULT NOTE  Pharmacy Consult for heparin infusion Indication: pulmonary embolus  No Known Allergies  Patient Measurements: Height: 4\' 11"  (149.9 cm) Weight: 55.1 kg (121 lb 7.6 oz) IBW/kg (Calculated) : 43.2 Heparin Dosing Weight: 54.4 kg  Vital Signs: Temp: 98.4 F (36.9 C) (12/02 0735) BP: 139/93 (12/02 0735) Pulse Rate: 77 (12/02 0735)  Labs: Recent Labs    04/06/23 1445 04/07/23 0447 04/07/23 1447 04/07/23 2154 04/08/23 0458 04/08/23 0603 04/08/23 1259  HGB 13.0 12.3  --   --   --  12.9  --   HCT 39.6 38.0  --   --   --  39.0  --   PLT 231 331  --   --   --  345  --   APTT  --  26  --   --   --   --   --   LABPROT  --  14.2  --   --   --   --   --   INR  --  1.1  --   --   --   --   --   HEPARINUNFRC  --  <0.10*   < > 0.26*  --  0.48 0.51  CREATININE 1.10* 1.07*  --   --  1.36*  --   --    < > = values in this interval not displayed.    Estimated Creatinine Clearance: 33.8 mL/min (A) (by C-G formula based on SCr of 1.36 mg/dL (H)).   Medical History: Past Medical History:  Diagnosis Date   Carpal tunnel syndrome of right wrist    Chronic combined systolic (congestive) and diastolic (congestive) heart failure (HCC)    a. 03/2017 Echo: EF 20-25%, diff HK, Gr2 DD; b. 09/2017 Echo: EF 20-25%, diff HK, Gr2 DD.   Diabetes (HCC)    GERD (gastroesophageal reflux disease)    HLD (hyperlipidemia)    Hypertension    NICM (nonischemic cardiomyopathy) (HCC)    a. 03/2017 Echo: EF 20-25%, diff HK, Gr2 DD, mild to mod MR, nl RV fxn;  b. 03/2017 Cath: mild nonobs dzs, EF 25%; c. 09/2017 Echo: EF 20-25%, diff HK. Gr2 DD. Mild MR. Mildly reduced RV fxn.   Pleural effusion, left    a. 03/2017 s/p thoracentesis-->300 ml withdrawn--transudative.   Pneumonia 02/19/2017    Medications:  PTA Meds: Eliquis 5 mg BID, last dose unknown  Assessment: Pt is a 57 yo female with h/o DVT on Eliquis, presenting to ED due to confusion that started abruptly at 12:30 PM  today, lasting about an hour, found with "marked severity right-sided pulmonary embolism with extension to involve multiple upper lobe branches." Previously had a DVT in 11/2021. Unsure of compliance of apixaban. No recent dispense hx for it. MRI reveals a small acute cortical infarct within the left insula and adjacent left parietal lobe.   Per neurology note:  heparin low intensity no bolus protocol  Goal of Therapy:  Heparin level 0.3-0.5 units/ml Monitor platelets by anticoagulation protocol: Yes   12/01@1447 : HL 0.12, subtherapeutic@750  units/hr 12/01@ 2154: HL 0.26, subtherapeutic@950  units/hr 12/02 0458 HL 0.48, therapeutic x 1 12/02 1259 HL 0.51.   Plan:  No bolus due to CVA. Heparin level is borderline above goal. Decrease heparin infusion to 950 units/hr. Recheck heparin level in 6 hours. Check CBC with AM labs.   Paschal Dopp, PharmD, BCPS 04/08/2023 1:54 PM

## 2023-04-08 NOTE — Progress Notes (Signed)
ANTICOAGULATION CONSULT NOTE  Pharmacy Consult for heparin infusion Indication: pulmonary embolus  No Known Allergies  Patient Measurements: Height: 4\' 11"  (149.9 cm) Weight: 55.1 kg (121 lb 7.6 oz) IBW/kg (Calculated) : 43.2 Heparin Dosing Weight: 54.4 kg  Vital Signs: Temp: 98.3 F (36.8 C) (12/02 0419) Temp Source: Oral (12/01 2101) BP: 141/93 (12/02 0517) Pulse Rate: 77 (12/02 0517)  Labs: Recent Labs    04/06/23 1445 04/06/23 1445 04/07/23 0447 04/07/23 1447 04/07/23 2154 04/08/23 0458 04/08/23 0603  HGB 13.0  --  12.3  --   --   --  12.9  HCT 39.6  --  38.0  --   --   --  39.0  PLT 231  --  331  --   --   --  345  APTT  --   --  26  --   --   --   --   LABPROT  --   --  14.2  --   --   --   --   INR  --   --  1.1  --   --   --   --   HEPARINUNFRC  --    < > <0.10* 0.12* 0.26*  --  0.48  CREATININE 1.10*  --  1.07*  --   --  1.36*  --    < > = values in this interval not displayed.    Estimated Creatinine Clearance: 33.8 mL/min (A) (by C-G formula based on SCr of 1.36 mg/dL (H)).   Medical History: Past Medical History:  Diagnosis Date   Carpal tunnel syndrome of right wrist    Chronic combined systolic (congestive) and diastolic (congestive) heart failure (HCC)    a. 03/2017 Echo: EF 20-25%, diff HK, Gr2 DD; b. 09/2017 Echo: EF 20-25%, diff HK, Gr2 DD.   Diabetes (HCC)    GERD (gastroesophageal reflux disease)    HLD (hyperlipidemia)    Hypertension    NICM (nonischemic cardiomyopathy) (HCC)    a. 03/2017 Echo: EF 20-25%, diff HK, Gr2 DD, mild to mod MR, nl RV fxn;  b. 03/2017 Cath: mild nonobs dzs, EF 25%; c. 09/2017 Echo: EF 20-25%, diff HK. Gr2 DD. Mild MR. Mildly reduced RV fxn.   Pleural effusion, left    a. 03/2017 s/p thoracentesis-->300 ml withdrawn--transudative.   Pneumonia 02/19/2017    Medications:  PTA Meds: Eliquis 5 mg BID, last dose unknown  Assessment: Pt is a 57 yo female with h/o DVT on Eliquis, presenting to ED due to confusion  that started abruptly at 12:30 PM today, lasting about an hour, found with "marked severity right-sided pulmonary embolism with extension to involve multiple upper lobe branches."  Per neurology note:  heparin low intensity no bolus protocol  Goal of Therapy:  Heparin level 0.3-0.5 units/ml aPTT 66-85 seconds Monitor platelets by anticoagulation protocol: Yes   12/01@1447 : HL 0.12, subtherapeutic@750  units/hr 12/01@ 2154: HL 0.26, subtherapeutic@950  units/hr 12/02 0458 HL 0.48, therapeutic x 1  Plan:  No boluses, per neurology Continue heparin infusion rate at 1000 units/hr Will check HL in 6 hr to confirm then daily. HL & CBC daily while on heparin  Otelia Sergeant, PharmD, [facility] 04/08/2023 6:37 AM

## 2023-04-08 NOTE — Telephone Encounter (Addendum)
Patient Product/process development scientist completed.    The patient is insured through [facility]. Patient has Medicare and is not eligible for a copay card, but may be able to apply for patient assistance, if available.    Ran test claim for Xarelto 20 mg and the current 30 day co-pay is $0.00.  Ran test claim for enoxaparin (Lovenox) 60  mg/0.6 ml and the current 30 day co-pay is $0.00.  Ran test claim for Eliquis 5 mg and the current 30 day co-pay is $0.00.  Ran test claim for Farxiga 10 mg and the current 30 day co-pay is $0.00.  Ran test claim for Jardiance 10 mg and the current 30 day co-pay is $0.00.  Ran test claim for isosorbide-hydralazine 20-37.5 mg and the current 30 day co-pay is $0.00.  This test claim was processed through [facility]- copay amounts may vary at other pharmacies due to pharmacy/plan contracts, or as the patient moves through the different stages of their insurance plan.     Roland Earl, CPHT Pharmacy Technician III Certified Patient Advocate [facility] Patient Advocate Team Direct Number: [phone]  Fax: [phone]

## 2023-04-08 NOTE — Plan of Care (Signed)
  Problem: Education: Goal: Knowledge of disease or condition will improve Outcome: Progressing   

## 2023-04-08 NOTE — Plan of Care (Signed)
  Problem: Education: Goal: Knowledge of disease or condition will improve Outcome: Progressing Goal: Knowledge of secondary prevention will improve (MUST DOCUMENT ALL) Outcome: Progressing Goal: Knowledge of patient specific risk factors will improve Loraine Leriche N/A or DELETE if not current risk factor) Outcome: Progressing   Problem: Ischemic Stroke/TIA Tissue Perfusion: Goal: Complications of ischemic stroke/TIA will be minimized Outcome: Progressing   Problem: Coping: Goal: Will verbalize positive feelings about self Outcome: Progressing Goal: Will identify appropriate support needs Outcome: Progressing   Problem: Health Behavior/Discharge Planning: Goal: Ability to manage health-related needs will improve Outcome: Progressing Goal: Goals will be collaboratively established with patient/family Outcome: Progressing   Problem: Self-Care: Goal: Ability to participate in self-care as condition permits will improve Outcome: Progressing Goal: Verbalization of feelings and concerns over difficulty with self-care will improve Outcome: Progressing Goal: Ability to communicate needs accurately will improve Outcome: Progressing   Problem: Nutrition: Goal: Risk of aspiration will decrease Outcome: Progressing Goal: Dietary intake will improve Outcome: Progressing   Problem: Education: Goal: Ability to describe self-care measures that may prevent or decrease complications (Diabetes Survival Skills Education) will improve Outcome: Progressing Goal: Individualized Educational Video(s) Outcome: Progressing   Problem: Coping: Goal: Ability to adjust to condition or change in health will improve Outcome: Progressing   Problem: Fluid Volume: Goal: Ability to maintain a balanced intake and output will improve Outcome: Progressing   Problem: Health Behavior/Discharge Planning: Goal: Ability to identify and utilize available resources and services will improve Outcome:  Progressing Goal: Ability to manage health-related needs will improve Outcome: Progressing   Problem: Metabolic: Goal: Ability to maintain appropriate glucose levels will improve Outcome: Progressing   Problem: Nutritional: Goal: Maintenance of adequate nutrition will improve Outcome: Progressing Goal: Progress toward achieving an optimal weight will improve Outcome: Progressing   Problem: Skin Integrity: Goal: Risk for impaired skin integrity will decrease Outcome: Progressing   Problem: Tissue Perfusion: Goal: Adequacy of tissue perfusion will improve Outcome: Progressing   Problem: Education: Goal: Knowledge of General Education information will improve Description: Including pain rating scale, medication(s)/side effects and non-pharmacologic comfort measures Outcome: Progressing   Problem: Health Behavior/Discharge Planning: Goal: Ability to manage health-related needs will improve Outcome: Progressing   Problem: Clinical Measurements: Goal: Ability to maintain clinical measurements within normal limits will improve Outcome: Progressing Goal: Will remain free from infection Outcome: Progressing Goal: Diagnostic test results will improve Outcome: Progressing Goal: Respiratory complications will improve Outcome: Progressing Goal: Cardiovascular complication will be avoided Outcome: Progressing   Problem: Activity: Goal: Risk for activity intolerance will decrease Outcome: Progressing   Problem: Nutrition: Goal: Adequate nutrition will be maintained Outcome: Progressing   Problem: Coping: Goal: Level of anxiety will decrease Outcome: Progressing   Problem: Elimination: Goal: Will not experience complications related to bowel motility Outcome: Progressing Goal: Will not experience complications related to urinary retention Outcome: Progressing   Problem: Pain Management: Goal: General experience of comfort will improve Outcome: Progressing   Problem:  Safety: Goal: Ability to remain free from injury will improve Outcome: Progressing   Problem: Skin Integrity: Goal: Risk for impaired skin integrity will decrease Outcome: Progressing

## 2023-04-09 ENCOUNTER — Other Ambulatory Visit: Payer: Self-pay

## 2023-04-09 DIAGNOSIS — I2699 Other pulmonary embolism without acute cor pulmonale: Secondary | ICD-10-CM | POA: Diagnosis not present

## 2023-04-09 DIAGNOSIS — I5042 Chronic combined systolic (congestive) and diastolic (congestive) heart failure: Secondary | ICD-10-CM | POA: Diagnosis not present

## 2023-04-09 DIAGNOSIS — I6389 Other cerebral infarction: Secondary | ICD-10-CM | POA: Diagnosis not present

## 2023-04-09 DIAGNOSIS — I639 Cerebral infarction, unspecified: Secondary | ICD-10-CM | POA: Diagnosis not present

## 2023-04-09 DIAGNOSIS — I5022 Chronic systolic (congestive) heart failure: Secondary | ICD-10-CM | POA: Diagnosis not present

## 2023-04-09 LAB — CBC
HCT: 34.8 % — ABNORMAL LOW (ref 36.0–46.0)
Hemoglobin: 11.3 g/dL — ABNORMAL LOW (ref 12.0–15.0)
MCH: 27.7 pg (ref 26.0–34.0)
MCHC: 32.5 g/dL (ref 30.0–36.0)
MCV: 85.3 fL (ref 80.0–100.0)
Platelets: 303 10*3/uL (ref 150–400)
RBC: 4.08 MIL/uL (ref 3.87–5.11)
RDW: 13.9 % (ref 11.5–15.5)
WBC: 9.2 10*3/uL (ref 4.0–10.5)
nRBC: 0 % (ref 0.0–0.2)

## 2023-04-09 LAB — BASIC METABOLIC PANEL
Anion gap: 9 (ref 5–15)
BUN: 29 mg/dL — ABNORMAL HIGH (ref 6–20)
CO2: 21 mmol/L — ABNORMAL LOW (ref 22–32)
Calcium: 7.6 mg/dL — ABNORMAL LOW (ref 8.9–10.3)
Chloride: 106 mmol/L (ref 98–111)
Creatinine, Ser: 1.27 mg/dL — ABNORMAL HIGH (ref 0.44–1.00)
GFR, Estimated: 49 mL/min — ABNORMAL LOW (ref >60)
Glucose, Bld: 203 mg/dL — ABNORMAL HIGH (ref 70–99)
Potassium: 4 mmol/L (ref 3.5–5.1)
Sodium: 136 mmol/L (ref 135–145)

## 2023-04-09 LAB — HEPARIN LEVEL (UNFRACTIONATED): Heparin Unfractionated: 0.32 [IU]/mL (ref 0.30–0.70)

## 2023-04-09 LAB — GLUCOSE, CAPILLARY
Glucose-Capillary: 127 mg/dL — ABNORMAL HIGH (ref 70–99)
Glucose-Capillary: 143 mg/dL — ABNORMAL HIGH (ref 70–99)

## 2023-04-09 LAB — ANTITHROMBIN III: AntiThromb III Func: 33 % — ABNORMAL LOW (ref 75–120)

## 2023-04-09 MED ORDER — RIVAROXABAN 15 MG PO TABS
15.0000 mg | ORAL_TABLET | Freq: Two times a day (BID) | ORAL | Status: DC
  Administered 2023-04-09: 15 mg via ORAL
  Filled 2023-04-09 (×2): qty 1

## 2023-04-09 MED ORDER — RIVAROXABAN (XARELTO) VTE STARTER PACK (15 & 20 MG)
ORAL_TABLET | ORAL | 0 refills | Status: DC
  Filled 2023-04-09: qty 51, 28d supply, fill #0

## 2023-04-09 MED ORDER — RIVAROXABAN 20 MG PO TABS: 20.0000 mg | ORAL_TABLET | Freq: Every day | ORAL | Status: DC

## 2023-04-09 NOTE — Progress Notes (Addendum)
Patient is not able to walk the distance required to go the bathroom, or he/she is unable to safely negotiate stairs required to access the bathroom.  A BSC will alleviate this problem.

## 2023-04-09 NOTE — Progress Notes (Signed)
ANTICOAGULATION CONSULT NOTE  Pharmacy Consult for heparin infusion Indication: pulmonary embolus  No Known Allergies  Patient Measurements: Height: 4\' 11"  (149.9 cm) Weight: 55.1 kg (121 lb 7.6 oz) IBW/kg (Calculated) : 43.2 Heparin Dosing Weight: 54.4 kg  Vital Signs: Temp: 98.7 F (37.1 C) (12/03 0319) BP: 128/83 (12/03 0319) Pulse Rate: 79 (12/03 0319)  Labs: Recent Labs    04/07/23 0447 04/07/23 1447 04/08/23 0458 04/08/23 0603 04/08/23 1259 04/09/23 0301  HGB 12.3  --   --  12.9  --  11.3*  HCT 38.0  --   --  39.0  --  34.8*  PLT 331  --   --  345  --  303  APTT 26  --   --   --   --   --   LABPROT 14.2  --   --   --   --   --   INR 1.1  --   --   --   --   --   HEPARINUNFRC <0.10*   < >  --  0.48 0.51 0.32  CREATININE 1.07*  --  1.36*  --   --  1.27*   < > = values in this interval not displayed.    Estimated Creatinine Clearance: 36.1 mL/min (A) (by C-G formula based on SCr of 1.27 mg/dL (H)).   Medical History: Past Medical History:  Diagnosis Date   Carpal tunnel syndrome of right wrist    Chronic combined systolic (congestive) and diastolic (congestive) heart failure (HCC)    a. 03/2017 Echo: EF 20-25%, diff HK, Gr2 DD; b. 09/2017 Echo: EF 20-25%, diff HK, Gr2 DD; c. 04/2020 Echo: EF 45-50%, mild LVH, antsept/basal ant HK, nl RV fxn; d. 11/2022 Echo: EF 25%, basal, mid & apical and antsept AK/DK.; e. 04/2023 Echo: EF 20-25%, mild conc LVH, GrI DD, nl RV fxn, small pericardial effusion.   Diabetes (HCC)    GERD (gastroesophageal reflux disease)    HLD (hyperlipidemia)    Hypertension    Lower leg DVT (deep venous thrombosis) (HCC)    NICM (nonischemic cardiomyopathy) (HCC)    a. 03/2017 Echo: EF 20-25%; b. 03/2017 Cath: mild nonobs dzs, EF 25%; c. 09/2017 Echo: EF 20-25%; d. 04/2020 Echo: EF 45-50%; e. 11/2022 Echo: EF 25%; f. 11/2022 MV: small, mild, fixed antsept/infsept defects, likely 2/2 LBBB->low risk; g. 04/2023 Echo: EF 20-25%.   Pleural effusion,  left    a. 03/2017 s/p thoracentesis-->300 ml withdrawn--transudative.   Pneumonia 02/19/2017   Pulmonary embolism (HCC)    Stroke (HCC)     Medications:  PTA Meds: Eliquis 5 mg BID, last dose unknown  Assessment: Pt is a 57 yo female with h/o DVT on Eliquis, presenting to ED due to confusion that started abruptly at 12:30 PM today, lasting about an hour, found with "marked severity right-sided pulmonary embolism with extension to involve multiple upper lobe branches." Previously had a DVT in 11/2021. Unsure of compliance of apixaban. No recent dispense hx for it. MRI reveals a small acute cortical infarct within the left insula and adjacent left parietal lobe.   Per neurology note:  heparin low intensity no bolus protocol  Goal of Therapy:  Heparin level 0.3-0.5 units/ml Monitor platelets by anticoagulation protocol: Yes   12/01@1447 : HL 0.12, subtherapeutic@750  units/hr 12/01@ 2154: HL 0.26, subtherapeutic@950  units/hr 12/02 0458 HL 0.48, therapeutic x 1 12/02 1259 HL 0.51.  12/03 0301 HL 0.32, therapeutic X 1   Plan:  No bolus due to CVA.  12/03:  HL @ 0301 = 0.32, therapeutic X 1 - Will continue pt on current rate and recheck HL in 6 hrs  Jeanine Caven D 04/09/2023 3:33 AM

## 2023-04-09 NOTE — Discharge Summary (Signed)
Physician Discharge Summary   Patient: Erin Hayes MRN: [id number] DOB: Jun 21, 1965  Admit date:     04/06/2023  Discharge date: 04/09/23  Discharge Physician: Marrion Coy   PCP: Lorre Munroe, NP   Recommendations at discharge:   Follow-up with PCP in 1 week. Follow-up with cardiology in 2 weeks. Follow-up with neurology in 4 weeks.  Discharge Diagnoses: Principal Problem:   Acute CVA (cerebrovascular accident) (HCC) Active Problems:   Acute pulmonary embolism (HCC)   Hypertensive urgency   Chronic combined systolic and diastolic CHF (congestive heart failure) (HCC)   Hypokalemia   DM (diabetes mellitus), type 2 (HCC)   AKI (acute kidney injury) (HCC)   Dyslipidemia   Acute ischemic stroke (HCC)   History of recurrent deep vein thrombosis (DVT)  Resolved Problems:   * No resolved hospital problems. *  Hospital Course: Erin Hayes is a 57 y.o. female with medical history significant for chronic combined systolic and diastolic CHF, GERD, hypertension, dyslipidemia and nonischemic cardiomyopathy, who presented to the emergency room with acute onset of altered mental status with confusion.  Patient had a history of PE/DVT a year ago, patient also had a left lower extremity fracture 4 months ago.  MRI of the brain showed small acute cortical infarct within the left insular, CT angiogram of the neck and head showed acute distal left M4 occlusion. CT angiogram of chest showed severe right sided pulmonary embolism with extension to multiple upper lobes branches.  Duplex ultrasound showed extensive left lower extremity DVT. Patient has been seen by teleneurology, started on heparin drip. Patient is seen by vascular surgery, no need for intervention.  She was also seen by hematology, she did not fail Eliquis treatment, she was not compliant.  At this point, treatment will be changed to Xarelto.  Assessment and Plan:  Acute CVA (cerebrovascular accident) (HCC) involving left MCA  distribution. Left ICA M4 branch occlusion. Given concurrent pulmonary emboli, suspicion raised for thromboembolic stroke.  Echocardiogram has been performed, ejection fraction 20 to 25% without a PFO. Patient is also seen by neurology, treated with aspirin, heparin, and statin. After seen by hematology, it is determined that patient was not compliant with Eliquis.  At this point, treatment switched to Xarelto. Patient condition has improved, will be followed by PT/OT and home health.     Acute pulmonary embolism (HCC) Left lower extremity occlusive DVT. Patient had a history of DVT a year ago, this is the second episode.  Patient also has left lower extremity ankle fracture, which she will predispose patient to have DVT. However, since this is second episode, with the severity of DVT/PE, the patient will need lifetime anticoagulation. With heparin, no need for intervention per vascular surgery. Treatment switched from heparin to Xarelto.  Patient be followed with PCP as outpatient.   Acute kidney injury. Hypokalemia. Minimal metabolic acidosis. Renal function has been better and stable.   Hypertensive urgency Blood pressure is better, resume home treatment.   Chronic combined systolic and diastolic CHF (congestive heart failure) (HCC) - We will continue Entresto, Lasix, Toprol-XL and Aldactone. Repeated echocardiogram showed ejection fraction now at 20 to 25%.  Seen by cardiology, no exacerbation. Continue on Entresto, beta-blocker and hydralazine, furosemide, follow-up with cardiology as outpatient.     Dyslipidemia - We will continue statin therapy.   DM (diabetes mellitus), type 2 (HCC) Resume home treatment.             Consultants: Cardiology and neurology. Procedures performed: None  Disposition:  Home health Diet recommendation:  Discharge Diet Orders (From admission, onward)     Start     Ordered   04/09/23 0000  Diet - low sodium heart healthy         04/09/23 1050           Cardiac diet DISCHARGE MEDICATION: Allergies as of 04/09/2023   No Known Allergies      Medication List     STOP taking these medications    apixaban 5 MG Tabs tablet Commonly known as: ELIQUIS   multivitamin with minerals Tabs tablet   spironolactone 25 MG tablet Commonly known as: ALDACTONE       TAKE these medications    albuterol 108 (90 Base) MCG/ACT inhaler Commonly known as: VENTOLIN HFA Inhale 1-2 puffs into the lungs every 6 (six) hours as needed for wheezing or shortness of breath.   furosemide 40 MG tablet Commonly known as: LASIX Take 1 tablet (40 mg total) by mouth daily.   glipiZIDE 5 MG 24 hr tablet Commonly known as: GLUCOTROL XL Take 1 tablet (5 mg total) by mouth daily with breakfast.   hydrALAZINE 25 MG tablet Commonly known as: APRESOLINE Take 1 tablet (25 mg total) by mouth 3 (three) times daily.   loperamide HCl 1 MG/7.5ML solution Commonly known as: IMODIUM Take 2 mg by mouth 4 (four) times daily as needed for diarrhea or loose stools.   metoprolol succinate 25 MG 24 hr tablet Commonly known as: TOPROL-XL Take 1 tablet (25 mg total) by mouth daily.   Rivaroxaban Starter Pack (15 mg and 20 mg) Commonly known as: XARELTO STARTER PACK Follow package directions: Take one 15mg  tablet by mouth twice a day. On day 22, switch to one 20mg  tablet once a day. Take with food.   rosuvastatin 20 MG tablet Commonly known as: CRESTOR Take 1 tablet (20 mg total) by mouth daily.   sacubitril-valsartan 97-103 MG Commonly known as: ENTRESTO Take 1 tablet by mouth 2 (two) times daily.               Durable Medical Equipment  (From admission, onward)           Start     Ordered   04/09/23 1017  For home use only DME Walker rolling  Once       Question Answer Comment  Walker: With 5 Inch Wheels   Patient needs a walker to treat with the following condition Pulmonary emboli (HCC)      04/09/23 1016             Follow-up Information     [facility] Follow up in 7 day(s).   Specialty: Cardiology Why: Hospital Follow-Up 12//9 /24 @ 3:30 pm Please bring all medications to follow-up appointment [facility] [address] Free Valet Parking Contact information: [address] [place] [address] [phone]        Lorre Munroe, NP Follow up in 1 week(s).   Specialties: Internal Medicine, Emergency Medicine Contact information: [address] [place] [address] [phone]                Discharge Exam: Filed Weights   04/07/23 0200 04/07/23 2101  Weight: 54.4 kg 55.1 kg   General exam: Appears calm and comfortable  Respiratory system: Clear to auscultation. Respiratory effort normal. Cardiovascular system: S1 & S2 heard, RRR. No JVD, murmurs, rubs, gallops or clicks. No  pedal edema. Gastrointestinal system: Abdomen is nondistended, soft and nontender. No organomegaly or masses felt. Normal bowel sounds heard. Central nervous system: Alert and oriented. No focal neurological deficits. Extremities: Symmetric 5 x 5 power. Skin: No rashes, lesions or ulcers Psychiatry: Judgement and insight appear normal. Mood & affect appropriate.    Condition at discharge: good  The results of significant diagnostics from this hospitalization (including imaging, microbiology, ancillary and laboratory) are listed below for reference.   Imaging Studies: ECHOCARDIOGRAM COMPLETE BUBBLE STUDY  Result Date: 04/07/2023    ECHOCARDIOGRAM REPORT   Patient Name:   Erin Hayes Date of Exam: 04/07/2023 Medical Rec #:  [id number]      Height:       59.0 in Accession #:    [id number]     Weight:       120.0 lb Date of Birth:  Nov 22, 1965      BSA:          1.484 m Patient Age:    57 years       BP:           125/89 mmHg Patient Gender: F              HR:           85 bpm. Exam Location:  [facility] Procedure:  2D Echo and Saline Contrast Bubble Study Indications:     Stroke 434.91/I63.9  History:         Patient has prior history of Echocardiogram examinations.  Sonographer:     Elwin Sleight RDCS Referring Phys:  [id number] Vernetta Honey MANSY Diagnosing Phys: Chilton Si MD  Sonographer Comments: Image acquisition challenging due to respiratory motion. IMPRESSIONS  1. Left ventricular ejection fraction, by estimation, is 20 to 25%. The left ventricle has severely decreased function. The left ventricle demonstrates regional wall motion abnormalities (see scoring diagram/findings for description). There is mild concentric left ventricular hypertrophy. Left ventricular diastolic parameters are consistent with Grade I diastolic dysfunction (impaired relaxation).  2. Right ventricular systolic function is normal. The right ventricular size is normal.  3. A small pericardial effusion is present.  4. The mitral valve is normal in structure. No evidence of mitral valve regurgitation. No evidence of mitral stenosis.  5. The aortic valve is tricuspid. Aortic valve regurgitation is not visualized. No aortic stenosis is present.  6. The inferior vena cava is normal in size with greater than 50% respiratory variability, suggesting right atrial pressure of 3 mmHg.  7. Agitated saline contrast bubble study was negative, with no evidence of any interatrial shunt. FINDINGS  Left Ventricle: Left ventricular ejection fraction, by estimation, is 20 to 25%. The left ventricle has severely decreased function. The left ventricle demonstrates regional wall motion abnormalities. The left ventricular internal cavity size was normal  in size. There is mild concentric left ventricular hypertrophy. Left ventricular diastolic parameters are consistent with Grade I diastolic dysfunction (impaired relaxation).  LV Wall Scoring: The inferior wall, mid anteroseptal segment, mid inferoseptal segment, and basal inferoseptal segment are akinetic. The mid and  distal anterior wall, mid and distal lateral wall, entire apex, posterior wall, and basal anteroseptal segment are hypokinetic. The antero-lateral wall and basal anterior segment are normal. Right Ventricle: The right ventricular size is normal. No increase in right ventricular wall thickness. Right ventricular systolic function is normal. Left Atrium: Left atrial size was normal in size. Right Atrium: Right atrial size was normal in size. Pericardium: A small pericardial effusion is present. Mitral  Valve: The mitral valve is normal in structure. No evidence of mitral valve regurgitation. No evidence of mitral valve stenosis. Tricuspid Valve: The tricuspid valve is normal in structure. Tricuspid valve regurgitation is not demonstrated. No evidence of tricuspid stenosis. Aortic Valve: The aortic valve is tricuspid. Aortic valve regurgitation is not visualized. No aortic stenosis is present. Aortic valve peak gradient measures 8.3 mmHg. Pulmonic Valve: The pulmonic valve was normal in structure. Pulmonic valve regurgitation is not visualized. No evidence of pulmonic stenosis. Aorta: The aortic root is normal in size and structure. Venous: The inferior vena cava is normal in size with greater than 50% respiratory variability, suggesting right atrial pressure of 3 mmHg. IAS/Shunts: No atrial level shunt detected by color flow Doppler. Agitated saline contrast was given intravenously to evaluate for intracardiac shunting. Agitated saline contrast bubble study was negative, with no evidence of any interatrial shunt.  LEFT VENTRICLE PLAX 2D LVIDd:         3.45 cm LV PW:         1.15 cm LV IVS:        1.05 cm LVOT diam:     1.70 cm LV SV:         38 LV SV Index:   26 LVOT Area:     2.27 cm  LV Volumes (MOD) LV vol d, MOD A2C: 83.3 ml LV vol d, MOD A4C: 92.1 ml LV vol s, MOD A2C: 47.9 ml LV vol s, MOD A4C: 50.1 ml LV SV MOD A2C:     35.4 ml LV SV MOD A4C:     92.1 ml LV SV MOD BP:      39.2 ml RIGHT VENTRICLE RV Basal diam:   2.80 cm RV S prime:     9.03 cm/s TAPSE (M-mode): 2.0 cm LEFT ATRIUM             Index        RIGHT ATRIUM           Index LA Vol (A2C):   28.3 ml 19.06 ml/m  RA Area:     12.30 cm LA Vol (A4C):   29.8 ml 20.08 ml/m  RA Volume:   29.10 ml  19.60 ml/m LA Biplane Vol: 29.7 ml 20.01 ml/m  AORTIC VALVE                 PULMONIC VALVE AV Area (Vmax): 1.66 cm     PV Vmax:        1.11 m/s AV Vmax:        144.00 cm/s  PV Peak grad:   4.9 mmHg AV Peak Grad:   8.3 mmHg     RVOT Peak grad: 2 mmHg LVOT Vmax:      105.00 cm/s LVOT Vmean:     65.900 cm/s LVOT VTI:       0.167 m  AORTA Ao Root diam: 3.00 cm Ao Asc diam:  2.70 cm  SHUNTS Systemic VTI:  0.17 m Systemic Diam: 1.70 cm Chilton Si MD Electronically signed by Chilton Si MD Signature Date/Time: 04/07/2023/4:25:40 PM    Final    US Venous Img Lower Bilateral (DVT)  Result Date: 04/07/2023 CLINICAL DATA:  Bilateral lower extremity swelling. EXAM: BILATERAL LOWER EXTREMITY VENOUS DOPPLER ULTRASOUND TECHNIQUE: Gray-scale sonography with graded compression, as well as color Doppler and duplex ultrasound were performed to evaluate the lower extremity deep venous systems from the level of the common femoral vein and including the common femoral, femoral, profunda femoral, popliteal  and calf veins including the posterior tibial, peroneal and gastrocnemius veins when visible. The superficial great saphenous vein was also interrogated. Spectral Doppler was utilized to evaluate flow at rest and with distal augmentation maneuvers in the common femoral, femoral and popliteal veins. COMPARISON:  None Available. FINDINGS: RIGHT LOWER EXTREMITY Common Femoral Vein: No evidence of thrombus. Normal compressibility, respiratory phasicity and response to augmentation. Saphenofemoral Junction: No evidence of thrombus. Normal compressibility and flow on color Doppler imaging. Profunda Femoral Vein: No evidence of thrombus. Normal compressibility and flow on color Doppler  imaging. Femoral Vein: No evidence of thrombus. Normal compressibility, respiratory phasicity and response to augmentation. Popliteal Vein: No evidence of thrombus. Normal compressibility, respiratory phasicity and response to augmentation. Calf Veins: No evidence of thrombus. Normal compressibility and flow on color Doppler imaging. Superficial Great Saphenous Vein: No evidence of thrombus. Normal compressibility. Venous Reflux:  None. Other Findings:  None. LEFT LOWER EXTREMITY Common Femoral Vein: No evidence of thrombus. Normal compressibility, respiratory phasicity and response to augmentation. Saphenofemoral Junction: No evidence of thrombus. Normal compressibility and flow on color Doppler imaging. Profunda Femoral Vein: No evidence of thrombus. Normal compressibility and flow on color Doppler imaging. Femoral Vein: Evidence of occlusive thrombus with abnormal compressibility, respiratory phasicity and response to augmentation. Popliteal Vein: No evidence of thrombus. Normal compressibility, respiratory phasicity and response to augmentation. Calf Veins: Evidence of occlusive thrombus within the LEFT posterior tibial vein and LEFT peroneal vein with abnormal compressibility and flow on color Doppler imaging. Superficial Great Saphenous Vein: No evidence of thrombus. Normal compressibility. Venous Reflux:  None. Other Findings:  None. IMPRESSION: 1. Evidence of occlusive DVT within the LEFT femoral vein, LEFT posterior tibial vein and LEFT peroneal vein. 2. No evidence of DVT within the RIGHT lower extremity. Electronically Signed   By: Aram Candela M.D.   On: 04/07/2023 04:02   CT Angio Chest PE W/Cm &/Or Wo Cm  Result Date: 04/07/2023 CLINICAL DATA:  Altered mental status. EXAM: CT ANGIOGRAPHY CHEST WITH CONTRAST TECHNIQUE: Multidetector CT imaging of the chest was performed using the standard protocol during bolus administration of intravenous contrast. Multiplanar CT image reconstructions and MIPs  were obtained to evaluate the vascular anatomy. RADIATION DOSE REDUCTION: This exam was performed according to the departmental dose-optimization program which includes automated exposure control, adjustment of the mA and/or kV according to patient size and/or use of iterative reconstruction technique. CONTRAST:  75mL OMNIPAQUE IOHEXOL 350 MG/ML SOLN COMPARISON:  April 04, 2017 FINDINGS: Cardiovascular: There is mild calcification of the proximal portion of the descending thoracic aorta, without evidence of aortic aneurysm. Satisfactory opacification of the pulmonary arteries to the segmental level. Marked severity intraluminal low attenuation is seen within the distal aspect of the right pulmonary artery with extension to involve multiple upper lobe branches. There is left ventricular hypertrophy without evidence of right heart strain. Thick curvilinear areas of low attenuation are seen within the lumen of the left ventricle. A moderate to large pericardial effusion is seen (approximately 9.9 mm in AP thickness). Mediastinum/Nodes: No enlarged mediastinal, hilar, or axillary lymph nodes. Thyroid gland, trachea, and esophagus demonstrate no significant findings. Lungs/Pleura: A mild atelectatic changes are seen within the anterior aspect of the left lower lobe and posteromedial aspect of the right lung base. No pleural effusion or pneumothorax is identified. Upper Abdomen: Multiple surgical clips are seen within the gallbladder fossa. Musculoskeletal: A chronic fracture deformity is seen involving the body of the sternum. No acute osseous abnormalities are identified. Review of the  MIP images confirms the above findings. IMPRESSION: 1. Marked severity right-sided pulmonary embolism with extension to involve multiple upper lobe branches. 2. Moderate to large pericardial effusion. 3. Thick curvilinear areas of low attenuation within the lumen of the left ventricle, which may represent areas of thrombus. Further  evaluation with an echocardiogram and cardiology consultation is recommended. 4. Mild left lower lobe and right basilar atelectasis. 5. Evidence of prior cholecystectomy. 6. Aortic atherosclerosis. Aortic Atherosclerosis (ICD10-I70.0). Electronically Signed   By: Aram Candela M.D.   On: 04/07/2023 02:07   CT ANGIO HEAD NECK W WO CM  Result Date: 04/07/2023 CLINICAL DATA:  Follow-up examination for stroke. EXAM: CT ANGIOGRAPHY HEAD AND NECK WITH AND WITHOUT CONTRAST TECHNIQUE: Multidetector CT imaging of the head and neck was performed using the standard protocol during bolus administration of intravenous contrast. Multiplanar CT image reconstructions and MIPs were obtained to evaluate the vascular anatomy. Carotid stenosis measurements (when applicable) are obtained utilizing NASCET criteria, using the distal internal carotid diameter as the denominator. RADIATION DOSE REDUCTION: This exam was performed according to the departmental dose-optimization program which includes automated exposure control, adjustment of the mA and/or kV according to patient size and/or use of iterative reconstruction technique. CONTRAST:  75mL OMNIPAQUE IOHEXOL 350 MG/ML SOLN COMPARISON:  MRI from earlier the same day. FINDINGS: CT HEAD FINDINGS Brain: Cerebral volume within normal limits. Moderate chronic microvascular ischemic disease. Remote right parietal infarct noted. Few small remote lacunar infarcts about the thalami. Chronic right cerebellar infarct. Previously identified acute left MCA distribution infarct not visualized by CT. No acute intracranial hemorrhage. No other acute large vessel territory infarct. No mass lesion or midline shift. No hydrocephalus or extra-axial fluid collection. Vascular: No abnormal hyperdense vessel. Skull: Scalp soft tissues within normal limits.  Calvarium intact. Sinuses/Orbits: Globes orbital soft tissues within normal limits. Other: None. Review of the MIP images confirms the above  findings CTA NECK FINDINGS Aortic arch: Visualized aortic arch within normal limits for caliber with standard 3 vessel morphology. No stenosis about the origin the great vessels. Right carotid system: Right common and internal carotid arteries are tortuous but patent without stenosis or dissection. Left carotid system: Left common and internal carotid arteries are tortuous but patent without stenosis or dissection. Vertebral arteries: Both vertebral arteries arise from the subclavian arteries. No proximal subclavian artery stenosis. Vertebral arteries patent without stenosis or dissection. Skeleton: No discrete or worrisome osseous lesions. Other neck: No other acute finding. 1.5 cm nodule present at the thyroid isthmus. Upper chest: Scattered filling defects are seen within the right main and partially visualized segmental pulmonary arteries, consistent with acute pulmonary embolism (series 8, image 157). Main pulmonary artery within normal limits for caliber at 2.7 cm. Review of the MIP images confirms the above findings CTA HEAD FINDINGS Anterior circulation: Both internal carotid arteries are patent to the termini without stenosis. A1 segments patent bilaterally. Right A1 hypoplastic. Normal anterior communicating artery complex. Anterior cerebral arteries patent without stenosis. No M1 stenosis or occlusion. No proximal MCA branch occlusion or high-grade stenosis. Right MCA branches well perfused. On the left, there is and distal left M4 occlusion (the series 10, image 78)., corresponding with the previously identified small left MCA distribution infarct. Posterior circulation: Left vertebral artery patent without stenosis. Right vertebral artery is widely patent to the takeoff of the right PICA, but occluded distally. Right PICA patent. Left PICA origin not well seen. Basilar markedly diminutive and irregular but patent to its distal aspect. Superior cerebral arteries patent  bilaterally. Fetal type origin of  the PCAs bilaterally both of which are widely patent to their distal aspects. Venous sinuses: Patent allowing for timing the contrast bolus. Anatomic variants: As above.  No aneurysm. Review of the MIP images confirms the above findings IMPRESSION: CT HEAD: 1. No acute intracranial abnormality. 2. Previously identified acute left MCA distribution infarct not well visualized by CT. No acute intracranial hemorrhage. 3. Underlying moderate chronic microvascular ischemic disease with multiple remote infarcts as above. CTA HEAD AND NECK: 1. Acute distal left M4 occlusion, corresponding with the previously identified small left MCA distribution infarct. 2. Fetal type origin of the PCAs with overall diminutive vertebrobasilar system. 3. Diffuse tortuosity of the major arterial vasculature of the head and neck, suggesting chronic underlying hypertension. 4. Acute pulmonary embolism within the right main and partially visualized segmental pulmonary arteries. Further evaluation with dedicated CTA of the chest recommended for complete evaluation. 5. 1.5 cm nodule at the thyroid isthmus. Further evaluation with dedicated thyroid ultrasound recommended. This could be performed in mild emergent outpatient basis. (Ref: J Am Coll Radiol. 2015 Feb;12(2): 143-50). Critical Value/emergent results were called by telephone at the time of interpretation on 04/07/2023 at 1:16 am to provider Dr. Dolores Frame, who verbally acknowledged these results. Electronically Signed   By: Rise Mu M.D.   On: 04/07/2023 01:25   MR BRAIN WO CONTRAST  Result Date: 04/06/2023 CLINICAL DATA:  Provided history: Mental status change, unknown cause. Speech difficulty. EXAM: MRI HEAD WITHOUT CONTRAST TECHNIQUE: Multiplanar, multiecho pulse sequences of the brain and surrounding structures were obtained without intravenous contrast. COMPARISON:  Head CT 06/18/2021. FINDINGS: Brain: No age advanced or lobar predominant parenchymal atrophy. Small acute  cortical infarct within the left insula and adjacent left parietal lobe. Petechial hemorrhage also present at this site. 11 mm chronic white matter infarct within the right parietal lobe. Adjacent small chronic cortical infarct within the right parietal lobe (with a small amount of chronic hemosiderin deposition at this site). Moderate multifocal T2 FLAIR hyperintense signal abnormality elsewhere within the cerebral white matter, nonspecific but compatible chronic small vessel ischemic disease. Chronic lacunar infarcts within the bilateral deep gray nuclei. Mild chronic small vessel ischemic changes within the pons. Small chronic infarcts within the cerebellar vermis and bilateral cerebellar hemispheres. Chronic microhemorrhages within the supratentorial and infratentorial brain (with a deep gray nuclei, brainstem and cerebellar predominance). No evidence of an intracranial mass. No extra-axial fluid collection. No midline shift. Vascular: Maintained flow voids within the proximal large arterial vessels. Skull and upper cervical spine: No focal worrisome marrow lesion. Sinuses/Orbits: No mass or acute finding within the imaged orbits. No significant paranasal sinus disease. Impression #1 called by telephone at the time of interpretation on 04/06/2023 at 6:30 pm to provider PHILLIP STAFFORD , who verbally acknowledged these results. IMPRESSION: 1. Small acute cortical infarct within the left insula and adjacent left parietal lobe. Petechial hemorrhage also present at this site. 2. Background chronic small vessel ischemic disease and chronic infarcts as outlined. 3. Chronic microhemorrhages within the supratentorial and infratentorial brain, in a distribution suggesting sequelae of chronic hypertensive microangiopathy. Electronically Signed   By: Jackey Loge D.O.   On: 04/06/2023 18:31    Microbiology: Results for orders placed or performed during the hospital encounter of 06/18/21  Resp Panel by RT-PCR (Flu A&B,  Covid) Nasopharyngeal Swab     Status: None   Collection Time: 06/18/21  7:50 PM   Specimen: Nasopharyngeal Swab; Nasopharyngeal(NP) swabs in vial transport medium  Result  Value Ref Range Status   SARS Coronavirus 2 by RT PCR NEGATIVE NEGATIVE Final    Comment: (NOTE) SARS-CoV-2 target nucleic acids are NOT DETECTED.  The SARS-CoV-2 RNA is generally detectable in upper respiratory specimens during the acute phase of infection. The lowest concentration of SARS-CoV-2 viral copies this assay can detect is 138 copies/mL. A negative result does not preclude SARS-Cov-2 infection and should not be used as the sole basis for treatment or other patient management decisions. A negative result may occur with  improper specimen collection/handling, submission of specimen other than nasopharyngeal swab, presence of viral mutation(s) within the areas targeted by this assay, and inadequate number of viral copies(<138 copies/mL). A negative result must be combined with clinical observations, patient history, and epidemiological information. The expected result is Negative.  Fact Sheet for Patients:  BloggerCourse.com  Fact Sheet for Healthcare Providers:  SeriousBroker.it  This test is no t yet approved or cleared by the [place] FDA and  has been authorized for detection and/or diagnosis of SARS-CoV-2 by FDA under an Emergency Use Authorization (EUA). This EUA will remain  in effect (meaning this test can be used) for the duration of the COVID-19 declaration under Section 564(b)(1) of the Act, 21 U.S.C.section 360bbb-3(b)(1), unless the authorization is terminated  or revoked sooner.       Influenza A by PCR NEGATIVE NEGATIVE Final   Influenza B by PCR NEGATIVE NEGATIVE Final    Comment: (NOTE) The Xpert Xpress SARS-CoV-2/FLU/RSV plus assay is intended as an aid in the diagnosis of influenza from Nasopharyngeal swab specimens and should  not be used as a sole basis for treatment. Nasal washings and aspirates are unacceptable for Xpert Xpress SARS-CoV-2/FLU/RSV testing.  Fact Sheet for Patients: BloggerCourse.com  Fact Sheet for Healthcare Providers: SeriousBroker.it  This test is not yet approved or cleared by the [place] FDA and has been authorized for detection and/or diagnosis of SARS-CoV-2 by FDA under an Emergency Use Authorization (EUA). This EUA will remain in effect (meaning this test can be used) for the duration of the COVID-19 declaration under Section 564(b)(1) of the Act, 21 U.S.C. section 360bbb-3(b)(1), unless the authorization is terminated or revoked.  Performed at [facility], [address], [place], [place] [address]   Surgical PCR screen     Status: None   Collection Time: 06/18/21  8:39 PM   Specimen: Nasal Mucosa; Nasal Swab  Result Value Ref Range Status   MRSA, PCR NEGATIVE NEGATIVE Final   Staphylococcus aureus NEGATIVE NEGATIVE Final    Comment: (NOTE) The Xpert SA Assay (FDA approved for NASAL specimens in patients 55 years of age and older), is one component of a comprehensive surveillance program. It is not intended to diagnose infection nor to guide or monitor treatment. Performed at [facility], [address] [place], [place] [address]   Resp Panel by RT-PCR (Flu A&B, Covid) Nasopharyngeal Swab     Status: None   Collection Time: 06/22/21 10:19 AM   Specimen: Nasopharyngeal Swab; Nasopharyngeal(NP) swabs in vial transport medium  Result Value Ref Range Status   SARS Coronavirus 2 by RT PCR NEGATIVE NEGATIVE Final    Comment: (NOTE) SARS-CoV-2 target nucleic acids are NOT DETECTED.  The SARS-CoV-2 RNA is generally detectable in upper respiratory specimens during the acute phase of infection. The lowest concentration of SARS-CoV-2 viral copies this assay can detect is 138 copies/mL. A negative  result does not preclude SARS-Cov-2 infection and should not be used as the sole  basis for treatment or other patient management decisions. A negative result may occur with  improper specimen collection/handling, submission of specimen other than nasopharyngeal swab, presence of viral mutation(s) within the areas targeted by this assay, and inadequate number of viral copies(<138 copies/mL). A negative result must be combined with clinical observations, patient history, and epidemiological information. The expected result is Negative.  Fact Sheet for Patients:  BloggerCourse.com  Fact Sheet for Healthcare Providers:  SeriousBroker.it  This test is no t yet approved or cleared by the [place] FDA and  has been authorized for detection and/or diagnosis of SARS-CoV-2 by FDA under an Emergency Use Authorization (EUA). This EUA will remain  in effect (meaning this test can be used) for the duration of the COVID-19 declaration under Section 564(b)(1) of the Act, 21 U.S.C.section 360bbb-3(b)(1), unless the authorization is terminated  or revoked sooner.       Influenza A by PCR NEGATIVE NEGATIVE Final   Influenza B by PCR NEGATIVE NEGATIVE Final    Comment: (NOTE) The Xpert Xpress SARS-CoV-2/FLU/RSV plus assay is intended as an aid in the diagnosis of influenza from Nasopharyngeal swab specimens and should not be used as a sole basis for treatment. Nasal washings and aspirates are unacceptable for Xpert Xpress SARS-CoV-2/FLU/RSV testing.  Fact Sheet for Patients: BloggerCourse.com  Fact Sheet for Healthcare Providers: SeriousBroker.it  This test is not yet approved or cleared by the [place] FDA and has been authorized for detection and/or diagnosis of SARS-CoV-2 by FDA under an Emergency Use Authorization (EUA). This EUA will remain in effect (meaning this test can be used)  for the duration of the COVID-19 declaration under Section 564(b)(1) of the Act, 21 U.S.C. section 360bbb-3(b)(1), unless the authorization is terminated or revoked.  Performed at [facility], [address] [place], [place] [address]     Labs: CBC: Recent Labs  Lab 04/06/23 1445 04/07/23 0447 04/08/23 0603 04/09/23 0301  WBC 9.6 10.3 10.3 9.2  HGB 13.0 12.3 12.9 11.3*  HCT 39.6 38.0 39.0 34.8*  MCV 85.0 86.0 83.5 85.3  PLT 231 331 345 303   Basic Metabolic Panel: Recent Labs  Lab 04/06/23 1445 04/07/23 0447 04/08/23 0458 04/09/23 0301  NA 137 137 136 136  K 3.2* 4.1 4.1 4.0  CL 105 103 105 106  CO2 22 24 22  21*  GLUCOSE 191* 196* 140* 203*  BUN 20 19 25* 29*  CREATININE 1.10* 1.07* 1.36* 1.27*  CALCIUM 7.9* 8.0* 7.9* 7.6*  MG  --   --  1.7  --   PHOS  --   --  3.0  --    Liver Function Tests: Recent Labs  Lab 04/06/23 1445  AST 22  ALT 36  ALKPHOS 102  BILITOT 0.5  PROT 6.6  ALBUMIN 3.6   CBG: Recent Labs  Lab 04/07/23 2129 04/08/23 0929 04/08/23 1149 04/08/23 1629 04/09/23 0829  GLUCAP 145* 137* 137* 173* 127*    Discharge time spent: greater than 30 minutes.  Signed: Marrion Coy, MD [facility] 04/09/2023

## 2023-04-09 NOTE — TOC Progression Note (Addendum)
Transition of Care [facility]) - Progression Note    Patient Details  Name: Erin Hayes MRN: [id number] Date of Birth: 1965/11/06  Transition [facility] CM/SW Contact  Truddie Hidden, RN Phone Number: 04/09/2023, 10:41 AM  Clinical Narrative:    Spoke with patient regarding her discharge plan and therapy's recommendation. Patient is agreeable to [facility] PT/ OT. She does not have a preference. Patient advised the accepting agency will contact her directly to scheduled SOC within 48 post discharge. RW and [facility] request sent to Jon from [employer].   Referral sent and accepted by Elnita Maxwell from [facility] for HHPT/ OT/RN.           Expected Discharge Plan and Services                                               Social Determinants of Health (SDOH) Interventions SDOH Screenings   Food Insecurity: No Food Insecurity (04/08/2023)  Housing: Low Risk  (04/08/2023)  Transportation Needs: No Transportation Needs (04/08/2023)  Utilities: Not At Risk (04/08/2023)  Alcohol Screen: Low Risk  (04/08/2023)  Depression (PHQ2-9): Low Risk  (01/31/2023)  Financial Resource Strain: Low Risk  (04/08/2023)  Physical Activity: Unknown (12/07/2022)  Social Connections: Moderately Isolated (12/07/2022)  Stress: No Stress Concern Present (12/07/2022)  Tobacco Use: Low Risk  (04/08/2023)  Health Literacy: Adequate Health Literacy (12/07/2022)    Readmission Risk Interventions     No data to display

## 2023-04-09 NOTE — Progress Notes (Signed)
Rounding Note    Patient Name: Erin Hayes Date of Encounter: 04/09/2023  [facility] Cardiologist: Julien Nordmann, MD   Subjective   Dasiyah continues to be asymptomatic from a cardiac standpoint today. She denies shortness of breath and chest pain. She remains in NSR with rates in the 80s.   Inpatient Medications    Scheduled Meds:  hydrALAZINE  25 mg Oral TID   insulin aspart  0-9 Units Subcutaneous TID WC   metoprolol succinate  25 mg Oral Daily   multivitamin with minerals  1 tablet Oral Daily   Rivaroxaban  15 mg Oral BID WC   Followed by   Melene Muller ON 04/30/2023] rivaroxaban  20 mg Oral Q supper   rosuvastatin  20 mg Oral Daily   sacubitril-valsartan  1 tablet Oral BID   Continuous Infusions:  PRN Meds: acetaminophen **OR** acetaminophen, albuterol, magnesium hydroxide, ondansetron **OR** ondansetron (ZOFRAN) IV, traZODone   Vital Signs    Vitals:   04/09/23 0600 04/09/23 0834 04/09/23 0843 04/09/23 1146  BP:  (!) 147/104 (!) 92/39 135/88  Pulse:  87 83 83  Resp: 18 19 20 18   Temp:  98.7 F (37.1 C)  98.9 F (37.2 C)  TempSrc:  Oral  Oral  SpO2:  100%  99%  Weight:      Height:        Intake/Output Summary (Last 24 hours) at 04/09/2023 1206 Last data filed at 04/09/2023 0700 Gross per 24 hour  Intake 1034 ml  Output 700 ml  Net 334 ml      04/07/2023    9:01 PM 04/07/2023    2:00 AM 03/19/2023    9:09 AM  Last 3 Weights  Weight (lbs) 121 lb 7.6 oz 120 lb 120 lb  Weight (kg) 55.1 kg 54.432 kg 54.432 kg      Telemetry    Normal sinus rhythm with rates in the 80s - Personally Reviewed  ECG    No new - Personally Reviewed  Physical Exam   GEN: No acute distress.   Neck: No JVD Cardiac: RRR, no murmurs, rubs, or gallops.  Respiratory: Clear to auscultation bilaterally. GI: Soft, nontender, non-distended  MS: No edema; No deformity. Neuro:  Nonfocal  Psych: Normal affect   Labs    High Sensitivity Troponin:  No results for  input(s): "TROPONINIHS" in the last 720 hours.   Chemistry Recent Labs  Lab 04/06/23 1445 04/07/23 0447 04/08/23 0458 04/09/23 0301  NA 137 137 136 136  K 3.2* 4.1 4.1 4.0  CL 105 103 105 106  CO2 22 24 22  21*  GLUCOSE 191* 196* 140* 203*  BUN 20 19 25* 29*  CREATININE 1.10* 1.07* 1.36* 1.27*  CALCIUM 7.9* 8.0* 7.9* 7.6*  MG  --   --  1.7  --   PROT 6.6  --   --   --   ALBUMIN 3.6  --   --   --   AST 22  --   --   --   ALT 36  --   --   --   ALKPHOS 102  --   --   --   BILITOT 0.5  --   --   --   GFRNONAA 58* 60* 45* 49*  ANIONGAP 10 10 9 9     Lipids  Recent Labs  Lab 04/07/23 0447  CHOL 217*  TRIG 106  HDL 65  LDLCALC 131*  CHOLHDL 3.3    Hematology Recent Labs  Lab 04/07/23 0447 04/08/23 0603 04/09/23 0301  WBC 10.3 10.3 9.2  RBC 4.42 4.67 4.08  HGB 12.3 12.9 11.3*  HCT 38.0 39.0 34.8*  MCV 86.0 83.5 85.3  MCH 27.8 27.6 27.7  MCHC 32.4 33.1 32.5  RDW 13.7 13.9 13.9  PLT 331 345 303   Thyroid No results for input(s): "TSH", "FREET4" in the last 168 hours.  BNPNo results for input(s): "BNP", "PROBNP" in the last 168 hours.  DDimer No results for input(s): "DDIMER" in the last 168 hours.   Radiology    ECHOCARDIOGRAM COMPLETE BUBBLE STUDY  Result Date: 04/07/2023    ECHOCARDIOGRAM REPORT   Patient Name:   Erin Hayes Date of Exam: 04/07/2023 Medical Rec #:  [id number]      Height:       59.0 in Accession #:    [id number]     Weight:       120.0 lb Date of Birth:  [birth year]      BSA:          1.484 m Patient Age:    57 years       BP:           125/89 mmHg Patient Gender: F              HR:           85 bpm. Exam Location:  [facility] Procedure: 2D Echo and Saline Contrast Bubble Study Indications:     Stroke 434.91/I63.9  History:         Patient has prior history of Echocardiogram examinations.  Sonographer:     Elwin Sleight RDCS Referring Phys:  [id number] Vernetta Honey MANSY Diagnosing Phys: Chilton Si MD  Sonographer Comments: Image acquisition challenging due to  respiratory motion. IMPRESSIONS  1. Left ventricular ejection fraction, by estimation, is 20 to 25%. The left ventricle has severely decreased function. The left ventricle demonstrates regional wall motion abnormalities (see scoring diagram/findings for description). There is mild concentric left ventricular hypertrophy. Left ventricular diastolic parameters are consistent with Grade I diastolic dysfunction (impaired relaxation).  2. Right ventricular systolic function is normal. The right ventricular size is normal.  3. A small pericardial effusion is present.  4. The mitral valve is normal in structure. No evidence of mitral valve regurgitation. No evidence of mitral stenosis.  5. The aortic valve is tricuspid. Aortic valve regurgitation is not visualized. No aortic stenosis is present.  6. The inferior vena cava is normal in size with greater than 50% respiratory variability, suggesting right atrial pressure of 3 mmHg.  7. Agitated saline contrast bubble study was negative, with no evidence of any interatrial shunt. FINDINGS  Left Ventricle: Left ventricular ejection fraction, by estimation, is 20 to 25%. The left ventricle has severely decreased function. The left ventricle demonstrates regional wall motion abnormalities. The left ventricular internal cavity size was normal  in size. There is mild concentric left ventricular hypertrophy. Left ventricular diastolic parameters are consistent with Grade I diastolic dysfunction (impaired relaxation).  LV Wall Scoring: The inferior wall, mid anteroseptal segment, mid inferoseptal segment, and basal inferoseptal segment are akinetic. The mid and distal anterior wall, mid and distal lateral wall, entire apex, posterior wall, and basal anteroseptal segment are hypokinetic. The antero-lateral wall and basal anterior segment are normal. Right Ventricle: The right ventricular size is normal. No increase in right ventricular wall thickness. Right ventricular systolic  function is normal. Left Atrium: Left atrial size was normal in size. Right Atrium: Right  atrial size was normal in size. Pericardium: A small pericardial effusion is present. Mitral Valve: The mitral valve is normal in structure. No evidence of mitral valve regurgitation. No evidence of mitral valve stenosis. Tricuspid Valve: The tricuspid valve is normal in structure. Tricuspid valve regurgitation is not demonstrated. No evidence of tricuspid stenosis. Aortic Valve: The aortic valve is tricuspid. Aortic valve regurgitation is not visualized. No aortic stenosis is present. Aortic valve peak gradient measures 8.3 mmHg. Pulmonic Valve: The pulmonic valve was normal in structure. Pulmonic valve regurgitation is not visualized. No evidence of pulmonic stenosis. Aorta: The aortic root is normal in size and structure. Venous: The inferior vena cava is normal in size with greater than 50% respiratory variability, suggesting right atrial pressure of 3 mmHg. IAS/Shunts: No atrial level shunt detected by color flow Doppler. Agitated saline contrast was given intravenously to evaluate for intracardiac shunting. Agitated saline contrast bubble study was negative, with no evidence of any interatrial shunt.  LEFT VENTRICLE PLAX 2D LVIDd:         3.45 cm LV PW:         1.15 cm LV IVS:        1.05 cm LVOT diam:     1.70 cm LV SV:         38 LV SV Index:   26 LVOT Area:     2.27 cm  LV Volumes (MOD) LV vol d, MOD A2C: 83.3 ml LV vol d, MOD A4C: 92.1 ml LV vol s, MOD A2C: 47.9 ml LV vol s, MOD A4C: 50.1 ml LV SV MOD A2C:     35.4 ml LV SV MOD A4C:     92.1 ml LV SV MOD BP:      39.2 ml RIGHT VENTRICLE RV Basal diam:  2.80 cm RV S prime:     9.03 cm/s TAPSE (M-mode): 2.0 cm LEFT ATRIUM             Index        RIGHT ATRIUM           Index LA Vol (A2C):   28.3 ml 19.06 ml/m  RA Area:     12.30 cm LA Vol (A4C):   29.8 ml 20.08 ml/m  RA Volume:   29.10 ml  19.60 ml/m LA Biplane Vol: 29.7 ml 20.01 ml/m  AORTIC VALVE                  PULMONIC VALVE AV Area (Vmax): 1.66 cm     PV Vmax:        1.11 m/s AV Vmax:        144.00 cm/s  PV Peak grad:   4.9 mmHg AV Peak Grad:   8.3 mmHg     RVOT Peak grad: 2 mmHg LVOT Vmax:      105.00 cm/s LVOT Vmean:     65.900 cm/s LVOT VTI:       0.167 m  AORTA Ao Root diam: 3.00 cm Ao Asc diam:  2.70 cm  SHUNTS Systemic VTI:  0.17 m Systemic Diam: 1.70 cm Chilton Si MD Electronically signed by Chilton Si MD Signature Date/Time: 04/07/2023/4:25:40 PM    Final     Cardiac Studies   04/07/2023 Echo complete with bubble study  IMPRESSIONS   1. Left ventricular ejection fraction, by estimation, is 20 to 25%. The  left ventricle has severely decreased function. The left ventricle  demonstrates regional wall motion abnormalities (see scoring  diagram/findings for description). There is  mild  concentric left ventricular hypertrophy. Left ventricular diastolic  parameters are consistent with Grade I diastolic dysfunction (impaired  relaxation).   2. Right ventricular systolic function is normal. The right ventricular  size is normal.   3. A small pericardial effusion is present.   4. The mitral valve is normal in structure. No evidence of mitral valve  regurgitation. No evidence of mitral stenosis.   5. The aortic valve is tricuspid. Aortic valve regurgitation is not  visualized. No aortic stenosis is present.   6. The inferior vena cava is normal in size with greater than 50%  respiratory variability, suggesting right atrial pressure of 3 mmHg.   7. Agitated saline contrast bubble study was negative, with no evidence  of any interatrial shunt.   FINDINGS  Left Ventricle: Left ventricular ejection fraction, by estimation, is 20  to 25%. The left ventricle has severely decreased function. The left  ventricle demonstrates regional wall motion abnormalities. The left  ventricular internal cavity size was normal   in size. There is mild concentric left ventricular hypertrophy. Left   ventricular diastolic parameters are consistent with Grade I diastolic  dysfunction (impaired relaxation).   Patient Profile     57 y.o. female with hx of chronic combined systolic and diastolic CHF, NICM, HTN, HL, DMII, and DVT is being seen today for continued evaluation of reduced EF.   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 However, echocardiogram from 11/2022 showed EF 25%.  - Echo from this hospitalization shows EF 20-25%, bubble study negative for right to left shunt - Patient appears euvolemic on exam and denies SOB/orthopnea/PND - Continue PTA Toprol, Entresto, and restart spironolactone when primary is comfortable with BPs - Consider addition of SGLT2i  Primary HTN - Bps have been labile with the lowest today 92/39 and highest 147/104 - Continue with current regimen with plan to resume spironolactone when primary is comfortable with BPs  HL - Most recent lipid panel 04/07/23 LDL 131, TC 217 - Patient is likely non-compliant with prescribed rosuvastatin 20 mg daily - Encouraged compliance on discharge  TII DM - Management per IM  For questions or updates, please contact [facility] Please consult [email or web address] for contact info under        Signed, Orion Crook, PA-C   04/09/2023, 12:06 PM

## 2023-04-09 NOTE — Consult Note (Signed)
PHARMACY - ANTICOAGULATION CONSULT NOTE  Pharmacy Consult for Xarelto Indication: pulmonary embolus  No Known Allergies  Patient Measurements: Height: 4\' 11"  (149.9 cm) Weight: 55.1 kg (121 lb 7.6 oz) IBW/kg (Calculated) : 43.2  Vital Signs: Temp: 98.7 F (37.1 C) (12/03 0834) Temp Source: Oral (12/03 0834) BP: 147/104 (12/03 0834) Pulse Rate: 87 (12/03 0834)  Labs: Recent Labs    04/07/23 0447 04/07/23 1447 04/08/23 0458 04/08/23 0603 04/08/23 1259 04/09/23 0301  HGB 12.3  --   --  12.9  --  11.3*  HCT 38.0  --   --  39.0  --  34.8*  PLT 331  --   --  345  --  303  APTT 26  --   --   --   --   --   LABPROT 14.2  --   --   --   --   --   INR 1.1  --   --   --   --   --   HEPARINUNFRC <0.10*   < >  --  0.48 0.51 0.32  CREATININE 1.07*  --  1.36*  --   --  1.27*   < > = values in this interval not displayed.   Estimated Creatinine Clearance: 36.1 mL/min (A) (by C-G formula based on SCr of 1.27 mg/dL (H)).  Medical History: Past Medical History:  Diagnosis Date   Carpal tunnel syndrome of right wrist    Chronic combined systolic (congestive) and diastolic (congestive) heart failure (HCC)    a. 03/2017 Echo: EF 20-25%, diff HK, Gr2 DD; b. 09/2017 Echo: EF 20-25%, diff HK, Gr2 DD; c. 04/2020 Echo: EF 45-50%, mild LVH, antsept/basal ant HK, nl RV fxn; d. 11/2022 Echo: EF 25%, basal, mid & apical and antsept AK/DK.; e. 04/2023 Echo: EF 20-25%, mild conc LVH, GrI DD, nl RV fxn, small pericardial effusion.   Diabetes (HCC)    GERD (gastroesophageal reflux disease)    HLD (hyperlipidemia)    Hypertension    Lower leg DVT (deep venous thrombosis) (HCC)    NICM (nonischemic cardiomyopathy) (HCC)    a. 03/2017 Echo: EF 20-25%; b. 03/2017 Cath: mild nonobs dzs, EF 25%; c. 09/2017 Echo: EF 20-25%; d. 04/2020 Echo: EF 45-50%; e. 11/2022 Echo: EF 25%; f. 11/2022 MV: small, mild, fixed antsept/infsept defects, likely 2/2 LBBB->low risk; g. 04/2023 Echo: EF 20-25%.   Pleural effusion,  left    a. 03/2017 s/p thoracentesis-->300 ml withdrawn--transudative.   Pneumonia 02/19/2017   Pulmonary embolism (HCC)    Stroke [facility])     Medications:  Currently on heparin infusion  Assessment: 57 yo female with h/o DVT on Eliquis, presenting to ED due to confusion, found with "marked severity right-sided pulmonary embolism with extension to involve multiple upper lobe branches." Previously had a DVT in 11/2021. Poor compliance of apixaban. MRI reveals a small acute cortical infarct within the left insula and adjacent left parietal lobe.   Verified with Dr Francee Piccolo (neuro) - okay to start  Xarelto therapeutic VTE dose. CrCl 36 ml/min  Plan:  Xarelto 15mg  po BID x 21 days, then 20mg  po daily  Naif Alabi Rodriguez-Guzman PharmD, BCPS 04/09/2023 8:46 AM

## 2023-04-09 NOTE — Progress Notes (Signed)
Physical Therapy Treatment Patient Details Name: Erin Hayes MRN: [id number] DOB: [birth year] Today's Date: 04/09/2023   History of Present Illness Pt is 57 y/o admitted 04/06/23 for Acute CVA. Pt presented to hospital with reports of altered mental status/confusion and abdominal pain. PmHx includes: HTN, GERD, systolic an ddiastolic CHF, dyslipidemia, and nonischemic cardiomyopathy.    PT Comments  Pt received in bed and agreed to PT session. Pt performed bed mobility ModI, STS with the use of RW (2wheels) SUP, and amb with RW ~265ft CGA. VC necessary during session for RW management. During amb, pt demonstrated right drifting that was self corrected when almost bumping into the wall or self corrected after bumping into an item. Pt presents close to baseline, however expressed the use of a WC daily due to full body fatigue. Pt stated that she can get multiple tasks done at a time when sitting down, so uses the [facility] often to complete activities that could be strenuous. Pt currently lives at home with her daughter who will be at work during the day. Pt expresses that she will be reaching out to family/friends to see if anyone could stop by during the day to assist her as necessary. Pt tolerated Tx well and will continue to benefit from skilled PT sessions to improve balance, safety awareness with mobility, activity tolerance and functional mobility to maximize safety/return to PLOF following D/C.    If plan is discharge home, recommend the following: A little help with walking and/or transfers;Help with stairs or ramp for entrance;Supervision due to cognitive status   Can travel by private vehicle        Equipment Recommendations  Rolling walker (2 wheels);BSC/3in1    Recommendations for Other Services       Precautions / Restrictions Precautions Precautions: Fall Restrictions Weight Bearing Restrictions: No     Mobility  Bed Mobility Overal bed mobility: Modified Independent              General bed mobility comments: Pt performed bed mobility ModI and did not report any s/sx relative to dizziness while seated EOB.    Transfers Overall transfer level: Needs assistance Equipment used: Rolling walker (2 wheels) Transfers: Sit to/from Stand Sit to Stand: Supervision           General transfer comment: Pt performed STS with the use of RW (2wheels) SUP. Pt did not report any s/sx relative to dizziness when standing.    Ambulation/Gait Ambulation/Gait assistance: Contact guard assist Gait Distance (Feet): 200 Feet Assistive device: Rolling walker (2 wheels) Gait Pattern/deviations: Step-through pattern Gait velocity: decreased     General Gait Details: Pt amb with the use of RW (2wheels) CGA while wearing boot on LLE. Pt presented with a right lean/drift when amb.   Stairs             Wheelchair Mobility     Tilt Bed    Modified Rankin (Stroke Patients Only)       Balance Overall balance assessment: Needs assistance Sitting-balance support: Feet supported Sitting balance-Leahy Scale: Good     Standing balance support: Bilateral upper extremity supported, During functional activity Standing balance-Leahy Scale: Fair                              Cognition Arousal: Alert Behavior During Therapy: WFL for tasks assessed/performed Overall Cognitive Status: Within Functional Limits for tasks assessed Area of Impairment: Safety/judgement  Safety/Judgement: Decreased awareness of safety, Decreased awareness of deficits     General Comments: AOx4. Pt pleasant and willing to participate in PT session.        Exercises      General Comments        Pertinent Vitals/Pain Pain Assessment Pain Assessment: No/denies pain    Home Living                          Prior Function            PT Goals (current goals can now be found in the care plan section) Acute Rehab PT  Goals Patient Stated Goal: To go home PT Goal Formulation: With patient Time For Goal Achievement: 04/22/23 Potential to Achieve Goals: Good Progress towards PT goals: Progressing toward goals    Frequency    Min 1X/week      PT Plan      Co-evaluation              AM-PAC PT "6 Clicks" Mobility   Outcome Measure  Help needed turning from your back to your side while in a flat bed without using bedrails?: None Help needed moving from lying on your back to sitting on the side of a flat bed without using bedrails?: None Help needed moving to and from a bed to a chair (including a wheelchair)?: A Little Help needed standing up from a chair using your arms (e.g., wheelchair or bedside chair)?: A Little Help needed to walk in hospital room?: A Little Help needed climbing 3-5 steps with a railing? : A Little 6 Click Score: 20    End of Session Equipment Utilized During Treatment: Gait belt Activity Tolerance: Patient tolerated treatment well Patient left: in chair;with call bell/phone within reach;with chair alarm set Nurse Communication: Mobility status PT Visit Diagnosis: Other abnormalities of gait and mobility (R26.89);Difficulty in walking, not elsewhere classified (R26.2)     Time: 3474-2595 PT Time Calculation (min) (ACUTE ONLY): 22 min  Charges:    $Gait Training: 8-22 mins PT General Charges $$ ACUTE PT VISIT: 1 Visit                     Esgar Barnick Sauvignon Howard SPT, LAT, ATC   Faraz Ponciano Sauvignon-Howard 04/09/2023, 1:55 PM

## 2023-04-09 NOTE — Progress Notes (Signed)
Heart Failure Stewardship Pharmacy Note  PCP: Lorre Munroe, NP PCP-Cardiologist: Julien Nordmann, MD  HPI: Erin Hayes is a 57 y.o. female with chronic combined systolic and diastolic CHF, GERD, hypertension, T2DM, dyslipidemia and nonischemic cardiomyopathy who presented with acute onset AMS. CT head 03/27/23 showed acute distal M4 occlusion. CTA PE 04/07/23 showed right -sided PE with extension to multiple upper lobe branches, moderate to large pericardial effusion. Echo with bubble study performed on 04/07/23 showing LVEF of 20-25%, mild concentric LVH, grade I diastolic dysfunction, normal RV function, and negative bubble study. Reports that Entresto and spironolactone were stopped her most recent hospital admission and she had been off of it for a few months before recently restarting at her last outpatient visit.  Pertinent cardiac history: Echo in 03/2017 showed LVEF of 20-25% with grade II diastolic dysfunction. LHC during the admission showed mild nonobstructive CAD. LVEF was unchanged on echo in 09/2017. LVEF improved to 45-50% in 05/2019. Stress test in 11/2022 showed LVEF normal and perfusion defects representative of LBBB.   Pertinent Lab Values: Creat  Date Value Ref Range Status  01/31/2023 0.81 0.50 - 1.03 mg/dL Final   Creatinine, Ser  Date Value Ref Range Status  04/09/2023 1.27 (H) 0.44 - 1.00 mg/dL Final   BUN  Date Value Ref Range Status  04/09/2023 29 (H) 6 - 20 mg/dL Final  78/46/9629 14 6 - 24 mg/dL Final  52/84/1324 17 7 - 18 mg/dL Final   Potassium  Date Value Ref Range Status  04/09/2023 4.0 3.5 - 5.1 mmol/L Final  04/25/2013 5.7 (H) 3.5 - 5.1 mmol/L Final   Sodium  Date Value Ref Range Status  04/09/2023 136 135 - 145 mmol/L Final  06/19/2018 142 134 - 144 mmol/L Final  04/25/2013 129 (L) 136 - 145 mmol/L Final   B Natriuretic Peptide  Date Value Ref Range Status  04/25/2022 653.8 (H) 0.0 - 100.0 pg/mL Final    Comment:    Performed at [facility], [address], [place], [place] [address]   Magnesium  Date Value Ref Range Status  04/08/2023 1.7 1.7 - 2.4 mg/dL Final    Comment:    Performed at [facility], [address] [place], [place] [address]   Hgb A1c MFr Bld  Date Value Ref Range Status  01/31/2023 7.8 (H) <5.7 % of total Hgb Final    Comment:    For someone without known diabetes, a hemoglobin A1c value of 6.5% or greater indicates that they may have  diabetes and this should be confirmed with a follow-up  test. . For someone with known diabetes, a value <7% indicates  that their diabetes is well controlled and a value  greater than or equal to 7% indicates suboptimal  control. A1c targets should be individualized based on  duration of diabetes, age, comorbid conditions, and  other considerations. . Currently, no consensus exists regarding use of hemoglobin A1c for diagnosis of diabetes for children. .    TSH  Date Value Ref Range Status  04/04/2017 0.975 0.350 - 4.500 uIU/mL Final    Comment:    Performed by a 3rd Generation assay with a functional sensitivity of <=0.01 uIU/mL.   Vital Signs: Admission weight: 121.47 lbs Temp:  [98.7 F (37.1 C)] 98.7 F (37.1 C) (12/03 0319) Pulse Rate:  [79-86] 79 (12/03 0319) Cardiac Rhythm: Normal sinus rhythm (12/02 2036) Resp:  [15-20] 18 (12/03 0600) BP: (113-151)/(71-91) 128/83 (12/03 0319) SpO2:  [96 %-99 %]  99 % (12/03 0319)  Intake/Output Summary (Last 24 hours) at 04/09/2023 0745 Last data filed at 04/09/2023 0700 Gross per 24 hour  Intake 1034 ml  Output 700 ml  Net 334 ml   Current Heart Failure Medications:  Loop diuretic: none Beta-Blocker: metoprolol succinate 25 mg daily ACEI/ARB/ARNI: Entresto 97-103 mg bid MRA: none SGLT2i: none Other: hydralazine 25 mg TID  Prior to admission Heart Failure Medications:  Loop diuretic: furosemide 40 mg daily Beta-Blocker: metoprolol succinate 25 mg daily ACEI/ARB/ARNI:  Entresto 97-103 mg bid MRA: none SGLT2i: none Other: hydralazine 25 mg TID  Assessment: 1. Acute on chronic combined systolic and diastolic heart failure (LVEF initially recovered, but now 20-25%) with grade I diastolic dysfunction, due to NICM. NYHA class II symptoms.  -Symptoms: Denies any HF symptoms today. -Volume: Appears euvolemic. Suspect AKI on admission could be due to hypovolemia with recent restart of furosemide along with other HF medications. Agree with continuing to hold furosemide. With creatinine trending down recommend starting Farxiga tomorrow -Hemodynamics: BP now elevated. HR stable in 80s. -BB: Can consider increasing metoprolol dose given HR stable in 80s. -ACEI/ARB/ARNI: Continue Entresto 97-103 mg BID -MRA: Agree with holding MRA for now given AKI. This can likely be restarted when creatinine normalizes. -SGLT2i: Would likely benefit from its addition for HF, DM, and CKD. Would consider adding today if discharging or tomorrow if remaining inpatient. -Other: hydralazine 25 mg TID. Can consider adding isosorbide to approximate BiDil. -Patient has not filled Eliquis in at least 6 months per dispense records. Heme/Onc does not believe to be Eliquis failure and will transition to Xarelto for adherence.  Plan: 1) Medication changes recommended at this time: -Would recommend stopping oral furosemide at discharge -Consider adding isosorbide ER 30 mg daily to current hydralazine to approximate BiDil. -Consider starting Farxiga 10 mg daily before discharge  2) Patient assistance: -Copays for Eliquis, Xarelto, Pradaxa, Lovenox, Farxiga, Jardiance, and Entresto are $0.  3) Education: - Patient has been educated on current HF medications and potential additions to HF medication regimen - Patient verbalizes understanding that over the next few months, these medication doses may change and more medications may be added to optimize HF regimen - Patient has been educated on basic  disease state pathophysiology and goals of therapy  Medication Assistance / Insurance Benefits Check: Does the patient have prescription insurance?    Type of insurance plan:  Does the patient qualify for medication assistance through manufacturers or grants? Pending   Outpatient Pharmacy: Prior to admission outpatient pharmacy: [facility]     Please do not hesitate to reach out with questions or concerns,  Enos Fling, PharmD, CPP, BCPS Heart Failure Pharmacist  Phone - [phone] 04/09/2023 7:45 AM

## 2023-04-10 LAB — CARDIOLIPIN ANTIBODIES, IGG, IGM, IGA
Anticardiolipin IgA: <9 [APL'U]/mL (ref 0–11)
Anticardiolipin IgG: <9 [GPL'U]/mL (ref 0–14)
Anticardiolipin IgM: 22 [MPL'U]/mL — ABNORMAL HIGH (ref 0–12)

## 2023-04-10 LAB — PROTEIN C, TOTAL: Protein C, Total: 103 % (ref 60–150)

## 2023-04-11 ENCOUNTER — Telehealth: Payer: Self-pay | Admitting: Internal Medicine

## 2023-04-11 LAB — DRVVT CONFIRM: dRVVT Confirm: 1.1 {ratio} (ref 0.8–1.2)

## 2023-04-11 LAB — DRVVT MIX: dRVVT Mix: 41.8 s — ABNORMAL HIGH (ref 0.0–40.4)

## 2023-04-11 LAB — PTT-LA MIX: PTT-LA Mix: 45.5 s — ABNORMAL HIGH (ref 0.0–40.5)

## 2023-04-11 LAB — HEXAGONAL PHASE PHOSPHOLIPID: Hexagonal Phase Phospholipid: 3 s (ref 0–11)

## 2023-04-11 LAB — PROTEIN S ACTIVITY: Protein S Activity: 79 % (ref 63–140)

## 2023-04-11 LAB — LUPUS ANTICOAGULANT PANEL
DRVVT: 51.6 s — ABNORMAL HIGH (ref 0.0–47.0)
PTT Lupus Anticoagulant: 49.9 s — ABNORMAL HIGH (ref 0.0–43.5)

## 2023-04-11 LAB — PROTEIN C ACTIVITY: Protein C Activity: 129 % (ref 73–180)

## 2023-04-11 LAB — PROTEIN S, TOTAL: Protein S Ag, Total: 130 % (ref 60–150)

## 2023-04-11 MED ORDER — BLOOD GLUCOSE TEST VI STRP: 1.0000 | ORAL_STRIP | Freq: Three times a day (TID) | 0 refills | Status: DC

## 2023-04-11 MED ORDER — LANCET DEVICE MISC: 1.0000 | Freq: Three times a day (TID) | 3 refills | Status: AC

## 2023-04-11 MED ORDER — BLOOD GLUCOSE MONITORING SUPPL DEVI: 1.0000 | Freq: Three times a day (TID) | 3 refills | Status: DC

## 2023-04-11 NOTE — Telephone Encounter (Signed)
Okay to send in a meter strips and lancets

## 2023-04-11 NOTE — Telephone Encounter (Signed)
Prescriptions sent to pharmacy. Attempted to reach Melissa at call back number. Left message to return call.

## 2023-04-11 NOTE — Telephone Encounter (Signed)
Efraim Kaufmann, nurse with [facility], has called and states that they had a start of care for patient and that patient needs a glucometer called into the pharmacy. Patient also needs a call for a Hospital Follow Up. Patient was discharged on 04/09/2023 from [facility] and needs HFU with [facility].  [facility] callback # [phone]   [facility] - [place], [place] - [address]  Phone: [phone] Fax: [phone]

## 2023-04-11 NOTE — Addendum Note (Signed)
Addended by: Luanna Cole D on: 04/11/2023 11:39 AM   Modules accepted: Orders

## 2023-04-12 ENCOUNTER — Telehealth: Payer: Self-pay | Admitting: Family

## 2023-04-12 ENCOUNTER — Ambulatory Visit: Payer: Self-pay | Admitting: *Deleted

## 2023-04-12 NOTE — Telephone Encounter (Signed)
Please advise.   Patient has an appointment on Monday 04/15/23

## 2023-04-12 NOTE — Telephone Encounter (Signed)
She has been noncompliant. She needs to take her medications as directed. If she develops extreme headache, vision changes, confusion, dizziness, chest pain or shortness of breath, I would advise her to return to the ED. Otherwise, will discuss at upcoming appt.

## 2023-04-12 NOTE — Telephone Encounter (Signed)
Lvm to confirm appt for 04/15/23

## 2023-04-12 NOTE — Telephone Encounter (Signed)
  Chief Complaint: PT is calling to report elevated BP Symptoms: Patient has elevated BP reading- she has not taken her medication today- but she does report her BP was high at hospital and has been high since discharge. Patient is not having symptoms of high BP Frequency: today Pertinent Negatives: Patient denies blurred vision, chest pain, difficulty breathing, headache, weakness Disposition: [] ED /[] Urgent Care (no appt availability in office) / [x] Appointment(In office/virtual)/ []  [facility]/ [] Home Care/ [] Refused Recommended Disposition /[] [facility]/ []  Follow-up with PCP Additional Notes: Patient advised to take her medication- she needs to bring that number down. Patient does not have a way to check her BP after taking medication- I did schedule her an appointment for Monday in office- patient advised ED for any changes/symptoms.  PT provider states the patient is presently living in hotel room. Note sent to PCP for review

## 2023-04-12 NOTE — Telephone Encounter (Signed)
Patient advised to take medication. If any complications as listed go to ER. Appointment 04/15/23   Verbal understanding

## 2023-04-12 NOTE — Progress Notes (Unsigned)
Advanced Heart Failure Clinic Note   Referring Physician: PCP: Lorre Munroe, NP Cardiologist: Julien Nordmann, MD   HPI:  Erin Hayes is a 57 y/o female with a history of   Admitted 04/06/23 due to      Review of Systems: [y] = yes, [ ]  = no   General: Weight gain [ ] ; Weight loss [ ] ; Anorexia [ ] ; Fatigue [ ] ; Fever [ ] ; Chills [ ] ; Weakness [ ]   Cardiac: Chest pain/pressure [ ] ; Resting SOB [ ] ; Exertional SOB [ ] ; Orthopnea [ ] ; Pedal Edema [ ] ; Palpitations [ ] ; Syncope [ ] ; Presyncope [ ] ; Paroxysmal nocturnal dyspnea[ ]   Pulmonary: Cough [ ] ; Wheezing[ ] ; Hemoptysis[ ] ; Sputum [ ] ; Snoring [ ]   GI: Vomiting[ ] ; Dysphagia[ ] ; Melena[ ] ; Hematochezia [ ] ; Heartburn[ ] ; Abdominal pain [ ] ; Constipation [ ] ; Diarrhea [ ] ; BRBPR [ ]   GU: Hematuria[ ] ; Dysuria [ ] ; Nocturia[ ]   Vascular: Pain in legs with walking [ ] ; Pain in feet with lying flat [ ] ; Non-healing sores [ ] ; Stroke [ ] ; TIA [ ] ; Slurred speech [ ] ;  Neuro: Headaches[ ] ; Vertigo[ ] ; Seizures[ ] ; Paresthesias[ ] ;Blurred vision [ ] ; Diplopia [ ] ; Vision changes [ ]   Ortho/Skin: Arthritis [ ] ; Joint pain [ ] ; Muscle pain [ ] ; Joint swelling [ ] ; Back Pain [ ] ; Rash [ ]   Psych: Depression[ ] ; Anxiety[ ]   Heme: Bleeding problems [ ] ; Clotting disorders [ ] ; Anemia [ ]   Endocrine: Diabetes [ ] ; Thyroid dysfunction[ ]    Past Medical History:  Diagnosis Date   Carpal tunnel syndrome of right wrist    Chronic combined systolic (congestive) and diastolic (congestive) heart failure (HCC)    a. 03/2017 Echo: EF 20-25%, diff HK, Gr2 DD; b. 09/2017 Echo: EF 20-25%, diff HK, Gr2 DD; c. 04/2020 Echo: EF 45-50%, mild LVH, antsept/basal ant HK, nl RV fxn; d. 11/2022 Echo: EF 25%, basal, mid & apical and antsept AK/DK.; e. 04/2023 Echo: EF 20-25%, mild conc LVH, GrI DD, nl RV fxn, small pericardial effusion.   Diabetes (HCC)    GERD (gastroesophageal reflux disease)    HLD (hyperlipidemia)    Hypertension    Lower leg DVT (deep  venous thrombosis) (HCC)    NICM (nonischemic cardiomyopathy) (HCC)    a. 03/2017 Echo: EF 20-25%; b. 03/2017 Cath: mild nonobs dzs, EF 25%; c. 09/2017 Echo: EF 20-25%; d. 04/2020 Echo: EF 45-50%; e. 11/2022 Echo: EF 25%; f. 11/2022 MV: small, mild, fixed antsept/infsept defects, likely 2/2 LBBB->low risk; g. 04/2023 Echo: EF 20-25%.   Pleural effusion, left    a. 03/2017 s/p thoracentesis-->300 ml withdrawn--transudative.   Pneumonia 02/19/2017   Pulmonary embolism (HCC)    Stroke [facility])     Current Outpatient Medications  Medication Sig Dispense Refill   albuterol (VENTOLIN HFA) 108 (90 Base) MCG/ACT inhaler Inhale 1-2 puffs into the lungs every 6 (six) hours as needed for wheezing or shortness of breath.     Blood Glucose Monitoring Suppl DEVI 1 each by Does not apply route in the morning, at noon, and at bedtime. May substitute to any manufacturer covered by patient's insurance. 1 each 3   furosemide (LASIX) 40 MG tablet Take 1 tablet (40 mg total) by mouth daily. 90 tablet 3   glipiZIDE (GLUCOTROL XL) 5 MG 24 hr tablet Take 1 tablet (5 mg total) by mouth daily with breakfast. 90 tablet 0   Glucose Blood (BLOOD GLUCOSE TEST STRIPS) STRP 1  each by In Vitro route in the morning, at noon, and at bedtime. May substitute to any manufacturer covered by patient's insurance. 90 each 0   hydrALAZINE (APRESOLINE) 25 MG tablet Take 1 tablet (25 mg total) by mouth 3 (three) times daily. 90 tablet 6   Lancet Device MISC 1 each by Does not apply route in the morning, at noon, and at bedtime. May substitute to any manufacturer covered by patient's insurance. 90 each 3   loperamide HCl (IMODIUM) 1 MG/7.5ML solution Take 2 mg by mouth 4 (four) times daily as needed for diarrhea or loose stools.     metoprolol succinate (TOPROL-XL) 25 MG 24 hr tablet Take 1 tablet (25 mg total) by mouth daily. 90 tablet 1   RIVAROXABAN (XARELTO) VTE STARTER PACK (15 & 20 MG) Follow package directions: Take one 15mg  tablet by  mouth twice a day. On day 22, switch to one 20mg  tablet once a day. Take with food. 51 each 0   rosuvastatin (CRESTOR) 20 MG tablet Take 1 tablet (20 mg total) by mouth daily. 90 tablet 1   sacubitril-valsartan (ENTRESTO) 97-103 MG Take 1 tablet by mouth 2 (two) times daily. 180 tablet 1   No current facility-administered medications for this visit.    No Known Allergies    Social History   Socioeconomic History   Marital status: Single    Spouse name: Not on file   Number of children: 3   Years of education: 12   Highest education level: 12th grade  Occupational History   Occupation: home maker  Tobacco Use   Smoking status: Never   Smokeless tobacco: Never  Vaping Use   Vaping status: Never Used  Substance and Sexual Activity   Alcohol use: No   Drug use: No   Sexual activity: Never  Other Topics Concern   Not on file  Social History Narrative   Not on file   Social Determinants of Health   Financial Resource Strain: Low Risk  (04/08/2023)   Overall Financial Resource Strain (CARDIA)    Difficulty of Paying Living Expenses: Not hard at all  Food Insecurity: No Food Insecurity (04/08/2023)   Hunger Vital Sign    Worried About Running Out of Food in the Last Year: Never true    Ran Out of Food in the Last Year: Never true  Transportation Needs: No Transportation Needs (04/08/2023)   PRAPARE - Administrator, Civil Service (Medical): No    Lack of Transportation (Non-Medical): No  Physical Activity: Unknown (12/07/2022)   Exercise Vital Sign    Days of Exercise per Week: 2 days    Minutes of Exercise per Session: Not on file  Stress: No Stress Concern Present (12/07/2022)   [employer] of Occupational Health - Occupational Stress Questionnaire    Feeling of Stress : Not at all  Social Connections: Moderately Isolated (12/07/2022)   Social Connection and Isolation Panel [NHANES]    Frequency of Communication with Friends and Family: More than three times  a week    Frequency of Social Gatherings with Friends and Family: More than three times a week    Attends Religious Services: More than 4 times per year    Active Member of [employer] or Organizations: No    Attends Banker Meetings: Never    Marital Status: Divorced  Catering manager Violence: Not At Risk (04/08/2023)   Humiliation, Afraid, Rape, and Kick questionnaire    Fear of Current or Ex-Partner: No  Emotionally Abused: No    Physically Abused: No    Sexually Abused: No      Family History  Problem Relation Age of Onset   Lung cancer Mother    Hypertension Father    CAD Father        a. MI age 22   Heart disease Father    COPD Neg Hx    Diabetes Mellitus II Neg Hx        PHYSICAL EXAM: General:  Well appearing. No respiratory difficulty HEENT: normal Neck: supple. no JVD. Carotids 2+ bilat; no bruits. No lymphadenopathy or thyromegaly appreciated. Cor: PMI nondisplaced. Regular rate & rhythm. No rubs, gallops or murmurs. Lungs: clear Abdomen: soft, nontender, nondistended. No hepatosplenomegaly. No bruits or masses. Good bowel sounds. Extremities: no cyanosis, clubbing, rash, edema Neuro: alert & oriented x 3, cranial nerves grossly intact. moves all 4 extremities w/o difficulty. Affect pleasant.  ECG:   ASSESSMENT & PLAN:     Delma Freeze, FNP 04/12/23

## 2023-04-12 NOTE — Telephone Encounter (Signed)
Reason for Disposition  [1] Systolic BP  >= 180 OR Diastolic >= 110 AND [2] missed most recent dose of blood pressure medication  Answer Assessment - Initial Assessment Questions 1. BLOOD PRESSURE: "What is the blood pressure?" "Did you take at least two measurements 5 minutes apart?"     161/118, 161/108 2. ONSET: "When did you take your blood pressure?"     10;42, 10:53 3. HOW: "How did you take your blood pressure?" (e.g., automatic home BP monitor, visiting nurse)     Automatic cuff 4. HISTORY: "Do you have a history of high blood pressure?"     Yes 5. MEDICINES: "Are you taking any medicines for blood pressure?" "Have you missed any doses recently?"     Medication- no medication today 6. OTHER SYMPTOMS: "Do you have any symptoms?" (e.g., blurred vision, chest pain, difficulty breathing, headache, weakness)     no  Protocols used: Blood Pressure - High-A-AH

## 2023-04-15 ENCOUNTER — Ambulatory Visit (HOSPITAL_BASED_OUTPATIENT_CLINIC_OR_DEPARTMENT_OTHER): Payer: 59 | Admitting: Family

## 2023-04-15 ENCOUNTER — Other Ambulatory Visit
Admission: RE | Admit: 2023-04-15 | Discharge: 2023-04-15 | Disposition: A | Discharge status: Home / Self Care | Payer: 59 | Source: Ambulatory Visit | Attending: Family | Admitting: Family

## 2023-04-15 ENCOUNTER — Ambulatory Visit (INDEPENDENT_AMBULATORY_CARE_PROVIDER_SITE_OTHER): Payer: 59 | Admitting: Internal Medicine

## 2023-04-15 ENCOUNTER — Telehealth: Payer: Self-pay

## 2023-04-15 ENCOUNTER — Encounter: Payer: Self-pay | Admitting: Family

## 2023-04-15 ENCOUNTER — Encounter: Payer: Self-pay | Admitting: Internal Medicine

## 2023-04-15 VITALS — BP 138/80 | Ht 59.0 in | Wt 123.0 lb

## 2023-04-15 VITALS — BP 156/90 | HR 88 | Ht 59.0 in | Wt 124.0 lb

## 2023-04-15 DIAGNOSIS — I10 Essential (primary) hypertension: Secondary | ICD-10-CM | POA: Diagnosis not present

## 2023-04-15 DIAGNOSIS — I5022 Chronic systolic (congestive) heart failure: Secondary | ICD-10-CM | POA: Diagnosis not present

## 2023-04-15 DIAGNOSIS — Z86718 Personal history of other venous thrombosis and embolism: Secondary | ICD-10-CM | POA: Diagnosis not present

## 2023-04-15 DIAGNOSIS — I2699 Other pulmonary embolism without acute cor pulmonale: Secondary | ICD-10-CM

## 2023-04-15 DIAGNOSIS — I509 Heart failure, unspecified: Secondary | ICD-10-CM | POA: Diagnosis present

## 2023-04-15 DIAGNOSIS — Z794 Long term (current) use of insulin: Secondary | ICD-10-CM

## 2023-04-15 DIAGNOSIS — E782 Mixed hyperlipidemia: Secondary | ICD-10-CM | POA: Diagnosis not present

## 2023-04-15 DIAGNOSIS — I639 Cerebral infarction, unspecified: Secondary | ICD-10-CM | POA: Diagnosis not present

## 2023-04-15 DIAGNOSIS — I160 Hypertensive urgency: Secondary | ICD-10-CM

## 2023-04-15 DIAGNOSIS — E119 Type 2 diabetes mellitus without complications: Secondary | ICD-10-CM

## 2023-04-15 DIAGNOSIS — I825Y9 Chronic embolism and thrombosis of unspecified deep veins of unspecified proximal lower extremity: Secondary | ICD-10-CM | POA: Diagnosis not present

## 2023-04-15 DIAGNOSIS — E1149 Type 2 diabetes mellitus with other diabetic neurological complication: Secondary | ICD-10-CM

## 2023-04-15 DIAGNOSIS — N179 Acute kidney failure, unspecified: Secondary | ICD-10-CM

## 2023-04-15 LAB — BASIC METABOLIC PANEL
Anion gap: 10 (ref 5–15)
BUN: 16 mg/dL (ref 6–20)
CO2: 21 mmol/L — ABNORMAL LOW (ref 22–32)
Calcium: 8.3 mg/dL — ABNORMAL LOW (ref 8.9–10.3)
Chloride: 106 mmol/L (ref 98–111)
Creatinine, Ser: 1 mg/dL (ref 0.44–1.00)
GFR, Estimated: >60 mL/min (ref >60)
Glucose, Bld: 191 mg/dL — ABNORMAL HIGH (ref 70–99)
Potassium: 4.2 mmol/L (ref 3.5–5.1)
Sodium: 137 mmol/L (ref 135–145)

## 2023-04-15 LAB — PROTHROMBIN GENE MUTATION

## 2023-04-15 LAB — FACTOR 5 LEIDEN

## 2023-04-15 NOTE — Progress Notes (Signed)
   Care Guide Note  04/15/2023 Name: Erin Hayes MRN: [id number] DOB: Jul 13, 1965  Referred by: Lorre Munroe, NP Reason for referral : Care Coordination (Outreach to schedule with Pharm d )   Erin Hayes is a 57 y.o. year old female who is a primary care patient of Lorre Munroe, NP. [name] was referred to the pharmacist for assistance related to DM.    Successful contact was made with the patient to discuss pharmacy services including being ready for the pharmacist to call at least 5 minutes before the scheduled appointment time, to have medication bottles and any blood sugar or blood pressure readings ready for review. The patient agreed to meet with the pharmacist via with the pharmacist via telephone visit on (date/time).  04/29/2023  Penne Lash , RMA     [place]  [facility], [facility] Guide  Direct Dial: [phone]  Website: [email or web address]

## 2023-04-15 NOTE — Patient Instructions (Signed)
Stroke Prevention Some medical conditions and lifestyle choices can lead to a higher risk for a stroke. You can help to prevent a stroke by eating healthy foods and exercising. It also helps to not smoke and to manage any health problems you may have. How can this condition affect me? A stroke is an emergency. It should be treated right away. A stroke can lead to brain damage or threaten your life. There is a better chance of surviving and getting better after a stroke if you get medical help right away. What can increase my risk? The following medical conditions may increase your risk of a stroke: Diseases of the heart and blood vessels (cardiovascular disease). High blood pressure (hypertension). Diabetes. High cholesterol. Sickle cell disease. Problems with blood clotting. Being very overweight. Sleeping problems (obstructivesleep apnea). Other risk factors include: Being older than age 43. A history of blood clots, stroke, or mini-stroke (TIA). Race, ethnic background, or a family history of stroke. Smoking or using tobacco products. Taking birth control pills, especially if you smoke. Heavy alcohol and drug use. Not being active. What actions can I take to prevent this? Manage your health conditions High cholesterol. Eat a healthy diet. If this is not enough to manage your cholesterol, you may need to take medicines. Take medicines as told by your doctor. High blood pressure. Try to keep your blood pressure below 130/80. If your blood pressure cannot be managed through a healthy diet and regular exercise, you may need to take medicines. Take medicines as told by your doctor. Ask your doctor if you should check your blood pressure at home. Have your blood pressure checked every year. Diabetes. Eat a healthy diet and get regular exercise. If your blood sugar (glucose) cannot be managed through diet and exercise, you may need to take medicines. Take medicines as told by your  doctor. Talk to your doctor about getting checked for sleeping problems. Signs of a problem can include: Snoring a lot. Feeling very tired. Make sure that you manage any other conditions you have. Nutrition  Follow instructions from your doctor about what to eat or drink. You may be told to: Eat and drink fewer calories each day. Limit how much salt (sodium) you use to 1,500 milligrams (mg) each day. Use only healthy fats for cooking, such as olive oil, canola oil, and sunflower oil. Eat healthy foods. To do this: Choose foods that are high in fiber. These include whole grains, and fresh fruits and vegetables. Eat at least 5 servings of fruits and vegetables a day. Try to fill one-half of your plate with fruits and vegetables at each meal. Choose low-fat (lean) proteins. These include low-fat cuts of meat, chicken without skin, fish, tofu, beans, and nuts. Eat low-fat dairy products. Avoid foods that: Are high in salt. Have saturated fat. Have trans fat. Have cholesterol. Are processed or pre-made. Count how many carbohydrates you eat and drink each day. Lifestyle If you drink alcohol: Limit how much you have to: 0-1 drink a day for women who are not pregnant. 0-2 drinks a day for men. Know how much alcohol is in your drink. In the [place] one drink equals one 12 oz bottle of beer ( ), one 5 oz glass of wine ( ), or one 1 oz glass of hard liquor (44mL). Do not smoke or use any products that have nicotine or tobacco. If you need help quitting, ask your doctor. Avoid secondhand smoke. Do not use drugs. Activity  Try to stay at a  healthy weight. Get at least 30 minutes of exercise on most days, such as: Fast walking. Biking. Swimming. Medicines Take over-the-counter and prescription medicines only as told by your doctor. Avoid taking birth control pills. Talk to your doctor about the risks of taking birth control pills if: You are over 94 years old. You smoke. You get  very bad headaches. You have had a blood clot. Where to find more information American Stroke Association: www.strokeassociation.org Get help right away if: You or a loved one has any signs of a stroke. "BE FAST" is an easy way to remember the warning signs: B - Balance. Dizziness, sudden trouble walking, or loss of balance. E - Eyes. Trouble seeing or a change in how you see. F - Face. Sudden weakness or loss of feeling of the face. The face or eyelid may droop on one side. A - Arms. Weakness or loss of feeling in an arm. This happens all of a sudden and most often on one side of the body. S - Speech. Sudden trouble speaking, slurred speech, or trouble understanding what people say. T - Time. Time to call emergency services. Write down what time symptoms started. You or a loved one has other signs of a stroke, such as: A sudden, very bad headache with no known cause. Feeling like you may vomit (nausea). Vomiting. A seizure. These symptoms may be an emergency. Get help right away. Call your local emergency services (911 in the U.S[place] Do not wait to see if the symptoms will go away. Do not drive yourself to the hospital. Summary You can help to prevent a stroke by eating healthy, exercising, and not smoking. It also helps to manage any health problems you have. Do not smoke or use any products that contain nicotine or tobacco. Get help right away if you or a loved one has any signs of a stroke. This information is not intended to replace advice given to you by your health care provider. Make sure you discuss any questions you have with your health care provider. Document Revised: 03/26/2022 Document Reviewed: 03/26/2022 Elsevier Patient Education  2024 [employer].

## 2023-04-15 NOTE — Patient Instructions (Signed)
 Go over to the [facility]. Go pass the gift shop and have your blood work completed.  We will only call you if the results are abnormal or if the provider would like to make medication changes.

## 2023-04-15 NOTE — Progress Notes (Signed)
Subjective:    Patient ID: Erin Hayes, female    DOB: 07-12-1965, 57 y.o.   MRN: [id number]  HPI  Patient presents to clinic today for hospital follow-up.  She presented to the ER 11/30 with complaint of altered mental status. MRI of the brain showed small acute cortical infarct within the left insular, CT angiogram of the neck and head showed acute distal left M4 occlusion. CT angiogram of chest showed severe right sided pulmonary embolism with extension to multiple upper lobes branches.  Duplex ultrasound showed extensive left lower extremity DVT.  Echo showed an EF of 20 to 25%.  Neurology was consulted.  She was started on a heparin drip.  Vascular was also consulted but did not recommend any mechanical intervention.  Hematology was consulted as well.  They did not feel like she failed her eliquis therapy because she had been noncompliant however they did switch it to xarelto. Since that time, she reports she is back to baseline. She has not had any confusion, headaches, blurred vision. She does feel like she has difficulty with speech at times.  She denies any pain or shortness of breath.  She denies unilateral weakness.  Her gait is still impaired by wearing a boot to the left lower extremity but she continues to follow with podiatry/orthopedics with this.  She reports she has a follow-up appointment with neurology today.  She did state that she does not know which medications she is supposed to take and which when she is not.  Review of Systems   Past Medical History:  Diagnosis Date   Carpal tunnel syndrome of right wrist    Chronic combined systolic (congestive) and diastolic (congestive) heart failure (HCC)    a. 03/2017 Echo: EF 20-25%, diff HK, Gr2 DD; b. 09/2017 Echo: EF 20-25%, diff HK, Gr2 DD; c. 04/2020 Echo: EF 45-50%, mild LVH, antsept/basal ant HK, nl RV fxn; d. 11/2022 Echo: EF 25%, basal, mid & apical and antsept AK/DK.; e. 04/2023 Echo: EF 20-25%, mild conc LVH, GrI DD, nl RV  fxn, small pericardial effusion.   Diabetes (HCC)    GERD (gastroesophageal reflux disease)    HLD (hyperlipidemia)    Hypertension    Lower leg DVT (deep venous thrombosis) (HCC)    NICM (nonischemic cardiomyopathy) (HCC)    a. 03/2017 Echo: EF 20-25%; b. 03/2017 Cath: mild nonobs dzs, EF 25%; c. 09/2017 Echo: EF 20-25%; d. 04/2020 Echo: EF 45-50%; e. 11/2022 Echo: EF 25%; f. 11/2022 MV: small, mild, fixed antsept/infsept defects, likely 2/2 LBBB->low risk; g. 04/2023 Echo: EF 20-25%.   Pleural effusion, left    a. 03/2017 s/p thoracentesis-->300 ml withdrawn--transudative.   Pneumonia 02/19/2017   Pulmonary embolism (HCC)    Stroke [facility])     Current Outpatient Medications  Medication Sig Dispense Refill   albuterol (VENTOLIN HFA) 108 (90 Base) MCG/ACT inhaler Inhale 1-2 puffs into the lungs every 6 (six) hours as needed for wheezing or shortness of breath.     Blood Glucose Monitoring Suppl DEVI 1 each by Does not apply route in the morning, at noon, and at bedtime. May substitute to any manufacturer covered by patient's insurance. 1 each 3   furosemide (LASIX) 40 MG tablet Take 1 tablet (40 mg total) by mouth daily. 90 tablet 3   glipiZIDE (GLUCOTROL XL) 5 MG 24 hr tablet Take 1 tablet (5 mg total) by mouth daily with breakfast. 90 tablet 0   Glucose Blood (BLOOD GLUCOSE TEST STRIPS) STRP 1 each  by In Vitro route in the morning, at noon, and at bedtime. May substitute to any manufacturer covered by patient's insurance. 90 each 0   hydrALAZINE (APRESOLINE) 25 MG tablet Take 1 tablet (25 mg total) by mouth 3 (three) times daily. 90 tablet 6   Lancet Device MISC 1 each by Does not apply route in the morning, at noon, and at bedtime. May substitute to any manufacturer covered by patient's insurance. 90 each 3   loperamide HCl (IMODIUM) 1 MG/7.5ML solution Take 2 mg by mouth 4 (four) times daily as needed for diarrhea or loose stools.     metoprolol succinate (TOPROL-XL) 25 MG 24 hr tablet Take 1  tablet (25 mg total) by mouth daily. 90 tablet 1   RIVAROXABAN (XARELTO) VTE STARTER PACK (15 & 20 MG) Follow package directions: Take one 15mg  tablet by mouth twice a day. On day 22, switch to one 20mg  tablet once a day. Take with food. 51 each 0   rosuvastatin (CRESTOR) 20 MG tablet Take 1 tablet (20 mg total) by mouth daily. 90 tablet 1   sacubitril-valsartan (ENTRESTO) 97-103 MG Take 1 tablet by mouth 2 (two) times daily. 180 tablet 1   No current facility-administered medications for this visit.    No Known Allergies  Family History  Problem Relation Age of Onset   Lung cancer Mother    Hypertension Father    CAD Father        a. MI age 60   Heart disease Father    COPD Neg Hx    Diabetes Mellitus II Neg Hx     Social History   Socioeconomic History   Marital status: Single    Spouse name: Not on file   Number of children: 3   Years of education: 12   Highest education level: 12th grade  Occupational History   Occupation: Arts development officer  Tobacco Use   Smoking status: Never   Smokeless tobacco: Never  Vaping Use   Vaping status: Never Used  Substance and Sexual Activity   Alcohol use: No   Drug use: No   Sexual activity: Never  Other Topics Concern   Not on file  Social History Narrative   Not on file   Social Determinants of Health   Financial Resource Strain: Low Risk  (04/08/2023)   Overall Financial Resource Strain (CARDIA)    Difficulty of Paying Living Expenses: Not hard at all  Food Insecurity: No Food Insecurity (04/08/2023)   Hunger Vital Sign    Worried About Running Out of Food in the Last Year: Never true    Ran Out of Food in the Last Year: Never true  Transportation Needs: No Transportation Needs (04/08/2023)   PRAPARE - Administrator, Civil Service (Medical): No    Lack of Transportation (Non-Medical): No  Physical Activity: Unknown (12/07/2022)   Exercise Vital Sign    Days of Exercise per Week: 2 days    Minutes of Exercise per  Session: Not on file  Stress: No Stress Concern Present (12/07/2022)   [employer] of Occupational Health - Occupational Stress Questionnaire    Feeling of Stress : Not at all  Social Connections: Moderately Isolated (12/07/2022)   Social Connection and Isolation Panel [NHANES]    Frequency of Communication with Friends and Family: More than three times a week    Frequency of Social Gatherings with Friends and Family: More than three times a week    Attends Religious Services: More than  4 times per year    Active Member of Clubs or Organizations: No    Attends Banker Meetings: Never    Marital Status: Divorced  Catering manager Violence: Not At Risk (04/08/2023)   Humiliation, Afraid, Rape, and Kick questionnaire    Fear of Current or Ex-Partner: No    Emotionally Abused: No    Physically Abused: No    Sexually Abused: No     Constitutional: Denies fever, malaise, fatigue, headache or abrupt weight changes.  HEENT: Denies eye pain, eye redness, ear pain, ringing in the ears, wax buildup, runny nose, nasal congestion, bloody nose, or sore throat. Respiratory: Denies difficulty breathing, shortness of breath, cough or sputum production.   Cardiovascular: Denies chest pain, chest tightness, palpitations or swelling in the hands or feet.  Gastrointestinal: Denies abdominal pain, bloating, constipation, diarrhea or blood in the stool.  GU: Denies urgency, frequency, pain with urination, burning sensation, blood in urine, odor or discharge. Musculoskeletal: Patient reports difficulty with gait.  Denies decrease in range of motion, muscle pain or joint pain and swelling.  Skin: Denies redness, rashes, lesions or ulcercations.  Neurological: Patient reports difficulty with speech.  Denies dizziness, difficulty with memory, or problems with balance and coordination.  Psych: Denies anxiety, depression, SI/HI.  No other specific complaints in a complete review of systems (except  as listed in HPI above).      Objective:   Physical Exam   BP 138/80   Ht 4\' 11"  (1.499 m)   Wt 123 lb (55.8 kg)   BMI 24.84 kg/m   Wt Readings from Last 3 Encounters:  04/07/23 121 lb 7.6 oz (55.1 kg)  03/19/23 120 lb (54.4 kg)  07/10/22 119 lb (54 kg)    General: Appears her stated age, well developed, well nourished in NAD. Skin: Warm, dry and intact. No  ulcerations noted. HEENT: Head: normal shape and size; Eyes: sclera white, no icterus, conjunctiva pink, PERRLA and EOMs intact;  Cardiovascular: Normal rate and rhythm. S1,S2 noted.  No murmur, rubs or gallops noted. No JVD or BLE edema. No carotid bruits noted. Pulmonary/Chest: Normal effort and positive vesicular breath sounds. No respiratory distress. No wheezes, rales or ronchi noted.  Musculoskeletal: Boot to LLE.  In wheelchair today. Neurological: Alert and oriented. Coordination normal.    BMET    Component Value Date/Time   NA 136 04/09/2023 0301   NA 142 06/19/2018 1905   NA 129 (L) 04/25/2013 2004   K 4.0 04/09/2023 0301   K 5.7 (H) 04/25/2013 2004   CL 106 04/09/2023 0301   CL 97 (L) 04/25/2013 2004   CO2 21 (L) 04/09/2023 0301   CO2 12 (L) 04/25/2013 2004   GLUCOSE 203 (H) 04/09/2023 0301   GLUCOSE 370 (H) 04/25/2013 2004   BUN 29 (H) 04/09/2023 0301   BUN 14 06/19/2018 1905   BUN 17 04/25/2013 2004   CREATININE 1.27 (H) 04/09/2023 0301   CREATININE 0.81 01/31/2023 1126   CALCIUM 7.6 (L) 04/09/2023 0301   CALCIUM 9.7 04/25/2013 2004   GFRNONAA 49 (L) 04/09/2023 0301   GFRNONAA 66 03/28/2020 1044   GFRAA 77 03/28/2020 1044    Lipid Panel     Component Value Date/Time   CHOL 217 (H) 04/07/2023 0447   CHOL 216 (H) 06/19/2018 1905   TRIG 106 04/07/2023 0447   HDL 65 04/07/2023 0447   HDL 53 06/19/2018 1905   CHOLHDL 3.3 04/07/2023 0447   VLDL 21 04/07/2023 0447   LDLCALC  131 (H) 04/07/2023 0447   LDLCALC 79 01/31/2023 1126    CBC    Component Value Date/Time   WBC 9.2 04/09/2023  0301   RBC 4.08 04/09/2023 0301   HGB 11.3 (L) 04/09/2023 0301   HGB 11.6 06/19/2018 1905   HCT 34.8 (L) 04/09/2023 0301   HCT 35.4 06/19/2018 1905   PLT 303 04/09/2023 0301   PLT 435 06/19/2018 1905   MCV 85.3 04/09/2023 0301   MCV 85 06/19/2018 1905   MCV 88 04/25/2013 2004   MCH 27.7 04/09/2023 0301   MCHC 32.5 04/09/2023 0301   RDW 13.9 04/09/2023 0301   RDW 12.4 06/19/2018 1905   RDW 12.3 04/25/2013 2004   LYMPHSABS 2.0 04/25/2022 1608   MONOABS 0.5 04/25/2022 1608   EOSABS 0.1 04/25/2022 1608   BASOSABS 0.1 04/25/2022 1608    Hgb A1C Lab Results  Component Value Date   HGBA1C 7.8 (H) 01/31/2023            Assessment & Plan:  Hospital follow-up for acute PE, recurrent DVT, acute ischemic stroke, acute kidney injury, hypertensive urgency, DM 2:  Hospital notes, labs and imaging reviewed Medications reviewed with patient, list provided. Discussed the importance of medication compliance. Discussed the importance of good blood pressure, blood sugar and cholesterol control Referral to care coordinators for assistance with medication adherence and compliance She will follow-up with neurology and hematology as previously scheduled  RTC in 1 month for follow-up of chronic conditions Nicki Reaper, NP

## 2023-04-29 ENCOUNTER — Telehealth: Payer: Self-pay | Admitting: Pharmacist

## 2023-04-29 ENCOUNTER — Other Ambulatory Visit: Payer: 59

## 2023-04-29 NOTE — Telephone Encounter (Signed)
   Outreach Note  04/29/2023 Name: COLLIN GOMES MRN: [id number] DOB: 14-Mar-1966  Referred by: Lorre Munroe, NP Reason for referral : Medication Adherence   Was unable to reach patient via telephone today and have left HIPAA compliant message asking patient to return my call.    Follow Up Plan: Will collaborate with Care Guide to outreach to schedule follow up with me  Estelle Grumbles, PharmD, [facility] Clinical Pharmacist [facility] [phone]

## 2023-05-02 ENCOUNTER — Telehealth: Payer: Self-pay

## 2023-05-02 NOTE — Progress Notes (Unsigned)
Complex Care Management Care Guide Note  05/02/2023 Name: Erin Hayes MRN: [id number] DOB: 07-29-1965  Erin Hayes is a 57 y.o. year old female who is a primary care patient of Lorre Munroe, NP and is actively engaged with the care management team. I reached out to Erin Hayes by phone today to assist with re-scheduling  with the Pharmacist.  Follow up plan: Unsuccessful telephone outreach attempt made. A HIPAA compliant phone message was left for the patient providing contact information and requesting a return call.  Penne Lash , RMA     [facility]  [facility], [facility] Guide  Direct Dial: [phone]  Website: [email or web address]

## 2023-05-09 ENCOUNTER — Other Ambulatory Visit: Payer: Self-pay | Admitting: Internal Medicine

## 2023-05-09 NOTE — Telephone Encounter (Signed)
 Medication Refill -  Most Recent Primary Care Visit:  Provider: ANTONETTE ANGELINE ORN  Department: [facility]  Visit Type: OFFICE VISIT  Date: 04/15/2023  Medication: RIVAROXABAN  (XARELTO )   Has the patient contacted their pharmacy? No  Is this the correct pharmacy for this prescription? Yes If no, delete pharmacy and type the correct one.  This is the patient's preferred pharmacy: [facility] - [place], [place] - [address] [place] [address] Phone: [phone] Fax: [phone]  Has the prescription been filled recently? No  Is the patient out of the medication? No  Has the patient been seen for an appointment in the last year OR does the patient have an upcoming appointment? Yes  Can we respond through MyChart? Yes  Agent: Please be advised that Rx refills may take up to 3 business days. We ask that you follow-up with your pharmacy.

## 2023-05-13 MED ORDER — RIVAROXABAN 20 MG PO TABS: 20.0000 mg | ORAL_TABLET | Freq: Every day | ORAL | 0 refills | Status: DC
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
## 2023-05-14 ENCOUNTER — Encounter: Payer: Self-pay | Admitting: Internal Medicine

## 2023-05-14 ENCOUNTER — Ambulatory Visit (INDEPENDENT_AMBULATORY_CARE_PROVIDER_SITE_OTHER): Payer: 59 | Admitting: Internal Medicine

## 2023-05-14 VITALS — BP 136/80 | Ht 59.0 in | Wt 117.0 lb

## 2023-05-14 DIAGNOSIS — I10 Essential (primary) hypertension: Secondary | ICD-10-CM

## 2023-05-14 DIAGNOSIS — Z794 Long term (current) use of insulin: Secondary | ICD-10-CM

## 2023-05-14 DIAGNOSIS — E1149 Type 2 diabetes mellitus with other diabetic neurological complication: Secondary | ICD-10-CM | POA: Diagnosis not present

## 2023-05-14 DIAGNOSIS — Z8673 Personal history of transient ischemic attack (TIA), and cerebral infarction without residual deficits: Secondary | ICD-10-CM | POA: Insufficient documentation

## 2023-05-14 DIAGNOSIS — D500 Iron deficiency anemia secondary to blood loss (chronic): Secondary | ICD-10-CM

## 2023-05-14 DIAGNOSIS — Z86718 Personal history of other venous thrombosis and embolism: Secondary | ICD-10-CM

## 2023-05-14 DIAGNOSIS — I5022 Chronic systolic (congestive) heart failure: Secondary | ICD-10-CM | POA: Diagnosis not present

## 2023-05-14 DIAGNOSIS — I428 Other cardiomyopathies: Secondary | ICD-10-CM | POA: Diagnosis not present

## 2023-05-14 DIAGNOSIS — K529 Noninfective gastroenteritis and colitis, unspecified: Secondary | ICD-10-CM | POA: Insufficient documentation

## 2023-05-14 DIAGNOSIS — E785 Hyperlipidemia, unspecified: Secondary | ICD-10-CM

## 2023-05-14 DIAGNOSIS — E1169 Type 2 diabetes mellitus with other specified complication: Secondary | ICD-10-CM

## 2023-05-14 LAB — POCT GLYCOSYLATED HEMOGLOBIN (HGB A1C): Hemoglobin A1C: 10.4 % — AB (ref 4.0–5.6)

## 2023-05-14 MED ORDER — GLIPIZIDE ER 10 MG PO TB24: 10.0000 mg | ORAL_TABLET | Freq: Every day | ORAL | 0 refills | Status: DC

## 2023-05-14 NOTE — Assessment & Plan Note (Signed)
 Continue metoprolol, furosemide, entresto and hydralazine Encourage low-salt diet She will continue to follow with cardiology

## 2023-05-14 NOTE — Progress Notes (Signed)
 Subjective:    Patient ID: Erin Hayes, female    DOB: 10-10-1965, 58 y.o.   MRN: [id number]  HPI  Patient presents to clinic today for follow-up of chronic conditions.  HTN with NICM: Her BP today is 128/88.  She is taking metoprolol , furosemide , entresto  and hydralzine as prescribed.  ECG from 04/2023 reviewed.  HLD with stroke: Her last LDL was 131, triglycerides 106, 04/2023.  She denies myalgias on rosuvastatin  and xarelto .  She does not consume a low-fat diet. She does not follow with neurology  CHF: She denies chronic cough, shortness of breath or  lower extremity edema.  She is taking metoprolol , furosemide , entresto  and hydralazine  as prescribed.  Echo from 04/2023 reviewed.  DM2: Her last A1c was 7.8%, 09/2020.  She does not check her sugars.  She is taking glipizide .  She does not check her feet routinely.  Her last eye exam was in 2023, [facility].  Flu 03/2020.  Pneumovax 09/2020.  COVID Moderna x2.  Recurrent DVT: Managed with xarelto . She follows with vascular.   Iron deficiency anemia: Her last H/H was 11.3/34.8, 04/2023. She is not taking any oral iron at this time. She does not follow with hematology.  Chronic diarrhea: Managed with Imodium .  She does not follow with GI.  Review of Systems     Past Medical History:  Diagnosis Date   Carpal tunnel syndrome of right wrist    Chronic combined systolic (congestive) and diastolic (congestive) heart failure (HCC)    a. 03/2017 Echo: EF 20-25%, diff HK, Gr2 DD; b. 09/2017 Echo: EF 20-25%, diff HK, Gr2 DD; c. 04/2020 Echo: EF 45-50%, mild LVH, antsept/basal ant HK, nl RV fxn; d. 11/2022 Echo: EF 25%, basal, mid & apical and antsept AK/DK.; e. 04/2023 Echo: EF 20-25%, mild conc LVH, GrI DD, nl RV fxn, small pericardial effusion.   Diabetes (HCC)    GERD (gastroesophageal reflux disease)    HLD (hyperlipidemia)    Hypertension    Lower leg DVT (deep venous thrombosis) (HCC)    NICM (nonischemic cardiomyopathy) (HCC)     a. 03/2017 Echo: EF 20-25%; b. 03/2017 Cath: mild nonobs dzs, EF 25%; c. 09/2017 Echo: EF 20-25%; d. 04/2020 Echo: EF 45-50%; e. 11/2022 Echo: EF 25%; f. 11/2022 MV: small, mild, fixed antsept/infsept defects, likely 2/2 LBBB->low risk; g. 04/2023 Echo: EF 20-25%.   Pleural effusion, left    a. 03/2017 s/p thoracentesis-->300 ml withdrawn--transudative.   Pneumonia 02/19/2017   Pulmonary embolism (HCC)    Stroke [facility])     Current Outpatient Medications  Medication Sig Dispense Refill   albuterol  (VENTOLIN  HFA) 108 (90 Base) MCG/ACT inhaler Inhale 1-2 puffs into the lungs every 6 (six) hours as needed for wheezing or shortness of breath.     Blood Glucose Monitoring Suppl DEVI 1 each by Does not apply route in the morning, at noon, and at bedtime. May substitute to any manufacturer covered by patient's insurance. 1 each 3   furosemide  (LASIX ) 40 MG tablet Take 1 tablet (40 mg total) by mouth daily. 90 tablet 3   glipiZIDE  (GLUCOTROL  XL) 5 MG 24 hr tablet Take 1 tablet (5 mg total) by mouth daily with breakfast. 90 tablet 0   hydrALAZINE  (APRESOLINE ) 25 MG tablet Take 1 tablet (25 mg total) by mouth 3 (three) times daily. 90 tablet 6   Lancets (ONETOUCH DELICA PLUS LANCET30G) MISC      loperamide  HCl (IMODIUM ) 1 MG/7.5ML solution Take 2 mg by mouth 4 (four)  times daily as needed for diarrhea or loose stools.     metoprolol  succinate (TOPROL -XL) 25 MG 24 hr tablet Take 1 tablet (25 mg total) by mouth daily. 90 tablet 1   rivaroxaban  (XARELTO ) 20 MG TABS tablet Take 1 tablet (20 mg total) by mouth daily with supper. 90 tablet 0   rosuvastatin  (CRESTOR ) 20 MG tablet Take 1 tablet (20 mg total) by mouth daily. 90 tablet 1   sacubitril -valsartan  (ENTRESTO ) 97-103 MG Take 1 tablet by mouth 2 (two) times daily. 180 tablet 1   No current facility-administered medications for this visit.    No Known Allergies  Family History  Problem Relation Age of Onset   Lung cancer Mother    Hypertension Father     CAD Father        a. MI age 62   Heart disease Father    COPD Neg Hx    Diabetes Mellitus II Neg Hx     Social History   Socioeconomic History   Marital status: Single    Spouse name: Not on file   Number of children: 3   Years of education: 12   Highest education level: 12th grade  Occupational History   Occupation: arts development officer  Tobacco Use   Smoking status: Never   Smokeless tobacco: Never  Vaping Use   Vaping status: Never Used  Substance and Sexual Activity   Alcohol use: No   Drug use: No   Sexual activity: Never  Other Topics Concern   Not on file  Social History Narrative   Not on file   Social Drivers of Health   Financial Resource Strain: Low Risk  (04/08/2023)   Overall Financial Resource Strain (CARDIA)    Difficulty of Paying Living Expenses: Not hard at all  Food Insecurity: No Food Insecurity (04/08/2023)   Hunger Vital Sign    Worried About Running Out of Food in the Last Year: Never true    Ran Out of Food in the Last Year: Never true  Transportation Needs: No Transportation Needs (04/08/2023)   PRAPARE - Administrator, Civil Service (Medical): No    Lack of Transportation (Non-Medical): No  Physical Activity: Unknown (12/07/2022)   Exercise Vital Sign    Days of Exercise per Week: 2 days    Minutes of Exercise per Session: Not on file  Stress: No Stress Concern Present (12/07/2022)   [employer] of Occupational Health - Occupational Stress Questionnaire    Feeling of Stress : Not at all  Social Connections: Moderately Isolated (12/07/2022)   Social Connection and Isolation Panel [NHANES]    Frequency of Communication with Friends and Family: More than three times a week    Frequency of Social Gatherings with Friends and Family: More than three times a week    Attends Religious Services: More than 4 times per year    Active Member of [employer] or Organizations: No    Attends Banker Meetings: Never    Marital Status:  Divorced  Catering Manager Violence: Not At Risk (04/08/2023)   Humiliation, Afraid, Rape, and Kick questionnaire    Fear of Current or Ex-Partner: No    Emotionally Abused: No    Physically Abused: No    Sexually Abused: No     Constitutional: Denies fever, malaise, fatigue, headache or abrupt weight changes.  Respiratory: Denies difficulty breathing, shortness of breath, cough or sputum production.   Cardiovascular:  Denies chest pain, chest tightness, palpitations or swelling in  the hands or feet.  GI: Patient reports diarrhea.  Denies abdominal pain, bloating, constipation or blood in stool. Musculoskeletal: Patient reports left hip pain, difficulty with gait.  Denies muscle pain or joint  swelling.  Skin: Denies redness, rashes, lesions or ulcercations.  Neurological: Patient reports persistent swelling in left ankle, difficulty with gait..  Denies dizziness, difficulty with memory, difficulty with speech or problems with coordination.    No other specific complaints in a complete review of systems (except as listed in HPI above).  Objective:   Physical Exam BP (!) 142/88 (BP Location: Left Arm, Patient Position: Sitting, Cuff Size: Normal)   Ht 4' 11 (1.499 m)   Wt 117 lb (53.1 kg)   BMI 23.63 kg/m   Wt Readings from Last 3 Encounters:  05/14/23 117 lb (53.1 kg)  04/15/23 124 lb (56.2 kg)  04/15/23 123 lb (55.8 kg)    General: Appears her stated age, chronically ill-appearing, in NAD. Skin: Warm, dry and intact. No ulcerations noted. HEENT: Head: normal shape and size; Eyes: sclera white, no icterus, conjunctiva pink, PERRLA and EOMs intact;  Cardiovascular: Normal rate and rhythm. S1,S2 noted.  No murmur, rubs or gallops noted.  No JVD or BLE edema. No carotid bruits noted. Pulmonary/Chest: Normal effort and positive vesicular breath sounds. No respiratory distress. No wheezes, rales or ronchi noted.  Abdomen: Soft and nontender.  Active bowel sounds. Musculoskeletal:  In wheelchair.  Trace edema noted of the left ankle and foot. Neurological: Alert and oriented.     BMET    Component Value Date/Time   NA 137 04/15/2023 1600   NA 142 06/19/2018 1905   NA 129 (L) 04/25/2013 2004   K 4.2 04/15/2023 1600   K 5.7 (H) 04/25/2013 2004   CL 106 04/15/2023 1600   CL 97 (L) 04/25/2013 2004   CO2 21 (L) 04/15/2023 1600   CO2 12 (L) 04/25/2013 2004   GLUCOSE 191 (H) 04/15/2023 1600   GLUCOSE 370 (H) 04/25/2013 2004   BUN 16 04/15/2023 1600   BUN 14 06/19/2018 1905   BUN 17 04/25/2013 2004   CREATININE 1.00 04/15/2023 1600   CREATININE 0.81 01/31/2023 1126   CALCIUM  8.3 (L) 04/15/2023 1600   CALCIUM  9.7 04/25/2013 2004   GFRNONAA >60 04/15/2023 1600   GFRNONAA 66 03/28/2020 1044   GFRAA 77 03/28/2020 1044    Lipid Panel     Component Value Date/Time   CHOL 217 (H) 04/07/2023 0447   CHOL 216 (H) 06/19/2018 1905   TRIG 106 04/07/2023 0447   HDL 65 04/07/2023 0447   HDL 53 06/19/2018 1905   CHOLHDL 3.3 04/07/2023 0447   VLDL 21 04/07/2023 0447   LDLCALC 131 (H) 04/07/2023 0447   LDLCALC 79 01/31/2023 1126    CBC    Component Value Date/Time   WBC 9.2 04/09/2023 0301   RBC 4.08 04/09/2023 0301   HGB 11.3 (L) 04/09/2023 0301   HGB 11.6 06/19/2018 1905   HCT 34.8 (L) 04/09/2023 0301   HCT 35.4 06/19/2018 1905   PLT 303 04/09/2023 0301   PLT 435 06/19/2018 1905   MCV 85.3 04/09/2023 0301   MCV 85 06/19/2018 1905   MCV 88 04/25/2013 2004   MCH 27.7 04/09/2023 0301   MCHC 32.5 04/09/2023 0301   RDW 13.9 04/09/2023 0301   RDW 12.4 06/19/2018 1905   RDW 12.3 04/25/2013 2004   LYMPHSABS 2.0 04/25/2022 1608   MONOABS 0.5 04/25/2022 1608   EOSABS 0.1 04/25/2022 1608  BASOSABS 0.1 04/25/2022 1608    Hgb A1C Lab Results  Component Value Date   HGBA1C 7.8 (H) 01/31/2023            Assessment & Plan:    RTC in 3 months, follow-up chronic conditions Angeline Laura, NP

## 2023-05-14 NOTE — Assessment & Plan Note (Signed)
 Lipid profile reviewed Continue rosuvastatin

## 2023-05-14 NOTE — Assessment & Plan Note (Signed)
 No residual effect Continue metoprolol, furosemide, Entresto, hydralazine, rosuvastatin and Xarelto

## 2023-05-14 NOTE — Assessment & Plan Note (Signed)
 Initially elevated but manual repeat improved Continue with type, furosemide, entresto and hydralazine Encourage low-salt diet

## 2023-05-14 NOTE — Patient Instructions (Signed)

## 2023-05-14 NOTE — Assessment & Plan Note (Signed)
 Currently on Xarelto She will continue to follow with vascular

## 2023-05-14 NOTE — Assessment & Plan Note (Signed)
 POCT A1c 10.4% Encouraged her to consume a low-carb diet and exercise weight loss Increase glipizide  to 10 mg XR daily We will request copy of eye exam Encourage routine foot exam She declined flu shot Pneumovax UTD Encouraged her to get a COVID booster

## 2023-05-14 NOTE — Assessment & Plan Note (Signed)
 CBC reviewed

## 2023-05-14 NOTE — Assessment & Plan Note (Addendum)
 Continue metoprolol, furosemide, entresto and hydralazine Encourage low-salt diet Monitor weights She will continue to follow with cardiology

## 2023-05-14 NOTE — Assessment & Plan Note (Signed)
Continue Imodium.

## 2023-05-15 NOTE — Progress Notes (Signed)
 Complex Care Management Care Guide Note  05/15/2023 Name: [name] MRN: [id number] DOB: [birth year]  Erin Hayes is a 58 y.o. year old female who is a primary care patient of Antonette Angeline ORN, NP and is actively engaged with the care management team. I reached out to Erin Hayes by phone today to assist with re-scheduling  with the Pharmacist.  Follow up plan: Telephone appointment with complex care management team member scheduled for:  06/10/2023  Jeoffrey Buffalo , RMA     [place]  [facility] Guide  Direct Dial: [phone]  Website: [email or web address]

## 2023-05-20 NOTE — Progress Notes (Deleted)
 Advanced Heart Failure Clinic Note   PCP: Lorre Munroe, NP (last seen 01/25) Cardiologist: Julien Nordmann, MD (last seen 11/24)  Chief Complaint:  HPI:  Ms Newbold is a 58 y/o female with a history of stroke, HTN, hypokalemia, T1DM, CKD, hyperlipidemia, DVT/ PE (2023) and chronic heart failure.   Admitted 11/28/22 due to extensive DVT and worsened HFrEF possibly due to old anterior infarct. Reports stress fracture diagnosed 8 weeks ago and relative immobilization. CT AP (11/28/22) with partially occlusive thrombus extending from the bilateral common femoral veins, iliac vein, and infrahepatic IVC. CTA PE (7/25) with no pulmonary embolism. Started on heparin gtt. Transitioned to Eliquis on 7/28. Hematology consulted and recommended monoclonal gammopathy workup and outpatient hypercoagulability workup. She will need lifelong anticoagulation. 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 Creatinine 2.43 up from 0.9 likely due to contrast nephropathy as she received contrasted studies 2 days prior. UA notable for high specific gravity and ketonuria. PVL renal artery duplex and veins showed no evidence of hemodynamically significant stenosis in the renal arteries. The resistive indices are also within normal limits so the IVC DVT extending into the renal vein is not contributing to the AKI per discussion with vascular surgery.   Admitted 04/06/23 due to acute onset of AMS that lasted about an hour. MRI of the brain showed small acute cortical infarct within the left insular, CT angiogram of the neck and head showed acute distal left M4 occlusion. CT angiogram of chest showed severe right sided pulmonary embolism with extension to multiple upper  lobes branches.  Duplex ultrasound showed extensive left lower extremity DVT. Patient has been seen by teleneurology, started on heparin drip. Patient is seen by vascular surgery, no need for intervention. She was also seen by hematology, she did not fail Eliquis treatment, she was not compliant. At this point, treatment will be changed to Xarelto. Echocardiogram has been performed, ejection fraction 20 to 25% without a PFO.   Echo 04/04/17: EF 20-25% with moderate LVH, Grade II DD, mild/ moderate MR Echo 09/25/17: EF 20-25% with Grade II DD, mild MR Echo 05/21/19: EF 45-50% with Grade I DD Echo 11/29/22: EF 25% with Grade I DD, no thrombus  Stress test 12/03/22: EF >60%, small, mild in severity, fixed perfusion defect involving the mid anteroseptal and mid inferoseptal segments, small to moderate (mostly posteriorly 1.4 cm) pericardial effusion  She presents today for a HF follow-up visit with a chief complaint of   Previous cardiac studies:  LHC 04/05/17: There is severe left ventricular systolic dysfunction. LV end diastolic pressure is severely elevated. The left ventricular ejection fraction is less than 25% by visual estimate.  1.  Mild nonobstructive coronary artery disease. 2.  Severely reduced LV systolic function with an ejection fraction of 15-20% with global hypokinesis. 3.  Severely elevated left ventricular end-diastolic pressure at 33 mmHg.  Chest CTA 11/29/22: No PE, small/ moderate pleural effusion     ROS: All systems negative except as listed in HPI, PMH and Problem List.  SH:  Social History   Socioeconomic History   Marital status: Single    Spouse name: Not on file   Number of children: 3   Years of education: 12   Highest education level: 12th grade  Occupational History   Occupation: Arts development officer  Tobacco Use   Smoking status: Never   Smokeless tobacco: Never  Vaping Use   Vaping status: Never Used  Substance and Sexual Activity   Alcohol use: No   Drug  use: No   Sexual activity: Never  Other Topics Concern   Not on file  Social History Narrative   Not on file   Social Drivers of Health   Financial Resource Strain: Low Risk  (04/08/2023)   Overall Financial Resource Strain (CARDIA)    Difficulty of Paying Living Expenses: Not hard at all  Food Insecurity: No Food Insecurity (04/08/2023)   Hunger Vital Sign    Worried About Running Out of Food in the Last Year: Never true    Ran Out of Food in the Last Year: Never true  Transportation Needs: No Transportation Needs (04/08/2023)   PRAPARE - Administrator, Civil Service (Medical): No    Lack of Transportation (Non-Medical): No  Physical Activity: Unknown (12/07/2022)   Exercise Vital Sign    Days of Exercise per Week: 2 days    Minutes of Exercise per Session: Not on file  Stress: No Stress Concern Present (12/07/2022)   [employer] of Occupational Health - Occupational Stress Questionnaire    Feeling of Stress : Not at all  Social Connections: Moderately Isolated (12/07/2022)   Social Connection and Isolation Panel [NHANES]    Frequency of Communication with Friends and Family: More than three times a week    Frequency of Social Gatherings with Friends and Family: More than three times a week    Attends Religious Services: More than 4 times per year    Active Member of [employer] or Organizations: No    Attends Banker Meetings: Never    Marital Status: Divorced  Catering manager Violence: Not At Risk (04/08/2023)   Humiliation, Afraid, Rape, and Kick questionnaire    Fear of Current or Ex-Partner: No    Emotionally Abused: No    Physically Abused: No    Sexually Abused: No    FH:  Family History  Problem Relation Age of Onset   Lung cancer Mother    Hypertension Father    CAD Father        a. MI age 33   Heart disease Father    COPD Neg Hx    Diabetes Mellitus II Neg Hx     Past Medical History:  Diagnosis Date   Carpal tunnel syndrome of right  wrist    Chronic combined systolic (congestive) and diastolic (congestive) heart failure (HCC)    a. 03/2017 Echo: EF 20-25%, diff HK, Gr2 DD; b. 09/2017 Echo: EF 20-25%, diff HK, Gr2 DD; c. 04/2020 Echo: EF 45-50%, mild LVH, antsept/basal ant HK, nl RV fxn; d. 11/2022 Echo: EF 25%, basal, mid & apical and antsept AK/DK.; e. 04/2023 Echo: EF 20-25%, mild conc LVH, GrI DD, nl RV fxn, small pericardial effusion.   Diabetes (HCC)    GERD (gastroesophageal reflux disease)    HLD (hyperlipidemia)    Hypertension    Lower leg DVT (deep venous thrombosis) (HCC)    NICM (nonischemic cardiomyopathy) (HCC)    a. 03/2017 Echo: EF 20-25%; b. 03/2017 Cath: mild nonobs dzs, EF 25%; c. 09/2017 Echo: EF 20-25%; d. 04/2020 Echo: EF 45-50%; e. 11/2022 Echo: EF 25%; f. 11/2022 MV: small, mild, fixed antsept/infsept defects, likely 2/2 LBBB->low risk; g. 04/2023 Echo: EF 20-25%.   Pleural effusion, left    a. 03/2017 s/p thoracentesis-->300 ml withdrawn--transudative.  Pneumonia 02/19/2017   Pulmonary embolism (HCC)    Stroke [facility])     Current Outpatient Medications  Medication Sig Dispense Refill   albuterol (VENTOLIN HFA) 108 (90 Base) MCG/ACT inhaler Inhale 1-2 puffs into the lungs every 6 (six) hours as needed for wheezing or shortness of breath.     Blood Glucose Monitoring Suppl DEVI 1 each by Does not apply route in the morning, at noon, and at bedtime. May substitute to any manufacturer covered by patient's insurance. 1 each 3   furosemide (LASIX) 40 MG tablet Take 1 tablet (40 mg total) by mouth daily. 90 tablet 3   glipiZIDE (GLIPIZIDE XL) 10 MG 24 hr tablet Take 1 tablet (10 mg total) by mouth daily with breakfast. 90 tablet 0   hydrALAZINE (APRESOLINE) 25 MG tablet Take 1 tablet (25 mg total) by mouth 3 (three) times daily. 90 tablet 6   Lancets (ONETOUCH DELICA PLUS LANCET30G) MISC      loperamide HCl (IMODIUM) 1 MG/7.5ML solution Take 2 mg by mouth 4 (four) times daily as needed for diarrhea or loose  stools.     metoprolol succinate (TOPROL-XL) 25 MG 24 hr tablet Take 1 tablet (25 mg total) by mouth daily. 90 tablet 1   rivaroxaban (XARELTO) 20 MG TABS tablet Take 1 tablet (20 mg total) by mouth daily with supper. 90 tablet 0   rosuvastatin (CRESTOR) 20 MG tablet Take 1 tablet (20 mg total) by mouth daily. 90 tablet 1   sacubitril-valsartan (ENTRESTO) 97-103 MG Take 1 tablet by mouth 2 (two) times daily. 180 tablet 1   No current facility-administered medications for this visit.      PHYSICAL EXAM:  General:  Well appearing. No resp difficulty HEENT: normal Neck: supple. JVP flat. Carotids 2+ bilaterally; no bruits. No lymphadenopathy or thryomegaly appreciated. Cor: PMI normal. Regular rate & rhythm. No rubs, gallops or murmurs. Lungs: clear Abdomen: soft, nontender, nondistended. No hepatosplenomegaly. No bruits or masses.  Extremities: no cyanosis, clubbing, rash, edema Neuro: alert & orientedx3, cranial nerves grossly intact. Moves all 4 extremities w/o difficulty. Affect pleasant.   ECG:   ASSESSMENT & PLAN:  1: NICM with reduced ejection fraction- - suspect due to HTN - NYHA class II - euvolemic - weighing daily; reminded to call for overnight weight gain of > 2 pounds or weekly weight gain of > 5 pounds - weight 124 pounds from last visit here 1 month ago - Echo 04/04/17: EF 20-25% with moderate LVH, Grade II DD, mild/ moderate MR - Echo 09/25/17: EF 20-25% with Grade II DD, mild MR - Echo 05/21/19: EF 45-50% with Grade I DD - Echo 11/29/22: EF 25% with Grade I DD, no thrombus - Stress test 12/03/22: EF >60%, small, mild in severity, fixed perfusion defect involving the mid anteroseptal and mid inferoseptal segments, small to moderate (mostly posteriorly 1.4 cm) pericardial effusion - continue furosemide 40mg  daily - continue metoprolol succinate 25mg  daily - continue entresto 97/103mg  BID - BNP 11/30/22 was 84  2: HTN- - BP  - continue hydralazine 25mg  TID - saw  PCP Sampson Si) 01/25 - BMP 04/15/23 showed sodium 137, potassium 4.2, creatinine 1.00 & GFR >60  3: Hyperlipidemia- - saw cardiology Mariah Milling) 11/24 - LDL 04/07/23 was 131 - continue rosuvastatin 20mg  daily  4: PE/DVT- - continue xarelto 15mg  BID until 05/01/23 and then 20mg  daily  - emphasized the importance of not missing any doses of xarelto  5: DM- - continue glipizide XL 5mg  daily - A1c 05/14/23  was 10.4%

## 2023-05-22 ENCOUNTER — Encounter: Payer: 59 | Admitting: Family

## 2023-05-22 ENCOUNTER — Inpatient Hospital Stay: Payer: 59 | Attending: Oncology | Admitting: Oncology

## 2023-05-29 NOTE — Progress Notes (Unsigned)
Advanced Heart Failure Clinic Note   PCP: Erin Munroe, NP (last seen 01/25) Cardiologist: Erin Nordmann, MD (last seen 11/24)  Chief Complaint:  HPI:  [name] is a 58 y/o female with a history of stroke, HTN, hypokalemia, T1DM, CKD, hyperlipidemia, DVT/ PE (2023) and chronic heart failure.   Admitted 11/28/22 due to extensive DVT and worsened HFrEF possibly due to old anterior infarct. Reports stress fracture diagnosed 8 weeks ago and relative immobilization. CT AP (11/28/22) with partially occlusive thrombus extending from the bilateral common femoral veins, iliac vein, and infrahepatic IVC. CTA PE (7/25) with no pulmonary embolism. Started on heparin gtt. Transitioned to Eliquis on 7/28. Hematology consulted and recommended monoclonal gammopathy workup and outpatient hypercoagulability workup. She will need lifelong anticoagulation. 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 Creatinine 2.43 up from 0.9 likely due to contrast nephropathy as she received contrasted studies 2 days prior. UA notable for high specific gravity and ketonuria. PVL renal artery duplex and veins showed no evidence of hemodynamically significant stenosis in the renal arteries. The resistive indices are also within normal limits so the IVC DVT extending into the renal vein is not contributing to the AKI per discussion with vascular surgery.   Admitted 04/06/23 due to acute onset of AMS that lasted about an hour. MRI of the brain showed small acute cortical infarct within the left insular, CT angiogram of the neck and head showed acute distal left M4 occlusion. CT angiogram of chest showed severe right sided pulmonary embolism with extension to multiple upper  lobes branches.  Duplex ultrasound showed extensive left lower extremity DVT. Patient has been seen by teleneurology, started on heparin drip. Patient is seen by vascular surgery, no need for intervention. She was also seen by hematology, she did not fail Eliquis treatment, she was not compliant. At this point, treatment will be changed to Xarelto. Echocardiogram has been performed, ejection fraction 20 to 25% without a PFO.   Echo 04/04/17: EF 20-25% with moderate LVH, Grade II DD, mild/ moderate MR Echo 09/25/17: EF 20-25% with Grade II DD, mild MR Echo 05/21/19: EF 45-50% with Grade I DD Echo 11/29/22: EF 25% with Grade I DD, no thrombus  Stress test 12/03/22: EF >60%, small, mild in severity, fixed perfusion defect involving the mid anteroseptal and mid inferoseptal segments, small to moderate (mostly posteriorly 1.4 cm) pericardial effusion  She presents today for a HF follow-up visit with a chief complaint of   Previous cardiac studies:  LHC 04/05/17: There is severe left ventricular systolic dysfunction. LV end diastolic pressure is severely elevated. The left ventricular ejection fraction is less than 25% by visual estimate.  1.  Mild nonobstructive coronary artery disease. 2.  Severely reduced LV systolic function with an ejection fraction of 15-20% with global hypokinesis. 3.  Severely elevated left ventricular end-diastolic pressure at 33 mmHg.  Chest CTA 11/29/22: No PE, small/ moderate pleural effusion     ROS: All systems negative except as listed in HPI, PMH and Problem List.  SH:  Social History   Socioeconomic History   Marital status: Single    Spouse name: Not on file   Number of children: 3   Years of education: 12   Highest education level: 12th grade  Occupational History   Occupation: Arts development officer  Tobacco Use   Smoking status: Never   Smokeless tobacco: Never  Vaping Use   Vaping status: Never Used  Substance and Sexual Activity   Alcohol use: No   Drug  use: No   Sexual activity: Never  Other Topics Concern   Not on file  Social History Narrative   Not on file   Social Drivers of Health   Financial Resource Strain: Low Risk  (04/08/2023)   Overall Financial Resource Strain (CARDIA)    Difficulty of Paying Living Expenses: Not hard at all  Food Insecurity: No Food Insecurity (04/08/2023)   Hunger Vital Sign    Worried About Running Out of Food in the Last Year: Never true    Ran Out of Food in the Last Year: Never true  Transportation Needs: No Transportation Needs (04/08/2023)   PRAPARE - Administrator, Civil Service (Medical): No    Lack of Transportation (Non-Medical): No  Physical Activity: Unknown (12/07/2022)   Exercise Vital Sign    Days of Exercise per Week: 2 days    Minutes of Exercise per Session: Not on file  Stress: No Stress Concern Present (12/07/2022)   [employer] of Occupational Health - Occupational Stress Questionnaire    Feeling of Stress : Not at all  Social Connections: Moderately Isolated (12/07/2022)   Social Connection and Isolation Panel [NHANES]    Frequency of Communication with Friends and Family: More than three times a week    Frequency of Social Gatherings with Friends and Family: More than three times a week    Attends Religious Services: More than 4 times per year    Active Member of [employer] or Organizations: No    Attends Banker Meetings: Never    Marital Status: Divorced  Catering manager Violence: Not At Risk (04/08/2023)   Humiliation, Afraid, Rape, and Kick questionnaire    Fear of Current or Ex-Partner: No    Emotionally Abused: No    Physically Abused: No    Sexually Abused: No    FH:  Family History  Problem Relation Age of Onset   Lung cancer Mother    Hypertension Father    CAD Father        a. MI age 33   Heart disease Father    COPD Neg Hx    Diabetes Mellitus II Neg Hx     Past Medical History:  Diagnosis Date   Carpal tunnel syndrome of right  wrist    Chronic combined systolic (congestive) and diastolic (congestive) heart failure (HCC)    a. 03/2017 Echo: EF 20-25%, diff HK, Gr2 DD; b. 09/2017 Echo: EF 20-25%, diff HK, Gr2 DD; c. 04/2020 Echo: EF 45-50%, mild LVH, antsept/basal ant HK, nl RV fxn; d. 11/2022 Echo: EF 25%, basal, mid & apical and antsept AK/DK.; e. 04/2023 Echo: EF 20-25%, mild conc LVH, GrI DD, nl RV fxn, small pericardial effusion.   Diabetes (HCC)    GERD (gastroesophageal reflux disease)    HLD (hyperlipidemia)    Hypertension    Lower leg DVT (deep venous thrombosis) (HCC)    NICM (nonischemic cardiomyopathy) (HCC)    a. 03/2017 Echo: EF 20-25%; b. 03/2017 Cath: mild nonobs dzs, EF 25%; c. 09/2017 Echo: EF 20-25%; d. 04/2020 Echo: EF 45-50%; e. 11/2022 Echo: EF 25%; f. 11/2022 MV: small, mild, fixed antsept/infsept defects, likely 2/2 LBBB->low risk; g. 04/2023 Echo: EF 20-25%.   Pleural effusion, left    a. 03/2017 s/p thoracentesis-->300 ml withdrawn--transudative.  Pneumonia 02/19/2017   Pulmonary embolism (HCC)    Stroke [facility])     Current Outpatient Medications  Medication Sig Dispense Refill   albuterol (VENTOLIN HFA) 108 (90 Base) MCG/ACT inhaler Inhale 1-2 puffs into the lungs every 6 (six) hours as needed for wheezing or shortness of breath.     Blood Glucose Monitoring Suppl DEVI 1 each by Does not apply route in the morning, at noon, and at bedtime. May substitute to any manufacturer covered by patient's insurance. 1 each 3   furosemide (LASIX) 40 MG tablet Take 1 tablet (40 mg total) by mouth daily. 90 tablet 3   glipiZIDE (GLIPIZIDE XL) 10 MG 24 hr tablet Take 1 tablet (10 mg total) by mouth daily with breakfast. 90 tablet 0   hydrALAZINE (APRESOLINE) 25 MG tablet Take 1 tablet (25 mg total) by mouth 3 (three) times daily. 90 tablet 6   Lancets (ONETOUCH DELICA PLUS LANCET30G) MISC      loperamide HCl (IMODIUM) 1 MG/7.5ML solution Take 2 mg by mouth 4 (four) times daily as needed for diarrhea or loose  stools.     metoprolol succinate (TOPROL-XL) 25 MG 24 hr tablet Take 1 tablet (25 mg total) by mouth daily. 90 tablet 1   rivaroxaban (XARELTO) 20 MG TABS tablet Take 1 tablet (20 mg total) by mouth daily with supper. 90 tablet 0   rosuvastatin (CRESTOR) 20 MG tablet Take 1 tablet (20 mg total) by mouth daily. 90 tablet 1   sacubitril-valsartan (ENTRESTO) 97-103 MG Take 1 tablet by mouth 2 (two) times daily. 180 tablet 1   No current facility-administered medications for this visit.      PHYSICAL EXAM:  General:  Well appearing. No resp difficulty HEENT: normal Neck: supple. JVP flat. Carotids 2+ bilaterally; no bruits. No lymphadenopathy or thryomegaly appreciated. Cor: PMI normal. Regular rate & rhythm. No rubs, gallops or murmurs. Lungs: clear Abdomen: soft, nontender, nondistended. No hepatosplenomegaly. No bruits or masses.  Extremities: no cyanosis, clubbing, rash, edema Neuro: alert & orientedx3, cranial nerves grossly intact. Moves all 4 extremities w/o difficulty. Affect pleasant.   ECG:   ASSESSMENT & PLAN:  1: NICM with reduced ejection fraction- - suspect due to HTN - NYHA class II - euvolemic - weighing daily; reminded to call for overnight weight gain of > 2 pounds or weekly weight gain of > 5 pounds - weight 124 pounds from last visit here 1 month ago - Echo 04/04/17: EF 20-25% with moderate LVH, Grade II DD, mild/ moderate MR - Echo 09/25/17: EF 20-25% with Grade II DD, mild MR - Echo 05/21/19: EF 45-50% with Grade I DD - Echo 11/29/22: EF 25% with Grade I DD, no thrombus - Stress test 12/03/22: EF >60%, small, mild in severity, fixed perfusion defect involving the mid anteroseptal and mid inferoseptal segments, small to moderate (mostly posteriorly 1.4 cm) pericardial effusion - continue furosemide 40mg  daily - continue metoprolol succinate 25mg  daily - continue entresto 97/103mg  BID - BNP 11/30/22 was 84  2: HTN- - BP  - continue hydralazine 25mg  TID - saw  PCP Sampson Si) 01/25 - BMP 04/15/23 showed sodium 137, potassium 4.2, creatinine 1.00 & GFR >60  3: Hyperlipidemia- - saw cardiology Mariah Milling) 11/24 - LDL 04/07/23 was 131 - continue rosuvastatin 20mg  daily  4: PE/DVT- - continue xarelto 15mg  BID until 05/01/23 and then 20mg  daily  - emphasized the importance of not missing any doses of xarelto  5: DM- - continue glipizide XL 5mg  daily - A1c 05/14/23  was 10.4%

## 2023-05-30 ENCOUNTER — Ambulatory Visit: Payer: 59 | Attending: Family | Admitting: Family

## 2023-05-30 ENCOUNTER — Encounter: Payer: Self-pay | Admitting: Family

## 2023-05-30 VITALS — BP 130/97 | HR 81 | Wt 115.0 lb

## 2023-05-30 DIAGNOSIS — I428 Other cardiomyopathies: Secondary | ICD-10-CM | POA: Diagnosis not present

## 2023-05-30 DIAGNOSIS — I130 Hypertensive heart and chronic kidney disease with heart failure and stage 1 through stage 4 chronic kidney disease, or unspecified chronic kidney disease: Secondary | ICD-10-CM | POA: Diagnosis present

## 2023-05-30 DIAGNOSIS — Z86711 Personal history of pulmonary embolism: Secondary | ICD-10-CM | POA: Diagnosis not present

## 2023-05-30 DIAGNOSIS — E782 Mixed hyperlipidemia: Secondary | ICD-10-CM | POA: Diagnosis not present

## 2023-05-30 DIAGNOSIS — Z86718 Personal history of other venous thrombosis and embolism: Secondary | ICD-10-CM | POA: Diagnosis not present

## 2023-05-30 DIAGNOSIS — Z7984 Long term (current) use of oral hypoglycemic drugs: Secondary | ICD-10-CM | POA: Diagnosis not present

## 2023-05-30 DIAGNOSIS — I825Y9 Chronic embolism and thrombosis of unspecified deep veins of unspecified proximal lower extremity: Secondary | ICD-10-CM | POA: Diagnosis not present

## 2023-05-30 DIAGNOSIS — M109 Gout, unspecified: Secondary | ICD-10-CM | POA: Diagnosis not present

## 2023-05-30 DIAGNOSIS — N189 Chronic kidney disease, unspecified: Secondary | ICD-10-CM | POA: Insufficient documentation

## 2023-05-30 DIAGNOSIS — Z7901 Long term (current) use of anticoagulants: Secondary | ICD-10-CM | POA: Diagnosis not present

## 2023-05-30 DIAGNOSIS — E785 Hyperlipidemia, unspecified: Secondary | ICD-10-CM | POA: Diagnosis not present

## 2023-05-30 DIAGNOSIS — E1122 Type 2 diabetes mellitus with diabetic chronic kidney disease: Secondary | ICD-10-CM | POA: Diagnosis not present

## 2023-05-30 DIAGNOSIS — I10 Essential (primary) hypertension: Secondary | ICD-10-CM | POA: Diagnosis not present

## 2023-05-30 DIAGNOSIS — I5022 Chronic systolic (congestive) heart failure: Secondary | ICD-10-CM | POA: Insufficient documentation

## 2023-05-30 DIAGNOSIS — E1022 Type 1 diabetes mellitus with diabetic chronic kidney disease: Secondary | ICD-10-CM | POA: Diagnosis present

## 2023-05-30 DIAGNOSIS — E119 Type 2 diabetes mellitus without complications: Secondary | ICD-10-CM

## 2023-05-30 DIAGNOSIS — Z794 Long term (current) use of insulin: Secondary | ICD-10-CM

## 2023-05-30 DIAGNOSIS — I509 Heart failure, unspecified: Secondary | ICD-10-CM | POA: Diagnosis present

## 2023-05-30 MED ORDER — DAPAGLIFLOZIN PROPANEDIOL 10 MG PO TABS: 10.0000 mg | ORAL_TABLET | Freq: Every day | ORAL | 11 refills | Status: DC

## 2023-05-30 NOTE — Patient Instructions (Addendum)
START FARXIGA 10 MG ONCE DAILY

## 2023-05-31 ENCOUNTER — Encounter: Payer: Self-pay | Admitting: Internal Medicine

## 2023-05-31 ENCOUNTER — Ambulatory Visit: Payer: Self-pay

## 2023-05-31 ENCOUNTER — Ambulatory Visit (INDEPENDENT_AMBULATORY_CARE_PROVIDER_SITE_OTHER): Payer: 59 | Admitting: Internal Medicine

## 2023-05-31 VITALS — BP 130/88 | Ht 59.0 in | Wt 115.0 lb

## 2023-05-31 DIAGNOSIS — E1149 Type 2 diabetes mellitus with other diabetic neurological complication: Secondary | ICD-10-CM | POA: Diagnosis not present

## 2023-05-31 DIAGNOSIS — R2242 Localized swelling, mass and lump, left lower limb: Secondary | ICD-10-CM

## 2023-05-31 DIAGNOSIS — M79672 Pain in left foot: Secondary | ICD-10-CM

## 2023-05-31 DIAGNOSIS — Z794 Long term (current) use of insulin: Secondary | ICD-10-CM | POA: Diagnosis not present

## 2023-05-31 MED ORDER — KETOROLAC TROMETHAMINE 30 MG/ML IJ SOLN
30.0000 mg | Freq: Once | INTRAMUSCULAR | Status: AC
  Administered 2023-05-31: 30 mg via INTRAMUSCULAR

## 2023-05-31 MED ORDER — INDOMETHACIN 50 MG PO CAPS: 50.0000 mg | ORAL_CAPSULE | Freq: Three times a day (TID) | ORAL | 0 refills | Status: DC

## 2023-05-31 NOTE — Telephone Encounter (Signed)
  Chief Complaint: foot pain Symptoms: L foot pain, 10/10, swollen, redness Frequency: 1 week Pertinent Negatives: NA Disposition: [] ED /[] Urgent Care (no appt availability in office) / [x] Appointment(In office/virtual)/ []  [place] [facility]/ [] Home Care/ [] Refused Recommended Disposition /[] [facility] [place] [facility]/ []  Follow-up with PCP Additional Notes: pt was told by Cardiology to schedule appt with PCP for possible gout flare up. Pt hasn't taken Ibuprofen or Naproxen for pain. Scheduled OV today at 200 with PCP.   Summary: foot pain   Patient states that she has had pain in her left foot for about a week. She states she went to her cardiologist yesterday and they told her she probably had gout and she needed to contact her pcp for medication.     Reason for Disposition  [1] Swollen foot AND [2] no fever  (Exceptions: localized bump from bunions, calluses, insect bite, sting)  Answer Assessment - Initial Assessment Questions 1. ONSET: "When did the pain start?"      1 week  2. LOCATION: "Where is the pain located?"      L foot  3. PAIN: "How bad is the pain?"    (Scale 1-10; or mild, moderate, severe)  - MILD (1-3): doesn't interfere with normal activities.   - MODERATE (4-7): interferes with normal activities (e.g., work or school) or awakens from sleep, limping.   - SEVERE (8-10): excruciating pain, unable to do any normal activities, unable to walk.      10/10 5. CAUSE: "What do you think is causing the foot pain?"     Possible gout  6. OTHER SYMPTOMS: "Do you have any other symptoms?" (e.g., leg pain, rash, fever, numbness)     Swelling and redness  Protocols used: Foot Pain-A-AH

## 2023-05-31 NOTE — Patient Instructions (Signed)
Gout  Gout is painful swelling of your joints. Gout is a type of arthritis. It is caused by having too much uric acid in your body. Uric acid is a chemical that is made when your body breaks down substances called purines. If your body has too much uric acid, sharp crystals can form and build up in your joints. This causes pain and swelling. Gout attacks can happen quickly and be very painful (acute gout). Over time, the attacks can affect more joints and happen more often (chronic gout). What are the causes? Gout is caused by too much uric acid in your blood. This can happen because: Your kidneys do not remove enough uric acid from your blood. Your body makes too much uric acid. You eat too many foods that are high in purines. These foods include organ meats, some seafood, and beer. Trauma or stress can bring on an attack. What increases the risk? Having a family history of gout. Being female and middle-aged. Being female and having gone through menopause. Having an organ transplant. Taking certain medicines. Having certain conditions, such as: Being very overweight (obese). Lead poisoning. Kidney disease. A skin condition called psoriasis. Other risks include: Losing weight too quickly. Not having enough water in the body (being dehydrated). Drinking alcohol, especially beer. Drinking beverages that are sweetened with a type of sugar called fructose. What are the signs or symptoms? An attack of acute gout often starts at night and usually happens in just one joint. The most common place is the big toe. Other joints that may be affected include joints of the feet, ankle, knee, fingers, wrist, or elbow. Symptoms may include: Very bad pain. Warmth. Swelling. Stiffness. Tenderness. The affected joint may be very painful to touch. Shiny, red, or purple skin. Chills and fever. Chronic gout may cause symptoms more often. More joints may be involved. You may also have white or yellow lumps  (tophi) on your hands or feet or in other areas near your joints. How is this treated? Treatment for an acute attack may include medicines for pain and swelling, such as: NSAIDs, such as ibuprofen. Steroids taken by mouth or injected into a joint. Colchicine. This can be given by mouth or through an IV tube. Treatment to prevent future attacks may include: Taking small doses of NSAIDs or colchicine daily. Using a medicine that reduces uric acid levels in your blood, such as allopurinol. Making changes to your diet. You may need to see a food expert (dietitian) about what to eat and drink to prevent gout. Follow these instructions at home: During a gout attack  If told, put ice on the painful area. To do this: Put ice in a plastic bag. Place a towel between your skin and the bag. Leave the ice on for 20 minutes, 2-3 times a day. Take off the ice if your skin turns bright red. This is very important. If you cannot feel pain, heat, or cold, you have a greater risk of damage to the area. Raise the painful joint above the level of your heart as often as you can. Rest the joint as much as possible. If the joint is in your leg, you may be given crutches. Follow instructions from your doctor about what you cannot eat or drink. Avoiding future gout attacks Eat a low-purine diet. Avoid foods and drinks such as: Liver. Kidney. Anchovies. Asparagus. Herring. Mushrooms. Mussels. Beer. Stay at a healthy weight. If you want to lose weight, talk with your doctor. Do not  lose weight too fast. Start or continue an exercise plan as told by your doctor. Eating and drinking Avoid drinks sweetened by fructose. Drink enough fluids to keep your pee (urine) pale yellow. If you drink alcohol: Limit how much you have to: 0-1 drink a day for women who are not pregnant. 0-2 drinks a day for men. Know how much alcohol is in a drink. In the [place] one drink equals one 12 oz bottle of beer (355 mL), one 5 oz  glass of wine (148 mL), or one 1 oz glass of hard liquor (44 mL). General instructions Take over-the-counter and prescription medicines only as told by your doctor. Ask your doctor if you should avoid driving or using machines while you are taking your medicine. Return to your normal activities when your doctor says that it is safe. Keep all follow-up visits. Where to find more information [employer]: www.niams.http://www.myers.net/ Contact a doctor if: You have another gout attack. You still have symptoms of a gout attack after 10 days of treatment. You have problems (side effects) because of your medicines. You have chills or a fever. You have burning pain when you pee (urinate). You have pain in your lower back or belly. Get help right away if: You have very bad pain. Your pain cannot be controlled. You cannot pee. Summary Gout is painful swelling of the joints. The most common site of pain is the big toe, but it can affect other joints. Medicines and avoiding some foods can help to prevent and treat gout attacks. This information is not intended to replace advice given to you by your health care provider. Make sure you discuss any questions you have with your health care provider. Document Revised: 01/25/2021 Document Reviewed: 01/25/2021 Elsevier Patient Education  2024 [employer].

## 2023-05-31 NOTE — Addendum Note (Signed)
Addended by: Luanna Cole D on: 05/31/2023 03:08 PM   Modules accepted: Orders

## 2023-05-31 NOTE — Progress Notes (Signed)
Subjective:    Patient ID: Erin Hayes, female    DOB: [birth year], 58 y.o.   MRN: [id number]  HPI  Discussed the use of AI scribe software for clinical note transcription with the patient, who gave verbal consent to proceed.  The patient, with a history of diabetes and kidney disease, presents with left foot pain and swelling for approximately one week. The pain is localized to a specific part of the foot, which has been persistently swollen, but the swelling has recently increased. The patient has limited ability to wiggle her toes due to the swelling. She denies any recent injury or fall that could have precipitated this condition.  The patient has a past history of gout, but has not taken any over-the-counter medication for the current symptoms. She had a recent consultation with a cardiologist, who suggested the possibility of gout. However, no uric acid level was checked during this acute episode.  The patient's diabetes and past kidney disease were considered in the context of her current symptoms. The patient was previously treated with prednisone, but this was deemed inappropriate due to the potential for exacerbating her diabetes.        Review of Systems     Past Medical History:  Diagnosis Date   Carpal tunnel syndrome of right wrist    Chronic combined systolic (congestive) and diastolic (congestive) heart failure (HCC)    a. 03/2017 Echo: EF 20-25%, diff HK, Gr2 DD; b. 09/2017 Echo: EF 20-25%, diff HK, Gr2 DD; c. 04/2020 Echo: EF 45-50%, mild LVH, antsept/basal ant HK, nl RV fxn; d. 11/2022 Echo: EF 25%, basal, mid & apical and antsept AK/DK.; e. 04/2023 Echo: EF 20-25%, mild conc LVH, GrI DD, nl RV fxn, small pericardial effusion.   Diabetes (HCC)    GERD (gastroesophageal reflux disease)    HLD (hyperlipidemia)    Hypertension    Lower leg DVT (deep venous thrombosis) (HCC)    NICM (nonischemic cardiomyopathy) (HCC)    a. 03/2017 Echo: EF 20-25%; b. 03/2017 Cath: mild  nonobs dzs, EF 25%; c. 09/2017 Echo: EF 20-25%; d. 04/2020 Echo: EF 45-50%; e. 11/2022 Echo: EF 25%; f. 11/2022 MV: small, mild, fixed antsept/infsept defects, likely 2/2 LBBB->low risk; g. 04/2023 Echo: EF 20-25%.   Pleural effusion, left    a. 03/2017 s/p thoracentesis-->300 ml withdrawn--transudative.   Pneumonia 02/19/2017   Pulmonary embolism (HCC)    Stroke [facility])     Current Outpatient Medications  Medication Sig Dispense Refill   albuterol (VENTOLIN HFA) 108 (90 Base) MCG/ACT inhaler Inhale 1-2 puffs into the lungs every 6 (six) hours as needed for wheezing or shortness of breath.     Blood Glucose Monitoring Suppl DEVI 1 each by Does not apply route in the morning, at noon, and at bedtime. May substitute to any manufacturer covered by patient's insurance. 1 each 3   dapagliflozin propanediol (FARXIGA) 10 MG TABS tablet Take 1 tablet (10 mg total) by mouth daily before breakfast. 30 tablet 11   furosemide (LASIX) 40 MG tablet Take 1 tablet (40 mg total) by mouth daily. 90 tablet 3   glipiZIDE (GLIPIZIDE XL) 10 MG 24 hr tablet Take 1 tablet (10 mg total) by mouth daily with breakfast. 90 tablet 0   hydrALAZINE (APRESOLINE) 25 MG tablet Take 1 tablet (25 mg total) by mouth 3 (three) times daily. 90 tablet 6   Lancets (ONETOUCH DELICA PLUS LANCET30G) MISC      loperamide HCl (IMODIUM) 1 MG/7.5ML solution Take 2 mg  by mouth 4 (four) times daily as needed for diarrhea or loose stools.     metoprolol succinate (TOPROL-XL) 25 MG 24 hr tablet Take 1 tablet (25 mg total) by mouth daily. 90 tablet 1   rivaroxaban (XARELTO) 20 MG TABS tablet Take 1 tablet (20 mg total) by mouth daily with supper. 90 tablet 0   rosuvastatin (CRESTOR) 20 MG tablet Take 1 tablet (20 mg total) by mouth daily. 90 tablet 1   sacubitril-valsartan (ENTRESTO) 97-103 MG Take 1 tablet by mouth 2 (two) times daily. 180 tablet 1   No current facility-administered medications for this visit.    No Known Allergies  Family  History  Problem Relation Age of Onset   Lung cancer Mother    Hypertension Father    CAD Father        a. MI age 21   Heart disease Father    COPD Neg Hx    Diabetes Mellitus II Neg Hx     Social History   Socioeconomic History   Marital status: Single    Spouse name: Not on file   Number of children: 3   Years of education: 12   Highest education level: 12th grade  Occupational History   Occupation: Arts development officer  Tobacco Use   Smoking status: Never   Smokeless tobacco: Never  Vaping Use   Vaping status: Never Used  Substance and Sexual Activity   Alcohol use: No   Drug use: No   Sexual activity: Never  Other Topics Concern   Not on file  Social History Narrative   Not on file   Social Drivers of Health   Financial Resource Strain: Low Risk  (04/08/2023)   Overall Financial Resource Strain (CARDIA)    Difficulty of Paying Living Expenses: Not hard at all  Food Insecurity: No Food Insecurity (04/08/2023)   Hunger Vital Sign    Worried About Running Out of Food in the Last Year: Never true    Ran Out of Food in the Last Year: Never true  Transportation Needs: No Transportation Needs (04/08/2023)   PRAPARE - Administrator, Civil Service (Medical): No    Lack of Transportation (Non-Medical): No  Physical Activity: Unknown (12/07/2022)   Exercise Vital Sign    Days of Exercise per Week: 2 days    Minutes of Exercise per Session: Not on file  Stress: No Stress Concern Present (12/07/2022)   [employer] of Occupational Health - Occupational Stress Questionnaire    Feeling of Stress : Not at all  Social Connections: Moderately Isolated (12/07/2022)   Social Connection and Isolation Panel [NHANES]    Frequency of Communication with Friends and Family: More than three times a week    Frequency of Social Gatherings with Friends and Family: More than three times a week    Attends Religious Services: More than 4 times per year    Active Member of [employer] or  Organizations: No    Attends Banker Meetings: Never    Marital Status: Divorced  Catering manager Violence: Not At Risk (04/08/2023)   Humiliation, Afraid, Rape, and Kick questionnaire    Fear of Current or Ex-Partner: No    Emotionally Abused: No    Physically Abused: No    Sexually Abused: No     Constitutional: Denies fever, malaise, fatigue, headache or abrupt weight changes.  Respiratory: Denies difficulty breathing, shortness of breath, cough or sputum production.   Cardiovascular:  Denies chest pain, chest tightness,  palpitations or swelling in the hands or feet.  Musculoskeletal: Patient reports pain and swelling of left foot,, difficulty with gait.  Denies muscle pain.  Skin: Patient reports redness and warmth of left foot.  Denies rashes, lesions or ulcercations.  Neurological: Patient reports persistent swelling in left ankle, difficulty with gait.  No other specific complaints in a complete review of systems (except as listed in HPI above).  Objective:   Physical Exam BP 130/88 (BP Location: Left Arm, Patient Position: Sitting, Cuff Size: Normal)   Ht 4\' 11"  (1.499 m)   Wt 115 lb (52.2 kg)   BMI 23.23 kg/m    Wt Readings from Last 3 Encounters:  05/30/23 115 lb (52.2 kg)  05/14/23 117 lb (53.1 kg)  04/15/23 124 lb (56.2 kg)    General: Appears her stated age, chronically ill-appearing, in NAD. Skin: Warm, dry and intact.  Mild redness and warmth noted over the dorsal aspect of the left foot overlying the 2nd through 5th distal metatarsals.  Fungal infection noted of the toenails of left foot. Cardiovascular: Normal rate and rhythm.  Pedal pulse 2+ on the left. Pulmonary/Chest: Normal effort and positive vesicular breath sounds. No respiratory distress. No wheezes, rales or ronchi noted.  Abdomen:  Musculoskeletal: In wheelchair.  1+ swelling noted of the left foot and left ankle.  Pain with palpation over the dorsal aspect of the left foot at the 2nd  through 5th distal metatarsals.     BMET    Component Value Date/Time   NA 137 04/15/2023 1600   NA 142 06/19/2018 1905   NA 129 (L) 04/25/2013 2004   K 4.2 04/15/2023 1600   K 5.7 (H) 04/25/2013 2004   CL 106 04/15/2023 1600   CL 97 (L) 04/25/2013 2004   CO2 21 (L) 04/15/2023 1600   CO2 12 (L) 04/25/2013 2004   GLUCOSE 191 (H) 04/15/2023 1600   GLUCOSE 370 (H) 04/25/2013 2004   BUN 16 04/15/2023 1600   BUN 14 06/19/2018 1905   BUN 17 04/25/2013 2004   CREATININE 1.00 04/15/2023 1600   CREATININE 0.81 01/31/2023 1126   CALCIUM 8.3 (L) 04/15/2023 1600   CALCIUM 9.7 04/25/2013 2004   GFRNONAA >60 04/15/2023 1600   GFRNONAA 66 03/28/2020 1044   GFRAA 77 03/28/2020 1044    Lipid Panel     Component Value Date/Time   CHOL 217 (H) 04/07/2023 0447   CHOL 216 (H) 06/19/2018 1905   TRIG 106 04/07/2023 0447   HDL 65 04/07/2023 0447   HDL 53 06/19/2018 1905   CHOLHDL 3.3 04/07/2023 0447   VLDL 21 04/07/2023 0447   LDLCALC 131 (H) 04/07/2023 0447   LDLCALC 79 01/31/2023 1126    CBC    Component Value Date/Time   WBC 9.2 04/09/2023 0301   RBC 4.08 04/09/2023 0301   HGB 11.3 (L) 04/09/2023 0301   HGB 11.6 06/19/2018 1905   HCT 34.8 (L) 04/09/2023 0301   HCT 35.4 06/19/2018 1905   PLT 303 04/09/2023 0301   PLT 435 06/19/2018 1905   MCV 85.3 04/09/2023 0301   MCV 85 06/19/2018 1905   MCV 88 04/25/2013 2004   MCH 27.7 04/09/2023 0301   MCHC 32.5 04/09/2023 0301   RDW 13.9 04/09/2023 0301   RDW 12.4 06/19/2018 1905   RDW 12.3 04/25/2013 2004   LYMPHSABS 2.0 04/25/2022 1608   MONOABS 0.5 04/25/2022 1608   EOSABS 0.1 04/25/2022 1608   BASOSABS 0.1 04/25/2022 1608    Hgb A1C Lab  Results  Component Value Date   HGBA1C 10.4 (A) 05/14/2023            Assessment & Plan:   Assessment and Plan    Left Foot Pain and Swelling Suspected gout flare, but unable to confirm with uric acid level due to acute episode. Patient has a history of gout. Prednisone  contraindicated due to diabetes. -Administer Toradol injection today. -Prescribe Indomethacin 50mg  TID for 5 days. -Advise patient to elevate foot at home. -Check uric acid level at next visit in 3 months to confirm gout diagnosis.  Diabetes Chronic condition, no acute issues discussed. -Continue current management.  Chronic Kidney Disease Stable, last kidney function test was normal. -Continue current management.       RTC in 3 months, follow-up chronic conditions Nicki Reaper, NP

## 2023-06-10 ENCOUNTER — Other Ambulatory Visit: Payer: Self-pay

## 2023-06-10 ENCOUNTER — Telehealth: Payer: Self-pay | Admitting: Pharmacist

## 2023-06-10 NOTE — Telephone Encounter (Signed)
   Outreach Note  06/10/2023 Name: ADLYNN LOWENSTEIN MRN: [id number] DOB: 1966-03-29  Referred by: Lorre Munroe, NP Reason for referral : Medication Adherence   Was unable to reach patient via telephone today and have left HIPAA compliant voicemail asking patient to return my call.    Follow Up Plan: Will collaborate with Care Guide to outreach to schedule follow up with me  Estelle Grumbles, PharmD, [facility] Clinical Pharmacist [facility] [phone]

## 2023-06-17 ENCOUNTER — Other Ambulatory Visit: Payer: Self-pay

## 2023-06-17 ENCOUNTER — Emergency Department
Admission: EM | Admit: 2023-06-17 | Discharge: 2023-06-17 | Disposition: A | Discharge status: Home / Self Care | Payer: 59 | Attending: Emergency Medicine | Admitting: Emergency Medicine

## 2023-06-17 ENCOUNTER — Emergency Department: Payer: 59

## 2023-06-17 DIAGNOSIS — R109 Unspecified abdominal pain: Secondary | ICD-10-CM | POA: Diagnosis not present

## 2023-06-17 DIAGNOSIS — T148XXA Other injury of unspecified body region, initial encounter: Secondary | ICD-10-CM

## 2023-06-17 DIAGNOSIS — X58XXXS Exposure to other specified factors, sequela: Secondary | ICD-10-CM | POA: Insufficient documentation

## 2023-06-17 DIAGNOSIS — I509 Heart failure, unspecified: Secondary | ICD-10-CM | POA: Insufficient documentation

## 2023-06-17 DIAGNOSIS — E1165 Type 2 diabetes mellitus with hyperglycemia: Secondary | ICD-10-CM | POA: Diagnosis not present

## 2023-06-17 DIAGNOSIS — S92902S Unspecified fracture of left foot, sequela: Secondary | ICD-10-CM | POA: Diagnosis not present

## 2023-06-17 DIAGNOSIS — M79675 Pain in left toe(s): Secondary | ICD-10-CM | POA: Diagnosis present

## 2023-06-17 DIAGNOSIS — I110 Hypertensive heart disease with heart failure: Secondary | ICD-10-CM | POA: Insufficient documentation

## 2023-06-17 DIAGNOSIS — Z9049 Acquired absence of other specified parts of digestive tract: Secondary | ICD-10-CM | POA: Diagnosis not present

## 2023-06-17 DIAGNOSIS — I700 Atherosclerosis of aorta: Secondary | ICD-10-CM | POA: Diagnosis not present

## 2023-06-17 LAB — CBC WITH DIFFERENTIAL/PLATELET
Abs Immature Granulocytes: 0.03 10*3/uL (ref 0.00–0.07)
Basophils Absolute: 0 10*3/uL (ref 0.0–0.1)
Basophils Relative: 0 %
Eosinophils Absolute: 0 10*3/uL (ref 0.0–0.5)
Eosinophils Relative: 0 %
HCT: 36 % (ref 36.0–46.0)
Hemoglobin: 11.7 g/dL — ABNORMAL LOW (ref 12.0–15.0)
Immature Granulocytes: 0 %
Lymphocytes Relative: 12 %
Lymphs Abs: 0.9 10*3/uL (ref 0.7–4.0)
MCH: 27.8 pg (ref 26.0–34.0)
MCHC: 32.5 g/dL (ref 30.0–36.0)
MCV: 85.5 fL (ref 80.0–100.0)
Monocytes Absolute: 0.6 10*3/uL (ref 0.1–1.0)
Monocytes Relative: 8 %
Neutro Abs: 6 10*3/uL (ref 1.7–7.7)
Neutrophils Relative %: 80 %
Platelets: 255 10*3/uL (ref 150–400)
RBC: 4.21 MIL/uL (ref 3.87–5.11)
RDW: 12.6 % (ref 11.5–15.5)
WBC: 7.6 10*3/uL (ref 4.0–10.5)
nRBC: 0 % (ref 0.0–0.2)

## 2023-06-17 LAB — COMPREHENSIVE METABOLIC PANEL
ALT: 31 U/L (ref 0–44)
AST: 19 U/L (ref 15–41)
Albumin: 3.4 g/dL — ABNORMAL LOW (ref 3.5–5.0)
Alkaline Phosphatase: 70 U/L (ref 38–126)
Anion gap: 10 (ref 5–15)
BUN: 15 mg/dL (ref 6–20)
CO2: 21 mmol/L — ABNORMAL LOW (ref 22–32)
Calcium: 8.2 mg/dL — ABNORMAL LOW (ref 8.9–10.3)
Chloride: 106 mmol/L (ref 98–111)
Creatinine, Ser: 1.13 mg/dL — ABNORMAL HIGH (ref 0.44–1.00)
GFR, Estimated: 56 mL/min — ABNORMAL LOW (ref >60)
Glucose, Bld: 399 mg/dL — ABNORMAL HIGH (ref 70–99)
Potassium: 3.3 mmol/L — ABNORMAL LOW (ref 3.5–5.1)
Sodium: 137 mmol/L (ref 135–145)
Total Bilirubin: 1.1 mg/dL (ref 0.0–1.2)
Total Protein: 6.8 g/dL (ref 6.5–8.1)

## 2023-06-17 LAB — URINALYSIS, ROUTINE W REFLEX MICROSCOPIC
Bacteria, UA: NONE SEEN
Bilirubin Urine: NEGATIVE
Glucose, UA: >=500 mg/dL — AB
Ketones, ur: NEGATIVE mg/dL
Leukocytes,Ua: NEGATIVE
Nitrite: NEGATIVE
Protein, ur: 30 mg/dL — AB
Specific Gravity, Urine: 1.023 (ref 1.005–1.030)
pH: 5 (ref 5.0–8.0)

## 2023-06-17 LAB — URIC ACID: Uric Acid, Serum: 5.5 mg/dL (ref 2.5–7.1)

## 2023-06-17 MED ORDER — SODIUM CHLORIDE 0.9 % IV BOLUS
1000.0000 mL | Freq: Once | INTRAVENOUS | Status: AC
  Administered 2023-06-17: 1000 mL via INTRAVENOUS

## 2023-06-17 MED ORDER — ACETAMINOPHEN 500 MG PO TABS: 1000.0000 mg | ORAL_TABLET | Freq: Four times a day (QID) | ORAL | 0 refills | Status: AC | PRN

## 2023-06-17 MED ORDER — IBUPROFEN 600 MG PO TABS
600.0000 mg | ORAL_TABLET | Freq: Once | ORAL | Status: AC
  Administered 2023-06-17: 600 mg via ORAL
  Filled 2023-06-17: qty 1

## 2023-06-17 NOTE — ED Provider Triage Note (Signed)
 Emergency Medicine Provider Triage Evaluation Note  TANZA VRANICH , a 58 y.o. female  was evaluated in triage.  Pt complains of left sided flank pain since this weekend. That worsens with movement. Also having left foot pain that her cardiologist believes is gout but does not take medicine for this.  Review of Systems  Positive: Flank pain, foot pain Negative: Urinary symptoms  Physical Exam  There were no vitals taken for this visit. Gen:   Awake, no distress   Resp:  Normal effort  MSK:   Moves extremities without difficulty  Other:    Medical Decision Making  Medically screening exam initiated at 3:29 PM.  Appropriate orders placed.  OLUWATOMI SCHEFFLER was informed that the remainder of the evaluation will be completed by another provider, this initial triage assessment does not replace that evaluation, and the importance of remaining in the ED until their evaluation is complete.     Phyliss Breen, PA-C 06/17/23 1531

## 2023-06-17 NOTE — ED Provider Notes (Signed)
 [facility] Provider Note    Event Date/Time   First MD Initiated Contact with Patient 06/17/23 1622     (approximate)   History   Flank Pain and Foot Pain   HPI  Erin Hayes is a 58 y.o. female , presents today with history of 1 week of left great toe pain, she saw her PCP who will diagnose gout.  Patient states history of 24 hours of left flank pain.  Patient has history of diabetes, hypertension and cardiac heart failure.      Physical Exam   Triage Vital Signs: ED Triage Vitals  Encounter Vitals Group     BP 06/17/23 1529 (!) 127/91     Systolic BP Percentile --      Diastolic BP Percentile --      Pulse Rate 06/17/23 1529 95     Resp 06/17/23 1529 17     Temp 06/17/23 1529 98.4 F (36.9 C)     Temp Source 06/17/23 1529 Oral     SpO2 06/17/23 1529 95 %     Weight --      Height --      Head Circumference --      Peak Flow --      Pain Score 06/17/23 1530 8     Pain Loc --      Pain Education --      Exclude from Growth Chart --     Most recent vital signs: Vitals:   06/17/23 1529  BP: (!) 127/91  Pulse: 95  Resp: 17  Temp: 98.4 F (36.9 C)  SpO2: 95%     Constitutional: Alert nondistressed Eyes: Conjunctivae are normal.  Head: Atraumatic. Nose: No congestion/rhinnorhea. Mouth/Throat: Mucous membranes are moist.   Neck: Painless ROM.  Cardiovascular:   Good peripheral circulation.  Regular rhythm Respiratory: Normal respiratory effort.  No retractions.  No retractions no crackles Gastrointestinal: Soft and nontender.  Musculoskeletal:  no deformity Left lower flank tender to palpation, CVA tenderness positive. Left foot: Great toe: Presence of fungal infection on toenails.  First metatarsal without redness, edema, tenderness.  Neurologic:  MAE spontaneously. No gross focal neurologic deficits are appreciated.  Skin:  Skin is warm, dry and intact. No rash noted. Psychiatric: Mood and affect are normal. Speech and  behavior are normal.    ED Results / Procedures / Treatments   Labs (all labs ordered are listed, but only abnormal results are displayed) Labs Reviewed  CBC WITH DIFFERENTIAL/PLATELET - Abnormal; Notable for the following components:      Result Value   Hemoglobin 11.7 (*)    All other components within normal limits  COMPREHENSIVE METABOLIC PANEL - Abnormal; Notable for the following components:   Potassium 3.3 (*)    CO2 21 (*)    Glucose, Bld 399 (*)    Creatinine, Ser 1.13 (*)    Calcium  8.2 (*)    Albumin 3.4 (*)    GFR, Estimated 56 (*)    All other components within normal limits  URINALYSIS, ROUTINE W REFLEX MICROSCOPIC - Abnormal; Notable for the following components:   Color, Urine YELLOW (*)    APPearance HAZY (*)    Glucose, UA >=500 (*)    Hgb urine dipstick SMALL (*)    Protein, ur 30 (*)    All other components within normal limits  URIC ACID     EKG     RADIOLOGY I independently reviewed and interpreted imaging and agree with radiologists  findings.      PROCEDURES:  Critical Care performed:   Procedures   MEDICATIONS ORDERED IN ED: Medications  sodium chloride  0.9 % bolus 1,000 mL (has no administration in time range)  ibuprofen  (ADVIL ) tablet 600 mg (600 mg Oral Given 06/17/23 1658)     IMPRESSION / MDM / ASSESSMENT AND PLAN / ED COURSE  I reviewed the triage vital signs and the nursing notes.  Differential diagnosis includes, but is not limited to, UTI, muscular strain, gout, fracture, ureterolithiasis  Patient's presentation is most consistent with acute complicated illness / injury requiring diagnostic workup.   Patient's diagnosis is consistent with hyperglycemia, hyperkalemia, old fracture in the foot. I independently reviewed and interpreted imaging and agree with radiologists findings. Labs are rea reassuring.  Diagnostic test ruled out urolithiasis, gout.  I did review the patient's allergies and medications.  During admission  patient received IV fluids and ibuprofen , patient states pain improved.  Patient will be discharged home with prescriptions for acetaminophen . Patient is to follow up with PCP as needed or otherwise directed. Patient is given ED precautions to return to the ED for any worsening or new symptoms. Discussed plan of care with patient, answered all of patient's questions, Patient agreeable to plan of care. Advised patient to take medications according to the instructions on the label. Discussed possible side effects of new medications. Patient verbalized understanding. Clinical Course as of 06/17/23 2018  Mon Jun 17, 2023  1906 CT Renal Stone Study No acute finding by CT. No evidence of urinary tract stone disease or other urinary tract pathology. 2. Previous cholecystectomy. 3. Aortic atherosclerosis. 4. No abnormality seen to explain left flank pain.   [AE]  1906 DG Foot Complete Left No acute finding. Old healed fractures at the distal metatarsals of the second through fifth toes. No radiographic sign of gout of the great toe.   [AE]    Clinical Course User Index [AE] Awilda Lennox, PA-C     FINAL CLINICAL IMPRESSION(S) / ED DIAGNOSES   Final diagnoses:  Hyperglycemia due to diabetes mellitus (HCC)  Muscle strain  Foot fracture, left, sequela     Rx / DC Orders   ED Discharge Orders          Ordered    acetaminophen  (TYLENOL ) 500 MG tablet  Every 6 hours PRN        06/17/23 2016             Note:  This document was prepared using Dragon voice recognition software and may include unintentional dictation errors.   Awilda Lennox, PA-C 06/17/23 2019    Bradler, Evan K, MD 06/17/23 2112

## 2023-06-17 NOTE — ED Triage Notes (Signed)
 Pt c/o L flank pain since Friday without urinary sx or other associated sx. Pt said pain is worse with movement. Pt L foot has been hurting x3 weeks. Pt states, "My heart doctor thinks it might be gout." Pt is not on medication for gout.

## 2023-06-17 NOTE — Discharge Instructions (Addendum)
 You have been diagnosed with muscle strain, foot fracture left sequela.  Please take acetaminophen  every 6 hours for pain.  Please make an appointment with your PCP for a follow-up of your blood sugar, and potassium.  Please come back to ED ago to your PCP if you have new symptoms or symptoms worsen.

## 2023-06-18 ENCOUNTER — Telehealth: Payer: Self-pay

## 2023-06-18 NOTE — Transitions of Care (Post Inpatient/ED Visit) (Signed)
   06/18/2023  Name: Erin Hayes MRN: [id number] DOB: 07/22/1965  Today's TOC FU Call Status: Today's TOC FU Call Status:: Unsuccessful Call (1st Attempt) Unsuccessful Call (1st Attempt) Date: 06/18/23  Attempted to reach the patient regarding the most recent Inpatient/ED visit.  Follow Up Plan: Additional outreach attempts will be made to reach the patient to complete the Transitions of Care (Post Inpatient/ED visit) call.   Signature Karena Addison, LPN [facility] Nurse Health Advisor Direct Dial [phone]

## 2023-06-20 ENCOUNTER — Other Ambulatory Visit: Payer: 59

## 2023-06-20 NOTE — Transitions of Care (Post Inpatient/ED Visit) (Signed)
   06/20/2023  Name: Erin Hayes MRN: [id number] DOB: [birth year]  Today's TOC FU Call Status: Today's TOC FU Call Status:: Unsuccessful Call (2nd Attempt) Unsuccessful Call (1st Attempt) Date: 06/18/23 Unsuccessful Call (2nd Attempt) Date: 06/20/23  Attempted to reach the patient regarding the most recent Inpatient/ED visit.  Follow Up Plan: Additional outreach attempts will be made to reach the patient to complete the Transitions of Care (Post Inpatient/ED visit) call.   Signature Karena Addison, LPN [facility] Nurse Health Advisor Direct Dial [phone]

## 2023-06-24 NOTE — Transitions of Care (Post Inpatient/ED Visit) (Signed)
06/24/2023  Name: Erin Hayes MRN: [id number] DOB: 08/18/1965  Today's TOC FU Call Status: Today's TOC FU Call Status:: Successful TOC FU Call Completed Unsuccessful Call (1st Attempt) Date: 06/18/23 Unsuccessful Call (2nd Attempt) Date: 06/20/23 [facility] FU Call Complete Date: 06/24/23 Patient's Name and Date of Birth confirmed.  Transition Care Management Follow-up Telephone Call Date of Discharge: 06/17/23 Discharge Facility: [facility]) Type of Discharge: Emergency Department Reason for ED Visit: Other: (fx toe) How have you been since you were released from the hospital?: Better Any questions or concerns?: No  Items Reviewed: Did you receive and understand the discharge instructions provided?: Yes Medications obtained,verified, and reconciled?: Yes (Medications Reviewed) Any new allergies since your discharge?: No Dietary orders reviewed?: Yes Do you have support at home?: Yes People in Home: sibling(s)  Medications Reviewed Today: Medications Reviewed Today     Reviewed by Karena Addison, LPN (Licensed Practical Nurse) on 06/24/23 at 1512  Med List Status: <None>   Medication Order Taking? Sig Documenting Provider Last Dose Status Informant  albuterol (VENTOLIN HFA) 108 (90 Base) MCG/ACT inhaler [id number] No Inhale 1-2 puffs into the lungs every 6 (six) hours as needed for wheezing or shortness of breath. [provider] Taking Active Self, Pharmacy Records           Med Note Matthew Folks Apr 07, 2023 10:56 AM)    Blood Glucose Monitoring Suppl DEVI [id number] No 1 each by Does not apply route in the morning, at noon, and at bedtime. May substitute to any manufacturer covered by patient's insurance. Lorre Munroe, NP Taking Active   dapagliflozin propanediol (FARXIGA) 10 MG TABS tablet [id number] No Take 1 tablet (10 mg total) by mouth daily before breakfast. Delma Freeze, FNP Taking Active   furosemide (LASIX) 40 MG tablet  [id number] No Take 1 tablet (40 mg total) by mouth daily. Antonieta Iba, MD Taking Expired 06/17/23 2359 Self, Pharmacy Records           Med Note Matthew Folks Apr 07, 2023 10:56 AM)    glipiZIDE (GLIPIZIDE XL) 10 MG 24 hr tablet [id number] No Take 1 tablet (10 mg total) by mouth daily with breakfast. Lorre Munroe, NP Taking Active   hydrALAZINE (APRESOLINE) 25 MG tablet [id number] No Take 1 tablet (25 mg total) by mouth 3 (three) times daily. Antonieta Iba, MD Taking Active Self, Pharmacy Records           Med Note Matthew Folks Apr 07, 2023 10:56 AM)    indomethacin (INDOCIN) 50 MG capsule [id number]  Take 1 capsule (50 mg total) by mouth 3 (three) times daily with meals. Lorre Munroe, NP  Active   Lancets [facility] DELICA PLUS [place]) MISC [id number] No  [provider] Taking Active   loperamide HCl (IMODIUM) 1 MG/7.5ML solution [id number] No Take 2 mg by mouth 4 (four) times daily as needed for diarrhea or loose stools. [provider] Taking Active Self  metoprolol succinate (TOPROL-XL) 25 MG 24 hr tablet [id number] No Take 1 tablet (25 mg total) by mouth daily. Lorre Munroe, NP Taking Active Self, Pharmacy Records           Med Note Matthew Folks Apr 07, 2023 10:56 AM)    rivaroxaban (XARELTO) 20 MG TABS tablet [id number] No Take 1 tablet (20 mg total) by mouth daily with supper. Lorre Munroe, NP Taking Active  rosuvastatin (CRESTOR) 20 MG tablet [id number] No Take 1 tablet (20 mg total) by mouth daily. Lorre Munroe, NP Taking Active Self, Pharmacy Records           Med Note Matthew Folks Apr 07, 2023 10:56 AM)    sacubitril-valsartan (ENTRESTO) 97-103 MG [id number] No Take 1 tablet by mouth 2 (two) times daily. Lorre Munroe, NP Taking Active Self, Pharmacy Records           Med Note Truman Hayward   Sun Apr 07, 2023 10:56 AM)              Home Care and Equipment/Supplies: Were Home Health Services Ordered?: NA Any  new equipment or medical supplies ordered?: NA  Functional Questionnaire: Do you need assistance with bathing/showering or dressing?: No Do you need assistance with meal preparation?: No Do you need assistance with eating?: No Do you have difficulty maintaining continence: No Do you need assistance with getting out of bed/getting out of a chair/moving?: No Do you have difficulty managing or taking your medications?: No  Follow up appointments reviewed: PCP Follow-up appointment confirmed?: NA Specialist Hospital Follow-up appointment confirmed?: NA Do you need transportation to your follow-up appointment?: No Do you understand care options if your condition(s) worsen?: Yes-patient verbalized understanding    SIGNATURE Karena Addison, LPN [facility] Nurse Health Advisor Direct Dial [phone]

## 2023-07-03 ENCOUNTER — Telehealth: Payer: Self-pay | Admitting: Family

## 2023-07-03 NOTE — Progress Notes (Deleted)
 Advanced Heart Failure Clinic Note    PCP: Lorre Munroe, NP (last seen 01/25) Cardiologist: Julien Nordmann, MD (last seen 11/24)  Chief Complaint:   HPI:  Ms Knoll is a 58 y/o female with a history of stroke, HTN, hypokalemia, T1DM, CKD, hyperlipidemia, gout, DVT/ PE (2023) and chronic heart failure.   Admitted 11/28/22 due to extensive DVT and worsened HFrEF possibly due to old anterior infarct. Reports stress fracture diagnosed 8 weeks ago and relative immobilization. CT AP (11/28/22) with partially occlusive thrombus extending from the bilateral common femoral veins, iliac vein, and infrahepatic IVC. CTA PE (7/25) with no pulmonary embolism. Started on heparin gtt. Transitioned to Eliquis on 7/28. Hematology consulted and recommended monoclonal gammopathy workup and outpatient hypercoagulability workup. She will need lifelong anticoagulation. 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 Creatinine 2.43 up from 0.9 likely due to contrast nephropathy as she received contrasted studies 2 days prior. UA notable for high specific gravity and ketonuria. PVL renal artery duplex and veins showed no evidence of hemodynamically significant stenosis in the renal arteries. The resistive indices are also within normal limits so the IVC DVT extending into the renal vein is not contributing to the AKI per discussion with vascular surgery.   Admitted 04/06/23 due to acute onset of AMS that lasted about an hour. MRI of the brain showed small acute cortical infarct within the left insular, CT angiogram of the neck and head showed acute distal left M4 occlusion. CT angiogram of chest showed severe right sided pulmonary embolism with extension to multiple  upper lobes branches.  Duplex ultrasound showed extensive left lower extremity DVT. Patient has been seen by teleneurology, started on heparin drip. Patient is seen by vascular surgery, no need for intervention. She was also seen by hematology, she did not fail Eliquis treatment, she was not compliant. At this point, treatment will be changed to Xarelto. Echocardiogram has been performed, ejection fraction 20 to 25% without a PFO.   Echo 04/04/17: EF 20-25% with moderate LVH, Grade II DD, mild/ moderate MR Echo 09/25/17: EF 20-25% with Grade II DD, mild MR Echo 05/21/19: EF 45-50% with Grade I DD Echo 11/29/22: EF 25% with Grade I DD, no thrombus  Stress test 12/03/22: EF >60%, small, mild in severity, fixed perfusion defect involving the mid anteroseptal and mid inferoseptal segments, small to moderate (mostly posteriorly 1.4 cm) pericardial effusion  Was in the ED 06/17/23 due to left great toe pain thought to possibly be gout. Glucose found to be 399 so IVF given. Gout was ruled out.  She presents today for a HF follow-up visit with a chief complaint of   At last visit, farxiga 10mg  was started.   Previous cardiac studies:  LHC 04/05/17: There is severe left ventricular systolic dysfunction. LV end diastolic pressure is severely elevated. The left ventricular ejection fraction is less than 25% by visual estimate.  1.  Mild nonobstructive coronary artery disease. 2.  Severely reduced LV systolic function with an ejection fraction of 15-20% with global hypokinesis. 3.  Severely elevated left ventricular end-diastolic pressure at 33 mmHg.  Chest CTA 11/29/22: No PE, small/ moderate pleural effusion  ROS: All systems negative except as listed in HPI, PMH and Problem List.  SH:  Social History   Socioeconomic History   Marital  status: Single    Spouse name: Not on file   Number of children: 3   Years of education: 12   Highest education level: 12th grade  Occupational History    Occupation: home maker  Tobacco Use   Smoking status: Never   Smokeless tobacco: Never  Vaping Use   Vaping status: Never Used  Substance and Sexual Activity   Alcohol use: No   Drug use: No   Sexual activity: Never  Other Topics Concern   Not on file  Social History Narrative   Not on file   Social Drivers of Health   Financial Resource Strain: Low Risk  (04/08/2023)   Overall Financial Resource Strain (CARDIA)    Difficulty of Paying Living Expenses: Not hard at all  Food Insecurity: No Food Insecurity (04/08/2023)   Hunger Vital Sign    Worried About Running Out of Food in the Last Year: Never true    Ran Out of Food in the Last Year: Never true  Transportation Needs: No Transportation Needs (04/08/2023)   PRAPARE - Administrator, Civil Service (Medical): No    Lack of Transportation (Non-Medical): No  Physical Activity: Unknown (12/07/2022)   Exercise Vital Sign    Days of Exercise per Week: 2 days    Minutes of Exercise per Session: Not on file  Stress: No Stress Concern Present (12/07/2022)   [employer] of Occupational Health - Occupational Stress Questionnaire    Feeling of Stress : Not at all  Social Connections: Moderately Isolated (12/07/2022)   Social Connection and Isolation Panel [NHANES]    Frequency of Communication with Friends and Family: More than three times a week    Frequency of Social Gatherings with Friends and Family: More than three times a week    Attends Religious Services: More than 4 times per year    Active Member of [employer] or Organizations: No    Attends Banker Meetings: Never    Marital Status: Divorced  Catering manager Violence: Not At Risk (04/08/2023)   Humiliation, Afraid, Rape, and Kick questionnaire    Fear of Current or Ex-Partner: No    Emotionally Abused: No    Physically Abused: No    Sexually Abused: No    FH:  Family History  Problem Relation Age of Onset   Lung cancer Mother    Hypertension  Father    CAD Father        a. MI age 64   Heart disease Father    COPD Neg Hx    Diabetes Mellitus II Neg Hx     Past Medical History:  Diagnosis Date   Carpal tunnel syndrome of right wrist    Chronic combined systolic (congestive) and diastolic (congestive) heart failure (HCC)    a. 03/2017 Echo: EF 20-25%, diff HK, Gr2 DD; b. 09/2017 Echo: EF 20-25%, diff HK, Gr2 DD; c. 04/2020 Echo: EF 45-50%, mild LVH, antsept/basal ant HK, nl RV fxn; d. 11/2022 Echo: EF 25%, basal, mid & apical and antsept AK/DK.; e. 04/2023 Echo: EF 20-25%, mild conc LVH, GrI DD, nl RV fxn, small pericardial effusion.   Diabetes (HCC)    GERD (gastroesophageal reflux disease)    HLD (hyperlipidemia)    Hypertension    Lower leg DVT (deep venous thrombosis) (HCC)    NICM (nonischemic cardiomyopathy) (HCC)    a. 03/2017 Echo: EF 20-25%; b. 03/2017 Cath: mild nonobs dzs, EF 25%; c. 09/2017 Echo: EF 20-25%; d. 04/2020 Echo: EF  45-50%; e. 11/2022 Echo: EF 25%; f. 11/2022 MV: small, mild, fixed antsept/infsept defects, likely 2/2 LBBB->low risk; g. 04/2023 Echo: EF 20-25%.   Pleural effusion, left    a. 03/2017 s/p thoracentesis-->300 ml withdrawn--transudative.   Pneumonia 02/19/2017   Pulmonary embolism (HCC)    Stroke [facility])     Current Outpatient Medications  Medication Sig Dispense Refill   albuterol (VENTOLIN HFA) 108 (90 Base) MCG/ACT inhaler Inhale 1-2 puffs into the lungs every 6 (six) hours as needed for wheezing or shortness of breath.     Blood Glucose Monitoring Suppl DEVI 1 each by Does not apply route in the morning, at noon, and at bedtime. May substitute to any manufacturer covered by patient's insurance. 1 each 3   dapagliflozin propanediol (FARXIGA) 10 MG TABS tablet Take 1 tablet (10 mg total) by mouth daily before breakfast. 30 tablet 11   furosemide (LASIX) 40 MG tablet Take 1 tablet (40 mg total) by mouth daily. 90 tablet 3   glipiZIDE (GLIPIZIDE XL) 10 MG 24 hr tablet Take 1 tablet (10 mg total)  by mouth daily with breakfast. 90 tablet 0   hydrALAZINE (APRESOLINE) 25 MG tablet Take 1 tablet (25 mg total) by mouth 3 (three) times daily. 90 tablet 6   indomethacin (INDOCIN) 50 MG capsule Take 1 capsule (50 mg total) by mouth 3 (three) times daily with meals. 15 capsule 0   Lancets (ONETOUCH DELICA PLUS LANCET30G) MISC      loperamide HCl (IMODIUM) 1 MG/7.5ML solution Take 2 mg by mouth 4 (four) times daily as needed for diarrhea or loose stools.     metoprolol succinate (TOPROL-XL) 25 MG 24 hr tablet Take 1 tablet (25 mg total) by mouth daily. 90 tablet 1   rivaroxaban (XARELTO) 20 MG TABS tablet Take 1 tablet (20 mg total) by mouth daily with supper. 90 tablet 0   rosuvastatin (CRESTOR) 20 MG tablet Take 1 tablet (20 mg total) by mouth daily. 90 tablet 1   sacubitril-valsartan (ENTRESTO) 97-103 MG Take 1 tablet by mouth 2 (two) times daily. 180 tablet 1   No current facility-administered medications for this visit.      PHYSICAL EXAM:  General:  Well appearing. No resp difficulty HEENT: normal Neck: supple. JVP flat. Carotids 2+ bilaterally; no bruits. No lymphadenopathy or thryomegaly appreciated. Cor: PMI normal. Regular rate & rhythm. No rubs, gallops or murmurs. Lungs: clear Abdomen: soft, nontender, nondistended. No hepatosplenomegaly. No bruits or masses.  Extremities: no cyanosis, clubbing, rash, trace pitting edema bilateral lower legs, left shin/ foot tender to touch Neuro: alert & oriented x3, cranial nerves grossly intact. Moves all 4 extremities w/o difficulty. Affect pleasant.   ECG: not done   ASSESSMENT & PLAN:  1: NICM with reduced ejection fraction- - suspect due to HTN - NYHA class II - euvolemic - weighing daily; reminded to call for overnight weight gain of > 2 pounds or weekly weight gain of > 5 pounds - weight down 9 pounds from last visit here 1 month ago - Echo 04/04/17: EF 20-25% with moderate LVH, Grade II DD, mild/ moderate MR - Echo 09/25/17:  EF 20-25% with Grade II DD, mild MR - Echo 05/21/19: EF 45-50% with Grade I DD - Echo 11/29/22: EF 25% with Grade I DD, no thrombus - Stress test 12/03/22: EF >60%, small, mild in severity, fixed perfusion defect involving the mid anteroseptal and mid inferoseptal segments, small to moderate (mostly posteriorly 1.4 cm) pericardial effusion - continue furosemide 40mg  daily -  continue metoprolol succinate 25mg  daily - continue entresto 97/103mg  BID - continue farxiga 10mg  daily - BMET today - discussed adding MRA at next visit - BNP 11/30/22 was 84  2: HTN- - BP  - continue hydralazine 25mg  TID - saw PCP Sampson Si) 01/25 - BMP 04/15/23 showed sodium 137, potassium 4.2, creatinine 1.00 & GFR >60  3: Hyperlipidemia- - saw cardiology Mariah Milling) 11/24 - LDL 04/07/23 was 131 - continue rosuvastatin 20mg  daily  4: PE/DVT- - continue xarelto  20mg  daily   5: DM- - continue glipizide XL 10mg  daily; if glucose starts to get low with the addition of farxiga, may need to stop this - A1c 05/14/23 was 10.4%

## 2023-07-03 NOTE — Telephone Encounter (Signed)
 Pt confirmed appt for 07/04/23

## 2023-07-04 ENCOUNTER — Encounter: Payer: 59 | Admitting: Family

## 2023-07-10 ENCOUNTER — Telehealth: Payer: Self-pay | Admitting: Family

## 2023-07-10 NOTE — Telephone Encounter (Signed)
 Pt confirmed appt for 07/11/23

## 2023-07-10 NOTE — Progress Notes (Deleted)
 Advanced Heart Failure Clinic Note    PCP: Lorre Munroe, NP (last seen 01/25) Cardiologist: Julien Nordmann, MD (last seen 11/24)  Chief Complaint:   HPI:  Erin Hayes is a 58 y/o female with a history of stroke, HTN, hypokalemia, T1DM, CKD, hyperlipidemia, gout, DVT/ PE (2023) and chronic heart failure.   Admitted 11/28/22 due to extensive DVT and worsened HFrEF possibly due to old anterior infarct. Reports stress fracture diagnosed 8 weeks ago and relative immobilization. CT AP (11/28/22) with partially occlusive thrombus extending from the bilateral common femoral veins, iliac vein, and infrahepatic IVC. CTA PE (7/25) with no pulmonary embolism. Started on heparin gtt. Transitioned to Eliquis on 7/28. Hematology consulted and recommended monoclonal gammopathy workup and outpatient hypercoagulability workup. She will need lifelong anticoagulation. 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 Creatinine 2.43 up from 0.9 likely due to contrast nephropathy as she received contrasted studies 2 days prior. UA notable for high specific gravity and ketonuria. PVL renal artery duplex and veins showed no evidence of hemodynamically significant stenosis in the renal arteries. The resistive indices are also within normal limits so the IVC DVT extending into the renal vein is not contributing to the AKI per discussion with vascular surgery.   Admitted 04/06/23 due to acute onset of AMS that lasted about an hour. MRI of the brain showed small acute cortical infarct within the left insular, CT angiogram of the neck and head showed acute distal left M4 occlusion. CT angiogram of chest showed severe right sided pulmonary embolism with extension to multiple  upper lobes branches.  Duplex ultrasound showed extensive left lower extremity DVT. Patient has been seen by teleneurology, started on heparin drip. Patient is seen by vascular surgery, no need for intervention. She was also seen by hematology, she did not fail Eliquis treatment, she was not compliant. At this point, treatment will be changed to Xarelto. Echocardiogram has been performed, ejection fraction 20 to 25% without a PFO.   Echo 04/04/17: EF 20-25% with moderate LVH, Grade II DD, mild/ moderate MR Echo 09/25/17: EF 20-25% with Grade II DD, mild MR Echo 05/21/19: EF 45-50% with Grade I DD Echo 11/29/22: EF 25% with Grade I DD, no thrombus  Stress test 12/03/22: EF >60%, small, mild in severity, fixed perfusion defect involving the mid anteroseptal and mid inferoseptal segments, small to moderate (mostly posteriorly 1.4 cm) pericardial effusion  Was in the ED 06/17/23 due to left great toe pain thought to possibly be gout. Glucose found to be 399 so IVF given. Gout was ruled out.  She presents today for a HF follow-up visit with a chief complaint of   At last visit, farxiga 10mg  was started.   Previous cardiac studies:  LHC 04/05/17: There is severe left ventricular systolic dysfunction. LV end diastolic pressure is severely elevated. The left ventricular ejection fraction is less than 25% by visual estimate.  1.  Mild nonobstructive coronary artery disease. 2.  Severely reduced LV systolic function with an ejection fraction of 15-20% with global hypokinesis. 3.  Severely elevated left ventricular end-diastolic pressure at 33 mmHg.  Chest CTA 11/29/22: No PE, small/ moderate pleural effusion  ROS: All systems negative except as listed in HPI, PMH and Problem List.  SH:  Social History   Socioeconomic History   Marital  status: Single    Spouse name: Not on file   Number of children: 3   Years of education: 12   Highest education level: 12th grade  Occupational History    Occupation: home maker  Tobacco Use   Smoking status: Never   Smokeless tobacco: Never  Vaping Use   Vaping status: Never Used  Substance and Sexual Activity   Alcohol use: No   Drug use: No   Sexual activity: Never  Other Topics Concern   Not on file  Social History Narrative   Not on file   Social Drivers of Health   Financial Resource Strain: Low Risk  (04/08/2023)   Overall Financial Resource Strain (CARDIA)    Difficulty of Paying Living Expenses: Not hard at all  Food Insecurity: No Food Insecurity (04/08/2023)   Hunger Vital Sign    Worried About Running Out of Food in the Last Year: Never true    Ran Out of Food in the Last Year: Never true  Transportation Needs: No Transportation Needs (04/08/2023)   PRAPARE - Administrator, Civil Service (Medical): No    Lack of Transportation (Non-Medical): No  Physical Activity: Unknown (12/07/2022)   Exercise Vital Sign    Days of Exercise per Week: 2 days    Minutes of Exercise per Session: Not on file  Stress: No Stress Concern Present (12/07/2022)   [employer] of Occupational Health - Occupational Stress Questionnaire    Feeling of Stress : Not at all  Social Connections: Moderately Isolated (12/07/2022)   Social Connection and Isolation Panel [NHANES]    Frequency of Communication with Friends and Family: More than three times a week    Frequency of Social Gatherings with Friends and Family: More than three times a week    Attends Religious Services: More than 4 times per year    Active Member of [employer] or Organizations: No    Attends Banker Meetings: Never    Marital Status: Divorced  Catering manager Violence: Not At Risk (04/08/2023)   Humiliation, Afraid, Rape, and Kick questionnaire    Fear of Current or Ex-Partner: No    Emotionally Abused: No    Physically Abused: No    Sexually Abused: No    FH:  Family History  Problem Relation Age of Onset   Lung cancer Mother    Hypertension  Father    CAD Father        a. MI age 64   Heart disease Father    COPD Neg Hx    Diabetes Mellitus II Neg Hx     Past Medical History:  Diagnosis Date   Carpal tunnel syndrome of right wrist    Chronic combined systolic (congestive) and diastolic (congestive) heart failure (HCC)    a. 03/2017 Echo: EF 20-25%, diff HK, Gr2 DD; b. 09/2017 Echo: EF 20-25%, diff HK, Gr2 DD; c. 04/2020 Echo: EF 45-50%, mild LVH, antsept/basal ant HK, nl RV fxn; d. 11/2022 Echo: EF 25%, basal, mid & apical and antsept AK/DK.; e. 04/2023 Echo: EF 20-25%, mild conc LVH, GrI DD, nl RV fxn, small pericardial effusion.   Diabetes (HCC)    GERD (gastroesophageal reflux disease)    HLD (hyperlipidemia)    Hypertension    Lower leg DVT (deep venous thrombosis) (HCC)    NICM (nonischemic cardiomyopathy) (HCC)    a. 03/2017 Echo: EF 20-25%; b. 03/2017 Cath: mild nonobs dzs, EF 25%; c. 09/2017 Echo: EF 20-25%; d. 04/2020 Echo: EF  45-50%; e. 11/2022 Echo: EF 25%; f. 11/2022 MV: small, mild, fixed antsept/infsept defects, likely 2/2 LBBB->low risk; g. 04/2023 Echo: EF 20-25%.   Pleural effusion, left    a. 03/2017 s/p thoracentesis-->300 ml withdrawn--transudative.   Pneumonia 02/19/2017   Pulmonary embolism (HCC)    Stroke [facility])     Current Outpatient Medications  Medication Sig Dispense Refill   albuterol (VENTOLIN HFA) 108 (90 Base) MCG/ACT inhaler Inhale 1-2 puffs into the lungs every 6 (six) hours as needed for wheezing or shortness of breath.     Blood Glucose Monitoring Suppl DEVI 1 each by Does not apply route in the morning, at noon, and at bedtime. May substitute to any manufacturer covered by patient's insurance. 1 each 3   dapagliflozin propanediol (FARXIGA) 10 MG TABS tablet Take 1 tablet (10 mg total) by mouth daily before breakfast. 30 tablet 11   furosemide (LASIX) 40 MG tablet Take 1 tablet (40 mg total) by mouth daily. 90 tablet 3   glipiZIDE (GLIPIZIDE XL) 10 MG 24 hr tablet Take 1 tablet (10 mg total)  by mouth daily with breakfast. 90 tablet 0   hydrALAZINE (APRESOLINE) 25 MG tablet Take 1 tablet (25 mg total) by mouth 3 (three) times daily. 90 tablet 6   indomethacin (INDOCIN) 50 MG capsule Take 1 capsule (50 mg total) by mouth 3 (three) times daily with meals. 15 capsule 0   Lancets (ONETOUCH DELICA PLUS LANCET30G) MISC      loperamide HCl (IMODIUM) 1 MG/7.5ML solution Take 2 mg by mouth 4 (four) times daily as needed for diarrhea or loose stools.     metoprolol succinate (TOPROL-XL) 25 MG 24 hr tablet Take 1 tablet (25 mg total) by mouth daily. 90 tablet 1   rivaroxaban (XARELTO) 20 MG TABS tablet Take 1 tablet (20 mg total) by mouth daily with supper. 90 tablet 0   rosuvastatin (CRESTOR) 20 MG tablet Take 1 tablet (20 mg total) by mouth daily. 90 tablet 1   sacubitril-valsartan (ENTRESTO) 97-103 MG Take 1 tablet by mouth 2 (two) times daily. 180 tablet 1   No current facility-administered medications for this visit.      PHYSICAL EXAM:  General:  Well appearing. No resp difficulty HEENT: normal Neck: supple. JVP flat. Carotids 2+ bilaterally; no bruits. No lymphadenopathy or thryomegaly appreciated. Cor: PMI normal. Regular rate & rhythm. No rubs, gallops or murmurs. Lungs: clear Abdomen: soft, nontender, nondistended. No hepatosplenomegaly. No bruits or masses.  Extremities: no cyanosis, clubbing, rash, trace pitting edema bilateral lower legs, left shin/ foot tender to touch Neuro: alert & oriented x3, cranial nerves grossly intact. Moves all 4 extremities w/o difficulty. Affect pleasant.   ECG: not done   ASSESSMENT & PLAN:  1: NICM with reduced ejection fraction- - suspect due to HTN - NYHA class II - euvolemic - weighing daily; reminded to call for overnight weight gain of > 2 pounds or weekly weight gain of > 5 pounds - weight down 9 pounds from last visit here 1 month ago - Echo 04/04/17: EF 20-25% with moderate LVH, Grade II DD, mild/ moderate MR - Echo 09/25/17:  EF 20-25% with Grade II DD, mild MR - Echo 05/21/19: EF 45-50% with Grade I DD - Echo 11/29/22: EF 25% with Grade I DD, no thrombus - Stress test 12/03/22: EF >60%, small, mild in severity, fixed perfusion defect involving the mid anteroseptal and mid inferoseptal segments, small to moderate (mostly posteriorly 1.4 cm) pericardial effusion - continue furosemide 40mg  daily -  continue metoprolol succinate 25mg  daily - continue entresto 97/103mg  BID - continue farxiga 10mg  daily - BMET today - discussed adding MRA at next visit - BNP 11/30/22 was 84  2: HTN- - BP  - continue hydralazine 25mg  TID - saw PCP Sampson Si) 01/25 - BMP 04/15/23 showed sodium 137, potassium 4.2, creatinine 1.00 & GFR >60  3: Hyperlipidemia- - saw cardiology Mariah Milling) 11/24 - LDL 04/07/23 was 131 - continue rosuvastatin 20mg  daily  4: PE/DVT- - continue xarelto  20mg  daily   5: DM- - continue glipizide XL 10mg  daily; if glucose starts to get low with the addition of farxiga, may need to stop this - A1c 05/14/23 was 10.4%

## 2023-07-11 ENCOUNTER — Encounter: Payer: 59 | Admitting: Family

## 2023-07-17 ENCOUNTER — Other Ambulatory Visit: Payer: Self-pay | Admitting: Internal Medicine

## 2023-07-17 NOTE — Telephone Encounter (Signed)
 Requested Prescriptions  Pending Prescriptions Disp Refills   glipiZIDE (GLUCOTROL XL) 10 MG 24 hr tablet [Pharmacy Med Name: GLIPIZIDE ER 10 MG TABLET] 90 tablet 0    Sig: Take 1 tablet (10 mg total) by mouth daily with breakfast.     Endocrinology:  Diabetes - Sulfonylureas Failed - 07/17/2023  5:50 PM      Failed - HBA1C is between 0 and 7.9 and within 180 days    Hemoglobin A1C  Date Value Ref Range Status  05/14/2023 10.4 (A) 4.0 - 5.6 % Final   Hgb A1c MFr Bld  Date Value Ref Range Status  01/31/2023 7.8 (H) <5.7 % of total Hgb Final    Comment:    For someone without known diabetes, a hemoglobin A1c value of 6.5% or greater indicates that they may have  diabetes and this should be confirmed with a follow-up  test. . For someone with known diabetes, a value <7% indicates  that their diabetes is well controlled and a value  greater than or equal to 7% indicates suboptimal  control. A1c targets should be individualized based on  duration of diabetes, age, comorbid conditions, and  other considerations. . Currently, no consensus exists regarding use of hemoglobin A1c for diagnosis of diabetes for children. .          Failed - Cr in normal range and within 360 days    Creat  Date Value Ref Range Status  01/31/2023 0.81 0.50 - 1.03 mg/dL Final   Creatinine, Ser  Date Value Ref Range Status  06/17/2023 1.13 (H) 0.44 - 1.00 mg/dL Final   Creatinine, Urine  Date Value Ref Range Status  01/31/2023 190 20 - 275 mg/dL Final         Passed - Valid encounter within last 6 months    Recent Outpatient Visits           1 month ago Left foot pain   [place] [facility] [place], [place] W, NP   2 months ago Type 2 diabetes mellitus with other neurologic complication, with long-term current use of insulin [facility])   [place] [facility] [place], [place] W, NP   3 months ago Acute ischemic stroke [facility])   [place]  [place], Salvadore [place], NP   5 months ago Encounter for general adult medical examination with abnormal findings   [place] [facility] [place], [place], NP   7 months ago Recurrent acute deep vein thrombosis (DVT) of lower extremity, unspecified laterality [facility])   [place] [facility] [place], Salvadore Oxford, NP       Future Appointments             In 3 weeks Sampson Si, Salvadore Oxford, NP [facility], [facility]

## 2023-07-23 ENCOUNTER — Telehealth: Payer: Self-pay | Admitting: Family

## 2023-07-24 ENCOUNTER — Encounter: Payer: 59 | Admitting: Family

## 2023-07-24 NOTE — Progress Notes (Deleted)
 Advanced Heart Failure Clinic Note    PCP: Lorre Munroe, NP (last seen 01/25) Cardiologist: Julien Nordmann, MD (last seen 11/24)  Chief Complaint:   HPI:  Erin Hayes is a 58 y/o female with a history of stroke, HTN, hypokalemia, T1DM, CKD, hyperlipidemia, gout, DVT/ PE (2023) and chronic heart failure.   Admitted 11/28/22 due to extensive DVT and worsened HFrEF possibly due to old anterior infarct. Reports stress fracture diagnosed 8 weeks ago and relative immobilization. CT AP (11/28/22) with partially occlusive thrombus extending from the bilateral common femoral veins, iliac vein, and infrahepatic IVC. CTA PE (7/25) with no pulmonary embolism. Started on heparin gtt. Transitioned to Eliquis on 7/28. Hematology consulted and recommended monoclonal gammopathy workup and outpatient hypercoagulability workup. She will need lifelong anticoagulation. 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 Creatinine 2.43 up from 0.9 likely due to contrast nephropathy as she received contrasted studies 2 days prior. UA notable for high specific gravity and ketonuria. PVL renal artery duplex and veins showed no evidence of hemodynamically significant stenosis in the renal arteries. The resistive indices are also within normal limits so the IVC DVT extending into the renal vein is not contributing to the AKI per discussion with vascular surgery.   Admitted 04/06/23 due to acute onset of AMS that lasted about an hour. MRI of the brain showed small acute cortical infarct within the left insular, CT angiogram of the neck and head showed acute distal left M4 occlusion. CT angiogram of chest showed severe right sided pulmonary embolism with extension to multiple  upper lobes branches.  Duplex ultrasound showed extensive left lower extremity DVT. Patient has been seen by teleneurology, started on heparin drip. Patient is seen by vascular surgery, no need for intervention. She was also seen by hematology, she did not fail Eliquis treatment, she was not compliant. At this point, treatment will be changed to Xarelto. Echocardiogram has been performed, ejection fraction 20 to 25% without a PFO.   Echo 04/04/17: EF 20-25% with moderate LVH, Grade II DD, mild/ moderate MR Echo 09/25/17: EF 20-25% with Grade II DD, mild MR Echo 05/21/19: EF 45-50% with Grade I DD Echo 11/29/22: EF 25% with Grade I DD, no thrombus  Stress test 12/03/22: EF >60%, small, mild in severity, fixed perfusion defect involving the mid anteroseptal and mid inferoseptal segments, small to moderate (mostly posteriorly 1.4 cm) pericardial effusion  Was in the ED 06/17/23 due to left great toe pain thought to possibly be gout. Glucose found to be 399 so IVF given. Gout was ruled out.  She presents today for a HF follow-up visit with a chief complaint of   At last visit, farxiga 10mg  was started.   Previous cardiac studies:  LHC 04/05/17: There is severe left ventricular systolic dysfunction. LV end diastolic pressure is severely elevated. The left ventricular ejection fraction is less than 25% by visual estimate.  1.  Mild nonobstructive coronary artery disease. 2.  Severely reduced LV systolic function with an ejection fraction of 15-20% with global hypokinesis. 3.  Severely elevated left ventricular end-diastolic pressure at 33 mmHg.  Chest CTA 11/29/22: No PE, small/ moderate pleural effusion  ROS: All systems negative except as listed in HPI, PMH and Problem List.  SH:  Social History   Socioeconomic History   Marital  status: Single    Spouse name: Not on file   Number of children: 3   Years of education: 12   Highest education level: 12th grade  Occupational History    Occupation: home maker  Tobacco Use   Smoking status: Never   Smokeless tobacco: Never  Vaping Use   Vaping status: Never Used  Substance and Sexual Activity   Alcohol use: No   Drug use: No   Sexual activity: Never  Other Topics Concern   Not on file  Social History Narrative   Not on file   Social Drivers of Health   Financial Resource Strain: Low Risk  (04/08/2023)   Overall Financial Resource Strain (CARDIA)    Difficulty of Paying Living Expenses: Not hard at all  Food Insecurity: No Food Insecurity (04/08/2023)   Hunger Vital Sign    Worried About Running Out of Food in the Last Year: Never true    Ran Out of Food in the Last Year: Never true  Transportation Needs: No Transportation Needs (04/08/2023)   PRAPARE - Administrator, Civil Service (Medical): No    Lack of Transportation (Non-Medical): No  Physical Activity: Unknown (12/07/2022)   Exercise Vital Sign    Days of Exercise per Week: 2 days    Minutes of Exercise per Session: Not on file  Stress: No Stress Concern Present (12/07/2022)   [employer] of Occupational Health - Occupational Stress Questionnaire    Feeling of Stress : Not at all  Social Connections: Moderately Isolated (12/07/2022)   Social Connection and Isolation Panel [NHANES]    Frequency of Communication with Friends and Family: More than three times a week    Frequency of Social Gatherings with Friends and Family: More than three times a week    Attends Religious Services: More than 4 times per year    Active Member of [employer] or Organizations: No    Attends Banker Meetings: Never    Marital Status: Divorced  Catering manager Violence: Not At Risk (04/08/2023)   Humiliation, Afraid, Rape, and Kick questionnaire    Fear of Current or Ex-Partner: No    Emotionally Abused: No    Physically Abused: No    Sexually Abused: No    FH:  Family History  Problem Relation Age of Onset   Lung cancer Mother    Hypertension  Father    CAD Father        a. MI age 38   Heart disease Father    COPD Neg Hx    Diabetes Mellitus II Neg Hx     Past Medical History:  Diagnosis Date   Carpal tunnel syndrome of right wrist    Chronic combined systolic (congestive) and diastolic (congestive) heart failure (HCC)    a. 03/2017 Echo: EF 20-25%, diff HK, Gr2 DD; b. 09/2017 Echo: EF 20-25%, diff HK, Gr2 DD; c. 04/2020 Echo: EF 45-50%, mild LVH, antsept/basal ant HK, nl RV fxn; d. 11/2022 Echo: EF 25%, basal, mid & apical and antsept AK/DK.; e. 04/2023 Echo: EF 20-25%, mild conc LVH, GrI DD, nl RV fxn, small pericardial effusion.   Diabetes (HCC)    GERD (gastroesophageal reflux disease)    HLD (hyperlipidemia)    Hypertension    Lower leg DVT (deep venous thrombosis) (HCC)    NICM (nonischemic cardiomyopathy) (HCC)    a. 03/2017 Echo: EF 20-25%; b. 03/2017 Cath: mild nonobs dzs, EF 25%; c. 09/2017 Echo: EF 20-25%; d. 04/2020 Echo: EF  45-50%; e. 11/2022 Echo: EF 25%; f. 11/2022 MV: small, mild, fixed antsept/infsept defects, likely 2/2 LBBB->low risk; g. 04/2023 Echo: EF 20-25%.   Pleural effusion, left    a. 03/2017 s/p thoracentesis-->300 ml withdrawn--transudative.   Pneumonia 02/19/2017   Pulmonary embolism (HCC)    Stroke [facility])     Current Outpatient Medications  Medication Sig Dispense Refill   albuterol (VENTOLIN HFA) 108 (90 Base) MCG/ACT inhaler Inhale 1-2 puffs into the lungs every 6 (six) hours as needed for wheezing or shortness of breath.     Blood Glucose Monitoring Suppl DEVI 1 each by Does not apply route in the morning, at noon, and at bedtime. May substitute to any manufacturer covered by patient's insurance. 1 each 3   dapagliflozin propanediol (FARXIGA) 10 MG TABS tablet Take 1 tablet (10 mg total) by mouth daily before breakfast. 30 tablet 11   furosemide (LASIX) 40 MG tablet Take 1 tablet (40 mg total) by mouth daily. 90 tablet 3   glipiZIDE (GLUCOTROL XL) 10 MG 24 hr tablet Take 1 tablet (10 mg total)  by mouth daily with breakfast. 90 tablet 0   hydrALAZINE (APRESOLINE) 25 MG tablet Take 1 tablet (25 mg total) by mouth 3 (three) times daily. 90 tablet 6   indomethacin (INDOCIN) 50 MG capsule Take 1 capsule (50 mg total) by mouth 3 (three) times daily with meals. 15 capsule 0   Lancets (ONETOUCH DELICA PLUS LANCET30G) MISC      loperamide HCl (IMODIUM) 1 MG/7.5ML solution Take 2 mg by mouth 4 (four) times daily as needed for diarrhea or loose stools.     metoprolol succinate (TOPROL-XL) 25 MG 24 hr tablet Take 1 tablet (25 mg total) by mouth daily. 90 tablet 1   rivaroxaban (XARELTO) 20 MG TABS tablet Take 1 tablet (20 mg total) by mouth daily with supper. 90 tablet 0   rosuvastatin (CRESTOR) 20 MG tablet Take 1 tablet (20 mg total) by mouth daily. 90 tablet 1   sacubitril-valsartan (ENTRESTO) 97-103 MG Take 1 tablet by mouth 2 (two) times daily. 180 tablet 1   No current facility-administered medications for this visit.      PHYSICAL EXAM:  General:  Well appearing. No resp difficulty HEENT: normal Neck: supple. JVP flat. Carotids 2+ bilaterally; no bruits. No lymphadenopathy or thryomegaly appreciated. Cor: PMI normal. Regular rate & rhythm. No rubs, gallops or murmurs. Lungs: clear Abdomen: soft, nontender, nondistended. No hepatosplenomegaly. No bruits or masses.  Extremities: no cyanosis, clubbing, rash, trace pitting edema bilateral lower legs, left shin/ foot tender to touch Neuro: alert & oriented x3, cranial nerves grossly intact. Moves all 4 extremities w/o difficulty. Affect pleasant.   ECG: not done   ASSESSMENT & PLAN:  1: NICM with reduced ejection fraction- - suspect due to HTN - NYHA class II - euvolemic - weighing daily; reminded to call for overnight weight gain of > 2 pounds or weekly weight gain of > 5 pounds - weight down 9 pounds from last visit here 1 month ago - Echo 04/04/17: EF 20-25% with moderate LVH, Grade II DD, mild/ moderate MR - Echo 09/25/17:  EF 20-25% with Grade II DD, mild MR - Echo 05/21/19: EF 45-50% with Grade I DD - Echo 11/29/22: EF 25% with Grade I DD, no thrombus - Stress test 12/03/22: EF >60%, small, mild in severity, fixed perfusion defect involving the mid anteroseptal and mid inferoseptal segments, small to moderate (mostly posteriorly 1.4 cm) pericardial effusion - continue furosemide 40mg  daily -  continue metoprolol succinate 25mg  daily - continue entresto 97/103mg  BID - continue farxiga 10mg  daily - BMET today - discussed adding MRA at next visit - BNP 11/30/22 was 84  2: HTN- - BP  - continue hydralazine 25mg  TID - saw PCP Erin Hayes) 01/25 - BMP 04/15/23 showed sodium 137, potassium 4.2, creatinine 1.00 & GFR >60  3: Hyperlipidemia- - saw cardiology Erin Hayes) 11/24 - LDL 04/07/23 was 131 - continue rosuvastatin 20mg  daily  4: PE/DVT- - continue xarelto  20mg  daily   5: DM- - continue glipizide XL 10mg  daily; if glucose starts to get low with the addition of farxiga, may need to stop this - A1c 05/14/23 was 10.4%

## 2023-08-02 ENCOUNTER — Telehealth: Payer: Self-pay | Admitting: Family

## 2023-08-02 NOTE — Telephone Encounter (Incomplete)
 Called to confirm/remind patient of their appointment at the [facility] Clinic on 08/06/23***.   Appointment:   [x] Confirmed  [] Left mess   [] No answer/No voice mail  [] Phone not in service  Patient reminded to bring all medications and/or complete list.  Confirmed patient has transportation. Gave directions, instructed to utilize valet parking.

## 2023-08-05 ENCOUNTER — Telehealth: Payer: Self-pay | Admitting: Family

## 2023-08-05 NOTE — Telephone Encounter (Signed)
 Called to confirm/remind patient of their appointment at the [facility] on 08/05/23.   Appointment:   [x] Confirmed  [] Left mess   [] No answer/No voice mail  [] Phone not in service  Patient reminded to bring all medications and/or complete list.  Confirmed patient has transportation. Gave directions, instructed to utilize valet parking.

## 2023-08-06 ENCOUNTER — Other Ambulatory Visit
Admission: RE | Admit: 2023-08-06 | Discharge: 2023-08-06 | Disposition: A | Discharge status: Home / Self Care | Source: Ambulatory Visit | Attending: Family | Admitting: Family

## 2023-08-06 ENCOUNTER — Encounter: Payer: Self-pay | Admitting: Family

## 2023-08-06 ENCOUNTER — Ambulatory Visit (HOSPITAL_BASED_OUTPATIENT_CLINIC_OR_DEPARTMENT_OTHER): Admitting: Family

## 2023-08-06 VITALS — BP 130/95 | HR 74 | Wt 115.0 lb

## 2023-08-06 DIAGNOSIS — I5022 Chronic systolic (congestive) heart failure: Secondary | ICD-10-CM | POA: Diagnosis not present

## 2023-08-06 DIAGNOSIS — E782 Mixed hyperlipidemia: Secondary | ICD-10-CM

## 2023-08-06 DIAGNOSIS — I501 Left ventricular failure: Secondary | ICD-10-CM | POA: Diagnosis present

## 2023-08-06 DIAGNOSIS — E119 Type 2 diabetes mellitus without complications: Secondary | ICD-10-CM

## 2023-08-06 DIAGNOSIS — I825Y9 Chronic embolism and thrombosis of unspecified deep veins of unspecified proximal lower extremity: Secondary | ICD-10-CM | POA: Diagnosis not present

## 2023-08-06 DIAGNOSIS — I10 Essential (primary) hypertension: Secondary | ICD-10-CM

## 2023-08-06 LAB — CBC
HCT: 42.2 % (ref 36.0–46.0)
Hemoglobin: 13.8 g/dL (ref 12.0–15.0)
MCH: 27.8 pg (ref 26.0–34.0)
MCHC: 32.7 g/dL (ref 30.0–36.0)
MCV: 85.1 fL (ref 80.0–100.0)
Platelets: 261 10*3/uL (ref 150–400)
RBC: 4.96 MIL/uL (ref 3.87–5.11)
RDW: 12.7 % (ref 11.5–15.5)
WBC: 9 10*3/uL (ref 4.0–10.5)
nRBC: 0 % (ref 0.0–0.2)

## 2023-08-06 LAB — BASIC METABOLIC PANEL WITH GFR
Anion gap: 9 (ref 5–15)
BUN: 16 mg/dL (ref 6–20)
CO2: 23 mmol/L (ref 22–32)
Calcium: 8.9 mg/dL (ref 8.9–10.3)
Chloride: 101 mmol/L (ref 98–111)
Creatinine, Ser: 0.97 mg/dL (ref 0.44–1.00)
GFR, Estimated: >60 mL/min (ref >60)
Glucose, Bld: 315 mg/dL — ABNORMAL HIGH (ref 70–99)
Potassium: 3.9 mmol/L (ref 3.5–5.1)
Sodium: 133 mmol/L — ABNORMAL LOW (ref 135–145)

## 2023-08-06 NOTE — Patient Instructions (Addendum)
 Medication Changes:  No medication changes today!  Lab Work:   Go over to the [facility]ALL. Go pass the gift shop and have your blood work completed.  We will only call you if the results are abnormal or if the provider would like to make medication changes.    Testing/Procedures:  Your physician has requested that you have a cardiac MRI. Cardiac MRI uses a computer to create images of your heart as its beating, producing both still and moving pictures of your heart and major blood vessels. For further information please visit [email or web address]. Please follow the instruction sheet given to you today for more information.  You will be called in order to schedule this appointment.  Special Intructions: PLEASE BRING ALL OF YOUR MEDICATIONS TO YOUR APPOINTMENTS.  Follow-Up in: Please follow up with the Heart Failure Clinic in 1 month.  At the [facility] Clinic, you and your health needs are our priority. We have a designated team specialized in the treatment of Heart Failure. This Care Team includes your primary Heart Failure Specialized Cardiologist (physician), Advanced Practice Providers (APPs- Physician Assistants and Nurse Practitioners), and Pharmacist who all work together to provide you with the care you need, when you need it.   You may see any of the following providers on your designated Care Team at your next follow up:  Dr. Arvilla Meres Dr. Marca Ancona Dr. Dorthula Nettles Dr. Theresia Bough Clarisa Kindred, FNP Enos Fling, RPH-CPP  Please be sure to bring in all your medications bottles to every appointment.   Need to Contact us:  If you have any questions or concerns before your next appointment please send [place] a message through [place] or call our office at [phone].    TO LEAVE A MESSAGE FOR THE NURSE SELECT OPTION 2, PLEASE LEAVE A MESSAGE INCLUDING: YOUR NAME DATE OF BIRTH CALL BACK NUMBER REASON FOR CALL**this is important as we prioritize  the call backs  YOU WILL RECEIVE A CALL BACK THE SAME DAY AS LONG AS YOU CALL BEFORE 4:00 PM

## 2023-08-06 NOTE — Progress Notes (Signed)
 Advanced Heart Failure Clinic Note    PCP: Lorre Munroe, NP (last seen 01/25) Cardiologist: Julien Nordmann, MD (last seen 11/24)  Chief Complaint: fatigue  HPI:  Erin Hayes is a 58 y/o female with a history of stroke, HTN, hypokalemia, T1DM, CKD, hyperlipidemia, gout, DVT/ PE (2023) and chronic heart failure.   Echo 04/04/17: EF 20-25% with moderate LVH, Grade II DD, mild/ moderate MR Echo 09/25/17: EF 20-25% with Grade II DD, mild MR Echo 05/21/19: EF 45-50% with Grade I DD  Admitted 11/28/22 due to extensive DVT and worsened HFrEF possibly due to old anterior infarct. Reports stress fracture diagnosed 8 weeks ago and relative immobilization. CT AP (11/28/22) with partially occlusive thrombus extending from the bilateral common femoral veins, iliac vein, and infrahepatic IVC. CTA PE (7/25) with no pulmonary embolism. Started on heparin gtt. Transitioned to Eliquis on 7/28. Hematology consulted and recommended monoclonal gammopathy workup and outpatient hypercoagulability workup. She will need lifelong anticoagulation. 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 Creatinine 2.43 up from 0.9 likely due to contrast nephropathy as she received contrasted studies 2 days prior. UA notable for high specific gravity and ketonuria. PVL renal artery duplex and veins showed no evidence of hemodynamically significant stenosis in the renal arteries. The resistive indices are also within normal limits so the IVC DVT extending into the renal vein is not contributing to the AKI per discussion with vascular surgery.   Stress test 12/03/22: EF >60%, small, mild in severity, fixed perfusion defect involving the mid anteroseptal and mid inferoseptal  segments, small to moderate (mostly posteriorly 1.4 cm) pericardial effusion  Admitted 04/06/23 due to acute onset of AMS that lasted about an hour. MRI of the brain showed small acute cortical infarct within the left insular, CT angiogram of the neck and head showed acute distal left M4 occlusion. CT angiogram of chest showed severe right sided pulmonary embolism with extension to multiple upper lobes branches.  Duplex ultrasound showed extensive left lower extremity DVT. Patient has been seen by teleneurology, started on heparin drip. Patient is seen by vascular surgery, no need for intervention. She was also seen by hematology, she did not fail Eliquis treatment, she was not compliant. At this point, treatment will be changed to Xarelto. Echocardiogram has been performed, ejection fraction 20 to 25% without a PFO.   Was in the ED 06/17/23 due to left great toe pain thought to possibly be gout. Glucose found to be 399 so IVF given. Gout was ruled out.  She presents today for a HF follow-up visit with a chief complaint of minimal fatigue. Has no other symptoms and specifically denies any shortness of breath, chest pain, palpitations, abdominal distention, pedal edema, dizziness or difficulty sleeping.  At last visit, farxiga 10mg  was started & she denies any issues with taking this medication. She has all of her medications at home but did not take anything yet today (it's ~ 11:30am)   Previous cardiac studies:  LHC 04/05/17: There is severe left ventricular systolic dysfunction. LV end diastolic pressure is severely elevated. The left ventricular ejection fraction is less than 25% by visual estimate.  1.  Mild nonobstructive coronary artery disease. 2.  Severely reduced LV systolic function with an ejection fraction of 15-20% with global hypokinesis. 3.  Severely elevated left ventricular end-diastolic  pressure at 33 mmHg.  Chest CTA 11/29/22: No PE, small/ moderate pleural effusion  ROS: All  systems negative except as listed in HPI, PMH and Problem List.  SH:  Social History   Socioeconomic History   Marital status: Single    Spouse name: Not on file   Number of children: 3   Years of education: 12   Highest education level: 12th grade  Occupational History   Occupation: Arts development officer  Tobacco Use   Smoking status: Never   Smokeless tobacco: Never  Vaping Use   Vaping status: Never Used  Substance and Sexual Activity   Alcohol use: No   Drug use: No   Sexual activity: Never  Other Topics Concern   Not on file  Social History Narrative   Not on file   Social Drivers of Health   Financial Resource Strain: Low Risk  (04/08/2023)   Overall Financial Resource Strain (CARDIA)    Difficulty of Paying Living Expenses: Not hard at all  Food Insecurity: No Food Insecurity (04/08/2023)   Hunger Vital Sign    Worried About Running Out of Food in the Last Year: Never true    Ran Out of Food in the Last Year: Never true  Transportation Needs: No Transportation Needs (04/08/2023)   PRAPARE - Administrator, Civil Service (Medical): No    Lack of Transportation (Non-Medical): No  Physical Activity: Unknown (12/07/2022)   Exercise Vital Sign    Days of Exercise per Week: 2 days    Minutes of Exercise per Session: Not on file  Stress: No Stress Concern Present (12/07/2022)   [employer] of Occupational Health - Occupational Stress Questionnaire    Feeling of Stress : Not at all  Social Connections: Moderately Isolated (12/07/2022)   Social Connection and Isolation Panel [NHANES]    Frequency of Communication with Friends and Family: More than three times a week    Frequency of Social Gatherings with Friends and Family: More than three times a week    Attends Religious Services: More than 4 times per year    Active Member of [employer] or Organizations: No    Attends Banker Meetings: Never    Marital Status: Divorced  Catering manager Violence: Not At  Risk (04/08/2023)   Humiliation, Afraid, Rape, and Kick questionnaire    Fear of Current or Ex-Partner: No    Emotionally Abused: No    Physically Abused: No    Sexually Abused: No    FH:  Family History  Problem Relation Age of Onset   Lung cancer Mother    Hypertension Father    CAD Father        a. MI age 21   Heart disease Father    COPD Neg Hx    Diabetes Mellitus II Neg Hx     Past Medical History:  Diagnosis Date   Carpal tunnel syndrome of right wrist    Chronic combined systolic (congestive) and diastolic (congestive) heart failure (HCC)    a. 03/2017 Echo: EF 20-25%, diff HK, Gr2 DD; b. 09/2017 Echo: EF 20-25%, diff HK, Gr2 DD; c. 04/2020 Echo: EF 45-50%, mild LVH, antsept/basal ant HK, nl RV fxn; d. 11/2022 Echo: EF 25%, basal, mid & apical and antsept AK/DK.; e. 04/2023 Echo: EF 20-25%, mild conc LVH, GrI DD, nl RV fxn, small pericardial effusion.   Diabetes (HCC)    GERD (gastroesophageal reflux disease)    HLD (hyperlipidemia)    Hypertension  Lower leg DVT (deep venous thrombosis) (HCC)    NICM (nonischemic cardiomyopathy) (HCC)    a. 03/2017 Echo: EF 20-25%; b. 03/2017 Cath: mild nonobs dzs, EF 25%; c. 09/2017 Echo: EF 20-25%; d. 04/2020 Echo: EF 45-50%; e. 11/2022 Echo: EF 25%; f. 11/2022 MV: small, mild, fixed antsept/infsept defects, likely 2/2 LBBB->low risk; g. 04/2023 Echo: EF 20-25%.   Pleural effusion, left    a. 03/2017 s/p thoracentesis-->300 ml withdrawn--transudative.   Pneumonia 02/19/2017   Pulmonary embolism (HCC)    Stroke [facility])     Current Outpatient Medications  Medication Sig Dispense Refill   albuterol (VENTOLIN HFA) 108 (90 Base) MCG/ACT inhaler Inhale 1-2 puffs into the lungs every 6 (six) hours as needed for wheezing or shortness of breath.     Blood Glucose Monitoring Suppl DEVI 1 each by Does not apply route in the morning, at noon, and at bedtime. May substitute to any manufacturer covered by patient's insurance. 1 each 3    dapagliflozin propanediol (FARXIGA) 10 MG TABS tablet Take 1 tablet (10 mg total) by mouth daily before breakfast. 30 tablet 11   furosemide (LASIX) 40 MG tablet Take 1 tablet (40 mg total) by mouth daily. 90 tablet 3   glipiZIDE (GLUCOTROL XL) 10 MG 24 hr tablet Take 1 tablet (10 mg total) by mouth daily with breakfast. 90 tablet 0   hydrALAZINE (APRESOLINE) 25 MG tablet Take 1 tablet (25 mg total) by mouth 3 (three) times daily. 90 tablet 6   indomethacin (INDOCIN) 50 MG capsule Take 1 capsule (50 mg total) by mouth 3 (three) times daily with meals. 15 capsule 0   Lancets (ONETOUCH DELICA PLUS LANCET30G) MISC      loperamide HCl (IMODIUM) 1 MG/7.5ML solution Take 2 mg by mouth 4 (four) times daily as needed for diarrhea or loose stools.     metoprolol succinate (TOPROL-XL) 25 MG 24 hr tablet Take 1 tablet (25 mg total) by mouth daily. 90 tablet 1   rivaroxaban (XARELTO) 20 MG TABS tablet Take 1 tablet (20 mg total) by mouth daily with supper. 90 tablet 0   rosuvastatin (CRESTOR) 20 MG tablet Take 1 tablet (20 mg total) by mouth daily. 90 tablet 1   sacubitril-valsartan (ENTRESTO) 97-103 MG Take 1 tablet by mouth 2 (two) times daily. 180 tablet 1   No current facility-administered medications for this visit.   Vitals:   08/06/23 1137  BP: (!) 130/95  Pulse: 74  SpO2: 95%  Weight: 115 lb (52.2 kg)   Wt Readings from Last 3 Encounters:  08/06/23 115 lb (52.2 kg)  05/31/23 115 lb (52.2 kg)  05/30/23 115 lb (52.2 kg)   Lab Results  Component Value Date   CREATININE 1.13 (H) 06/17/2023   CREATININE 1.00 04/15/2023   CREATININE 1.27 (H) 04/09/2023    PHYSICAL EXAM:  General: Well appearing. No resp difficulty HEENT: normal Neck: supple, no JVD Cor: Regular rhythm, rate. No rubs, gallops or murmurs Lungs: clear Abdomen: soft, nontender, nondistended. Extremities: no cyanosis, clubbing, rash, trace pitting edema around bilateral ankles Neuro: alert & oriented X 3. Moves all 4  extremities w/o difficulty. Affect pleasant   ECG: not done   ASSESSMENT & PLAN:  1: NICM with reduced ejection fraction- - suspect due to HTN - NYHA class II - euvolemic - weighing daily; reminded to call for overnight weight gain of > 2 pounds or weekly weight gain of > 5 pounds - weight unchanged from last visit here 2 months ago -  Echo 04/04/17: EF 20-25% with moderate LVH, Grade II DD, mild/ moderate MR - Echo 09/25/17: EF 20-25% with Grade II DD, mild MR - Echo 05/21/19: EF 45-50% with Grade I DD - Echo 11/29/22: EF 25% with Grade I DD, no thrombus - Stress test 12/03/22: EF >60%, small, mild in severity, fixed perfusion defect involving the mid anteroseptal and mid inferoseptal segments, small to moderate (mostly posteriorly 1.4 cm) pericardial effusion - continue farxiga 10mg  daily - continue furosemide 40mg  daily - continue hydralazine 25mg  TID - continue metoprolol succinate 25mg  daily - continue entresto 97/103mg  BID - consider spiro if patient is consistent with f/u visits - cMRI to check for amyloid as daughter also has HF; patient had + serum light chains 07/24 - consider EP  - BMET today - BNP 11/30/22 was 84  2: HTN- - BP 130/95 - continue hydralazine 25mg  TID - saw PCP Sampson Si) 01/25 - BMP 06/17/23 reviewed and showed sodium 137, potassium 3.3, creatinine 1.13 & GFR 56 - BMET/ CBC today  3: Hyperlipidemia- - saw cardiology Mariah Milling) 11/24 - LDL 04/07/23 was 131 - continue rosuvastatin 20mg  daily  4: PE/DVT- - continue xarelto  20mg  daily   5: DM- - continue glipizide XL 10mg  dail - A1c 05/14/23 was 10.4%   Return in 1 month, sooner if needed.   Erin Hayes, [place] 08/06/23

## 2023-08-06 NOTE — Progress Notes (Signed)
 [facility] - HEART FAILURE CLINIC - PHARMACIST COUNSELING NOTE  Adherence Assessment  Do you ever forget to take your medication? [] Yes [x] No  Do you ever skip doses due to side effects? [] Yes [x] No  Do you have trouble affording your medicines? [] Yes [x] No  Are you ever unable to pick up your medication due to transportation difficulties? [] Yes [x] No  Do you ever stop taking your medications because you don't believe they are helping? [] Yes [x] No  Do you check your weight daily?  [x] Yes [] No  Do you check your blood pressure daily? [x] Yes [] No  Adherence strategy: Pill box  Barriers to obtaining medications: None reported   Vital signs: HR 74, BP 130/95, weigh 115 lbs.  ECHO: Date 04/07/23, EF 20-25% Renal function: Date 06/17/23, GFR 56  Current Guideline-Directed Medical Therapy/Evidence Based Medicine  ACE/ARB/ARNI: Sacubitril-valsartan 97-103 mg twice daily Target dose: On target dose   Beta Blocker: Metoprolol succinate 25 mg daily Target dose: 200 mg daily   Aldosterone Antagonist:  N/A Target dose: N/A  SGLT2i: Dapagliflozin 10 mg daily Target dose: On target dose   Diuretic: Furosemide 40 mg daily  ASSESSMENT 58 year old female who presents to the HF clinic for a follow-up visit. PMH is significant for history of stroke, HTN, hypokalemia, CKD, hyperlipidemia, gout, DVT/ PE (2023) and chronic heart failure. Patient reports that a BP reading of 130/95 is a normal reading for her. She has not taken any of her medications yet this morning.   Recent ED visit or hospitalization (past 6 months):  Date: 06/17/23, CC: flank & foot pain Date: 04/06/23 - 04/09/23, CC: abdominal pain, Admission Dx: acute ischemic stroke, afib, T2DM  PLAN Recommendations discussed with NP Consider initiation of spironolactone at next visit  Consider titration of beta-blocker to target dose if/when able to tolerate  Annual ECHO due in December   Time spent: 20  minutes   Littie Deeds, PharmD Pharmacy Resident  08/06/2023 12:04 PM

## 2023-08-12 ENCOUNTER — Ambulatory Visit: Payer: Self-pay | Admitting: Internal Medicine

## 2023-08-12 ENCOUNTER — Other Ambulatory Visit (INDEPENDENT_AMBULATORY_CARE_PROVIDER_SITE_OTHER): Admitting: Pharmacist

## 2023-08-12 ENCOUNTER — Other Ambulatory Visit: Payer: Self-pay | Admitting: Internal Medicine

## 2023-08-12 DIAGNOSIS — I5022 Chronic systolic (congestive) heart failure: Secondary | ICD-10-CM

## 2023-08-12 DIAGNOSIS — I10 Essential (primary) hypertension: Secondary | ICD-10-CM

## 2023-08-12 DIAGNOSIS — E1149 Type 2 diabetes mellitus with other diabetic neurological complication: Secondary | ICD-10-CM

## 2023-08-12 DIAGNOSIS — E119 Type 2 diabetes mellitus without complications: Secondary | ICD-10-CM

## 2023-08-12 DIAGNOSIS — Z794 Long term (current) use of insulin: Secondary | ICD-10-CM

## 2023-08-12 NOTE — Patient Instructions (Signed)
Goals Addressed             This Visit's Progress    Pharmacy Goals       The goal A1c is less than 7%. This is the best way to reduce the risk of the long term complications of diabetes, including heart disease, kidney disease, eye disease, strokes, and nerve damage. An A1c of less than 7% corresponds with fasting sugars less than 130 and 2 hour after meal sugars less than 180.   Check your blood pressure twice weekly, and any time you have concerning symptoms like headache, chest pain, dizziness, shortness of breath, or vision changes.   Our goal is less than 130/80.  To appropriately check your blood pressure, make sure you do the following:  1) Avoid caffeine, exercise, or tobacco products for 30 minutes before checking. Empty your bladder. 2) Sit with your back supported in a flat-backed chair. Rest your arm on something flat (arm of the chair, table, etc). 3) Sit still with your feet flat on the floor, resting, for at least 5 minutes.  4) Check your blood pressure. Take 1-2 readings.  5) Write down these readings and bring with you to any provider appointments.  Bring your home blood pressure machine with you to a provider's office for accuracy comparison at least once a year.   Make sure you take your blood pressure medications before you come to any office visit, even if you were asked to fast for labs.   Estelle Grumbles, PharmD, [facility] [phone]

## 2023-08-12 NOTE — Progress Notes (Signed)
 08/12/2023 Name: Erin Hayes MRN: [id number] DOB: Sep 22, 1965  Chief Complaint  Patient presents with   Medication Management   Medication Adherence    Erin Hayes is a 58 y.o. year old female who presented for a telephone visit.   They were referred to the pharmacist by their PCP for assistance in managing diabetes and hypertension.    Subjective:  Care Team: Primary Care Provider: Lorre Munroe, NP  Heart Failure Specialist: Delma Freeze, FNP; Next Scheduled Visit: 09/10/2023   Medication Access/Adherence  Current Pharmacy:  [facility] - [place], [place] - [address] [place] [address] Phone: [phone] Fax: [phone]  [facility] [address] [place] [address] Phone: [phone] Fax: [phone]   Patient reports affordability concerns with their medications: No  Patient reports access/transportation concerns to their pharmacy: No  Patient reports adherence concerns with their medications:  Yes    Reports uses weekly pillbox to organize her medications; reports rarely misses a dose  Diabetes:  Current medications:  - Farxiga 10 mg daily - glipizide ER 10 mg daily with breakfast  Previous therapies tried: Lantus, Bydureon, metformin  Denies checking home blood sugar recently  Current meal patterns:  - Breakfast: eggs and toast or waffle - Lunch: sometimes skips - Supper: subway sandwich - Snacks: chips, yogurt - Drinks: juice, water and sometime Coke  Statin therapy: rosuvastatin 20 mg daily  Hypertension/Heart Failure:  Current medications:  ACEi/ARB/ARNI: Entresto 97-103 mg twice daily SGLT2i: Farxiga 10 mg daily Beta blocker: metoprolol ER 25 mg daily - identify patient not currently taking as ran out  Diuretic regimen: furosemide 40 mg daily Hydralazine 25 mg three times daily  Current home blood pressure readings: recalls last checked yesterday, reading  128/98  Denies symptoms of hypotension such as dizziness or lightheadedness    Objective:  Lab Results  Component Value Date   HGBA1C 10.4 (A) 05/14/2023    Lab Results  Component Value Date   CREATININE 0.97 08/06/2023   BUN 16 08/06/2023   NA 133 (L) 08/06/2023   K 3.9 08/06/2023   CL 101 08/06/2023   CO2 23 08/06/2023    Lab Results  Component Value Date   CHOL 217 (H) 04/07/2023   HDL 65 04/07/2023   LDLCALC 131 (H) 04/07/2023   TRIG 106 04/07/2023   CHOLHDL 3.3 04/07/2023   BP Readings from Last 3 Encounters:  08/06/23 (!) 130/95  06/17/23 130/69  05/31/23 130/88   Pulse Readings from Last 3 Encounters:  08/06/23 74  06/17/23 88  05/30/23 81     Medications Reviewed Today     Reviewed by Manuela Neptune, RPH-CPP (Pharmacist) on 08/12/23 at 1509  Med List Status: <None>   Medication Order Taking? Sig Documenting Provider Last Dose Status Informant  albuterol (VENTOLIN HFA) 108 (90 Base) MCG/ACT inhaler [id number] Yes Inhale 1-2 puffs into the lungs every 6 (six) hours as needed for wheezing or shortness of breath. [provider] Taking Active Self, Pharmacy Records           Med Note Matthew Folks Apr 07, 2023 10:56 AM)    Blood Glucose Monitoring Suppl DEVI [id number]  1 each by Does not apply route in the morning, at noon, and at bedtime. May substitute to any manufacturer covered by patient's insurance. Lorre Munroe, NP  Active   dapagliflozin propanediol (FARXIGA) 10 MG TABS  tablet [id number] Yes Take 1 tablet (10 mg total) by mouth daily before breakfast. Delma Freeze, FNP Taking Active   furosemide (LASIX) 40 MG tablet [id number] Yes Take 1 tablet (40 mg total) by mouth daily. Antonieta Iba, MD Taking Active Self, Pharmacy Records           Med Note Matthew Folks Apr 07, 2023 10:56 AM)    glipiZIDE (GLUCOTROL XL) 10 MG 24 hr tablet [id number] Yes Take 1 tablet (10 mg total) by mouth daily with breakfast. Lorre Munroe, NP Taking Active   hydrALAZINE (APRESOLINE) 25 MG tablet [id number] Yes Take 1 tablet (25 mg total) by mouth 3 (three) times daily. Antonieta Iba, MD Taking Active Self, Pharmacy Records           Med Note Matthew Folks Apr 07, 2023 10:56 AM)    Lancets [facility] [place]) [place] [id number]   [provider]  Active   loperamide HCl (IMODIUM) 1 MG/7.5ML solution [id number] Yes Take 2 mg by mouth 4 (four) times daily as needed for diarrhea or loose stools. [provider] Taking Active Self  metoprolol succinate (TOPROL-XL) 25 MG 24 hr tablet [id number]  Take 1 tablet (25 mg total) by mouth daily. Lorre Munroe, NP  Active Self, Pharmacy Records           Med Note Truman Hayward   Sun Apr 07, 2023 10:56 AM)    rivaroxaban (XARELTO) 20 MG TABS tablet [id number] Yes Take 1 tablet (20 mg total) by mouth daily with supper. Lorre Munroe, NP Taking Active   rosuvastatin (CRESTOR) 20 MG tablet [id number] Yes Take 1 tablet (20 mg total) by mouth daily. Lorre Munroe, NP Taking Active Self, Pharmacy Records           Med Note Matthew Folks Apr 07, 2023 10:56 AM)    sacubitril-valsartan [facility]) 97-103 MG [id number] Yes Take 1 tablet by mouth 2 (two) times daily. Lorre Munroe, NP Taking Active Self, Pharmacy Records           Med Note Katrinka Blazing, Littie Deeds Apr 07, 2023 10:56 AM)                Assessment/Plan:   Encourage patient to contact office to reschedule missed appointment with PCP from this morning  Encourage patient to continue to use weekly pillbox to organize her medications and to also use medication list from latest office visit when refilling pillbox as adherence aid  Diabetes: - Currently uncontrolled - Reviewed long term cardiovascular and renal outcomes of uncontrolled blood sugar - Reviewed goal A1c, goal fasting, and goal 2 hour post prandial glucose Reviewed dietary modifications including importance of having regular  well-balanced meals and snacks throughout the day, while controlling carbohydrate portion sizes             Encourage patient to avoid consumption of sugary beverages             Encourage patient to increase consumption of non-starchy vegetables - Recommend to restart checking glucose, keep log of results and have this record to review at upcoming medical appointments. Patient to contact provider office sooner if needed for readings outside of established parameters or symptoms   Hypertension/Heart Failure: - Identify patient has run out of metoprolol ER. Patient to contact pharmacy to request refills of metoprolol ER, Farxiga, Xarelto and Entresto - Recommend to monitor home blood pressure,  keep log of results and have this record to review at upcoming medical appointments. Patient to contact provider office sooner if needed for readings outside of established parameters or symptoms   Follow Up Plan: Clinical Pharmacist will follow up with patient by telephone on 09/09/2023 at 2:00 PM   Estelle Grumbles, PharmD, [facility] Clinical Pharmacist [facility] [phone]

## 2023-08-13 NOTE — Telephone Encounter (Signed)
 Requested Prescriptions  Pending Prescriptions Disp Refills   rosuvastatin (CRESTOR) 20 MG tablet [Pharmacy Med Name: ROSUVASTATIN CALCIUM 20 MG TAB] 90 tablet 0    Sig: Take 1 tablet (20 mg total) by mouth daily.     Cardiovascular:  Antilipid - Statins 2 Failed - 08/13/2023  3:16 PM      Failed - Valid encounter within last 12 months    Recent Outpatient Visits   None            Failed - Lipid Panel in normal range within the last 12 months    Cholesterol, Total  Date Value Ref Range Status  06/19/2018 216 (H) 100 - 199 mg/dL Final   Cholesterol  Date Value Ref Range Status  04/07/2023 217 (H) 0 - 200 mg/dL Final   LDL Cholesterol (Calc)  Date Value Ref Range Status  01/31/2023 79 mg/dL (calc) Final    Comment:    Reference range: <100 . Desirable range <100 mg/dL for primary prevention;   <70 mg/dL for patients with CHD or diabetic patients  with > or = 2 CHD risk factors. [name] LDL-C is now calculated using the Martin-Hopkins  calculation, which is a validated novel method providing  better accuracy than the Friedewald equation in the  estimation of LDL-C.  Horald Pollen et al. Lenox Ahr. 4401;027(25): 2061-2068  (http://education.[facility].com/faq/FAQ164)    LDL Cholesterol  Date Value Ref Range Status  04/07/2023 131 (H) 0 - 99 mg/dL Final    Comment:           Total Cholesterol/HDL:CHD Risk Coronary Heart Disease Risk Table                     Men   Women  1/2 Average Risk   3.4   3.3  Average Risk       5.0   4.4  2 X Average Risk   9.6   7.1  3 X Average Risk  23.4   11.0        Use the calculated Patient Ratio above and the CHD Risk Table to determine the patient's CHD Risk.        ATP III CLASSIFICATION (LDL):  <100     mg/dL   Optimal  366-440  mg/dL   Near or Above                    Optimal  130-159  mg/dL   Borderline  347-425  mg/dL   High  >956     mg/dL   Very High Performed at [facility], [address] [place], [place]  [address]    HDL  Date Value Ref Range Status  04/07/2023 65 >40 mg/dL Final  43/32/9518 53 >84 mg/dL Final   Triglycerides  Date Value Ref Range Status  04/07/2023 106 <150 mg/dL Final         Passed - Cr in normal range and within 360 days    Creat  Date Value Ref Range Status  01/31/2023 0.81 0.50 - 1.03 mg/dL Final   Creatinine, Ser  Date Value Ref Range Status  08/06/2023 0.97 0.44 - 1.00 mg/dL Final   Creatinine, Urine  Date Value Ref Range Status  01/31/2023 190 20 - 275 mg/dL Final         Passed - Patient is not pregnant

## 2023-09-09 ENCOUNTER — Telehealth: Payer: Self-pay | Admitting: Family

## 2023-09-09 ENCOUNTER — Telehealth: Payer: Self-pay | Admitting: Pharmacist

## 2023-09-09 ENCOUNTER — Other Ambulatory Visit: Admitting: Pharmacist

## 2023-09-09 NOTE — Telephone Encounter (Signed)
 Called to confirm/remind patient of their appointment at the [facility] on 09/10/23.   Appointment:   [x] Confirmed  [] Left mess   [] No answer/No voice mail  [] VM Full/unable to leave message  [] Phone not in service  Patient reminded to bring all medications and/or complete list.  Confirmed patient has transportation. Gave directions, instructed to utilize valet parking.

## 2023-09-09 NOTE — Progress Notes (Unsigned)
 [facility] Note    PCP: Carollynn Cirri, NP (last seen 01/25) Cardiologist: Timothy Gollan, MD (last seen 11/24)  Chief Complaint: fatigue  HPI:  Erin Hayes is a 58 y/o female with a history of stroke, HTN, hypokalemia, T1DM, CKD, hyperlipidemia, gout, DVT/ PE (2023) and chronic heart failure.   Echo 04/04/17: EF 20-25% with moderate LVH, Grade II DD, mild/ moderate MR Echo 09/25/17: EF 20-25% with Grade II DD, mild MR Echo 05/21/19: EF 45-50% with Grade I DD  Admitted 11/28/22 due to extensive DVT and worsened HFrEF possibly due to old anterior infarct. Reports stress fracture diagnosed 8 weeks ago and relative immobilization. CT AP (11/28/22) with partially occlusive thrombus extending from the bilateral common femoral veins, iliac vein, and infrahepatic IVC. CTA PE (7/25) with no pulmonary embolism. Started on heparin  gtt. Transitioned to Eliquis  on 7/28. Hematology consulted and recommended monoclonal gammopathy workup and outpatient hypercoagulability workup. She will need lifelong anticoagulation. 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 Creatinine 2.43 up from 0.9 likely due to contrast nephropathy as she received contrasted studies 2 days prior. UA notable for high specific gravity and ketonuria. PVL renal artery duplex and veins showed no evidence of hemodynamically significant stenosis in the renal arteries. The resistive indices are also within normal limits so the IVC DVT extending into the renal vein is not contributing to the AKI per discussion with vascular surgery.   Stress test 12/03/22: EF >60%, small, mild in severity, fixed perfusion defect involving the mid anteroseptal and mid inferoseptal  segments, small to moderate (mostly posteriorly 1.4 cm) pericardial effusion  Admitted 04/06/23 due to acute onset of AMS that lasted about an hour. MRI of the brain showed small acute cortical infarct within the left insular, CT angiogram of the neck and head showed acute distal left M4 occlusion. CT angiogram of chest showed severe right sided pulmonary embolism with extension to multiple upper lobes branches.  Duplex ultrasound showed extensive left lower extremity DVT. Patient has been seen by teleneurology, started on heparin  drip. Patient is seen by vascular surgery, no need for intervention. She was also seen by hematology, she did not fail Eliquis  treatment, she was not compliant. At this point, treatment will be changed to Xarelto . Echocardiogram has been performed, ejection fraction 20 to 25% without a PFO.   Was in the ED 06/17/23 due to left great toe pain thought to possibly be gout. Glucose found to be 399 so IVF given. Gout was ruled out.  She presents today for a HF follow-up visit with a chief complaint of minimal fatigue. Has no other symptoms and specifically denies any shortness of breath, chest pain, palpitations, abdominal distention, pedal edema, dizziness or difficulty sleeping.  At last visit, farxiga  10mg  was started & she denies any issues with taking this medication. She has all of her medications at home but did not take anything yet today (it's ~ 11:30am)   Previous cardiac studies:  LHC 04/05/17: There is severe left ventricular systolic dysfunction. LV end diastolic pressure is severely elevated. The left ventricular ejection fraction is less than 25% by visual estimate.  1.  Mild nonobstructive coronary artery disease. 2.  Severely reduced LV systolic function with an ejection fraction of 15-20% with global hypokinesis. 3.  Severely elevated left ventricular end-diastolic  pressure at 33 mmHg.  Chest CTA 11/29/22: No PE, small/ moderate pleural effusion  ROS: All  systems negative except as listed in HPI, PMH and Problem List.  SH:  Social History   Socioeconomic History   Marital status: Single    Spouse name: Not on file   Number of children: 3   Years of education: 12   Highest education level: 12th grade  Occupational History   Occupation: Arts development officer  Tobacco Use   Smoking status: Never   Smokeless tobacco: Never  Vaping Use   Vaping status: Never Used  Substance and Sexual Activity   Alcohol use: No   Drug use: No   Sexual activity: Never  Other Topics Concern   Not on file  Social History Narrative   Not on file   Social Drivers of Health   Financial Resource Strain: Low Risk  (04/08/2023)   Overall Financial Resource Strain (CARDIA)    Difficulty of Paying Living Expenses: Not hard at all  Food Insecurity: No Food Insecurity (04/08/2023)   Hunger Vital Sign    Worried About Running Out of Food in the Last Year: Never true    Ran Out of Food in the Last Year: Never true  Transportation Needs: No Transportation Needs (04/08/2023)   PRAPARE - Administrator, Civil Service (Medical): No    Lack of Transportation (Non-Medical): No  Physical Activity: Unknown (12/07/2022)   Exercise Vital Sign    Days of Exercise per Week: 2 days    Minutes of Exercise per Session: Not on file  Stress: No Stress Concern Present (12/07/2022)   [employer] of Occupational Health - Occupational Stress Questionnaire    Feeling of Stress : Not at all  Social Connections: Moderately Isolated (12/07/2022)   Social Connection and Isolation Panel [NHANES]    Frequency of Communication with Friends and Family: More than three times a week    Frequency of Social Gatherings with Friends and Family: More than three times a week    Attends Religious Services: More than 4 times per year    Active Member of [employer] or Organizations: No    Attends Banker Meetings: Never    Marital Status: Divorced  Catering manager Violence: Not At  Risk (04/08/2023)   Humiliation, Afraid, Rape, and Kick questionnaire    Fear of Current or Ex-Partner: No    Emotionally Abused: No    Physically Abused: No    Sexually Abused: No    FH:  Family History  Problem Relation Age of Onset   Lung cancer Mother    Hypertension Father    CAD Father        a. MI age 23   Heart disease Father    COPD Neg Hx    Diabetes Mellitus II Neg Hx     Past Medical History:  Diagnosis Date   Carpal tunnel syndrome of right wrist    Chronic combined systolic (congestive) and diastolic (congestive) heart failure (HCC)    a. 03/2017 Echo: EF 20-25%, diff HK, Gr2 DD; b. 09/2017 Echo: EF 20-25%, diff HK, Gr2 DD; c. 04/2020 Echo: EF 45-50%, mild LVH, antsept/basal ant HK, nl RV fxn; d. 11/2022 Echo: EF 25%, basal, mid & apical and antsept AK/DK.; e. 04/2023 Echo: EF 20-25%, mild conc LVH, GrI DD, nl RV fxn, small pericardial effusion.   Diabetes (HCC)    GERD (gastroesophageal reflux disease)    HLD (hyperlipidemia)    Hypertension  Lower leg DVT (deep venous thrombosis) (HCC)    NICM (nonischemic cardiomyopathy) (HCC)    a. 03/2017 Echo: EF 20-25%; b. 03/2017 Cath: mild nonobs dzs, EF 25%; c. 09/2017 Echo: EF 20-25%; d. 04/2020 Echo: EF 45-50%; e. 11/2022 Echo: EF 25%; f. 11/2022 MV: small, mild, fixed antsept/infsept defects, likely 2/2 LBBB->low risk; g. 04/2023 Echo: EF 20-25%.   Pleural effusion, left    a. 03/2017 s/p thoracentesis-->300 ml withdrawn--transudative.   Pneumonia 02/19/2017   Pulmonary embolism (HCC)    Stroke [facility])     Current Outpatient Medications  Medication Sig Dispense Refill   albuterol  (VENTOLIN  HFA) 108 (90 Base) MCG/ACT inhaler Inhale 1-2 puffs into the lungs every 6 (six) hours as needed for wheezing or shortness of breath.     Blood Glucose Monitoring Suppl DEVI 1 each by Does not apply route in the morning, at noon, and at bedtime. May substitute to any manufacturer covered by patient's insurance. 1 each 3    dapagliflozin  propanediol (FARXIGA ) 10 MG TABS tablet Take 1 tablet (10 mg total) by mouth daily before breakfast. 30 tablet 11   furosemide  (LASIX ) 40 MG tablet Take 1 tablet (40 mg total) by mouth daily. 90 tablet 3   glipiZIDE  (GLUCOTROL  XL) 10 MG 24 hr tablet Take 1 tablet (10 mg total) by mouth daily with breakfast. 90 tablet 0   hydrALAZINE  (APRESOLINE ) 25 MG tablet Take 1 tablet (25 mg total) by mouth 3 (three) times daily. 90 tablet 6   Lancets (ONETOUCH DELICA PLUS LANCET30G) MISC      loperamide  HCl (IMODIUM ) 1 MG/7.5ML solution Take 2 mg by mouth 4 (four) times daily as needed for diarrhea or loose stools.     metoprolol  succinate (TOPROL -XL) 25 MG 24 hr tablet Take 1 tablet (25 mg total) by mouth daily. 90 tablet 1   rivaroxaban  (XARELTO ) 20 MG TABS tablet Take 1 tablet (20 mg total) by mouth daily with supper. 90 tablet 0   rosuvastatin  (CRESTOR ) 20 MG tablet Take 1 tablet (20 mg total) by mouth daily. 90 tablet 0   sacubitril -valsartan  (ENTRESTO ) 97-103 MG Take 1 tablet by mouth 2 (two) times daily. 180 tablet 1   No current facility-administered medications for this visit.   There were no vitals filed for this visit.  Wt Readings from Last 3 Encounters:  08/06/23 115 lb (52.2 kg)  05/31/23 115 lb (52.2 kg)  05/30/23 115 lb (52.2 kg)   Lab Results  Component Value Date   CREATININE 0.97 08/06/2023   CREATININE 1.13 (H) 06/17/2023   CREATININE 1.00 04/15/2023    PHYSICAL EXAM:  General: Well appearing. No resp difficulty HEENT: normal Neck: supple, no JVD Cor: Regular rhythm, rate. No rubs, gallops or murmurs Lungs: clear Abdomen: soft, nontender, nondistended. Extremities: no cyanosis, clubbing, rash, trace pitting edema around bilateral ankles Neuro: alert & oriented X 3. Moves all 4 extremities w/o difficulty. Affect pleasant   ECG: not done   ASSESSMENT & PLAN:  1: NICM with reduced ejection fraction- - suspect due to HTN - NYHA class II - euvolemic -  weighing daily; reminded to call for overnight weight gain of > 2 pounds or weekly weight gain of > 5 pounds - weight unchanged from last visit here 2 months ago - Echo 04/04/17: EF 20-25% with moderate LVH, Grade II DD, mild/ moderate MR - Echo 09/25/17: EF 20-25% with Grade II DD, mild MR - Echo 05/21/19: EF 45-50% with Grade I DD - Echo 11/29/22: EF 25% with  Grade I DD, no thrombus - Stress test 12/03/22: EF >60%, small, mild in severity, fixed perfusion defect involving the mid anteroseptal and mid inferoseptal segments, small to moderate (mostly posteriorly 1.4 cm) pericardial effusion - continue farxiga  10mg  daily - continue furosemide  40mg  daily - continue hydralazine  25mg  TID - continue metoprolol  succinate 25mg  daily - continue entresto  97/103mg  BID - consider spiro if patient is consistent with f/u visits - cMRI to check for amyloid as daughter also has HF; patient had + serum light chains 07/24 - consider EP  - BMET today - BNP 11/30/22 was 84  2: HTN- - BP 130/95 - continue hydralazine  25mg  TID - saw PCP Thalia Filler) 01/25 - BMP 06/17/23 reviewed and showed sodium 137, potassium 3.3, creatinine 1.13 & GFR 56 - BMET/ CBC today  3: Hyperlipidemia- - saw cardiology Jerelene Monday) 11/24 - LDL 04/07/23 was 131 - continue rosuvastatin  20mg  daily  4: PE/DVT- - continue xarelto   20mg  daily   5: DM- - continue glipizide  XL 10mg  dail - A1c 05/14/23 was 10.4%   Return in 1 month, sooner if needed.   Erin Hayes, [place] 08/06/23

## 2023-09-09 NOTE — Telephone Encounter (Signed)
   Outreach Note  09/09/2023 Name: DESHANTE AHUMADA MRN: [id number] DOB: 02-27-1966  Referred by: Carollynn Cirri, NP  Was unable to reach patient via telephone today and have left HIPAA compliant message asking patient to return my call.    Follow Up Plan: Will collaborate with Care Guide to outreach to schedule follow up with me  Arthur Lash, PharmD, [facility] Clinical Pharmacist [facility] [phone]

## 2023-09-10 ENCOUNTER — Encounter: Payer: Self-pay | Admitting: Family

## 2023-09-10 ENCOUNTER — Ambulatory Visit: Attending: Family | Admitting: Family

## 2023-09-10 VITALS — BP 131/88 | HR 86 | Wt 111.4 lb

## 2023-09-10 DIAGNOSIS — Z86711 Personal history of pulmonary embolism: Secondary | ICD-10-CM | POA: Diagnosis not present

## 2023-09-10 DIAGNOSIS — Z79899 Other long term (current) drug therapy: Secondary | ICD-10-CM | POA: Insufficient documentation

## 2023-09-10 DIAGNOSIS — Z7901 Long term (current) use of anticoagulants: Secondary | ICD-10-CM | POA: Diagnosis not present

## 2023-09-10 DIAGNOSIS — E782 Mixed hyperlipidemia: Secondary | ICD-10-CM

## 2023-09-10 DIAGNOSIS — I5022 Chronic systolic (congestive) heart failure: Secondary | ICD-10-CM | POA: Diagnosis not present

## 2023-09-10 DIAGNOSIS — M109 Gout, unspecified: Secondary | ICD-10-CM | POA: Insufficient documentation

## 2023-09-10 DIAGNOSIS — I5042 Chronic combined systolic (congestive) and diastolic (congestive) heart failure: Secondary | ICD-10-CM | POA: Diagnosis present

## 2023-09-10 DIAGNOSIS — Z8673 Personal history of transient ischemic attack (TIA), and cerebral infarction without residual deficits: Secondary | ICD-10-CM | POA: Insufficient documentation

## 2023-09-10 DIAGNOSIS — I10 Essential (primary) hypertension: Secondary | ICD-10-CM

## 2023-09-10 DIAGNOSIS — I3139 Other pericardial effusion (noninflammatory): Secondary | ICD-10-CM | POA: Diagnosis not present

## 2023-09-10 DIAGNOSIS — I130 Hypertensive heart and chronic kidney disease with heart failure and stage 1 through stage 4 chronic kidney disease, or unspecified chronic kidney disease: Secondary | ICD-10-CM | POA: Insufficient documentation

## 2023-09-10 DIAGNOSIS — I825Y9 Chronic embolism and thrombosis of unspecified deep veins of unspecified proximal lower extremity: Secondary | ICD-10-CM | POA: Diagnosis not present

## 2023-09-10 DIAGNOSIS — E1022 Type 1 diabetes mellitus with diabetic chronic kidney disease: Secondary | ICD-10-CM | POA: Diagnosis not present

## 2023-09-10 DIAGNOSIS — Z7984 Long term (current) use of oral hypoglycemic drugs: Secondary | ICD-10-CM | POA: Diagnosis not present

## 2023-09-10 DIAGNOSIS — E119 Type 2 diabetes mellitus without complications: Secondary | ICD-10-CM

## 2023-09-10 DIAGNOSIS — N189 Chronic kidney disease, unspecified: Secondary | ICD-10-CM | POA: Diagnosis not present

## 2023-09-10 DIAGNOSIS — I428 Other cardiomyopathies: Secondary | ICD-10-CM | POA: Insufficient documentation

## 2023-09-10 DIAGNOSIS — Z794 Long term (current) use of insulin: Secondary | ICD-10-CM | POA: Insufficient documentation

## 2023-09-10 DIAGNOSIS — E785 Hyperlipidemia, unspecified: Secondary | ICD-10-CM | POA: Diagnosis not present

## 2023-09-10 MED ORDER — FUROSEMIDE 40 MG PO TABS: 40.0000 mg | ORAL_TABLET | Freq: Every day | ORAL | 3 refills | Status: AC

## 2023-09-10 MED ORDER — SACUBITRIL-VALSARTAN 97-103 MG PO TABS: 1.0000 | ORAL_TABLET | Freq: Two times a day (BID) | ORAL | 3 refills | Status: AC

## 2023-09-10 MED ORDER — HYDRALAZINE HCL 25 MG PO TABS: 25.0000 mg | ORAL_TABLET | Freq: Three times a day (TID) | ORAL | 11 refills | Status: AC

## 2023-09-10 MED ORDER — ROSUVASTATIN CALCIUM 20 MG PO TABS: 20.0000 mg | ORAL_TABLET | Freq: Every day | ORAL | 3 refills | Status: DC

## 2023-09-10 MED ORDER — DAPAGLIFLOZIN PROPANEDIOL 10 MG PO TABS
10.0000 mg | ORAL_TABLET | Freq: Every day | ORAL | 11 refills | Status: AC
Start: 2023-09-10 — End: ?

## 2023-09-10 MED ORDER — METOPROLOL SUCCINATE ER 25 MG PO TB24: 25.0000 mg | ORAL_TABLET | Freq: Every day | ORAL | 3 refills | Status: AC

## 2023-09-10 MED ORDER — RIVAROXABAN 20 MG PO TABS
20.0000 mg | ORAL_TABLET | Freq: Every day | ORAL | 3 refills | Status: AC
Start: 2023-09-10 — End: ?

## 2023-09-10 NOTE — Patient Instructions (Addendum)
 Follow-Up in: 1 month with Shawnee Dellen, FNP.  At the [facility], you and your health needs are our priority. We have a designated team specialized in the treatment of Heart Failure. This Care Team includes your primary Heart Failure Specialized Cardiologist (physician), Advanced Practice Providers (APPs- Physician Assistants and Nurse Practitioners), and Pharmacist who all work together to provide you with the care you need, when you need it.   You may see any of the following providers on your designated Care Team at your next follow up:  Dr. Jules Oar Dr. Peder Bourdon Dr. Alwin Baars Dr. Judyth Nunnery Shawnee Dellen, FNP Bevely Brush, RPH-CPP  Please be sure to bring in all your medications bottles to every appointment.   Need to Contact Us :  If you have any questions or concerns before your next appointment please send us  a message through [facility] or call our office at [phone].    TO LEAVE A MESSAGE FOR THE NURSE SELECT OPTION 2, PLEASE LEAVE A MESSAGE INCLUDING: YOUR NAME DATE OF BIRTH CALL BACK NUMBER REASON FOR CALL**this is important as we prioritize the call backs  YOU WILL RECEIVE A CALL BACK THE SAME DAY AS LONG AS YOU CALL BEFORE 4:00 PM

## 2023-09-10 NOTE — Progress Notes (Signed)
 [facility] - HEART FAILURE CLINIC - PHARMACIST COUNSELING NOTE  Guideline-Directed Medical Therapy/Evidence Based Medicine  ACE/ARB/ARNI: Sacubitril -valsartan  97-103 mg twice daily Beta Blocker:  NA  - no fill records for metoprolol  succinate 25/d since 01/2023 x 90 days  Aldosterone Antagonist:  NA  - not started due to non-compliance with other medications Diuretic:  Furosemide  40 mg daily   - no fill record for furosemide  40 mg daily since 03/2023 x 90 days  SGLT2i: Dapagliflozin  10 mg daily  Adherence Assessment  Do you ever forget to take your medication? [x] Yes [] No  Do you ever skip doses due to side effects? [] Yes [x] No  Do you have trouble affording your medicines? [] Yes [x] No  Are you ever unable to pick up your medication due to transportation difficulties? [] Yes [x] No  Do you ever stop taking your medications because you don't believe they are helping? [] Yes [x] No  Do you check your weight daily? [x] Yes [] No   Adherence strategy:  -- Does forget to take her medications -- Use a pill box to organize, they re-fill at the beginning of each week   Barriers to obtaining medications: None identified   Vital signs: HR 88, BP 131/88, weight (pounds) 111.6 (usually at home the weight is 112 - 113 lbs)  BP last 3 encounters 08/06/23 (!) 130/95  06/17/23 130/69  05/31/23 130/88   Renal Function: GFR 51 mL/min 08/06/23 Echo 04/04/17: EF 20-25% with moderate LVH, Grade II DD, mild/ moderate MR Echo 09/25/17: EF 20-25% with Grade II DD, mild MR Echo 05/21/19: EF 45-50% with Grade I DD     Latest Ref Rng & Units 08/06/2023    1:10 PM 06/17/2023    3:49 PM 04/15/2023    4:00 PM  BMP  Glucose 70 - 99 mg/dL 440  347  425   BUN 6 - 20 mg/dL 16  15  16    Creatinine 0.44 - 1.00 mg/dL 9.56  3.87  5.64   Sodium 135 - 145 mmol/L 133  137  137   Potassium 3.5 - 5.1 mmol/L 3.9  3.3  4.2   Chloride 98 - 111 mmol/L 101  106  106   CO2 22 - 32 mmol/L 23  21  21     Calcium  8.9 - 10.3 mg/dL 8.9  8.2  8.3    Past Medical History:  Diagnosis Date   Carpal tunnel syndrome of right wrist    Chronic combined systolic (congestive) and diastolic (congestive) heart failure (HCC)    a. 03/2017 Echo: EF 20-25%, diff HK, Gr2 DD; b. 09/2017 Echo: EF 20-25%, diff HK, Gr2 DD; c. 04/2020 Echo: EF 45-50%, mild LVH, antsept/basal ant HK, nl RV fxn; d. 11/2022 Echo: EF 25%, basal, mid & apical and antsept AK/DK.; e. 04/2023 Echo: EF 20-25%, mild conc LVH, GrI DD, nl RV fxn, small pericardial effusion.   Diabetes (HCC)    GERD (gastroesophageal reflux disease)    HLD (hyperlipidemia)    Hypertension    Lower leg DVT (deep venous thrombosis) (HCC)    NICM (nonischemic cardiomyopathy) (HCC)    a. 03/2017 Echo: EF 20-25%; b. 03/2017 Cath: mild nonobs dzs, EF 25%; c. 09/2017 Echo: EF 20-25%; d. 04/2020 Echo: EF 45-50%; e. 11/2022 Echo: EF 25%; f. 11/2022 MV: small, mild, fixed antsept/infsept defects, likely 2/2 LBBB->low risk; g. 04/2023 Echo: EF 20-25%.   Pleural effusion, left    a. 03/2017 s/p thoracentesis-->300 ml withdrawn--transudative.   Pneumonia 02/19/2017   Pulmonary embolism (HCC)  Stroke [facility])    ASSESSMENT Erin Hayes is a 58 year old female who presents to the HF clinic for their one month follow-up appointment. They have a past medication history significant for stroke, HTN, hypokalemia, T1DM, CKD, hyperlipidemia, gout, DVT/ PE (2023) - will need life long anticoagulation and chronic heart failure. They were diagnosed with HFrEF 11/18 with an EF of 20-25% of unknown etiology, global LV dysfunction. Did not rule out ischemia due to diabetes, but at the time well controlled with A1c of 6.8. At last HF appointment 4/1, patient did not take medications before and their BP was above goal at 130/95. Their weights have been stable over the last 4 months. No medication changes were made at this appointment, Farxiga  was added 3/25.   Medications: Patient is not yet on optimal  GDMT, they remain without spironolactone  and possibly are not compliant with their beta-blocker. Their blood pressure is uncontrolled above goal of 130/80. Today when discussing medication, patient requested refills for furosemide , glipizide , metoprolol , hydralazine , Xarelto , and Crestor . Based on fill records, patient likely have refills of hydralazine , glipizide , Xarelto , and Crestor  at the pharmacy. Unable to reach pharmacy as they phone systems are down and we were unable to connect to confirm records.   Symptoms: Patient reported no SOB, no s/sx of UTI (no burning, urgency to use the bathroom), no dizziness or headaches. They have no edema (no tightness in legs, abdomen or arms; also no change in appetite), and they deny chest pain.   Recent ED Visit (past 6 months):  -- Date - 06/17/2023, CC - gout  PLAN  Prevention A1c 10.4 (05/14/23)  -- Dapagliflozin  10 mg daily -- Glipizide  10 mg  LDL 131 (04/07/23) -- Crestor  20 daily  BP -- Hydralazine  25 TID - 07/17/2023 x 30 days   Recommendations -- Caution with Farxiga  given patient's A1c is > 10 -- Recommend re-starting metoprolol  and lasix  since patient stopped taking due to being out of refills -- Consider adding spironolactone  once patient is compliant with other GDMT therapy  -- Continue regimen as directed by NP  Thank you for allowing pharmacy to participate in this patient's care.   Time spent: 15 minutes  Craven Do, PharmD Pharmacy Resident  09/10/2023 7:23 AM

## 2023-09-27 ENCOUNTER — Other Ambulatory Visit

## 2023-10-14 ENCOUNTER — Telehealth: Payer: Self-pay | Admitting: Family

## 2023-10-14 ENCOUNTER — Encounter (HOSPITAL_COMMUNITY): Payer: Self-pay

## 2023-10-14 NOTE — Telephone Encounter (Signed)
 Called to confirm/remind patient of their appointment at the [facility] on 10/15/23.   Appointment:   [x] Confirmed  [] Left mess   [] No answer/No voice mail  [] VM Full/unable to leave message  [] Phone not in service  Patient reminded to bring all medications and/or complete list.  Confirmed patient has transportation. Gave directions, instructed to utilize valet parking.

## 2023-10-15 ENCOUNTER — Ambulatory Visit: Attending: Family | Admitting: Family

## 2023-10-15 ENCOUNTER — Encounter: Payer: Self-pay | Admitting: Family

## 2023-10-15 VITALS — BP 149/104 | HR 77 | Wt 110.0 lb

## 2023-10-15 DIAGNOSIS — I5042 Chronic combined systolic (congestive) and diastolic (congestive) heart failure: Secondary | ICD-10-CM | POA: Insufficient documentation

## 2023-10-15 DIAGNOSIS — Z79899 Other long term (current) drug therapy: Secondary | ICD-10-CM | POA: Insufficient documentation

## 2023-10-15 DIAGNOSIS — I130 Hypertensive heart and chronic kidney disease with heart failure and stage 1 through stage 4 chronic kidney disease, or unspecified chronic kidney disease: Secondary | ICD-10-CM | POA: Insufficient documentation

## 2023-10-15 DIAGNOSIS — Z86718 Personal history of other venous thrombosis and embolism: Secondary | ICD-10-CM | POA: Insufficient documentation

## 2023-10-15 DIAGNOSIS — I3139 Other pericardial effusion (noninflammatory): Secondary | ICD-10-CM | POA: Diagnosis not present

## 2023-10-15 DIAGNOSIS — E1022 Type 1 diabetes mellitus with diabetic chronic kidney disease: Secondary | ICD-10-CM | POA: Insufficient documentation

## 2023-10-15 DIAGNOSIS — E785 Hyperlipidemia, unspecified: Secondary | ICD-10-CM | POA: Insufficient documentation

## 2023-10-15 DIAGNOSIS — I5022 Chronic systolic (congestive) heart failure: Secondary | ICD-10-CM

## 2023-10-15 DIAGNOSIS — E782 Mixed hyperlipidemia: Secondary | ICD-10-CM

## 2023-10-15 DIAGNOSIS — N189 Chronic kidney disease, unspecified: Secondary | ICD-10-CM | POA: Diagnosis not present

## 2023-10-15 DIAGNOSIS — Z7984 Long term (current) use of oral hypoglycemic drugs: Secondary | ICD-10-CM | POA: Insufficient documentation

## 2023-10-15 DIAGNOSIS — E119 Type 2 diabetes mellitus without complications: Secondary | ICD-10-CM

## 2023-10-15 DIAGNOSIS — Z794 Long term (current) use of insulin: Secondary | ICD-10-CM | POA: Insufficient documentation

## 2023-10-15 DIAGNOSIS — Z7901 Long term (current) use of anticoagulants: Secondary | ICD-10-CM | POA: Insufficient documentation

## 2023-10-15 DIAGNOSIS — I825Y9 Chronic embolism and thrombosis of unspecified deep veins of unspecified proximal lower extremity: Secondary | ICD-10-CM | POA: Diagnosis not present

## 2023-10-15 DIAGNOSIS — Z86711 Personal history of pulmonary embolism: Secondary | ICD-10-CM | POA: Diagnosis not present

## 2023-10-15 DIAGNOSIS — R5383 Other fatigue: Secondary | ICD-10-CM | POA: Diagnosis not present

## 2023-10-15 DIAGNOSIS — I10 Essential (primary) hypertension: Secondary | ICD-10-CM

## 2023-10-15 DIAGNOSIS — I428 Other cardiomyopathies: Secondary | ICD-10-CM | POA: Insufficient documentation

## 2023-10-15 DIAGNOSIS — Z8673 Personal history of transient ischemic attack (TIA), and cerebral infarction without residual deficits: Secondary | ICD-10-CM | POA: Insufficient documentation

## 2023-10-15 NOTE — Patient Instructions (Addendum)
 Your cardiac MRI is scheduled for today. Please make sure you do not miss this.

## 2023-10-15 NOTE — Progress Notes (Signed)
 Advanced Heart Failure Clinic Note    PCP: Carollynn Cirri, NP (last seen 01/25) Cardiologist: Timothy Gollan, MD (last seen 11/24)  Chief Complaint: fatigue  HPI:  Erin Hayes is a 58 y/o female with a history of stroke, HTN, hypokalemia, T1DM, CKD, hyperlipidemia, gout, DVT/ PE (2023) and chronic heart failure.   Echo 04/04/17: EF 20-25% with moderate LVH, Grade II DD, mild/ moderate MR Echo 09/25/17: EF 20-25% with Grade II DD, mild MR Echo 05/21/19: EF 45-50% with Grade I DD  Admitted 11/28/22 due to extensive DVT and worsened HFrEF possibly due to old anterior infarct. Reports stress fracture diagnosed 8 weeks ago and relative immobilization. CT AP (11/28/22) with partially occlusive thrombus extending from the bilateral common femoral veins, iliac vein, and infrahepatic IVC. CTA PE (7/25) with no pulmonary embolism. Started on heparin  gtt. Transitioned to Eliquis  on 7/28. Hematology consulted and recommended monoclonal gammopathy workup and outpatient hypercoagulability workup. She will need lifelong anticoagulation. 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 Creatinine 2.43 up from 0.9 likely due to contrast nephropathy as she received contrasted studies 2 days prior. UA notable for high specific gravity and ketonuria. PVL renal artery duplex and veins showed no evidence of hemodynamically significant stenosis in the renal arteries. The resistive indices are also within normal limits so the IVC DVT extending into the renal vein is not contributing to the AKI per discussion with vascular surgery.   Stress test 12/03/22: EF >60%, small, mild in severity, fixed perfusion defect involving the mid anteroseptal and mid inferoseptal  segments, small to moderate (mostly posteriorly 1.4 cm) pericardial effusion  Admitted 04/06/23 due to acute onset of AMS that lasted about an hour. MRI of the brain showed small acute cortical infarct within the left insular, CT angiogram of the neck and head showed acute distal left M4 occlusion. CT angiogram of chest showed severe right sided pulmonary embolism with extension to multiple upper lobes branches.  Duplex ultrasound showed extensive left lower extremity DVT. Patient has been seen by teleneurology, started on heparin  drip. Patient is seen by vascular surgery, no need for intervention. She was also seen by hematology, she did not fail Eliquis  treatment, she was not compliant. At this point, treatment will be changed to Xarelto . Echocardiogram has been performed, ejection fraction 20 to 25% without a PFO.   Was in the ED 06/17/23 due to left great toe pain thought to possibly be gout. Glucose found to be 399 so IVF given. Gout was ruled out.  Seen in [facility] 05/25 and metoprolol  succinate 25mg  daily.   She presents today for a HF follow-up visit with a chief complaint of fatigue. Sleeping well on 2 pillows. Denies shortness of breath, chest pain, palpitations, abdominal distention, pedal edema or dizziness. She did not take her medications yet this morning. Has upcoming cMRI 10/16/23 to evaluate possible amyloid.   Previous cardiac studies:  LHC 04/05/17: There is severe left ventricular systolic dysfunction. LV end diastolic pressure is severely elevated. The left ventricular ejection fraction is less than 25% by visual estimate.  1.  Mild nonobstructive coronary artery disease. 2.  Severely reduced LV systolic function with an ejection fraction of 15-20% with global hypokinesis. 3.  Severely elevated left ventricular end-diastolic pressure at 33 mmHg.  Chest CTA 11/29/22: No PE, small/ moderate  pleural effusion  ROS: All systems negative except as listed in HPI, PMH and Problem  List.  SH:  Social History   Socioeconomic History   Marital status: Single    Spouse name: Not on file   Number of children: 3   Years of education: 12   Highest education level: 12th grade  Occupational History   Occupation: home maker  Tobacco Use   Smoking status: Never   Smokeless tobacco: Never  Vaping Use   Vaping status: Never Used  Substance and Sexual Activity   Alcohol use: No   Drug use: No   Sexual activity: Never  Other Topics Concern   Not on file  Social History Narrative   Not on file   Social Drivers of Health   Financial Resource Strain: Low Risk  (04/08/2023)   Overall Financial Resource Strain (CARDIA)    Difficulty of Paying Living Expenses: Not hard at all  Food Insecurity: No Food Insecurity (04/08/2023)   Hunger Vital Sign    Worried About Running Out of Food in the Last Year: Never true    Ran Out of Food in the Last Year: Never true  Transportation Needs: No Transportation Needs (04/08/2023)   PRAPARE - Administrator, Civil Service (Medical): No    Lack of Transportation (Non-Medical): No  Physical Activity: Unknown (12/07/2022)   Exercise Vital Sign    Days of Exercise per Week: 2 days    Minutes of Exercise per Session: Not on file  Stress: No Stress Concern Present (12/07/2022)   [employer] of Occupational Health - Occupational Stress Questionnaire    Feeling of Stress : Not at all  Social Connections: Moderately Isolated (12/07/2022)   Social Connection and Isolation Panel [NHANES]    Frequency of Communication with Friends and Family: More than three times a week    Frequency of Social Gatherings with Friends and Family: More than three times a week    Attends Religious Services: More than 4 times per year    Active Member of [employer] or Organizations: No    Attends Banker Meetings: Never    Marital Status: Divorced  Catering manager Violence: Not At Risk (04/08/2023)   Humiliation, Afraid, Rape, and Kick  questionnaire    Fear of Current or Ex-Partner: No    Emotionally Abused: No    Physically Abused: No    Sexually Abused: No    FH:  Family History  Problem Relation Age of Onset   Lung cancer Mother    Hypertension Father    CAD Father        a. MI age 31   Heart disease Father    COPD Neg Hx    Diabetes Mellitus II Neg Hx     Past Medical History:  Diagnosis Date   Carpal tunnel syndrome of right wrist    Chronic combined systolic (congestive) and diastolic (congestive) heart failure (HCC)    a. 03/2017 Echo: EF 20-25%, diff HK, Gr2 DD; b. 09/2017 Echo: EF 20-25%, diff HK, Gr2 DD; c. 04/2020 Echo: EF 45-50%, mild LVH, antsept/basal ant HK, nl RV fxn; d. 11/2022 Echo: EF 25%, basal, mid & apical and antsept AK/DK.; e. 04/2023 Echo: EF 20-25%, mild conc LVH, GrI DD, nl RV fxn, small pericardial effusion.   Diabetes (HCC)    GERD (gastroesophageal reflux disease)    HLD (hyperlipidemia)    Hypertension    Lower leg DVT (deep venous thrombosis) (HCC)    NICM (  nonischemic cardiomyopathy) (HCC)    a. 03/2017 Echo: EF 20-25%; b. 03/2017 Cath: mild nonobs dzs, EF 25%; c. 09/2017 Echo: EF 20-25%; d. 04/2020 Echo: EF 45-50%; e. 11/2022 Echo: EF 25%; f. 11/2022 MV: small, mild, fixed antsept/infsept defects, likely 2/2 LBBB->low risk; g. 04/2023 Echo: EF 20-25%.   Pleural effusion, left    a. 03/2017 s/p thoracentesis-->300 ml withdrawn--transudative.   Pneumonia 02/19/2017   Pulmonary embolism (HCC)    Stroke [facility])     Current Outpatient Medications  Medication Sig Dispense Refill   albuterol  (VENTOLIN  HFA) 108 (90 Base) MCG/ACT inhaler Inhale 1-2 puffs into the lungs every 6 (six) hours as needed for wheezing or shortness of breath.     Blood Glucose Monitoring Suppl DEVI 1 each by Does not apply route in the morning, at noon, and at bedtime. May substitute to any manufacturer covered by patient's insurance. 1 each 3   dapagliflozin  propanediol (FARXIGA ) 10 MG TABS tablet Take 1 tablet  (10 mg total) by mouth daily before breakfast. 30 tablet 11   furosemide  (LASIX ) 40 MG tablet Take 1 tablet (40 mg total) by mouth daily. 90 tablet 3   glipiZIDE  (GLUCOTROL  XL) 10 MG 24 hr tablet Take 1 tablet (10 mg total) by mouth daily with breakfast. 90 tablet 0   hydrALAZINE  (APRESOLINE ) 25 MG tablet Take 1 tablet (25 mg total) by mouth 3 (three) times daily. 90 tablet 11   Lancets (ONETOUCH DELICA PLUS LANCET30G) MISC      loperamide  HCl (IMODIUM ) 1 MG/7.5ML solution Take 2 mg by mouth 4 (four) times daily as needed for diarrhea or loose stools.     metoprolol  succinate (TOPROL -XL) 25 MG 24 hr tablet Take 1 tablet (25 mg total) by mouth daily. 90 tablet 3   rivaroxaban  (XARELTO ) 20 MG TABS tablet Take 1 tablet (20 mg total) by mouth daily with supper. 90 tablet 3   rosuvastatin  (CRESTOR ) 20 MG tablet Take 1 tablet (20 mg total) by mouth daily. 90 tablet 3   sacubitril -valsartan  (ENTRESTO ) 97-103 MG Take 1 tablet by mouth 2 (two) times daily. 180 tablet 3   No current facility-administered medications for this visit.   Vitals:   10/15/23 0953  BP: (!) 149/104  Pulse: 77  SpO2: 98%  Weight: 110 lb (49.9 kg)   Wt Readings from Last 3 Encounters:  10/15/23 110 lb (49.9 kg)  09/10/23 111 lb 6 oz (50.5 kg)  08/06/23 115 lb (52.2 kg)   Lab Results  Component Value Date   CREATININE 0.97 08/06/2023   CREATININE 1.13 (H) 06/17/2023   CREATININE 1.00 04/15/2023    PHYSICAL EXAM:  General: Well appearing in wheelchair. No resp difficulty HEENT: normal Neck: supple, no JVD Cor: Regular rhythm, rate. No rubs, gallops or murmurs Lungs: clear Abdomen: soft, nontender, nondistended. Extremities: no cyanosis, clubbing, rash, edema Neuro: alert & oriented X 3. Moves all 4 extremities w/o difficulty. Affect pleasant  ECG: not done   ASSESSMENT & PLAN:  1: NICM with reduced ejection fraction- - suspect due to HTN - NYHA class II - euvolemic - weighing daily; reminded to call for  overnight weight gain of > 2 pounds or weekly weight gain of > 5 pounds - weight stable from last visit here 1 month ago - Echo 04/04/17: EF 20-25% with moderate LVH, Grade II DD, mild/ moderate MR - Echo 09/25/17: EF 20-25% with Grade II DD, mild MR - Echo 05/21/19: EF 45-50% with Grade I DD - Echo 11/29/22: EF 25%  with Grade I DD, no thrombus - Stress test 12/03/22: EF >60%, small, mild in severity, fixed perfusion defect involving the mid anteroseptal and mid inferoseptal segments, small to moderate (mostly posteriorly 1.4 cm) pericardial effusion - cMRI is on 10/16/23 to check for amyloid as daughter also has HF; patient had + serum light chains 07/24 - continue farxiga  10mg  daily - continue furosemide  40mg  daily  - continue hydralazine  25mg  TID - continue metoprolol  succinate 25mg  daily - continue entresto  97/103mg  BID - consider spiro if patient is consistent with f/u visits - BNP 11/30/22 was 84  2: HTN- - BP 149/104 but she hasn't taken any of her medications yet today; encouraged to take them prior to coming to visits - continue hydralazine  25mg  TID - saw PCP Erin Hayes) 01/25 - BMP 08/06/23 reviewed: sodium 133, potassium 3.9, creatinine 0.97 & GFR >60  3: Hyperlipidemia- - saw cardiology Erin Hayes) 11/24 - LDL 04/07/23 was 131 - continue rosuvastatin  20mg  daily   4: PE/DVT- - continue xarelto   20mg  daily   5: DM- - continue glipizide  XL 10mg  dail - A1c 05/14/23 was 10.4%   Return in 2 months, sooner if needed.   Erin Hayes, [place] 09/10/23

## 2023-10-16 ENCOUNTER — Ambulatory Visit: Admission: RE | Admit: 2023-10-16 | Source: Ambulatory Visit

## 2023-10-24 ENCOUNTER — Other Ambulatory Visit: Payer: Self-pay

## 2023-10-24 DIAGNOSIS — E119 Type 2 diabetes mellitus without complications: Secondary | ICD-10-CM

## 2023-10-24 MED ORDER — ROSUVASTATIN CALCIUM 20 MG PO TABS: 20.0000 mg | ORAL_TABLET | Freq: Every day | ORAL | 3 refills | Status: AC

## 2023-10-24 MED ORDER — GLIPIZIDE ER 10 MG PO TB24: 10.0000 mg | ORAL_TABLET | Freq: Every day | ORAL | 0 refills | Status: DC

## 2023-12-12 ENCOUNTER — Ambulatory Visit: Payer: 59

## 2023-12-12 VITALS — Ht 59.0 in | Wt 110.0 lb

## 2023-12-12 DIAGNOSIS — Z0000 Encounter for general adult medical examination without abnormal findings: Secondary | ICD-10-CM

## 2023-12-12 DIAGNOSIS — Z1231 Encounter for screening mammogram for malignant neoplasm of breast: Secondary | ICD-10-CM

## 2023-12-12 DIAGNOSIS — Z1211 Encounter for screening for malignant neoplasm of colon: Secondary | ICD-10-CM

## 2023-12-12 NOTE — Patient Instructions (Addendum)
 Erin Hayes , Thank you for taking time out of your busy schedule to complete your Annual Wellness Visit with me. I enjoyed our conversation and look forward to speaking with you again next year. I, as well as your care team,  appreciate your ongoing commitment to your health goals. Please review the following plan we discussed and let me know if I can assist you in the future. Your Game plan/ To Do List    Referrals: If you haven't heard from the office you've been referred to, please reach out to them at the phone provided.   Follow up Visits: We will see or speak with you next year for your Next Medicare AWV with our clinical staff 12/25/24 @ 9:30a Have you seen your provider in the last 6 months (3 months if uncontrolled diabetes)?   Clinician Recommendations:  Aim for 30 minutes of exercise or brisk walking, 6-8 glasses of water, and 5 servings of fruits and vegetables each day.       This is a list of the screenings recommended for you:  Health Maintenance  Topic Date Due   DTaP/Tdap/Td vaccine (1 - Tdap) Never done   Pap with HPV screening  02/29/2008   Mammogram  Never done   Zoster (Shingles) Vaccine (1 of 2) Never done   Pneumococcal Vaccine for high risk medical condition (2 of 2 - PCV) 09/09/2021   Pneumococcal Vaccine for age over 39 (2 of 2 - PCV) 09/09/2021   Colon Cancer Screening  01/21/2022   Eye exam for diabetics  05/11/2022   COVID-19 Vaccine (3 - 2024-25 season) 01/06/2023   Hemoglobin A1C  11/11/2023   Flu Shot  12/06/2023   Complete foot exam   12/17/2023   Yearly kidney health urinalysis for diabetes  01/31/2024   Yearly kidney function blood test for diabetes  08/05/2024   Medicare Annual Wellness Visit  12/11/2024   Hepatitis C Screening  Completed   HIV Screening  Completed   Hepatitis B Vaccine  Aged Out   HPV Vaccine  Aged Out   Meningitis B Vaccine  Aged Out    Advanced directives: (Declined) Advance directive discussed with you today. Even though  you declined this today, please call our office should you change your mind, and we can give you the proper paperwork for you to fill out. Advance Care Planning is important because it:  [x]  Makes sure you receive the medical care that is consistent with your values, goals, and preferences  [x]  It provides guidance to your family and loved ones and reduces their decisional burden about whether or not they are making the right decisions based on your wishes.  Follow the link provided in your after visit summary or read over the paperwork we have mailed to you to help you started getting your Advance Directives in place. If you need assistance in completing these, please reach out to us  so that we can help you!  See attachments for Preventive Care and Fall Prevention Tips.

## 2023-12-12 NOTE — Progress Notes (Signed)
 Subjective:   Erin Hayes is a 58 y.o. who presents for a Medicare Wellness preventive visit.  As a reminder, Annual Wellness Visits don't include a physical exam, and some assessments may be limited, especially if this visit is performed virtually. We may recommend an in-person follow-up visit with your provider if needed.  Visit Complete: Virtual I connected with  Reena LITTIE Brewster on 12/12/23 by a audio enabled telemedicine application and verified that I am speaking with the correct person using two identifiers.  Patient Location: Home  Provider Location: Home Office  I discussed the limitations of evaluation and management by telemedicine. The patient expressed understanding and agreed to proceed.  Vital Signs: Because this visit was a virtual/telehealth visit, some criteria may be missing or patient reported. Any vitals not documented were not able to be obtained and vitals that have been documented are patient reported.    Persons Participating in Visit: Patient.  AWV Questionnaire: No: Patient Medicare AWV questionnaire was not completed prior to this visit.  Cardiac Risk Factors include: advanced age (>32men, >71 women);diabetes mellitus;hypertension     Objective:    Today's Vitals   12/12/23 0934  Weight: 110 lb (49.9 kg)  Height: 4' 11 (1.499 m)   Body mass index is 22.22 kg/m.     12/12/2023    9:39 AM 06/17/2023    3:31 PM 04/08/2023    7:38 PM 04/06/2023    2:27 PM 12/07/2022   10:33 AM 06/26/2022    8:27 AM 04/25/2022    4:07 PM  Advanced Directives  Does Patient Have a Medical Advance Directive? No No No No No No No  Would patient like information on creating a medical advance directive? No - Patient declined  No - Patient declined  No - Patient declined No - Patient declined No - Patient declined    Current Medications (verified) Outpatient Encounter Medications as of 12/12/2023  Medication Sig   albuterol  (VENTOLIN  HFA) 108 (90 Base) MCG/ACT inhaler  Inhale 1-2 puffs into the lungs every 6 (six) hours as needed for wheezing or shortness of breath.   Blood Glucose Monitoring Suppl DEVI 1 each by Does not apply route in the morning, at noon, and at bedtime. May substitute to any manufacturer covered by patient's insurance.   dapagliflozin  propanediol (FARXIGA ) 10 MG TABS tablet Take 1 tablet (10 mg total) by mouth daily before breakfast.   furosemide  (LASIX ) 40 MG tablet Take 1 tablet (40 mg total) by mouth daily.   glipiZIDE  (GLUCOTROL  XL) 10 MG 24 hr tablet Take 1 tablet (10 mg total) by mouth daily with breakfast.   hydrALAZINE  (APRESOLINE ) 25 MG tablet Take 1 tablet (25 mg total) by mouth 3 (three) times daily.   Lancets (ONETOUCH DELICA PLUS LANCET30G) MISC    loperamide  HCl (IMODIUM ) 1 MG/7.5ML solution Take 2 mg by mouth 4 (four) times daily as needed for diarrhea or loose stools.   metoprolol  succinate (TOPROL -XL) 25 MG 24 hr tablet Take 1 tablet (25 mg total) by mouth daily.   rivaroxaban  (XARELTO ) 20 MG TABS tablet Take 1 tablet (20 mg total) by mouth daily with supper.   rosuvastatin  (CRESTOR ) 20 MG tablet Take 1 tablet (20 mg total) by mouth daily.   sacubitril -valsartan  (ENTRESTO ) 97-103 MG Take 1 tablet by mouth 2 (two) times daily.   No facility-administered encounter medications on file as of 12/12/2023.    Allergies (verified) Patient has no known allergies.   History: Past Medical History:  Diagnosis Date  Carpal tunnel syndrome of right wrist    Chronic combined systolic (congestive) and diastolic (congestive) heart failure (HCC)    a. 03/2017 Echo: EF 20-25%, diff HK, Gr2 DD; b. 09/2017 Echo: EF 20-25%, diff HK, Gr2 DD; c. 04/2020 Echo: EF 45-50%, mild LVH, antsept/basal ant HK, nl RV fxn; d. 11/2022 Echo: EF 25%, basal, mid & apical and antsept AK/DK.; e. 04/2023 Echo: EF 20-25%, mild conc LVH, GrI DD, nl RV fxn, small pericardial effusion.   Diabetes (HCC)    GERD (gastroesophageal reflux disease)    HLD  (hyperlipidemia)    Hypertension    Lower leg DVT (deep venous thrombosis) (HCC)    NICM (nonischemic cardiomyopathy) (HCC)    a. 03/2017 Echo: EF 20-25%; b. 03/2017 Cath: mild nonobs dzs, EF 25%; c. 09/2017 Echo: EF 20-25%; d. 04/2020 Echo: EF 45-50%; e. 11/2022 Echo: EF 25%; f. 11/2022 MV: small, mild, fixed antsept/infsept defects, likely 2/2 LBBB->low risk; g. 04/2023 Echo: EF 20-25%.   Pleural effusion, left    a. 03/2017 s/p thoracentesis-->300 ml withdrawn--transudative.   Pneumonia 02/19/2017   Pulmonary embolism (HCC)    Stroke [facility])    Past Surgical History:  Procedure Laterality Date   COLONOSCOPY WITH PROPOFOL  N/A 01/21/2017   Procedure: COLONOSCOPY WITH PROPOFOL ;  Surgeon: Therisa Bi, MD;  Location: [facility] ENDOSCOPY;  Service: Gastroenterology;  Laterality: N/A;   ESOPHAGOGASTRODUODENOSCOPY (EGD) WITH PROPOFOL  N/A 01/21/2017   Procedure: ESOPHAGOGASTRODUODENOSCOPY (EGD) WITH PROPOFOL ;  Surgeon: Therisa Bi, MD;  Location: [facility] ENDOSCOPY;  Service: Gastroenterology;  Laterality: N/A;   FLEXIBLE SIGMOIDOSCOPY N/A 11/04/2016   Procedure: FLEXIBLE SIGMOIDOSCOPY;  Surgeon: Dianna Specking, MD;  Location: [facility] ENDOSCOPY;  Service: Endoscopy;  Laterality: N/A;   HIP PINNING,CANNULATED Left 06/19/2021   Procedure: CANNULATED HIP PINNING;  Surgeon: Rollene Cough, MD;  Location: [facility] ORS;  Service: Orthopedics;  Laterality: Left;   LEFT HEART CATH AND CORONARY ANGIOGRAPHY N/A 04/05/2017   Procedure: LEFT HEART CATH AND CORONARY ANGIOGRAPHY;  Surgeon: Darron Deatrice LABOR, MD;  Location: [facility] INVASIVE CV LAB;  Service: Cardiovascular;  Laterality: N/A;   LITHOTRIPSY     Family History  Problem Relation Age of Onset   Lung cancer Mother    Hypertension Father    CAD Father        a. MI age 61   Heart disease Father    COPD Neg Hx    Diabetes Mellitus II Neg Hx    Social History   Socioeconomic History   Marital status: Single    Spouse name: Not on file   Number of children: 3    Years of education: 12   Highest education level: 12th grade  Occupational History   Occupation: Arts development officer  Tobacco Use   Smoking status: Never   Smokeless tobacco: Never  Vaping Use   Vaping status: Never Used  Substance and Sexual Activity   Alcohol use: No   Drug use: No   Sexual activity: Never  Other Topics Concern   Not on file  Social History Narrative   Not on file   Social Drivers of Health   Financial Resource Strain: Low Risk  (12/12/2023)   Overall Financial Resource Strain (CARDIA)    Difficulty of Paying Living Expenses: Not hard at all  Food Insecurity: No Food Insecurity (12/12/2023)   Hunger Vital Sign    Worried About Running Out of Food in the Last Year: Never true    Ran Out of Food in the Last Year: Never true  Transportation Needs:  No Transportation Needs (12/12/2023)   PRAPARE - Administrator, Civil Service (Medical): No    Lack of Transportation (Non-Medical): No  Physical Activity: Insufficiently Active (12/12/2023)   Exercise Vital Sign    Days of Exercise per Week: 1 day    Minutes of Exercise per Session: 20 min  Stress: No Stress Concern Present (12/12/2023)   [employer] of Occupational Health - Occupational Stress Questionnaire    Feeling of Stress: Not at all  Social Connections: Moderately Integrated (12/12/2023)   Social Connection and Isolation Panel    Frequency of Communication with Friends and Family: More than three times a week    Frequency of Social Gatherings with Friends and Family: More than three times a week    Attends Religious Services: More than 4 times per year    Active Member of [employer] or Organizations: Yes    Attends Engineer, structural: More than 4 times per year    Marital Status: Never married    Tobacco Counseling Counseling given: Not Answered    Clinical Intake:  Pre-visit preparation completed: Yes  Pain : No/denies pain     BMI - recorded: 22.22 Nutritional Status: BMI of 19-24   Normal Nutritional Risks: None Diabetes: Yes CBG done?: No Did pt. bring in CBG monitor from home?: No  Lab Results  Component Value Date   HGBA1C 10.4 (A) 05/14/2023   HGBA1C 7.8 (H) 01/31/2023   HGBA1C 7.4 (H) 01/19/2022     How often do you need to have someone help you when you read instructions, pamphlets, or other written materials from your doctor or pharmacy?: 1 - Never  Interpreter Needed?: No  Information entered by :: Rojelio Files LPN   Activities of Daily Living     12/12/2023    9:38 AM 04/08/2023    7:38 PM  In your present state of health, do you have any difficulty performing the following activities:  Hearing? 0 0  Vision? 0 0  Difficulty concentrating or making decisions? 0 0  Walking or climbing stairs? 1   Comment Uses a Wheelchair   Dressing or bathing? 0   Doing errands, shopping? 0 0  Preparing Food and eating ? N   Using the Toilet? N   In the past six months, have you accidently leaked urine? N   Do you have problems with loss of bowel control? N   Managing your Medications? N   Managing your Finances? N   Housekeeping or managing your Housekeeping? N     Patient Care Team: Antonette Angeline ORN, NP as PCP - General (Internal Medicine) Perla Evalene PARAS, MD as PCP - Cardiology (Cardiology) Perla Evalene PARAS, MD as Consulting Physician (Cardiology) Donette Ellouise LABOR, FNP as Nurse Practitioner (Family Medicine) Alana, Sharyle LABOR, RPH-CPP as Pharmacist  I have updated your Care Teams any recent Medical Services you may have received from other providers in the past year.     Assessment:   This is a routine wellness examination for Jamirah.  Hearing/Vision screen Hearing Screening - Comments:: Denies hearing difficulties   Vision Screening - Comments:: Wears rx glasses - up to date with routine eye exams with  [facility]   Goals Addressed               This Visit's Progress     Increase physical activity (pt-stated)        Remain  Active       Depression Screen  12/12/2023    9:38 AM 05/14/2023    9:54 AM 04/15/2023   11:15 AM 01/31/2023   11:27 AM 12/17/2022    1:50 PM 12/07/2022   10:31 AM 04/26/2022    3:30 PM  PHQ 2/9 Scores  PHQ - 2 Score 0 0 0 0 0 0 0  PHQ- 9 Score    0 1 0     Fall Risk     12/12/2023    9:39 AM 05/14/2023    9:54 AM 01/31/2023   11:28 AM 12/17/2022    1:51 PM 12/07/2022   10:33 AM  Fall Risk   Falls in the past year? 0 0 0 0 0  Number falls in past yr: 0    0  Injury with Fall? 0  0 0 0  Risk for fall due to : No Fall Risks  No Fall Risks No Fall Risks No Fall Risks  Follow up Falls evaluation completed    Falls prevention discussed;Falls evaluation completed    MEDICARE RISK AT HOME:  Medicare Risk at Home Any stairs in or around the home?: No If so, are there any without handrails?: No Home free of loose throw rugs in walkways, pet beds, electrical cords, etc?: Yes Adequate lighting in your home to reduce risk of falls?: Yes Life alert?: No Use of a cane, walker or w/c?: Yes Grab bars in the bathroom?: No Shower chair or bench in shower?: Yes Elevated toilet seat or a handicapped toilet?: No  TIMED UP AND GO:  Was the test performed?  No  Cognitive Function: 6CIT completed        12/12/2023    9:39 AM 12/07/2022   10:38 AM  6CIT Screen  What Year? 0 points 0 points  What month? 0 points 0 points  What time? 0 points 0 points  Count back from 20 0 points 0 points  Months in reverse 0 points 0 points  Repeat phrase 0 points 0 points  Total Score 0 points 0 points    Immunizations Immunization History  Administered Date(s) Administered   Influenza,inj,Quad PF,6+ Mos 04/29/2013, 01/24/2018, 05/18/2019   Influenza-Unspecified 02/10/2015   Moderna Sars-Covid-2 Vaccination 10/09/2019, 11/06/2019   PPD Test 01/24/2021   Pneumococcal Polysaccharide-23 12/12/2011, 04/29/2013, 09/09/2020    Screening Tests Health Maintenance  Topic Date Due   DTaP/Tdap/Td (1 -  Tdap) Never done   Cervical Cancer Screening (HPV/Pap Cotest)  02/29/2008   MAMMOGRAM  Never done   Zoster Vaccines- Shingrix (1 of 2) Never done   Pneumococcal Vaccine: 19-49 Years (2 of 2 - PCV) 09/09/2021   Pneumococcal Vaccine: 50+ Years (2 of 2 - PCV) 09/09/2021   Colonoscopy  01/21/2022   OPHTHALMOLOGY EXAM  05/11/2022   COVID-19 Vaccine (3 - 2024-25 season) 01/06/2023   HEMOGLOBIN A1C  11/11/2023   INFLUENZA VACCINE  12/06/2023   FOOT EXAM  12/17/2023   Diabetic kidney evaluation - Urine ACR  01/31/2024   Diabetic kidney evaluation - eGFR measurement  08/05/2024   Medicare Annual Wellness (AWV)  12/11/2024   Hepatitis C Screening  Completed   HIV Screening  Completed   Hepatitis B Vaccines  Aged Out   HPV VACCINES  Aged Out   Meningococcal B Vaccine  Aged Out    Health Maintenance  Health Maintenance Due  Topic Date Due   DTaP/Tdap/Td (1 - Tdap) Never done   Cervical Cancer Screening (HPV/Pap Cotest)  02/29/2008   MAMMOGRAM  Never done   Zoster Vaccines- Shingrix (  1 of 2) Never done   Pneumococcal Vaccine: 19-49 Years (2 of 2 - PCV) 09/09/2021   Pneumococcal Vaccine: 50+ Years (2 of 2 - PCV) 09/09/2021   Colonoscopy  01/21/2022   OPHTHALMOLOGY EXAM  05/11/2022   COVID-19 Vaccine (3 - 2024-25 season) 01/06/2023   HEMOGLOBIN A1C  11/11/2023   INFLUENZA VACCINE  12/06/2023   Health Maintenance Items Addressed: Mammogram ordered, Referral sent to GI for colonoscopy  Additional Screening:  Vision Screening: Recommended annual ophthalmology exams for early detection of glaucoma and other disorders of the eye. Would you like a referral to an eye doctor? No    Dental Screening: Recommended annual dental exams for proper oral hygiene  Community Resource Referral / Chronic Care Management: CRR required this visit?  No   CCM required this visit?  No   Plan:    I have personally reviewed and noted the following in the patient's chart:   Medical and social  history Use of alcohol, tobacco or illicit drugs  Current medications and supplements including opioid prescriptions. Patient is not currently taking opioid prescriptions. Functional ability and status Nutritional status Physical activity Advanced directives List of other physicians Hospitalizations, surgeries, and ER visits in previous 12 months Vitals Screenings to include cognitive, depression, and falls Referrals and appointments  In addition, I have reviewed and discussed with patient certain preventive protocols, quality metrics, and best practice recommendations. A written personalized care plan for preventive services as well as general preventive health recommendations were provided to patient.   Rojelio LELON Blush, LPN   05/08/7972   After Visit Summary: (MyChart) Due to this being a telephonic visit, the after visit summary with patients personalized plan was offered to patient via MyChart   Notes: Nothing significant to report at this time.

## 2023-12-13 ENCOUNTER — Telehealth: Payer: Self-pay | Admitting: Family

## 2023-12-13 NOTE — Telephone Encounter (Signed)
 Called to confirm/remind patient of [facility] on [facility].   Appointment:   [x] Confirmed  [] Left mess   [] No answer/No voice mail  [] VM Full/unable to leave message  [] Phone not in service  Patient reminded to bring all medications and/or complete list.  Confirmed patient has transportation. Gave directions, instructed to utilize valet parking.

## 2023-12-16 ENCOUNTER — Encounter: Admitting: Family

## 2023-12-16 ENCOUNTER — Telehealth: Payer: Self-pay | Admitting: Family

## 2023-12-16 NOTE — Progress Notes (Deleted)
 Advanced Heart Failure Clinic Note    PCP: Antonette Angeline ORN, NP  Cardiologist: Evalene Lunger, MD   Chief Complaint:   HPI:  Ms Briere is a 58 y/o female with a history of stroke, HTN, hypokalemia, T1DM, CKD, hyperlipidemia, gout, DVT/ PE (2023) and chronic heart failure.   Echo 04/04/17: EF 20-25% with moderate LVH, Grade II DD, mild/ moderate MR Echo 09/25/17: EF 20-25% with Grade II DD, mild MR Echo 05/21/19: EF 45-50% with Grade I DD  Admitted 11/28/22 due to extensive DVT and worsened HFrEF possibly due to old anterior infarct. Reports stress fracture diagnosed 8 weeks ago and relative immobilization. CT AP (11/28/22) with partially occlusive thrombus extending from the bilateral common femoral veins, iliac vein, and infrahepatic IVC. CTA PE (7/25) with no pulmonary embolism. Started on heparin  gtt. Transitioned to Eliquis  on 7/28. Hematology consulted and recommended monoclonal gammopathy workup and outpatient hypercoagulability workup. She will need lifelong anticoagulation. 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 Creatinine 2.43 up from 0.9 likely due to contrast nephropathy as she received contrasted studies 2 days prior. UA notable for high specific gravity and ketonuria. PVL renal artery duplex and veins showed no evidence of hemodynamically significant stenosis in the renal arteries. The resistive indices are also within normal limits so the IVC DVT extending into the renal vein is not contributing to the AKI per discussion with vascular surgery.   Stress test 12/03/22: EF >60%, small, mild in severity, fixed perfusion defect involving the mid anteroseptal and mid inferoseptal segments, small to moderate (mostly posteriorly  1.4 cm) pericardial effusion  Admitted 04/06/23 due to acute onset of AMS that lasted about an hour. MRI of the brain showed small acute cortical infarct within the left insular, CT angiogram of the neck and head showed acute distal left M4 occlusion. CT angiogram of chest showed severe right sided pulmonary embolism with extension to multiple upper lobes branches.  Duplex ultrasound showed extensive left lower extremity DVT. Patient has been seen by teleneurology, started on heparin  drip. Patient is seen by vascular surgery, no need for intervention. She was also seen by hematology, she did not fail Eliquis  treatment, she was not compliant. At this point, treatment will be changed to Xarelto . Echocardiogram has been performed, ejection fraction 20 to 25% without a PFO.   Was in the ED 06/17/23 due to left great toe pain thought to possibly be gout. Glucose found to be 399 so IVF given. Gout was ruled out.  Seen in [facility] 05/25 and metoprolol  succinate 25mg  daily.   She presents today for a HF follow-up visit with a chief complaint of  NS for cMRI 06/25   Previous cardiac studies:  LHC 04/05/17: There is severe left ventricular systolic dysfunction. LV end diastolic pressure is severely elevated. The left ventricular ejection fraction is less than 25% by visual estimate.  1.  Mild nonobstructive coronary artery disease. 2.  Severely reduced LV systolic function with an ejection fraction of 15-20% with global hypokinesis. 3.  Severely elevated left ventricular end-diastolic pressure at 33 mmHg.  Chest CTA 11/29/22: No PE, small/ moderate pleural effusion  ROS: All systems negative except as listed in HPI, PMH and Problem List.  SH:  Social History   Socioeconomic History   Marital status: Single    Spouse name:  Not on file   Number of children: 3   Years of education: 12   Highest education level: 12th grade  Occupational History   Occupation: home maker  Tobacco Use   Smoking  status: Never   Smokeless tobacco: Never  Vaping Use   Vaping status: Never Used  Substance and Sexual Activity   Alcohol use: No   Drug use: No   Sexual activity: Never  Other Topics Concern   Not on file  Social History Narrative   Not on file   Social Drivers of Health   Financial Resource Strain: Low Risk  (12/12/2023)   Overall Financial Resource Strain (CARDIA)    Difficulty of Paying Living Expenses: Not hard at all  Food Insecurity: No Food Insecurity (12/12/2023)   Hunger Vital Sign    Worried About Running Out of Food in the Last Year: Never true    Ran Out of Food in the Last Year: Never true  Transportation Needs: No Transportation Needs (12/12/2023)   PRAPARE - Administrator, Civil Service (Medical): No    Lack of Transportation (Non-Medical): No  Physical Activity: Insufficiently Active (12/12/2023)   Exercise Vital Sign    Days of Exercise per Week: 1 day    Minutes of Exercise per Session: 20 min  Stress: No Stress Concern Present (12/12/2023)   [employer] of Occupational Health - Occupational Stress Questionnaire    Feeling of Stress: Not at all  Social Connections: Moderately Integrated (12/12/2023)   Social Connection and Isolation Panel    Frequency of Communication with Friends and Family: More than three times a week    Frequency of Social Gatherings with Friends and Family: More than three times a week    Attends Religious Services: More than 4 times per year    Active Member of [employer] or Organizations: Yes    Attends Banker Meetings: More than 4 times per year    Marital Status: Never married  Intimate Partner Violence: Not At Risk (12/12/2023)   Humiliation, Afraid, Rape, and Kick questionnaire    Fear of Current or Ex-Partner: No    Emotionally Abused: No    Physically Abused: No    Sexually Abused: No    FH:  Family History  Problem Relation Age of Onset   Lung cancer Mother    Hypertension Father    CAD Father         a. MI age 72   Heart disease Father    COPD Neg Hx    Diabetes Mellitus II Neg Hx     Past Medical History:  Diagnosis Date   Carpal tunnel syndrome of right wrist    Chronic combined systolic (congestive) and diastolic (congestive) heart failure (HCC)    a. 03/2017 Echo: EF 20-25%, diff HK, Gr2 DD; b. 09/2017 Echo: EF 20-25%, diff HK, Gr2 DD; c. 04/2020 Echo: EF 45-50%, mild LVH, antsept/basal ant HK, nl RV fxn; d. 11/2022 Echo: EF 25%, basal, mid & apical and antsept AK/DK.; e. 04/2023 Echo: EF 20-25%, mild conc LVH, GrI DD, nl RV fxn, small pericardial effusion.   Diabetes (HCC)    GERD (gastroesophageal reflux disease)    HLD (hyperlipidemia)    Hypertension    Lower leg DVT (deep venous thrombosis) (HCC)    NICM (nonischemic cardiomyopathy) (HCC)    a. 03/2017 Echo: EF 20-25%; b. 03/2017 Cath: mild nonobs dzs, EF 25%; c. 09/2017 Echo: EF 20-25%; d. 04/2020 Echo: EF 45-50%; e. 11/2022  Echo: EF 25%; f. 11/2022 MV: small, mild, fixed antsept/infsept defects, likely 2/2 LBBB->low risk; g. 04/2023 Echo: EF 20-25%.   Pleural effusion, left    a. 03/2017 s/p thoracentesis-->300 ml withdrawn--transudative.   Pneumonia 02/19/2017   Pulmonary embolism (HCC)    Stroke [facility])     Current Outpatient Medications  Medication Sig Dispense Refill   albuterol  (VENTOLIN  HFA) 108 (90 Base) MCG/ACT inhaler Inhale 1-2 puffs into the lungs every 6 (six) hours as needed for wheezing or shortness of breath.     Blood Glucose Monitoring Suppl DEVI 1 each by Does not apply route in the morning, at noon, and at bedtime. May substitute to any manufacturer covered by patient's insurance. 1 each 3   dapagliflozin  propanediol (FARXIGA ) 10 MG TABS tablet Take 1 tablet (10 mg total) by mouth daily before breakfast. 30 tablet 11   furosemide  (LASIX ) 40 MG tablet Take 1 tablet (40 mg total) by mouth daily. 90 tablet 3   glipiZIDE  (GLUCOTROL  XL) 10 MG 24 hr tablet Take 1 tablet (10 mg total) by mouth daily with breakfast.  90 tablet 0   hydrALAZINE  (APRESOLINE ) 25 MG tablet Take 1 tablet (25 mg total) by mouth 3 (three) times daily. 90 tablet 11   Lancets (ONETOUCH DELICA PLUS LANCET30G) MISC      loperamide  HCl (IMODIUM ) 1 MG/7.5ML solution Take 2 mg by mouth 4 (four) times daily as needed for diarrhea or loose stools.     metoprolol  succinate (TOPROL -XL) 25 MG 24 hr tablet Take 1 tablet (25 mg total) by mouth daily. 90 tablet 3   rivaroxaban  (XARELTO ) 20 MG TABS tablet Take 1 tablet (20 mg total) by mouth daily with supper. 90 tablet 3   rosuvastatin  (CRESTOR ) 20 MG tablet Take 1 tablet (20 mg total) by mouth daily. 90 tablet 3   sacubitril -valsartan  (ENTRESTO ) 97-103 MG Take 1 tablet by mouth 2 (two) times daily. 180 tablet 3   No current facility-administered medications for this visit.      PHYSICAL EXAM:  General: Well appearing in wheelchair. No resp difficulty HEENT: normal Neck: supple, no JVD Cor: Regular rhythm, rate. No rubs, gallops or murmurs Lungs: clear Abdomen: soft, nontender, nondistended. Extremities: no cyanosis, clubbing, rash, edema Neuro: alert & oriented X 3. Moves all 4 extremities w/o difficulty. Affect pleasant  ECG: not done   ASSESSMENT & PLAN:  1: NICM with reduced ejection fraction- - suspect due to HTN - NYHA class II - euvolemic - weighing daily; reminded to call for overnight weight gain of > 2 pounds or weekly weight gain of > 5 pounds - weight 110 from last visit here 2 months ago - Echo 04/04/17: EF 20-25% with moderate LVH, Grade II DD, mild/ moderate MR - Echo 09/25/17: EF 20-25% with Grade II DD, mild MR - Echo 05/21/19: EF 45-50% with Grade I DD - Echo 11/29/22: EF 25% with Grade I DD, no thrombus - Stress test 12/03/22: EF >60%, small, mild in severity, fixed perfusion defect involving the mid anteroseptal and mid inferoseptal segments, small to moderate (mostly posteriorly 1.4 cm) pericardial effusion - NS for cMRI on 10/16/23 to check for amyloid as  daughter also has HF; patient had + serum light chains 07/24 - continue farxiga  10mg  daily - continue furosemide  40mg  daily  - continue hydralazine  25mg  TID - continue metoprolol  succinate 25mg  daily - continue entresto  97/103mg  BID - consider spiro if patient is consistent with f/u visits - BNP 11/30/22 was 84  2: HTN- -  BP  - continue hydralazine  25mg  TID - saw PCP Georgina) 01/25 - BMP 08/06/23 reviewed: sodium 133, potassium 3.9, creatinine 0.97 & GFR >60  3: Hyperlipidemia- - saw cardiology Florestine) 11/24 - LDL 04/07/23 was 131 - continue rosuvastatin  20mg  daily   4: PE/DVT- - continue xarelto   20mg  daily   5: DM- - continue glipizide  XL 10mg  dail - A1c 05/14/23 was 10.4%    Ellouise Class, FNP 12/16/23

## 2023-12-16 NOTE — Telephone Encounter (Signed)
 Patient did not show for her Heart Failure Clinic appointment on 12/16/23.

## 2023-12-21 NOTE — Progress Notes (Deleted)
 Cardiology Clinic Note   Date: 12/21/2023 ID: Erin Hayes, DOB 1965-09-08, MRN [id number]  Primary Cardiologist:  Evalene Lunger, MD  Chief Complaint   Erin Hayes is a 58 y.o. female who presents to the clinic today for ***  Patient Profile   Erin Hayes is followed by Dr. Gollan for the history outlined below.      Past medical history significant for: Nonobstructive CAD. LHC 04/05/2017 (HFrEF): Mild nonobstructive CAD.  Severely reduced LV systolic function EF 15 to 20% with global hypokinesis.  Severely elevated LVEDP. Chronic HFrEF/nonischemic cardiomyopathy. Echo 04/07/2023: EF 20 to 25%.  Akinetic inferior wall, mid anteroseptal segment, mid inferior septal segment and basal inferior septal segment.  Hypokinetic mid and distal anterior wall, mid and distal lateral wall, entire apex, posterior wall and basal anteroseptal segment.  Normal anterolateral wall and basal anterior segment.  Mild concentric LVH.  Grade I DD.  Normal RV size/function.  Small pericardial effusion.  No significant valvular abnormalities.  No evidence of interatrial shunt. Hypertension. Hyperlipidemia. Lipid panel 04/07/2023: LDL 131, HDL 65, TG 106, total 217.*** T2DM. DVT/PE. Venous ultrasound 04/07/2023: Occlusive DVT within the left femoral vein, posterior tibial vein, peroneal vein.  No evidence of DVT right lower extremity. CTA chest PE protocol 04/07/2023: Marked severity right-sided pulmonary embolism with extension to involve multiple upper lobe branches.  Moderate to large pericardial effusion.  Thick curvilinear areas of low-attenuation within the lumen of the left ventricle which may represent areas of thrombus.  Mid left lower lobe and right basilar atelectasis.  CVA.  In summary, patient was first evaluated by cardiology for newly diagnosed HFrEF during hospital admission at the end of November 2018.  Echo demonstrated EF 20 to 25%, diffuse hypokinesis, Grade II DD, mild to moderate MR,  small pericardial effusion.  She underwent LHC which showed mild nonobstructive CAD.  She was started on GDMT and followed in the advanced heart failure clinic.  Follow-up echo in May 2019 showed continued LV dysfunction with EF 20 to 25%.  She was seen in the office in December 2020 prior to dental extractions.  She reported doing reasonably well with adherence to medications.  She was mostly sedentary.  Repeat echo demonstrated EF 45 to 50%.  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  She had recurrent DVT in July 2024 and was placed back on Eliquis .  No PE at that time.  Patient was last seen in the office by Dr. Gollan on 03/19/2023 for routine follow-up.  Hospital records from [facility] were reviewed from admission in July 2024.  Echo at that time showed EF back down to 25%.  She had a low risk stress test at that time showing a fixed defect most consistent with LBBB.  Patient was confused about medications at the time of her visit and it was unclear what she was taking.  It was recommended she restart Lasix , Entresto , spironolactone , Toprol .  Patient presented to the ED on 04/06/2023 with complaints of abdominal pain and general malaise.  Per family patient was also exhibiting some confusion.  Patient reported compliance with Eliquis .  MRI of the brain revealed a small acute cortical infarct in the left insula and adjacent left parietal lobe along with petechial hemorrhage at the same location.  CTA of the head and neck showed acute distal left M4 occlusion with incidental finding of acute PE in the  right main pulmonary artery and partially visualized segmental pulmonary arteries.  CT of the chest confirmed right-sided PE.  Vascular surgery did not feel she required intervention.  Echo demonstrated EF 20 to 25%.  Patient was switched to Xarelto  prior to discharge on 04/09/2023.  She has been following with advanced  heart failure clinic since discharge.  Patient was last seen by advanced heart failure clinic on 10/15/2023 for routine follow-up.  She complained of increased fatigue.  She was pending cardiac MRI to evaluate possible amyloid (positive serum light chains July 2024).  She was hypertensive at the time of her visit and reported she had not taken any of her medications.  It appears she no-showed her follow-up visit in August.  She did not undergo cardiac MRI.     History of Present Illness    Today, patient ***  Nonobstructive CAD Per angiography November 2018.  Low risk stress test performed at [facility] July 2024.  Patient*** - Continue Crestor , Toprol .  Not on aspirin  secondary to Xarelto .  Chronic HFrEF/nonischemic cardiomyopathy Echo December 2024 showed EF 20 to 25%, Grade I DD, normal RV size/function, small pericardial effusion.  Patient*** Euvolemic and well compensated on exam. - Continue Entresto , Toprol , hydralazine , furosemide , Farxiga . - Add spironolactone *** - Cardiac MRI***  Hypertension BP today*** - Continue Entresto , Toprol , hydralazine .  Hyperlipidemia LDL 131 December 2024, not at goal. - Continue rosuvastatin . - Repeat lipid panel and LFTs***  DVT/PE Occlusive DVT left lower extremity and right-sided PE December 2024 while on Eliquis .  Patient was transition to Xarelto .  Denies spontaneous bleeding concerns.  Patient*** - Continue Xarelto .  ROS: All other systems reviewed and are otherwise negative except as noted in History of Present Illness.  EKGs/Labs Reviewed        06/17/2023: ALT 31; AST 19 08/06/2023: BUN 16; Creatinine, Ser 0.97; Potassium 3.9; Sodium 133   08/06/2023: Hemoglobin 13.8; WBC 9.0   No results found for requested labs within last 365 days.   No results found for requested labs within last 365 days.  ***  Risk Assessment/Calculations    {Does this patient have ATRIAL FIBRILLATION?:[id number]} No BP recorded.  {Refresh Note OR Click here  to enter BP  :1}***        Physical Exam    VS:  There were no vitals taken for this visit. , BMI There is no height or weight on file to calculate BMI.  GEN: Well nourished, well developed, in no acute distress. Neck: No JVD or carotid bruits. Cardiac: *** RRR. *** No murmur. No rubs or gallops.   Respiratory:  Respirations regular and unlabored. Clear to auscultation without rales, wheezing or rhonchi. GI: Soft, nontender, nondistended. Extremities: Radials/DP/PT 2+ and equal bilaterally. No clubbing or cyanosis. No edema ***  Skin: Warm and dry, no rash. Neuro: Strength intact.  Assessment & Plan   ***  Disposition: ***     {Are you ordering a CV Procedure (e.g. stress test, cath, DCCV, TEE, etc)?   Press F2        :[id number]}   Signed, Barnie HERO. Emerick Weatherly, DNP, NP-C

## 2023-12-25 ENCOUNTER — Ambulatory Visit: Attending: Student | Admitting: Student

## 2023-12-25 ENCOUNTER — Other Ambulatory Visit: Payer: Self-pay | Admitting: Internal Medicine

## 2023-12-26 ENCOUNTER — Telehealth: Payer: Self-pay | Admitting: Internal Medicine

## 2023-12-26 NOTE — Telephone Encounter (Unsigned)
 Copied from CRM #[id number]. Topic: Clinical - Prescription Issue >> Dec 26, 2023  4:21 PM Deleta RAMAN wrote: Reason for CRM: wendy from [facility] in [place] is calling because patient insurance has changed/ the script she has insurance will no longer cover. Patient will need an accu- chek guide meter, accu-chek test scripts 100 count, and accu check soft clicks 100 counts lanchets.

## 2023-12-26 NOTE — Telephone Encounter (Signed)
 Requested medications are due for refill today.  yes  Requested medications are on the active medications list.  Glipizide  is - test strips are not  Last refill. Glipizide  10/24/2023 #90 0 rf, Test strips 04/11/2023  Future visit scheduled.   A wellness visit is scheduled.  Notes to clinic.  Pt has missed last 3 scheduled appts. Test strips rx was written to expire 05/11/2023.     Requested Prescriptions  Pending Prescriptions Disp Refills   glipiZIDE  (GLUCOTROL  XL) 10 MG 24 hr tablet [Pharmacy Med Name: GLIPIZIDE  ER 10 MG TABLET] 90 tablet 0    Sig: Take 1 tablet (10 mg total) by mouth daily with breakfast.     Endocrinology:  Diabetes - Sulfonylureas Failed - 12/26/2023  2:27 PM      Failed - HBA1C is between 0 and 7.9 and within 180 days    Hemoglobin A1C  Date Value Ref Range Status  05/14/2023 10.4 (A) 4.0 - 5.6 % Final   Hgb A1c MFr Bld  Date Value Ref Range Status  01/31/2023 7.8 (H) <5.7 % of total Hgb Final    Comment:    For someone without known diabetes, a hemoglobin A1c value of 6.5% or greater indicates that they may have  diabetes and this should be confirmed with a follow-up  test. . For someone with known diabetes, a value <7% indicates  that their diabetes is well controlled and a value  greater than or equal to 7% indicates suboptimal  control. A1c targets should be individualized based on  duration of diabetes, age, comorbid conditions, and  other considerations. . Currently, no consensus exists regarding use of hemoglobin A1c for diagnosis of diabetes for children. .          Passed - Cr in normal range and within 360 days    Creat  Date Value Ref Range Status  01/31/2023 0.81 0.50 - 1.03 mg/dL Final   Creatinine, Ser  Date Value Ref Range Status  08/06/2023 0.97 0.44 - 1.00 mg/dL Final   Creatinine, Urine  Date Value Ref Range Status  01/31/2023 190 20 - 275 mg/dL Final         Passed - Valid encounter within last 6 months    Recent  Outpatient Visits   None             ONETOUCH VERIO test strip [Pharmacy Med Name: ONETOUCH VERIO TEST STRIP] 90 strip 0    Sig: 1each by In Vitro route in the morning, at noon, and at bedtime. May substitute to any manufacturer covered by patient's insurance.     Endocrinology: Diabetes - Testing Supplies Passed - 12/26/2023  2:27 PM      Passed - Valid encounter within last 12 months    Recent Outpatient Visits   None

## 2023-12-27 MED ORDER — ACCU-CHEK GUIDE TEST VI STRP: ORAL_STRIP | 12 refills | Status: AC

## 2023-12-27 MED ORDER — ACCU-CHEK GUIDE W/DEVICE KIT: 1.0000 | PACK | 1 refills | Status: AC

## 2023-12-27 MED ORDER — ACCU-CHEK SOFTCLIX LANCETS MISC: 12 refills | Status: AC

## 2023-12-27 NOTE — Telephone Encounter (Signed)
 Done

## 2024-01-02 ENCOUNTER — Telehealth: Payer: Self-pay | Admitting: Family

## 2024-01-02 NOTE — Telephone Encounter (Signed)
Called to r/s missed appt

## 2024-01-15 ENCOUNTER — Ambulatory Visit

## 2024-01-17 ENCOUNTER — Telehealth: Payer: Self-pay | Admitting: Family

## 2024-01-22 ENCOUNTER — Telehealth: Payer: Self-pay | Admitting: Family

## 2024-01-22 NOTE — Progress Notes (Unsigned)
 Advanced Heart Failure Clinic Note    PCP: Antonette Angeline ORN, NP (last seen 01/25) Cardiologist: Timothy Gollan, MD (last seen 11/24)  Chief Complaint: fatigue  HPI:  Erin Hayes is a 58 y/o female with a history of stroke, HTN, hypokalemia, T1DM, CKD, hyperlipidemia, gout, DVT/ PE (2023) and chronic heart failure.   Echo 04/04/17: EF 20-25% with moderate LVH, Grade II DD, mild/ moderate MR Echo 09/25/17: EF 20-25% with Grade II DD, mild MR Echo 05/21/19: EF 45-50% with Grade I DD  Admitted 11/28/22 due to extensive DVT and worsened HFrEF possibly due to old anterior infarct. Reports stress fracture diagnosed 8 weeks ago and relative immobilization. CT AP (11/28/22) with partially occlusive thrombus extending from the bilateral common femoral veins, iliac vein, and infrahepatic IVC. CTA PE (7/25) with no pulmonary embolism. Started on heparin  gtt. Transitioned to Eliquis  on 7/28. Hematology consulted and recommended monoclonal gammopathy workup and outpatient hypercoagulability workup. She will need lifelong anticoagulation. 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 Creatinine 2.43 up from 0.9 likely due to contrast nephropathy as she received contrasted studies 2 days prior. UA notable for high specific gravity and ketonuria. PVL renal artery duplex and veins showed no evidence of hemodynamically significant stenosis in the renal arteries. The resistive indices are also within normal limits so the IVC DVT extending into the renal vein is not contributing to the AKI per discussion with vascular surgery.   Stress test 12/03/22: EF >60%, small, mild in severity, fixed perfusion defect involving the mid anteroseptal and mid inferoseptal  segments, small to moderate (mostly posteriorly 1.4 cm) pericardial effusion  Admitted 04/06/23 due to acute onset of AMS that lasted about an hour. MRI of the brain showed small acute cortical infarct within the left insular, CT angiogram of the neck and head showed acute distal left M4 occlusion. CT angiogram of chest showed severe right sided pulmonary embolism with extension to multiple upper lobes branches.  Duplex ultrasound showed extensive left lower extremity DVT. Patient has been seen by teleneurology, started on heparin  drip. Patient is seen by vascular surgery, no need for intervention. She was also seen by hematology, she did not fail Eliquis  treatment, she was not compliant. At this point, treatment will be changed to Xarelto . Echocardiogram has been performed, ejection fraction 20 to 25% without a PFO.   Was in the ED 06/17/23 due to left great toe pain thought to possibly be gout. Glucose found to be 399 so IVF given. Gout was ruled out.  Seen in [facility] 05/25 and metoprolol  succinate 25mg  daily.   She presents today for a HF follow-up visit with a chief complaint of fatigue. Sleeping well on 2 pillows. Denies shortness of breath, chest pain, palpitations, abdominal distention, pedal edema or dizziness. She did not take her medications yet this morning. Has upcoming cMRI 10/16/23 to evaluate possible amyloid.   Previous cardiac studies:  LHC 04/05/17: There is severe left ventricular systolic dysfunction. LV end diastolic pressure is severely elevated. The left ventricular ejection fraction is less than 25% by visual estimate.  1.  Mild nonobstructive coronary artery disease. 2.  Severely reduced LV systolic function with an ejection fraction of 15-20% with global hypokinesis. 3.  Severely elevated left ventricular end-diastolic pressure at 33 mmHg.  Chest CTA 11/29/22: No PE, small/ moderate  pleural effusion  ROS: All systems negative except as listed in HPI, PMH and Problem  List.  SH:  Social History   Socioeconomic History   Marital status: Single    Spouse name: Not on file   Number of children: 3   Years of education: 12   Highest education level: 12th grade  Occupational History   Occupation: home maker  Tobacco Use   Smoking status: Never   Smokeless tobacco: Never  Vaping Use   Vaping status: Never Used  Substance and Sexual Activity   Alcohol use: No   Drug use: No   Sexual activity: Never  Other Topics Concern   Not on file  Social History Narrative   Not on file   Social Drivers of Health   Financial Resource Strain: Low Risk  (12/12/2023)   Overall Financial Resource Strain (CARDIA)    Difficulty of Paying Living Expenses: Not hard at all  Food Insecurity: No Food Insecurity (12/12/2023)   Hunger Vital Sign    Worried About Running Out of Food in the Last Year: Never true    Ran Out of Food in the Last Year: Never true  Transportation Needs: No Transportation Needs (12/12/2023)   PRAPARE - Administrator, Civil Service (Medical): No    Lack of Transportation (Non-Medical): No  Physical Activity: Insufficiently Active (12/12/2023)   Exercise Vital Sign    Days of Exercise per Week: 1 day    Minutes of Exercise per Session: 20 min  Stress: No Stress Concern Present (12/12/2023)   [employer] of Occupational Health - Occupational Stress Questionnaire    Feeling of Stress: Not at all  Social Connections: Moderately Integrated (12/12/2023)   Social Connection and Isolation Panel    Frequency of Communication with Friends and Family: More than three times a week    Frequency of Social Gatherings with Friends and Family: More than three times a week    Attends Religious Services: More than 4 times per year    Active Member of [employer] or Organizations: Yes    Attends Banker Meetings: More than 4 times per year    Marital Status: Never married  Intimate Partner Violence: Not At Risk (12/12/2023)   Humiliation,  Afraid, Rape, and Kick questionnaire    Fear of Current or Ex-Partner: No    Emotionally Abused: No    Physically Abused: No    Sexually Abused: No    FH:  Family History  Problem Relation Age of Onset   Lung cancer Mother    Hypertension Father    CAD Father        a. MI age 52   Heart disease Father    COPD Neg Hx    Diabetes Mellitus II Neg Hx     Past Medical History:  Diagnosis Date   Carpal tunnel syndrome of right wrist    Chronic combined systolic (congestive) and diastolic (congestive) heart failure (HCC)    a. 03/2017 Echo: EF 20-25%, diff HK, Gr2 DD; b. 09/2017 Echo: EF 20-25%, diff HK, Gr2 DD; c. 04/2020 Echo: EF 45-50%, mild LVH, antsept/basal ant HK, nl RV fxn; d. 11/2022 Echo: EF 25%, basal, mid & apical and antsept AK/DK.; e. 04/2023 Echo: EF 20-25%, mild conc LVH, GrI DD, nl RV fxn, small pericardial effusion.   Diabetes (HCC)    GERD (gastroesophageal reflux disease)    HLD (hyperlipidemia)    Hypertension    Lower leg DVT (deep venous thrombosis) (HCC)  NICM (nonischemic cardiomyopathy) (HCC)    a. 03/2017 Echo: EF 20-25%; b. 03/2017 Cath: mild nonobs dzs, EF 25%; c. 09/2017 Echo: EF 20-25%; d. 04/2020 Echo: EF 45-50%; e. 11/2022 Echo: EF 25%; f. 11/2022 MV: small, mild, fixed antsept/infsept defects, likely 2/2 LBBB->low risk; g. 04/2023 Echo: EF 20-25%.   Pleural effusion, left    a. 03/2017 s/p thoracentesis-->300 ml withdrawn--transudative.   Pneumonia 02/19/2017   Pulmonary embolism (HCC)    Stroke [facility])     Current Outpatient Medications  Medication Sig Dispense Refill   Accu-Chek Softclix Lancets lancets Use 3 times daily 100 each 12   albuterol  (VENTOLIN  HFA) 108 (90 Base) MCG/ACT inhaler Inhale 1-2 puffs into the lungs every 6 (six) hours as needed for wheezing or shortness of breath.     Blood Glucose Monitoring Suppl (ACCU-CHEK GUIDE) w/Device KIT 1 kit by Does not apply route 3 (three) times a week. 1 kit 1   dapagliflozin  propanediol (FARXIGA )  10 MG TABS tablet Take 1 tablet (10 mg total) by mouth daily before breakfast. 30 tablet 11   furosemide  (LASIX ) 40 MG tablet Take 1 tablet (40 mg total) by mouth daily. 90 tablet 3   glipiZIDE  (GLUCOTROL  XL) 10 MG 24 hr tablet Take 1 tablet (10 mg total) by mouth daily with breakfast. 90 tablet 0   glucose blood (ACCU-CHEK GUIDE TEST) test strip Use 3 times daily 100 each 12   hydrALAZINE  (APRESOLINE ) 25 MG tablet Take 1 tablet (25 mg total) by mouth 3 (three) times daily. 90 tablet 11   loperamide  HCl (IMODIUM ) 1 MG/7.5ML solution Take 2 mg by mouth 4 (four) times daily as needed for diarrhea or loose stools.     metoprolol  succinate (TOPROL -XL) 25 MG 24 hr tablet Take 1 tablet (25 mg total) by mouth daily. 90 tablet 3   rivaroxaban  (XARELTO ) 20 MG TABS tablet Take 1 tablet (20 mg total) by mouth daily with supper. 90 tablet 3   rosuvastatin  (CRESTOR ) 20 MG tablet Take 1 tablet (20 mg total) by mouth daily. 90 tablet 3   sacubitril -valsartan  (ENTRESTO ) 97-103 MG Take 1 tablet by mouth 2 (two) times daily. 180 tablet 3   No current facility-administered medications for this visit.   There were no vitals filed for this visit.  Wt Readings from Last 3 Encounters:  12/12/23 110 lb (49.9 kg)  10/15/23 110 lb (49.9 kg)  09/10/23 111 lb 6 oz (50.5 kg)   Lab Results  Component Value Date   CREATININE 0.97 08/06/2023   CREATININE 1.13 (H) 06/17/2023   CREATININE 1.00 04/15/2023    PHYSICAL EXAM:  General: Well appearing in wheelchair. No resp difficulty HEENT: normal Neck: supple, no JVD Cor: Regular rhythm, rate. No rubs, gallops or murmurs Lungs: clear Abdomen: soft, nontender, nondistended. Extremities: no cyanosis, clubbing, rash, edema Neuro: alert & oriented X 3. Moves all 4 extremities w/o difficulty. Affect pleasant  ECG: not done   ASSESSMENT & PLAN:  1: NICM with reduced ejection fraction- - suspect due to HTN - NYHA class II - euvolemic - weighing daily; reminded to  call for overnight weight gain of > 2 pounds or weekly weight gain of > 5 pounds - weight stable from last visit here 1 month ago - Echo 04/04/17: EF 20-25% with moderate LVH, Grade II DD, mild/ moderate MR - Echo 09/25/17: EF 20-25% with Grade II DD, mild MR - Echo 05/21/19: EF 45-50% with Grade I DD - Echo 11/29/22: EF 25% with Grade I DD,  no thrombus - Stress test 12/03/22: EF >60%, small, mild in severity, fixed perfusion defect involving the mid anteroseptal and mid inferoseptal segments, small to moderate (mostly posteriorly 1.4 cm) pericardial effusion - cMRI is on 10/16/23 to check for amyloid as daughter also has HF; patient had + serum light chains 07/24 - continue farxiga  10mg  daily - continue furosemide  40mg  daily  - continue hydralazine  25mg  TID - continue metoprolol  succinate 25mg  daily - continue entresto  97/103mg  BID - consider spiro if patient is consistent with f/u visits - BNP 11/30/22 was 84  2: HTN- - BP 149/104 but she hasn't taken any of her medications yet today; encouraged to take them prior to coming to visits - continue hydralazine  25mg  TID - saw PCP Georgina) 01/25 - BMP 08/06/23 reviewed: sodium 133, potassium 3.9, creatinine 0.97 & GFR >60  3: Hyperlipidemia- - saw cardiology Florestine) 11/24 - LDL 04/07/23 was 131 - continue rosuvastatin  20mg  daily   4: PE/DVT- - continue xarelto   20mg  daily   5: DM- - continue glipizide  XL 10mg  dail - A1c 05/14/23 was 10.4%   Return in 2 months, sooner if needed.   Ellouise Class, [place] 09/10/23

## 2024-01-22 NOTE — Telephone Encounter (Signed)
 Called to confirm/remind patient of their appointment at the Advanced Heart Failure Clinic on 01/23/24.   Appointment:   [x] Confirmed  [] Left mess   [] No answer/No voice mail  [] VM Full/unable to leave message  [] Phone not in service  Patient reminded to bring all medications and/or complete list.  Confirmed patient has transportation. Gave directions, instructed to utilize valet parking.

## 2024-01-23 ENCOUNTER — Ambulatory Visit: Attending: Family | Admitting: Family

## 2024-01-23 ENCOUNTER — Encounter: Payer: Self-pay | Admitting: Family

## 2024-01-23 VITALS — BP 148/105 | HR 81 | Wt 104.0 lb

## 2024-01-23 DIAGNOSIS — I252 Old myocardial infarction: Secondary | ICD-10-CM | POA: Insufficient documentation

## 2024-01-23 DIAGNOSIS — E1022 Type 1 diabetes mellitus with diabetic chronic kidney disease: Secondary | ICD-10-CM | POA: Insufficient documentation

## 2024-01-23 DIAGNOSIS — E782 Mixed hyperlipidemia: Secondary | ICD-10-CM

## 2024-01-23 DIAGNOSIS — N189 Chronic kidney disease, unspecified: Secondary | ICD-10-CM | POA: Diagnosis not present

## 2024-01-23 DIAGNOSIS — Z8673 Personal history of transient ischemic attack (TIA), and cerebral infarction without residual deficits: Secondary | ICD-10-CM | POA: Insufficient documentation

## 2024-01-23 DIAGNOSIS — I130 Hypertensive heart and chronic kidney disease with heart failure and stage 1 through stage 4 chronic kidney disease, or unspecified chronic kidney disease: Secondary | ICD-10-CM | POA: Insufficient documentation

## 2024-01-23 DIAGNOSIS — M109 Gout, unspecified: Secondary | ICD-10-CM | POA: Insufficient documentation

## 2024-01-23 DIAGNOSIS — E785 Hyperlipidemia, unspecified: Secondary | ICD-10-CM | POA: Diagnosis not present

## 2024-01-23 DIAGNOSIS — Z86718 Personal history of other venous thrombosis and embolism: Secondary | ICD-10-CM | POA: Insufficient documentation

## 2024-01-23 DIAGNOSIS — Z79899 Other long term (current) drug therapy: Secondary | ICD-10-CM | POA: Diagnosis not present

## 2024-01-23 DIAGNOSIS — Z91148 Patient's other noncompliance with medication regimen for other reason: Secondary | ICD-10-CM | POA: Diagnosis not present

## 2024-01-23 DIAGNOSIS — Z794 Long term (current) use of insulin: Secondary | ICD-10-CM | POA: Diagnosis not present

## 2024-01-23 DIAGNOSIS — Z7984 Long term (current) use of oral hypoglycemic drugs: Secondary | ICD-10-CM | POA: Insufficient documentation

## 2024-01-23 DIAGNOSIS — I825Y9 Chronic embolism and thrombosis of unspecified deep veins of unspecified proximal lower extremity: Secondary | ICD-10-CM

## 2024-01-23 DIAGNOSIS — I5022 Chronic systolic (congestive) heart failure: Secondary | ICD-10-CM

## 2024-01-23 DIAGNOSIS — Z86711 Personal history of pulmonary embolism: Secondary | ICD-10-CM | POA: Diagnosis not present

## 2024-01-23 DIAGNOSIS — I428 Other cardiomyopathies: Secondary | ICD-10-CM | POA: Diagnosis present

## 2024-01-23 DIAGNOSIS — I10 Essential (primary) hypertension: Secondary | ICD-10-CM

## 2024-01-23 DIAGNOSIS — I5042 Chronic combined systolic (congestive) and diastolic (congestive) heart failure: Secondary | ICD-10-CM | POA: Diagnosis not present

## 2024-01-23 DIAGNOSIS — E119 Type 2 diabetes mellitus without complications: Secondary | ICD-10-CM

## 2024-01-23 DIAGNOSIS — Z7901 Long term (current) use of anticoagulants: Secondary | ICD-10-CM | POA: Insufficient documentation

## 2024-01-23 NOTE — Patient Instructions (Signed)
 Medication Changes:  We have refilled all of your medications to [employer] [employer] order.   Lab Work:  Go downstairs to [place] on LOWER LEVEL to have your blood work completed.  We will only call you if the results are abnormal or if the provider would like to make medication changes.  No news is good news.   Testing/Procedures:  We are rescheduling your Cardiac MRI. It is October 15th at 10:30 AM. You need to check in at the [facility] for this.   Special Instructions // Education:  It was good to see you today!   Follow-Up in: 1 month with Ellouise Class, FNP.   Thank you for choosing [facility] Failure [facility].    At the Advanced Heart Failure Clinic, you and your health needs are our priority. We have a designated team specialized in the treatment of Heart Failure. This Care Team includes your primary Heart Failure Specialized Cardiologist (physician), Advanced Practice Providers (APPs- Physician Assistants and Nurse Practitioners), and Pharmacist who all work together to provide you with the care you need, when you need it.   You may see any of the following providers on your designated Care Team at your next follow up:  Dr. Toribio Fuel Dr. Ezra Shuck Dr. Ria Commander Dr. Morene Brownie Ellouise Class, FNP Jaun Bash, RPH-CPP  Please be sure to bring in all your medications bottles to every appointment.   Need to Contact Us :  If you have any questions or concerns before your next appointment please send us  a message through [place] or call our office at [phone].    TO LEAVE A MESSAGE FOR THE NURSE SELECT OPTION 2, PLEASE LEAVE A MESSAGE INCLUDING: YOUR NAME DATE OF BIRTH CALL BACK NUMBER REASON FOR CALL**this is important as we prioritize the call backs  YOU WILL RECEIVE A CALL BACK THE SAME DAY AS LONG AS YOU CALL BEFORE 4:00 PM

## 2024-01-24 ENCOUNTER — Ambulatory Visit: Admitting: Cardiovascular Disease

## 2024-01-24 NOTE — Progress Notes (Deleted)
 Cardiology Office Note  Date:  01/24/2024   ID:  Erin Hayes, DOB [birth year], MRN [id number]  PCP:  Antonette Angeline ORN, NP   No chief complaint on file.   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 Admission to the hospital July 2024 recurrent DVT, weakness Partially occlusive thrombus extending into bilateral common femoral veins, iliac vein, infrahepatic IVC CTA PE study with no pulmonary embolism was started on heparin  transition to Eliquis   Echocardiogram with EF 25%, 1.6 cm aneurysmal segment mid left ventricular septum, Underwent Myoview which was low risk Was started on Lipitor 80 (previously on Crestor )  Creatinine 2.43 up from 0.9 felt secondary to contrast nephropathy as she received contrast studies at [facility] Nephrology consulted during admission, Entresto /losartan , spironolactone  held Plan was to restart these as an outpatient  In follow-up today, some medication confusion, did not bring list with her of what she is taking Lipitor and Crestor  both on her list Lasix  is not on her list Hydralazine  25 once a day, Entresto   back on her list with spironolactone , unclear if she is taking  Lab work reviewed A1C 7.8 Total chol 160, 79 Creatinine 0.81 in January 31, 2023  Continues to wear boot on her left ankle/foot for stress fracture  February 2023 closed displaced fracture left femoral neck Ultrasound Sep 06, 2021 DVT Ultrasound November 24, 2021 positive for DVT lower extremity Completed Eliquis  5 mg twice daily for 6 months 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 Left ventricular ejection fraction, by visual estimation, is 45 to  50%. The left ventricle has normal function. There is mildly increased  left ventricular hypertrophy.   Prior medical records reviewed on today's visit  ED 02/04/17 due to pneumonia  Admitted 02/18/17 due to LLL pneumonia. Initially needed IV antibiotics & then transitioned to oral antibiotics. Discharged  Admitted 04/03/17 due to acute HF.   IV diuretics  echo and  catheterization by Dr.  Darron showing nonischemic cardiomyopathy ejection fraction less than 25%   Recently she reports she has been doing well, denies any lower extremity edema or shortness of breath or abdominal bloating Weight has been stable, Carvedilol  is on her list but she does not have this medication No significant change in her weight Feels almost back to normal   cardiac catheterization November 2018 Ejection fraction 25% or less, mild nonobstructive coronary disease, left ventricular end-diastolic pressure 33   PMH:   has a past medical history of Carpal tunnel syndrome of right wrist, Chronic combined systolic (congestive) and diastolic (congestive) heart failure (HCC), Diabetes (  HCC), GERD (gastroesophageal reflux disease), HLD (hyperlipidemia), Hypertension, Lower leg DVT (deep venous thrombosis) (HCC), NICM (nonischemic cardiomyopathy) (HCC), Pleural effusion, left, Pneumonia  (02/19/2017), Pulmonary embolism (HCC), and Stroke (HCC).  PSH:    Past Surgical History:  Procedure Laterality Date   COLONOSCOPY WITH PROPOFOL  N/A 01/21/2017   Procedure: COLONOSCOPY WITH PROPOFOL ;  Surgeon: Therisa Bi, MD;  Location: [facility] ENDOSCOPY;  Service: Gastroenterology;  Laterality: N/A;   ESOPHAGOGASTRODUODENOSCOPY (EGD) WITH PROPOFOL  N/A 01/21/2017   Procedure: ESOPHAGOGASTRODUODENOSCOPY (EGD) WITH PROPOFOL ;  Surgeon: Therisa Bi, MD;  Location: [facility] ENDOSCOPY;  Service: Gastroenterology;  Laterality: N/A;   FLEXIBLE SIGMOIDOSCOPY N/A 11/04/2016   Procedure: FLEXIBLE SIGMOIDOSCOPY;  Surgeon: Dianna Specking, MD;  Location: [facility] ENDOSCOPY;  Service: Endoscopy;  Laterality: N/A;   HIP PINNING,CANNULATED Left 06/19/2021   Procedure: CANNULATED HIP PINNING;  Surgeon: Rollene Cough, MD;  Location: [facility] ORS;  Service: Orthopedics;  Laterality: Left;   LEFT HEART CATH AND CORONARY ANGIOGRAPHY N/A 04/05/2017   Procedure: LEFT HEART CATH AND CORONARY ANGIOGRAPHY;  Surgeon: Darron Deatrice LABOR, MD;  Location: [facility] INVASIVE CV LAB;  Service: Cardiovascular;  Laterality: N/A;   LITHOTRIPSY     Current Outpatient Medications on File Prior to Visit  Medication Sig Dispense Refill   Accu-Chek Softclix Lancets lancets Use 3 times daily 100 each 12   albuterol  (VENTOLIN  HFA) 108 (90 Base) MCG/ACT inhaler Inhale 1-2 puffs into the lungs every 6 (six) hours as needed for wheezing or shortness of breath.     Blood Glucose Monitoring Suppl (ACCU-CHEK GUIDE) w/Device KIT 1 kit by Does not apply route 3 (three) times a week. 1 kit 1   dapagliflozin  propanediol (FARXIGA ) 10 MG TABS tablet Take 1 tablet (10 mg total) by mouth daily before breakfast. 30 tablet 11   furosemide  (LASIX ) 40 MG tablet Take 1 tablet (40 mg total) by mouth daily. 90 tablet 3   glipiZIDE  (GLUCOTROL  XL) 10 MG 24 hr tablet Take 1 tablet (10 mg total) by mouth daily with breakfast. 90 tablet 0   glucose blood (ACCU-CHEK GUIDE TEST)  test strip Use 3 times daily 100 each 12   hydrALAZINE  (APRESOLINE ) 25 MG tablet Take 1 tablet (25 mg total) by mouth 3 (three) times daily. 90 tablet 11   loperamide  HCl (IMODIUM ) 1 MG/7.5ML solution Take 2 mg by mouth 4 (four) times daily as needed for diarrhea or loose stools.     metoprolol  succinate (TOPROL -XL) 25 MG 24 hr tablet Take 1 tablet (25 mg total) by mouth daily. 90 tablet 3   rivaroxaban  (XARELTO ) 20 MG TABS tablet Take 1 tablet (20 mg total) by mouth daily with supper. (Patient not taking: Reported on 01/23/2024) 90 tablet 3   rosuvastatin  (CRESTOR ) 20 MG tablet Take 1 tablet (20 mg total) by mouth daily. 90 tablet 3   sacubitril -valsartan  (ENTRESTO ) 97-103 MG Take 1 tablet by mouth 2 (two) times daily. 180 tablet 3   No current facility-administered medications on file prior to visit.    Allergies:   Patient has no known allergies.   Social History:  The patient  reports that she has never smoked. She has never used smokeless tobacco. She reports that she does not drink alcohol and does not use drugs.   Family History:   family history includes CAD in her father; Heart disease in her father; Hypertension in her father; Lung cancer in her mother.    Review of Systems: Review of Systems  Constitutional: Negative.   Respiratory: Negative.    Cardiovascular: Negative.  Gastrointestinal: Negative.   Musculoskeletal: Negative.   Neurological: Negative.   Psychiatric/Behavioral: Negative.    All other systems reviewed and are negative.   PHYSICAL EXAM: VS:  There were no vitals taken for this visit. , BMI There is no height or weight on file to calculate BMI. Constitutional:  oriented to person, place, and time. No distress.  HENT:  Head: Grossly normal Eyes:  no discharge. No scleral icterus.  Neck: No JVD, no carotid bruits  Cardiovascular: Regular rate and rhythm, no murmurs appreciated Pulmonary/Chest: Clear to auscultation bilaterally, no wheezes or  rails Abdominal: Soft.  no distension.  no tenderness.  Musculoskeletal: Normal range of motion Neurological:  normal muscle tone. Coordination normal. No atrophy Skin: Skin warm and dry Psychiatric: normal affect, pleasant  Recent Labs: 04/08/2023: Magnesium  1.7 06/17/2023: ALT 31 08/06/2023: BUN 16; Creatinine, Ser 0.97; Hemoglobin 13.8; Platelets 261; Potassium 3.9; Sodium 133    Lipid Panel Lab Results  Component Value Date   CHOL 217 (H) 04/07/2023   HDL 65 04/07/2023   LDLCALC 131 (H) 04/07/2023   TRIG 106 04/07/2023      Wt Readings from Last 3 Encounters:  01/23/24 104 lb (47.2 kg)  12/12/23 110 lb (49.9 kg)  10/15/23 110 lb (49.9 kg)     ASSESSMENT AND PLAN:  Chronic systolic congestive heart failure (HCC) -  Echocardiogram 45 to 50% in January 2021 Echocardiogram performed November 29, 2022 [facility] EF estimated 25% Medications held at discharge from [facility] She did not bring medication list with her We have recommended she restart Lasix  40 daily, Entresto  97/103 twice daily, spironolactone  25 daily, metoprolol  succinate 25 daily  Type 2 diabetes mellitus without complication, without long-term current use of insulin  (HCC)  hemoglobin A1c mildly elevated Stressed importance of calorie restriction Unable to exercise at this time  NICM (nonischemic cardiomyopathy) (HCC) EF 45 to 50% in 2021 Down to 25% July 2024 Restart medications as above  Essential hypertension Blood pressure elevated, recommend she restart medications as above Suggest she bring her medications with her on next clinic visit or at least an updated list  Left lower extremity DVT Recurrent DVTs in 2023, July 2024 Left foot with stress fracture in a boot, on Eliquis  5 twice daily  Hyperlipidemia Her list has both Lipitor and Crestor , recommend she stop the Lipitor and take Crestor  20 daily which she was taking previously She will call us  to clarify    No orders of the defined types were placed in  this encounter.    Signed, Velinda Lunger, M.D., Ph.D. 01/24/2024  [facility] [place], [place] [phone]

## 2024-01-26 NOTE — Progress Notes (Deleted)
 NO SHOW

## 2024-01-27 ENCOUNTER — Ambulatory Visit: Attending: Cardiovascular Disease | Admitting: Cardiovascular Disease

## 2024-01-27 DIAGNOSIS — E119 Type 2 diabetes mellitus without complications: Secondary | ICD-10-CM

## 2024-01-27 DIAGNOSIS — I825Y9 Chronic embolism and thrombosis of unspecified deep veins of unspecified proximal lower extremity: Secondary | ICD-10-CM

## 2024-01-27 DIAGNOSIS — I10 Essential (primary) hypertension: Secondary | ICD-10-CM

## 2024-01-27 DIAGNOSIS — E782 Mixed hyperlipidemia: Secondary | ICD-10-CM

## 2024-01-27 DIAGNOSIS — I428 Other cardiomyopathies: Secondary | ICD-10-CM

## 2024-01-27 DIAGNOSIS — I5022 Chronic systolic (congestive) heart failure: Secondary | ICD-10-CM

## 2024-01-29 ENCOUNTER — Ambulatory Visit: Admitting: Student

## 2024-02-16 ENCOUNTER — Other Ambulatory Visit: Payer: Self-pay | Admitting: Internal Medicine

## 2024-02-18 NOTE — Telephone Encounter (Signed)
 Requested medications are due for refill today.  yes  Requested medications are on the active medications list.  yes  Last refill. 12/26/2023 #90 0 rf  Future visit scheduled.   Yes - 12/2024  Notes to clinic.  Labs are expired    Requested Prescriptions  Pending Prescriptions Disp Refills   glipiZIDE  (GLUCOTROL  XL) 10 MG 24 hr tablet [Pharmacy Med Name: glipiZIDE  ER 10 MG Oral Tablet Extended Release 24 Hour] 90 tablet 3    Sig: TAKE 1 TABLET BY MOUTH DAILY  WITH BREAKFAST     Endocrinology:  Diabetes - Sulfonylureas Failed - 02/18/2024  3:28 PM      Failed - HBA1C is between 0 and 7.9 and within 180 days    Hemoglobin A1C  Date Value Ref Range Status  05/14/2023 10.4 (A) 4.0 - 5.6 % Final   Hgb A1c MFr Bld  Date Value Ref Range Status  01/31/2023 7.8 (H) <5.7 % of total Hgb Final    Comment:    For someone without known diabetes, a hemoglobin A1c value of 6.5% or greater indicates that they may have  diabetes and this should be confirmed with a follow-up  test. . For someone with known diabetes, a value <7% indicates  that their diabetes is well controlled and a value  greater than or equal to 7% indicates suboptimal  control. A1c targets should be individualized based on  duration of diabetes, age, comorbid conditions, and  other considerations. . Currently, no consensus exists regarding use of hemoglobin A1c for diagnosis of diabetes for children. .          Passed - Cr in normal range and within 360 days    Creat  Date Value Ref Range Status  01/31/2023 0.81 0.50 - 1.03 mg/dL Final   Creatinine, Ser  Date Value Ref Range Status  08/06/2023 0.97 0.44 - 1.00 mg/dL Final   Creatinine, Urine  Date Value Ref Range Status  01/31/2023 190 20 - 275 mg/dL Final         Passed - Valid encounter within last 6 months    Recent Outpatient Visits   None

## 2024-02-19 ENCOUNTER — Ambulatory Visit: Admission: RE | Admit: 2024-02-19 | Source: Ambulatory Visit

## 2024-02-27 ENCOUNTER — Telehealth: Payer: Self-pay | Admitting: Family

## 2024-02-27 NOTE — Telephone Encounter (Signed)
 Called to confirm/remind patient of their appointment at the [facility] on 02/28/24.   Appointment:   [] Confirmed  [x] Left mess   [] No answer/No voice mail  [] VM Full/unable to leave message  [] Phone not in service  Patient reminded to bring all medications and/or complete list.  Confirmed patient has transportation. Gave directions, instructed to utilize valet parking.

## 2024-02-27 NOTE — Progress Notes (Deleted)
 [facility] Note    PCP: Antonette Angeline ORN, NP  Cardiologist: Evalene Lunger, MD   Chief Complaint:    HPI:  Erin Hayes is a 58 y/o female with a history of stroke, HTN, hypokalemia, T1DM, CKD, hyperlipidemia, gout, DVT/ PE (2023) and chronic heart failure.   Echo 04/04/17: EF 20-25% with moderate LVH, Grade II DD, mild/ moderate MR Echo 09/25/17: EF 20-25% with Grade II DD, mild MR Echo 05/21/19: EF 45-50% with Grade I DD  Admitted 11/28/22 due to extensive DVT and worsened HFrEF possibly due to old anterior infarct. Reports stress fracture diagnosed 8 weeks ago and relative immobilization. CT AP (11/28/22) with partially occlusive thrombus extending from the bilateral common femoral veins, iliac vein, and infrahepatic IVC. CTA PE (7/25) with no pulmonary embolism. Started on heparin  gtt. Transitioned to Eliquis  on 7/28. Hematology consulted and recommended monoclonal gammopathy workup and outpatient hypercoagulability workup. She will need lifelong anticoagulation. 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 Creatinine 2.43 up from 0.9 likely due to contrast nephropathy as she received contrasted studies 2 days prior. UA notable for high specific gravity and ketonuria. PVL renal artery duplex and veins showed no evidence of hemodynamically significant stenosis in the renal arteries. The resistive indices are also within normal limits so the IVC DVT extending into the renal vein is not contributing to the AKI per discussion with vascular surgery.   Stress test 12/03/22: EF >60%, small, mild in severity, fixed perfusion defect involving the mid anteroseptal and mid inferoseptal segments, small to moderate (mostly posteriorly  1.4 cm) pericardial effusion  Admitted 04/06/23 due to acute onset of AMS that lasted about an hour. MRI of the brain showed small acute cortical infarct within the left insular, CT angiogram of the neck and head showed acute distal left M4 occlusion. CT angiogram of chest showed severe right sided pulmonary embolism with extension to multiple upper lobes branches.  Duplex ultrasound showed extensive left lower extremity DVT. Patient has been seen by teleneurology, started on heparin  drip. Patient is seen by vascular surgery, no need for intervention. She was also seen by hematology, she did not fail Eliquis  treatment, she was not compliant. At this point, treatment will be changed to Xarelto . Echocardiogram has been performed, ejection fraction 20 to 25% without a PFO.   Was in the ED 06/17/23 due to left great toe pain thought to possibly be gout. Glucose found to be 399 so IVF given. Gout was ruled out.  Seen in [facility] 05/25 and metoprolol  succinate 25mg  daily.   She presents today for a HF follow-up visit with a chief complaint of   Previous cardiac studies:  LHC 04/05/17: There is severe left ventricular systolic dysfunction. LV end diastolic pressure is severely elevated. The left ventricular ejection fraction is less than 25% by visual estimate.  1.  Mild nonobstructive coronary artery disease. 2.  Severely reduced LV systolic function with an ejection fraction of 15-20% with global hypokinesis. 3.  Severely elevated left ventricular end-diastolic pressure at 33 mmHg.  Chest CTA 11/29/22: No PE, small/ moderate pleural effusion  ROS: All systems negative except as listed in HPI, PMH and Problem List.  SH:  Social History   Socioeconomic History   Marital status: Single    Spouse name: Not on file  Number of children: 3   Years of education: 12   Highest education level: 12th grade  Occupational History   Occupation: home maker  Tobacco Use   Smoking status: Never   Smokeless  tobacco: Never  Vaping Use   Vaping status: Never Used  Substance and Sexual Activity   Alcohol use: No   Drug use: No   Sexual activity: Never  Other Topics Concern   Not on file  Social History Narrative   Not on file   Social Drivers of Health   Financial Resource Strain: Low Risk  (12/12/2023)   Overall Financial Resource Strain (CARDIA)    Difficulty of Paying Living Expenses: Not hard at all  Food Insecurity: No Food Insecurity (12/12/2023)   Hunger Vital Sign    Worried About Running Out of Food in the Last Year: Never true    Ran Out of Food in the Last Year: Never true  Transportation Needs: No Transportation Needs (12/12/2023)   PRAPARE - Administrator, Civil Service (Medical): No    Lack of Transportation (Non-Medical): No  Physical Activity: Insufficiently Active (12/12/2023)   Exercise Vital Sign    Days of Exercise per Week: 1 day    Minutes of Exercise per Session: 20 min  Stress: No Stress Concern Present (12/12/2023)   [employer] of Occupational Health - Occupational Stress Questionnaire    Feeling of Stress: Not at all  Social Connections: Moderately Integrated (12/12/2023)   Social Connection and Isolation Panel    Frequency of Communication with Friends and Family: More than three times a week    Frequency of Social Gatherings with Friends and Family: More than three times a week    Attends Religious Services: More than 4 times per year    Active Member of [employer] or Organizations: Yes    Attends Banker Meetings: More than 4 times per year    Marital Status: Never married  Intimate Partner Violence: Not At Risk (12/12/2023)   Humiliation, Afraid, Rape, and Kick questionnaire    Fear of Current or Ex-Partner: No    Emotionally Abused: No    Physically Abused: No    Sexually Abused: No    FH:  Family History  Problem Relation Age of Onset   Lung cancer Mother    Hypertension Father    CAD Father        a. MI age 17   Heart  disease Father    COPD Neg Hx    Diabetes Mellitus II Neg Hx     Past Medical History:  Diagnosis Date   Carpal tunnel syndrome of right wrist    Chronic combined systolic (congestive) and diastolic (congestive) heart failure (HCC)    a. 03/2017 Echo: EF 20-25%, diff HK, Gr2 DD; b. 09/2017 Echo: EF 20-25%, diff HK, Gr2 DD; c. 04/2020 Echo: EF 45-50%, mild LVH, antsept/basal ant HK, nl RV fxn; d. 11/2022 Echo: EF 25%, basal, mid & apical and antsept AK/DK.; e. 04/2023 Echo: EF 20-25%, mild conc LVH, GrI DD, nl RV fxn, small pericardial effusion.   Diabetes (HCC)    GERD (gastroesophageal reflux disease)    HLD (hyperlipidemia)    Hypertension    Lower leg DVT (deep venous thrombosis) (HCC)    NICM (nonischemic cardiomyopathy) (HCC)    a. 03/2017 Echo: EF 20-25%; b. 03/2017 Cath: mild nonobs dzs, EF 25%; c. 09/2017 Echo: EF 20-25%; d. 04/2020 Echo: EF 45-50%; e. 11/2022 Echo: EF 25%; f. 11/2022  MV: small, mild, fixed antsept/infsept defects, likely 2/2 LBBB->low risk; g. 04/2023 Echo: EF 20-25%.   Pleural effusion, left    a. 03/2017 s/p thoracentesis-->300 ml withdrawn--transudative.   Pneumonia 02/19/2017   Pulmonary embolism (HCC)    Stroke [facility])     Current Outpatient Medications  Medication Sig Dispense Refill   Accu-Chek Softclix Lancets lancets Use 3 times daily 100 each 12   albuterol  (VENTOLIN  HFA) 108 (90 Base) MCG/ACT inhaler Inhale 1-2 puffs into the lungs every 6 (six) hours as needed for wheezing or shortness of breath.     Blood Glucose Monitoring Suppl (ACCU-CHEK GUIDE) w/Device KIT 1 kit by Does not apply route 3 (three) times a week. 1 kit 1   dapagliflozin  propanediol (FARXIGA ) 10 MG TABS tablet Take 1 tablet (10 mg total) by mouth daily before breakfast. 30 tablet 11   furosemide  (LASIX ) 40 MG tablet Take 1 tablet (40 mg total) by mouth daily. 90 tablet 3   glipiZIDE  (GLUCOTROL  XL) 10 MG 24 hr tablet Take 1 tablet (10 mg total) by mouth daily with breakfast. 90 tablet 0    glucose blood (ACCU-CHEK GUIDE TEST) test strip Use 3 times daily 100 each 12   hydrALAZINE  (APRESOLINE ) 25 MG tablet Take 1 tablet (25 mg total) by mouth 3 (three) times daily. 90 tablet 11   loperamide  HCl (IMODIUM ) 1 MG/7.5ML solution Take 2 mg by mouth 4 (four) times daily as needed for diarrhea or loose stools.     metoprolol  succinate (TOPROL -XL) 25 MG 24 hr tablet Take 1 tablet (25 mg total) by mouth daily. 90 tablet 3   rivaroxaban  (XARELTO ) 20 MG TABS tablet Take 1 tablet (20 mg total) by mouth daily with supper. (Patient not taking: Reported on 01/23/2024) 90 tablet 3   rosuvastatin  (CRESTOR ) 20 MG tablet Take 1 tablet (20 mg total) by mouth daily. 90 tablet 3   sacubitril -valsartan  (ENTRESTO ) 97-103 MG Take 1 tablet by mouth 2 (two) times daily. 180 tablet 3   No current facility-administered medications for this visit.      PHYSICAL EXAM:  General: Well appearing.  Cor: No JVD. Regular rhythm, rate.  Lungs: clear Abdomen: soft, nontender, nondistended. Extremities: no edema Neuro:. Affect pleasant   ECG: not done   ASSESSMENT & PLAN:  1: NICM with reduced ejection fraction- - suspect due to HTN - NYHA class II - euvolemic - weight 140 pounds from last visit here 1 month ago - Echo 04/04/17: EF 20-25% with moderate LVH, Grade II DD, mild/ moderate MR - Echo 09/25/17: EF 20-25% with Grade II DD, mild MR - Echo 05/21/19: EF 45-50% with Grade I DD - Echo 11/29/22: EF 25% with Grade I DD, no thrombus - Stress test 12/03/22: EF >60%, small, mild in severity, fixed perfusion defect involving the mid anteroseptal and mid inferoseptal segments, small to moderate (mostly posteriorly 1.4 cm) pericardial effusion - NS for cMRI on 10/16/23  & again 02/19/24 to check for amyloid as daughter also has HF; patient had + serum light chains 07/24.  - continue farxiga  10mg  daily - continue furosemide  40mg  daily  - continue hydralazine  25mg  TID - continue metoprolol  succinate 25mg  daily -  continue entresto  97/103mg  BID - consider spiro if patient is consistent with f/u visits - BNP 11/30/22 was 84  2: HTN- - BP  - continue hydralazine  25mg  TID - saw PCP Georgina) 01/25 - BMP 08/06/23 reviewed: sodium 133, potassium 3.9, creatinine 0.97 & GFR >60 - BMET today  3: Hyperlipidemia- -  saw cardiology Florestine) 11/24 - LDL 04/07/23 was 131 - continue rosuvastatin  20mg  daily  - lipid panel today  4: PE/DVT- - continue xarelto   20mg  daily   5: DM- - continue glipizide  XL 10mg  dail - A1c 05/14/23 was 10.4%       Erin Hayes Class 02/27/24

## 2024-02-28 ENCOUNTER — Telehealth: Payer: Self-pay | Admitting: Family

## 2024-02-28 ENCOUNTER — Encounter: Admitting: Family

## 2024-02-28 NOTE — Telephone Encounter (Signed)
 Patient did not show for her [facility] appointment on 02/28/24.

## 2024-03-03 ENCOUNTER — Other Ambulatory Visit: Payer: Self-pay | Admitting: Internal Medicine

## 2024-03-05 NOTE — Telephone Encounter (Signed)
 Requested medication (s) are due for refill today: yes  Requested medication (s) are on the active medication list: yes  Last refill:  12/26/23  Future visit scheduled: no  Notes to clinic:  Unable to refill per protocol due to failed labs, no updated  A1Cresults.      Requested Prescriptions  Pending Prescriptions Disp Refills   glipiZIDE  (GLUCOTROL  XL) 10 MG 24 hr tablet [Pharmacy Med Name: glipiZIDE  ER 10 MG Oral Tablet Extended Release 24 Hour] 90 tablet 3    Sig: TAKE 1 TABLET BY MOUTH DAILY  WITH BREAKFAST     Endocrinology:  Diabetes - Sulfonylureas Failed - 03/05/2024  1:37 PM      Failed - HBA1C is between 0 and 7.9 and within 180 days    Hemoglobin A1C  Date Value Ref Range Status  05/14/2023 10.4 (A) 4.0 - 5.6 % Final   Hgb A1c MFr Bld  Date Value Ref Range Status  01/31/2023 7.8 (H) <5.7 % of total Hgb Final    Comment:    For someone without known diabetes, a hemoglobin A1c value of 6.5% or greater indicates that they may have  diabetes and this should be confirmed with a follow-up  test. . For someone with known diabetes, a value <7% indicates  that their diabetes is well controlled and a value  greater than or equal to 7% indicates suboptimal  control. A1c targets should be individualized based on  duration of diabetes, age, comorbid conditions, and  other considerations. . Currently, no consensus exists regarding use of hemoglobin A1c for diagnosis of diabetes for children. .          Passed - Cr in normal range and within 360 days    Creat  Date Value Ref Range Status  01/31/2023 0.81 0.50 - 1.03 mg/dL Final   Creatinine, Ser  Date Value Ref Range Status  08/06/2023 0.97 0.44 - 1.00 mg/dL Final   Creatinine, Urine  Date Value Ref Range Status  01/31/2023 190 20 - 275 mg/dL Final         Passed - Valid encounter within last 6 months    Recent Outpatient Visits   None

## 2024-03-26 ENCOUNTER — Encounter: Payer: Self-pay | Admitting: Internal Medicine

## 2024-04-06 LAB — OPHTHALMOLOGY REPORT-SCANNED

## 2024-12-25 ENCOUNTER — Ambulatory Visit

## 2024-12-30 ENCOUNTER — Ambulatory Visit
